# Patient Record
Sex: Female | Born: 1935
Health system: Southern US, Community
[De-identification: ages and names within clinical notes are randomized; demographics above are authoritative.]

## PROBLEM LIST (undated history)

## (undated) DIAGNOSIS — K219 Gastro-esophageal reflux disease without esophagitis: Secondary | ICD-10-CM

## (undated) DIAGNOSIS — Z9289 Personal history of other medical treatment: Secondary | ICD-10-CM

## (undated) DIAGNOSIS — E039 Hypothyroidism, unspecified: Secondary | ICD-10-CM

## (undated) DIAGNOSIS — E785 Hyperlipidemia, unspecified: Secondary | ICD-10-CM

## (undated) DIAGNOSIS — I10 Essential (primary) hypertension: Secondary | ICD-10-CM

## (undated) DIAGNOSIS — R053 Chronic cough: Secondary | ICD-10-CM

## (undated) DIAGNOSIS — I499 Cardiac arrhythmia, unspecified: Secondary | ICD-10-CM

## (undated) DIAGNOSIS — I209 Angina pectoris, unspecified: Secondary | ICD-10-CM

## (undated) DIAGNOSIS — J45909 Unspecified asthma, uncomplicated: Secondary | ICD-10-CM

## (undated) DIAGNOSIS — I480 Paroxysmal atrial fibrillation: Secondary | ICD-10-CM

## (undated) DIAGNOSIS — R05 Cough: Secondary | ICD-10-CM

## (undated) DIAGNOSIS — J189 Pneumonia, unspecified organism: Secondary | ICD-10-CM

## (undated) HISTORY — DX: Essential (primary) hypertension: I10

## (undated) HISTORY — PX: TONSILLECTOMY: SHX5217

## (undated) HISTORY — PX: FOOT SURGERY: SHX648

## (undated) HISTORY — PX: TONSILLECTOMY: SUR1361

## (undated) HISTORY — PX: THYROID SURGERY: SHX805

## (undated) HISTORY — DX: Cough: R05

## (undated) HISTORY — DX: Gastro-esophageal reflux disease without esophagitis: K21.9

## (undated) HISTORY — PX: PACEMAKER INSERTION: SHX728

## (undated) HISTORY — DX: Paroxysmal atrial fibrillation: I48.0

## (undated) HISTORY — DX: Personal history of other medical treatment: Z92.89

## (undated) HISTORY — DX: Chronic cough: R05.3

## (undated) HISTORY — PX: CHOLECYSTECTOMY: SHX55

## (undated) HISTORY — PX: NASAL SEPTUM SURGERY: SHX37

## (undated) HISTORY — DX: Hyperlipidemia, unspecified: E78.5

## (undated) HISTORY — PX: VESICOVAGINAL FISTULA CLOSURE W/ TAH: SUR271

## (undated) HISTORY — DX: Unspecified asthma, uncomplicated: J45.909

---

## 2001-12-23 ENCOUNTER — Encounter: Payer: Self-pay | Admitting: Emergency Medicine

## 2001-12-23 ENCOUNTER — Emergency Department (HOSPITAL_COMMUNITY): Admission: EM | Admit: 2001-12-23 | Discharge: 2001-12-23 | Payer: Self-pay | Admitting: Emergency Medicine

## 2004-08-03 ENCOUNTER — Ambulatory Visit: Payer: Self-pay | Admitting: Internal Medicine

## 2004-08-31 ENCOUNTER — Ambulatory Visit: Payer: Self-pay | Admitting: Internal Medicine

## 2004-09-02 ENCOUNTER — Ambulatory Visit: Payer: Self-pay | Admitting: Internal Medicine

## 2004-09-25 ENCOUNTER — Ambulatory Visit: Payer: Self-pay | Admitting: Internal Medicine

## 2004-10-15 ENCOUNTER — Emergency Department (HOSPITAL_COMMUNITY): Admission: EM | Admit: 2004-10-15 | Discharge: 2004-10-15 | Payer: Self-pay | Admitting: Emergency Medicine

## 2004-10-22 ENCOUNTER — Ambulatory Visit (HOSPITAL_COMMUNITY): Admission: RE | Admit: 2004-10-22 | Discharge: 2004-10-22 | Payer: Self-pay

## 2004-11-25 ENCOUNTER — Ambulatory Visit: Payer: Self-pay | Admitting: Internal Medicine

## 2004-12-25 ENCOUNTER — Ambulatory Visit: Payer: Self-pay | Admitting: Internal Medicine

## 2005-02-23 ENCOUNTER — Ambulatory Visit: Payer: Self-pay | Admitting: Internal Medicine

## 2005-03-23 ENCOUNTER — Ambulatory Visit: Payer: Self-pay | Admitting: Internal Medicine

## 2005-03-24 ENCOUNTER — Ambulatory Visit: Payer: Self-pay | Admitting: Internal Medicine

## 2005-03-31 ENCOUNTER — Ambulatory Visit: Payer: Self-pay | Admitting: Internal Medicine

## 2005-04-05 ENCOUNTER — Ambulatory Visit: Payer: Self-pay | Admitting: Internal Medicine

## 2005-04-08 ENCOUNTER — Ambulatory Visit: Payer: Self-pay | Admitting: Internal Medicine

## 2005-04-19 ENCOUNTER — Ambulatory Visit: Payer: Self-pay | Admitting: Internal Medicine

## 2005-04-22 ENCOUNTER — Ambulatory Visit: Payer: Self-pay | Admitting: Internal Medicine

## 2005-04-28 ENCOUNTER — Ambulatory Visit: Payer: Self-pay | Admitting: Internal Medicine

## 2005-05-05 ENCOUNTER — Ambulatory Visit: Payer: Self-pay | Admitting: Internal Medicine

## 2005-05-10 ENCOUNTER — Ambulatory Visit: Payer: Self-pay | Admitting: Internal Medicine

## 2005-05-17 ENCOUNTER — Ambulatory Visit: Payer: Self-pay | Admitting: Internal Medicine

## 2005-05-21 ENCOUNTER — Ambulatory Visit: Payer: Self-pay | Admitting: Internal Medicine

## 2005-05-24 ENCOUNTER — Ambulatory Visit: Payer: Self-pay | Admitting: Internal Medicine

## 2005-05-31 ENCOUNTER — Ambulatory Visit: Payer: Self-pay | Admitting: Internal Medicine

## 2005-06-03 ENCOUNTER — Ambulatory Visit: Payer: Self-pay | Admitting: *Deleted

## 2005-06-04 ENCOUNTER — Ambulatory Visit: Payer: Self-pay | Admitting: Internal Medicine

## 2005-06-08 ENCOUNTER — Ambulatory Visit: Payer: Self-pay | Admitting: Internal Medicine

## 2005-06-09 ENCOUNTER — Ambulatory Visit: Payer: Self-pay | Admitting: Internal Medicine

## 2005-06-11 ENCOUNTER — Ambulatory Visit: Payer: Self-pay | Admitting: Internal Medicine

## 2005-06-16 ENCOUNTER — Ambulatory Visit: Payer: Self-pay | Admitting: Internal Medicine

## 2005-06-23 ENCOUNTER — Ambulatory Visit: Payer: Self-pay | Admitting: Internal Medicine

## 2005-06-29 ENCOUNTER — Ambulatory Visit: Payer: Self-pay | Admitting: Internal Medicine

## 2005-07-02 ENCOUNTER — Ambulatory Visit: Payer: Self-pay | Admitting: Internal Medicine

## 2005-07-07 ENCOUNTER — Ambulatory Visit: Payer: Self-pay | Admitting: Internal Medicine

## 2005-07-14 ENCOUNTER — Ambulatory Visit: Payer: Self-pay | Admitting: Internal Medicine

## 2005-07-16 ENCOUNTER — Ambulatory Visit: Payer: Self-pay | Admitting: Internal Medicine

## 2005-07-21 ENCOUNTER — Ambulatory Visit: Payer: Self-pay | Admitting: Internal Medicine

## 2005-07-27 ENCOUNTER — Ambulatory Visit: Payer: Self-pay | Admitting: Internal Medicine

## 2005-08-17 ENCOUNTER — Ambulatory Visit: Payer: Self-pay | Admitting: Internal Medicine

## 2005-08-20 ENCOUNTER — Ambulatory Visit: Payer: Self-pay | Admitting: Internal Medicine

## 2005-08-25 ENCOUNTER — Ambulatory Visit: Payer: Self-pay | Admitting: Internal Medicine

## 2005-08-31 ENCOUNTER — Ambulatory Visit: Payer: Self-pay | Admitting: Internal Medicine

## 2005-09-07 ENCOUNTER — Ambulatory Visit: Payer: Self-pay | Admitting: Internal Medicine

## 2005-09-10 ENCOUNTER — Ambulatory Visit: Payer: Self-pay | Admitting: Internal Medicine

## 2005-09-14 ENCOUNTER — Ambulatory Visit: Payer: Self-pay | Admitting: Internal Medicine

## 2005-09-21 ENCOUNTER — Ambulatory Visit: Payer: Self-pay | Admitting: Internal Medicine

## 2005-09-23 ENCOUNTER — Ambulatory Visit: Payer: Self-pay | Admitting: Internal Medicine

## 2005-09-28 ENCOUNTER — Ambulatory Visit: Payer: Self-pay | Admitting: Internal Medicine

## 2005-10-01 ENCOUNTER — Ambulatory Visit: Payer: Self-pay | Admitting: Internal Medicine

## 2005-10-11 ENCOUNTER — Ambulatory Visit: Payer: Self-pay | Admitting: Internal Medicine

## 2005-11-02 ENCOUNTER — Ambulatory Visit: Payer: Self-pay | Admitting: Internal Medicine

## 2005-11-10 ENCOUNTER — Ambulatory Visit: Payer: Self-pay | Admitting: Internal Medicine

## 2005-11-17 ENCOUNTER — Ambulatory Visit: Payer: Self-pay | Admitting: Internal Medicine

## 2005-11-19 ENCOUNTER — Ambulatory Visit: Payer: Self-pay | Admitting: Internal Medicine

## 2005-11-25 ENCOUNTER — Ambulatory Visit: Payer: Self-pay | Admitting: Internal Medicine

## 2005-11-30 ENCOUNTER — Ambulatory Visit: Payer: Self-pay | Admitting: Internal Medicine

## 2005-12-03 ENCOUNTER — Ambulatory Visit: Payer: Self-pay | Admitting: Internal Medicine

## 2005-12-08 ENCOUNTER — Ambulatory Visit: Payer: Self-pay | Admitting: Internal Medicine

## 2005-12-10 ENCOUNTER — Ambulatory Visit: Payer: Self-pay | Admitting: Internal Medicine

## 2005-12-13 ENCOUNTER — Ambulatory Visit: Payer: Self-pay | Admitting: Internal Medicine

## 2005-12-17 ENCOUNTER — Ambulatory Visit: Payer: Self-pay | Admitting: Internal Medicine

## 2005-12-21 ENCOUNTER — Ambulatory Visit: Payer: Self-pay | Admitting: Internal Medicine

## 2005-12-23 ENCOUNTER — Encounter: Admission: RE | Admit: 2005-12-23 | Discharge: 2005-12-23 | Payer: Self-pay | Admitting: Obstetrics and Gynecology

## 2005-12-24 ENCOUNTER — Ambulatory Visit: Payer: Self-pay | Admitting: Internal Medicine

## 2005-12-31 ENCOUNTER — Ambulatory Visit: Payer: Self-pay | Admitting: Internal Medicine

## 2006-01-04 ENCOUNTER — Ambulatory Visit: Payer: Self-pay | Admitting: Internal Medicine

## 2006-01-10 ENCOUNTER — Ambulatory Visit: Payer: Self-pay | Admitting: Internal Medicine

## 2006-01-19 ENCOUNTER — Ambulatory Visit: Payer: Self-pay | Admitting: Internal Medicine

## 2006-01-25 ENCOUNTER — Ambulatory Visit: Payer: Self-pay | Admitting: Internal Medicine

## 2006-02-08 ENCOUNTER — Ambulatory Visit: Payer: Self-pay | Admitting: Internal Medicine

## 2006-02-15 ENCOUNTER — Ambulatory Visit: Payer: Self-pay | Admitting: Internal Medicine

## 2006-03-01 ENCOUNTER — Ambulatory Visit: Payer: Self-pay | Admitting: Internal Medicine

## 2006-03-02 ENCOUNTER — Ambulatory Visit: Payer: Self-pay | Admitting: Internal Medicine

## 2006-03-11 ENCOUNTER — Ambulatory Visit: Payer: Self-pay | Admitting: Internal Medicine

## 2006-03-16 ENCOUNTER — Ambulatory Visit: Payer: Self-pay | Admitting: Internal Medicine

## 2006-03-25 ENCOUNTER — Ambulatory Visit: Payer: Self-pay | Admitting: Internal Medicine

## 2006-03-28 ENCOUNTER — Ambulatory Visit: Payer: Self-pay | Admitting: Internal Medicine

## 2006-03-31 ENCOUNTER — Ambulatory Visit (HOSPITAL_COMMUNITY): Admission: RE | Admit: 2006-03-31 | Discharge: 2006-03-31 | Payer: Self-pay | Admitting: Internal Medicine

## 2006-04-06 ENCOUNTER — Ambulatory Visit: Payer: Self-pay | Admitting: Internal Medicine

## 2006-04-13 ENCOUNTER — Encounter: Admission: RE | Admit: 2006-04-13 | Discharge: 2006-04-13 | Payer: Self-pay | Admitting: Obstetrics and Gynecology

## 2006-04-14 ENCOUNTER — Ambulatory Visit: Payer: Self-pay | Admitting: Internal Medicine

## 2006-04-28 ENCOUNTER — Ambulatory Visit: Payer: Self-pay | Admitting: Internal Medicine

## 2006-05-05 ENCOUNTER — Ambulatory Visit: Payer: Self-pay | Admitting: Internal Medicine

## 2006-05-10 ENCOUNTER — Ambulatory Visit: Payer: Self-pay | Admitting: Internal Medicine

## 2006-05-25 ENCOUNTER — Ambulatory Visit: Payer: Self-pay | Admitting: Internal Medicine

## 2006-06-03 ENCOUNTER — Ambulatory Visit: Payer: Self-pay | Admitting: Internal Medicine

## 2006-06-10 ENCOUNTER — Ambulatory Visit: Payer: Self-pay | Admitting: Internal Medicine

## 2006-07-05 ENCOUNTER — Ambulatory Visit: Payer: Self-pay | Admitting: Internal Medicine

## 2006-07-06 ENCOUNTER — Ambulatory Visit: Payer: Self-pay | Admitting: Internal Medicine

## 2006-07-15 ENCOUNTER — Ambulatory Visit: Payer: Self-pay | Admitting: Internal Medicine

## 2006-07-28 ENCOUNTER — Ambulatory Visit: Payer: Self-pay | Admitting: Internal Medicine

## 2006-08-19 ENCOUNTER — Ambulatory Visit: Payer: Self-pay | Admitting: Internal Medicine

## 2006-09-15 ENCOUNTER — Ambulatory Visit: Payer: Self-pay | Admitting: Internal Medicine

## 2006-09-23 ENCOUNTER — Ambulatory Visit: Payer: Self-pay | Admitting: Internal Medicine

## 2006-10-14 ENCOUNTER — Ambulatory Visit: Payer: Self-pay | Admitting: Internal Medicine

## 2006-10-27 ENCOUNTER — Ambulatory Visit: Payer: Self-pay | Admitting: Internal Medicine

## 2006-11-11 ENCOUNTER — Ambulatory Visit: Payer: Self-pay | Admitting: Internal Medicine

## 2006-12-08 ENCOUNTER — Ambulatory Visit: Payer: Self-pay | Admitting: Internal Medicine

## 2006-12-29 ENCOUNTER — Ambulatory Visit: Payer: Self-pay | Admitting: Internal Medicine

## 2007-02-01 ENCOUNTER — Ambulatory Visit: Payer: Self-pay | Admitting: Internal Medicine

## 2007-03-20 ENCOUNTER — Ambulatory Visit: Payer: Self-pay | Admitting: Internal Medicine

## 2007-03-28 ENCOUNTER — Ambulatory Visit: Payer: Self-pay | Admitting: Internal Medicine

## 2007-05-12 ENCOUNTER — Ambulatory Visit: Payer: Self-pay | Admitting: Internal Medicine

## 2007-05-19 ENCOUNTER — Ambulatory Visit: Payer: Self-pay | Admitting: Internal Medicine

## 2007-05-24 ENCOUNTER — Encounter: Admission: RE | Admit: 2007-05-24 | Discharge: 2007-05-24 | Payer: Self-pay | Admitting: Endocrinology

## 2007-05-25 ENCOUNTER — Ambulatory Visit: Payer: Self-pay | Admitting: Internal Medicine

## 2007-06-20 ENCOUNTER — Ambulatory Visit: Payer: Self-pay | Admitting: Internal Medicine

## 2007-06-27 ENCOUNTER — Ambulatory Visit: Payer: Self-pay | Admitting: Internal Medicine

## 2007-07-07 ENCOUNTER — Ambulatory Visit: Payer: Self-pay | Admitting: Internal Medicine

## 2007-07-14 ENCOUNTER — Ambulatory Visit: Payer: Self-pay | Admitting: Internal Medicine

## 2007-07-21 ENCOUNTER — Ambulatory Visit: Payer: Self-pay | Admitting: Internal Medicine

## 2007-07-28 ENCOUNTER — Ambulatory Visit: Payer: Self-pay | Admitting: Internal Medicine

## 2007-07-31 ENCOUNTER — Ambulatory Visit: Payer: Self-pay | Admitting: Internal Medicine

## 2007-08-04 ENCOUNTER — Ambulatory Visit: Payer: Self-pay | Admitting: Internal Medicine

## 2007-08-18 ENCOUNTER — Ambulatory Visit: Payer: Self-pay | Admitting: Internal Medicine

## 2007-09-22 ENCOUNTER — Ambulatory Visit: Payer: Self-pay | Admitting: Internal Medicine

## 2007-10-13 ENCOUNTER — Ambulatory Visit: Payer: Self-pay | Admitting: Internal Medicine

## 2007-10-20 ENCOUNTER — Ambulatory Visit: Payer: Self-pay | Admitting: Internal Medicine

## 2007-10-26 ENCOUNTER — Encounter: Payer: Self-pay | Admitting: Internal Medicine

## 2007-10-26 DIAGNOSIS — J309 Allergic rhinitis, unspecified: Secondary | ICD-10-CM | POA: Insufficient documentation

## 2007-10-26 DIAGNOSIS — K219 Gastro-esophageal reflux disease without esophagitis: Secondary | ICD-10-CM | POA: Insufficient documentation

## 2007-10-26 DIAGNOSIS — R05 Cough: Secondary | ICD-10-CM

## 2007-10-26 DIAGNOSIS — R053 Chronic cough: Secondary | ICD-10-CM | POA: Insufficient documentation

## 2007-10-26 DIAGNOSIS — J209 Acute bronchitis, unspecified: Secondary | ICD-10-CM | POA: Insufficient documentation

## 2007-11-03 ENCOUNTER — Inpatient Hospital Stay (HOSPITAL_COMMUNITY): Admission: EM | Admit: 2007-11-03 | Discharge: 2007-11-04 | Payer: Self-pay | Admitting: Emergency Medicine

## 2007-11-17 ENCOUNTER — Ambulatory Visit: Payer: Self-pay | Admitting: Internal Medicine

## 2007-11-19 ENCOUNTER — Inpatient Hospital Stay (HOSPITAL_COMMUNITY): Admission: EM | Admit: 2007-11-19 | Discharge: 2007-11-21 | Payer: Self-pay | Admitting: Emergency Medicine

## 2007-11-20 ENCOUNTER — Encounter (INDEPENDENT_AMBULATORY_CARE_PROVIDER_SITE_OTHER): Payer: Self-pay | Admitting: Cardiology

## 2007-11-24 ENCOUNTER — Inpatient Hospital Stay (HOSPITAL_COMMUNITY): Admission: EM | Admit: 2007-11-24 | Discharge: 2007-11-28 | Payer: Self-pay | Admitting: Emergency Medicine

## 2007-11-24 ENCOUNTER — Encounter (INDEPENDENT_AMBULATORY_CARE_PROVIDER_SITE_OTHER): Payer: Self-pay | Admitting: Cardiology

## 2007-11-29 ENCOUNTER — Telehealth (INDEPENDENT_AMBULATORY_CARE_PROVIDER_SITE_OTHER): Payer: Self-pay | Admitting: *Deleted

## 2007-12-08 ENCOUNTER — Ambulatory Visit: Payer: Self-pay | Admitting: Internal Medicine

## 2007-12-18 ENCOUNTER — Ambulatory Visit: Payer: Self-pay | Admitting: Internal Medicine

## 2007-12-26 ENCOUNTER — Ambulatory Visit: Payer: Self-pay | Admitting: Internal Medicine

## 2008-01-31 ENCOUNTER — Ambulatory Visit: Payer: Self-pay | Admitting: Internal Medicine

## 2008-02-09 ENCOUNTER — Ambulatory Visit: Payer: Self-pay | Admitting: Internal Medicine

## 2008-02-16 ENCOUNTER — Ambulatory Visit: Payer: Self-pay | Admitting: Internal Medicine

## 2008-02-23 ENCOUNTER — Ambulatory Visit: Payer: Self-pay | Admitting: Internal Medicine

## 2008-03-01 ENCOUNTER — Ambulatory Visit: Payer: Self-pay | Admitting: Internal Medicine

## 2008-03-15 ENCOUNTER — Ambulatory Visit: Payer: Self-pay | Admitting: Internal Medicine

## 2008-03-29 ENCOUNTER — Ambulatory Visit: Payer: Self-pay | Admitting: Internal Medicine

## 2008-05-06 ENCOUNTER — Ambulatory Visit: Payer: Self-pay | Admitting: Internal Medicine

## 2008-05-07 ENCOUNTER — Ambulatory Visit: Payer: Self-pay | Admitting: Internal Medicine

## 2008-05-23 ENCOUNTER — Ambulatory Visit: Payer: Self-pay | Admitting: Internal Medicine

## 2008-08-02 ENCOUNTER — Ambulatory Visit: Payer: Self-pay | Admitting: Internal Medicine

## 2008-08-16 ENCOUNTER — Ambulatory Visit: Payer: Self-pay | Admitting: Internal Medicine

## 2008-08-28 ENCOUNTER — Ambulatory Visit: Payer: Self-pay | Admitting: Internal Medicine

## 2008-09-09 ENCOUNTER — Ambulatory Visit: Payer: Self-pay | Admitting: Internal Medicine

## 2008-09-30 ENCOUNTER — Ambulatory Visit: Payer: Self-pay | Admitting: Internal Medicine

## 2008-11-04 ENCOUNTER — Ambulatory Visit: Payer: Self-pay | Admitting: Internal Medicine

## 2008-11-11 ENCOUNTER — Ambulatory Visit: Payer: Self-pay | Admitting: Internal Medicine

## 2008-11-19 ENCOUNTER — Ambulatory Visit: Payer: Self-pay | Admitting: Internal Medicine

## 2008-12-13 ENCOUNTER — Ambulatory Visit: Payer: Self-pay | Admitting: Internal Medicine

## 2009-01-14 ENCOUNTER — Ambulatory Visit: Payer: Self-pay | Admitting: Internal Medicine

## 2009-01-24 ENCOUNTER — Ambulatory Visit: Payer: Self-pay | Admitting: Internal Medicine

## 2009-02-04 ENCOUNTER — Ambulatory Visit: Payer: Self-pay | Admitting: Internal Medicine

## 2009-02-12 ENCOUNTER — Ambulatory Visit: Payer: Self-pay | Admitting: Internal Medicine

## 2009-02-20 ENCOUNTER — Telehealth: Payer: Self-pay | Admitting: Internal Medicine

## 2009-02-21 ENCOUNTER — Ambulatory Visit: Payer: Self-pay | Admitting: Internal Medicine

## 2009-03-06 ENCOUNTER — Ambulatory Visit: Payer: Self-pay | Admitting: Internal Medicine

## 2009-03-31 ENCOUNTER — Encounter: Admission: RE | Admit: 2009-03-31 | Discharge: 2009-03-31 | Payer: Self-pay | Admitting: Endocrinology

## 2009-04-04 ENCOUNTER — Ambulatory Visit: Payer: Self-pay | Admitting: Internal Medicine

## 2009-04-16 ENCOUNTER — Ambulatory Visit: Payer: Self-pay | Admitting: Internal Medicine

## 2009-05-02 ENCOUNTER — Ambulatory Visit: Payer: Self-pay | Admitting: Vascular Surgery

## 2009-05-27 ENCOUNTER — Ambulatory Visit: Payer: Self-pay | Admitting: Internal Medicine

## 2009-09-10 ENCOUNTER — Ambulatory Visit: Payer: Self-pay | Admitting: Internal Medicine

## 2009-09-19 ENCOUNTER — Ambulatory Visit: Payer: Self-pay | Admitting: Internal Medicine

## 2009-10-03 ENCOUNTER — Ambulatory Visit: Payer: Self-pay | Admitting: Internal Medicine

## 2009-10-31 ENCOUNTER — Ambulatory Visit: Payer: Self-pay | Admitting: Internal Medicine

## 2009-11-14 ENCOUNTER — Ambulatory Visit: Payer: Self-pay | Admitting: Internal Medicine

## 2009-11-18 ENCOUNTER — Telehealth (INDEPENDENT_AMBULATORY_CARE_PROVIDER_SITE_OTHER): Payer: Self-pay | Admitting: *Deleted

## 2009-11-20 DIAGNOSIS — Z9289 Personal history of other medical treatment: Secondary | ICD-10-CM

## 2009-11-20 HISTORY — DX: Personal history of other medical treatment: Z92.89

## 2009-12-04 ENCOUNTER — Ambulatory Visit: Payer: Self-pay | Admitting: Internal Medicine

## 2010-01-01 ENCOUNTER — Telehealth (INDEPENDENT_AMBULATORY_CARE_PROVIDER_SITE_OTHER): Payer: Self-pay | Admitting: *Deleted

## 2010-01-02 ENCOUNTER — Ambulatory Visit: Payer: Self-pay | Admitting: Internal Medicine

## 2010-01-05 ENCOUNTER — Telehealth: Payer: Self-pay | Admitting: Internal Medicine

## 2010-01-22 ENCOUNTER — Encounter: Payer: Self-pay | Admitting: Internal Medicine

## 2010-02-11 ENCOUNTER — Ambulatory Visit: Payer: Self-pay | Admitting: Internal Medicine

## 2010-02-20 ENCOUNTER — Ambulatory Visit: Payer: Self-pay | Admitting: Internal Medicine

## 2010-02-20 DIAGNOSIS — J45909 Unspecified asthma, uncomplicated: Secondary | ICD-10-CM

## 2010-03-12 ENCOUNTER — Ambulatory Visit: Payer: Self-pay | Admitting: Internal Medicine

## 2010-04-01 ENCOUNTER — Encounter: Admission: RE | Admit: 2010-04-01 | Discharge: 2010-04-01 | Payer: Self-pay | Admitting: Endocrinology

## 2010-04-16 ENCOUNTER — Ambulatory Visit: Payer: Self-pay | Admitting: Internal Medicine

## 2010-04-27 ENCOUNTER — Ambulatory Visit: Payer: Self-pay | Admitting: Internal Medicine

## 2010-05-13 ENCOUNTER — Ambulatory Visit: Payer: Self-pay | Admitting: Internal Medicine

## 2010-05-25 ENCOUNTER — Ambulatory Visit: Payer: Self-pay | Admitting: Internal Medicine

## 2010-07-20 ENCOUNTER — Ambulatory Visit: Payer: Self-pay | Admitting: Internal Medicine

## 2010-07-23 NOTE — Letter (Signed)
Summary: [facility]   Imported By: Sherian Rein 01/30/2010 07:52:59  _____________________________________________________________________  External Attachment:    Type:   Image     Comment:   External Document

## 2010-07-23 NOTE — Progress Notes (Signed)
Summary: cough  Phone Note Call from Patient   Caller: Patient Call For: young Summary of Call: pt still have cough and its getting worse Initial call taken by: Rickard Patience,  January 01, 2010 4:04 PM  Follow-up for Phone Call        Spoke with pt.  She states that since she was last seen in May her cough has worsened progressively every day.  She states that she tried taking omeprazole two times a day as directed per CD[facility] and this did not help at all.  She states that she is currrently on dexilant daily per her PCP and this is not helping.  She has appt on 7/26 but wants to be seen sooner.  Appt sched for tommorrow at 3:30 pm. Follow-up by: Vernie Murders,  January 01, 2010 4:23 PM

## 2010-07-23 NOTE — Miscellaneous (Signed)
Summary: Orders Update pft charges  Clinical Lists Changes  Orders: Added new Service order of Carbon Monoxide diffusing w/capacity (94720) - Signed Added new Service order of Lung Volumes (94240) - Signed Added new Service order of Spirometry (Pre & Post) (94060) - Signed 

## 2010-07-23 NOTE — Progress Notes (Signed)
Summary: returned call  Phone Note Call from Patient Call back at Home Phone [phone]   Caller: Patient Call For: young Summary of Call: pt returned call from [facility] Initial call taken by: Tivis Ringer, CNA,  Nov 18, 2009 12:02 PM  Follow-up for Phone Call        pt called back to say she is at home now. Follow-up by: Eugene Gavia,  Nov 18, 2009 4:41 PM  Additional Follow-up for Phone Call Additional follow up Details #1::        Spoke with pt; aware of results and no questions or concerns.Reynaldo Minium CMA  Nov 18, 2009 4:44 PM

## 2010-07-23 NOTE — Assessment & Plan Note (Signed)
Summary: f/u results///kp   Primary Provider/Referring Provider:  Altheimer  CC:  ov to discuss spirometry results.  History of Present Illness: Nov 14, 2009- Cough, allergic rhinitis, asthma/ bronchitis, GERD............................[name] Kitchenhusband here 77 yo never smoker for 1 yr f/u. did well til 4-5 months ago. Began burping when up to bathrrom at night. In AM notes burning in throat. Much cough, scant production- never looks at it  Not clear about any association with meals and denies choke, strangle, hangup or won't go down sensation. HOB up on brick. Not more SOB and no wheeze or chest pain. Dr Altheimer gave script for acid reflux med- couldn't afford. No change with heart. Had sleep study by her heart doctor. Cough starts upper trachea level- treated w/ cough drops.  Allergy vaccine continues - she says it helps, prevented her previous Fall symptoms, and is ok.  January 02, 2010- Cough, allergic rhinitis, asthma/ bronchjitis, GERD...........[name] Kitchenhusband here Cough has been worse since last here  Family is concerned. Feels something coming up in throat as she gets up; also around 5pm and after dinner. Rarely able to actually cough anything out. Feels tickle in lower throat but never coughs up anything.  Denies sinus drip. She thinks it comes up from below,  but can't tell if it is from stomach or from lung. Continues Dexilant. Little cough at night. CXR- Reviewed with them: pacemaker, borderline heart size, but no CHF. Old calcified scarring.  February 20, 2010- Cough, allergic rhinitis, asthma/ bronchitis, GERD............[name] Kitchenhusband here Chronic cough- we gave Daliresp to try and got PFT Daliresp made nauseated and anorexic, so she quit and resumed Nexium. Not coughing as badly but still occasionally bad throat tickle sets her off. She has no theories. Is aware ragweed season has often been bad for her. Not aware of dyspnea. . Had endoscopy by Dr Evette Cristal- told her no damage from reflux. Allergy  vaccine dose is held at highest tolerated- .05 of 1:50. PFT reviewed- Mild obstruction small airways with response to dilator and mild reduction of DLCO Wants to wait till later for flu vax.      Preventive Screening-Counseling & Management  Alcohol-Tobacco     Smoking Status: never  Current Medications (verified): 1)  Adult Aspirin Low Strength 81 Mg  Tbdp (Aspirin) .... Once Daily 2)  Evista 60 Mg  Tabs (Raloxifene Hcl) .... Take 1 Tablet By Mouth Once A Day 3)  Synthroid 75 Mcg  Tabs (Levothyroxine Sodium) .[name].. 1 Tab By Mouth 3 Times A Week 4)  Vitamin D 1000 Unit  Tabs (Cholecalciferol) .... Take 1 Tablet By Mouth Once A Day 5)  Multivitamins   Tabs (Multiple Vitamin) .... Take 1 Tablet By Mouth Once A Day 6)  Cal-Mag 500-250 Mg  Tabs (Calcium-Magnesium) .... Take 1 Tablet By Mouth Once A Day 7)  Allergy Vaccine A 1:500, B 1:50(0.05) Gh .... Gh 8)  Simvastatin 20 Mg Tabs (Simvastatin) .... Take 1/2 By Mouth Once Daily 9)  Diltiazem Hcl 120 Mg Tabs (Diltiazem Hcl) .... Take 1 By Mouth Once Daily 10)  Valturna 300-320 Mg Tabs (Aliskiren-Valsartan) .... Once Daily 11)  Hydrochlorothiazide 50 Mg Tabs (Hydrochlorothiazide) .... 1/2 Tab Every Day 12)  Metoprolol Succinate 50 Mg Xr24h-Tab (Metoprolol Succinate) .... 2 By Mouth Once Daily 13)  Nexium 40 Mg Cpdr (Esomeprazole Magnesium) .... Take 1 Capsule By Mouth Once A Day Before Meal 14)  Daliresp 500 .[name] KitchenMarland [name]. 1 Daily 15)  Vitamin D (Ergocalciferol) 50000 Unit Caps (Ergocalciferol) .[name].. 1 Capsule By Mouth Every  2 Weeks  Allergies (verified): 1)  ! * Mycins 2)  ! * Advair 3)  ! Sulfa  Past History:  Past Surgical History: Last updated: 05/23/2008 Right Foot Thyroid Surgery Hysterectomy Gallbladder Tonsils Septoplasty Surgery  Family History: Last updated: 2010/03/05 Mother- died MI age 46 Father- died bladder cancer Sister- alive, hx MI  Social History: Last updated: 03/05/2010 Patient never smoked.   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  The patient denies shortness of breath with activity, shortness of breath at rest, productive cough, coughing up blood, chest pain, irregular heartbeats, acid heartburn, indigestion, loss of appetite, weight change, abdominal pain, difficulty swallowing, sore throat, tooth/dental problems, headaches, nasal congestion/difficulty breathing through nose, and sneezing.    Vital Signs:  Patient profile:   77 year old female Height:      59 inches Weight:      165.50 pounds BMI:     33.55 O2 Sat:      96 % on Room air Pulse rate:   60 / minute BP sitting:   98 / 56  (left arm) Cuff size:   regular  Vitals Entered By: Boone Master CNA/MA 03/05/2010 1:40 PM)  O2 Flow:  Room air CC: ov to discuss spirometry results Comments Medications reviewed with patient Daytime contact number verified with patient. Boone Master CNA/MA  March 05, 2010 1:39 PM    Physical Exam  Additional Exam:  General: A/Ox3; pleasant and cooperative, NAD, SKIN: no rash, lesions NODES: no lymphadenopathy HEENT: /AT, EOM- WNL, Conjuctivae- clear, PERRLA, TM-WNL, Nose- clear, Throat- clear and wnl, Mallampati  II, not red, no drainage NECK: Supple w/ fair ROM, JVD- none, normal carotid impulses w/o bruits Thyroid- , no stridor, not hoarse CHEST: Clear to P&A, unlabored. NO cough after taking deep  breath,  no spontaneous cough, wheeze or hoarseness otherwise. HEART: RRR, no m/g/r heard ABDOMEN. obese ZOX:WRUE, nl pulses, no edema  NEURO: Grossly intact to observation      Impression & Recommendations:  Problem # 1:  COUGH, CHRONIC (ICD-786.2)  For lack of a better explanation, this cough is probably multifactorial with components of reflux and of cough equivalent asthma. Dr Evette Cristal has told her she doesn't have esophageal damage. We reviewed her past lack of benefit from Spiriva. We will let her try sample [facility] for the high steroid level possible.  Problem # 2:  ALLERGIC RHINITIS (ICD-477.9)  She continues allergy vaccine and has felt it helped nasal symptoms. We discussed options. She reacts to higher doses and will hold at highest tolerated level.  Problem # 3:  ESOPHAGEAL REFLUX (ICD-530.81)  She remains on antihreflux regimine nd Nexium. Her updated medication list for this problem includes:    Nexium 40 Mg Cpdr (Esomeprazole magnesium) .[name]... Take 1 capsule by mouth once a day before meal  Medications Added to Medication List This Visit: 1)  Synthroid 75 Mcg Tabs (Levothyroxine sodium) .[name].. 1 tab by mouth 3 times a week 2)  Simvastatin 20  Mg Tabs (Simvastatin) .... Take 1/2 by mouth once daily 3)  Nexium 40 Mg Cpdr (Esomeprazole magnesium) .... Take 1 capsule by mouth once a day before meal 4)  Vitamin D (ergocalciferol) 50000 Unit Caps (Ergocalciferol) .[name].. 1 capsule by mouth every 2 weeks 5)  Dulera 200-5 Mcg/act Aero (Mometasone furo-formoterol fum) .... 2 puffs and rinse mouth, twice daily  Other Orders: Est. Patient Level IV (57846)  Patient Instructions: 1)  Please schedule a follow-up appointment in 6 months. 2)  Try sample Dulera 200-5, 2 puffs and rinse mouth, twice daily. if it helps call for script. Prescriptions: DULERA 200-5 MCG/ACT AERO (MOMETASONE FURO-FORMOTEROL FUM) 2 puffs and rinse mouth, twice daily  #1 x prn   Entered and  Authorized by:   Waymon Budge MD   Signed by:   Waymon Budge MD on 02/20/2010   Method used:   Samples Given   RxID:   [id number]    Immunization History:  Influenza Immunization History:    Influenza:  historical (02/19/2009)  Pneumovax Immunization History:    Pneumovax:  historical (06/22/2007)

## 2010-07-23 NOTE — Miscellaneous (Signed)
Summary: Injection Record / [facility]    Injection Record / [facility]    Imported By: Lennie Odor 02/20/2010 11:11:50  _____________________________________________________________________  External Attachment:    Type:   Image     Comment:   External Document

## 2010-07-23 NOTE — Assessment & Plan Note (Signed)
Summary: cough//lmr   Primary Provider/Referring Provider:  Altheimer  CC:  Cough-productive in am(doesn't bring up all the way) x 3-4 months; denies SOB, wheezing, N, V, and diarrhea..  History of Present Illness:  11/19/08- Cough, allergic rhinitis, asthma/ bronchitis Spring was no problem. her worst season is Fall. She doesn't expect Summer seasonal problems. Denies cough, wheeze, nasal congestion or drainage. Occasional local reaction with itching after allergy vaccine- not bad. Her cough has been a lot better. Denies fever, nodes, purulent discharge, heartburn or reflux, leg edema or rash except as noted. She is holding allergy vaccine Vial A 1:500 0.3, Vial B 1:50 0.5.  Nov 14, 2009- Cough, allergic rhinitis, asthma/ bronchitis, GERD............................Marland Kitchenhusband here 77 yo never smoker for 1 yr f/u. did well til 4-5 months ago. Began burping when up to bathrrom at night. In AM notes burning in throat. Much cough, scant production- never looks at it  Not clear about any association with meals and denies choke, strangle, hangup or won't go down sensation. HOB up on brick. Not more SOB and no wheeze or chest pain. Dr Altheimer gave script for acid reflux med- couldn't afford. No change with heart. Had sleep study by her heart doctor. Cough starts upper trachea level- treated w/ cough drops.  Allergy vaccine continues - she says it helps, prevented her previous Fall symptoms, and is ok.  January 02, 2010- Cough, allergic rhinitis, asthma/ bronchjitis, GERD...........Marland Kitchenhusband here Cough has been worse since last here  Family is concerned. Feels something coming up in throat as she gets up; also around 5pm and after dinner. Rarely able to actually cough anything out. Feels tickle in lower throat but never coughs up anything.  Denies sinus drip. She thinks it comes up from below,  but can't tell if it is from stomach or from lung. Continues Dexilant. Little cough at night. CXR- Reviewed with  them: pacemaker, borderline heart size, but no CHF. Old calcified scarring.    Preventive Screening-Counseling & Management  Alcohol-Tobacco     Smoking Status: never  Current Medications (verified): 1)  Nexium 40 Mg  Cpdr (Esomeprazole Magnesium) .... Once Daily 2)  Adult Aspirin Low Strength 81 Mg  Tbdp (Aspirin) .... Once Daily 3)  Evista 60 Mg  Tabs (Raloxifene Hcl) .... Take 1 Tablet By Mouth Once A Day 4)  Synthroid 75 Mcg  Tabs (Levothyroxine Sodium) .... Take 1 Tablet By Mouth Once A Day 5)  Vitamin D 1000 Unit  Tabs (Cholecalciferol) .... Take 1 Tablet By Mouth Once A Day 6)  Multivitamins   Tabs (Multiple Vitamin) .... Take 1 Tablet By Mouth Once A Day 7)  Cal-Mag 500-250 Mg  Tabs (Calcium-Magnesium) .... Take 1 Tablet By Mouth Once A Day 8)  Allergy Vaccine A 1:500, B 1:50(0.05) Gh .... Gh 9)  Simvastatin 20 Mg Tabs (Simvastatin) .... Take 1 By Mouth Once Daily 10)  Diltiazem Hcl 120 Mg Tabs (Diltiazem Hcl) .... Take 1 By Mouth Once Daily 11)  Valturna 300-320 Mg Tabs (Aliskiren-Valsartan) .... Once Daily 12)  Hydrochlorothiazide 50 Mg Tabs (Hydrochlorothiazide) .... 1/2 Tab Every Day 13)  Metoprolol Succinate 50 Mg Xr24h-Tab (Metoprolol Succinate) .... 2 By Mouth Once Daily 14)  Dexilant 60 Mg Cpdr (Dexlansoprazole) .... Take 1 By Mouth Once Daily  Allergies (verified): 1)  ! * Mycins 2)  ! * Advair 3)  ! Sulfa  Past History:  Past Medical History: Last updated: 05/23/2008 ESOPHAGEAL REFLUX (ICD-530.81) ALLERGIC RHINITIS (ICD-477.9) ASTHMATIC BRONCHITIS, ACUTE (ICD-466.0) COUGH, CHRONIC (ICD-786.2) Pacemaker-  bradycardia-Kelly/ Lynnea Ferrier  Past Surgical History: Last updated: 05/23/2008 Right Foot Thyroid Surgery Hysterectomy Gallbladder Tonsils Septoplasty Surgery  Family History: Last updated: Dec 12, 2008 Mother- died MI age 86 Father- died bladder cancer  Social History: Last updated: 12/26/2007 Patient never smoked.   Risk Factors: Smoking  Status: never (01/02/2010)  Review of Systems      See HPI       The patient complains of non-productive cough.  The patient denies shortness of breath with activity, shortness of breath at rest, productive cough, coughing up blood, chest pain, irregular heartbeats, acid heartburn, indigestion, loss of appetite, weight change, abdominal pain, difficulty swallowing, sore throat, tooth/dental problems, headaches, nasal congestion/difficulty breathing through nose, and sneezing.    Vital Signs:  Patient profile:   77 year old female Height:      59 inches Weight:      168 pounds BMI:     34.05 O2 Sat:      98 % on Room air Pulse rate:   60 / minute BP sitting:   116 / 58  (left arm) Cuff size:   regular  Vitals Entered By: Reynaldo Minium CMA (January 02, 2010 3:43 PM)  O2 Flow:  Room air CC: Cough-productive in am(doesn't bring up all the way) x 3-4 months; denies SOB, wheezing,N,V,diarrhea.   Physical Exam  Additional Exam:  General: A/Ox3; pleasant and cooperative, NAD, SKIN: no rash, lesions NODES: no lymphadenopathy HEENT: [place]/AT, EOM- WNL, Conjuctivae- clear, PERRLA, TM-WNL, Nose- clear, Throat- clear and wnl, Mallampati  II, not red, no drainage NECK: Supple w/ fair ROM, JVD- none, normal carotid impulses w/o bruits Thyroid- , no stridor, not hoarse CHEST: Clear to P&A, unlabored. Dry cough after taking deep breath, but no spontaneous cough, wheeze or hoarseness otherwise. HEART: RRR, no m/g/r heard ABDOMEN. obese EAV:WUJW, nl pulses, no edema  NEURO: Grossly intact to observation      Impression & Recommendations:  Problem # 1:  COUGH, CHRONIC (ICD-786.2)  We talked again about her cough. I think this is multifactorial. I will try Daliresp as an airway anitinflammatory. She is to continue antireflux measures. We are scheduling a PFT to update understanding of airway component.After the PFT we will look at trying standard inhalers again. Schedule PFT CXR  reviewed  Medications Added to Medication List This Visit: 1)  Metoprolol Succinate 50 Mg Xr24h-tab (Metoprolol succinate) .... 2 by mouth once daily 2)  Dexilant 60 Mg Cpdr (Dexlansoprazole) .... Take 1 by mouth once daily 3)  Daliresp 500  .Marland Kitchen.. 1 daily  Other Orders: Est. Patient Level IV (11914)  Patient Instructions: 1)  Please schedule a follow-up appointment in 2 months. 2)  Sample bottle Daliresp- one daily 3)  Schedule PFT Prescriptions: DALIRESP 500 1 daily  #1 bottle x 0   Entered and Authorized by:   Waymon Budge MD   Signed by:   Waymon Budge MD on 01/02/2010   Method used:   Samples Given   RxID:   [id number]

## 2010-07-23 NOTE — Miscellaneous (Signed)
Summary: Injection record  Injection record   Imported By: Lester Bayfield 07/03/2010 11:52:58  _____________________________________________________________________  External Attachment:    Type:   Image     Comment:   External Document

## 2010-07-23 NOTE — Assessment & Plan Note (Signed)
Summary: 12 months/apc   Primary Provider/Referring Provider:  Altheimer  CC:  Yearly follow up visit-allergies-cough productive but unsure of the color x 4 months..  History of Present Illness: 12/26/07- 77 year old woman returning for follow-up of asthmatic bronchitis with chronic cough, and history of reflux.  Husband is with her and contributes to the discussion.  Chronic cough was better for a long time.  Since her pacemaker was placed in early June, she has had persistent cough.  Again.  She is not short of breath.  Toprol dose was increased to 50 mg at the time of pacemaker insertion.  She denies reflux, here.  At her last visit, the nurse practitioner gave prednisone, which she blamed for palpitations. allergy vaccine is continued at low dose to avoid local reaction.  Spiriva helped.  05/23/08- Cough, allergic rhinitis, asthma/ bronchitis  Returns for 4 month f/u saying that she feels good with no cough or wheeze, No acute events or seasonal changes.  11/19/08- Cough, allergic rhinitis, asthma/ bronchitis Spring was no problem. her worst season is Fall. She doesn't expect Summer seasonal problems. Denies cough, wheeze, nasal congestion or drainage. Occasional local reaction with itching after allergy vaccine- not bad. Her cough has been a lot better. Denies fever, nodes, purulent discharge, heartburn or reflux, leg edema or rash except as noted. She is holding allergy vaccine Vial A 1:500 0.3, Vial B 1:50 0.5.  Nov 14, 2009- Cough, allergic rhinitis, asthma/ bronchitis, GERD............................Marland Kitchenhusband here 77 yo never smoker for 1 yr f/u. did well til 4-5 months ago. Began burping when up to bathrrom at night. In AM notes burning in throat. Much cough, scant production- never looks at it  Not clear about any association with meals and denies choke, strangle, hangup or won't go down sensation. HOB up on brick. Not more SOB and no wheeze or chest pain. Dr Altheimer gave script for acid  reflux med- couldn't afford. No change with heart. Had sleep study by her heart doctor. Cough starts upper trachea level- treated w/ cough drops.  Allergy vaccine continues - she says it helps, prevented her previous Fall symptoms, and is ok.       Preventive Screening-Counseling & Management  Alcohol-Tobacco     Smoking Status: never  Current Medications (verified): 1)  Nexium 40 Mg  Cpdr (Esomeprazole Magnesium) .... Once Daily 2)  Adult Aspirin Low Strength 81 Mg  Tbdp (Aspirin) .... Once Daily 3)  Evista 60 Mg  Tabs (Raloxifene Hcl) .... Take 1 Tablet By Mouth Once A Day 4)  Synthroid 75 Mcg  Tabs (Levothyroxine Sodium) .... Take 1 Tablet By Mouth Once A Day 5)  Vitamin D 1000 Unit  Tabs (Cholecalciferol) .... Take 1 Tablet By Mouth Once A Day 6)  Multivitamins   Tabs (Multiple Vitamin) .... Take 1 Tablet By Mouth Once A Day 7)  Cal-Mag 500-250 Mg  Tabs (Calcium-Magnesium) .... Take 1 Tablet By Mouth Once A Day 8)  Allergy Vaccine A 1:500, B 1:50(0.05) Gh .... Gh 9)  Simvastatin 20 Mg Tabs (Simvastatin) .... Take 1 By Mouth Once Daily 10)  Diltiazem Hcl 120 Mg Tabs (Diltiazem Hcl) .... Take 1 By Mouth Once Daily 11)  Valturna 300-320 Mg Tabs (Aliskiren-Valsartan) .... Once Daily 12)  Hydrochlorothiazide 50 Mg Tabs (Hydrochlorothiazide) .... 1/2 Tab Every Day 13)  Metoprolol Succinate 50 Mg Xr24h-Tab (Metoprolol Succinate) .Marland Kitchen.. 1 1/2 Tabs Daily  Allergies (verified): 1)  ! * Mycins 2)  ! * Advair 3)  ! Sulfa  Past History:  Past Medical History: Last updated: 05/23/2008 ESOPHAGEAL REFLUX (ICD-530.81) ALLERGIC RHINITIS (ICD-477.9) ASTHMATIC BRONCHITIS, ACUTE (ICD-466.0) COUGH, CHRONIC (ICD-786.2) Pacemaker- bradycardia-Kelly/ Lynnea Ferrier  Past Surgical History: Last updated: 05/23/2008 Right Foot Thyroid Surgery Hysterectomy Gallbladder Tonsils Septoplasty Surgery  Family History: Last updated: 12-Dec-2008 Mother- died MI age 34 Father- died bladder  cancer  Social History: Last updated: 12/26/2007 Patient never smoked.   Risk Factors: Smoking Status: never (11/14/2009)  Review of Systems      See HPI       The patient complains of non-productive cough, acid heartburn, and indigestion.  The patient denies shortness of breath with activity, shortness of breath at rest, productive cough, coughing up blood, chest pain, irregular heartbeats, loss of appetite, weight change, abdominal pain, difficulty swallowing, sore throat, tooth/dental problems, headaches, nasal congestion/difficulty breathing through nose, and sneezing.    Vital Signs:  Patient profile:   77 year old female Height:      59 inches Weight:      167 pounds BMI:     33.85 O2 Sat:      97 % on Room air Pulse rate:   60 / minute BP sitting:   118 / 62  (left arm) Cuff size:   regular  Vitals Entered By: Reynaldo Minium CMA (Nov 14, 2009 1:41 PM)  O2 Flow:  Room air  Physical Exam  Additional Exam:  General: A/Ox3; pleasant and cooperative, NAD, SKIN: no rash, lesions NODES: no lymphadenopathy HEENT: [place]/AT, EOM- WNL, Conjuctivae- clear, PERRLA, TM-WNL, Nose- clear, Throat- clear and wnl, Mallampati  II, not red, no drainage NECK: Supple w/ fair ROM, JVD- none, normal carotid impulses w/o bruits Thyroid- , no stridor, not hoarse CHEST: Clear to P&A, unlabored. Dry cough after taking deep breath. HEART: RRR, no m/g/r heard ABDOMEN. obese ZOX:WRUE, nl pulses, no edema  NEURO: Grossly intact to observation      Impression & Recommendations:  Problem # 1:  ALLERGIC RHINITIS (ICD-477.9)  Good control with allergy vaccine. No change required. She is at highest concentratios tolerated; 1:50, 1:500. we will hold there.  Problem # 2:  COUGH, CHRONIC (ICD-786.2)  There may have benn some seasonal allergy relation to her increase, but she describes this as more likely due to reflux, despite Nexium. Cost is an issue.. I will suggest otc therapy to take twice daily.  If it continues consider GI referral. We will get CXR.  Medications Added to Medication List This Visit: 1)  Diltiazem Hcl 120 Mg Tabs (Diltiazem hcl) .... Take 1 by mouth once daily  Other Orders: Est. Patient Level IV (45409) T-2 View CXR (71020TC)  Patient Instructions: 1)  Please schedule a follow-up appointment in 2 months. 2)  A chest x-ray has been recommended.  Your imaging study may require preauthorization.  3)  Try aggressive treatment for reflux for a month to see if it helps your cough. Try this instead of your nexium for now. 4)  omeprazole otc twice daily, before meals.

## 2010-07-23 NOTE — Progress Notes (Signed)
Summary: rx/ cough   LMTCBx2  Phone Note Call from Patient Call back at Home Phone [phone]   Caller: Patient Call For: Deejay Koppelman Summary of Call: pt says she couldn't take the samples she was given at her ov 7/15. wants rx called in for her cough. [facility] [address] ([address]).  Initial call taken by: Tivis Ringer, CNA,  January 05, 2010 4:37 PM  Follow-up for Phone Call        pt was just seen by CY on Friday 01-02-2010 and given sample of Daliresp.   LMOMTCBx1.  Arman Filter LPN  January 05, 2010 4:43 PM   lmomtcb Vernie Murders  January 06, 2010 11:13 AM   called to speak to pt about this message--she stated that she is sorry that she did not call back but she again was unable to talk about her problems..she stated that she had to go because she had somewhere she had to be.  pt stated that she will call back tomorrow if she still has questions or problems Randell Loop CMA  January 06, 2010 4:36 PM

## 2010-08-21 ENCOUNTER — Encounter: Payer: Self-pay | Admitting: Internal Medicine

## 2010-08-21 ENCOUNTER — Ambulatory Visit (INDEPENDENT_AMBULATORY_CARE_PROVIDER_SITE_OTHER): Payer: Medicare Other | Admitting: Internal Medicine

## 2010-08-21 ENCOUNTER — Ambulatory Visit (INDEPENDENT_AMBULATORY_CARE_PROVIDER_SITE_OTHER): Payer: Medicare Other

## 2010-08-21 DIAGNOSIS — J301 Allergic rhinitis due to pollen: Secondary | ICD-10-CM

## 2010-08-21 DIAGNOSIS — J209 Acute bronchitis, unspecified: Secondary | ICD-10-CM

## 2010-08-21 DIAGNOSIS — J309 Allergic rhinitis, unspecified: Secondary | ICD-10-CM

## 2010-08-21 DIAGNOSIS — R05 Cough: Secondary | ICD-10-CM

## 2010-08-27 NOTE — Assessment & Plan Note (Signed)
Summary: ALLERGY/CB  Nurse Visit   Allergies: 1)  ! * Mycins 2)  ! * Advair 3)  ! Sulfa  Orders Added: 1)  Allergy Injection (1) [16109]

## 2010-09-01 NOTE — Assessment & Plan Note (Signed)
Summary: 6 month follow up   Primary Provider/Referring Provider:  Altheimer  CC:  6 month follow up- yellow in color..  History of Present Illness:  January 02, 2010- Cough, allergic rhinitis, asthma/ bronchjitis, GERD...........Marland Kitchenhusband here Cough has been worse since last here  Family is concerned. Feels something coming up in throat as she gets up; also around 5pm and after dinner. Rarely able to actually cough anything out. Feels tickle in lower throat but never coughs up anything.  Denies sinus drip. She thinks it comes up from below,  but can't tell if it is from stomach or from lung. Continues Dexilant. Little cough at night. CXR- Reviewed with them: pacemaker, borderline heart size, but no CHF. Old calcified scarring.  February 20, 2010- Cough, allergic rhinitis, asthma/ bronchitis, GERD............Marland Kitchenhusband here Chronic cough- we gave Daliresp to try and got PFT Daliresp made nauseated and anorexic, so she quit and resumed Nexium. Not coughing as badly but still occasionally bad throat tickle sets her off. She has no theories. Is aware ragweed season has often been bad for her. Not aware of dyspnea. . Had endoscopy by Dr Evette Cristal- told her no damage from reflux. Allergy vaccine dose is held at highest tolerated- .05 of 1:50. PFT reviewed- Mild obstruction small airways with response to dilator and mild reduction of DLCO Wants to wait till later for flu vax.  August 21, 2010- Cough, allergic rhinitis, asthma/ bronchitis, GERD Nurse-CC: 6 month follow up- yellow in color. Allergy vaccine 1:500 held to tolerated level due to local reactions. She describes persistent chest congestion after a cold at Christmas. Stressed with repeated illness of husband. She is  slowly  better. Throat tickle cough and runny nose, scant yellow, no fever.     Asthma History    Initial Asthma Severity Rating:    Age range: 12+ years    Symptoms: >2 days/week; not daily    Nighttime Awakenings: 0-2/month   Interferes w/ normal activity: no limitations    SABA use (not for EIB): 0-2 days/week    Asthma Severity Assessment: Mild Persistent   Preventive Screening-Counseling & Management  Alcohol-Tobacco     Smoking Status: never  Current Medications (verified): 1)  Adult Aspirin Low Strength 81 Mg  Tbdp (Aspirin) .... Once Daily 2)  Evista 60 Mg  Tabs (Raloxifene Hcl) .... Take 1 Tablet By Mouth Once A Day 3)  Synthroid 75 Mcg  Tabs (Levothyroxine Sodium) .Marland Kitchen.. 1 Tab By Mouth 2  Times A Week 4)  Vitamin D 1000 Unit  Tabs (Cholecalciferol) .... Take 1 Tablet By Mouth Once A Day 5)  Multivitamins   Tabs (Multiple Vitamin) .... Take 1 Tablet By Mouth Once A Day 6)  Cal-Mag 500-250 Mg  Tabs (Calcium-Magnesium) .... Take 1 Tablet By Mouth Once A Day 7)  Allergy Vaccine A 1:500, B 1:50(0.05) Gh .... Gh 8)  Simvastatin 20 Mg Tabs (Simvastatin) .... Take 1/2 By Mouth Once Daily 9)  Diltiazem Hcl 120 Mg Tabs (Diltiazem Hcl) .... Take 1 By Mouth Once Daily 10)  Valturna 300-320 Mg Tabs (Aliskiren-Valsartan) .... Once Daily 11)  Hydrochlorothiazide 50 Mg Tabs (Hydrochlorothiazide) .... 1/2 Tab Every Day 12)  Metoprolol Succinate 50 Mg Xr24h-Tab (Metoprolol Succinate) .... 1.5  By Mouth Once Daily 13)  Nexium 40 Mg Cpdr (Esomeprazole Magnesium) .... Take 1 Capsule By Mouth Once A Day Before Meal 14)  Vitamin D (Ergocalciferol) 50000 Unit Caps (Ergocalciferol) .Marland Kitchen.. 1 Capsule By Mouth Every 2 Weeks  Allergies (verified): 1)  ! *  Mycins 2)  ! * Advair 3)  ! Sulfa  Past History:  Past Medical History: Last updated: 2010-02-23 ESOPHAGEAL REFLUX (ICD-530.81) ALLERGIC RHINITIS (ICD-477.9) ASTHMATIC BRONCHITIS, ACUTE (ICD-466.0) COUGH, CHRONIC (ICD-786.2) Cough equivalent asthma Pacemaker- bradycardia-Kelly/ Lynnea Ferrier  Past Surgical History: Last updated: 05/23/2008 Right Foot Thyroid Surgery Hysterectomy Gallbladder Tonsils Septoplasty Surgery  Family History: Last updated:  02/23/2010 Mother- died MI age 16 Father- died bladder cancer Sister- alive, hx MI  Social History: Last updated: 23-Feb-2010 Patient never smoked.  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  The patient denies shortness of breath at rest, coughing up blood, chest pain, irregular heartbeats, acid heartburn, indigestion, loss of appetite, weight change, abdominal pain, difficulty swallowing, sore throat, tooth/dental problems, headaches, nasal congestion/difficulty breathing through nose, and sneezing.    Vital Signs:  Patient profile:   77 year old female Height:      59 inches Weight:      163.38 pounds BMI:     33.12 O2 Sat:      100 % on Room air Pulse rate:   60 / minute BP sitting:   132 / 54  (right arm) Cuff size:   regular  Vitals Entered By: Reynaldo Minium CMA (August 21, 2010 1:35 PM)  O2 Flow:  Room air CC: 6 month follow up- yellow in color.   Physical Exam  Additional Exam:  General: A/Ox3; pleasant and cooperative, NAD, SKIN: no rash, lesions NODES: no lymphadenopathy HEENT: Claude/AT, EOM- WNL, Conjuctivae- clear, PERRLA, TM-WNL, Nose- clear, Throat- clear and wnl, Mallampati  II, not red, no drainage NECK: Supple w/ fair ROM, JVD- none, normal carotid impulses w/o bruits Thyroid- , no stridor, not hoarse CHEST: Clear to P&A, raspy cough HEART: RRR, no m/g/r heard ABDOMEN. obese WJX:BJYN, nl pulses, no edema  NEURO: Grossly intact to observation      Impression & Recommendations:  Problem # 1:  ASTHMATIC BRONCHITIS, ACUTE (ICD-466.0)  Recent viral pattern exacerbation, now easing slowly. i don't think CXR will help [place]. I will suggest Z pak to also provide some antiinflammatory help. The following medications were removed from the medication list:    Dulera 200-5  Mcg/act Aero (Mometasone furo-formoterol fum) .Marland Kitchen... 2 puffs and rinse mouth, twice daily Her updated medication list for this problem includes:    Zithromax Z-pak 250 Mg Tabs (Azithromycin) .Marland Kitchen... 2 today then one daily  Problem # 2:  COUGH, CHRONIC (ICD-786.2)  We will try to calm her airway with steroid injection. Discussed.   Problem # 3:  ALLERGIC RHINITIS (ICD-477.9)  She continues allergy vaccine at tolerated levels. We discussed when to decide to skip vaccine during an acute febrile illness.   Orders: Est. Patient Level III (82956)  Medications Added to Medication List This Visit: 1)  Synthroid 75 Mcg Tabs (Levothyroxine sodium) .Marland Kitchen.. 1 tab by mouth 2  times a week 2)  Metoprolol Succinate 50 Mg Xr24h-tab (Metoprolol succinate) .... 1.5  by mouth once daily 3)  Zithromax Z-pak 250 Mg Tabs (Azithromycin) .... 2 today then one daily  Other Orders: Prescription Created Electronically [id number]) Admin of Therapeutic Inj  intramuscular or subcutaneous (65784) Depo- Medrol 80mg  (J1040)  Patient Instructions: 1)  Please schedule a follow-up appointment in 6 months. 2)  Ok to continue your allergy shots 3)  Z pak  4)  Depo 80  Prescriptions: ZITHROMAX Z-PAK 250 MG TABS (AZITHROMYCIN) 2 today then one daily  #1 pak x 0   Entered and Authorized by:   Waymon Budge MD   Signed by:   Waymon Budge MD on 08/21/2010   Method used:   Electronically to        [facility] # [phone]* (retail)       [address]       [place], [place]  [address]       Ph: [phone]       Fax: [phone]   RxID:   [id number]      Medication Administration  Injection # 1:    Medication: Depo- Medrol 80mg     Diagnosis: ASTHMATIC BRONCHITIS, ACUTE (ICD-466.0)    Route: SQ    Site: RUOQ gluteus    Exp Date: 07/204    Lot #: [id number]    Mfr: Pharmacia    Patient tolerated injection without complications    Given by: Reynaldo Minium CMA (August 21, 2010 2:13 PM)  Orders  Added: 1)  Est. Patient Level III [24401] 2)  Prescription Created Electronically [[id number]] 3)  Admin of Therapeutic Inj  intramuscular or subcutaneous [96372] 4)  Depo- Medrol 80mg  [J1040]

## 2010-10-13 ENCOUNTER — Ambulatory Visit (INDEPENDENT_AMBULATORY_CARE_PROVIDER_SITE_OTHER): Payer: Medicare Other

## 2010-10-13 DIAGNOSIS — J309 Allergic rhinitis, unspecified: Secondary | ICD-10-CM

## 2010-11-02 ENCOUNTER — Telehealth: Payer: Self-pay | Admitting: Internal Medicine

## 2010-11-02 MED ORDER — AZITHROMYCIN 250 MG PO TABS: ORAL_TABLET | ORAL | Status: AC

## 2010-11-02 NOTE — Telephone Encounter (Signed)
Zpack has been called to pharm.  LMOMTCB.

## 2010-11-02 NOTE — Telephone Encounter (Signed)
Spoke w/ pt and she states she has had a lot of chest congestion, cough and is bringing up phlem but not sure what color, has sore chest x 2 weeks. Pt states she had a fever int he beginning but does not now. Pt has been taking robitussin OTC but has had no relief. Pt states her husband passed away 4 weeks ago w/ MRSA and PNA. Pt was wanting to come in and be evaluated but had no available openings. Pt states if we call her and she is not at home okay to leave detailed VM on her home phone. Please advise Dr. Maple Hudson. Thanks  Allergies  Allergen Reactions  . Advair Hfa   . Sulfonamide Derivatives     Carver Fila, CMA

## 2010-11-02 NOTE — Telephone Encounter (Signed)
Per CY-suggest Zpak #1 take as directed no refills; Mucinex OTC, if no better then call back.Vivianne Spence

## 2010-11-02 NOTE — Telephone Encounter (Signed)
Pt aware of CDY recs and will call for OV if her sxs do not improve.

## 2010-11-03 NOTE — Discharge Summary (Signed)
[name], [name] NO.:  [id number]   MEDICAL RECORD NO.:  [id number]          PATIENT TYPE:  INP   LOCATION:  2001                         FACILITY:  [facility]   PHYSICIAN:  Nicki Guadalajara, M.D.     DATE OF BIRTH:  July 08, 1931   DATE OF ADMISSION:  11/24/2007  DATE OF DISCHARGE:  11/28/2007                               DISCHARGE SUMMARY   DISCHARGE DIAGNOSES:  1. Complete heart block, symptomatic with near syncope secondary to      the pacemaker lead dislodgement.  2. Sick sinus syndrome with recent complete heart block leading to      permanent transvenous pacemaker, St. Jude XL DR placed on November 20, 2007.  Also with tachycardia at home.  3. History of normal coronary arteries and normal ejection fraction.  4. Abnormal LFTs with holding her simvastatin now resolved.  5. Shortness of breath secondary to volume overload, improved.  6. Fever, possible mild pacer site inflammation, treated with IV      vancomycin and doxycycline as an outpatient.  7. Moderate mitral regurgitation.   DISCHARGE CONDITION:  Improved.   PROCEDURES:  On November 24, 2007, explantation of RV lead and implantation  of screw lead by Dr. Lynnea Ferrier.  The approach was right subclavian area.   DISCHARGE CONDITION:  Improved.   DISCHARGE MEDICATIONS:  1. Nexium 40 mg daily as before.  2. Synthroid 75 mcg daily as before.  3. Calcium plus D daily as before.  4. Toprol-XL 50 mg daily, new dose.  5. Diovan HCT 80/25 daily which is a new dose.  6. Evista 60 mg daily.  7. Aspirin 81 mg daily.  8. Doxycycline 100 mg 1 twice a day for 4 days.  9. No Norvasc and no simvastatin for now.   DISCHARGE INSTRUCTIONS:  1. See pacemaker instruction sheet.  2. Low-sodium heart-healthy diet.  3. Increase activity slowly.  No showers until December 01, 2007.  No      driving until December 01, 2007.   HISTORY OF PRESENT ILLNESS:  [name] is a 77 year old patient  of Dr. Nicki Guadalajara who had just been  discharged to home on November 21, 2007  after presenting with a complete heart block and syncope ended up with a  permanent transvenous pacemaker.  She was discharged and initially the  first day, she felt fine.  On November 22, 2007, she had rapid heart rate for  short time.  She called Dr. Adelene Idler office.  They interrogated her,  but did not adjust her meds and she has not felt well since that time,  but on the morning of admission, November 24, 2007, she was up at 5:00  helping her husband to void, following this she would faint.  She could  hear a heartbeat in her head and realized that it was slow.  She called  her family.  They brought her in the emergency room.  She was found to  be in complete heart block with a heart rate of 30s, around 36 with non-  capture pacing spikes.  The pacer wrap for St. Jude interrogated the  pacer.  The RV lead seemed to be micro dislodged, her MA volts were  increased to 7.5 volts and the patient was pacing at 9, but she did  develop chest pain with the pacing at such a high voltage, so she was  given morphine to control that.  She also had a 2D echo that essentially  was the same, EF was 60 and there was no pericardial effusion, which was  one of the concerns with her chest pain.   PAST MEDICAL HISTORY:  As stated with her cath, that she had done on Nov 19, 2007.  She had normal coronaries and normal LV function.  She did  have moderate MR, she has hypertension though at discharge she was more  hypotensive.  Her Norvasc and Diovan HCTZ had been stopped.  She also  has dyslipidemia, but she had abnormal LFTs and they stopped her  simvastatin.  She also has treated white hypothyroidism.   ALLERGIES:  MYCINS and SULFA.   FAMILY HISTORY:  See H&P.   SOCIAL HISTORY:  See H&P.   REVIEW OF SYSTEMS:  See H&P.   PHYSICAL EXAMINATION ON DISCHARGE:  VITAL SIGNS:  Blood pressure 105/60,  pulse 66, respirations 20, temperature 97.4, and room air oxygen  saturation  97%.  GENERAL:  Alert and oriented female.  LUNGS:  Clear.  HEART:  S1 and S2.  Regular rate and rhythm with a 2/6 systolic murmur.  Pacing site; no erythema, no drainage.   LABORATORY VALUES:  On admission hemoglobin 10.9, hematocrit 31.6, WBC  9, and platelets 166.  Hemoglobin did drop to a low of 9.7, hematocrit  28, WBC 8, and platelets 140.  She had fever at one point, hemoglobin  10.8, hematocrit 31.6, and white count went to 10.6, but prior to the  day of discharge hemoglobin 9.7, hematocrit 28.5, WBC 8.7, and platelets  183.  Chemistries:  Sodium 139, potassium 3.8, chloride 104, CO2 of 27,  BUN 16, creatinine 0.96, and glucose 154.  These were all stable  throughout hospitalization.  Coags were normal.  Protime 12.7, INR of  0.9, PTT 28.  GI:  Initial LFTs; AST was 77, ALT was 85, alkaline  phosphatase 75 and at discharge, AST 21, ALT 29, alkaline phosphatase  61, total bili 0.4.  Cardiac marker done on admission:  CK 34, MB 1, and  troponin I 0.02.  UA was clear and magnesium was 2.1 on admission.   Chest x-ray:  Initial chest x-ray, cardiomegaly without evidence of  acute cardiopulmonary disease.  Followup on the November 25, 2007, permanent  pacemaker remains.  No pneumothorax.  No effusion or infection and chest  x-ray on November 26, 2007, permanent pacer.  No pneumothorax.   EKGs, initially complete heart block with pacing spike since then she  had been pacing without problems.   HOSPITAL COURSE:  The patient was admitted in complete heart block due  to micro dislodgement of her RV lead.  She was taken to the cath lab on  the day of arrival due to her symptoms of near syncope and underwent  replacement of that lead it was explanted and a new pacer lead was  implanted.  She did well.  When she got to the floor, she did develop a  fever of up to 101 and mild elevated white count.  She was started on IV  vancomycin.  The  site actually was well without problems.  She continued to  improve.  She  ambulated without problems.  Her platelets dropped to 140 at point, but  they were normal at discharge and after her last dose of vancomycin on  November 28, 2007, she was discharged and will be on doxycycline for 4 days.  She will follow up with Dr. Lynnea Ferrier.      Darcella Gasman. Annie Paras, N.P.    ______________________________  Nicki Guadalajara, M.D.    LRI/MEDQ  D:  11/28/2007  T:  11/29/2007  Job:  [id number]   cc:   Ritta Slot, MD  Veverly Fells. Altheimer, M.D.

## 2010-11-03 NOTE — Assessment & Plan Note (Signed)
[facility]                             PULMONARY OFFICE NOTE   NAME:PARRISHXiao, Graul                        MRN:          [id number]  DATE:10/27/2006                            DOB:          1932/02/19    PROBLEM LIST:  1. Chronic cough.  2. Asthmatic bronchitis.  3. Allergic rhinitis.  4. Esophageal reflux.   HISTORY:  Six month followup. She says cough is virtually gone lots  better. The pollen has not bothered her badly this spring but she  expects more trouble from ragweed in the fall. She continues vaccine now  splitting vials with vial A at 1:500, vial B at 1:50 and this has worked  out well for her. It seems to contribute to her improvement. Her husband  has had some serious health problems and she is stressed and consumed by  that.   MEDICATIONS:  1. Nexium 40 mg.  2. Diovan 160/25.  3. Toprol XL 25.  4. Aspirin 81 mg.  5. Evista.  6. Vitamin D.  7. Allergy vaccine.  8. Albuterol inhaler.   DRUG INTOLERANCE:  ADVAIR and MYCINS.   OBJECTIVE:  VITAL SIGNS:  Weight 165 pounds, blood pressure 130/64,  pulse 71, room air saturation 99%.  GENERAL:  She looks comfortable.  HEENT:  Eyes, nose and throat are clear.  CHEST:  Quiet with no rales, wheezes or rhonchi.  HEART:  Sounds irregular without murmur.   IMPRESSION:  Good control of asthmatic bronchitis and rhinitis.  Multifactorial cough is essentially gone at this time and hopefully that  improvement is lasting.   PLAN:  Schedule return in 1 year, earlier p.r.n.     Clinton D. Maple Hudson, MD, Tonny Bollman, FACP  Electronically Signed    CDY/MedQ  DD: 10/27/2006  DT: 10/28/2006  Job #: [id number]   cc:   Veverly Fells. Altheimer, M.D.

## 2010-11-03 NOTE — H&P (Signed)
NAME:  [name], [name] NO.:  [id number]   MEDICAL RECORD NO.:  [id number]          PATIENT TYPE:  EMS   LOCATION:  MAJO                         FACILITY:  [facility]   PHYSICIAN:  Alfonse Alpers. Gegick, M.D.DATE OF BIRTH:  21-Aug-1931   DATE OF ADMISSION:  11/03/2007  DATE OF DISCHARGE:                              HISTORY & PHYSICAL   CHIEF COMPLAINT:  This is a 77 year old woman who presents with a  history of syncope.   HISTORY OF PRESENT ILLNESS:  The patient has been in good health in the  past.  She has had no serious diseases and she presents today with an  episode of syncope while she was sitting.  However, it is to be noted  that during this period of time and preceding this period of time she  has had significant stress with her husband requiring some type of  surgical procedure.  She felt well after the episode of syncope.  There  is no history of trauma with the syncopal episode and she did not bite  her tongue or lose urinary incontinence.  She is now completely  asymptomatic.   She has a history of hypertension for which she is taking Lo/Ovral and  Lopressor and a history of hypothyroidism surgically induced and she is  taking thyroid replacement, the dose of which is not known.   In the emergency room, she had a few cells and bacteria in the urine and  leukocyte esterase was small.  She has not had any symptoms of dysuria.   PAST MEDICAL HISTORY:  Essentially negative.  She had a hysterectomy in  the past and has also had a cholecystectomy.   MEDICATIONS:  Prior to this admission include:  1. Diovan.  2. Lopressor.  3. Aspirin.  4. Nexium.  5. Synthroid.  6. Norvasc.  7. Zocor.  8. Evista.  The doses are not known by the patient.   FAMILY HISTORY:  Noncontributory.   PERSONAL HISTORY:  She does not smoke or drink excessive amounts of  alcohol.   ALLERGIES:  She is not allergic to any medications.   REVIEW OF SYSTEMS:  VITAL SIGNS:  Her  weight has been stable.  CARDIOVASCULAR AND RESPIRATORY:  No chest pain or shortness of breath  associated with this episode.  GI: No complaints.  GU: No complaints as noted above.   PHYSICAL EXAMINATION:  GENERAL:  This a well-developed woman who appears  in no distress.  She appears quite comfort.  HEAD:  Normocephalic without trauma.  NECK:  Supple.  No bruits are heard.  LUNGS:  Clear.  CARDIOVASCULAR:  Rhythm is regular.  Of note, her electrocardiogram  showed a left bundle branch block.  ABDOMEN:  Soft.  No masses are present.  BREASTS:  Negative.  EXTREMITIES:  No edema is present.  NEUROMUSCULAR:  She moves all four extremities.  NEUROLOGICAL:  Completely intact.   IMPRESSION:  1. Syncopal episode etiology to be determined.  2. History of hypertension.  3. History of hypothyroidism.  4. Dyslipidemia.           ______________________________  Alfonse Alpers. Dagoberto Ligas, M.D.     CGG/MEDQ  D:  11/03/2007  T:  11/04/2007  Job:  [id number]

## 2010-11-03 NOTE — Discharge Summary (Signed)
[name], [name] NO.:  [id number]   MEDICAL RECORD NO.:  [id number]          PATIENT TYPE:  INP   LOCATION:  6529                         FACILITY:  [facility]   PHYSICIAN:  Nicki Guadalajara, M.D.     DATE OF BIRTH:  01-Sep-1931   DATE OF ADMISSION:  11/19/2007  DATE OF DISCHARGE:  11/21/2007                               DISCHARGE SUMMARY   DISCHARGE DIAGNOSES:  1. Complete heart block with symptoms of fatigue, weakness, and near      syncope, status post St. Jude pacemaker this admission by Dr.      Aleen Campi.  2. Normal coronaries and normal left ventricular function by      catheterization this admission with moderate mitral regurgitation.  3. Treated hypertension, somewhat hypotensive at discharge.  4. History of dyslipidemia, statin was stopped this admission because      of complaints of vague mental haziness and myalgias for two      months.  5. Treated hypothyroidism.   HOSPITAL COURSE:  The patient is a 77 year old female who has been  followed in the past by Dr. Tresa Endo.  She has treated hypothyroidism,  dyslipidemia, and hypertension.  She has been having a couple weeks of  significant weakness.  She admits for a couple months she has had mental  haziness and some diffuse myalgias.  On the day of admission, she was  near-syncopal.  In the ER, she had complete heart block at 30.  She was  admitted to telemetry and ruled out for an MI.  She was cathed by Dr.  Elsie Lincoln on Nov 19, 2007, which revealed normal coronaries and normal LV  function.  She did have moderate MR by cath.  The patient was set up for  permanent pacemaker implant, which was done by Dr. Aleen Campi.  She  tolerated this well.  She had a St. Jude device planted.  We feel she  can be discharged on November 21, 2007.  Dr. Clarene Duke feels we should keep her  off her statin for now, she also has elevated LFTs.  Her blood pressure  has been high, but during this hospitalization it has been on the low  side,  and because of her fatigue, we will hold some of her  antihypertensives until we see her back in the office in a week.  At  that time, some of her antihypertensives may need to be resumed.   DISCHARGE MEDICATIONS:  1. Evista 60 mg a day.  2. Nexium 40 mg a day.  3. Metoprolol 25 mg a day.  4. Synthroid 0.075 mg a day.  5. Aspirin 81 mg a day.  6. Calcium and magnesium.   LABORATORY DATA:  White count 8.6, hemoglobin 10.8, hematocrit 30.8, and  platelets 155.  Sodium 141, potassium 3.8, BUN 14, and creatinine 0.8.  Liver functions were elevated on Nov 19, 2007; AST was 129, ALT 133.  Troponin on November 20, 2007 was 0.12.  CK MBs are negative.  Other  troponins were negative.  BNP 72.  TSH 1.77.  Chest x-ray showed  cardiomegaly and pulmonary vascular  congestion on Nov 19, 2007.  Telemetry showed paced rhythm.   DISPOSITION:  The patient will be discharged pending her chest x-ray  report.  For now, we will leave her off her statin, she has elevated  LFTs, myalgias, and possibly some mild cognitive changes.  We will also  leave her off some of her antihypertensives, specifically her Diovan,  HCTZ, and Norvasc, as she is borderline hypotensive at discharge.  We  will see her back in the office in a week for a site check.  She will  need a pacer check in 3 months.      [name], P.A.    ______________________________  Nicki Guadalajara, M.D.    [name]  D:  11/21/2007  T:  11/22/2007  Job:  [id number]   cc:   Antionette Char, MD

## 2010-11-03 NOTE — Discharge Summary (Signed)
[name], [name]               ACCOUNT NO.:  [id number]   MEDICAL RECORD NO.:  [id number]          PATIENT TYPE:  INP   LOCATION:  4741                         FACILITY:  [facility]   PHYSICIAN:  [name] Alpers. Gegick, M.D.DATE OF BIRTH:  22-Nov-1931   DATE OF ADMISSION:  11/03/2007  DATE OF DISCHARGE:  11/04/2007                               DISCHARGE SUMMARY   HISTORY:  This is a 77 year old woman who has been in her usual state of  good health.  She presents now with a syncopal episode.  She has been  under stress.  Her husband apparently is having some surgical procedure  and this has been stressful for her.  She was sitting and she had an  episode where she had a syncopal episode whereby she was unable to keep  herself from falling.  She does lose consciousness very briefly.  There  was no chewing of the tongue nor was there any urinary incontinence.  She felt well immediately after the episode and did not feel weak.  She  presents now with this history.  She denies any history of chest pain or  shortness of breath.   She also has a history of hypothyroidism which has been compensated and  a history of dyslipidemia has been present.   MEDICATIONS PRIOR TO THIS ADMISSION:  Diovan, Lopressor, aspirin,  Nexium, Norvasc, Zocor and Evista.   PHYSICAL EXAMINATION:  Revealed a well-developed woman who appeared  clinically stable.  Her lungs were clear.  Cardiovascular examination was normal.  Her abdomen was soft and remanded physical examination was essentially  negative.   IMPRESSION ON ADMISSION:  1. Syncopal episode.  2. Hypothyroidism which is compensated.  3. History of dyslipidemia.   HOSPITAL COURSE:  She was placed on telemetry and observed overnight and  she remained in normal sinus rhythm.  She is essentially asymptomatic at  this time.  She will now be discharged.   IMPRESSION O DISCHARGE:  1. Syncopal episode.  2. Hypothyroidism which is compensated.  3. History of  dyslipidemia.   MEDICATIONS ON DISCHARGE:  She will continue taking the same dosages of  medication which include Diovan, Lopressor, aspirin, Nexium, Norvasc,  Zocor and Evista.   DIET ON DISCHARGE:  As tolerated.   PLAN FOR FOLLOW-UP:  She is to call Dr. Leslie [name] at the beginning of  the week for further evaluation.           ______________________________  [name], M.D.     CGG/MEDQ  D:  11/04/2007  T:  11/05/2007  Job:  [id number]

## 2010-11-03 NOTE — Op Note (Signed)
[name], [name] NO.:  [id number]   MEDICAL RECORD NO.:  [id number]          PATIENT TYPE:  INP   LOCATION:  2001                         FACILITY:  [facility]   PHYSICIAN:  Ritta Slot, MD     DATE OF BIRTH:  [birth year]   DATE OF PROCEDURE:  11/24/2007  DATE OF DISCHARGE:                               OPERATIVE REPORT   PROCEDURE PERFORMED:  1. Revision of a ventricular pacing lead, St. Secretary/administrator.  2. Explantation of old ventricular St. Jude lead, model A7195716      serial [id number].   INDICATIONS:  [name] is a pleasant 77 year old Caucasian female who  was admitted 6 days ago with complete heart blocks.  She underwent a  dual-chamber pacemaker placement with a Zephyr generator, model # Zephyr  XL DR [id number], serial [id number] with passive atrial and ventricular leads,  atrial lead was model [phone] with serial #[id number] and the ventricular  lead was model #1646T, serial #[id number].  She was found today to being  complete heart block with increase threshold for capturing in the RV and  it was felt that her RV lead had become dislodged.  It was therefore  decided to reexplore the pacemaker pocket and implant a new active  fixation ventricular lead for best attachment to the RV.   DESCRIPTION OF OPERATION:  After informed consent, the patient was  brought to the Cardiac Cath Lab, where the right chest was prepped and  draped in a sterile fashion.  ECG monitoring was established.  Lidocaine  1% was then used to anesthetize the left mid-subclavicular area.  She  was given 25 mcg of fentanyl and 2 mg of Versed for IV sedation.  Next,  approximately 3 cm horizontal mid-infraclavicular incision was then  carried out at the original incision site.  Hemostasis was obtained with  cautery.  Blunt dissection was then used to carry this down to pectoral  fascia.  The pacemaker generator Zephyr XL DR serial [id number] was then  removed out of the pocket with it  being remained attached to both the  atrial and ventricular leads.  The right subclavian vein was then easily  entered with one retaining guidewire in the right subclavian vein.  Over  the retained guidewire, a 7-French dilator was then passed easily and  the dilation guidewire removed.  Through this, a 52-cm St. Jude lead  model [phone] serial [id number] was then passed easily into the right  atrium.  The Peel-Away sheath was then removed.  A J-curve was then  placed in the ventricular lead stylet and ventricular lead was allowed  to prolapse, the tricuspid valve in position of the RV apex without  difficulty.  Thresholds were determined.  We were able to capture 2  volts with extended R waves and screws were then extended and thresholds  determined.  R-waves were finally measured at 4.7 mV.  Impedance was 483  ohms.  Threshold in the ventricle was found to be 0.4 volts at 0.5  milliseconds.  Current was 0.7 mA.  Ten volts was negative for  diaphragmatic stimulation.  The  leads were then sutured into place with  2-0 silk suture per lead, anchoring these to the pectoral fascia.  The  atrial lead was then reassessed and found to have a sensing P-waves of  2.3 mV, threshold 0.75 volts at 0.5 milliseconds.  The impedance was 436  ohms.  The pocket was then copiously irrigated with 1% kanamycin  solution and again hemostasis was confirmed.  The leads were connected  in serial fashion to the [facility]. Jude Zephyr XL DR model [phone] serial  [id number] generator and head screws were tightened and pacing was  confirmed.  A single suture was placed in the superior aspect of the  pocket.  The leads and generators were then delivered into the pocket.  The header was then secured in silk suture.  The subcutaneous layer was  then closed with 2-0 Vicryl and the skin was then closed with 4-0  Vicryl.  Steri-Strips were then applied.  The patient was transferred to  the recovery room in stable condition.    CONCLUSIONS:  Successful revision of a St. Jude RV lead with  implantation of a new RV tendril lead #1688TC serial #[id number] active  fixation lead with explantation of the St. Jude Isoflex ventricular  lead, model #1646T, serial [id number].      Ritta Slot, MD  Electronically Signed     HS/MEDQ  D:  11/24/2007  T:  11/25/2007  Job:  [id number]

## 2010-11-03 NOTE — Procedures (Signed)
CAROTID DUPLEX EXAM   INDICATION:  Pulsatile tinnitus.   HISTORY:  Diabetes:  No.  Cardiac:  No.  Hypertension:  Yes.  Smoking:  No.  Previous Surgery:  No.  CV History:  Asymptomatic.  Amaurosis Fugax No, Paresthesias No, Hemiparesis No.                                       RIGHT             LEFT  Brachial systolic pressure:         121               120  Brachial Doppler waveforms:         Triphasic         Triphasic  Vertebral direction of flow:        Antegrade         Antegrade  DUPLEX VELOCITIES (cm/sec)  CCA peak systolic                   117               61  ECA peak systolic                   134               69  ICA peak systolic                   80                86  ICA end diastolic                   26                24  PLAQUE MORPHOLOGY:                  Mixed             Mixed  PLAQUE AMOUNT:                      Mild              Mild  PLAQUE LOCATION:                    Bulb, ECA           Bulb   IMPRESSION:  0-19% stenosis bilaterally.   ___________________________________________  Larina Earthly, M.D.   CJ/MEDQ  D:  05/02/2009  T:  05/02/2009  Job:  [id number]

## 2010-11-04 ENCOUNTER — Telehealth: Payer: Self-pay | Admitting: Internal Medicine

## 2010-11-04 NOTE — Telephone Encounter (Signed)
Spoke with patient-aware that CY is out of the office this afternoon but will have him address this in the morning; states she can't take Mucinex due to the size of pill. Pt understands to continue taking Zpak Rx.

## 2010-11-05 DIAGNOSIS — Z9289 Personal history of other medical treatment: Secondary | ICD-10-CM

## 2010-11-05 HISTORY — DX: Personal history of other medical treatment: Z92.89

## 2010-11-05 NOTE — Telephone Encounter (Signed)
Called and spoke with pt and she is aware per CY to stop the mucinex and try plain robitussin.

## 2010-11-05 NOTE — Telephone Encounter (Signed)
Per CY-can take plain Robitussin instead of Mucinex

## 2010-11-06 NOTE — Assessment & Plan Note (Signed)
[facility]                               PULMONARY OFFICE NOTE   NAME:[name], [name]                        MRN:          [id number]  DATE:04/28/2006                            DOB:          [birth year]    PULMONARY FOLLOWUP   PROBLEMS:  1. Chronic cough.  2. Asthmatic bronchitis.  3. Allergic rhinitis.  4. Esophageal reflux.   HISTORY:  She continues allergy vaccine with vial A at 1:500 and vial B at  1:50 given here with no problems as long as we hold those doses.  Less  cough, but she admits some days are a little worse than others with no  pattern that she recognizes.  Advair had irritated her throat some.  A  modified barium swallow concluded that they could not rule out reflux-  related aspiration, but did not see anything specific happening.  They gave  her some basic recommendations to follow solid foods with liquids and to  moisten dry foods.  They thought she had a prominent cricopharyngeal muscle.  She has not had wheezing.   MEDICATIONS:  1. Nexium 40 mg.  2. Diovan 160/25.  3. Toprol XL 50 mg x1/2.  4. Aspirin 81 mg.  5. Evista.  6. Allergy vaccine.  7. Claritin.  8. Rescue albuterol inhaler.   DRUG INTOLERANCES:  ADVAIR and MYCINS.   OBJECTIVE:  Weight 175 pounds, BP 128/72, pulse regular 73, room air  saturation 98%.  Mild dry cough noted.  Her pharynx is not red.  I do not see obvious drainage or nasal congestion.  Voice quality is normal with no stridor.  LUNGS:  Quiet without wheeze or rales.   IMPRESSION:  Cough, probably multifactorial.   PLAN:  1. Try sample Symbicort 160/4.5 two puffs b.i.d. with discussion      emphasizing thrush      prevention.  2. Schedule return in 6 months, earlier p.r.n. while continuing reflux      precautions.     [name] D. Maple Hudson, MD, [name], FACP  Electronically Signed    CDY/MedQ  DD: 05/01/2006  DT: 05/01/2006  Job #: [id number]   cc:   [name], M.D.

## 2010-11-06 NOTE — Assessment & Plan Note (Signed)
[facility]                               PULMONARY OFFICE NOTE   NAME:PARRISHTu, Shimmel                        MRN:          [id number]  DATE:03/28/2006                            DOB:          [birth year]    PULMONARY FOLLOW-UP:   PROBLEMS:  1. Chronic cough.  2. Asthmatic bronchitis.  3. Allergic rhinitis.  4. Esophageal reflux.   HISTORY:  Says she never stopped coughing since last year.  It remains  nonproductive, and she denies wheeze.  It does not wake her at night, and  she was not aware of either postnasal drainage, heartburn or reflux as long  as she takes Nexium.  She and her husband both say she coughs a lot right  after meals.  Using Claritin helps some.  There has been no adenopathy,  purulent or bloody discharge, or fever.  She continues allergy vaccine here,  holding at 1:500 concentration for vial A and 1:50 concentration for vial B  because of local reactions.  Doses are split.   MEDICATIONS:  1. Nexium 40 mg.  2. Diovan 160/25 mg.  3. Toprol XL 50 mg times one-half.  4. Aspirin 81 mg.  5. Evista.  6. Allergy vaccine.  7. Claritin.  8. Albuterol inhaler.   Drug-intolerant to ADVAIR and MYCINS.   OBJECTIVE:  VITAL SIGNS:  Weight 175 pounds, BP 122/62, pulse regular at 78,  room air saturation 99%.  HEENT:  Pharynx is not red, but she is mildly hoarse and voice breaks  easily.  No stridor.  CHEST:  There are a few crackles in the left mid zone.  Dry cough.  CARDIAC:  Regular heart sounds without murmur.  EXTREMITIES:  No edema.   IMPRESSION:  Association of cough with meals makes esophageal reflux much  more likely.  Note that chest CT last December had shown evidence of old  granulomatous disease.  Her PPD skin test was negative May 9.   PLAN:  1. Modified barium swallow, speech therapy-assisted.  2. Chest x-ray.  3. Schedule return 1 month.  4. Phenergan with codeine cough syrup 300 mL, 5 mL q.6h. p.r.n.,  occasional use as discussed.       Clinton D. Maple Hudson, MD, FCCP, FACP      CDY/MedQ  DD:  03/28/2006  DT:  03/30/2006  Job #:  [id number]   cc:   Veverly Fells. Altheimer, M.D.

## 2010-11-20 ENCOUNTER — Ambulatory Visit (INDEPENDENT_AMBULATORY_CARE_PROVIDER_SITE_OTHER): Payer: Medicare Other

## 2010-11-20 DIAGNOSIS — J309 Allergic rhinitis, unspecified: Secondary | ICD-10-CM

## 2010-12-17 ENCOUNTER — Other Ambulatory Visit: Payer: Self-pay | Admitting: Neurology

## 2010-12-17 DIAGNOSIS — R0989 Other specified symptoms and signs involving the circulatory and respiratory systems: Secondary | ICD-10-CM

## 2010-12-21 ENCOUNTER — Ambulatory Visit
Admission: RE | Admit: 2010-12-21 | Discharge: 2010-12-21 | Disposition: A | Discharge status: Home / Self Care | Payer: Medicare Other | Source: Ambulatory Visit | Attending: Neurology | Admitting: Neurology

## 2010-12-21 DIAGNOSIS — R0989 Other specified symptoms and signs involving the circulatory and respiratory systems: Secondary | ICD-10-CM

## 2010-12-21 MED ORDER — IOHEXOL 350 MG/ML SOLN
100.0000 mL | Freq: Once | INTRAVENOUS | Status: AC | PRN
  Administered 2010-12-21: 100 mL via INTRAVENOUS

## 2010-12-25 ENCOUNTER — Ambulatory Visit (INDEPENDENT_AMBULATORY_CARE_PROVIDER_SITE_OTHER): Payer: Medicare Other

## 2010-12-25 DIAGNOSIS — J309 Allergic rhinitis, unspecified: Secondary | ICD-10-CM

## 2011-01-22 ENCOUNTER — Ambulatory Visit (INDEPENDENT_AMBULATORY_CARE_PROVIDER_SITE_OTHER): Payer: Medicare Other

## 2011-01-22 DIAGNOSIS — J309 Allergic rhinitis, unspecified: Secondary | ICD-10-CM

## 2011-02-03 ENCOUNTER — Encounter: Payer: Self-pay | Admitting: Internal Medicine

## 2011-02-19 ENCOUNTER — Encounter: Payer: Self-pay | Admitting: Internal Medicine

## 2011-02-23 ENCOUNTER — Encounter: Payer: Self-pay | Admitting: Internal Medicine

## 2011-02-23 ENCOUNTER — Ambulatory Visit (INDEPENDENT_AMBULATORY_CARE_PROVIDER_SITE_OTHER): Payer: Medicare Other | Admitting: Internal Medicine

## 2011-02-23 ENCOUNTER — Other Ambulatory Visit: Payer: Medicare Other

## 2011-02-23 VITALS — BP 126/62 | HR 60 | Ht 59.0 in | Wt 168.0 lb

## 2011-02-23 DIAGNOSIS — R05 Cough: Secondary | ICD-10-CM

## 2011-02-23 DIAGNOSIS — J301 Allergic rhinitis due to pollen: Secondary | ICD-10-CM

## 2011-02-23 DIAGNOSIS — K219 Gastro-esophageal reflux disease without esophagitis: Secondary | ICD-10-CM

## 2011-02-23 DIAGNOSIS — J45909 Unspecified asthma, uncomplicated: Secondary | ICD-10-CM

## 2011-02-23 NOTE — Assessment & Plan Note (Signed)
To continue allergy vaccine

## 2011-02-23 NOTE — Assessment & Plan Note (Signed)
Cyclical cough relates to allergy and GERD. We will pull PFT data from [facility] Plan 1) add omeprazole to nexium for BID therapy          2) Allergy Profile          3) Methacholine challenge

## 2011-02-23 NOTE — Progress Notes (Signed)
  Subjective:    Patient ID: [name], female    DOB: 1932-03-01, 77 y.o.   MRN: [id number]  HPI 02/23/11- 79 yoF never smoker followed for asthma, allergic rhinitis, cough, complicated by GERD Last here August 21, 2010 Holding allergy vaccine at 1:500 to avoid local reactions. Her cough persists with throat tickle. Takes a lot of cough drops. Admits ongoing occasional reflux, which we discussed again. In September we discussed her GI visit with Dr Evette Cristal who had done endoscopy and recommended she take 2 Nexium- insurance won't pay. She has 3 cats- all her life. HOB is elevated.  Review of Systems Constitutional:   No-   weight loss, night sweats, fevers, chills, fatigue, lassitude. HEENT:   No-  headaches, difficulty swallowing, tooth/dental problems, sore throat,       No-  sneezing, itching, ear ache, nasal congestion, post nasal drip,  CV:  No-   chest pain, orthopnea, PND, swelling in lower extremities, anasarca, dizziness, palpitations Resp: No-   shortness of breath with exertion or at rest.            +non-productive cough,  No-  coughing up of blood.              No-   change in color of mucus.  No- wheezing.   Skin: No-   rash or lesions. GI:  No-   heartburn, indigestion, abdominal pain, nausea, vomiting, diarrhea,                 change in bowel habits, loss of appetite GU: No-   dysuria, change in color of urine, no urgency or frequency.  No- flank pain. MS:  No-   joint pain or swelling.  No- decreased range of motion.  No- back pain. Neuro- grossly normal to observation, Or:  Psych:  No- change in mood or affect. No depression or anxiety.  No memory loss.      Objective:   Physical Exam General- Alert, Oriented, Affect-appropriate, Distress- none acute  Overweight Skin- rash-none, lesions- none, excoriation- none Lymphadenopathy- none Head- atraumatic            Eyes- Gross vision intact, PERRLA, conjunctivae clear secretions            Ears- Hearing, canals- normal         Nose- Clear, No- Septal dev, mucus, polyps, erosion, perforation             Throat- Mallampati II , mucosa clear , drainage- none, tonsils- atrophic    No drainage or erythema Neck- flexible , trachea midline, no stridor , thyroid nl, carotid no bruit Chest - symmetrical excursion , unlabored           Heart/CV- RRR , no murmur , no gallop  , no rub, nl s1 s2                           - JVD- none , edema- none, stasis changes- none, varices- none           Lung- clear to P&A, wheeze- none, cough- once with deep breath , dullness-none, rub- none           Chest wall- pacemaker Abd- tender-no, distended-no, bowel sounds-present, HSM- no Br/ Gen/ Rectal- Not done, not indicated Extrem- cyanosis- none, clubbing, none, atrophy- none, strength- nl Neuro- grossly intact to observation         Assessment & Plan:

## 2011-02-23 NOTE — Patient Instructions (Signed)
For stronger acid blocker therapy- continue Nexium once daily. ADD otc omeprazole- 1 each evening. Take these before meals.  Orders- [facility]- schedule at [facility] PFT lab- methacholine inhalation challenge test  Dx cough/ asthma     Lab- allergy profile

## 2011-02-24 ENCOUNTER — Other Ambulatory Visit: Payer: Self-pay | Admitting: Endocrinology

## 2011-02-24 DIAGNOSIS — Z1231 Encounter for screening mammogram for malignant neoplasm of breast: Secondary | ICD-10-CM

## 2011-02-24 LAB — ALLERGY FULL PROFILE
Bahia Grass: <0.1 kU/L (ref <0.35)
Box Elder IgE: <0.1 kU/L (ref <0.35)
Curvularia lunata: <0.1 kU/L (ref <0.35)
Elm IgE: <0.1 kU/L (ref <0.35)
Fescue: <0.1 kU/L (ref <0.35)
G005 Rye, Perennial: <0.1 kU/L (ref <0.35)
G009 Red Top: <0.1 kU/L (ref <0.35)
Sycamore Tree: <0.1 kU/L (ref <0.35)

## 2011-02-25 ENCOUNTER — Encounter: Payer: Self-pay | Admitting: Internal Medicine

## 2011-02-25 NOTE — Assessment & Plan Note (Signed)
Considered potential role of reflux aggravating her asthma. We will try double therapy.

## 2011-03-03 ENCOUNTER — Telehealth: Payer: Self-pay | Admitting: Internal Medicine

## 2011-03-03 NOTE — Telephone Encounter (Signed)
Spoke with pt and notified of recs per CDY She verbalized understanding 

## 2011-03-03 NOTE — Telephone Encounter (Signed)
I spoke with pt she states she cancelled her Methacholine challenge test for tomorrow. Pt states she will reschedule once she gets to feeling better. Pt states she has had a sore throat, chest congestion, wheezing, post nasal drip x yesterday. Pt states she also has a little dry cough but only happens at night. Pt is requesting recs from Dr. Maple Hudson. Please advise, Thanks  Allergies  Allergen Reactions  . Advair Hfa   . Sulfonamide Derivatives     Carver Fila, CMA

## 2011-03-03 NOTE — Telephone Encounter (Signed)
At this point it is either viral or allergy. I don't think an antibiotic will help now.  Rec- continue routine meds. Try adding mucinex-DM. Let us know if it progresses.

## 2011-03-04 ENCOUNTER — Encounter (HOSPITAL_COMMUNITY): Payer: Medicare Other

## 2011-03-08 ENCOUNTER — Telehealth: Payer: Self-pay | Admitting: Internal Medicine

## 2011-03-08 NOTE — Telephone Encounter (Signed)
Spoke with pt. She states that her cough is not improving since 02/23/11 ov. She is requesting appt. I advised nothing is open with CDY today, but have sched her for 3:15 tomorrow. I advised seek emergency care in the meantime if needed.

## 2011-03-09 ENCOUNTER — Encounter: Payer: Self-pay | Admitting: Internal Medicine

## 2011-03-09 ENCOUNTER — Ambulatory Visit (INDEPENDENT_AMBULATORY_CARE_PROVIDER_SITE_OTHER): Payer: Medicare Other | Admitting: Internal Medicine

## 2011-03-09 ENCOUNTER — Ambulatory Visit (INDEPENDENT_AMBULATORY_CARE_PROVIDER_SITE_OTHER)
Admission: RE | Admit: 2011-03-09 | Discharge: 2011-03-09 | Disposition: A | Discharge status: Home / Self Care | Payer: Medicare Other | Source: Ambulatory Visit | Attending: Internal Medicine | Admitting: Internal Medicine

## 2011-03-09 VITALS — BP 122/70 | HR 68 | Ht 59.0 in | Wt 164.8 lb

## 2011-03-09 DIAGNOSIS — R05 Cough: Secondary | ICD-10-CM

## 2011-03-09 DIAGNOSIS — K219 Gastro-esophageal reflux disease without esophagitis: Secondary | ICD-10-CM

## 2011-03-09 DIAGNOSIS — J301 Allergic rhinitis due to pollen: Secondary | ICD-10-CM

## 2011-03-09 DIAGNOSIS — J40 Bronchitis, not specified as acute or chronic: Secondary | ICD-10-CM

## 2011-03-09 MED ORDER — METHYLPREDNISOLONE ACETATE 80 MG/ML IJ SUSP
80.0000 mg | Freq: Once | INTRAMUSCULAR | Status: AC
  Administered 2011-03-09: 80 mg via INTRAMUSCULAR

## 2011-03-09 MED ORDER — ALBUTEROL SULFATE (2.5 MG/3ML) 0.083% IN NEBU
2.5000 mg | INHALATION_SOLUTION | Freq: Once | RESPIRATORY_TRACT | Status: AC
  Administered 2011-03-09: 2.5 mg via RESPIRATORY_TRACT

## 2011-03-09 MED ORDER — HYDROCODONE-HOMATROPINE 5-1.5 MG/5ML PO SYRP: 2.5000 mL | ORAL_SOLUTION | Freq: Four times a day (QID) | ORAL | Status: DC | PRN

## 2011-03-09 NOTE — Progress Notes (Signed)
Subjective:   Subjective:    Patient ID: [name], female    DOB: [birth year], 77 y.o.   MRN: [id number]  HPI 02/23/11- 40 yoF never smoker followed for asthma, allergic rhinitis, cough, complicated by GERD Last here August 21, 2010 Holding allergy vaccine at 1:500 to avoid local reactions. Her cough persists with throat tickle. Takes a lot of cough drops. Admits ongoing occasional reflux, which we discussed again. In September we discussed her GI visit with Dr Evette Cristal who had done endoscopy and recommended she take 2 Nexium- insurance won't pay. She has 3 cats- all her life. HOB is elevated.  03/09/2011 -77 yoF never smoker followed for asthma, allergic rhinitis, cough, complicated by GERD Acute visit. For 2 weeks she has had a sore throat with increased postnasal drainage worse cough watery nose. She canceled her methacholine test because of the cough. Nose and ears are itching. We had doubled her GERD medication by adding omeprazole plus Nexium. She does admit some reflux but not heartburn. Allergy profile: 02/23/2011 IgE 14.1 with elevations for ragweed and goldenrod  Review of Systems Constitutional:   No-   weight loss, night sweats, fevers, chills, fatigue, lassitude. HEENT:   No-  headaches, difficulty swallowing, tooth/dental problems, sore throat,       +  sneezing, itching, ear ache, nasal congestion, +post nasal drip,  CV:  No-   chest pain, orthopnea, PND, swelling in lower extremities, anasarca, dizziness, palpitations Resp: No-   shortness of breath with exertion or at rest.            +non-productive cough,  No-  coughing up of blood.              No-   change in color of mucus.  No- wheezing.   Skin: No-   rash or lesions. GI:  No-   heartburn, indigestion, abdominal pain, nausea, vomiting, diarrhea,                 change in bowel habits, loss of appetite GU: No-   dysuria, change in color of urine, no urgency or frequency.  No- flank pain. MS:  No-   joint pain or swelling.   No- decreased range of motion.  No- back pain. Neuro- grossly normal to observation, Or:  Psych:  No- change in mood or affect. No depression or anxiety.  No memory loss.      Objective:   Physical Exam General- Alert, Oriented, Affect-appropriate, Distress- none acute  Overweight Skin- rash-none, lesions- none, excoriation- none Lymphadenopathy- none Head- atraumatic            Eyes- Gross vision intact, PERRLA, conjunctivae clear secretions            Ears- Hearing, canals- normal            Nose- Clear, No- Septal dev, mucus, polyps, erosion, perforation             Throat- Mallampati II , mucosa clear , drainage- none, tonsils- atrophic    No drainage or erythema Neck- flexible , trachea midline, no stridor , thyroid nl, carotid no bruit Chest - symmetrical excursion , unlabored           Heart/CV- RRR , no murmur , no gallop  , no rub, nl s1 s2                           - JVD- none , edema- none, stasis changes-  none, varices- none           Lung- wheezy cough , dullness-none, rub- none           Chest wall- pacemaker Abd- tender-no, distended-no, bowel sounds-present, HSM- no Br/ Gen/ Rectal- Not done, not indicated Extrem- cyanosis- none, clubbing, none, atrophy- none, strength- nl Neuro- grossly intact to observation         Assessment & Plan:   HPI Review of Systems    Objective:   Physical Exam    Assessment & Plan:

## 2011-03-09 NOTE — Patient Instructions (Signed)
Script for cough syrup  Neb alb  Depo 80  Sample Arcapta neohaler- bronchodilator medicine    Try 1 daily   Order- CXR - dx bronchitis

## 2011-03-09 NOTE — Progress Notes (Signed)
Quick Note:  Pt aware of results; appt on 03-09-11. ______

## 2011-03-12 NOTE — Progress Notes (Signed)
Quick Note:  LMTCB ______ 

## 2011-03-12 NOTE — Progress Notes (Signed)
Quick Note:  Pt aware of results. ______ 

## 2011-03-13 NOTE — Assessment & Plan Note (Signed)
Antireflux measures including twice a day acid blocker reinforced.

## 2011-03-13 NOTE — Assessment & Plan Note (Signed)
Seasonal allergies and may have more importance at some times of year. We are deciding to continue allergy vaccine for now.

## 2011-03-13 NOTE — Assessment & Plan Note (Signed)
I think this is cyclical cough with contributions from reflux, post nasal drainage and viral infection. Plan: Long acting bronchodilator inhaler for stability, cough syrup, Depo 80, nebulizer, chest x-ray

## 2011-03-17 LAB — LIPID PANEL
Cholesterol: 153
HDL: 59
LDL Cholesterol: 77
Triglycerides: 87

## 2011-03-17 LAB — DIFFERENTIAL
Basophils Absolute: 0.1
Basophils Relative: 1
Eosinophils Absolute: 0.2
Eosinophils Relative: 2
Lymphocytes Relative: 22
Neutro Abs: 4.5
Neutrophils Relative %: 65

## 2011-03-17 LAB — CBC
Hemoglobin: 13.2
MCHC: 34.9
MCV: 91.1
Platelets: 205
Platelets: 223
RDW: 12.1
RDW: 12.1

## 2011-03-17 LAB — POCT CARDIAC MARKERS
Myoglobin, poc: 101
Myoglobin, poc: 78.7
Operator id: 270651
Operator id: 294341
Troponin i, poc: <0.05

## 2011-03-17 LAB — COMPREHENSIVE METABOLIC PANEL
ALT: 133 — ABNORMAL HIGH
AST: 129 — ABNORMAL HIGH
Albumin: 3.7
Alkaline Phosphatase: 69
GFR calc Af Amer: >60
Glucose, Bld: 150 — ABNORMAL HIGH
Potassium: 3.2 — ABNORMAL LOW
Sodium: 134 — ABNORMAL LOW
Total Protein: 7.2

## 2011-03-17 LAB — URINALYSIS, ROUTINE W REFLEX MICROSCOPIC
Glucose, UA: NEGATIVE
Ketones, ur: NEGATIVE
Nitrite: NEGATIVE
Protein, ur: NEGATIVE

## 2011-03-17 LAB — URINE CULTURE: Colony Count: >=100000

## 2011-03-17 LAB — POCT I-STAT, CHEM 8
BUN: 18
Calcium, Ion: 1.11 — ABNORMAL LOW
Chloride: 103
Creatinine, Ser: 1.1
Hemoglobin: 13.9
Potassium: 3.7
Sodium: 138
Sodium: 139
TCO2: 29

## 2011-03-17 LAB — CK TOTAL AND CKMB (NOT AT ARMC): Total CK: 57

## 2011-03-17 LAB — URINE MICROSCOPIC-ADD ON

## 2011-03-17 LAB — T3: T3, Total: 134.8 (ref 80.0–204.0)

## 2011-03-17 LAB — TROPONIN I: Troponin I: <0.01

## 2011-03-17 LAB — HEPARIN LEVEL (UNFRACTIONATED): Heparin Unfractionated: 0.45

## 2011-03-17 LAB — TSH: TSH: 1.772

## 2011-03-17 LAB — MAGNESIUM: Magnesium: 2.3

## 2011-03-17 LAB — CARDIAC PANEL(CRET KIN+CKTOT+MB+TROPI)
CK, MB: 1
Total CK: 37
Troponin I: 0.03

## 2011-03-18 LAB — CBC
HCT: 28 — ABNORMAL LOW
HCT: 28.5 — ABNORMAL LOW
HCT: 31.6 — ABNORMAL LOW
Hemoglobin: 9.7 — ABNORMAL LOW
Hemoglobin: 9.7 — ABNORMAL LOW
MCHC: 33.9
MCHC: 34.3
MCHC: 34.5
MCV: 90.5
MCV: 91.2
MCV: 91.3
Platelets: 140 — ABNORMAL LOW
Platelets: 155
RBC: 3.13 — ABNORMAL LOW
RBC: 3.46 — ABNORMAL LOW
RBC: 3.49 — ABNORMAL LOW
RDW: 12.5
RDW: 12.6
WBC: 10.6 — ABNORMAL HIGH
WBC: 8

## 2011-03-18 LAB — CARDIAC PANEL(CRET KIN+CKTOT+MB+TROPI)
Relative Index: INVALID
Relative Index: INVALID
Total CK: 30
Total CK: 35
Total CK: 40
Troponin I: 0.02

## 2011-03-18 LAB — COMPREHENSIVE METABOLIC PANEL
ALT: 29
AST: 77 — ABNORMAL HIGH
Albumin: 2.8 — ABNORMAL LOW
BUN: 16
CO2: 27
Calcium: 8.3 — ABNORMAL LOW
Calcium: 8.7
Creatinine, Ser: 0.79
Creatinine, Ser: 0.96
GFR calc Af Amer: >60
GFR calc non Af Amer: 57 — ABNORMAL LOW
Glucose, Bld: 154 — ABNORMAL HIGH
Sodium: 139
Total Protein: 5.8 — ABNORMAL LOW
Total Protein: 5.9 — ABNORMAL LOW

## 2011-03-18 LAB — BASIC METABOLIC PANEL
BUN: 12
BUN: 12
BUN: 14
CO2: 25
CO2: 27
Chloride: 102
Chloride: 104
Chloride: 108
Creatinine, Ser: 0.75
GFR calc Af Amer: >60
GFR calc Af Amer: >60
GFR calc non Af Amer: >60
Glucose, Bld: 87
Potassium: 3.6
Potassium: 3.8
Potassium: 3.9
Potassium: 3.9
Sodium: 136
Sodium: 140
Sodium: 141

## 2011-03-18 LAB — PROTIME-INR: Prothrombin Time: 12.7

## 2011-03-18 LAB — URINALYSIS, ROUTINE W REFLEX MICROSCOPIC
Bilirubin Urine: NEGATIVE
Glucose, UA: NEGATIVE
Protein, ur: NEGATIVE
Urobilinogen, UA: 0.2

## 2011-03-18 LAB — B-NATRIURETIC PEPTIDE (CONVERTED LAB)
Pro B Natriuretic peptide (BNP): 107 — ABNORMAL HIGH
Pro B Natriuretic peptide (BNP): 88

## 2011-03-18 LAB — URINE CULTURE
Colony Count: NO GROWTH
Culture: NO GROWTH

## 2011-03-18 LAB — POCT I-STAT, CHEM 8
Chloride: 105
Glucose, Bld: 117 — ABNORMAL HIGH
HCT: 31 — ABNORMAL LOW
Potassium: 3.8

## 2011-03-18 LAB — APTT: aPTT: 28

## 2011-03-18 LAB — HEPARIN LEVEL (UNFRACTIONATED): Heparin Unfractionated: 0.49

## 2011-03-18 LAB — DIFFERENTIAL
Basophils Absolute: 0
Basophils Relative: 0
Basophils Relative: 0
Eosinophils Absolute: 0.6
Lymphs Abs: 2
Monocytes Relative: 10
Neutro Abs: 4.9
Neutro Abs: 5.2
Neutrophils Relative %: 54
Neutrophils Relative %: 60

## 2011-03-18 LAB — CULTURE, BLOOD (ROUTINE X 2)

## 2011-03-18 LAB — CK TOTAL AND CKMB (NOT AT ARMC)
CK, MB: 1
Relative Index: INVALID
Total CK: 34

## 2011-03-18 LAB — TROPONIN I: Troponin I: 0.02

## 2011-03-18 LAB — URINE MICROSCOPIC-ADD ON

## 2011-03-18 LAB — MAGNESIUM: Magnesium: 2.1

## 2011-03-22 ENCOUNTER — Ambulatory Visit (INDEPENDENT_AMBULATORY_CARE_PROVIDER_SITE_OTHER): Payer: Medicare Other

## 2011-03-22 DIAGNOSIS — J309 Allergic rhinitis, unspecified: Secondary | ICD-10-CM

## 2011-04-05 ENCOUNTER — Ambulatory Visit: Payer: Medicare Other

## 2011-04-07 ENCOUNTER — Ambulatory Visit (INDEPENDENT_AMBULATORY_CARE_PROVIDER_SITE_OTHER): Payer: Medicare Other

## 2011-04-07 DIAGNOSIS — J309 Allergic rhinitis, unspecified: Secondary | ICD-10-CM

## 2011-04-15 ENCOUNTER — Ambulatory Visit (INDEPENDENT_AMBULATORY_CARE_PROVIDER_SITE_OTHER): Payer: Medicare Other

## 2011-04-15 DIAGNOSIS — J309 Allergic rhinitis, unspecified: Secondary | ICD-10-CM

## 2011-04-21 ENCOUNTER — Ambulatory Visit
Admission: RE | Admit: 2011-04-21 | Discharge: 2011-04-21 | Disposition: A | Discharge status: Home / Self Care | Payer: Medicare Other | Source: Ambulatory Visit | Attending: Endocrinology | Admitting: Endocrinology

## 2011-04-21 DIAGNOSIS — Z1231 Encounter for screening mammogram for malignant neoplasm of breast: Secondary | ICD-10-CM

## 2011-04-27 ENCOUNTER — Encounter: Payer: Self-pay | Admitting: Internal Medicine

## 2011-04-27 ENCOUNTER — Ambulatory Visit (INDEPENDENT_AMBULATORY_CARE_PROVIDER_SITE_OTHER): Payer: Medicare Other | Admitting: Internal Medicine

## 2011-04-27 DIAGNOSIS — R05 Cough: Secondary | ICD-10-CM

## 2011-04-27 DIAGNOSIS — J301 Allergic rhinitis due to pollen: Secondary | ICD-10-CM

## 2011-04-27 DIAGNOSIS — R059 Cough, unspecified: Secondary | ICD-10-CM

## 2011-04-27 MED ORDER — BENZONATATE 200 MG PO CAPS
200.0000 mg | ORAL_CAPSULE | Freq: Three times a day (TID) | ORAL | Status: AC | PRN
Start: 2011-04-27 — End: 2011-05-04

## 2011-04-27 NOTE — Patient Instructions (Signed)
We are stopping allergy shots now, to see if they have made any difference one way or the other with your cough.  Script for benzonatate pearls for cough

## 2011-04-27 NOTE — Progress Notes (Signed)
Patient ID: [name], female    DOB: [birth year], 77 y.o.   MRN: [id number]  HPI 02/23/11- 17 yoF never smoker followed for asthma, allergic rhinitis, cough, complicated by GERD Last here August 21, 2010 Holding allergy vaccine at 1:500 to avoid local reactions. Her cough persists with throat tickle. Takes a lot of cough drops. Admits ongoing occasional reflux, which we discussed again. In September we discussed her GI visit with Dr Evette Cristal who had done endoscopy and recommended she take 2 Nexium- insurance won't pay. She has 3 cats- all her life. HOB is elevated.  03/09/2011 -79 yoF never smoker followed for asthma, allergic rhinitis, cough, complicated by GERD Acute visit. For 2 weeks she has had a sore throat with increased postnasal drainage worse cough watery nose. She cancelled her methacholine test because of the cough. Nose and ears are itching. We had doubled her GERD medication by adding omeprazole plus Nexium. She does admit some reflux but not heartburn. Allergy profile: 02/23/2011 IgE 14.1 with elevations for ragweed and goldenrod  04/27/11- 79 yoF never smoker followed for asthma, allergic rhinitis, cough, complicated by GERD Has had flu vaccine. Coughing is a little better. She thinks it might be worse on the days she gets her allergy shots and her son has pointed that out. She does not cough at night. Coughs in the evenings, not related to meals. She doesn't often feel herself refluxing while she takes omeprazole and sleeps with the head of her bed elevated. We again discussed the idea of "cyclic cough". She was sick today she was scheduled to have a methacholine inhalation challenge test. We will reschedule if it seems useful.  Review of Systems Constitutional:   No-   weight loss, night sweats, fevers, chills, fatigue, lassitude. HEENT:   No-  headaches, difficulty swallowing, tooth/dental problems, sore throat,       +  sneezing, itching, ear ache, nasal congestion, +post nasal  drip,  CV:  No-   chest pain, orthopnea, PND, swelling in lower extremities, anasarca, dizziness, palpitations Resp: No-   shortness of breath with exertion or at rest.            +non-productive cough,  No-  coughing up of blood.              No-   change in color of mucus.  No- wheezing.   Skin: No-   rash or lesions. GI:  No-   heartburn, indigestion, abdominal pain, nausea, vomiting, diarrhea,                 change in bowel habits, loss of appetite GU: No-   dysuria, change in color of urine, no urgency or frequency.  No- flank pain. MS:  No-   joint pain or swelling.  No- decreased range of motion.  No- back pain. Neuro- grossly normal to observation, Or:  Psych:  No- change in mood or affect. No depression or anxiety.  No memory loss.      Objective:   Physical Exam General- Alert, Oriented, Affect-appropriate, Distress- none acute  Overweight Skin- rash-none, lesions- none, excoriation- none Lymphadenopathy- none Head- atraumatic            Eyes- Gross vision intact, PERRLA, conjunctivae clear secretions            Ears- Hearing, canals- normal            Nose- Clear, No- Septal dev, mucus, polyps, erosion, perforation  Throat- Mallampati II , mucosa clear , drainage- none, tonsils- atrophic    No drainage or erythema Neck- flexible , trachea midline, no stridor , thyroid nl, carotid no bruit Chest - symmetrical excursion , unlabored           Heart/CV- RRR , no murmur , no gallop  , no rub, nl s1 s2                           - JVD- none , edema- none, stasis changes- none, varices- none           Lung- + dry cough , dullness-none, rub- none           Chest wall- pacemaker Abd- tender-no, distended-no, bowel sounds-present, HSM- no Br/ Gen/ Rectal- Not done, not indicated Extrem- cyanosis- none, clubbing, none, atrophy- none, strength- nl Neuro- grossly intact to observation

## 2011-04-29 NOTE — Assessment & Plan Note (Signed)
She thinks allergy shots made aggravate her cough without help and so we are stopping now. Prescription for benzonatate.

## 2011-07-21 ENCOUNTER — Telehealth: Payer: Self-pay | Admitting: Internal Medicine

## 2011-07-21 MED ORDER — DOXYCYCLINE HYCLATE 100 MG PO CAPS: ORAL_CAPSULE | ORAL | Status: DC

## 2011-07-21 NOTE — Telephone Encounter (Signed)
Per CY  Doxycycline 100 mg #8  2 a day then 1 day til gone

## 2011-07-21 NOTE — Telephone Encounter (Signed)
Spoke with pt and notified of recs per CDY She verbalized understanding and states nothing further needed Rx was sent to pharm 

## 2011-07-21 NOTE — Telephone Encounter (Signed)
Spoke with pt. She is c/o worsening cough x 2 wks- "lingers in throat" sometimes prod with minimal yellow sputum. She states not having any other symptoms. Feels like abx may clear this up. Please advise, thanks! Allergies  Allergen Reactions  . Advair Hfa   . Sulfonamide Derivatives

## 2011-08-09 DIAGNOSIS — R82998 Other abnormal findings in urine: Secondary | ICD-10-CM | POA: Diagnosis not present

## 2011-08-11 DIAGNOSIS — D3130 Benign neoplasm of unspecified choroid: Secondary | ICD-10-CM | POA: Diagnosis not present

## 2011-08-11 DIAGNOSIS — Z961 Presence of intraocular lens: Secondary | ICD-10-CM | POA: Diagnosis not present

## 2011-08-11 DIAGNOSIS — H26499 Other secondary cataract, unspecified eye: Secondary | ICD-10-CM | POA: Diagnosis not present

## 2011-08-11 DIAGNOSIS — H2510 Age-related nuclear cataract, unspecified eye: Secondary | ICD-10-CM | POA: Diagnosis not present

## 2011-08-12 DIAGNOSIS — E782 Mixed hyperlipidemia: Secondary | ICD-10-CM | POA: Diagnosis not present

## 2011-08-12 DIAGNOSIS — I4891 Unspecified atrial fibrillation: Secondary | ICD-10-CM | POA: Diagnosis not present

## 2011-08-12 DIAGNOSIS — I119 Hypertensive heart disease without heart failure: Secondary | ICD-10-CM | POA: Diagnosis not present

## 2011-08-13 ENCOUNTER — Telehealth: Payer: Self-pay | Admitting: Internal Medicine

## 2011-08-13 NOTE — Telephone Encounter (Signed)
Returning call can be reached at [phone].Raylene Everts

## 2011-08-13 NOTE — Telephone Encounter (Signed)
I spoke with pt and she is requesting to restart her allergy vaccine. She stated she has been off it x 4 months now. Please advise Dr. Maple Hudson, thanks

## 2011-08-13 NOTE — Telephone Encounter (Signed)
LMOMTCB

## 2011-08-16 NOTE — Telephone Encounter (Signed)
Please let me know what her last vaccine dose was.

## 2011-08-17 ENCOUNTER — Encounter: Payer: Self-pay | Admitting: Internal Medicine

## 2011-08-17 NOTE — Telephone Encounter (Signed)
Suggest restart her vaccine at [facility] A[facility]B 1:5000,  0.1 ml of each, and build as tolerated.

## 2011-08-17 NOTE — Telephone Encounter (Signed)
[name] said that the shots aggravated her cough at her last office visit with you;so you stopped her vac..(04/29/11) The allergy record is in media.Her last shot was 04-15-11 vial A 1:500 0.1,vial B 1:50 0.2. Please advise.

## 2011-08-18 DIAGNOSIS — R7301 Impaired fasting glucose: Secondary | ICD-10-CM | POA: Diagnosis not present

## 2011-08-18 DIAGNOSIS — E039 Hypothyroidism, unspecified: Secondary | ICD-10-CM | POA: Diagnosis not present

## 2011-08-18 DIAGNOSIS — E785 Hyperlipidemia, unspecified: Secondary | ICD-10-CM | POA: Diagnosis not present

## 2011-08-18 DIAGNOSIS — R5382 Chronic fatigue, unspecified: Secondary | ICD-10-CM | POA: Diagnosis not present

## 2011-08-18 DIAGNOSIS — E559 Vitamin D deficiency, unspecified: Secondary | ICD-10-CM | POA: Diagnosis not present

## 2011-08-18 DIAGNOSIS — I10 Essential (primary) hypertension: Secondary | ICD-10-CM | POA: Diagnosis not present

## 2011-08-18 NOTE — Telephone Encounter (Signed)
Per CY-start back at once weekly.

## 2011-08-18 NOTE — Telephone Encounter (Signed)
Will make up 1:5000 for both vials. Will call pt.and let her know where you're starting her and that she can come in and get her shot.

## 2011-08-18 NOTE — Telephone Encounter (Signed)
Do you want her to build up twice a wk.or once a wk.. We did once a wk.before because she had several reactions. Please advise.

## 2011-08-19 NOTE — Telephone Encounter (Signed)
noted 

## 2011-08-20 DIAGNOSIS — M949 Disorder of cartilage, unspecified: Secondary | ICD-10-CM | POA: Diagnosis not present

## 2011-08-20 DIAGNOSIS — E039 Hypothyroidism, unspecified: Secondary | ICD-10-CM | POA: Diagnosis not present

## 2011-08-20 DIAGNOSIS — M899 Disorder of bone, unspecified: Secondary | ICD-10-CM | POA: Diagnosis not present

## 2011-08-20 DIAGNOSIS — I10 Essential (primary) hypertension: Secondary | ICD-10-CM | POA: Diagnosis not present

## 2011-08-20 DIAGNOSIS — R7301 Impaired fasting glucose: Secondary | ICD-10-CM | POA: Diagnosis not present

## 2011-08-20 DIAGNOSIS — E559 Vitamin D deficiency, unspecified: Secondary | ICD-10-CM | POA: Diagnosis not present

## 2011-08-20 DIAGNOSIS — E785 Hyperlipidemia, unspecified: Secondary | ICD-10-CM | POA: Diagnosis not present

## 2011-08-20 DIAGNOSIS — R5382 Chronic fatigue, unspecified: Secondary | ICD-10-CM | POA: Diagnosis not present

## 2011-08-24 ENCOUNTER — Ambulatory Visit (INDEPENDENT_AMBULATORY_CARE_PROVIDER_SITE_OTHER): Payer: Medicare Other

## 2011-08-24 DIAGNOSIS — J309 Allergic rhinitis, unspecified: Secondary | ICD-10-CM

## 2011-08-27 ENCOUNTER — Ambulatory Visit (INDEPENDENT_AMBULATORY_CARE_PROVIDER_SITE_OTHER): Payer: Medicare Other

## 2011-08-27 DIAGNOSIS — J309 Allergic rhinitis, unspecified: Secondary | ICD-10-CM

## 2011-08-30 ENCOUNTER — Ambulatory Visit (INDEPENDENT_AMBULATORY_CARE_PROVIDER_SITE_OTHER): Payer: Medicare Other

## 2011-08-30 DIAGNOSIS — J309 Allergic rhinitis, unspecified: Secondary | ICD-10-CM | POA: Diagnosis not present

## 2011-09-03 ENCOUNTER — Ambulatory Visit (INDEPENDENT_AMBULATORY_CARE_PROVIDER_SITE_OTHER): Payer: Medicare Other

## 2011-09-03 DIAGNOSIS — J309 Allergic rhinitis, unspecified: Secondary | ICD-10-CM

## 2011-09-07 ENCOUNTER — Ambulatory Visit (INDEPENDENT_AMBULATORY_CARE_PROVIDER_SITE_OTHER): Payer: Medicare Other

## 2011-09-07 DIAGNOSIS — J309 Allergic rhinitis, unspecified: Secondary | ICD-10-CM

## 2011-09-13 ENCOUNTER — Ambulatory Visit (INDEPENDENT_AMBULATORY_CARE_PROVIDER_SITE_OTHER): Payer: Medicare Other

## 2011-09-13 DIAGNOSIS — J309 Allergic rhinitis, unspecified: Secondary | ICD-10-CM

## 2011-09-16 ENCOUNTER — Ambulatory Visit (INDEPENDENT_AMBULATORY_CARE_PROVIDER_SITE_OTHER): Payer: Medicare Other

## 2011-09-16 DIAGNOSIS — J309 Allergic rhinitis, unspecified: Secondary | ICD-10-CM | POA: Diagnosis not present

## 2011-09-22 ENCOUNTER — Ambulatory Visit (INDEPENDENT_AMBULATORY_CARE_PROVIDER_SITE_OTHER): Payer: Medicare Other

## 2011-09-22 DIAGNOSIS — J309 Allergic rhinitis, unspecified: Secondary | ICD-10-CM

## 2011-09-30 ENCOUNTER — Ambulatory Visit (INDEPENDENT_AMBULATORY_CARE_PROVIDER_SITE_OTHER): Payer: Medicare Other

## 2011-09-30 DIAGNOSIS — J309 Allergic rhinitis, unspecified: Secondary | ICD-10-CM | POA: Diagnosis not present

## 2011-10-01 DIAGNOSIS — I442 Atrioventricular block, complete: Secondary | ICD-10-CM | POA: Diagnosis not present

## 2011-10-01 DIAGNOSIS — Z45018 Encounter for adjustment and management of other part of cardiac pacemaker: Secondary | ICD-10-CM | POA: Diagnosis not present

## 2011-10-01 DIAGNOSIS — R002 Palpitations: Secondary | ICD-10-CM | POA: Diagnosis not present

## 2011-10-08 ENCOUNTER — Ambulatory Visit (INDEPENDENT_AMBULATORY_CARE_PROVIDER_SITE_OTHER): Payer: Medicare Other

## 2011-10-08 DIAGNOSIS — J309 Allergic rhinitis, unspecified: Secondary | ICD-10-CM | POA: Diagnosis not present

## 2011-10-19 ENCOUNTER — Ambulatory Visit (INDEPENDENT_AMBULATORY_CARE_PROVIDER_SITE_OTHER): Payer: Medicare Other

## 2011-10-19 DIAGNOSIS — J309 Allergic rhinitis, unspecified: Secondary | ICD-10-CM | POA: Diagnosis not present

## 2011-10-26 ENCOUNTER — Ambulatory Visit (INDEPENDENT_AMBULATORY_CARE_PROVIDER_SITE_OTHER): Payer: Medicare Other

## 2011-10-26 ENCOUNTER — Encounter: Payer: Self-pay | Admitting: Internal Medicine

## 2011-10-26 ENCOUNTER — Ambulatory Visit (INDEPENDENT_AMBULATORY_CARE_PROVIDER_SITE_OTHER): Payer: Medicare Other | Admitting: Internal Medicine

## 2011-10-26 VITALS — BP 130/62 | HR 60 | Ht 59.0 in | Wt 167.8 lb

## 2011-10-26 DIAGNOSIS — J301 Allergic rhinitis due to pollen: Secondary | ICD-10-CM

## 2011-10-26 DIAGNOSIS — R05 Cough: Secondary | ICD-10-CM | POA: Diagnosis not present

## 2011-10-26 DIAGNOSIS — J309 Allergic rhinitis, unspecified: Secondary | ICD-10-CM

## 2011-10-26 DIAGNOSIS — R059 Cough, unspecified: Secondary | ICD-10-CM

## 2011-10-26 MED ORDER — TRAMADOL HCL 50 MG PO TABS: 50.0000 mg | ORAL_TABLET | Freq: Four times a day (QID) | ORAL | Status: AC | PRN

## 2011-10-26 NOTE — Progress Notes (Signed)
Patient ID: [name], female    DOB: 1931-09-28, 78 y.o.   MRN: [id number]  HPI 02/23/11- 6 yoF never smoker followed for asthma, allergic rhinitis, cough, complicated by GERD Last here August 21, 2010 Holding allergy vaccine at 1:500 to avoid local reactions. Her cough persists with throat tickle. Takes a lot of cough drops. Admits ongoing occasional reflux, which we discussed again. In September we discussed her GI visit with Dr Evette Cristal who had done endoscopy and recommended she take 2 Nexium- insurance won't pay. She has 3 cats- all her life. HOB is elevated.  03/09/2011 -79 yoF never smoker followed for asthma, allergic rhinitis, cough, complicated by GERD Acute visit. For 2 weeks she has had a sore throat with increased postnasal drainage worse cough watery nose. She cancelled her methacholine test because of the cough. Nose and ears are itching. We had doubled her GERD medication by adding omeprazole plus Nexium. She does admit some reflux but not heartburn. Allergy profile: 02/23/2011 IgE 14.1 with elevations for ragweed and goldenrod  04/27/11- 79 yoF never smoker followed for asthma, allergic rhinitis, cough, complicated by GERD Has had flu vaccine. Coughing is a little better. She thinks it might be worse on the days she gets her allergy shots and her son has pointed that out. She does not cough at night. Coughs in the evenings, not related to meals. She doesn't often feel herself refluxing while she takes omeprazole and sleeps with the head of her bed elevated. We again discussed the idea of "cyclic cough". She was sick today she was scheduled to have a methacholine inhalation challenge test. We will reschedule if it seems useful.  10/26/11- 79 yoF never smoker followed for asthma, allergic rhinitis, cough, complicated by GERD After concern that allergy shts were associated w/ incvreased cough, she decided she was better off on them and restarted. Now at 1:500. Some throat clearing and  postnasal drip. No overt reflux. HOB is raised. Failed benzonatate.  Review of Systems- see HPI Constitutional:   No-   weight loss, night sweats, fevers, chills, fatigue, lassitude. HEENT:   No-  headaches, difficulty swallowing, tooth/dental problems, sore throat,       +  sneezing, itching, ear ache, nasal congestion, +post nasal drip,  CV:  No-   chest pain, orthopnea, PND, swelling in lower extremities, anasarca, dizziness, palpitations Resp: No-   shortness of breath with exertion or at rest.            +non-productive cough,  No-  coughing up of blood.              No-   change in color of mucus.  No- wheezing.   Skin: No-   rash or lesions. GI:  No-   heartburn, indigestion, abdominal pain, nausea, vomiting, GU:  MS:  No-   joint pain or swelling.  Neuro- nothing unusual Psych:  No- change in mood or affect. No depression or anxiety.  No memory loss.      Objective:   Physical Exam General- Alert, Oriented, Affect-appropriate, Distress- none acute  Overweight Skin- rash-none, lesions- none, excoriation- none Lymphadenopathy- none Head- atraumatic            Eyes- Gross vision intact, PERRLA, conjunctivae clear secretions            Ears- Hearing, canals- normal            Nose- Clear, No- Septal dev, mucus, polyps, erosion, perforation  Throat- Mallampati II , mucosa clear , drainage- none, tonsils- atrophic    No drainage or erythema Neck- flexible , trachea midline, no stridor , thyroid nl, carotid no bruit Chest - symmetrical excursion , unlabored           Heart/CV- RRR , no murmur , no gallop  , no rub, nl s1 s2                           - JVD- none , edema- none, stasis changes- none, varices- none           Lung- + dry cough , dullness-none, rub- none, no wheeze           Chest wall- pacemaker Abd-  Br/ Gen/ Rectal- Not done, not indicated Extrem- cyanosis- none, clubbing, none, atrophy- none, strength- nl Neuro- grossly intact to  observation

## 2011-10-26 NOTE — Patient Instructions (Signed)
Continue building allergy vaccine  Script sent for tramadol for cough to use occasionally if needed

## 2011-10-30 NOTE — Assessment & Plan Note (Signed)
Ok to continue vaccine at 1:500

## 2011-10-30 NOTE — Assessment & Plan Note (Addendum)
Cyclical cough, somewhat better recently plan- try tramadol

## 2011-11-04 ENCOUNTER — Ambulatory Visit (INDEPENDENT_AMBULATORY_CARE_PROVIDER_SITE_OTHER): Payer: Medicare Other

## 2011-11-04 DIAGNOSIS — J309 Allergic rhinitis, unspecified: Secondary | ICD-10-CM

## 2011-11-11 ENCOUNTER — Ambulatory Visit (INDEPENDENT_AMBULATORY_CARE_PROVIDER_SITE_OTHER): Payer: Medicare Other

## 2011-11-11 DIAGNOSIS — J309 Allergic rhinitis, unspecified: Secondary | ICD-10-CM | POA: Diagnosis not present

## 2011-11-12 ENCOUNTER — Telehealth: Payer: Self-pay | Admitting: Internal Medicine

## 2011-11-12 MED ORDER — PROMETHAZINE-CODEINE 6.25-10 MG/5ML PO SYRP: 5.0000 mL | ORAL_SOLUTION | Freq: Four times a day (QID) | ORAL | Status: AC | PRN

## 2011-11-12 NOTE — Telephone Encounter (Signed)
Called and spoke with pt and she is aware of rx called to the pharmacy for pts cough.  Nothing further needed.

## 2011-11-12 NOTE — Telephone Encounter (Signed)
Ok phenergan codeine  120 ml,  1 tsp every 6 hours if needed

## 2011-11-12 NOTE — Telephone Encounter (Signed)
I spoke with pt and she c/o severe productive cough (doesn't know what color phlem is) adn having to clear her throat a lot x 2 days. Denies any f/c/s/n/v. She wants to know if CDY will call her in a cough syrup. Please advise Dr. Maple Hudson thanks  Allergies  Allergen Reactions  . Fluticasone-Salmeterol   . Sulfonamide Derivatives

## 2011-11-17 ENCOUNTER — Telehealth: Payer: Self-pay | Admitting: Internal Medicine

## 2011-11-17 MED ORDER — PREDNISONE 10 MG PO TABS: ORAL_TABLET | ORAL | Status: DC

## 2011-11-17 NOTE — Telephone Encounter (Signed)
I spoke with pt and she c/o cough w/ Yellow-white phlem, wheezing and throat feels very irritated (not really sore) x getting worse couple days now. Denies any f/c/s/n/v. She has been taking her phenergan w/ codeine cough syrup at night bc she doesn't want to feel sleepy during the day. Pt requesting further recs. Please advise Dr. Maple Hudson, thanks  Allergies  Allergen Reactions  . Fluticasone-Salmeterol   . Sulfonamide Derivatives

## 2011-11-17 NOTE — Telephone Encounter (Signed)
Per CY-for wheezing Prednisone 10 mg #20 take 4 x 2 days, 3 x 2 days, 2 x 2 days, 1 x 2 days, then stop no refills and for cough take Tessalon pearls 200 mg #20 1 tid prn no refills.

## 2011-11-17 NOTE — Telephone Encounter (Signed)
I spoke with pt and is aware of CDY recs. Pt states she already has tessalon pearls left over and will take what she has at home and did not need rx called in for that. Prednisone has been sent to the pharmacy

## 2011-11-19 ENCOUNTER — Encounter: Payer: Self-pay | Admitting: Internal Medicine

## 2011-11-25 DIAGNOSIS — H353190 Nonexudative age-related macular degeneration, unspecified eye, stage unspecified: Secondary | ICD-10-CM | POA: Diagnosis not present

## 2011-11-25 DIAGNOSIS — H35369 Drusen (degenerative) of macula, unspecified eye: Secondary | ICD-10-CM | POA: Diagnosis not present

## 2011-11-25 DIAGNOSIS — D3130 Benign neoplasm of unspecified choroid: Secondary | ICD-10-CM | POA: Diagnosis not present

## 2011-11-30 DIAGNOSIS — I10 Essential (primary) hypertension: Secondary | ICD-10-CM | POA: Diagnosis not present

## 2011-11-30 DIAGNOSIS — R7301 Impaired fasting glucose: Secondary | ICD-10-CM | POA: Diagnosis not present

## 2011-11-30 DIAGNOSIS — E039 Hypothyroidism, unspecified: Secondary | ICD-10-CM | POA: Diagnosis not present

## 2011-11-30 DIAGNOSIS — E785 Hyperlipidemia, unspecified: Secondary | ICD-10-CM | POA: Diagnosis not present

## 2011-11-30 DIAGNOSIS — E559 Vitamin D deficiency, unspecified: Secondary | ICD-10-CM | POA: Diagnosis not present

## 2011-12-02 DIAGNOSIS — M899 Disorder of bone, unspecified: Secondary | ICD-10-CM | POA: Diagnosis not present

## 2011-12-02 DIAGNOSIS — R7301 Impaired fasting glucose: Secondary | ICD-10-CM | POA: Diagnosis not present

## 2011-12-02 DIAGNOSIS — R5382 Chronic fatigue, unspecified: Secondary | ICD-10-CM | POA: Diagnosis not present

## 2011-12-02 DIAGNOSIS — E039 Hypothyroidism, unspecified: Secondary | ICD-10-CM | POA: Diagnosis not present

## 2011-12-02 DIAGNOSIS — I10 Essential (primary) hypertension: Secondary | ICD-10-CM | POA: Diagnosis not present

## 2011-12-02 DIAGNOSIS — E559 Vitamin D deficiency, unspecified: Secondary | ICD-10-CM | POA: Diagnosis not present

## 2011-12-02 DIAGNOSIS — E785 Hyperlipidemia, unspecified: Secondary | ICD-10-CM | POA: Diagnosis not present

## 2011-12-02 DIAGNOSIS — M949 Disorder of cartilage, unspecified: Secondary | ICD-10-CM | POA: Diagnosis not present

## 2011-12-20 ENCOUNTER — Telehealth: Payer: Self-pay | Admitting: Cardiovascular Disease

## 2011-12-20 NOTE — Telephone Encounter (Signed)
Forward to Dr. Nicki Guadalajara for review on 12-20-11 ym

## 2011-12-31 DIAGNOSIS — Z45018 Encounter for adjustment and management of other part of cardiac pacemaker: Secondary | ICD-10-CM | POA: Diagnosis not present

## 2011-12-31 DIAGNOSIS — I4891 Unspecified atrial fibrillation: Secondary | ICD-10-CM | POA: Diagnosis not present

## 2012-01-03 ENCOUNTER — Ambulatory Visit (INDEPENDENT_AMBULATORY_CARE_PROVIDER_SITE_OTHER): Payer: Medicare Other | Admitting: Adult Health

## 2012-01-03 ENCOUNTER — Telehealth: Payer: Self-pay | Admitting: Internal Medicine

## 2012-01-03 ENCOUNTER — Encounter: Payer: Self-pay | Admitting: Adult Health

## 2012-01-03 VITALS — BP 122/60 | HR 60 | Temp 97.5°F | Ht 59.0 in | Wt 169.4 lb

## 2012-01-03 DIAGNOSIS — J209 Acute bronchitis, unspecified: Secondary | ICD-10-CM | POA: Diagnosis not present

## 2012-01-03 DIAGNOSIS — R05 Cough: Secondary | ICD-10-CM

## 2012-01-03 MED ORDER — METHYLPREDNISOLONE ACETATE 80 MG/ML IJ SUSP
80.0000 mg | Freq: Once | INTRAMUSCULAR | Status: AC
  Administered 2012-01-03: 80 mg via INTRAMUSCULAR

## 2012-01-03 MED ORDER — LEVALBUTEROL HCL 0.63 MG/3ML IN NEBU
0.6300 mg | INHALATION_SOLUTION | Freq: Once | RESPIRATORY_TRACT | Status: AC
  Administered 2012-01-03: 0.63 mg via RESPIRATORY_TRACT

## 2012-01-03 NOTE — Progress Notes (Signed)
Patient ID: [name], female    DOB: 01-31-1932, 78 y.o.   MRN: [id number]  HPI 02/23/11- 78 yoF never smoker followed for asthma, allergic rhinitis, cough, complicated by GERD Last here August 21, 2010 Holding allergy vaccine at 1:500 to avoid local reactions. Her cough persists with throat tickle. Takes a lot of cough drops. Admits ongoing occasional reflux, which we discussed again. In September we discussed her GI visit with Dr Evette Cristal who had done endoscopy and recommended she take 2 Nexium- insurance won't pay. She has 3 cats- all her life. HOB is elevated.  03/09/2011 -78 yoF never smoker followed for asthma, allergic rhinitis, cough, complicated by GERD Acute visit. For 2 weeks she has had a sore throat with increased postnasal drainage worse cough watery nose. She cancelled her methacholine test because of the cough. Nose and ears are itching. We had doubled her GERD medication by adding omeprazole plus Nexium. She does admit some reflux but not heartburn. Allergy profile: 02/23/2011 IgE 14.1 with elevations for ragweed and goldenrod  04/27/11- 78 yoF never smoker followed for asthma, allergic rhinitis, cough, complicated by GERD Has had flu vaccine. Coughing is a little better. She thinks it might be worse on the days she gets her allergy shots and her son has pointed that out. She does not cough at night. Coughs in the evenings, not related to meals. She doesn't often feel herself refluxing while she takes omeprazole and sleeps with the head of her bed elevated. We again discussed the idea of "cyclic cough". She was sick today she was scheduled to have a methacholine inhalation challenge test. We will reschedule if it seems useful.  10/26/11- 78 yoF never smoker followed for asthma, allergic rhinitis, cough, complicated by GERD After concern that allergy shts were associated w/ incvreased cough, she decided she was better off on them and restarted. Now at 1:500. Some throat clearing and  postnasal drip. No overt reflux. HOB is raised. Failed benzonatate.  01/03/12 Acute OV  pt c/o dry cough x 2-3 years with little to no production. pt states she has had some wheezing at night time and some during the day time. Pt also c/o some chest congestion that comes and goes as well as PND.  pt states she completed a course of Pred with no relief. Called in steroid taper not much help  +nasal drip  No fever or discolored mucus  Never smoker. cxr  02/2011 no acute process.      Review of Systems- see HPI Constitutional:   No-   weight loss, night sweats, fevers, chills, fatigue, lassitude. HEENT:   No-  headaches, difficulty swallowing, tooth/dental problems, sore throat,       +  sneezing, itching, ear ache, nasal congestion, +post nasal drip,  CV:  No-   chest pain, orthopnea, PND, swelling in lower extremities, anasarca, dizziness, palpitations Resp: No-   shortness of breath with exertion or at rest.            +non-productive cough,  No-  coughing up of blood.              No-   change in color of mucus.  No- wheezing.   Skin: No-   rash or lesions. GI:  No-   heartburn, indigestion, abdominal pain, nausea, vomiting, GU:  MS:  No-   joint pain or swelling.  Neuro- nothing unusual Psych:  No- change in mood or affect. No depression or anxiety.  No memory loss.  Objective:   Physical Exam GEN: A/Ox3; pleasant , NAD  HEENT:  [place]/AT,  EACs-clear, TMs-wnl, NOSE-clear, THROAT-clear, no lesions, no postnasal drip or exudate noted.   NECK:  Supple w/ fair ROM; no JVD; normal carotid impulses w/o bruits; no thyromegaly or nodules palpated; no lymphadenopathy.  RESP  Coarse BS  w/o, wheezes/ rales/ or rhonchi.no accessory muscle use, no dullness to percussion  CARD:  RRR, no m/r/g  , no peripheral edema, pulses intact, no cyanosis or clubbing.  GI:   Soft & nt; nml bowel sounds; no organomegaly or masses detected.  Musco: Warm bil, no deformities or joint swelling noted.    Neuro: alert, no focal deficits noted.    Skin: Warm, no lesions or rashes

## 2012-01-03 NOTE — Telephone Encounter (Signed)
Called, spoke with pt who states she is coughing - mostly nonprod and had increased wheezing last night.  Wheezing made it difficult to sleep.  Denies increased SOB, chest tightness, or fever.  As Dr. Maple Hudson is off, OV scheduled with TP for today, July 15 at 11:15 am -- pt aware and ok with this.

## 2012-01-03 NOTE — Patient Instructions (Addendum)
Begin QVAR 2 puffs Twice daily  - brush/rinse/gargle after use  Begin Pepcid 20mg  At bedtime   Begin Zyrtec 10mg  At bedtime   Delsym 2 tsp Twice daily   Tessalon Three times a day  As needed  Cough GERD diet  Please contact office for sooner follow up if symptoms do not improve or worsen or seek emergency care  follow up Dr. Maple Hudson  In 4 weeks  NO MINTS

## 2012-01-06 NOTE — Assessment & Plan Note (Signed)
Slow to resolve flare aggravated by GERD and AR   Plan Begin QVAR 2 puffs Twice daily  - brush/rinse/gargle after use  Begin Pepcid 20mg  At bedtime   Begin Zyrtec 10mg  At bedtime   Delsym 2 tsp Twice daily   Tessalon Three times a day  As needed  Cough GERD diet  Please contact office for sooner follow up if symptoms do not improve or worsen or seek emergency care  follow up Dr. Maple Hudson  In 4 weeks  NO MINTS

## 2012-01-18 ENCOUNTER — Ambulatory Visit (INDEPENDENT_AMBULATORY_CARE_PROVIDER_SITE_OTHER): Payer: Medicare Other

## 2012-01-18 DIAGNOSIS — J309 Allergic rhinitis, unspecified: Secondary | ICD-10-CM | POA: Diagnosis not present

## 2012-01-25 ENCOUNTER — Ambulatory Visit (INDEPENDENT_AMBULATORY_CARE_PROVIDER_SITE_OTHER): Payer: Medicare Other

## 2012-01-25 DIAGNOSIS — J309 Allergic rhinitis, unspecified: Secondary | ICD-10-CM | POA: Diagnosis not present

## 2012-02-01 ENCOUNTER — Ambulatory Visit (INDEPENDENT_AMBULATORY_CARE_PROVIDER_SITE_OTHER): Payer: Medicare Other

## 2012-02-01 DIAGNOSIS — J309 Allergic rhinitis, unspecified: Secondary | ICD-10-CM

## 2012-02-11 ENCOUNTER — Encounter: Payer: Self-pay | Admitting: Internal Medicine

## 2012-02-11 ENCOUNTER — Ambulatory Visit (INDEPENDENT_AMBULATORY_CARE_PROVIDER_SITE_OTHER): Payer: Medicare Other

## 2012-02-11 ENCOUNTER — Ambulatory Visit (INDEPENDENT_AMBULATORY_CARE_PROVIDER_SITE_OTHER): Payer: Medicare Other | Admitting: Internal Medicine

## 2012-02-11 VITALS — BP 124/68 | HR 60 | Ht 59.0 in | Wt 171.0 lb

## 2012-02-11 DIAGNOSIS — K219 Gastro-esophageal reflux disease without esophagitis: Secondary | ICD-10-CM

## 2012-02-11 DIAGNOSIS — R05 Cough: Secondary | ICD-10-CM | POA: Diagnosis not present

## 2012-02-11 DIAGNOSIS — J309 Allergic rhinitis, unspecified: Secondary | ICD-10-CM | POA: Diagnosis not present

## 2012-02-11 DIAGNOSIS — J301 Allergic rhinitis due to pollen: Secondary | ICD-10-CM | POA: Diagnosis not present

## 2012-02-11 NOTE — Progress Notes (Signed)
Patient ID: [name], female    DOB: [birth year], 78 y.o.   MRN: [id number]  HPI 02/23/11- 85 yoF never smoker followed for asthma, allergic rhinitis, cough, complicated by GERD Last here August 21, 2010 Holding allergy vaccine at 1:500 to avoid local reactions. Her cough persists with throat tickle. Takes a lot of cough drops. Admits ongoing occasional reflux, which we discussed again. In September we discussed her GI visit with Dr Evette Cristal who had done endoscopy and recommended she take 2 Nexium- insurance won't pay. She has 3 cats- all her life. HOB is elevated.  03/09/2011 -79 yoF never smoker followed for asthma, allergic rhinitis, cough, complicated by GERD Acute visit. For 2 weeks she has had a sore throat with increased postnasal drainage worse cough watery nose. She cancelled her methacholine test because of the cough. Nose and ears are itching. We had doubled her GERD medication by adding omeprazole plus Nexium. She does admit some reflux but not heartburn. Allergy profile: 02/23/2011 IgE 14.1 with elevations for ragweed and goldenrod  04/27/11- 79 yoF never smoker followed for asthma, allergic rhinitis, cough, complicated by GERD Has had flu vaccine. Coughing is a little better. She thinks it might be worse on the days she gets her allergy shots and her son has pointed that out. She does not cough at night. Coughs in the evenings, not related to meals. She doesn't often feel herself refluxing while she takes omeprazole and sleeps with the head of her bed elevated. We again discussed the idea of "cyclic cough". She was sick today she was scheduled to have a methacholine inhalation challenge test. We will reschedule if it seems useful.  10/26/11- 79 yoF never smoker followed for asthma, allergic rhinitis, cough, complicated by GERD After concern that allergy shts were associated w/ incvreased cough, she decided she was better off on them and restarted. Now at 1:500. Some throat clearing and  postnasal drip. No overt reflux. HOB is raised. Failed benzonatate.  01/03/12 Acute OV  pt c/o dry cough x 2-3 years with little to no production. pt states she has had some wheezing at night time and some during the day time. Pt also c/o some chest congestion that comes and goes as well as PND.  pt states she completed a course of Pred with no relief. Called in steroid taper not much help  +nasal drip  No fever or discolored mucus  Never smoker. cxr  02/2011 no acute process.   02/11/12- 80 yoF never smoker followed for asthma, allergic rhinitis, cough, complicated by GERD Has been using non- menthol cough drops to help with cough. Would say cough better since last visit.  Tramadol has been some help. Fewer bad coughing spells, noted mostly after meals. We discussed reflux and cough during swallowing. Uses omeprazole and Nexium. Does occasionally feel reflux events. Occasional mild wheeze but no chest pain or shortness of breath. She feels allergy vaccine does well, currently rebuilding.  ROS-see HPI Constitutional:   No-   weight loss, night sweats, fevers, chills, fatigue, lassitude. HEENT:   No-  headaches, difficulty swallowing, tooth/dental problems, sore throat,       No-  sneezing, itching, ear ache, nasal congestion, post nasal drip,  CV:  No-   chest pain, orthopnea, PND, swelling in lower extremities, anasarca,  dizziness, palpitations Resp: No-   shortness of breath with exertion or at rest.              No-   productive cough,  No non-productive cough,  No- coughing up of blood.              No-   change in color of mucus.  No- wheezing.   Skin: No-   rash or lesions. GI:  No-   heartburn, indigestion, abdominal pain, nausea, vomiting, GU:  MS:  No-   joint pain or swelling.   Neuro-     nothing unusual Psych:  No- change in mood or affect. No depression or anxiety.  No memory loss.  OBJ- Physical Exam General- Alert, Oriented, Affect-appropriate, Distress- none acute Skin-  rash-none, lesions- none, excoriation- none Lymphadenopathy- none Head- atraumatic            Eyes- Gross vision intact, PERRLA, conjunctivae and secretions clear            Ears- Hearing, canals-normal            Nose- Clear, no-Septal dev, mucus, polyps, erosion, perforation             Throat- Mallampati II , mucosa clear , drainage- none, tonsils- atrophic Neck- flexible , trachea midline, no stridor , thyroid nl, carotid no bruit Chest - symmetrical excursion , unlabored           Heart/CV- RRR , no murmur , no gallop  , no rub, nl s1 s2                           - JVD- none , edema- none, stasis changes- none, varices- none           Lung- clear to P&A, wheeze- none, + deep breath triggered cough , dullness-none, rub- none           Chest wall-  Abd-  Br/ Gen/ Rectal- Not done, not indicated Extrem- cyanosis- none, clubbing, none, atrophy- none, strength- nl Neuro- grossly intact to observation

## 2012-02-11 NOTE — Patient Instructions (Addendum)
We can continue allergy vaccine  Continue taking acid blocker twice daily before meals  Ok to take the pearls for cough as needed

## 2012-02-16 DIAGNOSIS — I119 Hypertensive heart disease without heart failure: Secondary | ICD-10-CM | POA: Diagnosis not present

## 2012-02-16 DIAGNOSIS — R609 Edema, unspecified: Secondary | ICD-10-CM | POA: Diagnosis not present

## 2012-02-16 DIAGNOSIS — E782 Mixed hyperlipidemia: Secondary | ICD-10-CM | POA: Diagnosis not present

## 2012-02-19 NOTE — Assessment & Plan Note (Signed)
She recognizes distinct and occasional reflux events so we have emphasized reflux precautions again. She had seen GI/ Dr Evette Cristal once in the past.

## 2012-02-19 NOTE — Assessment & Plan Note (Signed)
A reflux triggered cough cycle still seems an important mechanism for her but there are allergy issues as well.

## 2012-02-19 NOTE — Assessment & Plan Note (Signed)
Her current assessment is that allergy vaccine helps her and does not trigger her cough.

## 2012-02-24 ENCOUNTER — Ambulatory Visit (INDEPENDENT_AMBULATORY_CARE_PROVIDER_SITE_OTHER): Payer: Medicare Other

## 2012-02-24 DIAGNOSIS — J309 Allergic rhinitis, unspecified: Secondary | ICD-10-CM

## 2012-03-07 DIAGNOSIS — R7301 Impaired fasting glucose: Secondary | ICD-10-CM | POA: Diagnosis not present

## 2012-03-07 DIAGNOSIS — E039 Hypothyroidism, unspecified: Secondary | ICD-10-CM | POA: Diagnosis not present

## 2012-03-07 DIAGNOSIS — E559 Vitamin D deficiency, unspecified: Secondary | ICD-10-CM | POA: Diagnosis not present

## 2012-03-07 DIAGNOSIS — I10 Essential (primary) hypertension: Secondary | ICD-10-CM | POA: Diagnosis not present

## 2012-03-07 DIAGNOSIS — E785 Hyperlipidemia, unspecified: Secondary | ICD-10-CM | POA: Diagnosis not present

## 2012-03-09 ENCOUNTER — Other Ambulatory Visit: Payer: Self-pay | Admitting: Endocrinology

## 2012-03-09 DIAGNOSIS — Z1231 Encounter for screening mammogram for malignant neoplasm of breast: Secondary | ICD-10-CM

## 2012-03-09 DIAGNOSIS — E559 Vitamin D deficiency, unspecified: Secondary | ICD-10-CM | POA: Diagnosis not present

## 2012-03-09 DIAGNOSIS — Z23 Encounter for immunization: Secondary | ICD-10-CM | POA: Diagnosis not present

## 2012-03-09 DIAGNOSIS — R7301 Impaired fasting glucose: Secondary | ICD-10-CM | POA: Diagnosis not present

## 2012-03-09 DIAGNOSIS — R5382 Chronic fatigue, unspecified: Secondary | ICD-10-CM | POA: Diagnosis not present

## 2012-03-09 DIAGNOSIS — I10 Essential (primary) hypertension: Secondary | ICD-10-CM | POA: Diagnosis not present

## 2012-03-09 DIAGNOSIS — E785 Hyperlipidemia, unspecified: Secondary | ICD-10-CM | POA: Diagnosis not present

## 2012-03-09 DIAGNOSIS — E039 Hypothyroidism, unspecified: Secondary | ICD-10-CM | POA: Diagnosis not present

## 2012-03-09 DIAGNOSIS — M899 Disorder of bone, unspecified: Secondary | ICD-10-CM | POA: Diagnosis not present

## 2012-03-09 DIAGNOSIS — M949 Disorder of cartilage, unspecified: Secondary | ICD-10-CM | POA: Diagnosis not present

## 2012-03-16 ENCOUNTER — Telehealth: Payer: Self-pay | Admitting: Internal Medicine

## 2012-03-16 MED ORDER — AZITHROMYCIN 1 G PO PACK: 1.0000 | PACK | Freq: Once | ORAL | Status: DC

## 2012-03-16 NOTE — Telephone Encounter (Signed)
Per CY Recs:  zpack promethazine with codeine 150 ml cough syrup-1 tsp every 6 hrs PRN for cough  To [facility] on [address] and spoke with patient, informed her that medications were being sent and called in to her verified pharmacy as requested.  Patient verbalized understanding and nothing further needed at this time.

## 2012-03-16 NOTE — Telephone Encounter (Signed)
Called and spoke with patient, complains that since Tuesday night she has had some hoarseness, a consistent productive cough (is unsure of the color) in which last night got so bad she thought she was "gonna die," says she has been blowing out green mucus from nose.  Reports only taking Robitussin and her daily zyrtec with no improvement.  Requesting recs from Dr. Maple Hudson. Please advise, thank you!

## 2012-03-17 MED ORDER — AZITHROMYCIN 250 MG PO TABS: 250.0000 mg | ORAL_TABLET | Freq: Every day | ORAL | Status: DC

## 2012-03-17 NOTE — Telephone Encounter (Signed)
[facility] called stating that they do not have Zpak 1 gm and wanted to sub this; I gave verbal that the RX should have been Zpak #1 take as directed (TABLETS). I have corrected this in EPIC as well.

## 2012-03-17 NOTE — Addendum Note (Signed)
Addended by: Ronny Bacon on: 03/17/2012 05:26 PM   Modules accepted: Orders

## 2012-03-20 ENCOUNTER — Telehealth: Payer: Self-pay | Admitting: Internal Medicine

## 2012-03-20 NOTE — Telephone Encounter (Signed)
Forward  9 pages from [facility] to Dr. Jetty Duhamel for review on 03-20-12 ym

## 2012-03-30 ENCOUNTER — Ambulatory Visit (INDEPENDENT_AMBULATORY_CARE_PROVIDER_SITE_OTHER): Payer: Medicare Other

## 2012-03-30 DIAGNOSIS — J309 Allergic rhinitis, unspecified: Secondary | ICD-10-CM

## 2012-04-04 DIAGNOSIS — I4891 Unspecified atrial fibrillation: Secondary | ICD-10-CM | POA: Diagnosis not present

## 2012-04-04 DIAGNOSIS — Z45018 Encounter for adjustment and management of other part of cardiac pacemaker: Secondary | ICD-10-CM | POA: Diagnosis not present

## 2012-04-06 ENCOUNTER — Ambulatory Visit (INDEPENDENT_AMBULATORY_CARE_PROVIDER_SITE_OTHER): Payer: Medicare Other

## 2012-04-06 DIAGNOSIS — J309 Allergic rhinitis, unspecified: Secondary | ICD-10-CM | POA: Diagnosis not present

## 2012-04-10 ENCOUNTER — Ambulatory Visit (INDEPENDENT_AMBULATORY_CARE_PROVIDER_SITE_OTHER): Payer: Medicare Other

## 2012-04-10 DIAGNOSIS — J309 Allergic rhinitis, unspecified: Secondary | ICD-10-CM | POA: Diagnosis not present

## 2012-04-13 ENCOUNTER — Ambulatory Visit (INDEPENDENT_AMBULATORY_CARE_PROVIDER_SITE_OTHER): Payer: Medicare Other

## 2012-04-13 DIAGNOSIS — J309 Allergic rhinitis, unspecified: Secondary | ICD-10-CM

## 2012-04-25 ENCOUNTER — Ambulatory Visit
Admission: RE | Admit: 2012-04-25 | Discharge: 2012-04-25 | Disposition: A | Discharge status: Home / Self Care | Payer: Medicare Other | Source: Ambulatory Visit | Attending: Endocrinology | Admitting: Endocrinology

## 2012-04-25 DIAGNOSIS — Z1231 Encounter for screening mammogram for malignant neoplasm of breast: Secondary | ICD-10-CM

## 2012-04-27 ENCOUNTER — Ambulatory Visit (INDEPENDENT_AMBULATORY_CARE_PROVIDER_SITE_OTHER): Payer: Medicare Other

## 2012-04-27 DIAGNOSIS — J309 Allergic rhinitis, unspecified: Secondary | ICD-10-CM

## 2012-04-28 ENCOUNTER — Telehealth: Payer: Self-pay | Admitting: *Deleted

## 2012-04-28 NOTE — Telephone Encounter (Signed)
Agree 

## 2012-04-28 NOTE — Telephone Encounter (Signed)
Pt.came in for her allergy shot yesterday and said the shot she was given 04-13-12 was itchy,red,feverish and the size of a softball. Kim talked to [facility] yesterday he said to take her back to 0.2 if she has no problems rebuild slowly,if she reacts again remake vaccine.

## 2012-05-01 ENCOUNTER — Ambulatory Visit (INDEPENDENT_AMBULATORY_CARE_PROVIDER_SITE_OTHER): Payer: Medicare Other

## 2012-05-01 DIAGNOSIS — J309 Allergic rhinitis, unspecified: Secondary | ICD-10-CM

## 2012-05-04 ENCOUNTER — Ambulatory Visit (INDEPENDENT_AMBULATORY_CARE_PROVIDER_SITE_OTHER)
Admission: RE | Admit: 2012-05-04 | Discharge: 2012-05-04 | Disposition: A | Discharge status: Home / Self Care | Payer: Medicare Other | Source: Ambulatory Visit | Attending: Internal Medicine | Admitting: Internal Medicine

## 2012-05-04 ENCOUNTER — Ambulatory Visit (INDEPENDENT_AMBULATORY_CARE_PROVIDER_SITE_OTHER): Payer: Medicare Other | Admitting: Internal Medicine

## 2012-05-04 ENCOUNTER — Encounter: Payer: Self-pay | Admitting: Internal Medicine

## 2012-05-04 VITALS — BP 124/60 | HR 60 | Ht 59.0 in | Wt 170.2 lb

## 2012-05-04 DIAGNOSIS — J40 Bronchitis, not specified as acute or chronic: Secondary | ICD-10-CM

## 2012-05-04 DIAGNOSIS — J301 Allergic rhinitis due to pollen: Secondary | ICD-10-CM | POA: Diagnosis not present

## 2012-05-04 DIAGNOSIS — J8410 Pulmonary fibrosis, unspecified: Secondary | ICD-10-CM | POA: Diagnosis not present

## 2012-05-04 DIAGNOSIS — R05 Cough: Secondary | ICD-10-CM | POA: Diagnosis not present

## 2012-05-04 MED ORDER — TRAMADOL HCL 50 MG PO TABS: ORAL_TABLET | ORAL | Status: DC

## 2012-05-04 NOTE — Progress Notes (Signed)
Patient ID: [name], female    DOB: December 19, 1931, 78 y.o.   MRN: [id number]  HPI 02/23/11- 89 yoF never smoker followed for asthma, allergic rhinitis, cough, complicated by GERD Last here August 21, 2010 Holding allergy vaccine at 1:500 to avoid local reactions. Her cough persists with throat tickle. Takes a lot of cough drops. Admits ongoing occasional reflux, which we discussed again. In September we discussed her GI visit with Dr Evette Cristal who had done endoscopy and recommended she take 2 Nexium- insurance won't pay. She has 3 cats- all her life. HOB is elevated.  03/09/2011 -79 yoF never smoker followed for asthma, allergic rhinitis, cough, complicated by GERD Acute visit. For 2 weeks she has had a sore throat with increased postnasal drainage worse cough watery nose. She cancelled her methacholine test because of the cough. Nose and ears are itching. We had doubled her GERD medication by adding omeprazole plus Nexium. She does admit some reflux but not heartburn. Allergy profile: 02/23/2011 IgE 14.1 with elevations for ragweed and goldenrod  04/27/11- 79 yoF never smoker followed for asthma, allergic rhinitis, cough, complicated by GERD Has had flu vaccine. Coughing is a little better. She thinks it might be worse on the days she gets her allergy shots and her son has pointed that out. She does not cough at night. Coughs in the evenings, not related to meals. She doesn't often feel herself refluxing while she takes omeprazole and sleeps with the head of her bed elevated. We again discussed the idea of "cyclic cough". She was sick today she was scheduled to have a methacholine inhalation challenge test. We will reschedule if it seems useful.  10/26/11- 79 yoF never smoker followed for asthma, allergic rhinitis, cough, complicated by GERD After concern that allergy shts were associated w/ incvreased cough, she decided she was better off on them and restarted. Now at 1:500. Some throat clearing and  postnasal drip. No overt reflux. HOB is raised. Failed benzonatate.  01/03/12 Acute OV  pt c/o dry cough x 2-3 years with little to no production. pt states she has had some wheezing at night time and some during the day time. Pt also c/o some chest congestion that comes and goes as well as PND.  pt states she completed a course of Pred with no relief. Called in steroid taper not much help  +nasal drip  No fever or discolored mucus  Never smoker. cxr  02/2011 no acute process.   02/11/12- 80 yoF never smoker followed for asthma, allergic rhinitis, cough, complicated by GERD Has been using non- menthol cough drops to help with cough. Would say cough better since last visit.  Tramadol has been some help. Fewer bad coughing spells, noted mostly after meals. We discussed reflux and cough during swallowing. Uses omeprazole and Nexium. Does occasionally feel reflux events. Occasional mild wheeze but no chest pain or shortness of breath. She feels allergy vaccine does well, currently rebuilding.  05/04/12- 80 yoF never smoker followed for asthma, allergic rhinitis, cough, complicated by GERD FOLLOWS FOR: still having slight cough-productive-slight yellow in color; recently sick. Wheezing as well. Flare of hard persistent dry coughing started while on the telephone a few days ago. She didn't recognize it as a reflux event. Cough is now better but not quite back to baseline. She continues allergy vaccine 1:50 [facility]  ROS-see HPI Constitutional:   No-   weight loss, night sweats, fevers, chills, fatigue, lassitude. HEENT:   No-  headaches, difficulty swallowing, tooth/dental  problems, sore throat,       No-  sneezing, itching, ear ache, nasal congestion, post nasal drip,  CV:  No-   chest pain, orthopnea, PND, swelling in lower extremities, anasarca,  dizziness, palpitations Resp: No-   shortness of breath with exertion or at rest.              No-   productive cough,  No non-productive cough,  No- coughing  up of blood.              No-   change in color of mucus.  No- wheezing.   Skin: No-   rash or lesions. GI:  No-   heartburn, indigestion, abdominal pain, nausea, vomiting, GU:  MS:  No-   joint pain or swelling.   Neuro-     nothing unusual Psych:  No- change in mood or affect. No depression or anxiety.  No memory loss.  OBJ- Physical Exam General- Alert, Oriented, Affect-appropriate, Distress- none acute Skin- rash-none, lesions- none, excoriation- none Lymphadenopathy- none Head- atraumatic            Eyes- Gross vision intact, PERRLA, conjunctivae and secretions clear            Ears- Hearing, canals-normal            Nose- Clear, no-Septal dev, mucus, polyps, erosion, perforation             Throat- Mallampati II , mucosa clear , drainage- none, tonsils- atrophic Neck- flexible , trachea midline, no stridor , thyroid nl, carotid no bruit Chest - symmetrical excursion , unlabored           Heart/CV- RRR , no murmur , no gallop  , no rub, nl s1 s2                           - JVD- none , edema- none, stasis changes- none, varices- none           Lung- clear to P&A, wheeze- none, + deep breath triggered cough , dullness-none, rub- none           Chest wall-  Abd-  Br/ Gen/ Rectal- Not done, not indicated Extrem- cyanosis- none, clubbing, none, atrophy- none, strength- nl Neuro- grossly intact to observation

## 2012-05-04 NOTE — Patient Instructions (Addendum)
Order- CXR   Dx bronchitis  Script sent for tramadol to try for cough  You can take Robitussin DM ( same as Robitussin Long Acting Cough and Cold)

## 2012-05-05 NOTE — Progress Notes (Signed)
Quick Note:  Spoke with pt and notified of results per Dr. Young. Pt verbalized understanding and denied any questions.  ______ 

## 2012-05-14 NOTE — Assessment & Plan Note (Signed)
Acute onset of coughing while on telephone suggests this was a reflux associated event as discussed, even though she did not perceive the reflux. Plan-she will try over-the-counter cough syrups and tramadol for cough. Maintain reflux precautions.

## 2012-05-14 NOTE — Assessment & Plan Note (Signed)
Now advanced to 1:50, watching tolerance.

## 2012-05-23 DIAGNOSIS — H2510 Age-related nuclear cataract, unspecified eye: Secondary | ICD-10-CM | POA: Diagnosis not present

## 2012-05-23 DIAGNOSIS — Z961 Presence of intraocular lens: Secondary | ICD-10-CM | POA: Diagnosis not present

## 2012-05-26 ENCOUNTER — Ambulatory Visit (INDEPENDENT_AMBULATORY_CARE_PROVIDER_SITE_OTHER): Payer: Medicare Other

## 2012-05-26 DIAGNOSIS — J309 Allergic rhinitis, unspecified: Secondary | ICD-10-CM | POA: Diagnosis not present

## 2012-06-08 ENCOUNTER — Telehealth: Payer: Self-pay | Admitting: Internal Medicine

## 2012-06-08 ENCOUNTER — Ambulatory Visit (INDEPENDENT_AMBULATORY_CARE_PROVIDER_SITE_OTHER): Payer: Medicare Other

## 2012-06-08 DIAGNOSIS — J309 Allergic rhinitis, unspecified: Secondary | ICD-10-CM | POA: Diagnosis not present

## 2012-06-08 NOTE — Telephone Encounter (Signed)
Suggest repeat at 0.32ml each vial, split doses of 1:500 for the next two times. Then if ok can try building again.

## 2012-06-08 NOTE — Telephone Encounter (Signed)
[name] had a bad reaction last time. It was a little bigger than a gold dollar,red,itchy,and swollen.(1:500 at 0.3.) She had a pinhead size reaction 05-01-12 at 1:500 at 0.2 at pt. Request I went up to 0.3 next time (the same one she reacted to so badly.) Since you weren't here I split the vials and took her back to 0.1. Please advise on what to do next.

## 2012-06-08 NOTE — Telephone Encounter (Signed)
Noted  

## 2012-06-09 DIAGNOSIS — E559 Vitamin D deficiency, unspecified: Secondary | ICD-10-CM | POA: Diagnosis not present

## 2012-06-09 DIAGNOSIS — E039 Hypothyroidism, unspecified: Secondary | ICD-10-CM | POA: Diagnosis not present

## 2012-06-09 DIAGNOSIS — E785 Hyperlipidemia, unspecified: Secondary | ICD-10-CM | POA: Diagnosis not present

## 2012-06-09 DIAGNOSIS — R7301 Impaired fasting glucose: Secondary | ICD-10-CM | POA: Diagnosis not present

## 2012-06-09 DIAGNOSIS — I10 Essential (primary) hypertension: Secondary | ICD-10-CM | POA: Diagnosis not present

## 2012-06-12 DIAGNOSIS — M899 Disorder of bone, unspecified: Secondary | ICD-10-CM | POA: Diagnosis not present

## 2012-06-12 DIAGNOSIS — R7301 Impaired fasting glucose: Secondary | ICD-10-CM | POA: Diagnosis not present

## 2012-06-12 DIAGNOSIS — I10 Essential (primary) hypertension: Secondary | ICD-10-CM | POA: Diagnosis not present

## 2012-06-12 DIAGNOSIS — R5382 Chronic fatigue, unspecified: Secondary | ICD-10-CM | POA: Diagnosis not present

## 2012-06-12 DIAGNOSIS — E039 Hypothyroidism, unspecified: Secondary | ICD-10-CM | POA: Diagnosis not present

## 2012-06-12 DIAGNOSIS — E559 Vitamin D deficiency, unspecified: Secondary | ICD-10-CM | POA: Diagnosis not present

## 2012-06-12 DIAGNOSIS — E785 Hyperlipidemia, unspecified: Secondary | ICD-10-CM | POA: Diagnosis not present

## 2012-06-13 ENCOUNTER — Encounter: Payer: Self-pay | Admitting: Internal Medicine

## 2012-06-29 ENCOUNTER — Ambulatory Visit (INDEPENDENT_AMBULATORY_CARE_PROVIDER_SITE_OTHER): Payer: Medicare Other

## 2012-06-29 DIAGNOSIS — J309 Allergic rhinitis, unspecified: Secondary | ICD-10-CM

## 2012-07-06 ENCOUNTER — Ambulatory Visit (INDEPENDENT_AMBULATORY_CARE_PROVIDER_SITE_OTHER): Payer: Medicare Other

## 2012-07-06 DIAGNOSIS — J309 Allergic rhinitis, unspecified: Secondary | ICD-10-CM | POA: Diagnosis not present

## 2012-07-13 ENCOUNTER — Ambulatory Visit: Payer: Medicare Other

## 2012-07-20 ENCOUNTER — Ambulatory Visit (INDEPENDENT_AMBULATORY_CARE_PROVIDER_SITE_OTHER): Payer: Medicare Other

## 2012-07-20 DIAGNOSIS — J309 Allergic rhinitis, unspecified: Secondary | ICD-10-CM | POA: Diagnosis not present

## 2012-07-27 ENCOUNTER — Ambulatory Visit (INDEPENDENT_AMBULATORY_CARE_PROVIDER_SITE_OTHER): Payer: Medicare Other

## 2012-07-27 DIAGNOSIS — J309 Allergic rhinitis, unspecified: Secondary | ICD-10-CM | POA: Diagnosis not present

## 2012-08-02 DIAGNOSIS — E782 Mixed hyperlipidemia: Secondary | ICD-10-CM | POA: Diagnosis not present

## 2012-08-02 DIAGNOSIS — K219 Gastro-esophageal reflux disease without esophagitis: Secondary | ICD-10-CM | POA: Diagnosis not present

## 2012-08-02 DIAGNOSIS — I4891 Unspecified atrial fibrillation: Secondary | ICD-10-CM | POA: Diagnosis not present

## 2012-08-03 ENCOUNTER — Ambulatory Visit: Payer: Medicare Other

## 2012-08-10 ENCOUNTER — Ambulatory Visit (INDEPENDENT_AMBULATORY_CARE_PROVIDER_SITE_OTHER): Payer: Medicare Other

## 2012-08-10 DIAGNOSIS — J309 Allergic rhinitis, unspecified: Secondary | ICD-10-CM | POA: Diagnosis not present

## 2012-08-17 ENCOUNTER — Ambulatory Visit (INDEPENDENT_AMBULATORY_CARE_PROVIDER_SITE_OTHER): Payer: Medicare Other

## 2012-08-17 DIAGNOSIS — J309 Allergic rhinitis, unspecified: Secondary | ICD-10-CM | POA: Diagnosis not present

## 2012-08-24 ENCOUNTER — Ambulatory Visit (INDEPENDENT_AMBULATORY_CARE_PROVIDER_SITE_OTHER): Payer: Medicare Other

## 2012-08-24 DIAGNOSIS — J309 Allergic rhinitis, unspecified: Secondary | ICD-10-CM

## 2012-08-31 ENCOUNTER — Ambulatory Visit: Payer: Medicare Other

## 2012-09-01 ENCOUNTER — Ambulatory Visit (INDEPENDENT_AMBULATORY_CARE_PROVIDER_SITE_OTHER): Payer: Medicare Other | Admitting: Internal Medicine

## 2012-09-01 ENCOUNTER — Encounter: Payer: Self-pay | Admitting: Internal Medicine

## 2012-09-01 ENCOUNTER — Ambulatory Visit (INDEPENDENT_AMBULATORY_CARE_PROVIDER_SITE_OTHER): Payer: Medicare Other

## 2012-09-01 VITALS — BP 118/58 | HR 60 | Ht 59.0 in | Wt 160.0 lb

## 2012-09-01 DIAGNOSIS — J309 Allergic rhinitis, unspecified: Secondary | ICD-10-CM

## 2012-09-01 DIAGNOSIS — J301 Allergic rhinitis due to pollen: Secondary | ICD-10-CM | POA: Diagnosis not present

## 2012-09-01 DIAGNOSIS — J209 Acute bronchitis, unspecified: Secondary | ICD-10-CM | POA: Diagnosis not present

## 2012-09-01 DIAGNOSIS — R05 Cough: Secondary | ICD-10-CM

## 2012-09-01 NOTE — Progress Notes (Signed)
Patient ID: [name], female    DOB: 1932/03/15, 79 y.o.   MRN: [id number]  HPI 02/23/11- 53 yoF never smoker followed for asthma, allergic rhinitis, cough, complicated by GERD Last here August 21, 2010 Holding allergy vaccine at 1:500 to avoid local reactions. Her cough persists with throat tickle. Takes a lot of cough drops. Admits ongoing occasional reflux, which we discussed again. In September we discussed her GI visit with Dr Penelope Coop who had done endoscopy and recommended she take 2 Nexium- insurance won't pay. She has 3 cats- all her life. HOB is elevated.  03/09/2011 -79 yoF never smoker followed for asthma, allergic rhinitis, cough, complicated by GERD Acute visit. For 2 weeks she has had a sore throat with increased postnasal drainage worse cough watery nose. She cancelled her methacholine test because of the cough. Nose and ears are itching. We had doubled her GERD medication by adding omeprazole plus Nexium. She does admit some reflux but not heartburn. Allergy profile: 02/23/2011 IgE 14.1 with elevations for ragweed and goldenrod  04/27/11- 79 yoF never smoker followed for asthma, allergic rhinitis, cough, complicated by GERD Has had flu vaccine. Coughing is a little better. She thinks it might be worse on the days she gets her allergy shots and her son has pointed that out. She does not cough at night. Coughs in the evenings, not related to meals. She doesn't often feel herself refluxing while she takes omeprazole and sleeps with the head of her bed elevated. We again discussed the idea of "cyclic cough". She was sick today she was scheduled to have a methacholine inhalation challenge test. We will reschedule if it seems useful.  10/26/11- 79 yoF never smoker followed for asthma, allergic rhinitis, cough, complicated by GERD After concern that allergy shts were associated w/ incvreased cough, she decided she was better off on them and restarted. Now at 1:500. Some throat clearing and  postnasal drip. No overt reflux. HOB is raised. Failed benzonatate.  01/03/12 Acute OV  pt c/o dry cough x 2-3 years with little to no production. pt states she has had some wheezing at night time and some during the day time. Pt also c/o some chest congestion that comes and goes as well as PND.  pt states she completed a course of Pred with no relief. Called in steroid taper not much help  +nasal drip  No fever or discolored mucus  Never smoker. cxr  02/2011 no acute process.   02/11/12- 80 yoF never smoker followed for asthma, allergic rhinitis, cough, complicated by GERD Has been using non- menthol cough drops to help with cough. Would say cough better since last visit.  Tramadol has been some help. Fewer bad coughing spells, noted mostly after meals. We discussed reflux and cough during swallowing. Uses omeprazole and Nexium. Does occasionally feel reflux events. Occasional mild wheeze but no chest pain or shortness of breath. She feels allergy vaccine does well, currently rebuilding.  05/04/12- 80 yoF never smoker followed for asthma, allergic rhinitis, cough, complicated by GERD FOLLOWS FOR: still having slight cough-productive-slight yellow in color; recently sick. Wheezing as well. Flare of hard persistent dry coughing started while on the telephone a few days ago. She didn't recognize it as a reflux event. Cough is now better but not quite back to baseline. She continues allergy vaccine 1:50 [place]  09/01/12 80 yoF never smoker followed for asthma, allergic rhinitis, cough, complicated by GERD FOLLOWS FOR: still on vaccine; sneezing, runny nose x 4 days.  Son had a cold and now she has caught it. Sick x5 days with nasal itching, sneezing. Some chills yesterday but no sore throat or fever. Continues allergy vaccine 1:50 [facility] without problems. CXR 05/05/12- IMPRESSION:  No active cardiopulmonary disease. Chronic change.  Original Report Authenticated By: Jolaine Click, M.D.  ROS-see  HPI Constitutional:   No-   weight loss, night sweats, fevers, chills, fatigue, lassitude. HEENT:   No-  headaches, difficulty swallowing, tooth/dental problems, sore throat,       No-  sneezing, itching, ear ache, nasal congestion, post nasal drip,  CV:  No-   chest pain, orthopnea, PND, swelling in lower extremities, anasarca,  dizziness, palpitations Resp: No-   shortness of breath with exertion or at rest.              No-   productive cough,  No non-productive cough,  No- coughing up of blood.              No-   change in color of mucus.  No- wheezing.   Skin: No-   rash or lesions. GI:  No-   heartburn, indigestion, abdominal pain, nausea, vomiting, GU:  MS:  No-   joint pain or swelling.   Neuro-     nothing unusual Psych:  No- change in mood or affect. No depression or anxiety.  No memory loss.  OBJ- Physical Exam General- Alert, Oriented, Affect-appropriate, Distress- none acute Skin- rash-none, lesions- none, excoriation- none Lymphadenopathy- none Head- atraumatic            Eyes- Gross vision intact, PERRLA, conjunctivae and secretions clear            Ears- Hearing, canals-normal            Nose- Clear, no-Septal dev, mucus, polyps, erosion, perforation             Throat- Mallampati II , mucosa clear , drainage- none, tonsils- atrophic Neck- flexible , trachea midline, no stridor , thyroid nl, carotid no bruit Chest - symmetrical excursion , unlabored           Heart/CV- RRR , no murmur , no gallop  , no rub, nl s1 s2                           - JVD- none , edema- none, stasis changes- none, varices- none           Lung- clear to P&A, wheeze- none, no- cough , dullness-none, rub- none           Chest wall-  Abd-  Br/ Gen/ Rectal- Not done, not indicated Extrem- cyanosis- none, clubbing, none, atrophy- none, strength- nl Neuro- grossly intact to observation

## 2012-09-01 NOTE — Patient Instructions (Addendum)
We can continue the allergy vaccine at 1:50 [facility]  Please call as needed

## 2012-09-05 NOTE — Assessment & Plan Note (Signed)
Acute viral syndrome with upper respiratory infection and tracheobronchitis. Plan-symptomatic therapy, fluids and supportive care with discussion.

## 2012-09-05 NOTE — Assessment & Plan Note (Signed)
She says her chronic cough just went away.she never did recognize that cause.

## 2012-09-05 NOTE — Assessment & Plan Note (Signed)
She is doing well with allergy vaccine 1:50 and admits that may have contributed to her improved breathing and cough although she can't be sure.

## 2012-09-07 ENCOUNTER — Ambulatory Visit: Payer: Medicare Other

## 2012-09-11 DIAGNOSIS — I10 Essential (primary) hypertension: Secondary | ICD-10-CM | POA: Diagnosis not present

## 2012-09-11 DIAGNOSIS — E559 Vitamin D deficiency, unspecified: Secondary | ICD-10-CM | POA: Diagnosis not present

## 2012-09-11 DIAGNOSIS — R5382 Chronic fatigue, unspecified: Secondary | ICD-10-CM | POA: Diagnosis not present

## 2012-09-11 DIAGNOSIS — E785 Hyperlipidemia, unspecified: Secondary | ICD-10-CM | POA: Diagnosis not present

## 2012-09-11 DIAGNOSIS — R7301 Impaired fasting glucose: Secondary | ICD-10-CM | POA: Diagnosis not present

## 2012-09-11 DIAGNOSIS — E039 Hypothyroidism, unspecified: Secondary | ICD-10-CM | POA: Diagnosis not present

## 2012-09-13 DIAGNOSIS — E785 Hyperlipidemia, unspecified: Secondary | ICD-10-CM | POA: Diagnosis not present

## 2012-09-13 DIAGNOSIS — R5382 Chronic fatigue, unspecified: Secondary | ICD-10-CM | POA: Diagnosis not present

## 2012-09-13 DIAGNOSIS — E559 Vitamin D deficiency, unspecified: Secondary | ICD-10-CM | POA: Diagnosis not present

## 2012-09-13 DIAGNOSIS — R7301 Impaired fasting glucose: Secondary | ICD-10-CM | POA: Diagnosis not present

## 2012-09-13 DIAGNOSIS — M949 Disorder of cartilage, unspecified: Secondary | ICD-10-CM | POA: Diagnosis not present

## 2012-09-13 DIAGNOSIS — I10 Essential (primary) hypertension: Secondary | ICD-10-CM | POA: Diagnosis not present

## 2012-09-13 DIAGNOSIS — E039 Hypothyroidism, unspecified: Secondary | ICD-10-CM | POA: Diagnosis not present

## 2012-09-13 DIAGNOSIS — M899 Disorder of bone, unspecified: Secondary | ICD-10-CM | POA: Diagnosis not present

## 2012-09-15 ENCOUNTER — Ambulatory Visit (INDEPENDENT_AMBULATORY_CARE_PROVIDER_SITE_OTHER): Payer: Medicare Other

## 2012-09-15 DIAGNOSIS — J309 Allergic rhinitis, unspecified: Secondary | ICD-10-CM

## 2012-09-28 ENCOUNTER — Ambulatory Visit (INDEPENDENT_AMBULATORY_CARE_PROVIDER_SITE_OTHER): Payer: Medicare Other

## 2012-09-28 DIAGNOSIS — J309 Allergic rhinitis, unspecified: Secondary | ICD-10-CM | POA: Diagnosis not present

## 2012-10-05 ENCOUNTER — Ambulatory Visit: Payer: Medicare Other

## 2012-10-06 ENCOUNTER — Telehealth: Payer: Self-pay | Admitting: Internal Medicine

## 2012-10-11 ENCOUNTER — Telehealth: Payer: Self-pay | Admitting: Internal Medicine

## 2012-10-11 NOTE — Telephone Encounter (Signed)
rec'd from [facility] forward 11 pages Dr. Joni Fears 10/11/12

## 2012-10-12 ENCOUNTER — Ambulatory Visit: Payer: Medicare Other

## 2012-10-19 ENCOUNTER — Ambulatory Visit (INDEPENDENT_AMBULATORY_CARE_PROVIDER_SITE_OTHER): Payer: Medicare Other

## 2012-10-19 DIAGNOSIS — J309 Allergic rhinitis, unspecified: Secondary | ICD-10-CM | POA: Diagnosis not present

## 2012-10-20 ENCOUNTER — Telehealth: Payer: Self-pay | Admitting: Internal Medicine

## 2012-10-20 NOTE — Telephone Encounter (Signed)
After I gave [name] her allergy shot yesterday she waited for her son 30 to 40 mins. on the bench inside.(doors opening and closing) She did have a local reaction on her left arm,red,itchy,swollen bigger than a 50 cent piece,it's oblong in shape. I ask her if her arm where the reaction is felt warm or hot she couldn't tell because her hands were cold. I also asked if she had taken benadryl,no. Pt. also said she could feel phlegm building up in the back of her throat, she couldn't sallow nor could she get it to come up. Finally she said she was able to sallow and get it down. She's still feeling congested. [name] is on 1:50,vial A at 0.3 lt.arm,vial B at 0.4 rt.arm. She has been on these doses for her last five shots not counting yesterday's. Please advise. She has an appt. At 2:45.

## 2012-10-23 NOTE — Telephone Encounter (Signed)
Probably the combination of too much outside pollen together with her allergy shots is pushing her immune system.  Suggest for next dose, drop each vial by 0.1 ml of 1:50 So Vial A 0.2 ml      Vial B 0.3 ml

## 2012-10-24 NOTE — Telephone Encounter (Signed)
Called pt. and gave her your advice she understood and agreed. She's still congested though. Any suggestions as to what she might take? Pt. has heart trouble. Please advise.

## 2012-10-24 NOTE — Telephone Encounter (Signed)
Katie- Can you call the patient. We need to understand better what she is feeling- infection, fluid overload or what??

## 2012-10-26 ENCOUNTER — Ambulatory Visit: Payer: Medicare Other

## 2012-10-26 NOTE — Telephone Encounter (Signed)
ATC patient several times-no answer and unable to leave message; will need to try again later.

## 2012-10-30 NOTE — Telephone Encounter (Signed)
LMTCB-have not been able to reach patient abut her symptoms; need to speak with her about how she is feeling.

## 2012-10-31 NOTE — Telephone Encounter (Signed)
Patient returned my call today-states she is having chest congestion and phlegm trapping in her throat; non productive cough at times with wheezing. Denies any SOB, or edema.   Spoke with CY about this; states patient can continue getting allergy shots on schedule and lets give her a sample of Dymista nasal spray; will leave in allergy to explain/teach patient when she comes in Thursday. Pt is aware. Sample left in allergy with [facility].

## 2012-11-02 ENCOUNTER — Ambulatory Visit (INDEPENDENT_AMBULATORY_CARE_PROVIDER_SITE_OTHER): Payer: Medicare Other

## 2012-11-02 DIAGNOSIS — J309 Allergic rhinitis, unspecified: Secondary | ICD-10-CM

## 2012-11-09 ENCOUNTER — Ambulatory Visit (INDEPENDENT_AMBULATORY_CARE_PROVIDER_SITE_OTHER): Payer: Medicare Other

## 2012-11-09 DIAGNOSIS — J309 Allergic rhinitis, unspecified: Secondary | ICD-10-CM

## 2012-11-16 ENCOUNTER — Ambulatory Visit (INDEPENDENT_AMBULATORY_CARE_PROVIDER_SITE_OTHER): Payer: Medicare Other

## 2012-11-16 DIAGNOSIS — J309 Allergic rhinitis, unspecified: Secondary | ICD-10-CM

## 2012-11-22 ENCOUNTER — Other Ambulatory Visit: Payer: Self-pay | Admitting: *Deleted

## 2012-11-22 MED ORDER — DILTIAZEM HCL ER 180 MG PO CP24: 180.0000 mg | ORAL_CAPSULE | Freq: Every day | ORAL | Status: DC

## 2012-11-23 ENCOUNTER — Encounter: Payer: Self-pay | Admitting: Physician Assistant

## 2012-11-23 ENCOUNTER — Ambulatory Visit (INDEPENDENT_AMBULATORY_CARE_PROVIDER_SITE_OTHER): Payer: Medicare Other | Admitting: Physician Assistant

## 2012-11-23 ENCOUNTER — Ambulatory Visit: Payer: Medicare Other

## 2012-11-23 VITALS — BP 130/50 | HR 70 | Ht 59.0 in | Wt 154.0 lb

## 2012-11-23 DIAGNOSIS — Z950 Presence of cardiac pacemaker: Secondary | ICD-10-CM

## 2012-11-23 DIAGNOSIS — I959 Hypotension, unspecified: Secondary | ICD-10-CM | POA: Diagnosis not present

## 2012-11-23 NOTE — Patient Instructions (Signed)
Check the blood pressure maybe once daily at different times for a week. If blood pressures consistently below 100 systolic, call our office and we can adjust your medicine.

## 2012-11-23 NOTE — Assessment & Plan Note (Signed)
Patient reports her blood pressure yesterday of 87/40 at [facility].  Today the office is 130/50. As the patient to monitor blood pressure periodically throughout the week at different times a day using her home blood pressure cuff. It is consistently under 100 systolic I have instructed her to call the office for medication adjustment.

## 2012-11-23 NOTE — Progress Notes (Signed)
Date:  11/23/2012   ID:  [name], DOB 05-19-1932, MRN [id number]  PCP:  Junious Silk, MD  Primary Cardiologist:  Tresa Endo    History of Present Illness: [name] is a 79 y.o. female history of pacemaker implant for sinus node dysfunction and complete heart block. The device is a Engineer, structural model [id number] serial number [id number] implanted 11/20/2007. The atrial lead is a St. Jude [id number] the ventricular lead is a Engineer, water. Jude 1688 TC. Patient's history also includes hypertension, hyperlipidemia remote paroxysmal intrafibrillation. The patient is in today with complaints of low blood pressure. [facility]. She also reports some mild dizziness. Her son reports that she does not drink very much water during the day because she doesn't want to be going to the bathroom. The patient currently denies nausea, vomiting, fever, chest pain, shortness of breath, orthopnea, PND, cough, congestion, abdominal pain, hematochezia, melena, lower extremity edema.    Wt Readings from Last 3 Encounters:  11/23/12 154 lb (69.854 kg)  09/01/12 160 lb (72.576 kg)  05/04/12 170 lb 3.2 oz (77.202 kg)     Past Medical History  Diagnosis Date  . Esophageal reflux   . Allergic rhinitis   . Asthmatic bronchitis   . Chronic cough     Current Outpatient Prescriptions  Medication Sig Dispense Refill  . aspirin 81 MG tablet Take 81 mg by mouth daily.        . cholecalciferol (VITAMIN D) 1000 UNITS tablet Take 1,000 Units by mouth daily.        Marland Kitchen diltiazem (DILACOR XR) 180 MG 24 hr capsule Take 1 capsule (180 mg total) by mouth daily.  30 capsule  11  . DIOVAN HCT 320-25 MG per tablet Take 1 tablet by mouth daily.      Marland Kitchen esomeprazole (NEXIUM) 40 MG capsule Take 40 mg by mouth daily before breakfast.        . furosemide (LASIX) 20 MG tablet Take 20 mg by mouth. 2 times weekly       . LORazepam (ATIVAN) 0.5 MG tablet Take 0.5 mg by mouth 2 (two) times daily as needed.        .  metoprolol (TOPROL-XL) 50 MG 24 hr tablet Take 50 mg by mouth 2 (two) times daily.       . Multiple Vitamin (MULTIVITAMIN) tablet Take 1 tablet by mouth daily.        . simvastatin (ZOCOR) 20 MG tablet Take 10 mg by mouth at bedtime.        No current facility-administered medications for this visit.   Family History  Problem Relation Age of Onset  . Stroke Mother   . Cancer Father     bladder  . Stroke Sister     Allergies:    Allergies  Allergen Reactions  . Fluticasone-Salmeterol   . Sulfonamide Derivatives     Social History:  The patient  reports that she has never smoked. She does not have any smokeless tobacco history on file. She reports that she does not drink alcohol.   ROS:  Please see the history of present illness.  All other systems reviewed and negative.   PHYSICAL EXAM: VS:  BP 130/50  Pulse 70  Ht 4\' 11"  (1.499 m)  Wt 154 lb (69.854 kg)  BMI 31.09 kg/m2 Well nourished, well developed, in no acute distress HEENT: Pupils are equal round react to light accommodation extraocular movements are intact.  Neck: no  JVDNo cervical lymphadenopathy. Cardiac: Regular rate and rhythm without murmurs rubs or gallops. Lungs:  clear to auscultation bilaterally, no wheezing, rhonchi or rales Abd: soft, nontender, positive bowel sounds all quadrants, no hepatosplenomegaly Ext: no lower extremity edema.  2+ radial and dorsalis pedis pulses. Skin: warm and dry Neuro:  Grossly normal  EKG:  Paced     ASSESSMENT AND PLAN:  Problem List Items Addressed This Visit   Hypotension - Primary     Patient reports [facility]  Today the office is 130/50. As the patient to monitor blood pressure periodically throughout the week at different times a day using her home blood pressure cuff. It is consistently under 100 systolic I have instructed her to call the office for medication adjustment.

## 2012-11-24 ENCOUNTER — Ambulatory Visit (INDEPENDENT_AMBULATORY_CARE_PROVIDER_SITE_OTHER): Payer: Medicare Other

## 2012-11-24 DIAGNOSIS — J309 Allergic rhinitis, unspecified: Secondary | ICD-10-CM | POA: Diagnosis not present

## 2012-11-27 DIAGNOSIS — D3130 Benign neoplasm of unspecified choroid: Secondary | ICD-10-CM | POA: Diagnosis not present

## 2012-11-27 DIAGNOSIS — H35369 Drusen (degenerative) of macula, unspecified eye: Secondary | ICD-10-CM | POA: Diagnosis not present

## 2012-11-27 DIAGNOSIS — H2510 Age-related nuclear cataract, unspecified eye: Secondary | ICD-10-CM | POA: Diagnosis not present

## 2012-11-27 DIAGNOSIS — H353190 Nonexudative age-related macular degeneration, unspecified eye, stage unspecified: Secondary | ICD-10-CM | POA: Diagnosis not present

## 2012-11-30 ENCOUNTER — Ambulatory Visit (INDEPENDENT_AMBULATORY_CARE_PROVIDER_SITE_OTHER): Payer: Medicare Other

## 2012-11-30 DIAGNOSIS — J309 Allergic rhinitis, unspecified: Secondary | ICD-10-CM | POA: Diagnosis not present

## 2012-12-01 ENCOUNTER — Ambulatory Visit: Payer: Medicare Other

## 2012-12-07 ENCOUNTER — Ambulatory Visit: Payer: Medicare Other

## 2012-12-08 ENCOUNTER — Ambulatory Visit (INDEPENDENT_AMBULATORY_CARE_PROVIDER_SITE_OTHER): Payer: Medicare Other

## 2012-12-08 DIAGNOSIS — J309 Allergic rhinitis, unspecified: Secondary | ICD-10-CM | POA: Diagnosis not present

## 2012-12-14 ENCOUNTER — Ambulatory Visit: Payer: Medicare Other

## 2012-12-18 DIAGNOSIS — I10 Essential (primary) hypertension: Secondary | ICD-10-CM | POA: Diagnosis not present

## 2012-12-18 DIAGNOSIS — R7301 Impaired fasting glucose: Secondary | ICD-10-CM | POA: Diagnosis not present

## 2012-12-18 DIAGNOSIS — E039 Hypothyroidism, unspecified: Secondary | ICD-10-CM | POA: Diagnosis not present

## 2012-12-18 DIAGNOSIS — E785 Hyperlipidemia, unspecified: Secondary | ICD-10-CM | POA: Diagnosis not present

## 2012-12-20 DIAGNOSIS — E039 Hypothyroidism, unspecified: Secondary | ICD-10-CM | POA: Diagnosis not present

## 2012-12-20 DIAGNOSIS — M949 Disorder of cartilage, unspecified: Secondary | ICD-10-CM | POA: Diagnosis not present

## 2012-12-20 DIAGNOSIS — R5382 Chronic fatigue, unspecified: Secondary | ICD-10-CM | POA: Diagnosis not present

## 2012-12-20 DIAGNOSIS — M899 Disorder of bone, unspecified: Secondary | ICD-10-CM | POA: Diagnosis not present

## 2012-12-20 DIAGNOSIS — E559 Vitamin D deficiency, unspecified: Secondary | ICD-10-CM | POA: Diagnosis not present

## 2012-12-20 DIAGNOSIS — R7301 Impaired fasting glucose: Secondary | ICD-10-CM | POA: Diagnosis not present

## 2012-12-20 DIAGNOSIS — E785 Hyperlipidemia, unspecified: Secondary | ICD-10-CM | POA: Diagnosis not present

## 2012-12-20 DIAGNOSIS — I10 Essential (primary) hypertension: Secondary | ICD-10-CM | POA: Diagnosis not present

## 2012-12-21 ENCOUNTER — Ambulatory Visit (INDEPENDENT_AMBULATORY_CARE_PROVIDER_SITE_OTHER): Payer: Medicare Other

## 2012-12-21 DIAGNOSIS — J309 Allergic rhinitis, unspecified: Secondary | ICD-10-CM | POA: Diagnosis not present

## 2012-12-25 ENCOUNTER — Telehealth: Payer: Self-pay | Admitting: Internal Medicine

## 2012-12-25 NOTE — Telephone Encounter (Signed)
Received 10 pages from [facility], sent to Dr. Maple Hudson. 12/25/12/ss

## 2012-12-27 ENCOUNTER — Encounter: Payer: Self-pay | Admitting: Cardiovascular Disease

## 2013-01-04 ENCOUNTER — Ambulatory Visit (INDEPENDENT_AMBULATORY_CARE_PROVIDER_SITE_OTHER): Payer: Medicare Other

## 2013-01-04 DIAGNOSIS — J309 Allergic rhinitis, unspecified: Secondary | ICD-10-CM

## 2013-01-09 ENCOUNTER — Other Ambulatory Visit: Payer: Self-pay | Admitting: *Deleted

## 2013-01-09 MED ORDER — METOPROLOL SUCCINATE ER 50 MG PO TB24: 50.0000 mg | ORAL_TABLET | Freq: Two times a day (BID) | ORAL | Status: DC

## 2013-01-11 ENCOUNTER — Ambulatory Visit (INDEPENDENT_AMBULATORY_CARE_PROVIDER_SITE_OTHER): Payer: Medicare Other

## 2013-01-11 DIAGNOSIS — J309 Allergic rhinitis, unspecified: Secondary | ICD-10-CM

## 2013-01-18 ENCOUNTER — Ambulatory Visit: Payer: Medicare Other

## 2013-01-24 ENCOUNTER — Encounter: Payer: Self-pay | Admitting: Cardiovascular Disease

## 2013-01-24 ENCOUNTER — Ambulatory Visit (INDEPENDENT_AMBULATORY_CARE_PROVIDER_SITE_OTHER): Payer: Medicare Other | Admitting: Cardiovascular Disease

## 2013-01-24 ENCOUNTER — Encounter: Payer: Self-pay | Admitting: *Deleted

## 2013-01-24 VITALS — BP 118/68 | HR 70 | Ht 59.0 in | Wt 150.0 lb

## 2013-01-24 DIAGNOSIS — K219 Gastro-esophageal reflux disease without esophagitis: Secondary | ICD-10-CM | POA: Diagnosis not present

## 2013-01-24 DIAGNOSIS — E785 Hyperlipidemia, unspecified: Secondary | ICD-10-CM | POA: Diagnosis not present

## 2013-01-24 DIAGNOSIS — Z950 Presence of cardiac pacemaker: Secondary | ICD-10-CM | POA: Diagnosis not present

## 2013-01-24 DIAGNOSIS — I10 Essential (primary) hypertension: Secondary | ICD-10-CM | POA: Diagnosis not present

## 2013-01-24 NOTE — Patient Instructions (Addendum)
Your physician recommends that you schedule a follow-up appointment in: 1 year  

## 2013-01-25 ENCOUNTER — Ambulatory Visit (INDEPENDENT_AMBULATORY_CARE_PROVIDER_SITE_OTHER): Payer: Medicare Other

## 2013-01-25 DIAGNOSIS — J309 Allergic rhinitis, unspecified: Secondary | ICD-10-CM

## 2013-02-01 ENCOUNTER — Ambulatory Visit: Payer: Medicare Other

## 2013-02-02 ENCOUNTER — Ambulatory Visit (INDEPENDENT_AMBULATORY_CARE_PROVIDER_SITE_OTHER): Payer: Medicare Other

## 2013-02-02 DIAGNOSIS — J309 Allergic rhinitis, unspecified: Secondary | ICD-10-CM | POA: Diagnosis not present

## 2013-02-04 ENCOUNTER — Encounter: Payer: Self-pay | Admitting: Cardiovascular Disease

## 2013-02-04 DIAGNOSIS — E785 Hyperlipidemia, unspecified: Secondary | ICD-10-CM | POA: Insufficient documentation

## 2013-02-04 DIAGNOSIS — I10 Essential (primary) hypertension: Secondary | ICD-10-CM | POA: Insufficient documentation

## 2013-02-04 DIAGNOSIS — Z950 Presence of cardiac pacemaker: Secondary | ICD-10-CM | POA: Insufficient documentation

## 2013-02-04 DIAGNOSIS — E782 Mixed hyperlipidemia: Secondary | ICD-10-CM | POA: Insufficient documentation

## 2013-02-04 NOTE — Progress Notes (Signed)
Patient ID: [name] [name], female   DOB: [birth year], 79 y.o.   MRN: [id number]     HPI: [name] [name], is a 79 y.o. female who presents to the office today for six-month cardiology evaluation.  [name] [name] is now 79 years old. She is status post dual-chamber pacemaker implantation for complete heart block. She has a history of paroxysmal atrial fibrillation, a history of lower 70 edema as well as GERD. She also has a history of hypertension as well as hyperlipidemia. Her last pacemaker interrogation was done in April 2014 by Dr. Royann Shivers. She tells me she recently had laboratory done by Dr. Kyra Searles in June. Presently, she denies chest pain. She believes her chest pain has been controlled. She is unaware of any recurrent tachycardia palpitations. She denies significant leg swelling.  Past Medical History  Diagnosis Date  . Esophageal reflux   . Allergic rhinitis   . Asthmatic bronchitis   . Chronic cough   . Hypertension   . Hyperlipemia   . PAF (paroxysmal atrial fibrillation)   . GERD (gastroesophageal reflux disease)   . Hx of echocardiogram 11/05/2010    EF>55% showed a normal systolic function with impaired diastolic dysfuntion and also tissue doppler suggesting increased elevated left atrial pressure, she had moderate LA dilatation and mild LA dilatation. There was calcified mitral apparatus with moderate MR, mild to moderate TR and trace aortic insufficiency.  . History of stress test 11/20/2009    This was essentially normal,post-stress EF was hyperdynamic at 73 beats per mins, There was no scar or ischemia.    Past Surgical History  Procedure Laterality Date  . Pacemaker insertion  June 1st 2009    St Jude [place] device, model #4098, serial [id number], The artial lead is a Lexicographer, the ventricular lead is Runner, broadcasting/film/video.  Marland Kitchen Foot surgery      right  . Thyroid surgery    . Vesicovaginal fistula closure w/ tah    . Cholecystectomy    . Tonsillectomy    . Nasal septum  surgery      Allergies  Allergen Reactions  . Fluticasone-Salmeterol   . Sulfonamide Derivatives     Current Outpatient Prescriptions  Medication Sig Dispense Refill  . amoxicillin (AMOXIL) 500 MG capsule As directed for tooth      . aspirin 81 MG tablet Take 81 mg by mouth daily.        . cholecalciferol (VITAMIN D) 1000 UNITS tablet Take 1,000 Units by mouth daily.        Marland Kitchen diltiazem (DILACOR XR) 180 MG 24 hr capsule Take 1 capsule (180 mg total) by mouth daily.  30 capsule  11  . DIOVAN HCT 320-25 MG per tablet Take 1 tablet by mouth daily.      Marland Kitchen esomeprazole (NEXIUM) 40 MG capsule Take 40 mg by mouth daily before breakfast.        . furosemide (LASIX) 20 MG tablet Take 20 mg by mouth. 2 times weekly       . LORazepam (ATIVAN) 0.5 MG tablet Take 0.5 mg by mouth 2 (two) times daily as needed.        . metoprolol succinate (TOPROL-XL) 50 MG 24 hr tablet Take 1 tablet (50 mg total) by mouth 2 (two) times daily.  60 tablet  6  . Multiple Vitamin (MULTIVITAMIN) tablet Take 1 tablet by mouth daily.        . simvastatin (ZOCOR) 20 MG tablet Take 10 mg by mouth at bedtime.  No current facility-administered medications for this visit.    History   Social History  . Marital Status: Married    Spouse Name: N/A    Number of Children: N/A  . Years of Education: N/A   Occupational History  .      Retired Heritage manager at [employer]   Social History Main Topics  . Smoking status: Never Smoker   . Smokeless tobacco: Not on file  . Alcohol Use: No  . Drug Use: Not on file  . Sexual Activity: Not on file   Other Topics Concern  . Not on file   Social History Narrative  . No narrative on file    Family History  Problem Relation Age of Onset  . Stroke Mother   . Cancer Father     bladder  . Stroke Sister    Additional social history is notable that she is widowed and has one daughter and 2 sons.  ROS is negative for fevers, chills or night sweats. She  denies difficulty with sleep. She denies PND orthopnea. She denies presyncope or syncope. She does have GERD but this has been fairly well controlled with Nexium. She denies chest pressure. She is unaware of bleeding. She is unaware of leg swelling. She denies claudication symptoms.  Other system review is negative.  [name] BP 118/68  Pulse 70  Ht 4\' 11"  (1.499 m)  Wt 150 lb (68.04 kg)  BMI 30.28 kg/m2  General: Alert, oriented, no distress.  Skin: normal turgor, no rashes HEENT: Normocephalic, atraumatic. Pupils round and reactive; sclera anicteric;no lid lag.  Nose without nasal septal hypertrophy Mouth/Parynx benign; Mallinpatti scale 2 Neck: No JVD, no carotid briuts Lungs: clear to ausculatation and percussion; no wheezing or rales Heart: RRR, s1 s2 normal 1/6 systolic murmur Abdomen: Mild central'. soft, nontender; no hepatosplenomehaly, BS+; abdominal aorta nontender and not dilated by palpation. Pulses 2+ Extremities: no clubbing cyanosis or edema, Homan's sign negative  Neurologic: grossly nonfocal  ECG: 80 paced rhythm at 70 beats per minute. PR interval 222 ms.  LABS:  BMET    Component Value Date/Time   NA 139 11/28/2007 0351   K 3.8 11/28/2007 0351   CL 104 11/28/2007 0351   CO2 27 11/28/2007 0351   GLUCOSE 154* 11/28/2007 0351   BUN 16 11/28/2007 0351   CREATININE 0.96 11/28/2007 0351   CALCIUM 8.7 11/28/2007 0351   GFRNONAA 57* 11/28/2007 0351   GFRAA  Value: >60        The eGFR has been calculated using the MDRD equation. This calculation has not been validated in all clinical 11/28/2007 0351     Hepatic Function Panel     Component Value Date/Time   PROT 5.9* 11/28/2007 0351   ALBUMIN 2.6* 11/28/2007 0351   AST 21 11/28/2007 0351   ALT 29 11/28/2007 0351   ALKPHOS 61 11/28/2007 0351   BILITOT 0.4 11/28/2007 0351     CBC    Component Value Date/Time   WBC 8.7 11/28/2007 0351   RBC 3.13* 11/28/2007 0351   HGB 9.7* 11/28/2007 0351   HCT 28.5* 11/28/2007 0351   PLT 183 11/28/2007 0351    MCV 91.2 11/28/2007 0351   MCHC 33.9 11/28/2007 0351   RDW 12.6 11/28/2007 0351   LYMPHSABS 2.0 11/28/2007 0351   MONOABS 0.8 11/28/2007 0351   EOSABS 0.6 11/28/2007 0351   BASOSABS 0.0 11/28/2007 0351     BNP    Component Value Date/Time   PROBNP 107.0* 11/28/2007  5784    Lipid Panel     Component Value Date/Time   CHOL  Value: 153        ATP III CLASSIFICATION:  <200     mg/dL   Desirable  696-295  mg/dL   Borderline High  >=284    mg/dL   High 1/32/4401 0272   TRIG 87 11/19/2007 0700   HDL 59 11/19/2007 0700   CHOLHDL 2.6 11/19/2007 0700   VLDL 17 11/19/2007 0700   LDLCALC  Value: 77        Total Cholesterol/HDL:CHD Risk Coronary Heart Disease Risk Table                     Men   Women  1/2 Average Risk   3.4   3.3 11/19/2007 0700     RADIOLOGY: No results found.    ASSESSMENT AND PLAN: From a cardiac standpoint, Ms. Guimond continues to do well. Her blood pressure is well controlled and she is tolerating Diovan HCT 320/25. She also is on diltiazem 180 mg as well as furosemide which she takes 2 times per week in addition to beta blocker therapy. She is unaware of any breakthrough episodes of atrial fibrillation. Her pacemaker is functioning well. I will review the laboratory done by Dr. Altheiimer. Presently have not made adjustments in her medical regimen. I will see her in one year for followup evaluation or sooner if problems arise.     Lennette Bihari, MD, [facility]  02/04/2013 11:29 AM

## 2013-02-07 ENCOUNTER — Telehealth: Payer: Self-pay | Admitting: Cardiovascular Disease

## 2013-02-07 NOTE — Telephone Encounter (Signed)
Patient wants to know if she still needs to be seen every 3 months for her pacemaker checks.

## 2013-02-08 ENCOUNTER — Ambulatory Visit (INDEPENDENT_AMBULATORY_CARE_PROVIDER_SITE_OTHER): Payer: Medicare Other

## 2013-02-08 DIAGNOSIS — J309 Allergic rhinitis, unspecified: Secondary | ICD-10-CM

## 2013-02-09 ENCOUNTER — Ambulatory Visit (INDEPENDENT_AMBULATORY_CARE_PROVIDER_SITE_OTHER): Payer: Medicare Other | Admitting: *Deleted

## 2013-02-09 DIAGNOSIS — I442 Atrioventricular block, complete: Secondary | ICD-10-CM

## 2013-02-09 LAB — PACEMAKER DEVICE OBSERVATION
ATRIAL PACING PM: 99
BAMS-0001: 150 {beats}/min
BAMS-0003: 70 {beats}/min
DEVICE MODEL PM: 2109576
RV LEAD IMPEDENCE PM: 508 Ohm
VENTRICULAR PACING PM: 99

## 2013-02-09 NOTE — Telephone Encounter (Signed)
Spoke to patient yesterday, and scheduled an appointment for today @10 :00am for her ppm check.

## 2013-02-09 NOTE — Patient Instructions (Addendum)
Your physician recommends that you schedule a follow-up appointment in: 3 months.  

## 2013-02-09 NOTE — Progress Notes (Signed)
In office pacemaker interrogation. Normal device function. Minimal changes made this session. 

## 2013-02-15 ENCOUNTER — Ambulatory Visit (INDEPENDENT_AMBULATORY_CARE_PROVIDER_SITE_OTHER): Payer: Medicare Other

## 2013-02-15 DIAGNOSIS — J309 Allergic rhinitis, unspecified: Secondary | ICD-10-CM | POA: Diagnosis not present

## 2013-02-22 ENCOUNTER — Ambulatory Visit (INDEPENDENT_AMBULATORY_CARE_PROVIDER_SITE_OTHER): Payer: Medicare Other

## 2013-02-22 DIAGNOSIS — J309 Allergic rhinitis, unspecified: Secondary | ICD-10-CM

## 2013-03-01 ENCOUNTER — Ambulatory Visit: Payer: Medicare Other

## 2013-03-02 ENCOUNTER — Ambulatory Visit (INDEPENDENT_AMBULATORY_CARE_PROVIDER_SITE_OTHER): Payer: Medicare Other

## 2013-03-02 DIAGNOSIS — J309 Allergic rhinitis, unspecified: Secondary | ICD-10-CM

## 2013-03-06 ENCOUNTER — Ambulatory Visit (INDEPENDENT_AMBULATORY_CARE_PROVIDER_SITE_OTHER): Payer: Medicare Other

## 2013-03-06 ENCOUNTER — Ambulatory Visit (INDEPENDENT_AMBULATORY_CARE_PROVIDER_SITE_OTHER): Payer: Medicare Other | Admitting: Internal Medicine

## 2013-03-06 ENCOUNTER — Encounter: Payer: Self-pay | Admitting: Internal Medicine

## 2013-03-06 VITALS — BP 102/58 | HR 70 | Ht 59.0 in | Wt 150.2 lb

## 2013-03-06 DIAGNOSIS — J45909 Unspecified asthma, uncomplicated: Secondary | ICD-10-CM

## 2013-03-06 DIAGNOSIS — J309 Allergic rhinitis, unspecified: Secondary | ICD-10-CM

## 2013-03-06 DIAGNOSIS — J301 Allergic rhinitis due to pollen: Secondary | ICD-10-CM

## 2013-03-06 NOTE — Progress Notes (Signed)
Patient ID: [name], female    DOB: 1932/03/15, 79 y.o.   MRN: [id number]  HPI 02/23/11- 53 yoF never smoker followed for asthma, allergic rhinitis, cough, complicated by GERD Last here August 21, 2010 Holding allergy vaccine at 1:500 to avoid local reactions. Her cough persists with throat tickle. Takes a lot of cough drops. Admits ongoing occasional reflux, which we discussed again. In September we discussed her GI visit with Dr Penelope Coop who had done endoscopy and recommended she take 2 Nexium- insurance won't pay. She has 3 cats- all her life. HOB is elevated.  03/09/2011 -79 yoF never smoker followed for asthma, allergic rhinitis, cough, complicated by GERD Acute visit. For 2 weeks she has had a sore throat with increased postnasal drainage worse cough watery nose. She cancelled her methacholine test because of the cough. Nose and ears are itching. We had doubled her GERD medication by adding omeprazole plus Nexium. She does admit some reflux but not heartburn. Allergy profile: 02/23/2011 IgE 14.1 with elevations for ragweed and goldenrod  04/27/11- 79 yoF never smoker followed for asthma, allergic rhinitis, cough, complicated by GERD Has had flu vaccine. Coughing is a little better. She thinks it might be worse on the days she gets her allergy shots and her son has pointed that out. She does not cough at night. Coughs in the evenings, not related to meals. She doesn't often feel herself refluxing while she takes omeprazole and sleeps with the head of her bed elevated. We again discussed the idea of "cyclic cough". She was sick today she was scheduled to have a methacholine inhalation challenge test. We will reschedule if it seems useful.  10/26/11- 79 yoF never smoker followed for asthma, allergic rhinitis, cough, complicated by GERD After concern that allergy shts were associated w/ incvreased cough, she decided she was better off on them and restarted. Now at 1:500. Some throat clearing and  postnasal drip. No overt reflux. HOB is raised. Failed benzonatate.  01/03/12 Acute OV  pt c/o dry cough x 2-3 years with little to no production. pt states she has had some wheezing at night time and some during the day time. Pt also c/o some chest congestion that comes and goes as well as PND.  pt states she completed a course of Pred with no relief. Called in steroid taper not much help  +nasal drip  No fever or discolored mucus  Never smoker. cxr  02/2011 no acute process.   02/11/12- 79 yoF never smoker followed for asthma, allergic rhinitis, cough, complicated by GERD Has been using non- menthol cough drops to help with cough. Would say cough better since last visit.  Tramadol has been some help. Fewer bad coughing spells, noted mostly after meals. We discussed reflux and cough during swallowing. Uses omeprazole and Nexium. Does occasionally feel reflux events. Occasional mild wheeze but no chest pain or shortness of breath. She feels allergy vaccine does well, currently rebuilding.  05/04/12- 79 yoF never smoker followed for asthma, allergic rhinitis, cough, complicated by GERD FOLLOWS FOR: still having slight cough-productive-slight yellow in color; recently sick. Wheezing as well. Flare of hard persistent dry coughing started while on the telephone a few days ago. She didn't recognize it as a reflux event. Cough is now better but not quite back to baseline. She continues allergy vaccine 1:50 [place]  09/01/12 79 yoF never smoker followed for asthma, allergic rhinitis, cough, complicated by GERD FOLLOWS FOR: still on vaccine; sneezing, runny nose x 4 days.  Son had a cold and now she has caught it. Sick x5 days with nasal itching, sneezing. Some chills yesterday but no sore throat or fever. Continues allergy vaccine 1:50 GH without problems. CXR 05/05/12- IMPRESSION:  No active cardiopulmonary disease. Chronic change.  Original Report Authenticated By: Jolaine Click, M.D.  03/06/13- 79 yoF  never smoker followed for asthma, allergic rhinitis, cough, complicated by GERD FOLLOWS XBJ:YNWGN well,still gets Allergy vaccine 1:50 GH,had today,tickle in throat,denies cough,sob,wheeze Gets her flu shot from her primary physician. We talked about what to do with allergy vaccine. She gets injections from 2 divided vials and the ragweed side usually itches some.  ROS-see HPI Constitutional:   No-   weight loss, night sweats, fevers, chills, fatigue, lassitude. HEENT:   No-  headaches, difficulty swallowing, tooth/dental problems, sore throat,       No-  sneezing, itching, ear ache, nasal congestion, post nasal drip,  CV:  No-   chest pain, orthopnea, PND, swelling in lower extremities, anasarca,  dizziness, palpitations Resp: No-   shortness of breath with exertion or at rest.              No-   productive cough,  No non-productive cough,  No- coughing up of blood.              No-   change in color of mucus.  No- wheezing.   Skin: No-   rash or lesions. GI:  No-   heartburn, indigestion, abdominal pain, nausea, vomiting, GU:  MS:  No-   joint pain or swelling.   Neuro-     nothing unusual Psych:  No- change in mood or affect. No depression or anxiety.  No memory loss.  OBJ- Physical Exam General- Alert, Oriented, Affect-appropriate, Distress- none acute Skin- rash-none, lesions- none, excoriation- none Lymphadenopathy- none Head- atraumatic            Eyes- Gross vision intact, PERRLA, conjunctivae and secretions clear            Ears- Hearing, canals-normal            Nose- Clear, no-Septal dev, mucus, polyps, erosion, perforation             Throat- Mallampati II , mucosa clear , drainage- none, tonsils- atrophic Neck- flexible , trachea midline, no stridor , thyroid nl, carotid no bruit Chest - symmetrical excursion , unlabored           Heart/CV- RRR , no murmur , no gallop  , no rub, nl s1 s2                           - JVD- none , edema- none, stasis changes- none, varices-  none           Lung- clear to P&A, wheeze- none, no- cough , dullness-none, rub- none           Chest wall-  Abd-  Br/ Gen/ Rectal- Not done, not indicated Extrem- cyanosis- none, clubbing, none, atrophy- none, strength- nl Neuro- grossly intact to observation

## 2013-03-06 NOTE — Patient Instructions (Addendum)
We can continue allergy vaccine 1:50 GH, but can try getting shots every other week  Ok still to take an antihistamine if needed  Don't forget to get that flu shot this fall !  Please call as needed

## 2013-03-11 NOTE — Assessment & Plan Note (Signed)
Stable with good control 

## 2013-03-11 NOTE — Assessment & Plan Note (Signed)
Considered options for allergy vaccine. I told her it would be okay to stop. She is going to try continuing, every other week for a while.

## 2013-03-13 ENCOUNTER — Other Ambulatory Visit: Payer: Self-pay

## 2013-03-13 DIAGNOSIS — Z1231 Encounter for screening mammogram for malignant neoplasm of breast: Secondary | ICD-10-CM

## 2013-03-22 ENCOUNTER — Ambulatory Visit: Payer: Medicare Other

## 2013-03-23 DIAGNOSIS — E785 Hyperlipidemia, unspecified: Secondary | ICD-10-CM | POA: Diagnosis not present

## 2013-03-23 DIAGNOSIS — I10 Essential (primary) hypertension: Secondary | ICD-10-CM | POA: Diagnosis not present

## 2013-03-23 DIAGNOSIS — R7301 Impaired fasting glucose: Secondary | ICD-10-CM | POA: Diagnosis not present

## 2013-03-23 DIAGNOSIS — E559 Vitamin D deficiency, unspecified: Secondary | ICD-10-CM | POA: Diagnosis not present

## 2013-03-28 DIAGNOSIS — M899 Disorder of bone, unspecified: Secondary | ICD-10-CM | POA: Diagnosis not present

## 2013-03-28 DIAGNOSIS — E559 Vitamin D deficiency, unspecified: Secondary | ICD-10-CM | POA: Diagnosis not present

## 2013-03-28 DIAGNOSIS — E785 Hyperlipidemia, unspecified: Secondary | ICD-10-CM | POA: Diagnosis not present

## 2013-03-28 DIAGNOSIS — E039 Hypothyroidism, unspecified: Secondary | ICD-10-CM | POA: Diagnosis not present

## 2013-03-28 DIAGNOSIS — R7301 Impaired fasting glucose: Secondary | ICD-10-CM | POA: Diagnosis not present

## 2013-03-28 DIAGNOSIS — Z23 Encounter for immunization: Secondary | ICD-10-CM | POA: Diagnosis not present

## 2013-03-28 DIAGNOSIS — I10 Essential (primary) hypertension: Secondary | ICD-10-CM | POA: Diagnosis not present

## 2013-03-28 DIAGNOSIS — R5381 Other malaise: Secondary | ICD-10-CM | POA: Diagnosis not present

## 2013-04-04 ENCOUNTER — Encounter: Payer: Self-pay | Admitting: Cardiovascular Disease

## 2013-04-05 ENCOUNTER — Ambulatory Visit (INDEPENDENT_AMBULATORY_CARE_PROVIDER_SITE_OTHER): Payer: Medicare Other

## 2013-04-05 DIAGNOSIS — J309 Allergic rhinitis, unspecified: Secondary | ICD-10-CM

## 2013-04-19 ENCOUNTER — Ambulatory Visit (INDEPENDENT_AMBULATORY_CARE_PROVIDER_SITE_OTHER): Payer: Medicare Other

## 2013-04-19 DIAGNOSIS — J309 Allergic rhinitis, unspecified: Secondary | ICD-10-CM

## 2013-04-26 ENCOUNTER — Ambulatory Visit
Admission: RE | Admit: 2013-04-26 | Discharge: 2013-04-26 | Disposition: A | Discharge status: Home / Self Care | Payer: Medicare Other | Source: Ambulatory Visit

## 2013-04-26 DIAGNOSIS — Z1231 Encounter for screening mammogram for malignant neoplasm of breast: Secondary | ICD-10-CM

## 2013-05-15 ENCOUNTER — Ambulatory Visit (INDEPENDENT_AMBULATORY_CARE_PROVIDER_SITE_OTHER): Payer: Medicare Other | Admitting: Cardiovascular Disease

## 2013-05-15 ENCOUNTER — Encounter: Payer: Self-pay | Admitting: Cardiovascular Disease

## 2013-05-15 VITALS — BP 112/62 | HR 70 | Resp 20 | Ht 59.0 in | Wt 149.0 lb

## 2013-05-15 DIAGNOSIS — R079 Chest pain, unspecified: Secondary | ICD-10-CM

## 2013-05-15 DIAGNOSIS — I10 Essential (primary) hypertension: Secondary | ICD-10-CM

## 2013-05-15 DIAGNOSIS — I442 Atrioventricular block, complete: Secondary | ICD-10-CM | POA: Diagnosis not present

## 2013-05-15 DIAGNOSIS — I4891 Unspecified atrial fibrillation: Secondary | ICD-10-CM | POA: Diagnosis not present

## 2013-05-15 DIAGNOSIS — Z950 Presence of cardiac pacemaker: Secondary | ICD-10-CM | POA: Diagnosis not present

## 2013-05-15 DIAGNOSIS — I480 Paroxysmal atrial fibrillation: Secondary | ICD-10-CM

## 2013-05-15 DIAGNOSIS — E785 Hyperlipidemia, unspecified: Secondary | ICD-10-CM

## 2013-05-15 LAB — PACEMAKER DEVICE OBSERVATION

## 2013-05-15 NOTE — Patient Instructions (Signed)
Your pacemaker check has been scheduled for 08-16-2013 @ 2:00pm [facility] location).  Your physician recommends that you schedule a follow-up appointment in: 6 MONTHS WITH DR. Royann Shivers

## 2013-05-17 ENCOUNTER — Encounter: Payer: Self-pay | Admitting: Cardiovascular Disease

## 2013-05-17 DIAGNOSIS — I442 Atrioventricular block, complete: Secondary | ICD-10-CM | POA: Insufficient documentation

## 2013-05-17 DIAGNOSIS — I480 Paroxysmal atrial fibrillation: Secondary | ICD-10-CM | POA: Insufficient documentation

## 2013-05-17 NOTE — Progress Notes (Signed)
Patient ID: [name], female   DOB: 1932/05/30, 79 y.o.   MRN: [id number]      Reason for office visit Pacemaker followup  [name] had a pacemaker implanted in 2009 for second degree AV block but has subsequently developed complete heart block and is pacemaker dependent. Her device is functioning normally. Unfortunately it is not amenable to remote monitoring so she comes to the office every 3 months for device checks. She also has well treated hypertension and hyperlipidemia and her pacemaker has previously recorded very brief episodes of atrial tachyarrhythmia possibly atrial fibrillation. The longest one lasted less than 2 minutes and none have been recorded in about 2 years. She feels well. She has very mild occasional ankle edema and rare "twinges" in her upper chest.   Allergies  Allergen Reactions  . Fluticasone-Salmeterol   . Sulfonamide Derivatives     Current Outpatient Prescriptions  Medication Sig Dispense Refill  . aspirin 81 MG tablet Take 81 mg by mouth daily.        . cholecalciferol (VITAMIN D) 1000 UNITS tablet Take 1,000 Units by mouth daily.        Marland Kitchen diltiazem (DILACOR XR) 180 MG 24 hr capsule Take 1 capsule (180 mg total) by mouth daily.  30 capsule  11  . DIOVAN HCT 320-25 MG per tablet Take 1 tablet by mouth daily.      Marland Kitchen esomeprazole (NEXIUM) 40 MG capsule Take 40 mg by mouth daily before breakfast.        . furosemide (LASIX) 20 MG tablet Take 20 mg by mouth. 2 times weekly       . LORazepam (ATIVAN) 0.5 MG tablet Take 0.5 mg by mouth 2 (two) times daily as needed.        . metoprolol succinate (TOPROL-XL) 50 MG 24 hr tablet Take 1 tablet (50 mg total) by mouth 2 (two) times daily.  60 tablet  6  . Multiple Vitamin (MULTIVITAMIN) tablet Take 1 tablet by mouth daily.        . simvastatin (ZOCOR) 20 MG tablet Take 10 mg by mouth at bedtime.        No current facility-administered medications for this visit.    Past Medical History  Diagnosis Date  .  Esophageal reflux   . Allergic rhinitis   . Asthmatic bronchitis   . Chronic cough   . Hypertension   . Hyperlipemia   . PAF (paroxysmal atrial fibrillation)   . GERD (gastroesophageal reflux disease)   . Hx of echocardiogram 11/05/2010    EF>55% showed a normal systolic function with impaired diastolic dysfuntion and also tissue doppler suggesting increased elevated left atrial pressure, she had moderate LA dilatation and mild LA dilatation. There was calcified mitral apparatus with moderate MR, mild to moderate TR and trace aortic insufficiency.  . History of stress test 11/20/2009    This was essentially normal,post-stress EF was hyperdynamic at 73 beats per mins, There was no scar or ischemia.    Past Surgical History  Procedure Laterality Date  . Pacemaker insertion  June 1st 2009    St Jude [place] device, model #4098, serial [id number], The artial lead is a Lexicographer, the ventricular lead is Runner, broadcasting/film/video.  Marland Kitchen Foot surgery      right  . Thyroid surgery    . Vesicovaginal fistula closure w/ tah    . Cholecystectomy    . Tonsillectomy    . Nasal septum surgery  Family History  Problem Relation Age of Onset  . Stroke Mother   . Cancer Father     bladder  . Stroke Sister     History   Social History  . Marital Status: Married    Spouse Name: N/A    Number of Children: N/A  . Years of Education: N/A   Occupational History  .      Retired Heritage manager at [employer]   Social History Main Topics  . Smoking status: Never Smoker   . Smokeless tobacco: Not on file  . Alcohol Use: No  . Drug Use: Not on file  . Sexual Activity: Not on file   Other Topics Concern  . Not on file   Social History Narrative  . No narrative on file    Review of systems: The patient specifically denies any chest pain at rest or with exertion, dyspnea at rest or with exertion, orthopnea, paroxysmal nocturnal dyspnea, syncope, palpitations, focal neurological  deficits, intermittent claudication, lower extremity edema, unexplained weight gain, cough, hemoptysis or wheezing.  The patient also denies abdominal pain, nausea, vomiting, dysphagia, diarrhea, constipation, polyuria, polydipsia, dysuria, hematuria, frequency, urgency, abnormal bleeding or bruising, fever, chills, unexpected weight changes, mood swings, change in skin or hair texture, change in voice quality, auditory or visual problems, allergic reactions or rashes, new musculoskeletal complaints other than usual "aches and pains".   PHYSICAL EXAM BP 112/62  Pulse 70  Resp 20  Ht 4\' 11"  (1.499 m)  Wt 149 lb (67.586 kg)  BMI 30.08 kg/m2  General: Alert, oriented x3, no distress Head: no evidence of trauma, PERRL, EOMI, no exophtalmos or lid lag, no myxedema, no xanthelasma; normal ears, nose and oropharynx Neck: normal jugular venous pulsations and no hepatojugular reflux; brisk carotid pulses without delay and no carotid bruits Chest: clear to auscultation, no signs of consolidation by percussion or palpation, normal fremitus, symmetrical and full respiratory excursions; healthy pacemaker site Cardiovascular: normal position and quality of the apical impulse, regular rhythm, normal first and second heart sounds, no murmurs, rubs or gallops Abdomen: no tenderness or distention, no masses by palpation, no abnormal pulsatility or arterial bruits, normal bowel sounds, no hepatosplenomegaly Extremities: no clubbing, cyanosis or edema; 2+ radial, ulnar and brachial pulses bilaterally; 2+ right femoral, posterior tibial and dorsalis pedis pulses; 2+ left femoral, posterior tibial and dorsalis pedis pulses; no subclavian or femoral bruits Neurological: grossly nonfocal   EKG: Atrial ventricular sequential pacing  Lipid Panel     Component Value Date/Time   CHOL  Value: 153        ATP III CLASSIFICATION:  <200     mg/dL   Desirable  161-096  mg/dL   Borderline High  >=045    mg/dL   High  09/27/8117 1478   TRIG 87 11/19/2007 0700   HDL 59 11/19/2007 0700   CHOLHDL 2.6 11/19/2007 0700   VLDL 17 11/19/2007 0700   LDLCALC  Value: 77        Total Cholesterol/HDL:CHD Risk Coronary Heart Disease Risk Table                     Men   Women  1/2 Average Risk   3.4   3.3 11/19/2007 0700    BMET    Component Value Date/Time   NA 139 11/28/2007 0351   K 3.8 11/28/2007 0351   CL 104 11/28/2007 0351   CO2 27 11/28/2007 0351   GLUCOSE 154* 11/28/2007 0351  BUN 16 11/28/2007 0351   CREATININE 0.96 11/28/2007 0351   CALCIUM 8.7 11/28/2007 0351   GFRNONAA 57* 11/28/2007 0351   GFRAA  Value: >60        The eGFR has been calculated using the MDRD equation. This calculation has not been validated in all clinical 11/28/2007 0351     ASSESSMENT AND PLAN Pacemaker She has a dual-chamber St. Jude device with normal function. There is 100% ventricular pacing secondary to complete heart block and she is pacemaker dependent. There is also nearly 100% atrial pacing. She has roughly 4 years of battery life expectancy at this time. No arrhythmia has been recorded by the device. Lead parameters appear excellent.  HTN (hypertension) Excellent control  Hyperlipidemia Labs performed in Dr. Berline Lopes office on October 3 reportedly in the desirable range  Orders Placed This Encounter  Procedures  . EKG 12-Lead   Patient Instructions  Your pacemaker check has been scheduled for 08-16-2013 @ 2:00pm [facility] location).  Your physician recommends that you schedule a follow-up appointment in: 6 MONTHS WITH DR. Royann Shivers     Junious Silk, MD, [facility] [phone] office [phone] pager

## 2013-05-17 NOTE — Assessment & Plan Note (Signed)
Labs performed in Dr. Altheimer's office on October 3 reportedly in the desirable range

## 2013-05-17 NOTE — Assessment & Plan Note (Signed)
Excellent control.   

## 2013-05-17 NOTE — Assessment & Plan Note (Signed)
She has a dual-chamber St. Jude device with normal function. There is 100% ventricular pacing secondary to complete heart block and she is pacemaker dependent. There is also nearly 100% atrial pacing. She has roughly 4 years of battery life expectancy at this time. No arrhythmia has been recorded by the device. Lead parameters appear excellent.

## 2013-05-21 ENCOUNTER — Encounter: Payer: Self-pay | Admitting: Cardiovascular Disease

## 2013-05-22 DIAGNOSIS — H2510 Age-related nuclear cataract, unspecified eye: Secondary | ICD-10-CM | POA: Diagnosis not present

## 2013-05-22 DIAGNOSIS — H1045 Other chronic allergic conjunctivitis: Secondary | ICD-10-CM | POA: Diagnosis not present

## 2013-05-22 DIAGNOSIS — Z961 Presence of intraocular lens: Secondary | ICD-10-CM | POA: Diagnosis not present

## 2013-05-22 DIAGNOSIS — H26499 Other secondary cataract, unspecified eye: Secondary | ICD-10-CM | POA: Diagnosis not present

## 2013-05-22 DIAGNOSIS — D3130 Benign neoplasm of unspecified choroid: Secondary | ICD-10-CM | POA: Diagnosis not present

## 2013-05-23 LAB — MDC_IDC_ENUM_SESS_TYPE_INCLINIC
Brady Statistic RA Percent Paced: 99 %
Implantable Pulse Generator Model: 5826
Implantable Pulse Generator Serial Number: 2109576
Lead Channel Impedance Value: 545 Ohm
Lead Channel Pacing Threshold Amplitude: 0.5 V
Lead Channel Pacing Threshold Amplitude: 0.5 V
Lead Channel Sensing Intrinsic Amplitude: 3.9 mV
Lead Channel Setting Pacing Amplitude: 0.75 V
Lead Channel Setting Pacing Amplitude: 2 V
Lead Channel Setting Sensing Sensitivity: 4 mV

## 2013-05-24 ENCOUNTER — Ambulatory Visit (INDEPENDENT_AMBULATORY_CARE_PROVIDER_SITE_OTHER): Payer: Medicare Other

## 2013-05-24 DIAGNOSIS — J309 Allergic rhinitis, unspecified: Secondary | ICD-10-CM | POA: Diagnosis not present

## 2013-05-31 ENCOUNTER — Ambulatory Visit: Payer: Medicare Other

## 2013-06-07 ENCOUNTER — Ambulatory Visit (INDEPENDENT_AMBULATORY_CARE_PROVIDER_SITE_OTHER): Payer: Medicare Other

## 2013-06-07 DIAGNOSIS — J309 Allergic rhinitis, unspecified: Secondary | ICD-10-CM

## 2013-06-11 ENCOUNTER — Ambulatory Visit (INDEPENDENT_AMBULATORY_CARE_PROVIDER_SITE_OTHER): Payer: Medicare Other | Admitting: Podiatry

## 2013-06-11 ENCOUNTER — Encounter: Payer: Self-pay | Admitting: Podiatry

## 2013-06-11 VITALS — BP 121/63 | HR 70 | Resp 16 | Ht <= 58 in | Wt 147.0 lb

## 2013-06-11 DIAGNOSIS — Q828 Other specified congenital malformations of skin: Secondary | ICD-10-CM | POA: Diagnosis not present

## 2013-06-11 NOTE — Progress Notes (Signed)
   Subjective:    Patient ID: [name], female    DOB: 27-Jan-1932, 79 y.o.   MRN: [id number]  "I got those old hard places on the bottom of my feet.  I'm tired of walking on them hurting."  Foot Pain This is a recurrent (Calluses Lateral B/L) problem. The current episode started more than 1 year ago. The problem occurs daily. The problem has been gradually worsening. The symptoms are aggravated by walking. Treatments tried: usually come here and have them trimmed once a year.  The treatment provided significant relief.   Patient presents for ongoing debridement of painful hyperkeratotic lesions she's been a patient in this practice as 1993. The last visit for debridement of these painful lesions was 05/11/2011.   Review of Systems     Objective:   Physical Exam Orientated x80 79 year old white female  Objective: Multiple nucleated, punctate keratoses on the plantar lateral aspect of the right and left feet approximately 8 lesions.       Assessment & Plan:   Assessment: Porokeratoses multiple lesions  Plan: Keratoses are debrided and packed with salinocaine. Reappoint at patient's request.

## 2013-07-03 DIAGNOSIS — R5381 Other malaise: Secondary | ICD-10-CM | POA: Diagnosis not present

## 2013-07-03 DIAGNOSIS — M899 Disorder of bone, unspecified: Secondary | ICD-10-CM | POA: Diagnosis not present

## 2013-07-03 DIAGNOSIS — M949 Disorder of cartilage, unspecified: Secondary | ICD-10-CM | POA: Diagnosis not present

## 2013-07-03 DIAGNOSIS — I10 Essential (primary) hypertension: Secondary | ICD-10-CM | POA: Diagnosis not present

## 2013-07-03 DIAGNOSIS — E785 Hyperlipidemia, unspecified: Secondary | ICD-10-CM | POA: Diagnosis not present

## 2013-07-03 DIAGNOSIS — E039 Hypothyroidism, unspecified: Secondary | ICD-10-CM | POA: Diagnosis not present

## 2013-07-03 DIAGNOSIS — R7301 Impaired fasting glucose: Secondary | ICD-10-CM | POA: Diagnosis not present

## 2013-07-03 DIAGNOSIS — E559 Vitamin D deficiency, unspecified: Secondary | ICD-10-CM | POA: Diagnosis not present

## 2013-07-05 ENCOUNTER — Ambulatory Visit (INDEPENDENT_AMBULATORY_CARE_PROVIDER_SITE_OTHER): Payer: Medicare Other

## 2013-07-05 DIAGNOSIS — J309 Allergic rhinitis, unspecified: Secondary | ICD-10-CM | POA: Diagnosis not present

## 2013-07-12 ENCOUNTER — Ambulatory Visit (INDEPENDENT_AMBULATORY_CARE_PROVIDER_SITE_OTHER): Payer: Medicare Other

## 2013-07-12 DIAGNOSIS — J309 Allergic rhinitis, unspecified: Secondary | ICD-10-CM | POA: Diagnosis not present

## 2013-07-19 ENCOUNTER — Ambulatory Visit: Payer: Medicare Other

## 2013-07-26 ENCOUNTER — Ambulatory Visit (INDEPENDENT_AMBULATORY_CARE_PROVIDER_SITE_OTHER): Payer: Medicare Other

## 2013-07-26 DIAGNOSIS — J309 Allergic rhinitis, unspecified: Secondary | ICD-10-CM

## 2013-08-01 ENCOUNTER — Ambulatory Visit (INDEPENDENT_AMBULATORY_CARE_PROVIDER_SITE_OTHER): Payer: Medicare Other | Admitting: Podiatry

## 2013-08-01 ENCOUNTER — Encounter: Payer: Self-pay | Admitting: Podiatry

## 2013-08-01 VITALS — BP 121/60 | HR 70 | Resp 12

## 2013-08-01 DIAGNOSIS — L600 Ingrowing nail: Secondary | ICD-10-CM

## 2013-08-01 NOTE — Progress Notes (Signed)
   Subjective:    Patient ID: [name], female    DOB: 02/06/1932, 80 y.o.   MRN: [id number]  HPI  '' LT FOOT GREAT TOENAIL IS BEEN SORE FOR 3-4 WEEKS, BUT ITS BEEN BETTER. TREATMENT TRIED ANTIBIOTIC CREAM AS NEEDED. This patient is complaining of pain along the medial border left hallux nail.   Review of Systems     Objective:   Physical Exam  Orientated x3 white female  Vascular: DP and PTs are 2/4 bilaterally  Dermatological: Mild incurvation of the medial border left hallux toenail with slight tenderness. There is no erythema, edema, warmth or drainage noted.  Neurological: Sensation intact bilaterally        Assessment & Plan:  Assessment: Incurvation of the medial margin left hallux toenail/ingrowing without any clinical sign of infection There is some suggestion my patient that she is taking an unknown anticoagulant medication other than aspirin.  Plan: The medial margin left hallux nail was debrided without any bleeding. There is no drainage noted. If the symptoms persist would consider offering patient phenol matricectomy with clarification of possible anticoagulant medication. Reappoint at patient's request.

## 2013-08-02 ENCOUNTER — Encounter: Payer: Self-pay | Admitting: Podiatry

## 2013-08-10 ENCOUNTER — Other Ambulatory Visit: Payer: Self-pay | Admitting: Cardiovascular Disease

## 2013-08-14 ENCOUNTER — Other Ambulatory Visit: Payer: Self-pay | Admitting: Cardiovascular Disease

## 2013-08-14 NOTE — Telephone Encounter (Signed)
Rx was sent to pharmacy electronically. 

## 2013-08-29 ENCOUNTER — Ambulatory Visit (INDEPENDENT_AMBULATORY_CARE_PROVIDER_SITE_OTHER): Payer: Medicare Other | Admitting: *Deleted

## 2013-08-29 DIAGNOSIS — I4891 Unspecified atrial fibrillation: Secondary | ICD-10-CM | POA: Diagnosis not present

## 2013-08-29 DIAGNOSIS — I480 Paroxysmal atrial fibrillation: Secondary | ICD-10-CM

## 2013-08-29 DIAGNOSIS — I442 Atrioventricular block, complete: Secondary | ICD-10-CM

## 2013-08-29 LAB — MDC_IDC_ENUM_SESS_TYPE_INCLINIC
Battery Impedance: 2000 Ohm
Battery Voltage: 2.8 V
Brady Statistic RA Percent Paced: 99 %
Implantable Pulse Generator Serial Number: 2109576
Lead Channel Impedance Value: 573 Ohm
Lead Channel Pacing Threshold Amplitude: 0.5 V
Lead Channel Pacing Threshold Pulse Width: 0.5 ms
Lead Channel Setting Pacing Pulse Width: 0.5 ms
MDC IDC MSMT LEADCHNL RA SENSING INTR AMPL: 2.5 mV
MDC IDC MSMT LEADCHNL RV IMPEDANCE VALUE: 508 Ohm
MDC IDC MSMT LEADCHNL RV PACING THRESHOLD AMPLITUDE: 0.5 V
MDC IDC MSMT LEADCHNL RV PACING THRESHOLD PULSEWIDTH: 0.5 ms
MDC IDC SESS DTM: 20150311165148
MDC IDC SET LEADCHNL RA PACING AMPLITUDE: 2 V
MDC IDC SET LEADCHNL RV SENSING SENSITIVITY: 4 mV
MDC IDC STAT BRADY RV PERCENT PACED: 99 % — AB

## 2013-08-29 LAB — PACEMAKER DEVICE OBSERVATION

## 2013-08-29 NOTE — Progress Notes (Signed)
Pacemaker check in clinic. Normal device function. Thresholds, sensing, impedances consistent with previous measurements. Device programmed to maximize longevity. 1 mode switch (<1%) x 6 sec @ A rate of 167 bpm. No high ventricular rates noted. Device programmed at appropriate safety margins. Histogram distribution appropriate for patient activity level. Device programmed to optimize intrinsic conduction. Estimated longevity 3.75-4.5 years. Patient will follow up with [facility] in June 2015.

## 2013-08-30 ENCOUNTER — Ambulatory Visit (INDEPENDENT_AMBULATORY_CARE_PROVIDER_SITE_OTHER): Payer: Medicare Other

## 2013-08-30 DIAGNOSIS — J309 Allergic rhinitis, unspecified: Secondary | ICD-10-CM | POA: Diagnosis not present

## 2013-09-03 ENCOUNTER — Ambulatory Visit: Payer: Medicare Other | Admitting: Internal Medicine

## 2013-09-03 ENCOUNTER — Encounter: Payer: Self-pay | Admitting: Internal Medicine

## 2013-09-03 ENCOUNTER — Ambulatory Visit (INDEPENDENT_AMBULATORY_CARE_PROVIDER_SITE_OTHER): Payer: Medicare Other | Admitting: Internal Medicine

## 2013-09-03 VITALS — BP 122/70 | HR 69 | Ht 59.0 in | Wt 150.2 lb

## 2013-09-03 DIAGNOSIS — J45909 Unspecified asthma, uncomplicated: Secondary | ICD-10-CM

## 2013-09-03 DIAGNOSIS — J301 Allergic rhinitis due to pollen: Secondary | ICD-10-CM

## 2013-09-03 DIAGNOSIS — K219 Gastro-esophageal reflux disease without esophagitis: Secondary | ICD-10-CM

## 2013-09-03 NOTE — Progress Notes (Signed)
Patient ID: [name], female    DOB: 1932/03/15, 80 y.o.   MRN: [id number]  HPI 02/23/11- 53 yoF never smoker followed for asthma, allergic rhinitis, cough, complicated by GERD Last here August 21, 2010 Holding allergy vaccine at 1:500 to avoid local reactions. Her cough persists with throat tickle. Takes a lot of cough drops. Admits ongoing occasional reflux, which we discussed again. In September we discussed her GI visit with Dr Penelope Coop who had done endoscopy and recommended she take 2 Nexium- insurance won't pay. She has 3 cats- all her life. HOB is elevated.  03/09/2011 -79 yoF never smoker followed for asthma, allergic rhinitis, cough, complicated by GERD Acute visit. For 2 weeks she has had a sore throat with increased postnasal drainage worse cough watery nose. She cancelled her methacholine test because of the cough. Nose and ears are itching. We had doubled her GERD medication by adding omeprazole plus Nexium. She does admit some reflux but not heartburn. Allergy profile: 02/23/2011 IgE 14.1 with elevations for ragweed and goldenrod  04/27/11- 79 yoF never smoker followed for asthma, allergic rhinitis, cough, complicated by GERD Has had flu vaccine. Coughing is a little better. She thinks it might be worse on the days she gets her allergy shots and her son has pointed that out. She does not cough at night. Coughs in the evenings, not related to meals. She doesn't often feel herself refluxing while she takes omeprazole and sleeps with the head of her bed elevated. We again discussed the idea of "cyclic cough". She was sick today she was scheduled to have a methacholine inhalation challenge test. We will reschedule if it seems useful.  10/26/11- 79 yoF never smoker followed for asthma, allergic rhinitis, cough, complicated by GERD After concern that allergy shts were associated w/ incvreased cough, she decided she was better off on them and restarted. Now at 1:500. Some throat clearing and  postnasal drip. No overt reflux. HOB is raised. Failed benzonatate.  01/03/12 Acute OV  pt c/o dry cough x 2-3 years with little to no production. pt states she has had some wheezing at night time and some during the day time. Pt also c/o some chest congestion that comes and goes as well as PND.  pt states she completed a course of Pred with no relief. Called in steroid taper not much help  +nasal drip  No fever or discolored mucus  Never smoker. cxr  02/2011 no acute process.   02/11/12- 80 yoF never smoker followed for asthma, allergic rhinitis, cough, complicated by GERD Has been using non- menthol cough drops to help with cough. Would say cough better since last visit.  Tramadol has been some help. Fewer bad coughing spells, noted mostly after meals. We discussed reflux and cough during swallowing. Uses omeprazole and Nexium. Does occasionally feel reflux events. Occasional mild wheeze but no chest pain or shortness of breath. She feels allergy vaccine does well, currently rebuilding.  05/04/12- 80 yoF never smoker followed for asthma, allergic rhinitis, cough, complicated by GERD FOLLOWS FOR: still having slight cough-productive-slight yellow in color; recently sick. Wheezing as well. Flare of hard persistent dry coughing started while on the telephone a few days ago. She didn't recognize it as a reflux event. Cough is now better but not quite back to baseline. She continues allergy vaccine 1:50 [place]  09/01/12 80 yoF never smoker followed for asthma, allergic rhinitis, cough, complicated by GERD FOLLOWS FOR: still on vaccine; sneezing, runny nose x 4 days.  Son had a cold and now she has caught it. Sick x5 days with nasal itching, sneezing. Some chills yesterday but no sore throat or fever. Continues allergy vaccine 1:50 GH without problems. CXR 05/05/12- IMPRESSION:  No active cardiopulmonary disease. Chronic change.  Original Report Authenticated By: [name], M.D.  03/06/13- 64 yoF  never smoker followed for asthma, allergic rhinitis, cough, complicated by GERD FOLLOWS AYT:KZSWF well,still gets Allergy vaccine 1:50 GH,had today,tickle in throat,denies cough,sob,wheeze Gets her flu shot from her primary physician. We talked about what to do with allergy vaccine. She gets injections from 2 divided vials and the ragweed side usually itches some.  09/03/13- 81 yoF never smoker followed for asthma, allergic rhinitis, cough, complicated by GERD FOLLOWS FOR: Still on Allergy  Vaccine (A-1:50, B-1:50, GH) and denies any flare ups at this time. Only occasional minor itch local reaction to allergy shots. Separate injections for each vial.  Occ minor wheeze. Denies need for rescue inhaler. Reflux stable, controlled.   ROS-see HPI Constitutional:   No-   weight loss, night sweats, fevers, chills, fatigue, lassitude. HEENT:   No-  headaches, difficulty swallowing, tooth/dental problems, sore throat,       No-  sneezing, itching, ear ache, nasal congestion, post nasal drip,  CV:  No-   chest pain, orthopnea, PND, swelling in lower extremities, anasarca,  dizziness, palpitations Resp: No-   shortness of breath with exertion or at rest.              No-   productive cough,  No non-productive cough,  No- coughing up of blood.              No-   change in color of mucus.  No- wheezing.   Skin: No-   rash or lesions. GI:  No-   heartburn, indigestion, abdominal pain, nausea, vomiting, GU:  MS:  No-   joint pain or swelling.   Neuro-     nothing unusual Psych:  No- change in mood or affect. No depression or anxiety.  No memory loss.  OBJ- Physical Exam General- Alert, Oriented, Affect-appropriate, Distress- none acute Skin- rash-none, lesions- none, excoriation- none Lymphadenopathy- none Head- atraumatic            Eyes- Gross vision intact, PERRLA, conjunctivae and secretions clear            Ears- Hearing, canals-normal            Nose- Clear, no-Septal dev, mucus, polyps,  erosion, perforation             Throat- Mallampati II , mucosa clear , drainage- none, tonsils- atrophic. + Throat clearing. Neck- flexible , trachea midline, no stridor , thyroid nl, carotid no bruit Chest - symmetrical excursion , unlabored           Heart/CV- RRR , no murmur , no gallop  , no rub, nl s1 s2                           - JVD- none , edema- none, stasis changes- none, varices- none           Lung- clear to P&A, wheeze- none, no- cough , dullness-none, rub- none           Chest wall-  Abd-  Br/ Gen/ Rectal- Not done, not indicated Extrem- cyanosis- none, clubbing, none, atrophy- none, strength- nl Neuro- grossly intact to observation

## 2013-09-03 NOTE — Patient Instructions (Signed)
We can continue allergy vaccine GH  Continue to take your GERD medicine and avoid lying down for an hour or two after you eat or drink, to minimize reflux  Ok to use an otc antihistamine if needed.

## 2013-09-11 ENCOUNTER — Other Ambulatory Visit: Payer: Self-pay | Admitting: Cardiovascular Disease

## 2013-09-12 ENCOUNTER — Other Ambulatory Visit: Payer: Self-pay

## 2013-09-12 MED ORDER — VALSARTAN-HYDROCHLOROTHIAZIDE 320-25 MG PO TABS: 1.0000 | ORAL_TABLET | Freq: Every day | ORAL | Status: DC

## 2013-09-12 NOTE — Telephone Encounter (Signed)
Patient requests 90-day supply. Rx was sent to pharmacy electronically.

## 2013-09-13 ENCOUNTER — Ambulatory Visit (INDEPENDENT_AMBULATORY_CARE_PROVIDER_SITE_OTHER): Payer: Medicare Other

## 2013-09-13 DIAGNOSIS — J309 Allergic rhinitis, unspecified: Secondary | ICD-10-CM

## 2013-09-14 ENCOUNTER — Encounter: Payer: Self-pay | Admitting: Internal Medicine

## 2013-09-18 NOTE — Assessment & Plan Note (Signed)
Mild intermittent asthma. She denies need for rescue inhaler.

## 2013-09-18 NOTE — Assessment & Plan Note (Signed)
Some awareness of GERD, mostly controlled.

## 2013-09-18 NOTE — Assessment & Plan Note (Signed)
Satisfactory control. She is comfortable continuing allergy vaccine.

## 2013-09-20 ENCOUNTER — Ambulatory Visit: Payer: Medicare Other

## 2013-09-27 ENCOUNTER — Ambulatory Visit (INDEPENDENT_AMBULATORY_CARE_PROVIDER_SITE_OTHER): Payer: Medicare Other

## 2013-09-27 DIAGNOSIS — J309 Allergic rhinitis, unspecified: Secondary | ICD-10-CM

## 2013-10-03 DIAGNOSIS — R7301 Impaired fasting glucose: Secondary | ICD-10-CM | POA: Diagnosis not present

## 2013-10-03 DIAGNOSIS — I10 Essential (primary) hypertension: Secondary | ICD-10-CM | POA: Diagnosis not present

## 2013-10-03 DIAGNOSIS — E785 Hyperlipidemia, unspecified: Secondary | ICD-10-CM | POA: Diagnosis not present

## 2013-10-03 DIAGNOSIS — E559 Vitamin D deficiency, unspecified: Secondary | ICD-10-CM | POA: Diagnosis not present

## 2013-10-04 ENCOUNTER — Ambulatory Visit: Payer: Medicare Other

## 2013-10-08 DIAGNOSIS — E039 Hypothyroidism, unspecified: Secondary | ICD-10-CM | POA: Diagnosis not present

## 2013-10-08 DIAGNOSIS — M949 Disorder of cartilage, unspecified: Secondary | ICD-10-CM | POA: Diagnosis not present

## 2013-10-08 DIAGNOSIS — M899 Disorder of bone, unspecified: Secondary | ICD-10-CM | POA: Diagnosis not present

## 2013-10-08 DIAGNOSIS — I10 Essential (primary) hypertension: Secondary | ICD-10-CM | POA: Diagnosis not present

## 2013-10-08 DIAGNOSIS — R5381 Other malaise: Secondary | ICD-10-CM | POA: Diagnosis not present

## 2013-10-08 DIAGNOSIS — E559 Vitamin D deficiency, unspecified: Secondary | ICD-10-CM | POA: Diagnosis not present

## 2013-10-08 DIAGNOSIS — R5383 Other fatigue: Secondary | ICD-10-CM | POA: Diagnosis not present

## 2013-10-08 DIAGNOSIS — E785 Hyperlipidemia, unspecified: Secondary | ICD-10-CM | POA: Diagnosis not present

## 2013-10-08 DIAGNOSIS — R7301 Impaired fasting glucose: Secondary | ICD-10-CM | POA: Diagnosis not present

## 2013-10-22 ENCOUNTER — Telehealth: Payer: Self-pay | Admitting: Internal Medicine

## 2013-10-22 MED ORDER — AMOXICILLIN 250 MG PO CAPS: 250.0000 mg | ORAL_CAPSULE | Freq: Three times a day (TID) | ORAL | Status: DC

## 2013-10-22 MED ORDER — AMOXICILLIN 875 MG PO TABS: 875.0000 mg | ORAL_TABLET | Freq: Two times a day (BID) | ORAL | Status: DC

## 2013-10-22 NOTE — Telephone Encounter (Signed)
Pt states that she is not able to take large pills and is requesting amoxil be called in for capsule form.  Spoke with pharmacist and he states the strongest Amoxil comes in capsule form is 500mg .  Please advise.  Current Outpatient Prescriptions on File Prior to Visit  Medication Sig Dispense Refill  . amoxicillin (AMOXIL) 875 MG tablet Take 1 tablet (875 mg total) by mouth 2 (two) times daily.  14 tablet  0  . aspirin 81 MG tablet Take 81 mg by mouth daily.        . cholecalciferol (VITAMIN D) 1000 UNITS tablet Take 1,000 Units by mouth daily.        Marland Kitchen diltiazem (DILACOR XR) 180 MG 24 hr capsule Take 1 capsule (180 mg total) by mouth daily.  30 capsule  11  . esomeprazole (NEXIUM) 40 MG capsule Take 40 mg by mouth daily before breakfast.        . furosemide (LASIX) 20 MG tablet Take one tablet by mouth as needed      . LORazepam (ATIVAN) 0.5 MG tablet Take 0.5 mg by mouth 2 (two) times daily as needed.        . metoprolol succinate (TOPROL-XL) 50 MG 24 hr tablet TAKE ONE TABLET BY MOUTH TWICE DAILY   60 tablet  9  . Multiple Vitamin (MULTIVITAMIN) tablet Take 1 tablet by mouth daily.        . simvastatin (ZOCOR) 20 MG tablet Take 10 mg by mouth at bedtime.       . valsartan-hydrochlorothiazide (DIOVAN-HCT) 320-25 MG per tablet Take 1 tablet by mouth daily.  90 tablet  2   No current facility-administered medications on file prior to visit.

## 2013-10-22 NOTE — Telephone Encounter (Signed)
Spoke with pt and advised of Dr Janee Morn recommendations.

## 2013-10-22 NOTE — Telephone Encounter (Signed)
Probably a more appropriate order for her would be amoxacillin 250 mg # 21, 1 three times daily,

## 2013-10-22 NOTE — Telephone Encounter (Signed)
LMOM that rx for capsule form of Amoxicillin was sent to pharmacy.

## 2013-10-22 NOTE — Telephone Encounter (Signed)
Offer amoxacillin ("amox") 875 mg, 3 13, one twice daily. Can also take Mucinex-DM

## 2013-10-22 NOTE — Telephone Encounter (Signed)
C[place]azon x 1.  Let pt know that rx was sent to [facility] pharmacy on [address] and pt also needs to take Mucinex DM

## 2013-10-22 NOTE — Telephone Encounter (Signed)
Pt c/o sorethroat, prod cough (? Color), chest congestion, some wheezing x 5 days.  Denies fever or SOB.  Please advise.  Allergies  Allergen Reactions  . Erythromycin Nausea And Vomiting and Other (See Comments)    Stomach cramps  . Fluticasone-Salmeterol   . Sulfonamide Derivatives     Current Outpatient Prescriptions on File Prior to Visit  Medication Sig Dispense Refill  . aspirin 81 MG tablet Take 81 mg by mouth daily.        . cholecalciferol (VITAMIN D) 1000 UNITS tablet Take 1,000 Units by mouth daily.        Marland Kitchen diltiazem (DILACOR XR) 180 MG 24 hr capsule Take 1 capsule (180 mg total) by mouth daily.  30 capsule  11  . esomeprazole (NEXIUM) 40 MG capsule Take 40 mg by mouth daily before breakfast.        . furosemide (LASIX) 20 MG tablet Take one tablet by mouth as needed      . LORazepam (ATIVAN) 0.5 MG tablet Take 0.5 mg by mouth 2 (two) times daily as needed.        . metoprolol succinate (TOPROL-XL) 50 MG 24 hr tablet TAKE ONE TABLET BY MOUTH TWICE DAILY   60 tablet  9  . Multiple Vitamin (MULTIVITAMIN) tablet Take 1 tablet by mouth daily.        . simvastatin (ZOCOR) 20 MG tablet Take 10 mg by mouth at bedtime.       . valsartan-hydrochlorothiazide (DIOVAN-HCT) 320-25 MG per tablet Take 1 tablet by mouth daily.  90 tablet  2   No current facility-administered medications on file prior to visit.

## 2013-11-01 ENCOUNTER — Ambulatory Visit (INDEPENDENT_AMBULATORY_CARE_PROVIDER_SITE_OTHER): Payer: Medicare Other

## 2013-11-01 DIAGNOSIS — J309 Allergic rhinitis, unspecified: Secondary | ICD-10-CM

## 2013-11-07 ENCOUNTER — Telehealth: Payer: Self-pay | Admitting: Cardiovascular Disease

## 2013-11-08 ENCOUNTER — Ambulatory Visit: Payer: Medicare Other

## 2013-11-08 NOTE — Telephone Encounter (Signed)
Closed encounter °

## 2013-11-14 ENCOUNTER — Encounter: Payer: Self-pay | Admitting: Cardiovascular Disease

## 2013-11-19 ENCOUNTER — Other Ambulatory Visit: Payer: Self-pay | Admitting: *Deleted

## 2013-11-19 MED ORDER — DILTIAZEM HCL ER 180 MG PO CP24: 180.0000 mg | ORAL_CAPSULE | Freq: Every day | ORAL | Status: DC

## 2013-11-19 NOTE — Telephone Encounter (Signed)
Rx was sent to pharmacy electronically. 

## 2013-11-21 ENCOUNTER — Telehealth: Payer: Self-pay | Admitting: Cardiovascular Disease

## 2013-11-22 ENCOUNTER — Ambulatory Visit (INDEPENDENT_AMBULATORY_CARE_PROVIDER_SITE_OTHER): Payer: Medicare Other

## 2013-11-22 DIAGNOSIS — J309 Allergic rhinitis, unspecified: Secondary | ICD-10-CM

## 2013-11-23 NOTE — Telephone Encounter (Signed)
Closed encounter °

## 2013-11-26 DIAGNOSIS — H353190 Nonexudative age-related macular degeneration, unspecified eye, stage unspecified: Secondary | ICD-10-CM | POA: Diagnosis not present

## 2013-11-26 DIAGNOSIS — H2510 Age-related nuclear cataract, unspecified eye: Secondary | ICD-10-CM | POA: Diagnosis not present

## 2013-11-26 DIAGNOSIS — H43399 Other vitreous opacities, unspecified eye: Secondary | ICD-10-CM | POA: Diagnosis not present

## 2013-11-26 DIAGNOSIS — D3130 Benign neoplasm of unspecified choroid: Secondary | ICD-10-CM | POA: Diagnosis not present

## 2013-11-27 ENCOUNTER — Ambulatory Visit (INDEPENDENT_AMBULATORY_CARE_PROVIDER_SITE_OTHER): Payer: Medicare Other | Admitting: Cardiovascular Disease

## 2013-11-27 ENCOUNTER — Encounter: Payer: Self-pay | Admitting: Cardiovascular Disease

## 2013-11-27 VITALS — BP 110/64 | HR 70 | Resp 16 | Ht 59.0 in | Wt 146.9 lb

## 2013-11-27 DIAGNOSIS — I442 Atrioventricular block, complete: Secondary | ICD-10-CM | POA: Diagnosis not present

## 2013-11-27 DIAGNOSIS — R5381 Other malaise: Secondary | ICD-10-CM

## 2013-11-27 DIAGNOSIS — I4891 Unspecified atrial fibrillation: Secondary | ICD-10-CM

## 2013-11-27 DIAGNOSIS — Z950 Presence of cardiac pacemaker: Secondary | ICD-10-CM

## 2013-11-27 DIAGNOSIS — R5383 Other fatigue: Principal | ICD-10-CM

## 2013-11-27 DIAGNOSIS — I480 Paroxysmal atrial fibrillation: Secondary | ICD-10-CM

## 2013-11-27 LAB — MDC_IDC_ENUM_SESS_TYPE_INCLINIC
Battery Impedance: 2200 Ohm
Battery Voltage: 2.79 V
Implantable Pulse Generator Model: 5826
Lead Channel Pacing Threshold Amplitude: 0.5 V
Lead Channel Pacing Threshold Amplitude: 0.5 V
Lead Channel Pacing Threshold Pulse Width: 0.5 ms
Lead Channel Pacing Threshold Pulse Width: 0.5 ms
Lead Channel Setting Pacing Pulse Width: 0.5 ms
MDC IDC MSMT LEADCHNL RA IMPEDANCE VALUE: 551 Ohm
MDC IDC MSMT LEADCHNL RA SENSING INTR AMPL: 2.9 mV
MDC IDC MSMT LEADCHNL RV IMPEDANCE VALUE: 524 Ohm
MDC IDC PG SERIAL: 2109576
MDC IDC SESS DTM: 20150609132815
MDC IDC SET LEADCHNL RA PACING AMPLITUDE: 2 V
MDC IDC SET LEADCHNL RV SENSING SENSITIVITY: 4 mV
MDC IDC STAT BRADY RA PERCENT PACED: 99 %
MDC IDC STAT BRADY RV PERCENT PACED: 99 % — AB

## 2013-11-27 LAB — PACEMAKER DEVICE OBSERVATION

## 2013-11-27 MED ORDER — VALSARTAN-HYDROCHLOROTHIAZIDE 160-12.5 MG PO TABS: 1.0000 | ORAL_TABLET | Freq: Every day | ORAL | Status: DC

## 2013-11-27 NOTE — Patient Instructions (Addendum)
A new prescription has been sent to your pharmacy for the lower dose of Valsartan HCTZ 160/12.5mg .  Dr. Sallyanne Kuster recommends that you schedule a follow-up appointment in: 6 months.

## 2013-11-27 NOTE — Progress Notes (Signed)
Patient ID: [name], female   DOB: May 08, 1932, 80 y.o.   MRN: [id number]      Reason for office visit Complete heart block, pacemaker followup  Piera has had an uneventful 6 months. Her blood pressure was low and she was feeling lightheaded. Her primary care physician Dr. Elyse Hsu decreased her valsartan/hydrochlorothiazide prescription to half the previous dose.  [name] had a pacemaker implanted in 2009 for second degree AV block but has subsequently developed complete heart block and is pacemaker dependent. Her device is functioning normally. Unfortunately it is not amenable to remote monitoring so she comes to the office  for device checks. She also has well treated hypertension and hyperlipidemia and her pacemaker has previously recorded very brief episodes of atrial tachyarrhythmia, possibly atrial fibrillation. The longest one lasted less than 2 minutes and none have been recorded in over 2 years.  Pacemaker interrogation shows essentially 100% pacing in both the atrium and ventricle. There has been no atrial fibrillation or other episodes of  mode switch. Generator longevity is estimated at about 3.5-4 years.  Allergies  Allergen Reactions  . Erythromycin Nausea And Vomiting and Other (See Comments)    Stomach cramps  . Fluticasone-Salmeterol   . Sulfonamide Derivatives     Current Outpatient Prescriptions  Medication Sig Dispense Refill  . aspirin 81 MG tablet Take 81 mg by mouth daily.        . cholecalciferol (VITAMIN D) 1000 UNITS tablet Take 1,000 Units by mouth daily.        Marland Kitchen diltiazem (DILACOR XR) 180 MG 24 hr capsule Take 1 capsule (180 mg total) by mouth daily.  30 capsule  4  . esomeprazole (NEXIUM) 40 MG capsule Take 40 mg by mouth daily before breakfast.        . furosemide (LASIX) 20 MG tablet Take one tablet by mouth as needed      . LORazepam (ATIVAN) 0.5 MG tablet Take 0.5 mg by mouth 2 (two) times daily as needed.        . metoprolol succinate  (TOPROL-XL) 50 MG 24 hr tablet TAKE ONE TABLET BY MOUTH TWICE DAILY   60 tablet  9  . Multiple Vitamin (MULTIVITAMIN) tablet Take 1 tablet by mouth daily.        . simvastatin (ZOCOR) 20 MG tablet Take 10 mg by mouth at bedtime.       . valsartan-hydrochlorothiazide (DIOVAN-HCT) 160-12.5 MG per tablet Take 1 tablet by mouth daily.  90 tablet  3   No current facility-administered medications for this visit.    Past Medical History  Diagnosis Date  . Esophageal reflux   . Allergic rhinitis   . Asthmatic bronchitis   . Chronic cough   . Hypertension   . Hyperlipemia   . PAF (paroxysmal atrial fibrillation)   . GERD (gastroesophageal reflux disease)   . Hx of echocardiogram 11/05/2010    EF>55% showed a normal systolic function with impaired diastolic dysfuntion and also tissue doppler suggesting increased elevated left atrial pressure, she had moderate LA dilatation and mild LA dilatation. There was calcified mitral apparatus with moderate MR, mild to moderate TR and trace aortic insufficiency.  . History of stress test 11/20/2009    This was essentially normal,post-stress EF was hyperdynamic at 73 beats per mins, There was no scar or ischemia.    Past Surgical History  Procedure Laterality Date  . Pacemaker insertion  June 1st 2009    St Jude [place] device, model #5826, serial [id number],  The artial lead is a Interior and spatial designer, the ventricular lead is Environmental education officer.  Marland Kitchen Foot surgery      right  . Thyroid surgery    . Vesicovaginal fistula closure w/ tah    . Cholecystectomy    . Tonsillectomy    . Nasal septum surgery      Family History  Problem Relation Age of Onset  . Stroke Mother   . Cancer Father     bladder  . Stroke Sister     History   Social History  . Marital Status: Married    Spouse Name: N/A    Number of Children: N/A  . Years of Education: N/A   Occupational History  .      Retired Sales promotion account executive at [employer]   Social History Main Topics  .  Smoking status: Never Smoker   . Smokeless tobacco: Never Used  . Alcohol Use: No  . Drug Use: No  . Sexual Activity: Not on file   Other Topics Concern  . Not on file   Social History Narrative  . No narrative on file    Review of systems: The patient specifically denies any chest pain at rest or with exertion, dyspnea at rest or with exertion, orthopnea, paroxysmal nocturnal dyspnea, syncope, palpitations, focal neurological deficits, intermittent claudication, lower extremity edema, unexplained weight gain, cough, hemoptysis or wheezing.  The patient also denies abdominal pain, nausea, vomiting, dysphagia, diarrhea, constipation, polyuria, polydipsia, dysuria, hematuria, frequency, urgency, abnormal bleeding or bruising, fever, chills, unexpected weight changes, mood swings, change in skin or hair texture, change in voice quality, auditory or visual problems, allergic reactions or rashes, new musculoskeletal complaints other than usual "aches and pains".   PHYSICAL EXAM BP 110/64  Pulse 70  Resp 16  Ht _0  (1.499 m)  Wt 146 lb 14.4 oz (66.633 kg)  BMI 29.65 kg/m2 General: Alert, oriented x3, no distress  Head: no evidence of trauma, PERRL, EOMI, no exophtalmos or lid lag, no myxedema, no xanthelasma; normal ears, nose and oropharynx  Neck: normal jugular venous pulsations and no hepatojugular reflux; brisk carotid pulses without delay and no carotid bruits  Chest: clear to auscultation, no signs of consolidation by percussion or palpation, normal fremitus, symmetrical and full respiratory excursions; healthy pacemaker site  Cardiovascular: normal position and quality of the apical impulse, regular rhythm, normal first and second heart sounds, no murmurs, rubs or gallops  Abdomen: no tenderness or distention, no masses by palpation, no abnormal pulsatility or arterial bruits, normal bowel sounds, no hepatosplenomegaly  Extremities: no clubbing, cyanosis or edema; 2+ radial, ulnar  and brachial pulses bilaterally; 2+ right femoral, posterior tibial and dorsalis pedis pulses; 2+ left femoral, posterior tibial and dorsalis pedis pulses; no subclavian or femoral bruits  Neurological: grossly nonfocal   EKG: Atrial ventricular sequential pacing   Lipid Panel     Component Value Date/Time   CHOL  Value: 153        ATP III CLASSIFICATION:  <200     mg/dL   Desirable  200-239  mg/dL   Borderline High  >=240    mg/dL   High 11/19/2007 0700   TRIG 87 11/19/2007 0700   HDL 59 11/19/2007 0700   CHOLHDL 2.6 11/19/2007 0700   VLDL 17 11/19/2007 0700   LDLCALC  Value: 77        Total Cholesterol/HDL:CHD Risk Coronary Heart Disease Risk Table  Men   Women  1/2 Average Risk   3.4   3.3 11/19/2007 0700    BMET    Component Value Date/Time   NA 139 11/28/2007 0351   K 3.8 11/28/2007 0351   CL 104 11/28/2007 0351   CO2 27 11/28/2007 0351   GLUCOSE 154* 11/28/2007 0351   BUN 16 11/28/2007 0351   CREATININE 0.96 11/28/2007 0351   CALCIUM 8.7 11/28/2007 0351   GFRNONAA 57* 11/28/2007 0351   GFRAA  Value: >60        The eGFR has been calculated using the MDRD equation. This calculation has not been validated in all clinical 11/28/2007 0351     ASSESSMENT AND PLAN  Complete heart block She has a dual-chamber St. Jude device with normal function. There is 100% ventricular pacing secondary to complete heart block and she is pacemaker dependent. There is also nearly 100% atrial pacing. She has roughly 3 to 4 years of battery life expectancy at this time. No arrhythmia has been recorded by the device. Lead parameters appear excellent. Recheck her device in 6 months in the office.  Orders Placed This Encounter  Procedures  . Implantable device check  . EKG 12-Lead   Meds ordered this encounter  Medications  . DISCONTD: valsartan-hydrochlorothiazide (DIOVAN-HCT) 320-25 MG per tablet    Sig: Take 0.5 tablets by mouth daily.  . valsartan-hydrochlorothiazide (DIOVAN-HCT) 160-12.5 MG per  tablet    Sig: Take 1 tablet by mouth daily.    Dispense:  90 tablet    Refill:  3    Oiva Dibari  Sanda Klein, MD, [facility] [phone] office [phone] pager

## 2013-12-05 DIAGNOSIS — M129 Arthropathy, unspecified: Secondary | ICD-10-CM | POA: Diagnosis not present

## 2013-12-13 ENCOUNTER — Ambulatory Visit: Payer: Medicare Other | Admitting: Cardiovascular Disease

## 2014-01-04 DIAGNOSIS — R7301 Impaired fasting glucose: Secondary | ICD-10-CM | POA: Diagnosis not present

## 2014-01-04 DIAGNOSIS — E785 Hyperlipidemia, unspecified: Secondary | ICD-10-CM | POA: Diagnosis not present

## 2014-01-04 DIAGNOSIS — I10 Essential (primary) hypertension: Secondary | ICD-10-CM | POA: Diagnosis not present

## 2014-01-04 DIAGNOSIS — E039 Hypothyroidism, unspecified: Secondary | ICD-10-CM | POA: Diagnosis not present

## 2014-01-08 ENCOUNTER — Telehealth: Payer: Self-pay | Admitting: Cardiovascular Disease

## 2014-01-08 DIAGNOSIS — M949 Disorder of cartilage, unspecified: Secondary | ICD-10-CM | POA: Diagnosis not present

## 2014-01-08 DIAGNOSIS — E785 Hyperlipidemia, unspecified: Secondary | ICD-10-CM | POA: Diagnosis not present

## 2014-01-08 DIAGNOSIS — R7301 Impaired fasting glucose: Secondary | ICD-10-CM | POA: Diagnosis not present

## 2014-01-08 DIAGNOSIS — R5381 Other malaise: Secondary | ICD-10-CM | POA: Diagnosis not present

## 2014-01-08 DIAGNOSIS — E559 Vitamin D deficiency, unspecified: Secondary | ICD-10-CM | POA: Diagnosis not present

## 2014-01-08 DIAGNOSIS — I10 Essential (primary) hypertension: Secondary | ICD-10-CM | POA: Diagnosis not present

## 2014-01-08 DIAGNOSIS — M899 Disorder of bone, unspecified: Secondary | ICD-10-CM | POA: Diagnosis not present

## 2014-01-08 DIAGNOSIS — R5383 Other fatigue: Secondary | ICD-10-CM | POA: Diagnosis not present

## 2014-01-08 DIAGNOSIS — E039 Hypothyroidism, unspecified: Secondary | ICD-10-CM | POA: Diagnosis not present

## 2014-01-08 NOTE — Telephone Encounter (Addendum)
8 pages of records received on 01/08/14 from [place]:   Records given to [facility] for patient's appointment with Dr. Claiborne Billings on 03/14/14:djc

## 2014-01-11 ENCOUNTER — Ambulatory Visit (INDEPENDENT_AMBULATORY_CARE_PROVIDER_SITE_OTHER): Payer: Medicare Other

## 2014-01-11 DIAGNOSIS — J309 Allergic rhinitis, unspecified: Secondary | ICD-10-CM

## 2014-01-24 ENCOUNTER — Ambulatory Visit (INDEPENDENT_AMBULATORY_CARE_PROVIDER_SITE_OTHER): Payer: Medicare Other

## 2014-01-24 DIAGNOSIS — J309 Allergic rhinitis, unspecified: Secondary | ICD-10-CM | POA: Diagnosis not present

## 2014-02-08 ENCOUNTER — Ambulatory Visit (INDEPENDENT_AMBULATORY_CARE_PROVIDER_SITE_OTHER): Payer: Medicare Other

## 2014-02-08 DIAGNOSIS — J309 Allergic rhinitis, unspecified: Secondary | ICD-10-CM

## 2014-02-21 ENCOUNTER — Ambulatory Visit: Payer: Medicare Other

## 2014-02-22 ENCOUNTER — Ambulatory Visit (INDEPENDENT_AMBULATORY_CARE_PROVIDER_SITE_OTHER): Payer: Medicare Other

## 2014-02-22 DIAGNOSIS — J309 Allergic rhinitis, unspecified: Secondary | ICD-10-CM

## 2014-02-22 DIAGNOSIS — I781 Nevus, non-neoplastic: Secondary | ICD-10-CM | POA: Diagnosis not present

## 2014-02-22 DIAGNOSIS — L821 Other seborrheic keratosis: Secondary | ICD-10-CM | POA: Diagnosis not present

## 2014-02-28 ENCOUNTER — Ambulatory Visit: Payer: Medicare Other | Admitting: Cardiovascular Disease

## 2014-03-08 ENCOUNTER — Ambulatory Visit (INDEPENDENT_AMBULATORY_CARE_PROVIDER_SITE_OTHER): Payer: Medicare Other

## 2014-03-08 DIAGNOSIS — J309 Allergic rhinitis, unspecified: Secondary | ICD-10-CM | POA: Diagnosis not present

## 2014-03-11 ENCOUNTER — Ambulatory Visit: Payer: Medicare Other | Admitting: Internal Medicine

## 2014-03-11 ENCOUNTER — Encounter: Payer: Self-pay | Admitting: Internal Medicine

## 2014-03-11 VITALS — BP 120/62 | HR 70 | Ht 59.5 in | Wt 148.4 lb

## 2014-03-11 DIAGNOSIS — Z23 Encounter for immunization: Secondary | ICD-10-CM

## 2014-03-11 NOTE — Progress Notes (Signed)
Patient ID: [name], female    DOB: 1932/03/15, 80 y.o.   MRN: [id number]  HPI 02/23/11- 80 yoF never smoker followed for asthma, allergic rhinitis, cough, complicated by GERD Last here August 21, 2010 Holding allergy vaccine at 1:500 to avoid local reactions. Her cough persists with throat tickle. Takes a lot of cough drops. Admits ongoing occasional reflux, which we discussed again. In September we discussed her GI visit with Dr Penelope Coop who had done endoscopy and recommended she take 2 Nexium- insurance won't pay. She has 3 cats- all her life. HOB is elevated.  03/09/2011 -80 yoF never smoker followed for asthma, allergic rhinitis, cough, complicated by GERD Acute visit. For 2 weeks she has had a sore throat with increased postnasal drainage worse cough watery nose. She cancelled her methacholine test because of the cough. Nose and ears are itching. We had doubled her GERD medication by adding omeprazole plus Nexium. She does admit some reflux but not heartburn. Allergy profile: 02/23/2011 IgE 14.1 with elevations for ragweed and goldenrod  04/27/11- 80 yoF never smoker followed for asthma, allergic rhinitis, cough, complicated by GERD Has had flu vaccine. Coughing is a little better. She thinks it might be worse on the days she gets her allergy shots and her son has pointed that out. She does not cough at night. Coughs in the evenings, not related to meals. She doesn't often feel herself refluxing while she takes omeprazole and sleeps with the head of her bed elevated. We again discussed the idea of "cyclic cough". She was sick today she was scheduled to have a methacholine inhalation challenge test. We will reschedule if it seems useful.  10/26/11- 80 yoF never smoker followed for asthma, allergic rhinitis, cough, complicated by GERD After concern that allergy shts were associated w/ incvreased cough, she decided she was better off on them and restarted. Now at 1:500. Some throat clearing and  postnasal drip. No overt reflux. HOB is raised. Failed benzonatate.  01/03/12 Acute OV  pt c/o dry cough x 2-3 years with little to no production. pt states she has had some wheezing at night time and some during the day time. Pt also c/o some chest congestion that comes and goes as well as PND.  pt states she completed a course of Pred with no relief. Called in steroid taper not much help  +nasal drip  No fever or discolored mucus  Never smoker. cxr  02/2011 no acute process.   02/11/12- 80 yoF never smoker followed for asthma, allergic rhinitis, cough, complicated by GERD Has been using non- menthol cough drops to help with cough. Would say cough better since last visit.  Tramadol has been some help. Fewer bad coughing spells, noted mostly after meals. We discussed reflux and cough during swallowing. Uses omeprazole and Nexium. Does occasionally feel reflux events. Occasional mild wheeze but no chest pain or shortness of breath. She feels allergy vaccine does well, currently rebuilding.  05/04/12- 80 yoF never smoker followed for asthma, allergic rhinitis, cough, complicated by GERD FOLLOWS FOR: still having slight cough-productive-slight yellow in color; recently sick. Wheezing as well. Flare of hard persistent dry coughing started while on the telephone a few days ago. She didn't recognize it as a reflux event. Cough is now better but not quite back to baseline. She continues allergy vaccine 1:50 [place]  09/01/12 80 yoF never smoker followed for asthma, allergic rhinitis, cough, complicated by GERD FOLLOWS FOR: still on vaccine; sneezing, runny nose x 4 days.  Son had a cold and now she has caught it. Sick x5 days with nasal itching, sneezing. Some chills yesterday but no sore throat or fever. Continues allergy vaccine 1:50 GH without problems. CXR 05/05/12- IMPRESSION:  No active cardiopulmonary disease. Chronic change.  Original Report Authenticated By: [name], M.D.  03/06/13- 80 yoF  never smoker followed for asthma, allergic rhinitis, cough, complicated by GERD FOLLOWS RAX:ENMMH well,still gets Allergy vaccine 1:50 [facility],had today,tickle in throat,denies cough,sob,wheeze Gets her flu shot from her primary physician. We talked about what to do with allergy vaccine. She gets injections from 2 divided vials and the ragweed side usually itches some.  09/03/13- 80 yoF never smoker followed for asthma, allergic rhinitis, cough, complicated by GERD FOLLOWS FOR: Still on Allergy  Vaccine (A-1:50, B-1:50, GH) and denies any flare ups at this time. Only occasional minor itch local reaction to allergy shots. Separate injections for each vial.  Occ minor wheeze. Denies need for rescue inhaler. Reflux stable, controlled.   03/11/14- 80 yoF never smoker followed for asthma, allergic rhinitis, cough, complicated by GERD Follows for: Pt denies any complaints at this time.  Still on Allergy  Vaccine (A-1:50, B-1:50, GH)    ROS-see HPI Constitutional:   No-   weight loss, night sweats, fevers, chills, fatigue, lassitude. HEENT:   No-  headaches, difficulty swallowing, tooth/dental problems, sore throat,       No-  sneezing, itching, ear ache, nasal congestion, post nasal drip,  CV:  No-   chest pain, orthopnea, PND, swelling in lower extremities, anasarca,  dizziness, palpitations Resp: No-   shortness of breath with exertion or at rest.              No-   productive cough,  No non-productive cough,  No- coughing up of blood.              No-   change in color of mucus.  No- wheezing.   Skin: No-   rash or lesions. GI:  No-   heartburn, indigestion, abdominal pain, nausea, vomiting, GU:  MS:  No-   joint pain or swelling.   Neuro-     nothing unusual Psych:  No- change in mood or affect. No depression or anxiety.  No memory loss.  OBJ- Physical Exam General- Alert, Oriented, Affect-appropriate, Distress- none acute Skin- rash-none, lesions- none, excoriation- none Lymphadenopathy-  none Head- atraumatic            Eyes- Gross vision intact, PERRLA, conjunctivae and secretions clear            Ears- Hearing, canals-normal            Nose- Clear, no-Septal dev, mucus, polyps, erosion, perforation             Throat- Mallampati II , mucosa clear , drainage- none, tonsils- atrophic. + Throat clearing. Neck- flexible , trachea midline, no stridor , thyroid nl, carotid no bruit Chest - symmetrical excursion , unlabored           Heart/CV- RRR , no murmur , no gallop  , no rub, nl s1 s2                           - JVD- none , edema- none, stasis changes- none, varices- none           Lung- clear to P&A, wheeze- none, no- cough , dullness-none, rub- none  Chest wall-  Abd-  Br/ Gen/ Rectal- Not done, not indicated Extrem- cyanosis- none, clubbing, none, atrophy- none, strength- nl Neuro- grossly intact to observation

## 2014-03-11 NOTE — Patient Instructions (Signed)
Flu vax   Prevnar 13 pneumonia vaccine  May return for TDAP vaccination  We can continue allergy vaccine 1:50 GH

## 2014-03-14 ENCOUNTER — Ambulatory Visit (INDEPENDENT_AMBULATORY_CARE_PROVIDER_SITE_OTHER): Payer: Medicare Other | Admitting: Cardiovascular Disease

## 2014-03-14 ENCOUNTER — Encounter: Payer: Self-pay | Admitting: Cardiovascular Disease

## 2014-03-14 VITALS — BP 124/70 | HR 70 | Ht 59.0 in | Wt 148.4 lb

## 2014-03-14 DIAGNOSIS — I4891 Unspecified atrial fibrillation: Secondary | ICD-10-CM

## 2014-03-14 DIAGNOSIS — I10 Essential (primary) hypertension: Secondary | ICD-10-CM

## 2014-03-14 DIAGNOSIS — I442 Atrioventricular block, complete: Secondary | ICD-10-CM

## 2014-03-14 DIAGNOSIS — I480 Paroxysmal atrial fibrillation: Secondary | ICD-10-CM

## 2014-03-14 DIAGNOSIS — E785 Hyperlipidemia, unspecified: Secondary | ICD-10-CM

## 2014-03-14 DIAGNOSIS — Z950 Presence of cardiac pacemaker: Secondary | ICD-10-CM

## 2014-03-14 NOTE — Patient Instructions (Signed)
Continue same medications.   Your physician wants you to follow-up in: 1 year.  You will receive a reminder letter in the mail two months in advance. If you don't receive a letter, please call our office to schedule the follow-up appointment.  

## 2014-03-14 NOTE — Progress Notes (Signed)
Patient ID: [name], female   DOB: 11/19/1931, 80 y.o.   MRN: [id number]     HPI: [name] is a 80 y.o. female who presents to the office today for a one year cardiology evaluation.  [name] iis status post dual-chamber pacemaker implantation for complete heart block. She has a history of paroxysmal atrial fibrillation, a history of lower extremity edema as well as GERD. She also has a history of hypertension as well as hyperlipidemia. Her last pacemaker interrogation was done in June 2015 by Dr. Sallyanne Kuster.  At that time, she had normal pacemaker function.  She essentially was pacing 100% of the time in both the atrium and ventricle.  There were no episodes of mode switch for atrial fibrillation.  Generator longevity was estimated to be approximately 3.5-4 years.  Over the past year, she admits to feeling well.  She does note intermittent ankle swelling.  She takes Lasix on an as-needed basis for this.  She denies any episodes of chest pain.  She's not aware of any palpitations.  She sees Dr. Elyse Hsu.  I did review recent blood work, which was drawn on 01/05/2014.  She normal comprehensive metabolic panel.  Creatinine was 0.88; normal liver function studies.  Lipid panel was excellent with a total cholesterol 141, triglycerides 74, HDL 61, LDL 65.  Hemoglobin A1c was borderline elevated at 5.9.  TSH was normal at 3.2.  Over the past year, she has been on valsartan HCT 160/12.5, Toprol, XL 50 mg twice a day.  In addition to Cardizem  180 mg daily.  She takes Lasix on an as-needed basis.  She presents now for one-year evaluation.   Past Medical History  Diagnosis Date  . Esophageal reflux   . Allergic rhinitis   . Asthmatic bronchitis   . Chronic cough   . Hypertension   . Hyperlipemia   . PAF (paroxysmal atrial fibrillation)   . GERD (gastroesophageal reflux disease)   . Hx of echocardiogram 11/05/2010    EF>55% showed a normal systolic function with impaired diastolic dysfuntion  and also tissue doppler suggesting increased elevated left atrial pressure, she had moderate LA dilatation and mild LA dilatation. There was calcified mitral apparatus with moderate MR, mild to moderate TR and trace aortic insufficiency.  . History of stress test 11/20/2009    This was essentially normal,post-stress EF was hyperdynamic at 73 beats per mins, There was no scar or ischemia.    Past Surgical History  Procedure Laterality Date  . Pacemaker insertion  June 1st 2009    [employer] [place] device, model #5826, serial [id number], The artial lead is a Interior and spatial designer, the ventricular lead is Environmental education officer.  Marland Kitchen Foot surgery      right  . Thyroid surgery    . Vesicovaginal fistula closure w/ tah    . Cholecystectomy    . Tonsillectomy    . Nasal septum surgery      Allergies  Allergen Reactions  . Erythromycin Nausea And Vomiting and Other (See Comments)    Stomach cramps  . Fluticasone-Salmeterol   . Sulfonamide Derivatives     Current Outpatient Prescriptions  Medication Sig Dispense Refill  . aspirin 81 MG tablet Take 81 mg by mouth daily.        . cholecalciferol (VITAMIN D) 1000 UNITS tablet Take 1,000 Units by mouth daily.        Marland Kitchen diltiazem (DILACOR XR) 180 MG 24 hr capsule Take 1 capsule (180 mg total) by  mouth daily.  30 capsule  4  . esomeprazole (NEXIUM) 40 MG capsule Take 40 mg by mouth daily before breakfast.        . furosemide (LASIX) 20 MG tablet Take one tablet by mouth as needed      . LORazepam (ATIVAN) 0.5 MG tablet Take 0.5 mg by mouth 2 (two) times daily as needed.        . metoprolol succinate (TOPROL-XL) 50 MG 24 hr tablet TAKE ONE TABLET BY MOUTH TWICE DAILY   60 tablet  9  . Multiple Vitamin (MULTIVITAMIN) tablet Take 1 tablet by mouth daily.        . simvastatin (ZOCOR) 20 MG tablet Take 10 mg by mouth at bedtime.       . valsartan-hydrochlorothiazide (DIOVAN-HCT) 160-12.5 MG per tablet Take 1 tablet by mouth daily.  90 tablet  3   No current  facility-administered medications for this visit.    History   Social History  . Marital Status: Married    Spouse Name: N/A    Number of Children: N/A  . Years of Education: N/A   Occupational History  .      Retired Sales promotion account executive at [employer]   Social History Main Topics  . Smoking status: Never Smoker   . Smokeless tobacco: Never Used  . Alcohol Use: No  . Drug Use: No  . Sexual Activity: Not on file   Other Topics Concern  . Not on file   Social History Narrative  . No narrative on file    Family History  Problem Relation Age of Onset  . Stroke Mother   . Cancer Father     bladder  . Stroke Sister    Additional social history is notable that she is widowed and has one daughter and 2 sons.  ROS General: Negative; No fevers, chills, or night sweats;  HEENT: Negative; No changes in vision or hearing, sinus congestion, difficulty swallowing Pulmonary: Negative; No cough, wheezing, shortness of breath, hemoptysis Cardiovascular: Negative; No chest pain, presyncope, syncope, palpitations GI: Positive for GERD, controlled with Nexium; No nausea, vomiting, diarrhea, or abdominal pain GU: Negative; No dysuria, hematuria, or difficulty voiding Musculoskeletal: Negative; no myalgias, joint pain, or weakness Hematologic/Oncology: Negative; no easy bruising, bleeding Endocrine: Negative; no heat/cold intolerance; no diabetes Neuro: Negative; no changes in balance, headaches Skin: Negative; No rashes or skin lesions Psychiatric: Negative; No behavioral problems, depression Sleep: Negative; No snoring, daytime sleepiness, hypersomnolence, bruxism, restless legs, hypnogognic hallucinations, no cataplexy Other comprehensive 14 point system review is negative   PE BP 124/70  Pulse 70  Ht 4' 11"  (1.499 m)  Wt 148 lb 6.4 oz (67.314 kg)  BMI 29.96 kg/m2  General: Alert, oriented, no distress.  Skin: normal turgor, no rashes HEENT: Normocephalic, atraumatic.  Pupils round and reactive; sclera anicteric;no lid lag.  Nose without nasal septal hypertrophy Mouth/Parynx benign; Mallinpatti scale 2 Neck: No JVD, no carotid bruits with normal carotid upstroke Lungs: clear to ausculatation and percussion; no wheezing or rales Chest wall: Nontender to palpation Heart: RRR, s1 s2 normal 1/6 systolic murmur; no diastolic murmur.  No S3 or S4 gallop.  No rubs, thrills or heaves. Abdomen: Mild central adiposity; soft, nontender; no hepatosplenomehaly, BS+; abdominal aorta nontender and not dilated by palpation. Back: No CVA Pulses 2+ Extremities: no clubbing cyanosis or edema, Homan's sign negative  Neurologic: grossly nonfocal; cranial nerves grossly normal Psychological: Normal affect and mood appear normal.  Cognitive function normal.  ECG (independently  read by me): AV sequentially paced rhythm with 100% capture.  Ventricular rate 70 beats per minute.  PR interval 224 ms.  August 2014 ECG: 80 paced rhythm at 70 beats per minute. PR interval 222 ms.  LABS:  BMET    Component Value Date/Time   NA 139 11/28/2007 0351   K 3.8 11/28/2007 0351   CL 104 11/28/2007 0351   CO2 27 11/28/2007 0351   GLUCOSE 154* 11/28/2007 0351   BUN 16 11/28/2007 0351   CREATININE 0.96 11/28/2007 0351   CALCIUM 8.7 11/28/2007 0351   GFRNONAA 57* 11/28/2007 0351   GFRAA  Value: >60        The eGFR has been calculated using the MDRD equation. This calculation has not been validated in all clinical 11/28/2007 0351     Hepatic Function Panel     Component Value Date/Time   PROT 5.9* 11/28/2007 0351   ALBUMIN 2.6* 11/28/2007 0351   AST 21 11/28/2007 0351   ALT 29 11/28/2007 0351   ALKPHOS 61 11/28/2007 0351   BILITOT 0.4 11/28/2007 0351     CBC    Component Value Date/Time   WBC 8.7 11/28/2007 0351   RBC 3.13* 11/28/2007 0351   HGB 9.7* 11/28/2007 0351   HCT 28.5* 11/28/2007 0351   PLT 183 11/28/2007 0351   MCV 91.2 11/28/2007 0351   MCHC 33.9 11/28/2007 0351   RDW 12.6 11/28/2007 0351   LYMPHSABS  2.0 11/28/2007 0351   MONOABS 0.8 11/28/2007 0351   EOSABS 0.6 11/28/2007 0351   BASOSABS 0.0 11/28/2007 0351     BNP    Component Value Date/Time   PROBNP 107.0* 11/28/2007 0351    Lipid Panel     Component Value Date/Time   CHOL  Value: 153        ATP III CLASSIFICATION:  <200     mg/dL   Desirable  200-239  mg/dL   Borderline High  >=240    mg/dL   High 11/19/2007 0700   TRIG 87 11/19/2007 0700   HDL 59 11/19/2007 0700   CHOLHDL 2.6 11/19/2007 0700   VLDL 17 11/19/2007 0700   LDLCALC  Value: 77        Total Cholesterol/HDL:CHD Risk Coronary Heart Disease Risk Table                     Men   Women  1/2 Average Risk   3.4   3.3 11/19/2007 0700     RADIOLOGY: No results found.    ASSESSMENT AND PLAN: [name] is now 80 years old.  She continues to do well from a cardiovascular standpoint.  Her blood pressure today is well controlled on combination therapy with valsartan HCT 160/12.5, Toprol-XL, 50 mg twice a day, and diltiazem XL 180 mg daily.  She's not having any breakthrough atrial fibrillation.  She is 100% atrial and ventricular paced and is status post permanent pacemaker for remote history of complete heart block.  Presently, there are no signs of edema.  She takes furosemide 20 mg on an as-needed basis intermittently.  I did review the complete set of blood work, which had been done by Dr. Elyse Hsu.  Her lipid status is excellent on her simvastatin 20 mg.  As long as she remains stable, I will see her one year for followup evaluation.   Troy Sine, MD, [facility]  03/14/2014 1:08 PM

## 2014-03-21 ENCOUNTER — Ambulatory Visit: Payer: Medicare Other

## 2014-03-29 ENCOUNTER — Ambulatory Visit (INDEPENDENT_AMBULATORY_CARE_PROVIDER_SITE_OTHER): Payer: Medicare Other

## 2014-03-29 DIAGNOSIS — J309 Allergic rhinitis, unspecified: Secondary | ICD-10-CM | POA: Diagnosis not present

## 2014-04-09 DIAGNOSIS — R7301 Impaired fasting glucose: Secondary | ICD-10-CM | POA: Diagnosis not present

## 2014-04-09 DIAGNOSIS — E038 Other specified hypothyroidism: Secondary | ICD-10-CM | POA: Diagnosis not present

## 2014-04-09 DIAGNOSIS — I10 Essential (primary) hypertension: Secondary | ICD-10-CM | POA: Diagnosis not present

## 2014-04-09 DIAGNOSIS — E785 Hyperlipidemia, unspecified: Secondary | ICD-10-CM | POA: Diagnosis not present

## 2014-04-11 ENCOUNTER — Ambulatory Visit (INDEPENDENT_AMBULATORY_CARE_PROVIDER_SITE_OTHER): Payer: Medicare Other

## 2014-04-11 DIAGNOSIS — E780 Pure hypercholesterolemia: Secondary | ICD-10-CM | POA: Diagnosis not present

## 2014-04-11 DIAGNOSIS — I10 Essential (primary) hypertension: Secondary | ICD-10-CM | POA: Diagnosis not present

## 2014-04-11 DIAGNOSIS — J309 Allergic rhinitis, unspecified: Secondary | ICD-10-CM

## 2014-04-11 DIAGNOSIS — R5382 Chronic fatigue, unspecified: Secondary | ICD-10-CM | POA: Diagnosis not present

## 2014-04-11 DIAGNOSIS — M8589 Other specified disorders of bone density and structure, multiple sites: Secondary | ICD-10-CM | POA: Diagnosis not present

## 2014-04-11 DIAGNOSIS — E038 Other specified hypothyroidism: Secondary | ICD-10-CM | POA: Diagnosis not present

## 2014-04-11 DIAGNOSIS — R7301 Impaired fasting glucose: Secondary | ICD-10-CM | POA: Diagnosis not present

## 2014-04-11 DIAGNOSIS — E559 Vitamin D deficiency, unspecified: Secondary | ICD-10-CM | POA: Diagnosis not present

## 2014-04-16 ENCOUNTER — Telehealth: Payer: Self-pay | Admitting: Cardiovascular Disease

## 2014-04-16 NOTE — Telephone Encounter (Signed)
Received 10 pages of records from Dr Legrand Como [facility] for patient appointment on 06/06/14 with Dr Sallyanne Kuster.  Records given to [facility] (medical records) for Dr Croitoru's schedule on 06/06/14  lp

## 2014-04-22 ENCOUNTER — Other Ambulatory Visit: Payer: Self-pay | Admitting: Cardiovascular Disease

## 2014-04-22 NOTE — Telephone Encounter (Signed)
Rx was sent to pharmacy electronically. 

## 2014-04-25 ENCOUNTER — Ambulatory Visit: Payer: Medicare Other

## 2014-04-26 ENCOUNTER — Ambulatory Visit (INDEPENDENT_AMBULATORY_CARE_PROVIDER_SITE_OTHER): Payer: Medicare Other

## 2014-04-26 DIAGNOSIS — J309 Allergic rhinitis, unspecified: Secondary | ICD-10-CM | POA: Diagnosis not present

## 2014-05-02 ENCOUNTER — Encounter: Payer: Self-pay | Admitting: Internal Medicine

## 2014-05-09 ENCOUNTER — Ambulatory Visit (INDEPENDENT_AMBULATORY_CARE_PROVIDER_SITE_OTHER): Payer: Medicare Other

## 2014-05-09 DIAGNOSIS — J309 Allergic rhinitis, unspecified: Secondary | ICD-10-CM

## 2014-05-23 ENCOUNTER — Ambulatory Visit: Payer: Medicare Other

## 2014-05-29 DIAGNOSIS — H26492 Other secondary cataract, left eye: Secondary | ICD-10-CM | POA: Diagnosis not present

## 2014-05-29 DIAGNOSIS — Z961 Presence of intraocular lens: Secondary | ICD-10-CM | POA: Diagnosis not present

## 2014-05-29 DIAGNOSIS — D3131 Benign neoplasm of right choroid: Secondary | ICD-10-CM | POA: Diagnosis not present

## 2014-05-29 DIAGNOSIS — H2511 Age-related nuclear cataract, right eye: Secondary | ICD-10-CM | POA: Diagnosis not present

## 2014-06-04 ENCOUNTER — Encounter: Payer: Self-pay | Admitting: Internal Medicine

## 2014-06-04 ENCOUNTER — Ambulatory Visit (INDEPENDENT_AMBULATORY_CARE_PROVIDER_SITE_OTHER): Payer: Medicare Other | Admitting: Internal Medicine

## 2014-06-04 VITALS — BP 136/60 | HR 70 | Ht 59.0 in | Wt 151.8 lb

## 2014-06-04 DIAGNOSIS — J301 Allergic rhinitis due to pollen: Secondary | ICD-10-CM

## 2014-06-04 DIAGNOSIS — J45909 Unspecified asthma, uncomplicated: Secondary | ICD-10-CM | POA: Diagnosis not present

## 2014-06-04 DIAGNOSIS — R05 Cough: Secondary | ICD-10-CM

## 2014-06-04 DIAGNOSIS — J209 Acute bronchitis, unspecified: Secondary | ICD-10-CM

## 2014-06-04 DIAGNOSIS — R059 Cough, unspecified: Secondary | ICD-10-CM

## 2014-06-04 MED ORDER — METHYLPREDNISOLONE ACETATE 80 MG/ML IJ SUSP
80.0000 mg | Freq: Once | INTRAMUSCULAR | Status: AC
  Administered 2014-06-04: 80 mg via INTRAMUSCULAR

## 2014-06-04 MED ORDER — LEVALBUTEROL HCL 0.63 MG/3ML IN NEBU
0.6300 mg | INHALATION_SOLUTION | Freq: Once | RESPIRATORY_TRACT | Status: AC
  Administered 2014-06-04: 0.63 mg via RESPIRATORY_TRACT

## 2014-06-04 MED ORDER — PROMETHAZINE-CODEINE 6.25-10 MG/5ML PO SYRP: 5.0000 mL | ORAL_SOLUTION | Freq: Four times a day (QID) | ORAL | Status: DC | PRN

## 2014-06-04 NOTE — Patient Instructions (Signed)
Neb xop 0.63  Depo 80   Script for cough syrup  Keep scheduled appointment

## 2014-06-04 NOTE — Progress Notes (Signed)
Patient ID: [name], female    DOB: 1932/03/15, 80 y.o.   MRN: [id number]  HPI 02/23/11- 53 yoF never smoker followed for asthma, allergic rhinitis, cough, complicated by GERD Last here August 21, 2010 Holding allergy vaccine at 1:500 to avoid local reactions. Her cough persists with throat tickle. Takes a lot of cough drops. Admits ongoing occasional reflux, which we discussed again. In September we discussed her GI visit with Dr Penelope Coop who had done endoscopy and recommended she take 2 Nexium- insurance won't pay. She has 3 cats- all her life. HOB is elevated.  03/09/2011 -79 yoF never smoker followed for asthma, allergic rhinitis, cough, complicated by GERD Acute visit. For 2 weeks she has had a sore throat with increased postnasal drainage worse cough watery nose. She cancelled her methacholine test because of the cough. Nose and ears are itching. We had doubled her GERD medication by adding omeprazole plus Nexium. She does admit some reflux but not heartburn. Allergy profile: 02/23/2011 IgE 14.1 with elevations for ragweed and goldenrod  04/27/11- 79 yoF never smoker followed for asthma, allergic rhinitis, cough, complicated by GERD Has had flu vaccine. Coughing is a little better. She thinks it might be worse on the days she gets her allergy shots and her son has pointed that out. She does not cough at night. Coughs in the evenings, not related to meals. She doesn't often feel herself refluxing while she takes omeprazole and sleeps with the head of her bed elevated. We again discussed the idea of "cyclic cough". She was sick today she was scheduled to have a methacholine inhalation challenge test. We will reschedule if it seems useful.  10/26/11- 79 yoF never smoker followed for asthma, allergic rhinitis, cough, complicated by GERD After concern that allergy shts were associated w/ incvreased cough, she decided she was better off on them and restarted. Now at 1:500. Some throat clearing and  postnasal drip. No overt reflux. HOB is raised. Failed benzonatate.  01/03/12 Acute OV  pt c/o dry cough x 2-3 years with little to no production. pt states she has had some wheezing at night time and some during the day time. Pt also c/o some chest congestion that comes and goes as well as PND.  pt states she completed a course of Pred with no relief. Called in steroid taper not much help  +nasal drip  No fever or discolored mucus  Never smoker. cxr  02/2011 no acute process.   02/11/12- 80 yoF never smoker followed for asthma, allergic rhinitis, cough, complicated by GERD Has been using non- menthol cough drops to help with cough. Would say cough better since last visit.  Tramadol has been some help. Fewer bad coughing spells, noted mostly after meals. We discussed reflux and cough during swallowing. Uses omeprazole and Nexium. Does occasionally feel reflux events. Occasional mild wheeze but no chest pain or shortness of breath. She feels allergy vaccine does well, currently rebuilding.  05/04/12- 80 yoF never smoker followed for asthma, allergic rhinitis, cough, complicated by GERD FOLLOWS FOR: still having slight cough-productive-slight yellow in color; recently sick. Wheezing as well. Flare of hard persistent dry coughing started while on the telephone a few days ago. She didn't recognize it as a reflux event. Cough is now better but not quite back to baseline. She continues allergy vaccine 1:50 [place]  09/01/12 80 yoF never smoker followed for asthma, allergic rhinitis, cough, complicated by GERD FOLLOWS FOR: still on vaccine; sneezing, runny nose x 4 days.  Son had a cold and now she has caught it. Sick x5 days with nasal itching, sneezing. Some chills yesterday but no sore throat or fever. Continues allergy vaccine 1:50 [facility] without problems. CXR 05/05/12- IMPRESSION:  No active cardiopulmonary disease. Chronic change.  Original Report Authenticated By: [name], M.D.  03/06/13- 38 yoF  never smoker followed for asthma, allergic rhinitis, cough, complicated by GERD FOLLOWS WUJ:WJXBJ well,still gets Allergy vaccine 1:50 [facility],had today,tickle in throat,denies cough,sob,wheeze Gets her flu shot from her primary physician. We talked about what to do with allergy vaccine. She gets injections from 2 divided vials and the ragweed side usually itches some.  09/03/13- 81 yoF never smoker followed for asthma, allergic rhinitis, cough, complicated by GERD FOLLOWS FOR: Still on Allergy  Vaccine (A-1:50, B-1:50, [facility]) and denies any flare ups at this time. Only occasional minor itch local reaction to allergy shots. Separate injections for each vial.  Occ minor wheeze. Denies need for rescue inhaler. Reflux stable, controlled.   03/11/14- 58 yoF never smoker followed for asthma, allergic rhinitis, cough, complicated by GERD Follows for: Pt denies any complaints at this time.  Still on Allergy  Vaccine (A-1:50, B-1:50, [place])  06/04/14-81 yoF never smoker followed for asthma, allergic rhinitis, cough, complicated by GERD ACUTE VISIT: "cold like symptoms", cough-slightly productive, son told her he hears her wheezing as well.  Had a cold in November never really got over it with some persistent wheezy cough since then. Continues allergy vaccine 1:50GH- has helped.  ROS-see HPI Constitutional:   No-   weight loss, night sweats, fevers, chills, fatigue, lassitude. HEENT:   No-  headaches, difficulty swallowing, tooth/dental problems, sore throat,       No-  sneezing, itching, ear ache, nasal congestion, post nasal drip,  CV:  No-   chest pain, orthopnea, PND, swelling in lower extremities, anasarca,  dizziness, palpitations Resp: No-   shortness of breath with exertion or at rest.              No-   productive cough,  + non-productive cough,  No- coughing up of blood.              No-   change in color of mucus. + wheezing.   Skin: No-   rash or lesions. GI:  No-   heartburn, indigestion,  abdominal pain, nausea, vomiting, GU:  MS:  No-   joint pain or swelling.   Neuro-     nothing unusual Psych:  No- change in mood or affect. No depression or anxiety.  No memory loss.  OBJ- Physical Exam General- Alert, Oriented, Affect-appropriate, Distress- none acute Skin- rash-none, lesions- none, excoriation- none Lymphadenopathy- none Head- atraumatic            Eyes- Gross vision intact, PERRLA, conjunctivae and secretions clear            Ears- Hearing, canals-normal            Nose- Clear, no-Septal dev, mucus, polyps, erosion, perforation             Throat- Mallampati II , mucosa clear , drainage- none, tonsils- atrophic. + Throat clearing. Neck- flexible , trachea midline, no stridor , thyroid nl, carotid no bruit Chest - symmetrical excursion , unlabored           Heart/CV- RRR , no murmur , no gallop  , no rub, nl s1 s2                           -  JVD- none , edema- none, stasis changes- none, varices- none           Lung- clear to P&A, wheeze- none, no- cough , dullness-none, rub- none           Chest wall-  Abd-  Br/ Gen/ Rectal- Not done, not indicated Extrem- cyanosis- none, clubbing, none, atrophy- none, strength- nl Neuro- grossly intact to observation

## 2014-06-05 NOTE — Assessment & Plan Note (Signed)
Consistent with a post viral bronchitis. Plan-nebulizer treatment with Xopenex, Depo-Medrol, cough syrup. Options reviewed and current medicines discussed.

## 2014-06-05 NOTE — Assessment & Plan Note (Signed)
We have decided to continue allergy vaccine. We're watching to see how she does with onset of early Spring pollens at the end of February.

## 2014-06-06 ENCOUNTER — Ambulatory Visit (INDEPENDENT_AMBULATORY_CARE_PROVIDER_SITE_OTHER): Payer: Medicare Other

## 2014-06-06 ENCOUNTER — Encounter: Payer: Self-pay | Admitting: Cardiovascular Disease

## 2014-06-06 ENCOUNTER — Ambulatory Visit: Payer: Medicare Other | Admitting: Cardiovascular Disease

## 2014-06-06 ENCOUNTER — Ambulatory Visit (INDEPENDENT_AMBULATORY_CARE_PROVIDER_SITE_OTHER): Payer: Medicare Other | Admitting: Cardiovascular Disease

## 2014-06-06 VITALS — BP 137/82 | HR 80 | Ht <= 58 in | Wt 152.8 lb

## 2014-06-06 DIAGNOSIS — I480 Paroxysmal atrial fibrillation: Secondary | ICD-10-CM

## 2014-06-06 DIAGNOSIS — J309 Allergic rhinitis, unspecified: Secondary | ICD-10-CM

## 2014-06-06 DIAGNOSIS — I10 Essential (primary) hypertension: Secondary | ICD-10-CM

## 2014-06-06 DIAGNOSIS — I442 Atrioventricular block, complete: Secondary | ICD-10-CM

## 2014-06-06 DIAGNOSIS — E785 Hyperlipidemia, unspecified: Secondary | ICD-10-CM | POA: Diagnosis not present

## 2014-06-06 DIAGNOSIS — Z950 Presence of cardiac pacemaker: Secondary | ICD-10-CM | POA: Diagnosis not present

## 2014-06-06 LAB — MDC_IDC_ENUM_SESS_TYPE_INCLINIC
Battery Impedance: 2700 Ohm
Date Time Interrogation Session: 20151217141101
Implantable Pulse Generator Model: 5826
Implantable Pulse Generator Serial Number: 2109576
Lead Channel Impedance Value: 491 Ohm
Lead Channel Pacing Threshold Pulse Width: 0.5 ms
Lead Channel Sensing Intrinsic Amplitude: 2.1 mV
Lead Channel Setting Pacing Pulse Width: 0.5 ms
Lead Channel Setting Sensing Sensitivity: 4 mV
MDC IDC MSMT BATTERY VOLTAGE: 2.79 V
MDC IDC MSMT LEADCHNL RA PACING THRESHOLD AMPLITUDE: 0.5 V
MDC IDC MSMT LEADCHNL RV IMPEDANCE VALUE: 528 Ohm
MDC IDC MSMT LEADCHNL RV PACING THRESHOLD AMPLITUDE: 0.5 V
MDC IDC MSMT LEADCHNL RV PACING THRESHOLD PULSEWIDTH: 0.5 ms
MDC IDC SET LEADCHNL RA PACING AMPLITUDE: 2 V

## 2014-06-06 NOTE — Patient Instructions (Signed)
Dr. Sallyanne Kuster recommends that you schedule a follow-up appointment in: 6 months with pacemaker check.

## 2014-06-08 ENCOUNTER — Encounter: Payer: Self-pay | Admitting: Cardiovascular Disease

## 2014-06-08 NOTE — Progress Notes (Signed)
Patient ID: [name], female   DOB: 07-Jun-1932, 80 y.o.   MRN: [id number]     Reason for office visit CHB, complete heart block  [name] is pacemaker dependent due to complete heart block and is here for a pacemaker check. She had a recent cold and as always, complains of fatigue, but not dyspnea.  Heart rate histogram distribution remains blunted despite aggressive pacemaker rate response settings. It appears that she is very sedentary. We ambulated her in the office today and her heart rate did increase appropriately. She did not feel well when rate response was made even more aggressive.  Otherwise normal pacemaker evaluation (St. Jude Zephyr XL). Estimated generator longevity 2.5-3 years. 100% A paced, V paced, no meaningful tachyarrhythmia recorded.   Allergies  Allergen Reactions  . Erythromycin Nausea And Vomiting and Other (See Comments)    Stomach cramps  . Fluticasone-Salmeterol   . Sulfonamide Derivatives     Current Outpatient Prescriptions  Medication Sig Dispense Refill  . aspirin 81 MG tablet Take 81 mg by mouth daily.      . cholecalciferol (VITAMIN D) 1000 UNITS tablet Take 1,000 Units by mouth daily.      Marland Kitchen DILT-XR 180 MG 24 hr capsule TAKE ONE CAPSULE BY MOUTH ONE TIME DAILY  30 capsule 9  . esomeprazole (NEXIUM) 40 MG capsule Take 40 mg by mouth daily before breakfast.      . furosemide (LASIX) 20 MG tablet Take one tablet by mouth as needed    . LORazepam (ATIVAN) 0.5 MG tablet Take 0.5 mg by mouth 2 (two) times daily as needed.      . metoprolol succinate (TOPROL-XL) 50 MG 24 hr tablet TAKE ONE TABLET BY MOUTH TWICE DAILY  60 tablet 9  . Multiple Vitamin (MULTIVITAMIN) tablet Take 1 tablet by mouth daily.      . promethazine-codeine (PHENERGAN WITH CODEINE) 6.25-10 MG/5ML syrup Take 5 mLs by mouth every 6 (six) hours as needed for cough. 120 mL 0  . simvastatin (ZOCOR) 20 MG tablet Take 10 mg by mouth at bedtime.     . valsartan-hydrochlorothiazide  (DIOVAN-HCT) 160-12.5 MG per tablet Take 1 tablet by mouth daily. 90 tablet 3   No current facility-administered medications for this visit.    Past Medical History  Diagnosis Date  . Esophageal reflux   . Allergic rhinitis   . Asthmatic bronchitis   . Chronic cough   . Hypertension   . Hyperlipemia   . PAF (paroxysmal atrial fibrillation)   . GERD (gastroesophageal reflux disease)   . Hx of echocardiogram 11/05/2010    EF>55% showed a normal systolic function with impaired diastolic dysfuntion and also tissue doppler suggesting increased elevated left atrial pressure, she had moderate LA dilatation and mild LA dilatation. There was calcified mitral apparatus with moderate MR, mild to moderate TR and trace aortic insufficiency.  . History of stress test 11/20/2009    This was essentially normal,post-stress EF was hyperdynamic at 73 beats per mins, There was no scar or ischemia.    Past Surgical History  Procedure Laterality Date  . Pacemaker insertion  June 1st 2009    St Jude [place] device, model #5826, serial [id number], The artial lead is a Interior and spatial designer, the ventricular lead is Environmental education officer.  Marland Kitchen Foot surgery      right  . Thyroid surgery    . Vesicovaginal fistula closure w/ tah    . Cholecystectomy    . Tonsillectomy    .  Nasal septum surgery      Family History  Problem Relation Age of Onset  . Stroke Mother   . Cancer Father     bladder  . Stroke Sister     History   Social History  . Marital Status: Married    Spouse Name: N/A    Number of Children: N/A  . Years of Education: N/A   Occupational History  .      Retired Sales promotion account executive at [employer]   Social History Main Topics  . Smoking status: Never Smoker   . Smokeless tobacco: Never Used  . Alcohol Use: No  . Drug Use: No  . Sexual Activity: Not on file   Other Topics Concern  . Not on file   Social History Narrative    Review of systems: The patient specifically denies any chest  pain at rest or with exertion, dyspnea at rest or with exertion, orthopnea, paroxysmal nocturnal dyspnea, syncope, palpitations, focal neurological deficits, intermittent claudication, lower extremity edema, unexplained weight gain, cough, hemoptysis. Rare wheezing.  The patient also denies abdominal pain, nausea, vomiting, dysphagia, diarrhea, constipation, polyuria, polydipsia, dysuria, hematuria, frequency, urgency, abnormal bleeding or bruising, fever, chills, unexpected weight changes, mood swings, change in skin or hair texture, change in voice quality, auditory or visual problems, allergic reactions or rashes, new musculoskeletal complaints other than usual "aches and pains".   PHYSICAL EXAM BP 137/82 mmHg  Pulse 80  Ht $R'4\' 9"'fD$  (1.448 m)  Wt 152 lb 12.8 oz (69.31 kg)  BMI 33.06 kg/m2 General: Alert, oriented x3, no distress Head: no evidence of trauma, PERRL, EOMI, no exophtalmos or lid lag, no myxedema, no xanthelasma; normal ears, nose and oropharynx Neck: normal jugular venous pulsations and no hepatojugular reflux; brisk carotid pulses without delay and no carotid bruits Chest: clear to auscultation, no signs of consolidation by percussion or palpation, normal fremitus, symmetrical and full respiratory excursions; healthy pacemaker site Cardiovascular: normal position and quality of the apical impulse, regular rhythm, normal first and second heart sounds, no murmurs, rubs or gallops Abdomen: no tenderness or distention, no masses by palpation, no abnormal pulsatility or arterial bruits, normal bowel sounds, no hepatosplenomegaly Extremities: no clubbing, cyanosis or edema; 2+ radial, ulnar and brachial pulses bilaterally; 2+ right femoral, posterior tibial and dorsalis pedis pulses; 2+ left femoral, posterior tibial and dorsalis pedis pulses; no subclavian or femoral bruits Neurological: grossly nonfocal  Lipid Panel     Component Value Date/Time   CHOL  11/19/2007 0700    153          ATP III CLASSIFICATION:  <200     mg/dL   Desirable  200-239  mg/dL   Borderline High  >=240    mg/dL   High   TRIG 87 11/19/2007 0700   HDL 59 11/19/2007 0700   CHOLHDL 2.6 11/19/2007 0700   VLDL 17 11/19/2007 0700   LDLCALC  11/19/2007 0700    77        Total Cholesterol/HDL:CHD Risk Coronary Heart Disease Risk Table                     Men   Women  1/2 Average Risk   3.4   3.3    BMET    Component Value Date/Time   NA 139 11/28/2007 0351   K 3.8 11/28/2007 0351   CL 104 11/28/2007 0351   CO2 27 11/28/2007 0351   GLUCOSE 154* 11/28/2007 0351   BUN 16 11/28/2007  0351   CREATININE 0.96 11/28/2007 0351   CALCIUM 8.7 11/28/2007 0351   GFRNONAA 57* 11/28/2007 0351   GFRAA  11/28/2007 0351    >60        The eGFR has been calculated using the MDRD equation. This calculation has not been validated in all clinical     ASSESSMENT AND PLAN Pacemaker She has a dual-chamber St. Jude device with normal function. There is 100% ventricular pacing secondary to complete heart block and she is pacemaker dependent. There is also 100% atrial pacing. She has roughly 3 years of battery life expectancy at this time. No arrhythmia has been recorded by the device. Lead parameters appear excellent.  HTN (hypertension) Excellent control  Hyperlipidemia Labs performed in Dr. Darryl Nestle office.  Patient Instructions  Dr. Sallyanne Kuster recommends that you schedule a follow-up appointment in: 6 months with pacemaker check.      Orders Placed This Encounter  Procedures  . Implantable device check   No orders of the defined types were placed in this encounter.    Holli Humbles, MD, [place] [phone] office [phone] pager

## 2014-06-18 ENCOUNTER — Encounter: Payer: Self-pay | Admitting: Endocrinology

## 2014-06-20 ENCOUNTER — Ambulatory Visit (INDEPENDENT_AMBULATORY_CARE_PROVIDER_SITE_OTHER): Payer: Medicare Other

## 2014-06-20 DIAGNOSIS — J309 Allergic rhinitis, unspecified: Secondary | ICD-10-CM

## 2014-06-21 ENCOUNTER — Other Ambulatory Visit: Payer: Self-pay | Admitting: Cardiovascular Disease

## 2014-06-24 NOTE — Telephone Encounter (Signed)
Rx(s) sent to pharmacy electronically.  

## 2014-06-26 ENCOUNTER — Encounter: Payer: Self-pay | Admitting: Internal Medicine

## 2014-07-01 ENCOUNTER — Telehealth: Payer: Self-pay | Admitting: Internal Medicine

## 2014-07-01 MED ORDER — DOXYCYCLINE HYCLATE 100 MG PO CAPS: ORAL_CAPSULE | ORAL | Status: DC

## 2014-07-01 NOTE — Telephone Encounter (Signed)
Offer doxycycline 100 mg, # 8, 2 today then one daily. I think this can be in capsule form.

## 2014-07-01 NOTE — Telephone Encounter (Signed)
Called spoke with patient who reported that her sick symptoms did improve after seeing CY on 12.15.15 (received ,neb, depo 80, phen-cod syrup) but her symptoms never completely resolved and then worsened again on 1.5.16 w/ prod cough with yellow mucus, wheezing and feeling feverish.  Pt denies any tightness, n/v/d, chills/sweats, hemoptysis.   Pt is requesting rx be sent to [facility] - she would prefer something that comes in capsule form please. Dr Annamaria Boots please advise, thank you. Allergies  Allergen Reactions  . Erythromycin Nausea And Vomiting and Other (See Comments)    Stomach cramps  . Fluticasone-Salmeterol   . Sulfonamide Derivatives     Current Outpatient Prescriptions on File Prior to Visit  Medication Sig Dispense Refill  . aspirin 81 MG tablet Take 81 mg by mouth daily.      . cholecalciferol (VITAMIN D) 1000 UNITS tablet Take 1,000 Units by mouth daily.      Marland Kitchen DILT-XR 180 MG 24 hr capsule TAKE ONE CAPSULE BY MOUTH ONE TIME DAILY  30 capsule 9  . esomeprazole (NEXIUM) 40 MG capsule Take 40 mg by mouth daily before breakfast.      . furosemide (LASIX) 20 MG tablet Take one tablet by mouth as needed    . LORazepam (ATIVAN) 0.5 MG tablet Take 0.5 mg by mouth 2 (two) times daily as needed.      . metoprolol succinate (TOPROL-XL) 50 MG 24 hr tablet TAKE ONE TABLET BY MOUTH TWICE DAILY  60 tablet 11  . Multiple Vitamin (MULTIVITAMIN) tablet Take 1 tablet by mouth daily.      . promethazine-codeine (PHENERGAN WITH CODEINE) 6.25-10 MG/5ML syrup Take 5 mLs by mouth every 6 (six) hours as needed for cough. 120 mL 0  . simvastatin (ZOCOR) 20 MG tablet Take 10 mg by mouth at bedtime.     . valsartan-hydrochlorothiazide (DIOVAN-HCT) 160-12.5 MG per tablet Take 1 tablet by mouth daily. 90 tablet 3   No current facility-administered medications on file prior to visit.

## 2014-07-01 NOTE — Telephone Encounter (Signed)
Pt is aware of Rx per CY and aware I have sent to pharmacy (confirmed) on file. Nothing more needed at this time.

## 2014-07-04 ENCOUNTER — Encounter: Payer: Self-pay | Admitting: Cardiovascular Disease

## 2014-07-04 ENCOUNTER — Ambulatory Visit: Payer: Medicare Other

## 2014-07-05 ENCOUNTER — Telehealth: Payer: Self-pay | Admitting: Internal Medicine

## 2014-07-05 ENCOUNTER — Encounter (INDEPENDENT_AMBULATORY_CARE_PROVIDER_SITE_OTHER): Payer: Self-pay

## 2014-07-05 ENCOUNTER — Encounter: Payer: Self-pay | Admitting: Pulmonary Disease

## 2014-07-05 ENCOUNTER — Ambulatory Visit (INDEPENDENT_AMBULATORY_CARE_PROVIDER_SITE_OTHER): Payer: Medicare Other | Admitting: Pulmonary Disease

## 2014-07-05 VITALS — BP 142/68 | HR 69 | Temp 98.5°F | Ht 59.0 in | Wt 158.4 lb

## 2014-07-05 DIAGNOSIS — J209 Acute bronchitis, unspecified: Secondary | ICD-10-CM | POA: Diagnosis not present

## 2014-07-05 MED ORDER — MOMETASONE FURO-FORMOTEROL FUM 100-5 MCG/ACT IN AERO: 2.0000 | INHALATION_SPRAY | Freq: Two times a day (BID) | RESPIRATORY_TRACT | Status: DC

## 2014-07-05 MED ORDER — PREDNISONE 10 MG PO TABS: ORAL_TABLET | ORAL | Status: DC

## 2014-07-05 NOTE — Patient Instructions (Signed)
Prednisone 10 mg pill >> 1 pill daily for 2 days, then 1/2 pill daily for 2 days Dulera two puffs daily for 1 week, then as needed Finish course of doxcycline Follow up with Dr. Annamaria Boots or Tammy Parrett in 2 weeks

## 2014-07-05 NOTE — Progress Notes (Signed)
Chief Complaint  Patient presents with  . Acute Visit    severe cough; yellow phlegm; raw feeling in chest     History of Present Illness: [name] is a 81 y.o. female with asthma, and allergic rhinitis with chronic cough.  She is followed by Dr. Annamaria Boots.  She was tx for exacerbation in December 2015.  She was started on doxycycline on 07/01/14.  She still has cough with clear to yellow sputum.  She feels like she still gets some wheezing also.  She is not having fever, sinus congestion, sore throat, chest pain, hemoptysis, skin rash, abdominal pain, or leg swelling.   Past medical hx >> GERD, HTN, HLD, PAF  Past surgical hx, Medications, Allergies, Family hx, Social hx all reviewed.   Physical Exam: Blood pressure 142/68, pulse 69, temperature 98.5 F (36.9 C), temperature source Oral, height 4\' 11"  (1.499 m), weight 158 lb 6.4 oz (71.85 kg), SpO2 96 %. Body mass index is 31.98 kg/(m^2).  General - No distress ENT - No sinus tenderness, no oral exudate, no LAN Cardiac - s1s2 regular, no murmur Chest - No wheeze/rales/dullness Back - No focal tenderness Abd - Soft, non-tender Ext - No edema Neuro - Normal strength Skin - No rashes Psych - normal mood, and behavior   Assessment/Plan:  Acute asthmatic bronchitis. Plan: - she should finish course of doxycycline - will give course of prednisone - will have her use dulera for one week, then as needed - defer lab testing or chest xray at this time >> no clinical evidence for lower respiratory infection   Chesley Mires, MD [facility] Pager:  [phone]

## 2014-07-05 NOTE — Telephone Encounter (Signed)
Called and spoke with pt and she stated that she was given doxy on Monday.  She is still coughing with yellow sputum.  She feels that she has bronchitis.   CY is not in the office today.  VS had an appt at 3:30 today and pt agreed to see him.  appt scheduled.  Pt is aware.

## 2014-07-09 ENCOUNTER — Telehealth: Payer: Self-pay | Admitting: Pulmonary Disease

## 2014-07-09 NOTE — Telephone Encounter (Signed)
Pt saw Dr Halford Chessman 07/05/14 for sick visit.  Given rx for [facility] but pt could not afford ($137).  Finished pred taper last night and Doxy several days ago.  PT still c/o occas prod cough (yellow) worse with talk or laugh, wheezing with coughing spells.  Denies fever, sob or chest tightness.  Please advise.  Ok to leave message on machine.  Allergies  Allergen Reactions  . Erythromycin Nausea And Vomiting and Other (See Comments)    Stomach cramps  . Fluticasone-Salmeterol   . Sulfonamide Derivatives     Current Outpatient Prescriptions on File Prior to Visit  Medication Sig Dispense Refill  . aspirin 81 MG tablet Take 81 mg by mouth daily.      . cholecalciferol (VITAMIN D) 1000 UNITS tablet Take 1,000 Units by mouth daily.      Marland Kitchen DILT-XR 180 MG 24 hr capsule TAKE ONE CAPSULE BY MOUTH ONE TIME DAILY  30 capsule 9  . esomeprazole (NEXIUM) 40 MG capsule Take 40 mg by mouth daily before breakfast.      . furosemide (LASIX) 20 MG tablet Take one tablet by mouth as needed    . LORazepam (ATIVAN) 0.5 MG tablet Take 0.5 mg by mouth 2 (two) times daily as needed.      . metoprolol succinate (TOPROL-XL) 50 MG 24 hr tablet TAKE ONE TABLET BY MOUTH TWICE DAILY  60 tablet 11  . Multiple Vitamin (MULTIVITAMIN) tablet Take 1 tablet by mouth daily.      . simvastatin (ZOCOR) 20 MG tablet Take 10 mg by mouth at bedtime.     . valsartan-hydrochlorothiazide (DIOVAN-HCT) 160-12.5 MG per tablet Take 1 tablet by mouth daily. 90 tablet 3   No current facility-administered medications on file prior to visit.

## 2014-07-09 NOTE — Telephone Encounter (Signed)
Per SN- If pt is unable to afford med-can give sample. Pt will need to contact insurance to see what is on formulary and contact us back. Pt will also need f/u appt.   Called and spoke to pt. Informed pt of the recs per SN. Pt verbalized understanding and stated she will call her insurance and call back. Sample left up front for pt to pick up. Pt has upcoming appt with TP on 07/22/2014.

## 2014-07-19 DIAGNOSIS — E780 Pure hypercholesterolemia: Secondary | ICD-10-CM | POA: Diagnosis not present

## 2014-07-19 DIAGNOSIS — E559 Vitamin D deficiency, unspecified: Secondary | ICD-10-CM | POA: Diagnosis not present

## 2014-07-19 DIAGNOSIS — E038 Other specified hypothyroidism: Secondary | ICD-10-CM | POA: Diagnosis not present

## 2014-07-19 DIAGNOSIS — R7301 Impaired fasting glucose: Secondary | ICD-10-CM | POA: Diagnosis not present

## 2014-07-19 DIAGNOSIS — I10 Essential (primary) hypertension: Secondary | ICD-10-CM | POA: Diagnosis not present

## 2014-07-22 ENCOUNTER — Encounter: Payer: Self-pay | Admitting: Adult Health

## 2014-07-22 ENCOUNTER — Ambulatory Visit (INDEPENDENT_AMBULATORY_CARE_PROVIDER_SITE_OTHER): Payer: Medicare Other | Admitting: Adult Health

## 2014-07-22 VITALS — BP 126/64 | HR 70 | Temp 97.9°F | Ht <= 58 in | Wt 158.2 lb

## 2014-07-22 DIAGNOSIS — J301 Allergic rhinitis due to pollen: Secondary | ICD-10-CM

## 2014-07-22 DIAGNOSIS — J209 Acute bronchitis, unspecified: Secondary | ICD-10-CM

## 2014-07-22 DIAGNOSIS — K219 Gastro-esophageal reflux disease without esophagitis: Secondary | ICD-10-CM

## 2014-07-22 NOTE — Assessment & Plan Note (Signed)
Flare with chronic cough   Plan  Add Zyrtec 10mg  At bedtime  .  Sips of water to soothe the throat , try not to cough or clear your throat.  Please contact office for sooner follow up if symptoms do not improve or worsen or seek emergency care  Follow up Dr. Annamaria Boots  As planned and As needed

## 2014-07-22 NOTE — Assessment & Plan Note (Signed)
Recent flare now resolving   Plan  Continue on Protonix  40mg  daily before meal  Add Pepcid 20mg  At bedtime   Delsym 2 tsp Twice daily  As needed  Cough .  Add Zyrtec 10mg  At bedtime  .  Sips of water to soothe the throat , try not to cough or clear your throat.  Please contact office for sooner follow up if symptoms do not improve or worsen or seek emergency care  Follow up Dr. Annamaria Boots  As planned and As needed

## 2014-07-22 NOTE — Assessment & Plan Note (Signed)
Flare despite PPI   Plan  Continue on Protonix  40mg  daily before meal  Add Pepcid 20mg  At bedtime   Sips of water to soothe the throat , try not to cough or clear your throat.  Please contact office for sooner follow up if symptoms do not improve or worsen or seek emergency care  Follow up Dr. Annamaria Boots  As planned and As needed

## 2014-07-22 NOTE — Patient Instructions (Addendum)
Continue on Protonix  40mg  daily before meal  Add Pepcid 20mg  At bedtime   Delsym 2 tsp Twice daily  As needed  Cough .  Add Zyrtec 10mg  At bedtime  .  Sips of water to soothe the throat , try not to cough or clear your throat.  Please contact office for sooner follow up if symptoms do not improve or worsen or seek emergency care  Follow up Dr. Annamaria Boots  As planned and As needed

## 2014-07-22 NOTE — Progress Notes (Signed)
Patient ID: [name], female    DOB: 1932/03/15, 81 y.o.   MRN: [id number]  HPI 02/23/11- 53 yoF never smoker followed for asthma, allergic rhinitis, cough, complicated by GERD Last here August 21, 2010 Holding allergy vaccine at 1:500 to avoid local reactions. Her cough persists with throat tickle. Takes a lot of cough drops. Admits ongoing occasional reflux, which we discussed again. In September we discussed her GI visit with Dr Penelope Coop who had done endoscopy and recommended she take 2 Nexium- insurance won't pay. She has 3 cats- all her life. HOB is elevated.  03/09/2011 -79 yoF never smoker followed for asthma, allergic rhinitis, cough, complicated by GERD Acute visit. For 2 weeks she has had a sore throat with increased postnasal drainage worse cough watery nose. She cancelled her methacholine test because of the cough. Nose and ears are itching. We had doubled her GERD medication by adding omeprazole plus Nexium. She does admit some reflux but not heartburn. Allergy profile: 02/23/2011 IgE 14.1 with elevations for ragweed and goldenrod  04/27/11- 79 yoF never smoker followed for asthma, allergic rhinitis, cough, complicated by GERD Has had flu vaccine. Coughing is a little better. She thinks it might be worse on the days she gets her allergy shots and her son has pointed that out. She does not cough at night. Coughs in the evenings, not related to meals. She doesn't often feel herself refluxing while she takes omeprazole and sleeps with the head of her bed elevated. We again discussed the idea of "cyclic cough". She was sick today she was scheduled to have a methacholine inhalation challenge test. We will reschedule if it seems useful.  10/26/11- 79 yoF never smoker followed for asthma, allergic rhinitis, cough, complicated by GERD After concern that allergy shts were associated w/ incvreased cough, she decided she was better off on them and restarted. Now at 1:500. Some throat clearing and  postnasal drip. No overt reflux. HOB is raised. Failed benzonatate.  01/03/12 Acute OV  pt c/o dry cough x 2-3 years with little to no production. pt states she has had some wheezing at night time and some during the day time. Pt also c/o some chest congestion that comes and goes as well as PND.  pt states she completed a course of Pred with no relief. Called in steroid taper not much help  +nasal drip  No fever or discolored mucus  Never smoker. cxr  02/2011 no acute process.   02/11/12- 80 yoF never smoker followed for asthma, allergic rhinitis, cough, complicated by GERD Has been using non- menthol cough drops to help with cough. Would say cough better since last visit.  Tramadol has been some help. Fewer bad coughing spells, noted mostly after meals. We discussed reflux and cough during swallowing. Uses omeprazole and Nexium. Does occasionally feel reflux events. Occasional mild wheeze but no chest pain or shortness of breath. She feels allergy vaccine does well, currently rebuilding.  05/04/12- 80 yoF never smoker followed for asthma, allergic rhinitis, cough, complicated by GERD FOLLOWS FOR: still having slight cough-productive-slight yellow in color; recently sick. Wheezing as well. Flare of hard persistent dry coughing started while on the telephone a few days ago. She didn't recognize it as a reflux event. Cough is now better but not quite back to baseline. She continues allergy vaccine 1:50 [place]  09/01/12 80 yoF never smoker followed for asthma, allergic rhinitis, cough, complicated by GERD FOLLOWS FOR: still on vaccine; sneezing, runny nose x 4 days.  Son had a cold and now she has caught it. Sick x5 days with nasal itching, sneezing. Some chills yesterday but no sore throat or fever. Continues allergy vaccine 1:50 [place] without problems. CXR 05/05/12- IMPRESSION:  No active cardiopulmonary disease. Chronic change.  Original Report Authenticated By: [name], M.D.  03/06/13- 65 yoF  never smoker followed for asthma, allergic rhinitis, cough, complicated by GERD FOLLOWS ZGY:FVCBS well,still gets Allergy vaccine 1:50 [place],had today,tickle in throat,denies cough,sob,wheeze Gets her flu shot from her primary physician. We talked about what to do with allergy vaccine. She gets injections from 2 divided vials and the ragweed side usually itches some.  09/03/13- 81 yoF never smoker followed for asthma, allergic rhinitis, cough, complicated by GERD FOLLOWS FOR: Still on Allergy  Vaccine (A-1:50, B-1:50, [facility]) and denies any flare ups at this time. Only occasional minor itch local reaction to allergy shots. Separate injections for each vial.  Occ minor wheeze. Denies need for rescue inhaler. Reflux stable, controlled.   03/11/14- 89 yoF never smoker followed for asthma, allergic rhinitis, cough, complicated by GERD Follows for: Pt denies any complaints at this time.  Still on Allergy  Vaccine (A-1:50, B-1:50, [place])  06/04/14-81 yoF never smoker followed for asthma, allergic rhinitis, cough, complicated by GERD ACUTE VISIT: "cold like symptoms", cough-slightly productive, son told her he hears her wheezing as well.  Had a cold in November never really got over it with some persistent wheezy cough since then. Continues allergy vaccine 1:50GH- has helped.  07/22/2014  82yoF never smoker followed for asthma, allergic rhinitis, cough, complicated by GERD Returns for  2 week follow up for asthma flare. Tx w/ pred taper.  She is feeling better.  Reports breathing is back to baseline except for some residual cough ?reflux Says she still get drainage in her throat. Frequent tickle and throat clearing.  Has increased GERD at night despite PPI daily.  She denies any fever, chest pain, orthopnea, PND or leg swelling   ROS-see HPI Constitutional:   No-   weight loss, night sweats, fevers, chills, fatigue, lassitude. HEENT:   No-  headaches, difficulty swallowing, tooth/dental problems, sore  throat,       No-  sneezing, itching, ear ache, nasal congestion, post nasal drip,  CV:  No-   chest pain, orthopnea, PND, swelling in lower extremities, anasarca,  dizziness, palpitations Resp: No-   shortness of breath with exertion or at rest.              No-   productive cough,  + non-productive cough,  No- coughing up of blood.              No-   change in color of mucus.  Skin: No-   rash or lesions. GI:  No-   , abdominal pain, nausea, vomiting, GU:  MS:  No-   joint pain or swelling.   Neuro-     nothing unusual Psych:  No- change in mood or affect. No depression or anxiety.  No memory loss.  OBJ- Physical Exam General- Alert, Oriented, Affect-appropriate, Distress- none acute Skin- rash-none, lesions- none, excoriation- none Lymphadenopathy- none Head- atraumatic            Eyes- Gross vision intact, PERRLA, conjunctivae and secretions clear            Ears- Hearing, canals-normal            Nose- Clear, no-Septal dev, mucus, polyps, erosion, perforation  Throat- Mallampati II , mucosa clear , drainage- none, tonsils- atrophic. + Throat clearing. Neck- flexible , trachea midline, no stridor , thyroid nl, carotid no bruit Chest - symmetrical excursion , unlabored           Heart/CV- RRR , no murmur , no gallop  , no rub, nl s1 s2                           - JVD- none , edema- none, stasis changes- none, varices- none           Lung- clear to P&A, wheeze- none, no- cough , dullness-none, rub- none           Chest wall-  Abd-  Br/ Gen/ Rectal- Not done, not indicated Extrem- cyanosis- none, clubbing, none, atrophy- none, strength- nl Neuro- grossly intact to observation

## 2014-07-23 DIAGNOSIS — E780 Pure hypercholesterolemia: Secondary | ICD-10-CM | POA: Diagnosis not present

## 2014-07-23 DIAGNOSIS — I10 Essential (primary) hypertension: Secondary | ICD-10-CM | POA: Diagnosis not present

## 2014-07-23 DIAGNOSIS — M8589 Other specified disorders of bone density and structure, multiple sites: Secondary | ICD-10-CM | POA: Diagnosis not present

## 2014-07-23 DIAGNOSIS — R7301 Impaired fasting glucose: Secondary | ICD-10-CM | POA: Diagnosis not present

## 2014-07-23 DIAGNOSIS — E559 Vitamin D deficiency, unspecified: Secondary | ICD-10-CM | POA: Diagnosis not present

## 2014-07-23 DIAGNOSIS — E038 Other specified hypothyroidism: Secondary | ICD-10-CM | POA: Diagnosis not present

## 2014-07-23 DIAGNOSIS — R5382 Chronic fatigue, unspecified: Secondary | ICD-10-CM | POA: Diagnosis not present

## 2014-08-02 ENCOUNTER — Ambulatory Visit: Payer: Medicare Other

## 2014-08-08 ENCOUNTER — Ambulatory Visit: Payer: PRIVATE HEALTH INSURANCE

## 2014-08-09 ENCOUNTER — Ambulatory Visit: Payer: Medicare Other

## 2014-08-16 ENCOUNTER — Ambulatory Visit (INDEPENDENT_AMBULATORY_CARE_PROVIDER_SITE_OTHER): Payer: Medicare Other

## 2014-08-16 DIAGNOSIS — J309 Allergic rhinitis, unspecified: Secondary | ICD-10-CM | POA: Diagnosis not present

## 2014-08-19 ENCOUNTER — Other Ambulatory Visit: Payer: Self-pay | Admitting: Cardiovascular Disease

## 2014-08-19 NOTE — Telephone Encounter (Signed)
Rx has been sent to the pharmacy electronically. ° °

## 2014-08-29 ENCOUNTER — Ambulatory Visit: Payer: PRIVATE HEALTH INSURANCE

## 2014-09-03 ENCOUNTER — Telehealth: Payer: Self-pay | Admitting: Cardiovascular Disease

## 2014-09-03 NOTE — Telephone Encounter (Signed)
Pt. States she is sob sometimes but is going to see her pulmonologist on Monday about that but she needs to make an appt. With dr. Claiborne Billings about the swelling in her feet and legs, pt. Directed to the schedulers

## 2014-09-03 NOTE — Telephone Encounter (Signed)
Pt c/o swelling: STAT is pt has developed SOB within 24 hours  1. How long have you been experiencing swelling? Last couple of weeks  2. Where is the swelling located? Legs and ankles  3.  Are you currently taking a "fluid pill"?yes furosemide  4.  Are you currently SOB? Little   5.  Have you traveled recently?no

## 2014-09-09 ENCOUNTER — Other Ambulatory Visit (INDEPENDENT_AMBULATORY_CARE_PROVIDER_SITE_OTHER): Payer: Medicare Other

## 2014-09-09 ENCOUNTER — Ambulatory Visit (INDEPENDENT_AMBULATORY_CARE_PROVIDER_SITE_OTHER)
Admission: RE | Admit: 2014-09-09 | Discharge: 2014-09-09 | Disposition: A | Discharge status: Home / Self Care | Payer: Medicare Other | Source: Ambulatory Visit | Attending: Internal Medicine | Admitting: Internal Medicine

## 2014-09-09 ENCOUNTER — Ambulatory Visit: Payer: Medicare Other | Admitting: Internal Medicine

## 2014-09-09 ENCOUNTER — Encounter: Payer: Self-pay | Admitting: Internal Medicine

## 2014-09-09 VITALS — BP 132/58 | HR 70 | Ht <= 58 in | Wt 159.0 lb

## 2014-09-09 DIAGNOSIS — R053 Chronic cough: Secondary | ICD-10-CM

## 2014-09-09 DIAGNOSIS — R05 Cough: Secondary | ICD-10-CM

## 2014-09-09 DIAGNOSIS — R609 Edema, unspecified: Secondary | ICD-10-CM

## 2014-09-09 DIAGNOSIS — R0602 Shortness of breath: Secondary | ICD-10-CM

## 2014-09-09 LAB — BRAIN NATRIURETIC PEPTIDE: PRO B NATRI PEPTIDE: 101 pg/mL — AB (ref 0.0–100.0)

## 2014-09-09 MED ORDER — GABAPENTIN 100 MG PO CAPS: 100.0000 mg | ORAL_CAPSULE | Freq: Three times a day (TID) | ORAL | Status: DC

## 2014-09-09 NOTE — Progress Notes (Signed)
Patient ID: [name], female    DOB: [birth year], 81 y.o.   MRN: [id number]  HPI 02/23/11- 62 yoF never smoker followed for asthma, allergic rhinitis, cough, complicated by GERD Last here August 21, 2010 Holding allergy vaccine at 1:500 to avoid local reactions. Her cough persists with throat tickle. Takes a lot of cough drops. Admits ongoing occasional reflux, which we discussed again. In September we discussed her GI visit with Dr Penelope Coop who had done endoscopy and recommended she take 2 Nexium- insurance won't pay. She has 3 cats- all her life. HOB is elevated.  03/09/2011 -79 yoF never smoker followed for asthma, allergic rhinitis, cough, complicated by GERD Acute visit. For 2 weeks she has had a sore throat with increased postnasal drainage worse cough watery nose. She cancelled her methacholine test because of the cough. Nose and ears are itching. We had doubled her GERD medication by adding omeprazole plus Nexium. She does admit some reflux but not heartburn. Allergy profile: 02/23/2011 IgE 14.1 with elevations for ragweed and goldenrod  04/27/11- 79 yoF never smoker followed for asthma, allergic rhinitis, cough, complicated by GERD Has had flu vaccine. Coughing is a little better. She thinks it might be worse on the days she gets her allergy shots and her son has pointed that out. She does not cough at night. Coughs in the evenings, not related to meals. She doesn't often feel herself refluxing while she takes omeprazole and sleeps with the head of her bed elevated. We again discussed the idea of "cyclic cough". She was sick today she was scheduled to have a methacholine inhalation challenge test. We will reschedule if it seems useful.  10/26/11- 79 yoF never smoker followed for asthma, allergic rhinitis, cough, complicated by GERD After concern that allergy shts were associated w/ incvreased cough, she decided she was better off on them and restarted. Now at 1:500. Some throat clearing and  postnasal drip. No overt reflux. HOB is raised. Failed benzonatate.  01/03/12 Acute OV  pt c/o dry cough x 2-3 years with little to no production. pt states she has had some wheezing at night time and some during the day time. Pt also c/o some chest congestion that comes and goes as well as PND.  pt states she completed a course of Pred with no relief. Called in steroid taper not much help  +nasal drip  No fever or discolored mucus  Never smoker. cxr  02/2011 no acute process.   02/11/12- 80 yoF never smoker followed for asthma, allergic rhinitis, cough, complicated by GERD Has been using non- menthol cough drops to help with cough. Would say cough better since last visit.  Tramadol has been some help. Fewer bad coughing spells, noted mostly after meals. We discussed reflux and cough during swallowing. Uses omeprazole and Nexium. Does occasionally feel reflux events. Occasional mild wheeze but no chest pain or shortness of breath. She feels allergy vaccine does well, currently rebuilding.  05/04/12- 80 yoF never smoker followed for asthma, allergic rhinitis, cough, complicated by GERD FOLLOWS FOR: still having slight cough-productive-slight yellow in color; recently sick. Wheezing as well. Flare of hard persistent dry coughing started while on the telephone a few days ago. She didn't recognize it as a reflux event. Cough is now better but not quite back to baseline. She continues allergy vaccine 1:50 [place]  09/01/12 80 yoF never smoker followed for asthma, allergic rhinitis, cough, complicated by GERD FOLLOWS FOR: still on vaccine; sneezing, runny nose x 4 days.  Son had a cold and now she has caught it. Sick x5 days with nasal itching, sneezing. Some chills yesterday but no sore throat or fever. Continues allergy vaccine 1:50 [facility] without problems. CXR 05/05/12- IMPRESSION:  No active cardiopulmonary disease. Chronic change.  Original Report Authenticated By: [name], M.D.  03/06/13- 59 yoF  never smoker followed for asthma, allergic rhinitis, cough, complicated by GERD FOLLOWS LFY:BOFBP well,still gets Allergy vaccine 1:50 [place],had today,tickle in throat,denies cough,sob,wheeze Gets her flu shot from her primary physician. We talked about what to do with allergy vaccine. She gets injections from 2 divided vials and the ragweed side usually itches some.  09/03/13- 81 yoF never smoker followed for asthma, allergic rhinitis, cough, complicated by GERD FOLLOWS FOR: Still on Allergy  Vaccine (A-1:50, B-1:50, [place]) and denies any flare ups at this time. Only occasional minor itch local reaction to allergy shots. Separate injections for each vial.  Occ minor wheeze. Denies need for rescue inhaler. Reflux stable, controlled.   03/11/14- 31 yoF never smoker followed for asthma, allergic rhinitis, cough, complicated by GERD Follows for: Pt denies any complaints at this time.  Still on Allergy  Vaccine (A-1:50, B-1:50, [place])  06/04/14-81 yoF never smoker followed for asthma, allergic rhinitis, cough, complicated by GERD ACUTE VISIT: "cold like symptoms", cough-slightly productive, son told her he hears her wheezing as well.  Had a cold in November never really got over it with some persistent wheezy cough since then. Continues allergy vaccine 1:50GH- has helped.  07/22/2014  82yoF never smoker followed for asthma, allergic rhinitis, cough, complicated by GERD Returns for  2 week follow up for asthma flare. Tx w/ pred taper.  She is feeling better.  Reports breathing is back to baseline except for some residual cough ?reflux Says she still get drainage in her throat. Frequent tickle and throat clearing.  Has increased GERD at night despite PPI daily.  She denies any fever, chest pain, orthopnea, PND or leg swelling  09/09/14- 82yoF never smoker followed for asthma, allergic rhinitis, cough, complicated by GERD FOLLOW FOR:  Allergies.  coughing all the time.     ROS-see HPI Constitutional:    No-   weight loss, night sweats, fevers, chills, fatigue, lassitude. HEENT:   No-  headaches, difficulty swallowing, tooth/dental problems, sore throat,       No-  sneezing, itching, ear ache, nasal congestion, post nasal drip,  CV:  No-   chest pain, orthopnea, PND, swelling in lower extremities, anasarca,  dizziness, palpitations Resp: No-   shortness of breath with exertion or at rest.              No-   productive cough,  + non-productive cough,  No- coughing up of blood.              No-   change in color of mucus.  Skin: No-   rash or lesions. GI:  No-   , abdominal pain, nausea, vomiting, GU:  MS:  No-   joint pain or swelling.   Neuro-     nothing unusual Psych:  No- change in mood or affect. No depression or anxiety.  No memory loss.  OBJ- Physical Exam General- Alert, Oriented, Affect-appropriate, Distress- none acute Skin- rash-none, lesions- none, excoriation- none Lymphadenopathy- none Head- atraumatic            Eyes- Gross vision intact, PERRLA, conjunctivae and secretions clear            Ears- Hearing, canals-normal  Nose- Clear, no-Septal dev, mucus, polyps, erosion, perforation             Throat- Mallampati II , mucosa clear , drainage- none, tonsils- atrophic. + Throat clearing. Neck- flexible , trachea midline, no stridor , thyroid nl, carotid no bruit Chest - symmetrical excursion , unlabored           Heart/CV- RRR , no murmur , no gallop  , no rub, nl s1 s2                           - JVD- none , edema- none, stasis changes- none, varices- none           Lung- clear to P&A, wheeze- none, no- cough , dullness-none, rub- none           Chest wall-  Abd-  Br/ Gen/ Rectal- Not done, not indicated Extrem- cyanosis- none, clubbing, none, atrophy- none, strength- nl Neuro- grossly intact to observation

## 2014-09-09 NOTE — Patient Instructions (Addendum)
Script sent for gabapentin/ Neurontin to try for cough  Order- CXR    chronic cough, peripheral edema             Lab- B Nat peptide     Dx  Peripheral edema  Try taking your furosemide 20 or 40 mg per morning as needed to control the leg edema.

## 2014-09-12 ENCOUNTER — Ambulatory Visit: Payer: PRIVATE HEALTH INSURANCE

## 2014-09-17 ENCOUNTER — Telehealth: Payer: Self-pay | Admitting: Internal Medicine

## 2014-09-17 NOTE — Telephone Encounter (Signed)
Pt advised of lab and cxr results per Dr Annamaria Boots.

## 2014-09-17 NOTE — Progress Notes (Signed)
Quick Note:  lmtcb for pt. ______ 

## 2014-09-19 ENCOUNTER — Ambulatory Visit: Payer: PRIVATE HEALTH INSURANCE

## 2014-10-11 ENCOUNTER — Telehealth: Payer: Self-pay | Admitting: Cardiovascular Disease

## 2014-10-11 NOTE — Telephone Encounter (Signed)
Pt c/o swelling: STAT is pt has developed SOB within 24 hours  1. How long have you been experiencing swelling? Past 2 wks  2. Where is the swelling located? Feet and ankles   3.  Are you currently taking a "fluid pill"? Yes, which only seems to help for a short while and then the swelling begins again  4.  Are you currently SOB? Did not specify  5.  Have you traveled recently? did not specify

## 2014-10-11 NOTE — Telephone Encounter (Signed)
Returning your call. °

## 2014-10-11 NOTE — Telephone Encounter (Signed)
Returned call to patient no answer.LMTC. 

## 2014-10-11 NOTE — Telephone Encounter (Signed)
Received call back from patient she stated has swelling in both ankles and feet.Stated she has gained 15 lbs within 2 weeks.Stated she is following low salt diet.No sob. Stated she would like to be seen sooner.Appointment scheduled with Lyda Jester PA 10/15/14 at 3:15 pm.

## 2014-10-15 ENCOUNTER — Other Ambulatory Visit: Payer: Self-pay | Admitting: *Deleted

## 2014-10-15 ENCOUNTER — Ambulatory Visit (INDEPENDENT_AMBULATORY_CARE_PROVIDER_SITE_OTHER): Payer: Medicare Other | Admitting: Cardiology

## 2014-10-15 ENCOUNTER — Encounter: Payer: Self-pay | Admitting: Cardiology

## 2014-10-15 VITALS — BP 130/62 | HR 70 | Ht <= 58 in | Wt 162.0 lb

## 2014-10-15 DIAGNOSIS — D689 Coagulation defect, unspecified: Secondary | ICD-10-CM | POA: Diagnosis not present

## 2014-10-15 DIAGNOSIS — R531 Weakness: Secondary | ICD-10-CM

## 2014-10-15 DIAGNOSIS — R0600 Dyspnea, unspecified: Secondary | ICD-10-CM

## 2014-10-15 LAB — BASIC METABOLIC PANEL
BUN: 20 mg/dL (ref 6–23)
CHLORIDE: 99 meq/L (ref 96–112)
CO2: 28 mEq/L (ref 19–32)
Calcium: 8.9 mg/dL (ref 8.4–10.5)
Creat: 0.83 mg/dL (ref 0.50–1.10)
GLUCOSE: 107 mg/dL — AB (ref 70–99)
Potassium: 3.9 mEq/L (ref 3.5–5.3)
Sodium: 136 mEq/L (ref 135–145)

## 2014-10-15 NOTE — Patient Instructions (Signed)
Your physician recommends that you schedule a follow-up appointment in: June with Dr. Claiborne Billings  We have ordered labs for you to get done  We have ordered an Echo for you to get done

## 2014-10-15 NOTE — Progress Notes (Signed)
10/15/2014 [name]   [name] 02, 1933  [id number]  Primary Physician Limmie Patricia, MD Primary Cardiologist: Dr. Claiborne Billings Dr. Sallyanne Kuster  Reason for Visit/CC: Bilateral LEE  HPI:  [name] is a 81 y.o. female followed by Dr. Claiborne Billings. Dr. Sallyanne Kuster follows her PPM that was implanted for CHB. She also has a h/o paroxysmal atrial fibrillation, a history of lower extremity edema as well as GERD, hypertension and hyperlipidemia. Her last 2D echo was in 2458 and systolic function was normal. EF was >55%. Mild diastolic dysfunction was noted.  She presents to clinic today with complaints of increased LEE and mild DOE. She denies orthopnea, PND and no chest pain. . She does note mild occasional bilateral leg discomfort but denies erythema, recent prolonged travel and no LE trauma. She usually only takes Lasix as needed, but recently has had to take it more frequently. Her LEE improves some with Lasix. She was recently seen by Dr. Halford Chessman for dyspnea and he ordered a BNP that was slightly elevated at 101. He felt that her symptoms were mostly due to a cardiac etiology. She denies recent dietary indiscretion.     Current Outpatient Prescriptions  Medication Sig Dispense Refill  . aspirin 81 MG tablet Take 81 mg by mouth daily.      . cholecalciferol (VITAMIN D) 1000 UNITS tablet Take 1,000 Units by mouth daily.      Marland Kitchen DILT-XR 180 MG 24 hr capsule TAKE ONE CAPSULE BY MOUTH ONE TIME DAILY  30 capsule 9  . furosemide (LASIX) 20 MG tablet Take 1 tablet (20 mg total) by mouth daily as needed. 30 tablet 3  . LORazepam (ATIVAN) 0.5 MG tablet Take 0.5 mg by mouth 2 (two) times daily as needed.      . metoprolol succinate (TOPROL-XL) 50 MG 24 hr tablet TAKE ONE TABLET BY MOUTH TWICE DAILY  60 tablet 11  . Multiple Vitamin (MULTIVITAMIN) tablet Take 1 tablet by mouth daily.      . pantoprazole (PROTONIX) 40 MG tablet Take 1 tablet by mouth daily before breakfast.    . simvastatin (ZOCOR) 20 MG tablet Take 10  mg by mouth at bedtime.     . valsartan-hydrochlorothiazide (DIOVAN-HCT) 160-12.5 MG per tablet Take 1 tablet by mouth daily. 90 tablet 3   No current facility-administered medications for this visit.    Allergies  Allergen Reactions  . Erythromycin Nausea And Vomiting and Other (See Comments)    Stomach cramps  . Fluticasone-Salmeterol   . Sulfonamide Derivatives     History   Social History  . Marital Status: Married    Spouse Name: N/A  . Number of Children: N/A  . Years of Education: N/A   Occupational History  .      Retired Sales promotion account executive at [employer]   Social History Main Topics  . Smoking status: Never Smoker   . Smokeless tobacco: Never Used  . Alcohol Use: No  . Drug Use: No  . Sexual Activity: Not on file   Other Topics Concern  . Not on file   Social History Narrative     Review of Systems: General: negative for chills, fever, night sweats or weight changes.  Cardiovascular: negative for chest pain, dyspnea on exertion, edema, orthopnea, palpitations, paroxysmal nocturnal dyspnea or shortness of breath Dermatological: negative for rash Respiratory: negative for cough or wheezing Urologic: negative for hematuria Abdominal: negative for nausea, vomiting, diarrhea, bright red blood per rectum, melena, or hematemesis Neurologic: negative for visual changes, syncope,  or dizziness All other systems reviewed and are otherwise negative except as noted above.    Blood pressure 130/62, pulse 70, height 4\' 10"  (1.473 m), weight 162 lb (73.483 kg).  General appearance: alert, cooperative and no distress Neck: no carotid bruit and no JVD Lungs: clear to auscultation bilaterally Heart: regular rate and rhythm, S1, S2 normal, no murmur, click, rub or gallop Extremities: 1+ bilateral LEE Pulses: 2+ and symmetric Skin: warm and dry Neurologic: Grossly normal  EKG Not performed  ASSESSMENT AND PLAN:   1. Presumed Acute on Chronic Diastolic CHF:  Recent BNP was mildly elevated at 101. Also with increased DOE and LEE. Patient instructed to increase Lasix to daily use, 40 mg. We will check a BMP to assess both renal function and electrolytes. Recheck in 1 week after starting daily lasix. We discussed importance of a low sodium diet. Continue BP meds. We will check a 2D echo to reasses LV function and wall motion.   2. LEE + DOE: high suspicion etiology is 2/2 problem #1. However, will check a d-dimer to r/o DVT/PE. If normal, continue w/u for CHF. If abnormal, will continue with imaging studies.   3. HTN: well controlled on current regimen.   PLAN  Keep f/u with Dr. Claiborne Billings on 11/21/14. If 2D echo is significantly abnormal with new reduction in LVF or if with new WMA, will arrange f/u sooner.   SIMMONS, BRITTAINYPA-C 10/15/2014 5:25 PM

## 2014-10-16 ENCOUNTER — Telehealth: Payer: Self-pay | Admitting: Cardiology

## 2014-10-16 ENCOUNTER — Encounter (HOSPITAL_COMMUNITY): Payer: Self-pay | Admitting: Emergency Medicine

## 2014-10-16 ENCOUNTER — Emergency Department (HOSPITAL_COMMUNITY)
Admission: EM | Admit: 2014-10-16 | Discharge: 2014-10-17 | Disposition: A | Discharge status: Home / Self Care | Payer: Medicare Other | Attending: Emergency Medicine | Admitting: Emergency Medicine

## 2014-10-16 DIAGNOSIS — R0602 Shortness of breath: Secondary | ICD-10-CM

## 2014-10-16 DIAGNOSIS — J9811 Atelectasis: Secondary | ICD-10-CM | POA: Diagnosis not present

## 2014-10-16 DIAGNOSIS — R2243 Localized swelling, mass and lump, lower limb, bilateral: Secondary | ICD-10-CM | POA: Diagnosis not present

## 2014-10-16 DIAGNOSIS — E785 Hyperlipidemia, unspecified: Secondary | ICD-10-CM | POA: Diagnosis not present

## 2014-10-16 DIAGNOSIS — J45909 Unspecified asthma, uncomplicated: Secondary | ICD-10-CM | POA: Diagnosis not present

## 2014-10-16 DIAGNOSIS — M7989 Other specified soft tissue disorders: Secondary | ICD-10-CM

## 2014-10-16 DIAGNOSIS — K219 Gastro-esophageal reflux disease without esophagitis: Secondary | ICD-10-CM | POA: Diagnosis not present

## 2014-10-16 DIAGNOSIS — I10 Essential (primary) hypertension: Secondary | ICD-10-CM | POA: Insufficient documentation

## 2014-10-16 DIAGNOSIS — Z79899 Other long term (current) drug therapy: Secondary | ICD-10-CM | POA: Diagnosis not present

## 2014-10-16 DIAGNOSIS — Z7982 Long term (current) use of aspirin: Secondary | ICD-10-CM | POA: Insufficient documentation

## 2014-10-16 DIAGNOSIS — Z950 Presence of cardiac pacemaker: Secondary | ICD-10-CM | POA: Diagnosis not present

## 2014-10-16 LAB — COMPREHENSIVE METABOLIC PANEL
ALT: 21 U/L (ref 0–35)
AST: 24 U/L (ref 0–37)
Albumin: 3.6 g/dL (ref 3.5–5.2)
Alkaline Phosphatase: 72 U/L (ref 39–117)
Anion gap: 9 (ref 5–15)
BUN: 18 mg/dL (ref 6–23)
CALCIUM: 9 mg/dL (ref 8.4–10.5)
CO2: 28 mmol/L (ref 19–32)
Chloride: 99 mmol/L (ref 96–112)
Creatinine, Ser: 1.14 mg/dL — ABNORMAL HIGH (ref 0.50–1.10)
GFR calc non Af Amer: 44 mL/min — ABNORMAL LOW (ref >90)
GFR, EST AFRICAN AMERICAN: 51 mL/min — AB (ref >90)
GLUCOSE: 120 mg/dL — AB (ref 70–99)
Potassium: 3.7 mmol/L (ref 3.5–5.1)
SODIUM: 136 mmol/L (ref 135–145)
Total Bilirubin: 0.3 mg/dL (ref 0.3–1.2)
Total Protein: 6.6 g/dL (ref 6.0–8.3)

## 2014-10-16 LAB — CBC WITH DIFFERENTIAL/PLATELET
BASOS ABS: 0 10*3/uL (ref 0.0–0.1)
Basophils Relative: 1 % (ref 0–1)
EOS ABS: 0.3 10*3/uL (ref 0.0–0.7)
Eosinophils Relative: 5 % (ref 0–5)
HCT: 33.3 % — ABNORMAL LOW (ref 36.0–46.0)
Hemoglobin: 11 g/dL — ABNORMAL LOW (ref 12.0–15.0)
Lymphocytes Relative: 31 % (ref 12–46)
Lymphs Abs: 2.3 10*3/uL (ref 0.7–4.0)
MCH: 30.1 pg (ref 26.0–34.0)
MCHC: 33 g/dL (ref 30.0–36.0)
MCV: 91 fL (ref 78.0–100.0)
Monocytes Absolute: 0.7 10*3/uL (ref 0.1–1.0)
Monocytes Relative: 10 % (ref 3–12)
Neutro Abs: 3.9 10*3/uL (ref 1.7–7.7)
Neutrophils Relative %: 53 % (ref 43–77)
PLATELETS: 216 10*3/uL (ref 150–400)
RBC: 3.66 MIL/uL — ABNORMAL LOW (ref 3.87–5.11)
RDW: 12.3 % (ref 11.5–15.5)
WBC: 7.3 10*3/uL (ref 4.0–10.5)

## 2014-10-16 LAB — D-DIMER, QUANTITATIVE: D-Dimer, Quant: 0.83 ug/mL-FEU — ABNORMAL HIGH (ref 0.00–0.48)

## 2014-10-16 MED ORDER — SODIUM CHLORIDE 0.9 % IV BOLUS (SEPSIS)
500.0000 mL | Freq: Once | INTRAVENOUS | Status: AC
  Administered 2014-10-16: 500 mL via INTRAVENOUS

## 2014-10-16 NOTE — ED Notes (Signed)
Pt. reports intermittent bilateral lower leg swelling for several months , denies injury , ambulatory , seen at Dr. Gretta Arab office yesterday advised to go to ER for doppler studies and CT scan .

## 2014-10-16 NOTE — ED Provider Notes (Signed)
CSN: [id number]     Arrival date & time 10/16/14  1925 History   First MD Initiated Contact with Patient 10/16/14 2304     This chart was scribed for [name] Balls, MD by Forrestine Him, ED Scribe. This patient was seen in room A09C/A09C and the patient's care was started 11:14 PM.   Chief Complaint  Patient presents with  . Leg Swelling   HPI   HPI Comments: [name] is a 81 y.o. female with a PMHx of HTN, hyperlipidemia, PAF, and GERD who presents to the Emergency Department complaining of intermittent, ongoing, unchanged bilateral leg swelling x 2 months. No recent injury or trauma. [name] states she is taking her prescribed  fluid pills as prescribed and directed. Pt was seen by Dr. Gretta Arab yesterday and was advised to come to ED for doppler study and CT scan to rule out any possible blood clots. No recent fever, chills, chest pain, or SOB. Pt with known allergies to Erythromycin, Fluticasone-salmeterol, and Sulfonamide derivatives.  Past Medical History  Diagnosis Date  . Esophageal reflux   . Allergic rhinitis   . Asthmatic bronchitis   . Chronic cough   . Hypertension   . Hyperlipemia   . PAF (paroxysmal atrial fibrillation)   . GERD (gastroesophageal reflux disease)   . Hx of echocardiogram 11/05/2010    EF>55% showed a normal systolic function with impaired diastolic dysfuntion and also tissue doppler suggesting increased elevated left atrial pressure, she had moderate LA dilatation and mild LA dilatation. There was calcified mitral apparatus with moderate MR, mild to moderate TR and trace aortic insufficiency.  . History of stress test 11/20/2009    This was essentially normal,post-stress EF was hyperdynamic at 73 beats per mins, There was no scar or ischemia.   Past Surgical History  Procedure Laterality Date  . Pacemaker insertion  June 1st 2009    [employer] Jude [place] device, model #5826, serial [id number], The artial lead is a Interior and spatial designer, the ventricular lead is Product manager.  Marland Kitchen Foot surgery      right  . Thyroid surgery    . Vesicovaginal fistula closure w/ tah    . Cholecystectomy    . Tonsillectomy    . Nasal septum surgery     Family History  Problem Relation Age of Onset  . Stroke Mother   . Cancer Father     bladder  . Stroke Sister    History  Substance Use Topics  . Smoking status: Never Smoker   . Smokeless tobacco: Never Used  . Alcohol Use: No   OB History    No data available     Review of Systems  Constitutional: Negative for fever and chills.  HENT: Negative for congestion.   Respiratory: Negative for shortness of breath.   Cardiovascular: Positive for leg swelling. Negative for chest pain.  Gastrointestinal: Negative for nausea and vomiting.  Skin: Negative for rash.  Neurological: Negative for weakness.  Psychiatric/Behavioral: Negative for confusion.      Allergies  Erythromycin; Fluticasone-salmeterol; and Sulfonamide derivatives  Home Medications   Prior to Admission medications   Medication Sig Start Date End Date Taking? Authorizing Provider  aspirin 81 MG tablet Take 81 mg by mouth daily.      Historical Provider, MD  cholecalciferol (VITAMIN D) 1000 UNITS tablet Take 1,000 Units by mouth daily.      Historical Provider, MD  DILT-XR 180 MG 24 hr capsule TAKE ONE CAPSULE BY MOUTH ONE TIME  DAILY  04/22/14   Troy Sine, MD  furosemide (LASIX) 20 MG tablet Take 1 tablet (20 mg total) by mouth daily as needed. 08/19/14   Mihai Croitoru, MD  LORazepam (ATIVAN) 0.5 MG tablet Take 0.5 mg by mouth 2 (two) times daily as needed.      Historical Provider, MD  metoprolol succinate (TOPROL-XL) 50 MG 24 hr tablet TAKE ONE TABLET BY MOUTH TWICE DAILY  06/24/14   Mihai Croitoru, MD  Multiple Vitamin (MULTIVITAMIN) tablet Take 1 tablet by mouth daily.      Historical Provider, MD  pantoprazole (PROTONIX) 40 MG tablet Take 1 tablet by mouth daily before breakfast. 07/08/14   Historical Provider, MD  simvastatin (ZOCOR) 20  MG tablet Take 10 mg by mouth at bedtime.     Historical Provider, MD  valsartan-hydrochlorothiazide (DIOVAN-HCT) 160-12.5 MG per tablet Take 1 tablet by mouth daily. 11/27/13   Mihai Croitoru, MD   Triage Vitals: BP 142/52 mmHg  Pulse 71  Temp(Src) 98.3 F (36.8 C)  Resp 16  Ht 4\' 10"  (1.473 m)  Wt 161 lb (73.029 kg)  BMI 33.66 kg/m2  SpO2 94%   Physical Exam  Constitutional: She is oriented to person, place, and time. She appears well-developed and well-nourished. No distress.  HENT:  Head: Normocephalic and atraumatic.  Nose: Nose normal.  Mouth/Throat: Oropharynx is clear and moist. No oropharyngeal exudate.  Eyes: Conjunctivae and EOM are normal. Pupils are equal, round, and reactive to light. No scleral icterus.  Neck: Normal range of motion. Neck supple. No JVD present. No tracheal deviation present. No thyromegaly present.  Cardiovascular: Normal rate, regular rhythm and normal heart sounds.  Exam reveals no gallop and no friction rub.   No murmur heard. Pulmonary/Chest: Effort normal and breath sounds normal. No respiratory distress. She has no wheezes. She exhibits no tenderness.  Abdominal: Soft. Bowel sounds are normal. She exhibits no distension and no mass. There is no tenderness. There is no rebound and no guarding.  Musculoskeletal: Normal range of motion. She exhibits edema and tenderness.  Bilateral non pitting edema noted Calf tenderness on the L  Lymphadenopathy:    She has no cervical adenopathy.  Neurological: She is alert and oriented to person, place, and time. No cranial nerve deficit. She exhibits normal muscle tone.  Skin: Skin is warm and dry. No rash noted. No erythema. No pallor.  Psychiatric: She has a normal mood and affect. Judgment normal.  Nursing note and vitals reviewed.   ED Course  Procedures (including critical care time)  DIAGNOSTIC STUDIES: Oxygen Saturation is 94% on RA, adequate by my interpretation.    COORDINATION OF CARE: 11:13  PM- Will order CBC, CMP, and CT angio chest PE w/CM and or without CM. Discussed treatment plan with pt at bedside and pt agreed to plan.  a   Labs Review Labs Reviewed  CBC WITH DIFFERENTIAL/PLATELET - Abnormal; Notable for the following:    RBC 3.66 (*)    Hemoglobin 11.0 (*)    HCT 33.3 (*)    All other components within normal limits  COMPREHENSIVE METABOLIC PANEL - Abnormal; Notable for the following:    Glucose, Bld 120 (*)    Creatinine, Ser 1.14 (*)    GFR calc non Af Amer 44 (*)    GFR calc Af Amer 51 (*)    All other components within normal limits  PROTIME-INR    Imaging Review Ct Angio Chest Pe W/cm &/or Wo Cm  10/17/2014  CLINICAL DATA:  Bilateral lower leg swelling for several months. Shortness of breath. Initial encounter.  EXAM: CT ANGIOGRAPHY CHEST WITH CONTRAST  TECHNIQUE: Multidetector CT imaging of the chest was performed using the standard protocol during bolus administration of intravenous contrast. Multiplanar CT image reconstructions and MIPs were obtained to evaluate the vascular anatomy.  CONTRAST:  88mL OMNIPAQUE IOHEXOL 350 MG/ML SOLN  COMPARISON:  Mid chest radiograph performed 09/09/2014, and CT of the chest performed 06/03/2005  FINDINGS: There is no evidence of pulmonary embolus.  A tiny 3 mm nodule near the left lung apex is stable from 2006 and likely benign. Minimal bilateral atelectasis is noted. The lungs are otherwise clear. There is no evidence of significant focal consolidation, pleural effusion or pneumothorax. No masses are identified; no abnormal focal contrast enhancement is seen.  Scattered mediastinal nodes remain normal in size. No mediastinal lymphadenopathy is seen. No pericardial effusion is identified. Scattered calcification is noted along the aortic arch and proximal great vessels. No axillary lymphadenopathy is seen. The visualized portions of the thyroid gland are unremarkable in appearance.  The visualized portions of the liver and spleen  are unremarkable. Clips are noted at the gallbladder fossa. The visualized portions of the pancreas and adrenal glands are within normal limits. A 3.7 cm right renal cyst is noted. Scattered smaller left renal cysts are seen.  No acute osseous abnormalities are seen.  Review of the MIP images confirms the above findings.  IMPRESSION: 1. No evidence of pulmonary embolus. 2. Minimal bilateral atelectasis noted; lungs otherwise clear. 3. Scattered bilateral renal cysts seen.   Electronically Signed   By: Garald Balding M.D.   On: 10/17/2014 01:01     EKG Interpretation None      MDM   Final diagnoses:  SOB (shortness of breath)    Patient presents emergency department for bilateral lower leg swelling for the past several weeks. She was seen by Dr. Gretta Arab in the office to do a d-dimer which was positive. There are concern for a blood clot in the chest and legs. Patient does have bilateral lower extremity swelling however it is nonpitting. My suspicion for DVT is low however patient was sent here for imaging. CT Angio be performed.  Patient is currently in no acute distress resting comfortably in the room.  CT angiography reveals no evidence of pulmonary embolism. Patient's physician was concerned of swelling in both legs and positive d-dimer. She'll be covered with Lovenox in the emergency department and instructed to return to radiology department tomorrow morning at 8 AM for Doppler ultrasound. Patient demonstrated understanding to this plan, her vital signs remain within her normal limits and she is safe for discharge.   I personally performed the services described in this documentation, which was scribed in my presence. The recorded information has been reviewed and is accurate.   [name] Balls, MD 10/17/14 [phone]

## 2014-10-16 NOTE — Telephone Encounter (Signed)
  Patient was evaluated in clinic yesterday for dyspnea and bilateral LEE. D/D included worsening CHF vs. PE/DVT. D-dimer returned elevated at 0.84. Patient was notified to go to ED for CT to r/o PE and bilateral dopplers to r/o DVT. She verbalized understanding.   Patrici Minnis, Silas Flood 10/16/2014

## 2014-10-17 ENCOUNTER — Encounter (HOSPITAL_COMMUNITY): Payer: Self-pay

## 2014-10-17 ENCOUNTER — Emergency Department (HOSPITAL_COMMUNITY): Payer: Medicare Other

## 2014-10-17 ENCOUNTER — Emergency Department (HOSPITAL_COMMUNITY)
Admission: RE | Admit: 2014-10-17 | Discharge: 2014-10-17 | Disposition: A | Discharge status: Home / Self Care | Payer: Medicare Other | Source: Ambulatory Visit | Attending: Emergency Medicine | Admitting: Emergency Medicine

## 2014-10-17 DIAGNOSIS — M7989 Other specified soft tissue disorders: Secondary | ICD-10-CM | POA: Diagnosis not present

## 2014-10-17 DIAGNOSIS — J9811 Atelectasis: Secondary | ICD-10-CM | POA: Diagnosis not present

## 2014-10-17 DIAGNOSIS — R2243 Localized swelling, mass and lump, lower limb, bilateral: Secondary | ICD-10-CM | POA: Diagnosis not present

## 2014-10-17 LAB — PROTIME-INR
INR: 1.03 (ref 0.00–1.49)
PROTHROMBIN TIME: 13.7 s (ref 11.6–15.2)

## 2014-10-17 MED ORDER — ENOXAPARIN SODIUM 80 MG/0.8ML ~~LOC~~ SOLN
1.0000 mg/kg | Freq: Once | SUBCUTANEOUS | Status: AC
  Administered 2014-10-17: 75 mg via SUBCUTANEOUS
  Filled 2014-10-17: qty 0.8

## 2014-10-17 MED ORDER — IOHEXOL 350 MG/ML SOLN
80.0000 mL | Freq: Once | INTRAVENOUS | Status: AC | PRN
  Administered 2014-10-17: 80 mL via INTRAVENOUS

## 2014-10-17 NOTE — Discharge Instructions (Signed)
Peripheral Edema [name], your CT scan did not show any blood clots. You may still have a blood clot in your legs. Return to the [facility] Radiology department tomorrow at 8 AM for Doppler ultrasound. Come back to the Emergency Department sooner if any symptoms worsen.  Thank you. You have swelling in your legs (peripheral edema). This swelling is due to excess accumulation of salt and water in your body. Edema may be a sign of heart, kidney or liver disease, or a side effect of a medication. It may also be due to problems in the leg veins. Elevating your legs and using special support stockings may be very helpful, if the cause of the swelling is due to poor venous circulation. Avoid long periods of standing, whatever the cause. Treatment of edema depends on identifying the cause. Chips, pretzels, pickles and other salty foods should be avoided. Restricting salt in your diet is almost always needed. Water pills (diuretics) are often used to remove the excess salt and water from your body via urine. These medicines prevent the kidney from reabsorbing sodium. This increases urine flow. Diuretic treatment may also result in lowering of potassium levels in your body. Potassium supplements may be needed if you have to use diuretics daily. Daily weights can help you keep track of your progress in clearing your edema. You should call your caregiver for follow up care as recommended. SEEK IMMEDIATE MEDICAL CARE IF:   You have increased swelling, pain, redness, or heat in your legs.  You develop shortness of breath, especially when lying down.  You develop chest or abdominal pain, weakness, or fainting.  You have a fever. Document Released: 07/15/2004 Document Revised: 08/30/2011 Document Reviewed: 06/25/2009 [facility] Patient Information 2015 [place], [place]. This information is not intended to replace advice given to you by your health care provider. Make sure you discuss any questions you have with  your health care provider.

## 2014-10-17 NOTE — Progress Notes (Signed)
*  PRELIMINARY RESULTS* Vascular Ultrasound Lower extremity venous duplex has been completed.  Preliminary findings: negative for DVT.  Landry Mellow, RDMS, RVT  10/17/2014, 11:28 AM

## 2014-10-17 NOTE — ED Notes (Signed)
Pt given verbal and written instructions regarding bilateral venous duplex study at time of discharge.

## 2014-10-23 ENCOUNTER — Ambulatory Visit (HOSPITAL_COMMUNITY)
Admission: RE | Admit: 2014-10-23 | Discharge: 2014-10-23 | Disposition: A | Discharge status: Home / Self Care | Payer: Medicare Other | Source: Ambulatory Visit | Attending: Internal Medicine | Admitting: Internal Medicine

## 2014-10-23 DIAGNOSIS — I10 Essential (primary) hypertension: Secondary | ICD-10-CM | POA: Diagnosis not present

## 2014-10-23 DIAGNOSIS — R0600 Dyspnea, unspecified: Secondary | ICD-10-CM | POA: Insufficient documentation

## 2014-10-23 DIAGNOSIS — E038 Other specified hypothyroidism: Secondary | ICD-10-CM | POA: Diagnosis not present

## 2014-10-23 DIAGNOSIS — E780 Pure hypercholesterolemia: Secondary | ICD-10-CM | POA: Diagnosis not present

## 2014-10-23 DIAGNOSIS — R7301 Impaired fasting glucose: Secondary | ICD-10-CM | POA: Diagnosis not present

## 2014-10-24 ENCOUNTER — Telehealth: Payer: Self-pay | Admitting: Internal Medicine

## 2014-10-24 ENCOUNTER — Ambulatory Visit (INDEPENDENT_AMBULATORY_CARE_PROVIDER_SITE_OTHER): Payer: Medicare Other

## 2014-10-24 DIAGNOSIS — J309 Allergic rhinitis, unspecified: Secondary | ICD-10-CM | POA: Diagnosis not present

## 2014-10-24 NOTE — Telephone Encounter (Signed)
Called made pt aware of recs. She will go out and get some benadryl to take shortly. Will forward to tammy scott to reduce pt allergy vaccine.

## 2014-10-24 NOTE — Telephone Encounter (Signed)
If she has no antihistamine- like benadryl, Nyquil, ZZquil or any cold and sinus combination med at home, like "Tylenol Cold and Sinus", then maybe she has something to put on skin for rash, like poison ivy med- Caladryl or etc.  If none of these therapies available, then just wait it out. This should clear over next hour or two.  We need to reduce vaccine back to 1:500 0.1 and  build to goal of 0.5 ml 1:500, no higher.  She should plan on getting an antihistamine to hold at home- Benadryl would be good choice

## 2014-10-24 NOTE — Telephone Encounter (Signed)
Spoke with pt, c/o scratchy throat, b/l hand itching, and redness around injection site going down arms.  Pt had allergy injection this afternoon aprox 2:00.  Pt states these s/s began about 1 hour after injection.  Pt had last allergy injection on 08/16/14.  Pt was given 0.1cc of Vial A in R arm and 0.05cc of Vial B in L arm today.  Pt was unable to relay the size of redness on arms.  Denies fever, difficulty swallowing, pain at injection site. Pt has not taken anything for s/s.  I advised pt to take an antihistamine while we waited on CY's recs, she states she does not have any and did not want to leave home to get any.    CY please advise on recs.    Thanks!  Allergies  Allergen Reactions  . Erythromycin Nausea And Vomiting and Other (See Comments)    Stomach cramps  . Fluticasone-Salmeterol Other (See Comments)    unknown  . Sulfonamide Derivatives Other (See Comments)    unknown   Current Outpatient Prescriptions on File Prior to Visit  Medication Sig Dispense Refill  . aspirin 81 MG tablet Take 81 mg by mouth daily.      . cholecalciferol (VITAMIN D) 1000 UNITS tablet Take 1,000 Units by mouth daily.      Marland Kitchen DILT-XR 180 MG 24 hr capsule TAKE ONE CAPSULE BY MOUTH ONE TIME DAILY  30 capsule 9  . furosemide (LASIX) 20 MG tablet Take 1 tablet (20 mg total) by mouth daily as needed. (Patient taking differently: Take 20 mg by mouth daily. ) 30 tablet 3  . LORazepam (ATIVAN) 0.5 MG tablet Take 0.5 mg by mouth 2 (two) times daily as needed for anxiety.     . metoprolol succinate (TOPROL-XL) 50 MG 24 hr tablet TAKE ONE TABLET BY MOUTH TWICE DAILY  60 tablet 11  . Multiple Vitamin (MULTIVITAMIN) tablet Take 1 tablet by mouth daily.      . pantoprazole (PROTONIX) 40 MG tablet Take 1 tablet by mouth daily before breakfast.    . simvastatin (ZOCOR) 20 MG tablet Take 10 mg by mouth at bedtime.     . valsartan-hydrochlorothiazide (DIOVAN-HCT) 160-12.5 MG per tablet Take 1 tablet by mouth daily. 90  tablet 3   No current facility-administered medications on file prior to visit.

## 2014-10-28 ENCOUNTER — Telehealth: Payer: Self-pay | Admitting: Cardiovascular Disease

## 2014-10-28 DIAGNOSIS — E038 Other specified hypothyroidism: Secondary | ICD-10-CM | POA: Diagnosis not present

## 2014-10-28 DIAGNOSIS — R7301 Impaired fasting glucose: Secondary | ICD-10-CM | POA: Diagnosis not present

## 2014-10-28 DIAGNOSIS — E780 Pure hypercholesterolemia: Secondary | ICD-10-CM | POA: Diagnosis not present

## 2014-10-28 DIAGNOSIS — E559 Vitamin D deficiency, unspecified: Secondary | ICD-10-CM | POA: Diagnosis not present

## 2014-10-28 DIAGNOSIS — M8589 Other specified disorders of bone density and structure, multiple sites: Secondary | ICD-10-CM | POA: Diagnosis not present

## 2014-10-28 DIAGNOSIS — I10 Essential (primary) hypertension: Secondary | ICD-10-CM | POA: Diagnosis not present

## 2014-10-28 DIAGNOSIS — R5382 Chronic fatigue, unspecified: Secondary | ICD-10-CM | POA: Diagnosis not present

## 2014-10-28 NOTE — Telephone Encounter (Signed)
I will make up vac. Tomorrow. There will be no charge to the pt. Because she had a reaction and Dr.Young said to build her up on 1:500 starting at 0.1 and going no higher than 0.5 of 1:500.(Q 2 wks.)

## 2014-10-28 NOTE — Telephone Encounter (Signed)
Received records from [facility] for appointment with Dr Claiborne Billings on 11/21/14.  Records given to [facility] (medical records) for Dr Evette Georges schedule on 11/21/14. lp

## 2014-11-07 ENCOUNTER — Ambulatory Visit: Payer: PRIVATE HEALTH INSURANCE

## 2014-11-21 ENCOUNTER — Encounter: Payer: Self-pay | Admitting: Cardiovascular Disease

## 2014-11-21 ENCOUNTER — Ambulatory Visit (INDEPENDENT_AMBULATORY_CARE_PROVIDER_SITE_OTHER): Payer: Medicare Other | Admitting: Cardiovascular Disease

## 2014-11-21 VITALS — BP 128/60 | HR 70 | Ht <= 58 in | Wt 165.7 lb

## 2014-11-21 DIAGNOSIS — R600 Localized edema: Secondary | ICD-10-CM

## 2014-11-21 DIAGNOSIS — I480 Paroxysmal atrial fibrillation: Secondary | ICD-10-CM

## 2014-11-21 DIAGNOSIS — I10 Essential (primary) hypertension: Secondary | ICD-10-CM

## 2014-11-21 DIAGNOSIS — Z950 Presence of cardiac pacemaker: Secondary | ICD-10-CM | POA: Diagnosis not present

## 2014-11-21 MED ORDER — VALSARTAN-HYDROCHLOROTHIAZIDE 160-25 MG PO TABS: 1.0000 | ORAL_TABLET | Freq: Every day | ORAL | Status: DC

## 2014-11-21 NOTE — Patient Instructions (Signed)
Your physician has recommended you make the following change in your medication: the valsartan -hct has been increased to 160-25 mg. A new prescription has been sent to your  Pharmacy to reflect this change.  Your physician wants you to follow-up in: 6 months or sooner if needed. You will receive a reminder letter in the mail two months in advance. If you don't receive a letter, please call our office to schedule the follow-up appointment.

## 2014-11-22 ENCOUNTER — Encounter: Payer: Self-pay | Admitting: Cardiovascular Disease

## 2014-11-22 DIAGNOSIS — R600 Localized edema: Secondary | ICD-10-CM | POA: Insufficient documentation

## 2014-11-22 NOTE — Progress Notes (Signed)
Patient ID: [name], female   DOB: [birth year], 81 y.o.   MRN: [id number]     HPI: [name] is a 81 y.o. female who presents to the office today for a 9 month cardiology evaluation.  [name] iis status post dual-chamber pacemaker implantation for complete heart block. She has a history of paroxysmal atrial fibrillation, a history of lower extremity edema as well as GERD. She also has a history of hypertension as well as hyperlipidemia. Her last pacemaker interrogation was done in December 2015 by Dr. Sallyanne Kuster.  At that time, she had normal pacemaker function.  She essentially was pacing 100% of the time in both the atrium and ventricle.  There were no episodes of mode switch for atrial fibrillation.  Generator longevity was estimated to be approximately 2.5-3 years.  Over the past year, she admits to feeling well.  She admits to experiencing intermittent ankle swelling.  She was seen in April 2016 by Ellen Henri with increasing leg swelling.  At that time, her Lasix dose was increased short term.  She also was referred for follow-up echo Doppler study which was done on 10/23/2014.  This revealed an ejection fraction in the 50-55% range with grade 1 diastolic dysfunction and mild mitral regurgitation.  Over the past year, she has been on valsartan HCT 160/12.5, Toprol, XL 50 mg twice a day, Cardizem  180 mg daily.  She takes Lasix on an as-needed basis.  She denies chest pain.  She admits to significant weight gain of 17 pounds since September.  She has been less active.  She presents now for one-year evaluation.   Past Medical History  Diagnosis Date  . Esophageal reflux   . Allergic rhinitis   . Asthmatic bronchitis   . Chronic cough   . Hypertension   . Hyperlipemia   . PAF (paroxysmal atrial fibrillation)   . GERD (gastroesophageal reflux disease)   . Hx of echocardiogram 11/05/2010    EF>55% showed a normal systolic function with impaired diastolic dysfuntion and also  tissue doppler suggesting increased elevated left atrial pressure, she had moderate LA dilatation and mild LA dilatation. There was calcified mitral apparatus with moderate MR, mild to moderate TR and trace aortic insufficiency.  . History of stress test 11/20/2009    This was essentially normal,post-stress EF was hyperdynamic at 73 beats per mins, There was no scar or ischemia.    Past Surgical History  Procedure Laterality Date  . Pacemaker insertion  June 1st 2009    St Jude Viola device, model #5826, serial [id number], The artial lead is a Interior and spatial designer, the ventricular lead is Environmental education officer.  Marland Kitchen Foot surgery      right  . Thyroid surgery    . Vesicovaginal fistula closure w/ tah    . Cholecystectomy    . Tonsillectomy    . Nasal septum surgery      Allergies  Allergen Reactions  . Erythromycin Nausea And Vomiting and Other (See Comments)    Stomach cramps  . Fluticasone-Salmeterol Other (See Comments)    unknown  . Sulfonamide Derivatives Other (See Comments)    unknown    Current Outpatient Prescriptions  Medication Sig Dispense Refill  . aspirin 81 MG tablet Take 81 mg by mouth daily.      . cholecalciferol (VITAMIN D) 1000 UNITS tablet Take 1,000 Units by mouth daily.      Marland Kitchen DILT-XR 180 MG 24 hr capsule TAKE ONE CAPSULE BY MOUTH ONE TIME DAILY  30 capsule 9  . LORazepam (ATIVAN) 0.5 MG tablet Take 0.5 mg by mouth 2 (two) times daily as needed for anxiety.     . metoprolol succinate (TOPROL-XL) 50 MG 24 hr tablet TAKE ONE TABLET BY MOUTH TWICE DAILY  60 tablet 11  . Multiple Vitamin (MULTIVITAMIN) tablet Take 1 tablet by mouth daily.      . pantoprazole (PROTONIX) 40 MG tablet Take 1 tablet by mouth daily before breakfast.    . simvastatin (ZOCOR) 20 MG tablet Take 10 mg by mouth at bedtime.     Marland Kitchen SYNTHROID 25 MCG tablet Take 1 tablet by mouth daily.  12  . furosemide (LASIX) 20 MG tablet Take 1 tablet (20 mg total) by mouth daily as needed. (Patient not taking: Reported  on 11/21/2014) 30 tablet 3  . valsartan-hydrochlorothiazide (DIOVAN-HCT) 160-25 MG per tablet Take 1 tablet by mouth daily. 30 tablet 6   No current facility-administered medications for this visit.    History   Social History  . Marital Status: Married    Spouse Name: N/A  . Number of Children: N/A  . Years of Education: N/A   Occupational History  .      Retired Sales promotion account executive at [employer]   Social History Main Topics  . Smoking status: Never Smoker   . Smokeless tobacco: Never Used  . Alcohol Use: No  . Drug Use: No  . Sexual Activity: Not on file   Other Topics Concern  . Not on file   Social History Narrative    Family History  Problem Relation Age of Onset  . Stroke Mother   . Cancer Father     bladder  . Stroke Sister    Additional social history is notable that she is widowed and has one daughter and 2 sons.  ROS General: Negative; No fevers, chills, or night sweats;  HEENT: Negative; No changes in vision or hearing, sinus congestion, difficulty swallowing Pulmonary: Negative; No cough, wheezing, shortness of breath, hemoptysis Cardiovascular: Negative; No chest pain, presyncope, syncope, palpitations.  Positive for leg swelling GI: Positive for GERD, controlled with Nexium; No nausea, vomiting, diarrhea, or abdominal pain GU: Negative; No dysuria, hematuria, or difficulty voiding Musculoskeletal: Negative; no myalgias, joint pain, or weakness Hematologic/Oncology: Negative; no easy bruising, bleeding Endocrine: Negative; no heat/cold intolerance; no diabetes Neuro: Negative; no changes in balance, headaches Skin: Negative; No rashes or skin lesions Psychiatric: Negative; No behavioral problems, depression Sleep: Negative; No snoring, daytime sleepiness, hypersomnolence, bruxism, restless legs, hypnogognic hallucinations, no cataplexy Other comprehensive 14 point system review is negative   PE BP 128/60 mmHg  Pulse 70  Ht _0  (1.473 m)   Wt 75.161 kg (165 lb 11.2 oz)  BMI 34.64 kg/m2  Wt Readings from Last 3 Encounters:  11/21/14 75.161 kg (165 lb 11.2 oz)  10/16/14 73.029 kg (161 lb)  10/15/14 73.483 kg (162 lb)   General: Alert, oriented, no distress.  Skin: normal turgor, no rashes HEENT: Normocephalic, atraumatic. Pupils round and reactive; sclera anicteric;no lid lag.  Nose without nasal septal hypertrophy Mouth/Parynx benign; Mallinpatti scale 2 Neck: No JVD, no carotid bruits with normal carotid upstroke Lungs: clear to ausculatation and percussion; no wheezing or rales Chest wall: Nontender to palpation Heart: RRR, s1 s2 normal 1/6 systolic murmur; no diastolic murmur.  No S3 or S4 gallop.  No rubs, thrills or heaves. Abdomen: Mild central adiposity; soft, nontender; no hepatosplenomehaly, BS+; abdominal aorta nontender and not dilated by palpation. Back: No CVA  Pulses 2+ Extremities: Trace edema;    no clubbing cyanosis, Homan's sign negative  Neurologic: grossly nonfocal; cranial nerves grossly normal Psychological: Normal affect and mood appear normal.  Cognitive function normal.  ECG (independently read by me): AV sequentially paced rhythm with 100% capture.  Ventricular rate 70 beats per minute.  PR interval 224 ms.  August 2014 ECG: 80 paced rhythm at 70 beats per minute. PR interval 222 ms.  LABS: BMP Latest Ref Rng 10/16/2014 10/15/2014 11/28/2007  Glucose 70 - 99 mg/dL 120(H) 107(H) 154(H)  BUN 6 - 23 mg/dL _0 Creatinine 0.50 - 1.10 mg/dL 1.14(H) 0.83 0.96  Sodium 135 - 145 mmol/L 136 136 139  Potassium 3.5 - 5.1 mmol/L 3.7 3.9 3.8  Chloride 96 - 112 mmol/L 99 99 104  CO2 19 - 32 mmol/L _1 Calcium 8.4 - 10.5 mg/dL 9.0 8.9 8.7   Hepatic Function Latest Ref Rng 10/16/2014 11/28/2007 11/24/2007  Total Protein 6.0 - 8.3 g/dL 6.6 5.9(L) 5.8(L)  Albumin 3.5 - 5.2 g/dL 3.6 2.6(L) 2.8(L)  AST 0 - 37 U/L 24 21 77(H)  ALT 0 - 35 U/L 21 29 85(H)  Alk Phosphatase 39 - 117 U/L 72 61 75  Total  Bilirubin 0.3 - 1.2 mg/dL 0.3 0.4 0.4   CBC Latest Ref Rng 10/16/2014 11/28/2007 11/26/2007  WBC 4.0 - 10.5 K/uL 7.3 8.7 10.6(H)  Hemoglobin 12.0 - 15.0 g/dL 11.0(L) 9.7(L) 10.8(L)  Hematocrit 36.0 - 46.0 % 33.3(L) 28.5(L) 31.6(L)  Platelets 150 - 400 K/uL 216 183 170   Lab Results  Component Value Date   MCV 91.0 10/16/2014   MCV 91.2 11/28/2007   MCV 91.3 11/26/2007   Lipid Panel     Component Value Date/Time   CHOL  11/19/2007 0700    153        ATP III CLASSIFICATION:  <200     mg/dL   Desirable  200-239  mg/dL   Borderline High  >=240    mg/dL   High   TRIG 87 11/19/2007 0700   HDL 59 11/19/2007 0700   CHOLHDL 2.6 11/19/2007 0700   VLDL 17 11/19/2007 0700   LDLCALC  11/19/2007 0700    77        Total Cholesterol/HDL:CHD Risk Coronary Heart Disease Risk Table                     Men   Women  1/2 Average Risk   3.4   3.3  RADIOLOGY: No results found.    ASSESSMENT AND PLAN: Ms. Marton is an 81 year old female who has a history of hypertension, PAF, dual chamber pacemaker implantation for complete heart block, and was experiencing intermittent leg edema.  Her blood pressure today is controlled on her medical regimen consisting of valsartan HCT 160/12.5, Toprol-XL 50 mg and diltiazem 180 mg.  However, she is continue to experience episodes of frequent leg swelling.  For this reason, I am changing her valsartan HCT to 160/25 mg for slight increase her diabetic regimen.  She has gained 17 pounds since September.  We discussed the importance of weight loss.  She has not been active and we discussed the importance of increased activity.  Her most recent echo Doppler study from 10/23/2014 was reviewed with her in detail.  This showed an ejection fraction of 50-55% without regional wall motion abnormalities.  There was evidence for grade 1 diastolic dysfunction.  There was mild mitral regurgitation.  We discussed  the importance of reducing sodium intake.  Her ECG shows 100% pacing.  She  is continuing on simvastatin for hyperlipidemia.  She is followed by Dr. Eden Emms who checks her laboratory per I'll try to obtain results of her most recent studies.  She will seeing Dr. Sallyanne Kuster for pacemaker follow-up.  I will see her in 6 months for my cardiology reevaluation.  Troy Sine, MD, [facility]  11/22/2014 8:38 PM

## 2014-12-02 DIAGNOSIS — D3131 Benign neoplasm of right choroid: Secondary | ICD-10-CM | POA: Diagnosis not present

## 2014-12-02 DIAGNOSIS — H3531 Nonexudative age-related macular degeneration: Secondary | ICD-10-CM | POA: Diagnosis not present

## 2014-12-09 ENCOUNTER — Ambulatory Visit (INDEPENDENT_AMBULATORY_CARE_PROVIDER_SITE_OTHER): Payer: Medicare Other

## 2014-12-09 DIAGNOSIS — J309 Allergic rhinitis, unspecified: Secondary | ICD-10-CM

## 2014-12-10 ENCOUNTER — Ambulatory Visit (INDEPENDENT_AMBULATORY_CARE_PROVIDER_SITE_OTHER): Payer: Medicare Other | Admitting: Cardiovascular Disease

## 2014-12-10 ENCOUNTER — Encounter: Payer: Self-pay | Admitting: Cardiovascular Disease

## 2014-12-10 VITALS — BP 120/50 | HR 68 | Ht <= 58 in | Wt 163.0 lb

## 2014-12-10 DIAGNOSIS — I480 Paroxysmal atrial fibrillation: Secondary | ICD-10-CM

## 2014-12-10 DIAGNOSIS — I442 Atrioventricular block, complete: Secondary | ICD-10-CM

## 2014-12-10 DIAGNOSIS — E785 Hyperlipidemia, unspecified: Secondary | ICD-10-CM | POA: Diagnosis not present

## 2014-12-10 DIAGNOSIS — Z950 Presence of cardiac pacemaker: Secondary | ICD-10-CM

## 2014-12-10 LAB — CUP PACEART INCLINIC DEVICE CHECK
Brady Statistic RV Percent Paced: 99 %
Date Time Interrogation Session: 20160621130634
Lead Channel Impedance Value: 501 Ohm
Lead Channel Pacing Threshold Amplitude: 0.5 V
Lead Channel Pacing Threshold Amplitude: 0.625 V
Lead Channel Pacing Threshold Pulse Width: 0.4 ms
Lead Channel Pacing Threshold Pulse Width: 0.5 ms
MDC IDC MSMT BATTERY IMPEDANCE: 3200 Ohm
MDC IDC MSMT BATTERY VOLTAGE: 2.79 V
MDC IDC MSMT LEADCHNL RA IMPEDANCE VALUE: 500 Ohm
MDC IDC MSMT LEADCHNL RA SENSING INTR AMPL: 2.6 mV
MDC IDC SET LEADCHNL RA PACING AMPLITUDE: 2 V
MDC IDC SET LEADCHNL RV PACING PULSEWIDTH: 0.5 ms
MDC IDC SET LEADCHNL RV SENSING SENSITIVITY: 4 mV
MDC IDC STAT BRADY RA PERCENT PACED: 99 % — AB
Pulse Gen Model: 5826
Pulse Gen Serial Number: 2109576

## 2014-12-10 MED ORDER — PRAVASTATIN SODIUM 40 MG PO TABS: 40.0000 mg | ORAL_TABLET | Freq: Every evening | ORAL | Status: DC

## 2014-12-10 NOTE — Patient Instructions (Signed)
Medication Instructions:   STOP simvastatin  START pravastatin 40mg  once daily   Labwork:  Fasting labs in 3 months  Testing/Procedures:  NONE  Follow-Up:  6 month device clinic appointment with Dr. Sallyanne Kuster - [employer] Jude  Any Other Special Instructions Will Be Listed Below (If Applicable).

## 2014-12-10 NOTE — Progress Notes (Signed)
Patient ID: [name], female   DOB: [birth year], 81 y.o.   MRN: [id number]      Cardiology Office Note   Date:  12/10/2014   ID:  [name], DOB 1932/05/28, MRN [id number]  PCP:  Limmie Patricia, MD  Cardiologist:  Shelva Majestic, MD; Sanda Klein, MD   Chief Complaint  Patient presents with  . Follow-up    6 months:  Infreq. chest pain, occas. SOB with activity.  Bilateral lower extremity edema.      History of Present Illness: [name] is a 81 y.o. female who presents for COMPLETE HEART BLOCK/PACEMAKER FOLLOW UP.  She denies palpitations, syncope, significant dyspnea or chest pain. She complains of stiff, sore leg muscles when she gets up each morning or gets out of the car. She occasionally becomes short of breath bending over. She has mild ankle edema.  Her St. Jude Zephyr [id number] dual chamber device is functioning normally and shows an anticipated 2-2.75 years of remaining generator longevity. There is 100% A sensed V paced rhythm and there is no underlying escape rhythm. No atrial or ventricular tachyarrhythmia has occurred since last check.    Past Medical History  Diagnosis Date  . Esophageal reflux   . Allergic rhinitis   . Asthmatic bronchitis   . Chronic cough   . Hypertension   . Hyperlipemia   . PAF (paroxysmal atrial fibrillation)   . GERD (gastroesophageal reflux disease)   . Hx of echocardiogram 11/05/2010    EF>55% showed a normal systolic function with impaired diastolic dysfuntion and also tissue doppler suggesting increased elevated left atrial pressure, she had moderate LA dilatation and mild LA dilatation. There was calcified mitral apparatus with moderate MR, mild to moderate TR and trace aortic insufficiency.  . History of stress test 11/20/2009    This was essentially normal,post-stress EF was hyperdynamic at 73 beats per mins, There was no scar or ischemia.    Past Surgical History  Procedure Laterality Date  . Pacemaker insertion  June  1st 2009    St Jude [place] device, model #5826, serial [id number], The artial lead is a Interior and spatial designer, the ventricular lead is Environmental education officer.  Marland Kitchen Foot surgery      right  . Thyroid surgery    . Vesicovaginal fistula closure w/ tah    . Cholecystectomy    . Tonsillectomy    . Nasal septum surgery       Current Outpatient Prescriptions  Medication Sig Dispense Refill  . aspirin 81 MG tablet Take 81 mg by mouth daily.      . cholecalciferol (VITAMIN D) 1000 UNITS tablet Take 1,000 Units by mouth daily.      Marland Kitchen DILT-XR 180 MG 24 hr capsule TAKE ONE CAPSULE BY MOUTH ONE TIME DAILY  30 capsule 9  . LORazepam (ATIVAN) 0.5 MG tablet Take 0.5 mg by mouth 2 (two) times daily as needed for anxiety.     . metoprolol succinate (TOPROL-XL) 50 MG 24 hr tablet TAKE ONE TABLET BY MOUTH TWICE DAILY  60 tablet 11  . Multiple Vitamin (MULTIVITAMIN) tablet Take 1 tablet by mouth daily.      . pantoprazole (PROTONIX) 40 MG tablet Take 1 tablet by mouth daily before breakfast.    . SYNTHROID 25 MCG tablet Take 1 tablet by mouth daily.  12  . valsartan-hydrochlorothiazide (DIOVAN-HCT) 160-25 MG per tablet Take 1 tablet by mouth daily. 30 tablet 6  . pravastatin (PRAVACHOL) 40 MG tablet Take 1 tablet (40 mg  total) by mouth every evening. 90 tablet 1   No current facility-administered medications for this visit.    Allergies:   Erythromycin; Fluticasone-salmeterol; and Sulfonamide derivatives    Social History:  The patient  reports that she has never smoked. She has never used smokeless tobacco. She reports that she does not drink alcohol or use illicit drugs.   Family History:  The patient's family history includes Cancer in her father; Stroke in her mother and sister.    ROS:  Please see the history of present illness.    Otherwise, review of systems positive for none.   All other systems are reviewed and negative.    PHYSICAL EXAM: VS:  BP 120/50 mmHg  Pulse 68  Ht 4\' 10"  (1.473 m)  Wt 163 lb  (73.936 kg)  BMI 34.08 kg/m2 , BMI Body mass index is 34.08 kg/(m^2).  General: Alert, oriented x3, no distress Head: no evidence of trauma, PERRL, EOMI, no exophtalmos or lid lag, no myxedema, no xanthelasma; normal ears, nose and oropharynx Neck: normal jugular venous pulsations and no hepatojugular reflux; brisk carotid pulses without delay and no carotid bruits Chest: clear to auscultation, no signs of consolidation by percussion or palpation, normal fremitus, symmetrical and full respiratory excursions Cardiovascular: normal position and quality of the apical impulse, regular rhythm, normal first and second heart sounds, no murmurs, rubs or gallops Abdomen: no tenderness or distention, no masses by palpation, no abnormal pulsatility or arterial bruits, normal bowel sounds, no hepatosplenomegaly Extremities: no clubbing, cyanosis or edema; 2+ radial, ulnar and brachial pulses bilaterally; 2+ right femoral, posterior tibial and dorsalis pedis pulses; 2+ left femoral, posterior tibial and dorsalis pedis pulses; no subclavian or femoral bruits Neurological: grossly nonfocal Psych: euthymic mood, full affect   EKG:  EKG is not ordered today.  Recent Labs: 09/09/2014: Pro B Natriuretic peptide (BNP) 101.0* 10/16/2014: ALT 21; BUN 18; Creatinine, Ser 1.14*; Hemoglobin 11.0*; Platelets 216; Potassium 3.7; Sodium 136    Lipid Panel    Component Value Date/Time   CHOL  11/19/2007 0700    153        ATP III CLASSIFICATION:  <200     mg/dL   Desirable  200-239  mg/dL   Borderline High  >=240    mg/dL   High   TRIG 87 11/19/2007 0700   HDL 59 11/19/2007 0700   CHOLHDL 2.6 11/19/2007 0700   VLDL 17 11/19/2007 0700   LDLCALC  11/19/2007 0700    77        Total Cholesterol/HDL:CHD Risk Coronary Heart Disease Risk Table                     Men   Women  1/2 Average Risk   3.4   3.3      Wt Readings from Last 3 Encounters:  12/10/14 163 lb (73.936 kg)  11/21/14 165 lb 11.2 oz (75.161 kg)   10/16/14 161 lb (73.029 kg)      ASSESSMENT AND PLAN:  Pacemaker She has a dual-chamber St. Jude device with normal function. There is 100% ventricular pacing secondary to complete heart block and she is pacemaker dependent.  She has roughly 2.5 years of battery life expectancy at this time. No arrhythmia has been recorded by the device. Lead parameters appear excellent.  HTN (hypertension) Excellent control  Hyperlipidemia  I am not sure whether her muscle complaints reflect statin myopathy or just age related back/joint complaints. There is an adverse interaction between  simvastatin and diltiazem (although this combination has served her well for years). Will switch (at least temporarily) to pravastatin, with lowest risk of adverse interaction, to see if symptoms improve. Mild ankle swelling may also represent a calcium channel blocker side effect  Current medicines are reviewed at length with the patient today.  The patient has concerns regarding medicines.  The following changes have been made:  Replace simvastatin with pravastatin 40 mg each evening and recheck lipids in 3 months.  Labs/ tests ordered today include:  Orders Placed This Encounter  Procedures  . Lipid panel  . Implantable device check   Patient Instructions  Medication Instructions:   STOP simvastatin  START pravastatin 40mg  once daily   Labwork:  Fasting labs in 3 months  Testing/Procedures:  NONE  Follow-Up:  6 month device clinic appointment with Dr. Sallyanne Kuster - [employer] Jude  Any Other Special Instructions Will Be Listed Below (If Applicable).      Mikael Spray, MD  12/10/2014 9:32 PM    Sanda Klein, MD, [facility] [phone] office [phone] pager

## 2014-12-24 ENCOUNTER — Ambulatory Visit (INDEPENDENT_AMBULATORY_CARE_PROVIDER_SITE_OTHER): Payer: Medicare Other

## 2014-12-24 DIAGNOSIS — J309 Allergic rhinitis, unspecified: Secondary | ICD-10-CM | POA: Diagnosis not present

## 2015-01-03 ENCOUNTER — Emergency Department (HOSPITAL_COMMUNITY): Payer: Medicare Other

## 2015-01-03 ENCOUNTER — Encounter (HOSPITAL_COMMUNITY): Payer: Self-pay | Admitting: *Deleted

## 2015-01-03 ENCOUNTER — Emergency Department (HOSPITAL_COMMUNITY)
Admission: EM | Admit: 2015-01-03 | Discharge: 2015-01-03 | Disposition: A | Discharge status: Home / Self Care | Payer: Medicare Other | Attending: Emergency Medicine | Admitting: Emergency Medicine

## 2015-01-03 DIAGNOSIS — R531 Weakness: Secondary | ICD-10-CM | POA: Diagnosis not present

## 2015-01-03 DIAGNOSIS — R404 Transient alteration of awareness: Secondary | ICD-10-CM | POA: Diagnosis not present

## 2015-01-03 DIAGNOSIS — E785 Hyperlipidemia, unspecified: Secondary | ICD-10-CM | POA: Diagnosis not present

## 2015-01-03 DIAGNOSIS — Z950 Presence of cardiac pacemaker: Secondary | ICD-10-CM | POA: Diagnosis not present

## 2015-01-03 DIAGNOSIS — Z041 Encounter for examination and observation following transport accident: Secondary | ICD-10-CM | POA: Insufficient documentation

## 2015-01-03 DIAGNOSIS — S20219A Contusion of unspecified front wall of thorax, initial encounter: Secondary | ICD-10-CM | POA: Diagnosis not present

## 2015-01-03 DIAGNOSIS — K219 Gastro-esophageal reflux disease without esophagitis: Secondary | ICD-10-CM | POA: Diagnosis not present

## 2015-01-03 DIAGNOSIS — I10 Essential (primary) hypertension: Secondary | ICD-10-CM | POA: Insufficient documentation

## 2015-01-03 DIAGNOSIS — Z79899 Other long term (current) drug therapy: Secondary | ICD-10-CM | POA: Diagnosis not present

## 2015-01-03 DIAGNOSIS — Z7982 Long term (current) use of aspirin: Secondary | ICD-10-CM | POA: Insufficient documentation
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
## 2015-01-03 NOTE — Discharge Instructions (Signed)

## 2015-01-06 ENCOUNTER — Ambulatory Visit: Payer: PRIVATE HEALTH INSURANCE

## 2015-01-23 ENCOUNTER — Ambulatory Visit (INDEPENDENT_AMBULATORY_CARE_PROVIDER_SITE_OTHER): Payer: Medicare Other

## 2015-01-23 DIAGNOSIS — J309 Allergic rhinitis, unspecified: Secondary | ICD-10-CM | POA: Diagnosis not present

## 2015-01-28 DIAGNOSIS — E559 Vitamin D deficiency, unspecified: Secondary | ICD-10-CM | POA: Diagnosis not present

## 2015-01-28 DIAGNOSIS — I10 Essential (primary) hypertension: Secondary | ICD-10-CM | POA: Diagnosis not present

## 2015-01-28 DIAGNOSIS — R7301 Impaired fasting glucose: Secondary | ICD-10-CM | POA: Diagnosis not present

## 2015-01-28 DIAGNOSIS — E038 Other specified hypothyroidism: Secondary | ICD-10-CM | POA: Diagnosis not present

## 2015-01-28 DIAGNOSIS — E780 Pure hypercholesterolemia: Secondary | ICD-10-CM | POA: Diagnosis not present

## 2015-01-30 DIAGNOSIS — R5382 Chronic fatigue, unspecified: Secondary | ICD-10-CM | POA: Diagnosis not present

## 2015-01-30 DIAGNOSIS — E780 Pure hypercholesterolemia: Secondary | ICD-10-CM | POA: Diagnosis not present

## 2015-01-30 DIAGNOSIS — R7301 Impaired fasting glucose: Secondary | ICD-10-CM | POA: Diagnosis not present

## 2015-01-30 DIAGNOSIS — E038 Other specified hypothyroidism: Secondary | ICD-10-CM | POA: Diagnosis not present

## 2015-01-30 DIAGNOSIS — M8589 Other specified disorders of bone density and structure, multiple sites: Secondary | ICD-10-CM | POA: Diagnosis not present

## 2015-01-30 DIAGNOSIS — E559 Vitamin D deficiency, unspecified: Secondary | ICD-10-CM | POA: Diagnosis not present

## 2015-01-30 DIAGNOSIS — I10 Essential (primary) hypertension: Secondary | ICD-10-CM | POA: Diagnosis not present

## 2015-02-04 ENCOUNTER — Encounter: Payer: Self-pay | Admitting: Cardiovascular Disease

## 2015-02-06 ENCOUNTER — Ambulatory Visit (INDEPENDENT_AMBULATORY_CARE_PROVIDER_SITE_OTHER): Payer: Medicare Other

## 2015-02-06 DIAGNOSIS — J309 Allergic rhinitis, unspecified: Secondary | ICD-10-CM

## 2015-02-14 ENCOUNTER — Encounter: Payer: Self-pay | Admitting: Internal Medicine

## 2015-02-20 ENCOUNTER — Ambulatory Visit (INDEPENDENT_AMBULATORY_CARE_PROVIDER_SITE_OTHER): Payer: Medicare Other

## 2015-02-20 ENCOUNTER — Other Ambulatory Visit: Payer: Self-pay | Admitting: Cardiovascular Disease

## 2015-02-20 DIAGNOSIS — J309 Allergic rhinitis, unspecified: Secondary | ICD-10-CM

## 2015-02-20 NOTE — Telephone Encounter (Signed)
REFILL 

## 2015-03-06 ENCOUNTER — Ambulatory Visit (INDEPENDENT_AMBULATORY_CARE_PROVIDER_SITE_OTHER): Payer: Medicare Other

## 2015-03-06 DIAGNOSIS — J309 Allergic rhinitis, unspecified: Secondary | ICD-10-CM

## 2015-03-11 ENCOUNTER — Encounter: Payer: Self-pay | Admitting: Internal Medicine

## 2015-03-13 ENCOUNTER — Ambulatory Visit (INDEPENDENT_AMBULATORY_CARE_PROVIDER_SITE_OTHER): Payer: Medicare Other | Admitting: Internal Medicine

## 2015-03-13 ENCOUNTER — Encounter: Payer: Self-pay | Admitting: Internal Medicine

## 2015-03-13 VITALS — BP 136/66 | HR 70 | Ht <= 58 in | Wt 160.0 lb

## 2015-03-13 DIAGNOSIS — Z23 Encounter for immunization: Secondary | ICD-10-CM | POA: Diagnosis not present

## 2015-03-13 DIAGNOSIS — J301 Allergic rhinitis due to pollen: Secondary | ICD-10-CM

## 2015-03-13 DIAGNOSIS — E785 Hyperlipidemia, unspecified: Secondary | ICD-10-CM | POA: Diagnosis not present

## 2015-03-13 DIAGNOSIS — R05 Cough: Secondary | ICD-10-CM | POA: Diagnosis not present

## 2015-03-13 DIAGNOSIS — R059 Cough, unspecified: Secondary | ICD-10-CM

## 2015-03-13 MED ORDER — BENZONATATE 200 MG PO CAPS: 200.0000 mg | ORAL_CAPSULE | Freq: Three times a day (TID) | ORAL | Status: DC | PRN

## 2015-03-13 NOTE — Progress Notes (Signed)
Patient ID: [name], female    DOB: 1932/03/15, 81 y.o.   MRN: [id number]  HPI 02/23/11- 53 yoF never smoker followed for asthma, allergic rhinitis, cough, complicated by GERD Last here August 21, 2010 Holding allergy vaccine at 1:500 to avoid local reactions. Her cough persists with throat tickle. Takes a lot of cough drops. Admits ongoing occasional reflux, which we discussed again. In September we discussed her GI visit with Dr [name] who had done endoscopy and recommended she take 2 Nexium- insurance won't pay. She has 3 cats- all her life. HOB is elevated.  03/09/2011 -79 yoF never smoker followed for asthma, allergic rhinitis, cough, complicated by GERD Acute visit. For 2 weeks she has had a sore throat with increased postnasal drainage worse cough watery nose. She cancelled her methacholine test because of the cough. Nose and ears are itching. We had doubled her GERD medication by adding omeprazole plus Nexium. She does admit some reflux but not heartburn. Allergy profile: 02/23/2011 IgE 14.1 with elevations for ragweed and goldenrod  04/27/11- 79 yoF never smoker followed for asthma, allergic rhinitis, cough, complicated by GERD Has had flu vaccine. Coughing is a little better. She thinks it might be worse on the days she gets her allergy shots and her son has pointed that out. She does not cough at night. Coughs in the evenings, not related to meals. She doesn't often feel herself refluxing while she takes omeprazole and sleeps with the head of her bed elevated. We again discussed the idea of "cyclic cough". She was sick today she was scheduled to have a methacholine inhalation challenge test. We will reschedule if it seems useful.  10/26/11- 79 yoF never smoker followed for asthma, allergic rhinitis, cough, complicated by GERD After concern that allergy shts were associated w/ incvreased cough, she decided she was better off on them and restarted. Now at 1:500. Some throat clearing and  postnasal drip. No overt reflux. HOB is raised. Failed benzonatate.  01/03/12 Acute OV  pt c/o dry cough x 2-3 years with little to no production. pt states she has had some wheezing at night time and some during the day time. Pt also c/o some chest congestion that comes and goes as well as PND.  pt states she completed a course of Pred with no relief. Called in steroid taper not much help  +nasal drip  No fever or discolored mucus  Never smoker. cxr  02/2011 no acute process.   02/11/12- 80 yoF never smoker followed for asthma, allergic rhinitis, cough, complicated by GERD Has been using non- menthol cough drops to help with cough. Would say cough better since last visit.  Tramadol has been some help. Fewer bad coughing spells, noted mostly after meals. We discussed reflux and cough during swallowing. Uses omeprazole and Nexium. Does occasionally feel reflux events. Occasional mild wheeze but no chest pain or shortness of breath. She feels allergy vaccine does well, currently rebuilding.  05/04/12- 80 yoF never smoker followed for asthma, allergic rhinitis, cough, complicated by GERD FOLLOWS FOR: still having slight cough-productive-slight yellow in color; recently sick. Wheezing as well. Flare of hard persistent dry coughing started while on the telephone a few days ago. She didn't recognize it as a reflux event. Cough is now better but not quite back to baseline. She continues allergy vaccine 1:50 [place]  09/01/12 80 yoF never smoker followed for asthma, allergic rhinitis, cough, complicated by GERD FOLLOWS FOR: still on vaccine; sneezing, runny nose x 4 days.  Son had a cold and now she has caught it. Sick x5 days with nasal itching, sneezing. Some chills yesterday but no sore throat or fever. Continues allergy vaccine 1:50 GH without problems. CXR 05/05/12- IMPRESSION:  No active cardiopulmonary disease. Chronic change.  Original Report Authenticated By: [name], M.D.  03/06/13- 29 yoF  never smoker followed for asthma, allergic rhinitis, cough, complicated by GERD FOLLOWS HYI:FOYDX well,still gets Allergy vaccine 1:50 [facility],had today,tickle in throat,denies cough,sob,wheeze Gets her flu shot from her primary physician. We talked about what to do with allergy vaccine. She gets injections from 2 divided vials and the ragweed side usually itches some.  09/03/13- 81 yoF never smoker followed for asthma, allergic rhinitis, cough, complicated by GERD FOLLOWS FOR: Still on Allergy  Vaccine (A-1:50, B-1:50, GH) and denies any flare ups at this time. Only occasional minor itch local reaction to allergy shots. Separate injections for each vial.  Occ minor wheeze. Denies need for rescue inhaler. Reflux stable, controlled.   03/11/14- 46 yoF never smoker followed for asthma, allergic rhinitis, cough, complicated by GERD Follows for: Pt denies any complaints at this time.  Still on Allergy  Vaccine (A-1:50, B-1:50, [place])  06/04/14-81 yoF never smoker followed for asthma, allergic rhinitis, cough, complicated by GERD ACUTE VISIT: "cold like symptoms", cough-slightly productive, son told her he hears her wheezing as well.  Had a cold in November never really got over it with some persistent wheezy cough since then. Continues allergy vaccine 1:50GH- has helped.  07/22/2014  82yoF never smoker followed for asthma, allergic rhinitis, cough, complicated by GERD Returns for  2 week follow up for asthma flare. Tx w/ pred taper.  She is feeling better.  Reports breathing is back to baseline except for some residual cough ?reflux Says she still get drainage in her throat. Frequent tickle and throat clearing.  Has increased GERD at night despite PPI daily.  She denies any fever, chest pain, orthopnea, PND or leg swelling  09/09/14- 82yoF never smoker followed for asthma, allergic rhinitis, cough, complicated by GERD FOLLOW FOR:  Allergies.  coughing all the time.   03/13/15- 83yoF never smoker  followed for asthma, allergic rhinitis, cough, complicated by GERD, CHB/ pacer,  Allergy vaccine 1:50 GH LOV we added gabapentin for cough, suggested lasix 20-40 mg for edema, BNP was 101  FOLLOWS FOR: pt c/o fatigue, prod cough with unknown color.  pt states her cough is baseline.  tolerating allergy vaccines well, does note a cough after getting her injections.  Complains of feeling tired and admits the problem is that she stays up too late. Naps easily during the day. Still occasional dry cough. Less ankle edema. Took gabapentin in the past but doesn't remember it as helpful for cough. CTa chest 10/17/14-I reviewed IMPRESSION: 1. No evidence of pulmonary embolus. 2. Minimal bilateral atelectasis noted; lungs otherwise clear. 3. Scattered bilateral renal cysts seen. Electronically Signed  By: Garald Balding M.D.  On: 10/17/2014 01:01  ROS-see HPI Constitutional:   No-   weight loss, night sweats, fevers, chills, fatigue, lassitude. HEENT:   No-  headaches, difficulty swallowing, tooth/dental problems, sore throat,       No-  sneezing, itching, ear ache, nasal congestion, post nasal drip,  CV:  No-   chest pain, orthopnea, PND, swelling in lower extremities, anasarca,  dizziness, palpitations Resp: No-   shortness of breath with exertion or at rest.              No-   productive  cough,  + non-productive cough,  No- coughing up of blood.              No-   change in color of mucus.  Skin: No-   rash or lesions. GI:  No-   , abdominal pain, nausea, vomiting, GU:  MS:  No-   joint pain or swelling.   Neuro-     nothing unusual Psych:  No- change in mood or affect. No depression or anxiety.  No memory loss.  OBJ- Physical Exam General- Alert, Oriented, Affect-appropriate, Distress- none acute Skin- rash-none, lesions- none, excoriation- none Lymphadenopathy- none Head- atraumatic            Eyes- Gross vision intact, PERRLA, conjunctivae and secretions clear            Ears-  Hearing, canals-normal            Nose- Clear, no-Septal dev, mucus, polyps, erosion, perforation             Throat- Mallampati II , mucosa clear , drainage- none, tonsils- atrophic.  Neck- flexible , trachea midline, no stridor , thyroid nl, carotid no bruit Chest - symmetrical excursion , unlabored           Heart/CV- RRR , no murmur , no gallop  , no rub, nl s1 s2                           - JVD- none , edema- none, stasis changes- none, varices- none           Lung- clear to P&A, wheeze- none, no- cough , dullness-none, rub- none           Chest wall- + right chest pacemaker Abd-  Br/ Gen/ Rectal- Not done, not indicated Extrem- cyanosis- none, clubbing, none, atrophy- none, strength- nl Neuro- grossly intact to observation

## 2015-03-13 NOTE — Patient Instructions (Signed)
We can continue allergy vaccine  Script sent for benzonatate perles to try for cough  Flu vax

## 2015-03-14 LAB — LIPID PANEL
Cholesterol: 136 mg/dL (ref 125–200)
HDL: 57 mg/dL (ref >=46)
LDL CALC: 60 mg/dL (ref <130)
Total CHOL/HDL Ratio: 2.4 Ratio (ref <=5.0)
Triglycerides: 95 mg/dL (ref <150)
VLDL: 19 mg/dL (ref <30)

## 2015-03-15 NOTE — Assessment & Plan Note (Signed)
We again discussed possible contributing factors. She is not recognizing active reflux or postnasal drip Plan-benzonatate Perles

## 2015-03-15 NOTE — Assessment & Plan Note (Signed)
She continues allergy vaccine 1:50 GH

## 2015-03-17 ENCOUNTER — Telehealth: Payer: Self-pay | Admitting: Cardiovascular Disease

## 2015-03-17 NOTE — Telephone Encounter (Signed)
Lab results called to patient.  Voiced understanding.

## 2015-03-17 NOTE — Telephone Encounter (Signed)
Returning your call from Friday. °

## 2015-03-20 ENCOUNTER — Ambulatory Visit (INDEPENDENT_AMBULATORY_CARE_PROVIDER_SITE_OTHER): Payer: Medicare Other

## 2015-03-20 DIAGNOSIS — J309 Allergic rhinitis, unspecified: Secondary | ICD-10-CM | POA: Diagnosis not present

## 2015-04-03 ENCOUNTER — Ambulatory Visit (INDEPENDENT_AMBULATORY_CARE_PROVIDER_SITE_OTHER): Payer: Medicare Other

## 2015-04-03 DIAGNOSIS — J309 Allergic rhinitis, unspecified: Secondary | ICD-10-CM

## 2015-04-17 ENCOUNTER — Ambulatory Visit (INDEPENDENT_AMBULATORY_CARE_PROVIDER_SITE_OTHER): Payer: Medicare Other

## 2015-04-17 DIAGNOSIS — J309 Allergic rhinitis, unspecified: Secondary | ICD-10-CM | POA: Diagnosis not present

## 2015-04-28 ENCOUNTER — Encounter: Payer: Self-pay | Admitting: Internal Medicine

## 2015-04-29 DIAGNOSIS — R7301 Impaired fasting glucose: Secondary | ICD-10-CM | POA: Diagnosis not present

## 2015-04-29 DIAGNOSIS — E038 Other specified hypothyroidism: Secondary | ICD-10-CM | POA: Diagnosis not present

## 2015-04-29 DIAGNOSIS — I10 Essential (primary) hypertension: Secondary | ICD-10-CM | POA: Diagnosis not present

## 2015-04-29 DIAGNOSIS — E7800 Pure hypercholesterolemia, unspecified: Secondary | ICD-10-CM | POA: Diagnosis not present

## 2015-04-29 DIAGNOSIS — E559 Vitamin D deficiency, unspecified: Secondary | ICD-10-CM | POA: Diagnosis not present

## 2015-05-01 ENCOUNTER — Ambulatory Visit (INDEPENDENT_AMBULATORY_CARE_PROVIDER_SITE_OTHER): Payer: Medicare Other

## 2015-05-01 DIAGNOSIS — J309 Allergic rhinitis, unspecified: Secondary | ICD-10-CM

## 2015-05-02 DIAGNOSIS — M8589 Other specified disorders of bone density and structure, multiple sites: Secondary | ICD-10-CM | POA: Diagnosis not present

## 2015-05-02 DIAGNOSIS — R5382 Chronic fatigue, unspecified: Secondary | ICD-10-CM | POA: Diagnosis not present

## 2015-05-02 DIAGNOSIS — E7800 Pure hypercholesterolemia, unspecified: Secondary | ICD-10-CM | POA: Diagnosis not present

## 2015-05-02 DIAGNOSIS — I10 Essential (primary) hypertension: Secondary | ICD-10-CM | POA: Diagnosis not present

## 2015-05-02 DIAGNOSIS — E559 Vitamin D deficiency, unspecified: Secondary | ICD-10-CM | POA: Diagnosis not present

## 2015-05-02 DIAGNOSIS — R7301 Impaired fasting glucose: Secondary | ICD-10-CM | POA: Diagnosis not present

## 2015-05-02 DIAGNOSIS — E038 Other specified hypothyroidism: Secondary | ICD-10-CM | POA: Diagnosis not present

## 2015-05-07 ENCOUNTER — Telehealth: Payer: Self-pay | Admitting: Cardiovascular Disease

## 2015-05-07 NOTE — Telephone Encounter (Signed)
Received records from [facility] for appointment on 05/22/15 with Dr Claiborne Billings.  Records given to [facility] (medical records) for Dr Evette Georges schedule on 05/22/15. lp

## 2015-05-16 ENCOUNTER — Ambulatory Visit: Payer: PRIVATE HEALTH INSURANCE

## 2015-05-22 ENCOUNTER — Ambulatory Visit (INDEPENDENT_AMBULATORY_CARE_PROVIDER_SITE_OTHER): Payer: Medicare Other | Admitting: Cardiovascular Disease

## 2015-05-22 ENCOUNTER — Encounter: Payer: Self-pay | Admitting: Cardiovascular Disease

## 2015-05-22 VITALS — BP 124/58 | HR 70 | Ht <= 58 in | Wt 159.0 lb

## 2015-05-22 DIAGNOSIS — I480 Paroxysmal atrial fibrillation: Secondary | ICD-10-CM

## 2015-05-22 DIAGNOSIS — I10 Essential (primary) hypertension: Secondary | ICD-10-CM

## 2015-05-22 DIAGNOSIS — Z950 Presence of cardiac pacemaker: Secondary | ICD-10-CM

## 2015-05-22 DIAGNOSIS — E785 Hyperlipidemia, unspecified: Secondary | ICD-10-CM

## 2015-05-22 DIAGNOSIS — R600 Localized edema: Secondary | ICD-10-CM | POA: Diagnosis not present

## 2015-05-22 NOTE — Patient Instructions (Signed)
Your physician wants you to follow-up in: 6 months or sooner if needed. You will receive a reminder letter in the mail two months in advance. If you don't receive a letter, please call our office to schedule the follow-up appointment.   If you need a refill on your cardiac medications before your next appointment, please call your pharmacy. 

## 2015-05-22 NOTE — Progress Notes (Signed)
Patient ID: [name], female   DOB: 1932/05/10, 80 y.o.   MRN: [id number]    Primary MD: Dr. Legrand Como Altheimer  HPI: [name] is a 81 y.o. female who presents to the office today for a 6 month cardiology evaluation.  [name] underwent dual-chamber pacemaker implantation for complete heart block. She has a history of paroxysmal atrial fibrillation, a history of lower extremity edema as well as GERD. She also has a history of hypertension as well as hyperlipidemia. Her last pacemaker interrogation was done in June 2016 by Dr. Sallyanne Kuster.  At that time, she had normal pacemaker function.  She  was pacing 100% of the time in both the atrium and ventricle.  There were no episodes of mode switch for atrial fibrillation.  Generator longevity was estimated to be approximately 2.5 years.  During his evaluation, she also complained of some mild muscle complaints.  He elected to change her from simvastatin to pravastatin.  A follow-up echo Doppler study on 10/23/2014 revealed an ejection fraction in the 50-55% range with grade 1 diastolic dysfunction and mild mitral regurgitation.  Since I last saw her, she continues to do well and is without chest pain.  She notes occasional fatigue. She tells me she will be undergoing cataract surgery. She is sleeping well. She denies presyncope or syncope. At times she admits to intermittent leg swelling. She has a history of hypothyroidism on thyroid replacement.She presents for evaluation  Past Medical History  Diagnosis Date  . Esophageal reflux   . Allergic rhinitis   . Asthmatic bronchitis   . Chronic cough   . Hypertension   . Hyperlipemia   . PAF (paroxysmal atrial fibrillation) ([place])   . GERD (gastroesophageal reflux disease)   . Hx of echocardiogram 11/05/2010    EF>55% showed a normal systolic function with impaired diastolic dysfuntion and also tissue doppler suggesting increased elevated left atrial pressure, she had moderate LA dilatation and  mild LA dilatation. There was calcified mitral apparatus with moderate MR, mild to moderate TR and trace aortic insufficiency.  . History of stress test 11/20/2009    This was essentially normal,post-stress EF was hyperdynamic at 73 beats per mins, There was no scar or ischemia.    Past Surgical History  Procedure Laterality Date  . Pacemaker insertion  June 1st 2009    St Jude [place] device, model #5826, serial [id number], The artial lead is a Interior and spatial designer, the ventricular lead is Environmental education officer.  Marland Kitchen Foot surgery      right  . Thyroid surgery    . Vesicovaginal fistula closure w/ tah    . Cholecystectomy    . Tonsillectomy    . Nasal septum surgery      Allergies  Allergen Reactions  . Erythromycin Nausea And Vomiting and Other (See Comments)    Stomach cramps  . Fluticasone-Salmeterol Other (See Comments)    unknown  . Sulfonamide Derivatives Other (See Comments)    unknown    Current Outpatient Prescriptions  Medication Sig Dispense Refill  . aspirin 81 MG tablet Take 81 mg by mouth daily.      . benzonatate (TESSALON) 200 MG capsule Take 1 capsule (200 mg total) by mouth 3 (three) times daily as needed for cough. 30 capsule 5  . cholecalciferol (VITAMIN D) 1000 UNITS tablet Take 1,000 Units by mouth daily.      Marland Kitchen diltiazem (DILACOR XR) 180 MG 24 hr capsule TAKE ONE CAPSULE BY MOUTH ONE TIME DAILY 30 capsule  3  . LORazepam (ATIVAN) 0.5 MG tablet Take 0.5 mg by mouth 2 (two) times daily as needed for anxiety.     . metoprolol succinate (TOPROL-XL) 50 MG 24 hr tablet TAKE ONE TABLET BY MOUTH TWICE DAILY  60 tablet 11  . Multiple Vitamin (MULTIVITAMIN) tablet Take 1 tablet by mouth daily.      . NON FORMULARY Allergy vaccines 1:500 biweekly GH    . pantoprazole (PROTONIX) 40 MG tablet Take 1 tablet by mouth daily before breakfast.    . pravastatin (PRAVACHOL) 40 MG tablet Take 1 tablet (40 mg total) by mouth every evening. 90 tablet 1  . SYNTHROID 25 MCG tablet Take 1 tablet by  mouth daily.  12  . valsartan-hydrochlorothiazide (DIOVAN-HCT) 160-25 MG per tablet Take 1 tablet by mouth daily. 30 tablet 6   No current facility-administered medications for this visit.    Social History   Social History  . Marital Status: Married    Spouse Name: N/A  . Number of Children: N/A  . Years of Education: N/A   Occupational History  .      Retired Sales promotion account executive at [employer]   Social History Main Topics  . Smoking status: Never Smoker   . Smokeless tobacco: Never Used  . Alcohol Use: No  . Drug Use: No  . Sexual Activity: Not on file   Other Topics Concern  . Not on file   Social History Narrative    Family History  Problem Relation Age of Onset  . Stroke Mother   . Cancer Father     bladder  . Stroke Sister    Additional social history is notable that she is widowed and has one daughter and 2 sons.  ROS General: Negative; No fevers, chills, or night sweats;  HEENT: Negative; No changes in vision or hearing, sinus congestion, difficulty swallowing Pulmonary: Negative; No cough, wheezing, shortness of breath, hemoptysis Cardiovascular: Negative; No chest pain, presyncope, syncope, palpitations.  Positive for leg swelling GI: Positive for GERD, controlled with Nexium; No nausea, vomiting, diarrhea, or abdominal pain GU: Negative; No dysuria, hematuria, or difficulty voiding Musculoskeletal: Negative; no myalgias, joint pain, or weakness Hematologic/Oncology: Negative; no easy bruising, bleeding Endocrine: Negative; no heat/cold intolerance; no diabetes Neuro: Negative; no changes in balance, headaches Skin: Negative; No rashes or skin lesions Psychiatric: Negative; No behavioral problems, depression Sleep: Negative; No snoring, daytime sleepiness, hypersomnolence, bruxism, restless legs, hypnogognic hallucinations, no cataplexy Other comprehensive 14 point system review is negative   PE BP 124/58 mmHg  Pulse 70  Ht 4' 10"  (1.473 m)   Wt 159 lb (72.122 kg)  BMI 33.24 kg/m2  Wt Readings from Last 3 Encounters:  05/22/15 159 lb (72.122 kg)  03/13/15 160 lb (72.576 kg)  12/10/14 163 lb (73.936 kg)   General: Alert, oriented, no distress.  Skin: normal turgor, no rashes HEENT: Normocephalic, atraumatic. Pupils round and reactive; sclera anicteric;no lid lag.  Nose without nasal septal hypertrophy Mouth/Parynx benign; Mallinpatti scale 2 Neck: No JVD, no carotid bruits with normal carotid upstroke Lungs: clear to ausculatation and percussion; no wheezing or rales Chest wall: Nontender to palpation Heart: RRR, s1 s2 normal 1/6 systolic murmur; no diastolic murmur.  No S3 or S4 gallop.  No rubs, thrills or heaves. Abdomen: Mild central adiposity; soft, nontender; no hepatosplenomehaly, BS+; abdominal aorta nontender and not dilated by palpation. Back: No CVA Pulses 2+ Extremities: Trace edema;    no clubbing cyanosis, Homan's sign negative  Neurologic: grossly nonfocal;  cranial nerves grossly normal Psychological: Normal affect and mood appear normal.  Cognitive function normal.  ECG (independently read by me):  100% AV pacing with ventricular rate at 70 and prolonged AV conduction at 224 ms.  June 2016ECG (independently read by me): AV sequentially paced rhythm with 100% capture.  Ventricular rate 70 beats per minute.  PR interval 224 ms.  August 2014 ECG: 80 paced rhythm at 70 beats per minute. PR interval 222 ms.  LABS: BMP Latest Ref Rng 10/16/2014 10/15/2014 11/28/2007  Glucose 70 - 99 mg/dL 120(H) 107(H) 154(H)  BUN 6 - 23 mg/dL 18 20 16   Creatinine 0.50 - 1.10 mg/dL 1.14(H) 0.83 0.96  Sodium 135 - 145 mmol/L 136 136 139  Potassium 3.5 - 5.1 mmol/L 3.7 3.9 3.8  Chloride 96 - 112 mmol/L 99 99 104  CO2 19 - 32 mmol/L 28 28 27   Calcium 8.4 - 10.5 mg/dL 9.0 8.9 8.7   Hepatic Function Latest Ref Rng 10/16/2014 11/28/2007 11/24/2007  Total Protein 6.0 - 8.3 g/dL 6.6 5.9(L) 5.8(L)  Albumin 3.5 - 5.2 g/dL 3.6 2.6(L) 2.8(L)   AST 0 - 37 U/L 24 21 77(H)  ALT 0 - 35 U/L 21 29 85(H)  Alk Phosphatase 39 - 117 U/L 72 61 75  Total Bilirubin 0.3 - 1.2 mg/dL 0.3 0.4 0.4   CBC Latest Ref Rng 10/16/2014 11/28/2007 11/26/2007  WBC 4.0 - 10.5 K/uL 7.3 8.7 10.6(H)  Hemoglobin 12.0 - 15.0 g/dL 11.0(L) 9.7(L) 10.8(L)  Hematocrit 36.0 - 46.0 % 33.3(L) 28.5(L) 31.6(L)  Platelets 150 - 400 K/uL 216 183 170   Lab Results  Component Value Date   MCV 91.0 10/16/2014   MCV 91.2 11/28/2007   MCV 91.3 11/26/2007   Lipid Panel     Component Value Date/Time   CHOL 136 03/13/2015 1436   TRIG 95 03/13/2015 1436   HDL 57 03/13/2015 1436   CHOLHDL 2.4 03/13/2015 1436   VLDL 19 03/13/2015 1436   LDLCALC 60 03/13/2015 1436  RADIOLOGY: No results found.    ASSESSMENT AND PLAN: [name] is an 81 year old female who has a history of hypertension, PAF, dual chamber pacemaker implantation for complete heart block, and was experiencing intermittent leg edema.  Her blood pressure today is controlled on her medical regimen consisting of valsartan HCT 160/25, Toprol-XL 50 mg and diltiazem 180 mg.   When I last saw her, she was experiencing more leg edema and I titrated her valsartan HCT to 160/25 mg. Her most recent echo Doppler study from 10/23/2014  menstruatedan ejection fraction of 50-55% without regional wall motion abnormalities.  There was evidence for grade 1 diastolic dysfunction.  There was mild mitral regurgitation.  We discussed the importance of reducing sodium intake.  Her ECG shows 100% pacing.   She denies significant myalgias today and is now on pravastatin, which had replaced  Simvastatin in June.  Follow-up lipid studies in September.  Continue to show excellent result with an LDL of 60.  Total cholesterol 136.  She is not having any anginal symptoms.  There are no signs of CHF.  I've given her clearance to undergo her planned cataract surgery.  She has requested that I see her at six-month intervals and I will see her in 6  months for reevaluation.  Time spent: 25 minutes  Troy Sine, MD, [facility]  05/22/2015 6:54 PM

## 2015-05-29 ENCOUNTER — Ambulatory Visit (INDEPENDENT_AMBULATORY_CARE_PROVIDER_SITE_OTHER): Payer: Medicare Other

## 2015-05-29 DIAGNOSIS — J309 Allergic rhinitis, unspecified: Secondary | ICD-10-CM | POA: Diagnosis not present

## 2015-06-04 DIAGNOSIS — H2511 Age-related nuclear cataract, right eye: Secondary | ICD-10-CM | POA: Diagnosis not present

## 2015-06-04 DIAGNOSIS — Z961 Presence of intraocular lens: Secondary | ICD-10-CM | POA: Diagnosis not present

## 2015-06-04 DIAGNOSIS — D3131 Benign neoplasm of right choroid: Secondary | ICD-10-CM | POA: Diagnosis not present

## 2015-06-04 DIAGNOSIS — H26492 Other secondary cataract, left eye: Secondary | ICD-10-CM | POA: Diagnosis not present

## 2015-06-05 ENCOUNTER — Other Ambulatory Visit: Payer: Self-pay | Admitting: Cardiovascular Disease

## 2015-06-05 NOTE — Telephone Encounter (Signed)
Rx(s) sent to pharmacy electronically.  

## 2015-06-10 ENCOUNTER — Encounter: Payer: Self-pay | Admitting: Cardiovascular Disease

## 2015-06-10 ENCOUNTER — Ambulatory Visit (INDEPENDENT_AMBULATORY_CARE_PROVIDER_SITE_OTHER): Payer: Medicare Other | Admitting: Cardiovascular Disease

## 2015-06-10 VITALS — BP 128/52 | HR 84 | Resp 16 | Ht <= 58 in | Wt 161.3 lb

## 2015-06-10 DIAGNOSIS — E785 Hyperlipidemia, unspecified: Secondary | ICD-10-CM | POA: Diagnosis not present

## 2015-06-10 DIAGNOSIS — I480 Paroxysmal atrial fibrillation: Secondary | ICD-10-CM

## 2015-06-10 DIAGNOSIS — Z950 Presence of cardiac pacemaker: Secondary | ICD-10-CM

## 2015-06-10 DIAGNOSIS — I442 Atrioventricular block, complete: Secondary | ICD-10-CM

## 2015-06-10 NOTE — Patient Instructions (Signed)
Dr. Croitoru recommends that you schedule a follow-up appointment in: 6 MONTHS WITH PACEMAKER CHECK ([facility])   

## 2015-06-10 NOTE — Progress Notes (Signed)
Patient ID: [name], female   DOB: 25-Sep-1931, 81 y.o.   MRN: [id number]    Cardiology Office Note    Date:  06/12/2015   ID:  [name], DOB 02/21/1932, MRN [id number]  PCP:  Limmie Patricia, MD  Cardiologist:   Sanda Klein, MD   Chief Complaint  Patient presents with  . Follow-up    occassional chest pain, occassional shortness of breath, little edema, no pain or cramping in legs when walking, no lightheaded or dizziness    History of Present Illness:  [name] is a 81 y.o. female who returns in follow-up for sinus node dysfunction, complete heart block, paroxysmal atrial fibrillation and pacemaker check. She just saw Dr. Claiborne Billings on December 1. She has no new cardiac complaints, but continues to describe occasional mild ankle swelling, occasional mild dyspnea.  Her St. Jude Zephyr [id number] dual chamber device is functioning normally and shows an anticipated 2-2.5 years of remaining generator longevity. There is 100% A sensed V paced rhythm and there is no underlying escape rhythm. No atrial or ventricular tachyarrhythmia has occurred since last check.  Past Medical History  Diagnosis Date  . Esophageal reflux   . Allergic rhinitis   . Asthmatic bronchitis   . Chronic cough   . Hypertension   . Hyperlipemia   . PAF (paroxysmal atrial fibrillation) ([place])   . GERD (gastroesophageal reflux disease)   . Hx of echocardiogram 11/05/2010    EF>55% showed a normal systolic function with impaired diastolic dysfuntion and also tissue doppler suggesting increased elevated left atrial pressure, she had moderate LA dilatation and mild LA dilatation. There was calcified mitral apparatus with moderate MR, mild to moderate TR and trace aortic insufficiency.  . History of stress test 11/20/2009    This was essentially normal,post-stress EF was hyperdynamic at 73 beats per mins, There was no scar or ischemia.    Past Surgical History  Procedure Laterality Date  . Pacemaker insertion   June 1st 2009    St Jude [place] device, model #5826, serial [id number], The artial lead is a Interior and spatial designer, the ventricular lead is Environmental education officer.  Marland Kitchen Foot surgery      right  . Thyroid surgery    . Vesicovaginal fistula closure w/ tah    . Cholecystectomy    . Tonsillectomy    . Nasal septum surgery      Current Outpatient Prescriptions  Medication Sig Dispense Refill  . aspirin 81 MG tablet Take 81 mg by mouth daily.      . benzonatate (TESSALON) 200 MG capsule Take 1 capsule (200 mg total) by mouth 3 (three) times daily as needed for cough. 30 capsule 5  . cholecalciferol (VITAMIN D) 1000 UNITS tablet Take 1,000 Units by mouth daily.      Marland Kitchen diltiazem (DILACOR XR) 180 MG 24 hr capsule TAKE ONE CAPSULE BY MOUTH ONE TIME DAILY 30 capsule 6  . LORazepam (ATIVAN) 0.5 MG tablet Take 0.5 mg by mouth 2 (two) times daily as needed for anxiety.     . metoprolol succinate (TOPROL-XL) 50 MG 24 hr tablet TAKE ONE TABLET BY MOUTH TWICE DAILY  60 tablet 11  . Multiple Vitamin (MULTIVITAMIN) tablet Take 1 tablet by mouth daily.      . NON FORMULARY Allergy vaccines 1:500 biweekly GH    . pantoprazole (PROTONIX) 40 MG tablet Take 1 tablet by mouth daily before breakfast.    . pravastatin (PRAVACHOL) 40 MG tablet TAKE 1 TABLET (40 MG TOTAL)  BY MOUTH EVERY EVENING. 90 tablet 2  . SYNTHROID 25 MCG tablet Take 1 tablet by mouth daily.  12  . valsartan-hydrochlorothiazide (DIOVAN-HCT) 160-25 MG per tablet Take 1 tablet by mouth daily. 30 tablet 6   No current facility-administered medications for this visit.    Allergies:   Erythromycin; Fluticasone-salmeterol; and Sulfonamide derivatives   Social History   Social History  . Marital Status: Married    Spouse Name: N/A  . Number of Children: N/A  . Years of Education: N/A   Occupational History  .      Retired Sales promotion account executive at [employer]   Social History Main Topics  . Smoking status: Never Smoker   . Smokeless tobacco: Never Used   . Alcohol Use: No  . Drug Use: No  . Sexual Activity: Not Asked   Other Topics Concern  . None   Social History Narrative     Family History:  The patient's family history includes Cancer in her father; Stroke in her mother and sister.   ROS:   Please see the history of present illness.    Review of Systems  Cardiovascular: Positive for dyspnea on exertion.  Musculoskeletal: Positive for arthritis, back pain, muscle cramps and stiffness.   All other systems reviewed and are negative.   PHYSICAL EXAM:   VS:  BP 128/52 mmHg  Pulse 84  Resp 16  Ht 4\' 10"  (1.473 m)  Wt 161 lb 5 oz (73.171 kg)  BMI 33.72 kg/m2   GEN: Well nourished, well developed, in no acute distress HEENT: normal Neck: no JVD, carotid bruits, or masses Cardiac: RRR; paradoxically split second heart sound, no murmurs, rubs, or gallops,no edema ; healthy pacemaker site Respiratory:  clear to auscultation bilaterally, normal work of breathing GI: soft, nontender, nondistended, + BS MS: no deformity or atrophy Skin: warm and dry, no rash Neuro:  Alert and Oriented x 3, Strength and sensation are intact Psych: euthymic mood, full affect  Wt Readings from Last 3 Encounters:  06/10/15 161 lb 5 oz (73.171 kg)  05/22/15 159 lb (72.122 kg)  03/13/15 160 lb (72.576 kg)      Studies/Labs Reviewed:   EKG:  EKG is not ordered today.  The ekg ordered December 1 demonstrates 100% AV sequential pacing  Recent Labs: 09/09/2014: Pro B Natriuretic peptide (BNP) 101.0* 10/16/2014: ALT 21; BUN 18; Creatinine, Ser 1.14*; Hemoglobin 11.0*; Platelets 216; Potassium 3.7; Sodium 136   Lipid Panel    Component Value Date/Time   CHOL 136 03/13/2015 1436   TRIG 95 03/13/2015 1436   HDL 57 03/13/2015 1436   CHOLHDL 2.4 03/13/2015 1436   VLDL 19 03/13/2015 1436   LDLCALC 60 03/13/2015 1436    Additional studies/ records that were reviewed today include:  Dr. Evette Georges note from December 1    ASSESSMENT:    1. CHB  (complete heart block) ([place])   2. Pacemaker   3. Paroxysmal atrial fibrillation ([facility])   4. Hyperlipidemia      PLAN:  In order of problems listed above:  1. Pacemaker dependent. No underlying escape rhythm 2. Normally functioning dual-chamber device. Unable to perform remote downloads with this model. In office check every 6 months, increased frequency to every 3 months when only one year battery longevity is expected 3. Atrial fibrillation has not been recorded in many years (at least since 2012). She is not receiving anticoagulation but is only on aspirin 4. Excellent lipid profile on statin.   Medication Adjustments/Labs and  Tests Ordered: Current medicines are reviewed at length with the patient today.  Concerns regarding medicines are outlined above.  Medication changes, Labs and Tests ordered today are listed below. Patient Instructions  Dr. Sallyanne Kuster recommends that you schedule a follow-up appointment in: [place] ([place])        Mikael Spray, MD  06/12/2015 8:42 AM    [facility] [place], [place], [place]  [address] Phone: [phone]; Fax: [phone]

## 2015-06-12 ENCOUNTER — Encounter: Payer: Self-pay | Admitting: Cardiovascular Disease

## 2015-06-12 ENCOUNTER — Ambulatory Visit: Payer: Medicare Other

## 2015-06-18 ENCOUNTER — Other Ambulatory Visit: Payer: Self-pay | Admitting: Cardiovascular Disease

## 2015-06-18 LAB — CUP PACEART INCLINIC DEVICE CHECK
Date Time Interrogation Session: 20161228163810
Implantable Lead Implant Date: 20090601
Implantable Lead Location: 753859
Implantable Lead Location: 753860
Lead Channel Setting Pacing Pulse Width: 0.5 ms
MDC IDC LEAD IMPLANT DT: 20090605
MDC IDC SET LEADCHNL RA PACING AMPLITUDE: 2 V
MDC IDC SET LEADCHNL RV SENSING SENSITIVITY: 4 mV
Pulse Gen Model: 5826
Pulse Gen Serial Number: 2109576

## 2015-06-18 NOTE — Telephone Encounter (Signed)
Rx request sent to pharmacy.  

## 2015-06-19 ENCOUNTER — Ambulatory Visit (INDEPENDENT_AMBULATORY_CARE_PROVIDER_SITE_OTHER): Payer: Medicare Other

## 2015-06-19 DIAGNOSIS — J309 Allergic rhinitis, unspecified: Secondary | ICD-10-CM

## 2015-06-23 ENCOUNTER — Other Ambulatory Visit: Payer: Self-pay | Admitting: Cardiovascular Disease

## 2015-06-24 NOTE — Telephone Encounter (Signed)
Rx request sent to pharmacy.  

## 2015-07-03 ENCOUNTER — Ambulatory Visit (INDEPENDENT_AMBULATORY_CARE_PROVIDER_SITE_OTHER): Payer: Medicare Other

## 2015-07-03 DIAGNOSIS — J309 Allergic rhinitis, unspecified: Secondary | ICD-10-CM

## 2015-07-17 ENCOUNTER — Ambulatory Visit (INDEPENDENT_AMBULATORY_CARE_PROVIDER_SITE_OTHER): Payer: Medicare Other

## 2015-07-17 DIAGNOSIS — J309 Allergic rhinitis, unspecified: Secondary | ICD-10-CM

## 2015-07-31 ENCOUNTER — Ambulatory Visit: Payer: Medicare Other

## 2015-08-07 ENCOUNTER — Ambulatory Visit: Payer: Medicare Other

## 2015-08-14 ENCOUNTER — Ambulatory Visit (INDEPENDENT_AMBULATORY_CARE_PROVIDER_SITE_OTHER): Payer: Medicare Other

## 2015-08-14 DIAGNOSIS — J309 Allergic rhinitis, unspecified: Secondary | ICD-10-CM

## 2015-08-28 ENCOUNTER — Ambulatory Visit (INDEPENDENT_AMBULATORY_CARE_PROVIDER_SITE_OTHER): Payer: Medicare Other | Admitting: *Deleted

## 2015-08-28 DIAGNOSIS — J309 Allergic rhinitis, unspecified: Secondary | ICD-10-CM

## 2015-09-02 DIAGNOSIS — R7301 Impaired fasting glucose: Secondary | ICD-10-CM | POA: Diagnosis not present

## 2015-09-02 DIAGNOSIS — I10 Essential (primary) hypertension: Secondary | ICD-10-CM | POA: Diagnosis not present

## 2015-09-02 DIAGNOSIS — E559 Vitamin D deficiency, unspecified: Secondary | ICD-10-CM | POA: Diagnosis not present

## 2015-09-02 DIAGNOSIS — E038 Other specified hypothyroidism: Secondary | ICD-10-CM | POA: Diagnosis not present

## 2015-09-02 DIAGNOSIS — E7800 Pure hypercholesterolemia, unspecified: Secondary | ICD-10-CM | POA: Diagnosis not present

## 2015-09-04 DIAGNOSIS — E559 Vitamin D deficiency, unspecified: Secondary | ICD-10-CM | POA: Diagnosis not present

## 2015-09-04 DIAGNOSIS — R5382 Chronic fatigue, unspecified: Secondary | ICD-10-CM | POA: Diagnosis not present

## 2015-09-04 DIAGNOSIS — E7800 Pure hypercholesterolemia, unspecified: Secondary | ICD-10-CM | POA: Diagnosis not present

## 2015-09-04 DIAGNOSIS — R7301 Impaired fasting glucose: Secondary | ICD-10-CM | POA: Diagnosis not present

## 2015-09-04 DIAGNOSIS — I10 Essential (primary) hypertension: Secondary | ICD-10-CM | POA: Diagnosis not present

## 2015-09-04 DIAGNOSIS — M8589 Other specified disorders of bone density and structure, multiple sites: Secondary | ICD-10-CM | POA: Diagnosis not present

## 2015-09-04 DIAGNOSIS — E038 Other specified hypothyroidism: Secondary | ICD-10-CM | POA: Diagnosis not present

## 2015-09-11 ENCOUNTER — Encounter: Payer: Self-pay | Admitting: Internal Medicine

## 2015-09-11 ENCOUNTER — Ambulatory Visit (INDEPENDENT_AMBULATORY_CARE_PROVIDER_SITE_OTHER): Payer: Medicare Other | Admitting: *Deleted

## 2015-09-11 ENCOUNTER — Ambulatory Visit (INDEPENDENT_AMBULATORY_CARE_PROVIDER_SITE_OTHER): Payer: Medicare Other | Admitting: Internal Medicine

## 2015-09-11 VITALS — BP 120/76 | HR 70 | Ht <= 58 in | Wt 163.6 lb

## 2015-09-11 DIAGNOSIS — J301 Allergic rhinitis due to pollen: Secondary | ICD-10-CM

## 2015-09-11 DIAGNOSIS — J309 Allergic rhinitis, unspecified: Secondary | ICD-10-CM | POA: Diagnosis not present

## 2015-09-11 DIAGNOSIS — R059 Cough, unspecified: Secondary | ICD-10-CM

## 2015-09-11 DIAGNOSIS — R05 Cough: Secondary | ICD-10-CM | POA: Diagnosis not present

## 2015-09-11 NOTE — Patient Instructions (Signed)
Ok to continue current meds  Order- schedule PFT dx chronic cough  We can continue allergy vaccine for another year

## 2015-09-11 NOTE — Progress Notes (Signed)
Patient ID: [name], female    DOB: 1932/03/15, 82 y.o.   MRN: [id number]  HPI 02/23/11- 53 yoF never smoker followed for asthma, allergic rhinitis, cough, complicated by GERD Last here August 21, 2010 Holding allergy vaccine at 1:500 to avoid local reactions. Her cough persists with throat tickle. Takes a lot of cough drops. Admits ongoing occasional reflux, which we discussed again. In September we discussed her GI visit with Dr Penelope Coop who had done endoscopy and recommended she take 2 Nexium- insurance won't pay. She has 3 cats- all her life. HOB is elevated.  03/09/2011 -79 yoF never smoker followed for asthma, allergic rhinitis, cough, complicated by GERD Acute visit. For 2 weeks she has had a sore throat with increased postnasal drainage worse cough watery nose. She cancelled her methacholine test because of the cough. Nose and ears are itching. We had doubled her GERD medication by adding omeprazole plus Nexium. She does admit some reflux but not heartburn. Allergy profile: 02/23/2011 IgE 14.1 with elevations for ragweed and [place]  04/27/11- 79 yoF never smoker followed for asthma, allergic rhinitis, cough, complicated by GERD Has had flu vaccine. Coughing is a little better. She thinks it might be worse on the days she gets her allergy shots and her son has pointed that out. She does not cough at night. Coughs in the evenings, not related to meals. She doesn't often feel herself refluxing while she takes omeprazole and sleeps with the head of her bed elevated. We again discussed the idea of "cyclic cough". She was sick today she was scheduled to have a methacholine inhalation challenge test. We will reschedule if it seems useful.  10/26/11- 79 yoF never smoker followed for asthma, allergic rhinitis, cough, complicated by GERD After concern that allergy shts were associated w/ incvreased cough, she decided she was better off on them and restarted. Now at 1:500. Some throat clearing and  postnasal drip. No overt reflux. HOB is raised. Failed benzonatate.  01/03/12 Acute OV  pt c/o dry cough x 2-3 years with little to no production. pt states she has had some wheezing at night time and some during the day time. Pt also c/o some chest congestion that comes and goes as well as PND.  pt states she completed a course of Pred with no relief. Called in steroid taper not much help  +nasal drip  No fever or discolored mucus  Never smoker. cxr  02/2011 no acute process.   02/11/12- 80 yoF never smoker followed for asthma, allergic rhinitis, cough, complicated by GERD Has been using non- menthol cough drops to help with cough. Would say cough better since last visit.  Tramadol has been some help. Fewer bad coughing spells, noted mostly after meals. We discussed reflux and cough during swallowing. Uses omeprazole and Nexium. Does occasionally feel reflux events. Occasional mild wheeze but no chest pain or shortness of breath. She feels allergy vaccine does well, currently rebuilding.  05/04/12- 80 yoF never smoker followed for asthma, allergic rhinitis, cough, complicated by GERD FOLLOWS FOR: still having slight cough-productive-slight yellow in color; recently sick. Wheezing as well. Flare of hard persistent dry coughing started while on the telephone a few days ago. She didn't recognize it as a reflux event. Cough is now better but not quite back to baseline. She continues allergy vaccine 1:50 [place]  09/01/12 80 yoF never smoker followed for asthma, allergic rhinitis, cough, complicated by GERD FOLLOWS FOR: still on vaccine; sneezing, runny nose x 4 days.  Son had a cold and now she has caught it. Sick x5 days with nasal itching, sneezing. Some chills yesterday but no sore throat or fever. Continues allergy vaccine 1:50 [facility] without problems. CXR 05/05/12- IMPRESSION:  No active cardiopulmonary disease. Chronic change.  Original Report Authenticated By: [name], M.D.  03/06/13- 67 yoF  never smoker followed for asthma, allergic rhinitis, cough, complicated by GERD FOLLOWS [id number] well,still gets Allergy vaccine 1:50 [facility],had today,tickle in throat,denies cough,sob,wheeze Gets her flu shot from her primary physician. We talked about what to do with allergy vaccine. She gets injections from 2 divided vials and the ragweed side usually itches some.  09/03/13- 81 yoF never smoker followed for asthma, allergic rhinitis, cough, complicated by GERD FOLLOWS FOR: Still on Allergy  Vaccine (A-1:50, B-1:50, GH) and denies any flare ups at this time. Only occasional minor itch local reaction to allergy shots. Separate injections for each vial.  Occ minor wheeze. Denies need for rescue inhaler. Reflux stable, controlled.   03/11/14- 26 yoF never smoker followed for asthma, allergic rhinitis, cough, complicated by GERD Follows for: Pt denies any complaints at this time.  Still on Allergy  Vaccine (A-1:50, B-1:50, [place])  06/04/14-81 yoF never smoker followed for asthma, allergic rhinitis, cough, complicated by GERD ACUTE VISIT: "cold like symptoms", cough-slightly productive, son told her he hears her wheezing as well.  Had a cold in November never really got over it with some persistent wheezy cough since then. Continues allergy vaccine 1:50GH- has helped.  07/22/2014  82yoF never smoker followed for asthma, allergic rhinitis, cough, complicated by GERD Returns for  2 week follow up for asthma flare. Tx w/ pred taper.  She is feeling better.  Reports breathing is back to baseline except for some residual cough ?reflux Says she still get drainage in her throat. Frequent tickle and throat clearing.  Has increased GERD at night despite PPI daily.  She denies any fever, chest pain, orthopnea, PND or leg swelling  09/09/14- 82yoF never smoker followed for asthma, allergic rhinitis, cough, complicated by GERD FOLLOW FOR:  Allergies.  coughing all the time.   03/13/15- 83yoF never smoker  followed for asthma, allergic rhinitis, cough, complicated by GERD, CHB/ pacer,  Allergy vaccine 1:50 GH LOV we added gabapentin for cough, suggested lasix 20-40 mg for edema, BNP was 101  FOLLOWS FOR: pt c/o fatigue, prod cough with unknown color.  pt states her cough is baseline.  tolerating allergy vaccines well, does note a cough after getting her injections.  Complains of feeling tired and admits the problem is that she stays up too late. Naps easily during the day. Still occasional dry cough. Less ankle edema. Took gabapentin in the past but doesn't remember it as helpful for cough. CTa chest 10/17/14-I reviewed IMPRESSION: 1. No evidence of pulmonary embolus. 2. Minimal bilateral atelectasis noted; lungs otherwise clear. 3. Scattered bilateral renal cysts seen. Electronically Signed  By: Garald Balding M.D.  On: 10/17/2014 01:01  09/11/2015-82 year old female never smoker followed for asthma, allergic rhinitis, cough, complicated by GERD, CHB/pacer Allergy vaccine 1:500 [facility] FOLLOWS FOR: Pt continues allergy injections every other week; pt states she has had cough for a long time and just unable to get rid of it. She continues on beta blocker and ARB, Protonix for GERD Allergy vaccine is held at 1:500 [place], highest tolerated dose. No change in her chronic cough which varies some but is not clearly seasonal or exposure related. Occasional mild wheeze with exertion only.  ROS-see HPI Constitutional:  No-   weight loss, night sweats, fevers, chills, fatigue, lassitude. HEENT:   No-  headaches, difficulty swallowing, tooth/dental problems, sore throat,       No-  sneezing, itching, ear ache, nasal congestion, post nasal drip,  CV:  No-   chest pain, orthopnea, PND, swelling in lower extremities, anasarca,  dizziness, palpitations Resp: No-   shortness of breath with exertion or at rest.              No-   productive cough,  + non-productive cough,  No- coughing up of blood.               No-   change in color of mucus.  Skin: No-   rash or lesions. GI:  No-   , abdominal pain, nausea, vomiting, GU:  MS:  No-   joint pain or swelling.   Neuro-     nothing unusual Psych:  No- change in mood or affect. No depression or anxiety.  No memory loss.  OBJ- Physical Exam General- Alert, Oriented, Affect-appropriate, Distress- none acute, + overweight Skin- rash-none, lesions- none, excoriation- none Lymphadenopathy- none Head- atraumatic            Eyes- Gross vision intact, PERRLA, conjunctivae and secretions clear            Ears- Hearing, canals-normal            Nose- Clear, no-Septal dev, mucus, polyps, erosion, perforation             Throat- Mallampati II , mucosa clear , drainage- none, tonsils- atrophic.  Neck- flexible , trachea midline, no stridor , thyroid nl, carotid no bruit Chest - symmetrical excursion , unlabored           Heart/CV- RRR , no murmur , no gallop  , no rub, nl s1 s2                           - JVD- none , edema- none, stasis changes- none, varices- none           Lung- clear to P&A, wheeze- none, no- cough , dullness-none, rub- none           Chest wall- + right chest pacemaker Abd-  Br/ Gen/ Rectal- Not done, not indicated Extrem- cyanosis- none, clubbing, none, atrophy- none, strength- nl Neuro- grossly intact to observation

## 2015-09-14 NOTE — Assessment & Plan Note (Signed)
We have discussed common triggers for chronic cough. She describes only minimal wheeze with exertion. Plan-schedule PFT. Continue reflux precautions.

## 2015-09-14 NOTE — Assessment & Plan Note (Signed)
She continues allergy vaccine at 1:500 [place]. We discussed options since I anticipate retiring in a year. She chooses to continue allergy vaccine for the next year and consider stopping for observation versus transfer to another office.

## 2015-09-25 ENCOUNTER — Ambulatory Visit (INDEPENDENT_AMBULATORY_CARE_PROVIDER_SITE_OTHER): Payer: Medicare Other | Admitting: *Deleted

## 2015-09-25 DIAGNOSIS — J309 Allergic rhinitis, unspecified: Secondary | ICD-10-CM | POA: Diagnosis not present

## 2015-10-02 ENCOUNTER — Encounter: Payer: Self-pay | Admitting: Cardiology

## 2015-10-02 ENCOUNTER — Ambulatory Visit (INDEPENDENT_AMBULATORY_CARE_PROVIDER_SITE_OTHER): Payer: Medicare Other | Admitting: Cardiology

## 2015-10-02 VITALS — BP 130/60 | HR 70 | Ht <= 58 in | Wt 165.0 lb

## 2015-10-02 DIAGNOSIS — R5383 Other fatigue: Secondary | ICD-10-CM | POA: Diagnosis not present

## 2015-10-02 DIAGNOSIS — Z950 Presence of cardiac pacemaker: Secondary | ICD-10-CM

## 2015-10-02 DIAGNOSIS — I480 Paroxysmal atrial fibrillation: Secondary | ICD-10-CM

## 2015-10-02 DIAGNOSIS — I10 Essential (primary) hypertension: Secondary | ICD-10-CM

## 2015-10-02 DIAGNOSIS — J4520 Mild intermittent asthma, uncomplicated: Secondary | ICD-10-CM

## 2015-10-02 NOTE — Progress Notes (Signed)
10/02/2015 [name]   07-31-1931  [id number]  Primary Physician Limmie Patricia, MD Primary Cardiologist: Dr Kelly/ Dr Sallyanne Kuster  HPI:  82 y/o obese female with a history of dual-chamber pacemaker implantation for complete heart block in June 2009. She has a history of brief paroxysmal atrial fibrillation-on ASA only, a history of lower extremity edema, GERD, mild asthma, chronic cough, hypertension, and hyperlipidemia. Her last pacemaker interrogation was done in Dec 2016 by Dr. Sallyanne Kuster. At that time, she had normal pacemaker function. She was pacing 100% of the time in both the atrium and ventricle. An echo Doppler study on 10/23/2014 revealed an ejection fraction in the 50-55% range with grade 1 diastolic dysfunction and mild mitral regurgitation. She was last in the office in Dec 2016. She came in today because of vague fatigue symptoms. She was concerned it may have something to do with her heart. She denies any orthopnea, increased LE edema,  tachycardia, syncope, or chest pain.    Current Outpatient Prescriptions  Medication Sig Dispense Refill  . aspirin 81 MG tablet Take 81 mg by mouth daily.      . cholecalciferol (VITAMIN D) 1000 UNITS tablet Take 1,000 Units by mouth daily.      Marland Kitchen diltiazem (DILACOR XR) 180 MG 24 hr capsule TAKE ONE CAPSULE BY MOUTH ONE TIME DAILY 30 capsule 6  . LORazepam (ATIVAN) 0.5 MG tablet Take 0.5 mg by mouth 2 (two) times daily as needed for anxiety.     . metoprolol succinate (TOPROL-XL) 50 MG 24 hr tablet TAKE ONE TABLET BY MOUTH TWICE DAILY (Patient taking differently: TAKE ONE TABLET BY MOUTH  DAILY) 60 tablet 6  . Multiple Vitamin (MULTIVITAMIN) tablet Take 1 tablet by mouth daily.      . NON FORMULARY Allergy vaccines 1:500 biweekly [facility]    . pantoprazole (PROTONIX) 40 MG tablet Take 1 tablet by mouth daily before breakfast.    . pravastatin (PRAVACHOL) 40 MG tablet TAKE 1 TABLET (40 MG TOTAL) BY MOUTH EVERY EVENING. 90 tablet 2  .  SYNTHROID 25 MCG tablet Take 1 tablet by mouth daily.  12  . valsartan-hydrochlorothiazide (DIOVAN-HCT) 160-25 MG tablet TAKE 1 TABLET BY MOUTH DAILY. 30 tablet 6   No current facility-administered medications for this visit.    Allergies  Allergen Reactions  . Erythromycin Nausea And Vomiting and Other (See Comments)    Stomach cramps  . Fluticasone-Salmeterol Other (See Comments)    unknown  . Sulfonamide Derivatives Other (See Comments)    unknown    Social History   Social History  . Marital Status: Married    Spouse Name: N/A  . Number of Children: N/A  . Years of Education: N/A   Occupational History  .      Retired Sales promotion account executive at [employer]   Social History Main Topics  . Smoking status: Never Smoker   . Smokeless tobacco: Never Used  . Alcohol Use: No  . Drug Use: No  . Sexual Activity: Not on file   Other Topics Concern  . Not on file   Social History Narrative     Review of Systems: General: negative for chills, fever, night sweats or weight changes.  Cardiovascular: negative for chest pain, dyspnea on exertion, edema, orthopnea, palpitations, paroxysmal nocturnal dyspnea or shortness of breath Dermatological: negative for rash Respiratory: negative for cough or wheezing Urologic: negative for hematuria Abdominal: negative for nausea, vomiting, diarrhea, bright red blood per rectum, melena, or hematemesis Neurologic: negative for  visual changes, syncope, or dizziness All other systems reviewed and are otherwise negative except as noted above.    Blood pressure 130/60, pulse 70, height 4\' 10"  (1.473 m), weight 165 lb (74.844 kg).  General appearance: alert, cooperative, no distress and moderately obese Lungs: clear to auscultation bilaterally Heart: regular rate and rhythm Extremities: chronic venous edema Skin: Skin color, texture, turgor normal. No rashes or lesions Neurologic: Grossly normal  EKG Paced  ASSESSMENT AND PLAN:    Fatigue Pt in the office today secondary to complaints of fatigue  Pacemaker St. Jude [place] model 5826 implanted June 2009, complete heart block, pacemaker dependent  Paroxysmal atrial fibrillation Very brief episodes were recorded by her pacemaker in 2012 (less than 2 minutes) they have not recurred since. She is on aspirin alone for embolism prophylaxis  HTN (hypertension) Controlled  Asthma Followed by Dr Jacqlyn Krauss  I suggested she decrease her Toprol to 50 mg daily. I have requested recent labs (TSH, BMP, CBC) from Dr Altheimer's office. She'll contact us if this medication adjustment doesn't help her symptoms.   Alee Gressman K PA-C 10/02/2015 10:18 AM

## 2015-10-02 NOTE — Assessment & Plan Note (Signed)
Followed by Dr. Young. 

## 2015-10-02 NOTE — Assessment & Plan Note (Signed)
Pt in the office today secondary to complaints of fatigue

## 2015-10-02 NOTE — Patient Instructions (Signed)
Your physician recommends that you schedule a follow-up appointment in: 6 Months  Your physician has recommended you make the following change in your medication: Decrease Metoprolol 50 mg daily

## 2015-10-02 NOTE — Assessment & Plan Note (Signed)
St. Jude [place] model 5826 implanted June 2009, complete heart block, pacemaker dependent

## 2015-10-02 NOTE — Assessment & Plan Note (Signed)
Controlled.  

## 2015-10-02 NOTE — Assessment & Plan Note (Signed)
Very brief episodes were recorded by her pacemaker in 2012 (less than 2 minutes) they have not recurred since. She is on aspirin alone for embolism prophylaxis

## 2015-10-09 ENCOUNTER — Ambulatory Visit: Payer: Medicare Other

## 2015-11-06 ENCOUNTER — Encounter: Payer: Self-pay | Admitting: Internal Medicine

## 2015-11-13 ENCOUNTER — Telehealth: Payer: Self-pay | Admitting: Internal Medicine

## 2015-11-13 NOTE — Telephone Encounter (Signed)
Suspect recent wet weather may be problem  Suggest try prednisone 20 mg, # 4, 1 daily  Ok to resume allergy shots if you don't get better with the prednisone and feel as if the allergy shots may help. You can call Alroy Bailiff to re-start if you decide that's what you want to do.

## 2015-11-13 NOTE — Telephone Encounter (Signed)
LVM for pt to return call

## 2015-11-13 NOTE — Telephone Encounter (Signed)
Pt dropped letter off at the office:  Dr Annamaria Boots, I took my last shot about 6 weeks ago.  I missed one time and never made another appointment.  You know I always cough.  It has gotten worse.  My throat feels raspy and tickles a lot.  There seems to be something coming up from my lungs to make me clear my throat.  I do use a lot of cough drops and I have taken some of the Benzonatate 200mg  capsules.  They don't seem to help.  So, what do I do?  Do I start back my shots?  Do I get some Robitussin?  I appreciate you reading this.  If I'm not at home please leave a message.  If I should get my shots please me know when.  Thanks!  Geralyn Flash   Last ov 3.23.17 w/ CDY:  Patient Instructions       Ok to continue current meds  Order- schedule PFT dx chronic cough  We can continue allergy vaccine for another year   Dr Annamaria Boots please advise, thank you.   *Letter placed in CY's scan folder

## 2015-11-14 MED ORDER — PREDNISONE 20 MG PO TABS
20.0000 mg | ORAL_TABLET | Freq: Every day | ORAL | Status: DC
Start: 2015-11-14 — End: 2015-11-26

## 2015-11-14 NOTE — Telephone Encounter (Signed)
Spoke with pt. She is aware of CY's recommendations. Rx has been sent in. Pt will call us back if this does not help symptoms.

## 2015-11-21 ENCOUNTER — Ambulatory Visit (INDEPENDENT_AMBULATORY_CARE_PROVIDER_SITE_OTHER): Payer: Medicare Other | Admitting: *Deleted

## 2015-11-21 DIAGNOSIS — H25811 Combined forms of age-related cataract, right eye: Secondary | ICD-10-CM | POA: Diagnosis not present

## 2015-11-21 DIAGNOSIS — H26492 Other secondary cataract, left eye: Secondary | ICD-10-CM | POA: Diagnosis not present

## 2015-11-21 DIAGNOSIS — J309 Allergic rhinitis, unspecified: Secondary | ICD-10-CM | POA: Diagnosis not present

## 2015-11-21 DIAGNOSIS — Z961 Presence of intraocular lens: Secondary | ICD-10-CM | POA: Diagnosis not present

## 2015-11-26 ENCOUNTER — Encounter: Payer: Self-pay | Admitting: Cardiovascular Disease

## 2015-11-26 ENCOUNTER — Ambulatory Visit (INDEPENDENT_AMBULATORY_CARE_PROVIDER_SITE_OTHER): Payer: Medicare Other | Admitting: Cardiovascular Disease

## 2015-11-26 VITALS — BP 132/62 | HR 70 | Ht 59.0 in | Wt 165.2 lb

## 2015-11-26 DIAGNOSIS — I442 Atrioventricular block, complete: Secondary | ICD-10-CM | POA: Diagnosis not present

## 2015-11-26 DIAGNOSIS — I10 Essential (primary) hypertension: Secondary | ICD-10-CM

## 2015-11-26 DIAGNOSIS — I480 Paroxysmal atrial fibrillation: Secondary | ICD-10-CM

## 2015-11-26 DIAGNOSIS — Z950 Presence of cardiac pacemaker: Secondary | ICD-10-CM

## 2015-11-26 DIAGNOSIS — E785 Hyperlipidemia, unspecified: Secondary | ICD-10-CM

## 2015-11-26 MED ORDER — METOPROLOL SUCCINATE ER 50 MG PO TB24: ORAL_TABLET | ORAL | Status: DC

## 2015-11-26 NOTE — Patient Instructions (Addendum)
Your physician recommends that you continue on your current medications as directed. Please refer to the Current Medication list given to you today.  Dr Sallyanne Kuster recommends that you schedule a follow-up appointment in 6 months with a pacemaker check. You will receive a reminder letter in the mail two months in advance. If you don't receive a letter, please call our office to schedule the follow-up appointment.  If you need a refill on your cardiac medications before your next appointment, please call your pharmacy.

## 2015-11-26 NOTE — Progress Notes (Signed)
Patient ID: [name], female   DOB: 1932/06/06, 82 y.o.   MRN: [id number] Patient ID: [name], female   DOB: [birth year], 82 y.o.   MRN: [id number]    Cardiology Office Note    Date:  11/26/2015   ID:  [name], DOB 1932-04-23, MRN [id number]  PCP:  Limmie Patricia, MD  Cardiologist:   Sanda Klein, MD   Chief Complaint  Patient presents with  . Follow-up    6 months  pt c/o occasional CP--slightly sharp; occasional SOB; random lightheadedness--sometimes she feels like she is "out in space;" some swelling in legs/feet/ankles    History of Present Illness:  [name] is a 82 y.o. female who returns in follow-up for sinus node dysfunction, complete heart block, paroxysmal atrial fibrillation and pacemaker check.  She has no new cardiac complaints, but continues to describe occasional mild ankle swelling, occasional mild dyspnea. He has occasional random episodes of lightheadedness not associated with changes in position. She has not experienced true syncope. She has occasional sharp chest pain not associated with activity.  Her St. Jude Zephyr [id number] dual chamber device is functioning normally and shows an anticipated 1.75-2.25 years of remaining generator longevity. There is 97% A paced and 100% V paced rhythm and there is no underlying escape rhythm. No thickened atrial or ventricular tachyarrhythmia has occurred since last check.  Past Medical History  Diagnosis Date  . Esophageal reflux   . Allergic rhinitis   . Asthmatic bronchitis   . Chronic cough   . Hypertension   . Hyperlipemia   . PAF (paroxysmal atrial fibrillation) ([place] [facility])   . GERD (gastroesophageal reflux disease)   . Hx of echocardiogram 11/05/2010    EF>55% showed a normal systolic function with impaired diastolic dysfuntion and also tissue doppler suggesting increased elevated left atrial pressure, she had moderate LA dilatation and mild LA dilatation. There was calcified mitral apparatus with moderate  MR, mild to moderate TR and trace aortic insufficiency.  . History of stress test 11/20/2009    This was essentially normal,post-stress EF was hyperdynamic at 73 beats per mins, There was no scar or ischemia.    Past Surgical History  Procedure Laterality Date  . Pacemaker insertion  June 1st 2009    St Jude [place] device, model #5826, serial [id number], The artial lead is a Interior and spatial designer, the ventricular lead is Environmental education officer.  Marland Kitchen Foot surgery      right  . Thyroid surgery    . Vesicovaginal fistula closure w/ tah    . Cholecystectomy    . Tonsillectomy    . Nasal septum surgery      Current Outpatient Prescriptions  Medication Sig Dispense Refill  . aspirin 81 MG tablet Take 81 mg by mouth daily.      . cholecalciferol (VITAMIN D) 1000 UNITS tablet Take 1,000 Units by mouth daily.      Marland Kitchen diltiazem (DILACOR XR) 180 MG 24 hr capsule TAKE ONE CAPSULE BY MOUTH ONE TIME DAILY 30 capsule 6  . LORazepam (ATIVAN) 0.5 MG tablet Take 0.5 mg by mouth 2 (two) times daily as needed for anxiety.     . metoprolol succinate (TOPROL-XL) 50 MG 24 hr tablet TAKE ONE TABLET BY MOUTH TWICE DAILY (Patient taking differently: TAKE ONE TABLET BY MOUTH  DAILY) 60 tablet 6  . NON FORMULARY Allergy vaccines 1:500 biweekly GH    . pantoprazole (PROTONIX) 40 MG tablet Take 1 tablet by mouth daily before breakfast.    .  pravastatin (PRAVACHOL) 40 MG tablet TAKE 1 TABLET (40 MG TOTAL) BY MOUTH EVERY EVENING. 90 tablet 2  . SYNTHROID 25 MCG tablet Take 1 tablet by mouth daily.  12  . valsartan-hydrochlorothiazide (DIOVAN-HCT) 160-25 MG tablet TAKE 1 TABLET BY MOUTH DAILY. 30 tablet 6   No current facility-administered medications for this visit.    Allergies:   Erythromycin; Fluticasone-salmeterol; and Sulfonamide derivatives   Social History   Social History  . Marital Status: Married    Spouse Name: N/A  . Number of Children: N/A  . Years of Education: N/A   Occupational History  .      Retired  Sales promotion account executive at [employer]   Social History Main Topics  . Smoking status: Never Smoker   . Smokeless tobacco: Never Used  . Alcohol Use: No  . Drug Use: No  . Sexual Activity: Not Asked   Other Topics Concern  . None   Social History Narrative     Family History:  The patient's family history includes Cancer in her father; Stroke in her mother and sister.   ROS:   Please see the history of present illness.    Review of Systems  Cardiovascular: Positive for dyspnea on exertion.  Musculoskeletal: Positive for arthritis, back pain, muscle cramps and stiffness.   All other systems reviewed and are negative.   PHYSICAL EXAM:   VS:  BP 132/62 mmHg  Pulse 70  Ht 4\' 11"  (1.499 m)  Wt 74.934 kg (165 lb 3.2 oz)  BMI 33.35 kg/m2  SpO2 98%   GEN: Well nourished, well developed, in no acute distress HEENT: normal Neck: no JVD, carotid bruits, or masses Cardiac: RRR; paradoxically split second heart sound, no murmurs, rubs, or gallops,no edema ; healthy pacemaker site Respiratory:  clear to auscultation bilaterally, normal work of breathing GI: soft, nontender, nondistended, + BS MS: no deformity or atrophy Skin: warm and dry, no rash Neuro:  Alert and Oriented x 3, Strength and sensation are intact Psych: euthymic mood, full affect  Wt Readings from Last 3 Encounters:  11/26/15 74.934 kg (165 lb 3.2 oz)  10/02/15 74.844 kg (165 lb)  09/11/15 74.208 kg (163 lb 9.6 oz)      Studies/Labs Reviewed:   EKG:  EKG is not ordered today.  The ekg ordered December 1 demonstrates 100% AV sequential pacing  Recent Labs: No results found for requested labs within last 365 days.   Lipid Panel    Component Value Date/Time   CHOL 136 03/13/2015 1436   TRIG 95 03/13/2015 1436   HDL 57 03/13/2015 1436   CHOLHDL 2.4 03/13/2015 1436   VLDL 19 03/13/2015 1436   LDLCALC 60 03/13/2015 1436      ASSESSMENT:    1. CHB (complete heart block) (HCC)   2. Pacemaker   3.  Paroxysmal atrial fibrillation (HCC)   4. Essential hypertension   5. Hyperlipidemia      PLAN:  In order of problems listed above:  1. CHB: Pacemaker dependent. No underlying escape rhythm 2. PPM: Normally functioning dual-chamber device. Unable to perform remote downloads with this model. In office check every 6 months, increased frequency to every 3 months when only one year battery longevity is expected 3. AFib: Atrial fibrillation has not been recorded in many years (at least since 2012). She is not receiving anticoagulation but is only on aspirin 4. HTN: well controlled 5. HLP: Excellent lipid profile on statin.   Medication Adjustments/Labs and Tests Ordered: Current medicines  are reviewed at length with the patient today.  Concerns regarding medicines are outlined above.  Medication changes, Labs and Tests ordered today are listed below. There are no Patient Instructions on file for this visit.    Signed, Sanda Klein, MD  11/26/2015 11:06 AM    [facility] [place], [place], [place]  [address] Phone: [phone]; Fax: [phone]

## 2015-12-01 DIAGNOSIS — H43813 Vitreous degeneration, bilateral: Secondary | ICD-10-CM | POA: Diagnosis not present

## 2015-12-01 DIAGNOSIS — H35363 Drusen (degenerative) of macula, bilateral: Secondary | ICD-10-CM | POA: Diagnosis not present

## 2015-12-01 DIAGNOSIS — D3131 Benign neoplasm of right choroid: Secondary | ICD-10-CM | POA: Diagnosis not present

## 2015-12-01 DIAGNOSIS — H353131 Nonexudative age-related macular degeneration, bilateral, early dry stage: Secondary | ICD-10-CM | POA: Diagnosis not present

## 2015-12-02 ENCOUNTER — Ambulatory Visit (INDEPENDENT_AMBULATORY_CARE_PROVIDER_SITE_OTHER): Payer: Medicare Other | Admitting: Internal Medicine

## 2015-12-02 DIAGNOSIS — H2511 Age-related nuclear cataract, right eye: Secondary | ICD-10-CM | POA: Diagnosis not present

## 2015-12-02 DIAGNOSIS — R05 Cough: Secondary | ICD-10-CM | POA: Diagnosis not present

## 2015-12-02 DIAGNOSIS — R059 Cough, unspecified: Secondary | ICD-10-CM

## 2015-12-02 LAB — CUP PACEART INCLINIC DEVICE CHECK
Date Time Interrogation Session: 20170613111301
Implantable Lead Implant Date: 20090605
Implantable Lead Location: 753860
Lead Channel Setting Pacing Amplitude: 2 V
MDC IDC LEAD IMPLANT DT: 20090601
MDC IDC LEAD LOCATION: 753859
MDC IDC PG SERIAL: 2109576
MDC IDC SET LEADCHNL RV PACING PULSEWIDTH: 0.5 ms
MDC IDC SET LEADCHNL RV SENSING SENSITIVITY: 4 mV
Pulse Gen Model: 5826

## 2015-12-02 LAB — PULMONARY FUNCTION TEST
DL/VA % pred: 116 %
DL/VA: 4.76 ml/min/mmHg/L
DLCO COR % PRED: 79 %
DLCO COR: 13.85 ml/min/mmHg
DLCO UNC % PRED: 79 %
DLCO unc: 14.02 ml/min/mmHg
FEF 25-75 POST: 1.55 L/s
FEF 25-75 Pre: 1.13 L/sec
FEF2575-%CHANGE-POST: 36 %
FEF2575-%PRED-POST: 152 %
FEF2575-%PRED-PRE: 111 %
FEV1-%Change-Post: 7 %
FEV1-%PRED-PRE: 90 %
FEV1-%Pred-Post: 96 %
FEV1-Post: 1.36 L
FEV1-Pre: 1.27 L
FEV1FVC-%Change-Post: 1 %
FEV1FVC-%PRED-PRE: 107 %
FEV6-%CHANGE-POST: 5 %
FEV6-%Pred-Post: 94 %
FEV6-%Pred-Pre: 89 %
FEV6-Post: 1.71 L
FEV6-Pre: 1.62 L
FEV6FVC-%Pred-Post: 107 %
FEV6FVC-%Pred-Pre: 107 %
FVC-%Change-Post: 5 %
FVC-%Pred-Post: 88 %
FVC-%Pred-Pre: 83 %
FVC-Post: 1.71 L
FVC-Pre: 1.62 L
POST FEV1/FVC RATIO: 80 %
Post FEV6/FVC ratio: 100 %
Pre FEV1/FVC ratio: 79 %
Pre FEV6/FVC Ratio: 100 %
RV % pred: 75 %
RV: 1.64 L
TLC % pred: 77 %
TLC: 3.34 L

## 2015-12-02 NOTE — Progress Notes (Signed)
PFT done today. 

## 2015-12-03 DIAGNOSIS — E7800 Pure hypercholesterolemia, unspecified: Secondary | ICD-10-CM | POA: Diagnosis not present

## 2015-12-03 DIAGNOSIS — E559 Vitamin D deficiency, unspecified: Secondary | ICD-10-CM | POA: Diagnosis not present

## 2015-12-03 DIAGNOSIS — I10 Essential (primary) hypertension: Secondary | ICD-10-CM | POA: Diagnosis not present

## 2015-12-03 DIAGNOSIS — E038 Other specified hypothyroidism: Secondary | ICD-10-CM | POA: Diagnosis not present

## 2015-12-03 DIAGNOSIS — R7301 Impaired fasting glucose: Secondary | ICD-10-CM | POA: Diagnosis not present

## 2015-12-04 ENCOUNTER — Ambulatory Visit (INDEPENDENT_AMBULATORY_CARE_PROVIDER_SITE_OTHER): Payer: Medicare Other | Admitting: *Deleted

## 2015-12-04 ENCOUNTER — Encounter: Payer: Self-pay | Admitting: Cardiovascular Disease

## 2015-12-04 DIAGNOSIS — J309 Allergic rhinitis, unspecified: Secondary | ICD-10-CM | POA: Diagnosis not present

## 2015-12-08 DIAGNOSIS — R7301 Impaired fasting glucose: Secondary | ICD-10-CM | POA: Diagnosis not present

## 2015-12-08 DIAGNOSIS — R5382 Chronic fatigue, unspecified: Secondary | ICD-10-CM | POA: Diagnosis not present

## 2015-12-08 DIAGNOSIS — I10 Essential (primary) hypertension: Secondary | ICD-10-CM | POA: Diagnosis not present

## 2015-12-08 DIAGNOSIS — E038 Other specified hypothyroidism: Secondary | ICD-10-CM | POA: Diagnosis not present

## 2015-12-08 DIAGNOSIS — E7800 Pure hypercholesterolemia, unspecified: Secondary | ICD-10-CM | POA: Diagnosis not present

## 2015-12-08 DIAGNOSIS — M8589 Other specified disorders of bone density and structure, multiple sites: Secondary | ICD-10-CM | POA: Diagnosis not present

## 2015-12-08 DIAGNOSIS — E559 Vitamin D deficiency, unspecified: Secondary | ICD-10-CM | POA: Diagnosis not present

## 2015-12-15 DIAGNOSIS — H2511 Age-related nuclear cataract, right eye: Secondary | ICD-10-CM | POA: Diagnosis not present

## 2015-12-15 DIAGNOSIS — H25811 Combined forms of age-related cataract, right eye: Secondary | ICD-10-CM | POA: Diagnosis not present

## 2015-12-18 ENCOUNTER — Ambulatory Visit (INDEPENDENT_AMBULATORY_CARE_PROVIDER_SITE_OTHER): Payer: Medicare Other | Admitting: *Deleted

## 2015-12-18 DIAGNOSIS — J309 Allergic rhinitis, unspecified: Secondary | ICD-10-CM

## 2016-01-01 ENCOUNTER — Ambulatory Visit (INDEPENDENT_AMBULATORY_CARE_PROVIDER_SITE_OTHER): Payer: Medicare Other | Admitting: *Deleted

## 2016-01-01 DIAGNOSIS — J309 Allergic rhinitis, unspecified: Secondary | ICD-10-CM

## 2016-01-15 ENCOUNTER — Ambulatory Visit (INDEPENDENT_AMBULATORY_CARE_PROVIDER_SITE_OTHER): Payer: Medicare Other | Admitting: Internal Medicine

## 2016-01-15 ENCOUNTER — Ambulatory Visit (INDEPENDENT_AMBULATORY_CARE_PROVIDER_SITE_OTHER): Payer: Medicare Other | Admitting: *Deleted

## 2016-01-15 ENCOUNTER — Encounter: Payer: Self-pay | Admitting: Internal Medicine

## 2016-01-15 VITALS — BP 126/70 | HR 70 | Ht 59.0 in | Wt 168.6 lb

## 2016-01-15 DIAGNOSIS — R059 Cough, unspecified: Secondary | ICD-10-CM

## 2016-01-15 DIAGNOSIS — J309 Allergic rhinitis, unspecified: Secondary | ICD-10-CM | POA: Diagnosis not present

## 2016-01-15 DIAGNOSIS — J45909 Unspecified asthma, uncomplicated: Secondary | ICD-10-CM | POA: Diagnosis not present

## 2016-01-15 DIAGNOSIS — I442 Atrioventricular block, complete: Secondary | ICD-10-CM | POA: Diagnosis not present

## 2016-01-15 DIAGNOSIS — R05 Cough: Secondary | ICD-10-CM

## 2016-01-15 MED ORDER — UMECLIDINIUM BROMIDE 62.5 MCG/INH IN AEPB: 1.0000 | INHALATION_SPRAY | Freq: Every day | RESPIRATORY_TRACT | 0 refills | Status: AC

## 2016-01-15 NOTE — Patient Instructions (Addendum)
Sample Incruse Ellipta inhaler   Inhale 1 puff, once daily    See if if helps the cough  Order- schedule [facility] PFT lab- Methacholine Inhalation Challenge test    Dx chronic cough

## 2016-01-15 NOTE — Progress Notes (Signed)
Patient ID: [name], female   DOB: 19-Sep-1931, 82 y.o.   MRN: [id number] Patient seen in the office today and instructed on use of incruse ellipta .  Patient expressed understanding and demonstrated technique.

## 2016-01-15 NOTE — Progress Notes (Signed)
Patient ID: [name], female    DOB: 07-23-1931, 82 y.o.   MRN: [id number]  HPI 02/23/11- 61 yoF never smoker followed for asthma, allergic rhinitis, cough, complicated by GERD .   [id number]- 83yoF never smoker followed for asthma, allergic rhinitis, cough, complicated by GERD, CHB/ pacer,  Allergy vaccine 1:50 GH LOV we added gabapentin for cough, suggested lasix 20-40 mg for edema, BNP was 101  FOLLOWS FOR: pt c/o fatigue, prod cough with unknown color.  pt states her cough is baseline.  tolerating allergy vaccines well, does note a cough after getting her injections.  Complains of feeling tired and admits the problem is that she stays up too late. Naps easily during the day. Still occasional dry cough. Less ankle edema. Took gabapentin in the past but doesn't remember it as helpful for cough. CTa chest 10/17/14-I reviewed IMPRESSION: 1. No evidence of pulmonary embolus. 2. Minimal bilateral atelectasis noted; lungs otherwise clear. 3. Scattered bilateral renal cysts seen. Electronically Signed  By: Garald Balding M.D.  On: 10/17/2014 01:01  09/11/2015-82 year old female never smoker followed for asthma, allergic rhinitis, cough, complicated by GERD, CHB/pacer Allergy vaccine 1:500 GH FOLLOWS FOR: Pt continues allergy injections every other week; pt states she has had cough for a long time and just unable to get rid of it. She continues on beta blocker and ARB, Protonix for GERD Allergy vaccine is held at 1:500 [place], highest tolerated dose. No change in her chronic cough which varies some but is not clearly seasonal or exposure related. Occasional mild wheeze with exertion only.  01/15/2016-82 year old female never smoker followed for asthma, allergic rhinitis, cough, complicated by GERD, CHB/pacemaker Allergy Vaccine 1:500 GH FOLLOWS FOR: Pt still on allergy vaccine and doing well; Review PFT with patient. Pt states she is doing fair;just not alot of energy. PFT 12/02/2015-minimal  obstruction, minimal restriction, minimal diffusion reduction, insignificant response to bronchodilator although small airway flows improved. She reports occasional mild wheeze. More cough and raspy breathing on first waking in the morning. Says she feels it all in her throat. Frequent cough drops. Little sputum. No chest pain or fever. Albuterol inhaler makes her shaky. Has seen ENT.  ROS-see HPI Constitutional:   No-   weight loss, night sweats, fevers, chills, fatigue, lassitude. HEENT:   No-  headaches, difficulty swallowing, tooth/dental problems, sore throat,       No-  sneezing, itching, ear ache, nasal congestion, post nasal drip,  CV:  No-   chest pain, orthopnea, PND, swelling in lower extremities, anasarca,  dizziness, palpitations Resp: No-   shortness of breath with exertion or at rest.              No-   productive cough,  + non-productive cough,  No- coughing up of blood.              No-   change in color of mucus.  Skin: No-   rash or lesions. GI:  No-   , abdominal pain, nausea, vomiting, GU:  MS:  No-   joint pain or swelling.   Neuro-     nothing unusual Psych:  No- change in mood or affect. No depression or anxiety.  No memory loss.  OBJ- Physical Exam General- Alert, Oriented, Affect-appropriate, Distress- none acute, + overweight Skin- rash-none, lesions- none, excoriation- none Lymphadenopathy- none Head- atraumatic            Eyes- Gross vision intact, PERRLA, conjunctivae and secretions clear  Ears- Hearing, canals-normal            Nose- Clear, no-Septal dev, mucus, polyps, erosion, perforation             Throat- Mallampati II , mucosa clear , drainage- none, tonsils- atrophic. Not hoarse Neck- flexible , trachea midline, no stridor , thyroid nl, carotid no bruit Chest - symmetrical excursion , unlabored           Heart/CV- RRR , no murmur , no gallop  , no rub, nl s1 s2                           - JVD- none , edema- none, stasis changes- none,  varices- none           Lung- clear to P&A, wheeze + slight,  cough + dry , dullness-none, rub- none           Chest wall- + right chest pacemaker Abd-  Br/ Gen/ Rectal- Not done, not indicated Extrem- cyanosis- none, clubbing, none, atrophy- none, strength- nl Neuro- grossly intact to observation

## 2016-01-16 DIAGNOSIS — H26492 Other secondary cataract, left eye: Secondary | ICD-10-CM | POA: Diagnosis not present

## 2016-01-19 ENCOUNTER — Other Ambulatory Visit: Payer: Self-pay | Admitting: Cardiovascular Disease

## 2016-01-19 ENCOUNTER — Telehealth: Payer: Self-pay | Admitting: Internal Medicine

## 2016-01-19 DIAGNOSIS — J309 Allergic rhinitis, unspecified: Secondary | ICD-10-CM | POA: Diagnosis not present

## 2016-01-19 NOTE — Telephone Encounter (Signed)
REFILL 

## 2016-01-19 NOTE — Telephone Encounter (Signed)
Allergy Serum Extract Date Mixed: 01/19/16 Vial: 2 Strength: 1:500 Here/Mail/Pick Up: here Mixed By: tbs Last OV: 01/15/16 Pending OV: N/A

## 2016-01-22 ENCOUNTER — Encounter (HOSPITAL_COMMUNITY): Payer: Medicare Other

## 2016-01-29 ENCOUNTER — Ambulatory Visit (INDEPENDENT_AMBULATORY_CARE_PROVIDER_SITE_OTHER): Payer: Medicare Other | Admitting: *Deleted

## 2016-01-29 DIAGNOSIS — J309 Allergic rhinitis, unspecified: Secondary | ICD-10-CM

## 2016-01-29 NOTE — Progress Notes (Signed)
Pt. Does get 2 shots instead of one.

## 2016-01-30 ENCOUNTER — Ambulatory Visit (HOSPITAL_COMMUNITY)
Admission: RE | Admit: 2016-01-30 | Discharge: 2016-01-30 | Disposition: A | Discharge status: Home / Self Care | Payer: Medicare Other | Source: Ambulatory Visit | Attending: Internal Medicine | Admitting: Internal Medicine

## 2016-01-30 DIAGNOSIS — R05 Cough: Secondary | ICD-10-CM | POA: Insufficient documentation

## 2016-01-30 DIAGNOSIS — R059 Cough, unspecified: Secondary | ICD-10-CM

## 2016-01-30 LAB — PULMONARY FUNCTION TEST
FEF 25-75 PRE: 1.05 L/s
FEF 25-75 Post: 1.02 L/sec
FEF2575-%CHANGE-POST: -2 %
FEF2575-%Pred-Post: 105 %
FEF2575-%Pred-Pre: 108 %
FEV1-%Change-Post: -1 %
FEV1-%PRED-PRE: 89 %
FEV1-%Pred-Post: 87 %
FEV1-POST: 1.16 L
FEV1-PRE: 1.18 L
FEV1FVC-%CHANGE-POST: -4 %
FEV1FVC-%Pred-Pre: 104 %
FEV6-%CHANGE-POST: -2 %
FEV6-%PRED-PRE: 91 %
FEV6-%Pred-Post: 89 %
FEV6-POST: 1.51 L
FEV6-PRE: 1.54 L
FEV6FVC-%Change-Post: 0 %
FEV6FVC-%PRED-PRE: 107 %
FEV6FVC-%Pred-Post: 106 %
FVC-%Change-Post: 2 %
FVC-%Pred-Post: 87 %
FVC-%Pred-Pre: 85 %
FVC-POST: 1.59 L
FVC-Pre: 1.55 L
POST FEV6/FVC RATIO: 99 %
Post FEV1/FVC ratio: 73 %
Pre FEV1/FVC ratio: 76 %
Pre FEV6/FVC Ratio: 100 %

## 2016-01-30 MED ORDER — SODIUM CHLORIDE 0.9 % IN NEBU
3.0000 mL | INHALATION_SOLUTION | Freq: Once | RESPIRATORY_TRACT | Status: AC
  Administered 2016-01-30: 3 mL via RESPIRATORY_TRACT

## 2016-01-30 MED ORDER — METHACHOLINE 4 MG/ML NEB SOLN: 2.0000 mL | Freq: Once | RESPIRATORY_TRACT | Status: DC

## 2016-01-30 MED ORDER — METHACHOLINE 1 MG/ML NEB SOLN
2.0000 mL | Freq: Once | RESPIRATORY_TRACT | Status: AC
  Administered 2016-01-30: 2 mg via RESPIRATORY_TRACT

## 2016-01-30 MED ORDER — METHACHOLINE 0.0625 MG/ML NEB SOLN
2.0000 mL | Freq: Once | RESPIRATORY_TRACT | Status: AC
  Administered 2016-01-30: 0.125 mg via RESPIRATORY_TRACT

## 2016-01-30 MED ORDER — ALBUTEROL SULFATE (2.5 MG/3ML) 0.083% IN NEBU
2.5000 mg | INHALATION_SOLUTION | Freq: Once | RESPIRATORY_TRACT | Status: AC
  Administered 2016-01-30: 2.5 mg via RESPIRATORY_TRACT

## 2016-01-30 MED ORDER — METHACHOLINE 0.25 MG/ML NEB SOLN
2.0000 mL | Freq: Once | RESPIRATORY_TRACT | Status: AC
  Administered 2016-01-30: 0.5 mg via RESPIRATORY_TRACT

## 2016-01-30 MED ORDER — METHACHOLINE 16 MG/ML NEB SOLN: 2.0000 mL | Freq: Once | RESPIRATORY_TRACT | Status: DC

## 2016-02-05 ENCOUNTER — Telehealth: Payer: Self-pay | Admitting: Internal Medicine

## 2016-02-05 NOTE — Telephone Encounter (Signed)
Pt is requesting results from her PFT done on 01/30/16.  CY - please advise. Thanks.

## 2016-02-05 NOTE — Telephone Encounter (Signed)
Called and lmom to make the pt aware of CY recs from the methacholine challenge test.  I left a message on the VM to make the pt aware. Nothing further is needed.

## 2016-02-05 NOTE — Telephone Encounter (Signed)
The methacholine inhalation challenge test was positive, meaning that her lungs react like someone with asthma. We will be treating for asthma, to see if it helps her cough.

## 2016-02-08 NOTE — Assessment & Plan Note (Signed)
Heart rhythm is regular consistent with pacemaker. Followed by cardiology.

## 2016-02-08 NOTE — Assessment & Plan Note (Signed)
Very minimal persistent asthma could fit this pattern. PFTs are only a couple of points for normal Plan-methacholine inhalation challenge test, try LAMA- Incruse

## 2016-02-08 NOTE — Assessment & Plan Note (Signed)
We have felt there was at least a component of cough equivalent asthma. Plan- Try Incruse as discusxed

## 2016-02-12 ENCOUNTER — Ambulatory Visit (INDEPENDENT_AMBULATORY_CARE_PROVIDER_SITE_OTHER): Payer: Medicare Other | Admitting: *Deleted

## 2016-02-12 DIAGNOSIS — J309 Allergic rhinitis, unspecified: Secondary | ICD-10-CM

## 2016-02-24 ENCOUNTER — Telehealth: Payer: Self-pay | Admitting: Internal Medicine

## 2016-02-24 NOTE — Telephone Encounter (Signed)
LM for pt x 1  

## 2016-02-25 NOTE — Telephone Encounter (Signed)
lmtcb x2 for pt. 

## 2016-02-25 NOTE — Telephone Encounter (Signed)
Pt states she just has question about her breathing test-states CY told her the test shows her lungs act as though she has asthma. She would like to know how this will affect her allergies.    Pt states she also has throat issues(all her life) was given inhaler(incruse) but has not used as she is scared to use them due to throat issues and closing up. Pt wanted to know when she should take it and how often. Pt was made aware that she should take Incruse daily and yes she should take it as CY feels that it will help her lungs.    Pt would like to get Rx for cough syrup or something to use OTC that will not make her sleepy. Pt wakes up with raspy voice and sore throat.   Also, if this is not asthma then what is it?  Should patient follow up in Jan 2018?   Pt is aware that CY is back in the office on 02-26-16 and will address then.

## 2016-02-25 NOTE — Telephone Encounter (Signed)
Patient returning our call - She can be reached at [phone]

## 2016-02-26 ENCOUNTER — Ambulatory Visit (INDEPENDENT_AMBULATORY_CARE_PROVIDER_SITE_OTHER): Payer: Medicare Other | Admitting: *Deleted

## 2016-02-26 DIAGNOSIS — J309 Allergic rhinitis, unspecified: Secondary | ICD-10-CM

## 2016-02-26 NOTE — Telephone Encounter (Signed)
lmomtcb x1 for pt 

## 2016-02-26 NOTE — Telephone Encounter (Signed)
Asthma is a condition of irritable twitchy airways which can come from nay different causes.  Allergies can be one of the triggers for asthma.  Suggest she try the Incruse inhaler, 1 deep breath, once daily  Offer Tessalon perles 200 mg, # 30, 1 every 8 hours as needed for cough

## 2016-02-27 MED ORDER — BENZONATATE 200 MG PO CAPS: 200.0000 mg | ORAL_CAPSULE | Freq: Three times a day (TID) | ORAL | 0 refills | Status: DC | PRN

## 2016-02-27 NOTE — Telephone Encounter (Signed)
Called spoke with patient and discussed CY's recommendations with her as stated below.  Pt okay with CY's recs and reported that she will begin the Incruse (she has yet to begin using it from her 01/15/16 visit).  Pt requested Rx for Tessalon be sent to [facility].  Pt is aware to contact the office prior to her January 2018 appt if needed.  Nothing further needed at this time, will sign off.

## 2016-02-27 NOTE — Telephone Encounter (Signed)
[phone] pt calling back

## 2016-03-03 ENCOUNTER — Ambulatory Visit: Payer: Medicare Other

## 2016-03-05 ENCOUNTER — Other Ambulatory Visit: Payer: Self-pay | Admitting: Cardiovascular Disease

## 2016-03-08 ENCOUNTER — Ambulatory Visit (INDEPENDENT_AMBULATORY_CARE_PROVIDER_SITE_OTHER): Payer: Medicare Other

## 2016-03-08 ENCOUNTER — Telehealth: Payer: Self-pay | Admitting: Internal Medicine

## 2016-03-08 DIAGNOSIS — E7800 Pure hypercholesterolemia, unspecified: Secondary | ICD-10-CM | POA: Diagnosis not present

## 2016-03-08 DIAGNOSIS — I10 Essential (primary) hypertension: Secondary | ICD-10-CM | POA: Diagnosis not present

## 2016-03-08 DIAGNOSIS — E559 Vitamin D deficiency, unspecified: Secondary | ICD-10-CM | POA: Diagnosis not present

## 2016-03-08 DIAGNOSIS — E038 Other specified hypothyroidism: Secondary | ICD-10-CM | POA: Diagnosis not present

## 2016-03-08 DIAGNOSIS — Z23 Encounter for immunization: Secondary | ICD-10-CM

## 2016-03-08 DIAGNOSIS — R7301 Impaired fasting glucose: Secondary | ICD-10-CM | POA: Diagnosis not present

## 2016-03-08 MED ORDER — UMECLIDINIUM BROMIDE 62.5 MCG/INH IN AEPB: 1.0000 | INHALATION_SPRAY | Freq: Every day | RESPIRATORY_TRACT | 11 refills | Status: DC

## 2016-03-08 NOTE — Telephone Encounter (Signed)
Lm x 1 

## 2016-03-08 NOTE — Telephone Encounter (Signed)
As I slow down, it will be hard to get her in on short notice if she needs a treatment.  It would be much better to order her a DME supplied nebulizer compressor and albuterol 0.083 % neb solution, # 75 ,l             1 neb every 6 hors if needed, refill x 12    For dx  Asthma moderate intermittent

## 2016-03-08 NOTE — Telephone Encounter (Signed)
Pt returning call again.[name] ° °

## 2016-03-08 NOTE — Telephone Encounter (Signed)
Spoke with the pt  She states that Incruse did help with her cough and she wants rx called in  Rx sent to pharm and nothing further needed per pt

## 2016-03-08 NOTE — Telephone Encounter (Signed)
Pt returning call.[name] ° °

## 2016-03-08 NOTE — Telephone Encounter (Signed)
Spoke with pt and she states that she is not sure the Incruse is helping. Her sample is almost gone. She asked if it would be possible for her to come in to the office to get "inhalation therapy" when she needs it. I asked pt if she meant a nebulizer treatment and she said yes. She states that these work better for her than the inhalers, mainly because she does not like to use inhalers on regular basis.   CY - Please advise. Thanks!   Allergies  Allergen Reactions  . Erythromycin Nausea And Vomiting and Other (See Comments)    Stomach cramps  . Fluticasone-Salmeterol Other (See Comments)    unknown  . Sulfonamide Derivatives Other (See Comments)    unknown    Current Outpatient Prescriptions on File Prior to Visit  Medication Sig Dispense Refill  . aspirin 81 MG tablet Take 81 mg by mouth daily.      . benzonatate (TESSALON) 200 MG capsule Take 1 capsule (200 mg total) by mouth every 8 (eight) hours as needed for cough. 30 capsule 0  . cholecalciferol (VITAMIN D) 1000 UNITS tablet Take 1,000 Units by mouth daily.      Marland Kitchen diltiazem (CARDIZEM CD) 180 MG 24 hr capsule Take 1 capsule (180 mg total) by mouth daily. 30 capsule 11  . LORazepam (ATIVAN) 0.5 MG tablet Take 0.5 mg by mouth 2 (two) times daily as needed for anxiety.     . metoprolol succinate (TOPROL-XL) 50 MG 24 hr tablet Take 0.5 tablet (25 mg total) by mouth once daily. Take with or immediately following a meal. 60 tablet 6  . NON FORMULARY Allergy vaccines 1:500 biweekly GH    . pantoprazole (PROTONIX) 40 MG tablet Take 1 tablet by mouth daily before breakfast.    . pravastatin (PRAVACHOL) 40 MG tablet TAKE 1 TABLET (40 MG TOTAL) BY MOUTH EVERY EVENING. 90 tablet 2  . SYNTHROID 25 MCG tablet Take 1 tablet by mouth daily.  12  . valsartan-hydrochlorothiazide (DIOVAN-HCT) 160-25 MG tablet TAKE 1 TABLET BY MOUTH DAILY. 30 tablet 6   No current facility-administered medications on file prior to visit.

## 2016-03-08 NOTE — Telephone Encounter (Signed)
lmtcb x1 for pt. 

## 2016-03-11 ENCOUNTER — Ambulatory Visit (INDEPENDENT_AMBULATORY_CARE_PROVIDER_SITE_OTHER): Payer: Medicare Other

## 2016-03-11 DIAGNOSIS — I10 Essential (primary) hypertension: Secondary | ICD-10-CM | POA: Diagnosis not present

## 2016-03-11 DIAGNOSIS — R7301 Impaired fasting glucose: Secondary | ICD-10-CM | POA: Diagnosis not present

## 2016-03-11 DIAGNOSIS — M8589 Other specified disorders of bone density and structure, multiple sites: Secondary | ICD-10-CM | POA: Diagnosis not present

## 2016-03-11 DIAGNOSIS — R5382 Chronic fatigue, unspecified: Secondary | ICD-10-CM | POA: Diagnosis not present

## 2016-03-11 DIAGNOSIS — J309 Allergic rhinitis, unspecified: Secondary | ICD-10-CM

## 2016-03-11 DIAGNOSIS — E038 Other specified hypothyroidism: Secondary | ICD-10-CM | POA: Diagnosis not present

## 2016-03-11 DIAGNOSIS — E559 Vitamin D deficiency, unspecified: Secondary | ICD-10-CM | POA: Diagnosis not present

## 2016-03-11 DIAGNOSIS — E7800 Pure hypercholesterolemia, unspecified: Secondary | ICD-10-CM | POA: Diagnosis not present

## 2016-03-15 ENCOUNTER — Telehealth: Payer: Self-pay | Admitting: Cardiovascular Disease

## 2016-03-15 NOTE — Telephone Encounter (Signed)
Records received from [facility] for apt on 03/31/16 with T. Claiborne Billings. Records given to [employer] (medical records) CN

## 2016-03-25 ENCOUNTER — Ambulatory Visit (INDEPENDENT_AMBULATORY_CARE_PROVIDER_SITE_OTHER): Payer: Medicare Other | Admitting: *Deleted

## 2016-03-25 DIAGNOSIS — J309 Allergic rhinitis, unspecified: Secondary | ICD-10-CM

## 2016-03-31 ENCOUNTER — Ambulatory Visit (INDEPENDENT_AMBULATORY_CARE_PROVIDER_SITE_OTHER): Payer: Medicare Other | Admitting: Cardiovascular Disease

## 2016-03-31 ENCOUNTER — Encounter: Payer: Self-pay | Admitting: Cardiovascular Disease

## 2016-03-31 VITALS — BP 118/62 | HR 70 | Ht 59.0 in | Wt 165.0 lb

## 2016-03-31 DIAGNOSIS — I480 Paroxysmal atrial fibrillation: Secondary | ICD-10-CM

## 2016-03-31 DIAGNOSIS — Z950 Presence of cardiac pacemaker: Secondary | ICD-10-CM

## 2016-03-31 DIAGNOSIS — I10 Essential (primary) hypertension: Secondary | ICD-10-CM

## 2016-03-31 DIAGNOSIS — R5383 Other fatigue: Secondary | ICD-10-CM

## 2016-03-31 DIAGNOSIS — I442 Atrioventricular block, complete: Secondary | ICD-10-CM

## 2016-03-31 DIAGNOSIS — E7800 Pure hypercholesterolemia, unspecified: Secondary | ICD-10-CM

## 2016-03-31 NOTE — Progress Notes (Signed)
Patient ID: [name], female   DOB: 01/15/1932, 82 y.o.   MRN: [id number]    Primary MD: Dr. Michael Altheimer  HPI: [name] is a 82 y.o. female who presents to the office today for a 10 month cardiology evaluation.  [name] underwent dual-chamber pacemaker implantation for complete heart block. She has a history of paroxysmal atrial fibrillation, a history of lower extremity edema as well as GERD. She also has a history of hypertension as well as hyperlipidemia.   A follow-up echo Doppler study on 10/23/2014 revealed an ejection fraction in the 50-55% range with grade 1 diastolic dysfunction and mild mitral regurgitation. Her last pacemaker interrogation was done in June 2017 by Dr. Croitoru.  At that time, she had normal pacemaker function and had 97% atrial pacing and 100% ventricular pacing.  There was no underlying escape rhythm.  Estimated longevity was 1.75-2.25 years.  Since I last saw her,  she continues to do well and is without chest pain.  She notes occasional fatigue.  Typically she goes to bed at midnight and wakes up at 6 AM.  She experiences a dry cough and is unaware of any postnasal drip.  She has a history of GERD.  She denies recent wheezing. She denies presyncope or syncope. At times she admits to intermittent leg swelling. She has a history of hypothyroidism on thyroid replacement.  She sees Dr. Altheimer for primary and endocrinologic care.  I reviewed laboratory recently done by him.  She presents for evaluation  Past Medical History:  Diagnosis Date  . Allergic rhinitis   . Asthmatic bronchitis   . Chronic cough   . Esophageal reflux   . GERD (gastroesophageal reflux disease)   . History of stress test 11/20/2009   This was essentially normal,post-stress EF was hyperdynamic at 73 beats per mins, There was no scar or ischemia.  . Hx of echocardiogram 11/05/2010   EF>55% showed a normal systolic function with impaired diastolic dysfuntion and also tissue  doppler suggesting increased elevated left atrial pressure, she had moderate LA dilatation and mild LA dilatation. There was calcified mitral apparatus with moderate MR, mild to moderate TR and trace aortic insufficiency.  . Hyperlipemia   . Hypertension   . PAF (paroxysmal atrial fibrillation) (HCC)     Past Surgical History:  Procedure Laterality Date  . CHOLECYSTECTOMY    . FOOT SURGERY     right  . NASAL SEPTUM SURGERY    . PACEMAKER INSERTION  June 1st 2009   St Jude Zephyr device, model #5826, serial #[id number], The artial lead is a St Jude 1642T, the ventricular lead is St Jude 1688TC.  . THYROID SURGERY    . TONSILLECTOMY    . VESICOVAGINAL FISTULA CLOSURE W/ TAH      Allergies  Allergen Reactions  . Erythromycin Nausea And Vomiting and Other (See Comments)    Stomach cramps  . Fluticasone-Salmeterol Other (See Comments)    unknown  . Sulfonamide Derivatives Other (See Comments)    unknown    Current Outpatient Prescriptions  Medication Sig Dispense Refill  . aspirin 81 MG tablet Take 81 mg by mouth daily.      . cholecalciferol (VITAMIN D) 1000 UNITS tablet Take 1,000 Units by mouth daily.      . diltiazem (CARDIZEM CD) 180 MG 24 hr capsule Take 1 capsule (180 mg total) by mouth daily. 30 capsule 11  . LORazepam (ATIVAN) 0.5 MG tablet Take 0.5 mg by mouth 2 (two) times daily   as needed for anxiety.     . metoprolol succinate (TOPROL-XL) 50 MG 24 hr tablet Take 0.5 tablet (25 mg total) by mouth once daily. Take with or immediately following a meal. 60 tablet 6  . MULTIPLE VITAMIN PO Take 1 tablet by mouth daily.    . NON FORMULARY Allergy vaccines 1:500 biweekly GH    . pantoprazole (PROTONIX) 40 MG tablet Take 1 tablet by mouth daily before breakfast.    . pravastatin (PRAVACHOL) 40 MG tablet TAKE 1 TABLET (40 MG TOTAL) BY MOUTH EVERY EVENING. 90 tablet 2  . SYNTHROID 25 MCG tablet Take 1 tablet by mouth daily.  12  . valsartan-hydrochlorothiazide (DIOVAN-HCT) 160-25 MG  tablet TAKE 1 TABLET BY MOUTH DAILY. 30 tablet 6  . umeclidinium bromide (INCRUSE ELLIPTA) 62.5 MCG/INH AEPB Inhale 1 puff into the lungs daily. (Patient not taking: Reported on 03/31/2016) 60 each 11   No current facility-administered medications for this visit.     Social History   Social History  . Marital status: Married    Spouse name: N/A  . Number of children: N/A  . Years of education: N/A   Occupational History  .  Retired    Retired Sales promotion account executive at [employer]  . Smoking status: Never Smoker  . Smokeless tobacco: Never Used  . Alcohol use No  . Drug use: No  . Sexual activity: Not on file   Other Topics Concern  . Not on file   Social History Narrative  . No narrative on file    Family History  Problem Relation Age of Onset  . Stroke Mother   . Cancer Father     bladder  . Stroke Sister    Additional social history is notable that she is widowed and has one daughter and 2 sons.  ROS General: Negative; No fevers, chills, or night sweats;  HEENT: Negative; No changes in vision or hearing, sinus congestion, difficulty swallowing Pulmonary: Negative; No cough, wheezing, shortness of breath, hemoptysis Cardiovascular: Negative; No chest pain, presyncope, syncope, palpitations.  Positive for leg swelling GI: Positive for GERD, controlled with Nexium; No nausea, vomiting, diarrhea, or abdominal pain GU: Negative; No dysuria, hematuria, or difficulty voiding Musculoskeletal: Negative; no myalgias, joint pain, or weakness Hematologic/Oncology: Negative; no easy bruising, bleeding Endocrine: Negative; no heat/cold intolerance; no diabetes Neuro: Negative; no changes in balance, headaches Skin: Negative; No rashes or skin lesions Psychiatric: Negative; No behavioral problems, depression Sleep: Negative; No snoring, daytime sleepiness, hypersomnolence, bruxism, restless legs, hypnogognic hallucinations, no cataplexy Other  comprehensive 14 point system review is negative   PE BP 118/62   Pulse 70   Ht 4' 11" (1.499 m)   Wt 165 lb (74.8 kg)   BMI 33.33 kg/m   Wt Readings from Last 3 Encounters:  03/31/16 165 lb (74.8 kg)  01/15/16 168 lb 9.6 oz (76.5 kg)  11/26/15 165 lb 3.2 oz (74.9 kg)   General: Alert, oriented, no distress.  Skin: normal turgor, no rashes HEENT: Normocephalic, atraumatic. Pupils round and reactive; sclera anicteric;no lid lag.  Nose without nasal septal hypertrophy Mouth/Parynx benign; Mallinpatti scale 2 Neck: No JVD, no carotid bruits with normal carotid upstroke Lungs: clear to ausculatation and percussion; no wheezing or rales Chest wall: Nontender to palpation Heart: RRR, s1 s2 normal 1/6 systolic murmur; no diastolic murmur.  No S3 or S4 gallop.  No rubs, thrills or heaves. Abdomen: Mild central adiposity; soft, nontender; no hepatosplenomehaly, BS+; abdominal aorta  nontender and not dilated by palpation. Back: No CVA Pulses 2+ Extremities: Trace edema;    no clubbing cyanosis, Homan's sign negative  Neurologic: grossly nonfocal; cranial nerves grossly normal Psychological: Normal affect and mood appear normal.  Cognitive function normal.  ECG (independently read by me): AV paced rhythm with 100% pacing of both chambers.  PR interval 226 ms.    December 2016 ECG (independently read by me):  100% AV pacing with ventricular rate at 70 and prolonged AV conduction at 224 ms.  June 2016ECG (independently read by me): AV sequentially paced rhythm with 100% capture.  Ventricular rate 70 beats per minute.  PR interval 224 ms.  August 2014 ECG: 80 paced rhythm at 70 beats per minute. PR interval 222 ms.  LABS: I personally reviewed blood work done by Dr. Elyse Hsu from 12/03/2015, and most recently 03/08/2016.  Total cholesterol was 155, triglycerides 120, LDL 70, HDL 61.  Creatinine 1.03.  Glucose 111.  Hemoglobin A1c 5.9.  TSH 4.85.  Vitamin D 38.  Free T4 1 0.06.  BMP  Latest Ref Rng & Units 10/16/2014 10/15/2014 11/28/2007  Glucose 70 - 99 mg/dL 120(H) 107(H) 154(H)  BUN 6 - 23 mg/dL _0 Creatinine 0.50 - 1.10 mg/dL 1.14(H) 0.83 0.96  Sodium 135 - 145 mmol/L 136 136 139  Potassium 3.5 - 5.1 mmol/L 3.7 3.9 3.8  Chloride 96 - 112 mmol/L 99 99 104  CO2 19 - 32 mmol/L _1 Calcium 8.4 - 10.5 mg/dL 9.0 8.9 8.7   Hepatic Function Latest Ref Rng & Units 10/16/2014 11/28/2007 11/24/2007  Total Protein 6.0 - 8.3 g/dL 6.6 5.9(L) 5.8(L)  Albumin 3.5 - 5.2 g/dL 3.6 2.6(L) 2.8(L)  AST 0 - 37 U/L 24 21 77(H)  ALT 0 - 35 U/L 21 29 85(H)  Alk Phosphatase 39 - 117 U/L 72 61 75  Total Bilirubin 0.3 - 1.2 mg/dL 0.3 0.4 0.4   CBC Latest Ref Rng & Units 10/16/2014 11/28/2007 11/26/2007  WBC 4.0 - 10.5 K/uL 7.3 8.7 10.6(H)  Hemoglobin 12.0 - 15.0 g/dL 11.0(L) 9.7(L) 10.8(L)  Hematocrit 36.0 - 46.0 % 33.3(L) 28.5(L) 31.6(L)  Platelets 150 - 400 K/uL 216 183 170   Lab Results  Component Value Date   MCV 91.0 10/16/2014   MCV 91.2 11/28/2007   MCV 91.3 11/26/2007   Lipid Panel     Component Value Date/Time   CHOL 136 03/13/2015 1436   TRIG 95 03/13/2015 1436   HDL 57 03/13/2015 1436   CHOLHDL 2.4 03/13/2015 1436   VLDL 19 03/13/2015 1436   LDLCALC 60 03/13/2015 1436  RADIOLOGY: No results found.    ASSESSMENT AND PLAN: Ms. Potocki is an 82 year old female who has a history of hypertension, PAF, dual chamber pacemaker implantation for complete heart block, and intermittent leg edema.  Her blood pressure today is controlled on her medical regimen consisting of valsartan HCT 160/25, Toprol-XL 50 mg and diltiazem 180 mg. her leg edema improved with titration of her HCTZ component to 25 mg.  An echo Doppler study from 10/23/2014 showed an ejection fraction of 50-55% without regional wall motion abnormalities.  There was evidence for grade 1 diastolic dysfunction.  There was mild mitral regurgitation.  She continues to be 100% paced.  I reviewed Dr. Victorino December  assessment.  I will schedule her for follow-up appointment to see him in December.  Once her estimated battery longevity reaches less than one year.  She will need to be seen at  three-month intervals.  She is tolerating her current dose of pravastatin for hyperlipidemia with excellent cholesterol.  Her blood pressure today is stable on her current regimen.  She has GERD which is controlled with pantoprazole.  She is on low-dose Synthroid at 25 g.  She also tells me she recently had PFTs.  It is possible that she may have increased upper airway resistance contributed by GERD or perhaps postnasal drip.  I have  suggested a trial of Claritin or Zyrtec and she will continue her pantoprazole.  She admits to fatigue.  She states her sleep is good.  I discussed with her the importance of increased sleep duration of approximately 8 hours since it appears that she is only sleeping 4 proximally 6 hours per night, which may be contributing to her daytime sleepiness and fatigability.  I will see her in March 2018 for follow-up evaluation.  Time spent: 25 minutes  Troy Sine, MD, [facility]  03/31/2016 8:16 PM

## 2016-03-31 NOTE — Patient Instructions (Signed)
Your physician wants you to follow-up in: MARCH 2018. You will receive a reminder letter in the mail two months in advance. If you don't receive a letter, please call our office to schedule the follow-up appointment.  If you need a refill on your cardiac medications before your next appointment, please call your pharmacy.

## 2016-04-01 ENCOUNTER — Telehealth: Payer: Self-pay | Admitting: Internal Medicine

## 2016-04-01 NOTE — Telephone Encounter (Signed)
lmtcb X1 for pt  

## 2016-04-02 NOTE — Telephone Encounter (Signed)
Called and spoke with pt and she is aware CY recs.  Nothing further is needed.

## 2016-04-02 NOTE — Telephone Encounter (Signed)
Pt said that it would be ok to leave msg.[name]

## 2016-04-02 NOTE — Telephone Encounter (Signed)
Pt returning call.[name] ° °

## 2016-04-02 NOTE — Telephone Encounter (Signed)
No- if Incruse didn't make her better, then we should just stop using it and watch to see how she does.

## 2016-04-02 NOTE — Telephone Encounter (Signed)
Called and spoke with pt and she stated that she dropped off a letter for CY.  She has stopped using the incruse due to irritation in her throat.  She stated that since she has stopped the Incruse that this irritation has gotten better.  She is wanting to know if CY recs anything further .  CY please advise. Thanks  Last ov--01/15/16 Next ov--07/22/16  Allergies  Allergen Reactions  . Erythromycin Nausea And Vomiting and Other (See Comments)    Stomach cramps  . Fluticasone-Salmeterol Other (See Comments)    unknown  . Sulfonamide Derivatives Other (See Comments)    unknown

## 2016-04-08 ENCOUNTER — Ambulatory Visit (INDEPENDENT_AMBULATORY_CARE_PROVIDER_SITE_OTHER): Payer: Medicare Other | Admitting: *Deleted

## 2016-04-08 DIAGNOSIS — J309 Allergic rhinitis, unspecified: Secondary | ICD-10-CM | POA: Diagnosis not present

## 2016-04-13 NOTE — Addendum Note (Signed)
Addended by: Leland Johns A on: 04/13/2016 05:14 PM   Modules accepted: Orders

## 2016-04-17 ENCOUNTER — Other Ambulatory Visit: Payer: Self-pay | Admitting: Cardiovascular Disease

## 2016-04-22 ENCOUNTER — Ambulatory Visit (INDEPENDENT_AMBULATORY_CARE_PROVIDER_SITE_OTHER): Payer: Medicare Other | Admitting: *Deleted

## 2016-04-22 DIAGNOSIS — J309 Allergic rhinitis, unspecified: Secondary | ICD-10-CM

## 2016-05-06 ENCOUNTER — Ambulatory Visit (INDEPENDENT_AMBULATORY_CARE_PROVIDER_SITE_OTHER): Payer: Medicare Other | Admitting: *Deleted

## 2016-05-06 DIAGNOSIS — J309 Allergic rhinitis, unspecified: Secondary | ICD-10-CM

## 2016-05-20 ENCOUNTER — Ambulatory Visit (INDEPENDENT_AMBULATORY_CARE_PROVIDER_SITE_OTHER): Payer: Medicare Other | Admitting: *Deleted

## 2016-05-20 DIAGNOSIS — J309 Allergic rhinitis, unspecified: Secondary | ICD-10-CM | POA: Diagnosis not present

## 2016-05-20 NOTE — Progress Notes (Signed)
Just charge for [phone]

## 2016-05-28 ENCOUNTER — Other Ambulatory Visit: Payer: Self-pay

## 2016-05-28 MED ORDER — METOPROLOL SUCCINATE ER 50 MG PO TB24: ORAL_TABLET | ORAL | 6 refills | Status: DC

## 2016-06-02 ENCOUNTER — Telehealth: Payer: Self-pay | Admitting: Internal Medicine

## 2016-06-03 ENCOUNTER — Ambulatory Visit: Payer: Medicare Other

## 2016-06-03 MED ORDER — FLUTICASONE-UMECLIDIN-VILANT 100-62.5-25 MCG/INH IN AEPB: 1.0000 | INHALATION_SPRAY | Freq: Every day | RESPIRATORY_TRACT | 0 refills | Status: DC

## 2016-06-03 MED ORDER — PROMETHAZINE-CODEINE 6.25-10 MG/5ML PO SYRP: 5.0000 mL | ORAL_SOLUTION | Freq: Four times a day (QID) | ORAL | 0 refills | Status: DC | PRN

## 2016-06-03 NOTE — Telephone Encounter (Signed)
Spoke with pt. States that she is still having issues with coughing. Reports that the cough is dry but is giving her a "fit." This not something new for her per the pt. Denies any other pulmonary symptoms. Wants CY's recommendations. She also wants to know if CY wants her to continue her allergy shots. CY - please advise. Thanks.  Allergies  Allergen Reactions  . Erythromycin Nausea And Vomiting and Other (See Comments)    Stomach cramps  . Fluticasone-Salmeterol Other (See Comments)    unknown  . Sulfonamide Derivatives Other (See Comments)    unknown   Current Outpatient Prescriptions on File Prior to Visit  Medication Sig Dispense Refill  . aspirin 81 MG tablet Take 81 mg by mouth daily.      . cholecalciferol (VITAMIN D) 1000 UNITS tablet Take 1,000 Units by mouth daily.      Marland Kitchen diltiazem (CARDIZEM CD) 180 MG 24 hr capsule Take 1 capsule (180 mg total) by mouth daily. 30 capsule 11  . LORazepam (ATIVAN) 0.5 MG tablet Take 0.5 mg by mouth 2 (two) times daily as needed for anxiety.     . metoprolol succinate (TOPROL-XL) 50 MG 24 hr tablet Take 0.5 tablet (25 mg total) by mouth once daily. Take with or immediately following a meal. 60 tablet 6  . MULTIPLE VITAMIN PO Take 1 tablet by mouth daily.    . NON FORMULARY Allergy vaccines 1:500 biweekly GH    . pantoprazole (PROTONIX) 40 MG tablet Take 1 tablet by mouth daily before breakfast.    . pravastatin (PRAVACHOL) 40 MG tablet Take 1 tablet (40 mg total) by mouth daily. 90 tablet 3  . SYNTHROID 25 MCG tablet Take 1 tablet by mouth daily.  12  . umeclidinium bromide (INCRUSE ELLIPTA) 62.5 MCG/INH AEPB Inhale 1 puff into the lungs daily. (Patient not taking: Reported on 03/31/2016) 60 each 11  . valsartan-hydrochlorothiazide (DIOVAN-HCT) 160-25 MG tablet TAKE 1 TABLET BY MOUTH DAILY. 30 tablet 6   No current facility-administered medications on file prior to visit.

## 2016-06-03 NOTE — Telephone Encounter (Signed)
lmomtcb x1 

## 2016-06-03 NOTE — Telephone Encounter (Signed)
Pt returning call.Stanley A Dalton ° °

## 2016-06-03 NOTE — Telephone Encounter (Signed)
Spoke with the pt and notified of recs per CDY  She verbalized understanding  Sample up front and pt aware to ask for nurse to show her how to use  Rx for cough syrup called to pharm

## 2016-06-03 NOTE — Telephone Encounter (Signed)
Offer sample trial Trelegy Ellipta inhaler-  Inhale 1 puff, then rinse mouth, once daily    Try this instead of Incruse  Offer Prometh codeine cough syrup 200 ml,     5 ml every 6 hours as needed for cough

## 2016-06-07 ENCOUNTER — Ambulatory Visit: Payer: Medicare Other | Admitting: Podiatry

## 2016-06-10 ENCOUNTER — Ambulatory Visit (INDEPENDENT_AMBULATORY_CARE_PROVIDER_SITE_OTHER): Payer: Medicare Other | Admitting: *Deleted

## 2016-06-10 DIAGNOSIS — J309 Allergic rhinitis, unspecified: Secondary | ICD-10-CM

## 2016-06-17 ENCOUNTER — Other Ambulatory Visit: Payer: Self-pay | Admitting: Cardiovascular Disease

## 2016-06-17 NOTE — Telephone Encounter (Signed)
REFILL 

## 2016-06-30 ENCOUNTER — Ambulatory Visit (INDEPENDENT_AMBULATORY_CARE_PROVIDER_SITE_OTHER): Payer: Medicare Other | Admitting: Cardiovascular Disease

## 2016-06-30 ENCOUNTER — Encounter: Payer: Self-pay | Admitting: Cardiovascular Disease

## 2016-06-30 VITALS — BP 128/58 | HR 70 | Ht 59.0 in | Wt 169.8 lb

## 2016-06-30 DIAGNOSIS — I442 Atrioventricular block, complete: Secondary | ICD-10-CM

## 2016-06-30 DIAGNOSIS — I10 Essential (primary) hypertension: Secondary | ICD-10-CM

## 2016-06-30 DIAGNOSIS — Z950 Presence of cardiac pacemaker: Secondary | ICD-10-CM

## 2016-06-30 DIAGNOSIS — I480 Paroxysmal atrial fibrillation: Secondary | ICD-10-CM

## 2016-06-30 DIAGNOSIS — E7800 Pure hypercholesterolemia, unspecified: Secondary | ICD-10-CM

## 2016-06-30 MED ORDER — METOPROLOL SUCCINATE ER 25 MG PO TB24: 25.0000 mg | ORAL_TABLET | Freq: Every day | ORAL | 11 refills | Status: DC

## 2016-06-30 NOTE — Progress Notes (Signed)
Patient ID: [name], female   DOB: [birth year], 83 y.o.   MRN: [id number] Patient ID: [name], female   DOB: [birth year], 83 y.o.   MRN: [id number]    Cardiology Office Note    Date:  06/30/2016   ID:  [name], DOB 01-07-1932, MRN [id number]  PCP:  Limmie Patricia, MD  Cardiologist: Shelva Majestic, M.D.;  Sanda Klein, MD   Chief Complaint  Patient presents with  . Follow-up    History of Present Illness:  [name] is a 83 y.o. female who returns in follow-up for sinus node dysfunction, complete heart block, paroxysmal atrial fibrillation and pacemaker check.  She has no new cardiac complaints.  She denies exertional angina, exertional dyspnea, palpitations, syncope, focal neurological complaints or claudication. She occasionally has mild ankle swelling that typically resolves overnight. Occasionally has days where she feels tired and weak.  Her St. Jude Zephyr [id number] dual chamber device is functioning normally and shows an anticipated 1-1.25 years of remaining generator longevity. There is 97% A paced and 100% V paced rhythm and there is no underlying escape rhythm. No thickened atrial or ventricular tachyarrhythmia has occurred since last check.  Past Medical History:  Diagnosis Date  . Allergic rhinitis   . Asthmatic bronchitis   . Chronic cough   . Esophageal reflux   . GERD (gastroesophageal reflux disease)   . History of stress test 11/20/2009   This was essentially normal,post-stress EF was hyperdynamic at 73 beats per mins, There was no scar or ischemia.  Marland Kitchen Hx of echocardiogram 11/05/2010   EF>55% showed a normal systolic function with impaired diastolic dysfuntion and also tissue doppler suggesting increased elevated left atrial pressure, she had moderate LA dilatation and mild LA dilatation. There was calcified mitral apparatus with moderate MR, mild to moderate TR and trace aortic insufficiency.  . Hyperlipemia   . Hypertension   . PAF (paroxysmal atrial  fibrillation) ([place])     Past Surgical History:  Procedure Laterality Date  . CHOLECYSTECTOMY    . FOOT SURGERY     right  . NASAL SEPTUM SURGERY    . PACEMAKER INSERTION  June 1st 2009   St Jude Winslow device, model #5826, serial [id number], The artial lead is a Interior and spatial designer, the ventricular lead is Environmental education officer.  . THYROID SURGERY    . TONSILLECTOMY    . VESICOVAGINAL FISTULA CLOSURE W/ TAH      Current Outpatient Prescriptions  Medication Sig Dispense Refill  . aspirin 81 MG tablet Take 81 mg by mouth daily.      . cholecalciferol (VITAMIN D) 1000 UNITS tablet Take 1,000 Units by mouth daily.      Marland Kitchen diltiazem (CARDIZEM CD) 180 MG 24 hr capsule Take 1 capsule (180 mg total) by mouth daily. KEEP OV. 30 capsule 3  . Fluticasone-Umeclidin-Vilant (TRELEGY ELLIPTA) 100-62.5-25 MCG/INH AEPB Inhale 1 puff into the lungs daily. 14 each 0  . LORazepam (ATIVAN) 0.5 MG tablet Take 0.5 mg by mouth 2 (two) times daily as needed for anxiety.     . metoprolol succinate (TOPROL-XL) 25 MG 24 hr tablet Take 1 tablet (25 mg total) by mouth daily. 30 tablet 11  . MULTIPLE VITAMIN PO Take 1 tablet by mouth daily.    . NON FORMULARY Allergy vaccines 1:500 biweekly GH    . pantoprazole (PROTONIX) 40 MG tablet Take 1 tablet by mouth daily before breakfast.    . pravastatin (PRAVACHOL) 40 MG tablet Take 1 tablet (40 mg  total) by mouth daily. 90 tablet 3  . promethazine-codeine (PHENERGAN WITH CODEINE) 6.25-10 MG/5ML syrup Take 5 mLs by mouth every 6 (six) hours as needed for cough. 200 mL 0  . SYNTHROID 25 MCG tablet Take 1 tablet by mouth daily.  12  . valsartan-hydrochlorothiazide (DIOVAN-HCT) 160-25 MG tablet TAKE 1 TABLET BY MOUTH DAILY. 30 tablet 6   No current facility-administered medications for this visit.     Allergies:   Fluticasone-salmeterol; Sulfonamide derivatives; and Erythromycin   Social History   Social History  . Marital status: Married    Spouse name: N/A  . Number of  children: N/A  . Years of education: N/A   Occupational History  .  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 Smoking status: Never Smoker  . Smokeless tobacco: Never Used  . Alcohol use No  . Drug use: No  . Sexual activity: Not Asked   Other Topics Concern  . None   Social History Narrative  . None     Family History:  The patient's family history includes Cancer in her father; Stroke in her mother and sister.   ROS:   Please see the history of present illness.    Review of Systems  Cardiovascular: Positive for dyspnea on exertion.  Musculoskeletal: Positive for arthritis, back pain, muscle cramps and stiffness.   All other systems reviewed and are negative.   PHYSICAL EXAM:   VS:  BP (!) 128/58   Pulse 70   Ht 4\' 11"  (1.499 m)   Wt 169 lb 12.8 oz (77 kg)   BMI 34.30 kg/m    GEN: Well nourished, well developed, in no acute distress  HEENT: normal  Neck: no JVD, carotid bruits, or masses Cardiac: RRR; paradoxically split second heart sound, no murmurs, rubs, or gallops,no edema ; healthy pacemaker site Respiratory:  clear to auscultation bilaterally, normal work of breathing GI: soft, nontender, nondistended, + BS MS: no deformity or atrophy  Skin: warm and dry, no rash Neuro:  Alert and Oriented x 3, Strength and sensation are intact Psych: euthymic mood, full affect  Wt Readings from Last 3 Encounters:  06/30/16 169 lb 12.8 oz (77 kg)  03/31/16 165 lb (74.8 kg)  01/15/16 168 lb 9.6 oz (76.5 kg)      Studies/Labs Reviewed:   EKG:  EKG is not ordered today.  The ekg ordered December 1 demonstrates 100% AV sequential pacing  Recent Labs: No results found for requested labs within last 8760 hours.   Lipid Panel    Component Value Date/Time   CHOL 136 03/13/2015 1436   TRIG 95 03/13/2015 1436   HDL 57 03/13/2015 1436   CHOLHDL 2.4 03/13/2015 1436   VLDL 19 03/13/2015 1436   LDLCALC 60 03/13/2015  1436    September 2017 Total cholesterol 155, triglycerides 121, she DL 61, LDL 70  ASSESSMENT:    1. CHB (complete heart block) (HCC)   2. Pacemaker   3. Paroxysmal atrial fibrillation (HCC)   4. Essential hypertension   5. Pure hypercholesterolemia      PLAN:  In order of problems listed above:  1. CHB: Pacemaker dependent. No underlying escape rhythm 2. PPM: Normally functioning dual-chamber device. Unable to perform remote downloads with this model. increase frequency of office checks to every 3 months. 3. AFib: Atrial fibrillation has not been recorded in many years (at least since 2012). She is not receiving anticoagulation but is  only on aspirin 4. HTN: well controlled 5. HLP: Excellent lipid profile on statin. Will request updated   Medication Adjustments/Labs and Tests Ordered: Current medicines are reviewed at length with the patient today.  Concerns regarding medicines are outlined above.  Medication changes, Labs and Tests ordered today are listed below. Patient Instructions  Dr Sallyanne Kuster recommends that you schedule a follow-up appointment in 3 months with a pacemaker check.  If you need a refill on your cardiac medications before your next appointment, please call your pharmacy.     Signed, Sanda Klein, MD  06/30/2016 5:30 PM    [address], [place], [place]  [address] Phone: [phone]; Fax: [phone]

## 2016-06-30 NOTE — Patient Instructions (Signed)
Dr Croitoru recommends that you schedule a follow-up appointment in 3 months with a pacemaker check.  If you need a refill on your cardiac medications before your next appointment, please call your pharmacy. 

## 2016-07-01 LAB — CUP PACEART INCLINIC DEVICE CHECK
Battery Voltage: 2.73 V
Brady Statistic RA Percent Paced: 97 %
Brady Statistic RV Percent Paced: 99 %
Implantable Lead Implant Date: 20090605
Implantable Lead Location: 753860
Lead Channel Impedance Value: 505 Ohm
Lead Channel Pacing Threshold Amplitude: 0.5 V
Lead Channel Pacing Threshold Pulse Width: 0.5 ms
Lead Channel Sensing Intrinsic Amplitude: 2.2 mV
Lead Channel Setting Pacing Amplitude: 2 V
Lead Channel Setting Pacing Pulse Width: 0.5 ms
MDC IDC LEAD IMPLANT DT: 20090601
MDC IDC LEAD LOCATION: 753859
MDC IDC MSMT BATTERY IMPEDANCE: 7500 Ohm
MDC IDC MSMT LEADCHNL RA PACING THRESHOLD PULSEWIDTH: 0.5 ms
MDC IDC MSMT LEADCHNL RV IMPEDANCE VALUE: 550 Ohm
MDC IDC MSMT LEADCHNL RV PACING THRESHOLD AMPLITUDE: 0.625 V
MDC IDC PG IMPLANT DT: 20090601
MDC IDC PG SERIAL: 2109576
MDC IDC SESS DTM: 20180110172759
MDC IDC SET LEADCHNL RV SENSING SENSITIVITY: 4 mV
Pulse Gen Model: 5826

## 2016-07-14 ENCOUNTER — Other Ambulatory Visit: Payer: Self-pay | Admitting: Cardiovascular Disease

## 2016-07-22 ENCOUNTER — Ambulatory Visit (INDEPENDENT_AMBULATORY_CARE_PROVIDER_SITE_OTHER): Payer: Medicare Other | Admitting: Internal Medicine

## 2016-07-22 ENCOUNTER — Telehealth: Payer: Self-pay | Admitting: Internal Medicine

## 2016-07-22 ENCOUNTER — Encounter: Payer: Self-pay | Admitting: Internal Medicine

## 2016-07-22 VITALS — BP 124/68 | HR 71 | Ht 59.0 in | Wt 170.0 lb

## 2016-07-22 DIAGNOSIS — R05 Cough: Secondary | ICD-10-CM

## 2016-07-22 DIAGNOSIS — R053 Chronic cough: Secondary | ICD-10-CM

## 2016-07-22 DIAGNOSIS — K219 Gastro-esophageal reflux disease without esophagitis: Secondary | ICD-10-CM | POA: Diagnosis not present

## 2016-07-22 DIAGNOSIS — R059 Cough, unspecified: Secondary | ICD-10-CM

## 2016-07-22 MED ORDER — COMPRESSOR/NEBULIZER MISC: 0 refills | Status: DC

## 2016-07-22 MED ORDER — IPRATROPIUM-ALBUTEROL 0.5-2.5 (3) MG/3ML IN SOLN: RESPIRATORY_TRACT | 12 refills | Status: DC

## 2016-07-22 NOTE — Telephone Encounter (Signed)
lmomtcb x1 

## 2016-07-22 NOTE — Telephone Encounter (Signed)
Pt aware of below message & voiced her understanding CXR has been ordered. Pt states she will come by next week to have this done. Nothing further needed.

## 2016-07-22 NOTE — Telephone Encounter (Signed)
We had mentioned it, but I'm sorry I didn't write it down.  Ok to order outpatient CXR   Dx chronic cough

## 2016-07-22 NOTE — Telephone Encounter (Signed)
Patient returning call- she can be reached at [phone] -pr

## 2016-07-22 NOTE — Telephone Encounter (Signed)
Dr Annamaria Boots please advise if this patient was supposed to have a CXR today. She told the ladies at check out that she was supposed to have one but there was nothing mentioned in the notes nor was it listed on the AVS. Please advise Dr Annamaria Boots. Thanks.

## 2016-07-22 NOTE — Patient Instructions (Addendum)
Scripts printed for nebulizer machine and ipratropium-albuterol neb solution to get from DME company.  Use up to 4 times daily at home if needed. See if this helps your cough.  Please call if we can help

## 2016-07-22 NOTE — Progress Notes (Signed)
Patient ID: [name], female    DOB: 1932-02-13, 83 y.o.   MRN: [id number]  HPI 02/23/11- 43 yoF never smoker followed for asthma, allergic rhinitis, cough, complicated by GERD PFT [id number] obstruction, minimal restriction, minimal diffusion reduction, insignificant response to bronchodilator although small airway flows improved. Methacholine challenge test 01/30/2016-positive for hyperreactive airways -----------------------------------------------------------------------------  01/15/2016-83 year old female never smoker followed for asthma, allergic rhinitis, cough, complicated by GERD, CHB/pacemaker Allergy Vaccine 1:500 GH FOLLOWS FOR: Pt still on allergy vaccine and doing well; Review PFT with patient. Pt states she is doing fair;just not alot of energy. PFT 12/02/2015-minimal obstruction, minimal restriction, minimal diffusion reduction, insignificant response to bronchodilator although small airway flows improved. She reports occasional mild wheeze. More cough and raspy breathing on first waking in the morning. Says she feels it all in her throat. Frequent cough drops. Little sputum. No chest pain or fever. Albuterol inhaler makes her shaky. Has seen ENT.  07/22/2016- 83 year old female never smoker followed for asthma, allergic rhinitis, cough, complicated by GERD, CHB/pacemaker Allergy vaccine at this office discontinued with close of program 06/20/2016 FOLLOWS FOR: Pt states that she seems to be doing okay since being off the allergy vaccines. Pt states that the cough is a little improved , seems to be on a day-to-day basis. Pt c/o wheezing.  Pt states that she is unable to use the Trelegy due to increasd cough when breathing in medication. Cough is better some days than others without clear pattern. Tends to be worse in the morning between waking and breakfast with less at night and little sleep disturbance. Methacholine challenge test 01/30/2016-positive for hyperreactive  airways Both Trelegy and Incruse inhalers triggered cough before she could fully inhale. She has never had her own nebulizer.  ROS-see HPI Constitutional:   No-   weight loss, night sweats, fevers, chills, fatigue, lassitude. HEENT:   No-  headaches, difficulty swallowing, tooth/dental problems, sore throat,       No-  sneezing, itching, ear ache, nasal congestion, post nasal drip,  CV:  No-   chest pain, orthopnea, PND, swelling in lower extremities, anasarca,  dizziness, palpitations Resp: No-   shortness of breath with exertion or at rest.              No-   productive cough,  + non-productive cough,  No- coughing up of blood.              No-   change in color of mucus.  Skin: No-   rash or lesions. GI:  No-   , abdominal pain, nausea, vomiting, GU:  MS:  No-   joint pain or swelling.   Neuro-     nothing unusual Psych:  No- change in mood or affect. No depression or anxiety.  No memory loss.  OBJ- Physical Exam General- Alert, Oriented, Affect-appropriate, Distress- none acute, + overweight Skin- rash-none, lesions- none, excoriation- none Lymphadenopathy- none Head- atraumatic            Eyes- Gross vision intact, PERRLA, conjunctivae and secretions clear            Ears- Hearing, canals-normal            Nose- Clear, no-Septal dev, mucus, polyps, erosion, perforation             Throat- Mallampati II , mucosa clear , drainage- none, tonsils- atrophic. Not hoarse Neck- flexible , trachea midline, no stridor , thyroid nl, carotid no bruit Chest - symmetrical excursion , unlabored  Heart/CV- RRR , no murmur , no gallop  , no rub, nl s1 s2                           - JVD- none , edema- none, stasis changes- none, varices- none           Lung- clear to P&A, wheeze- none  cough + dry , dullness-none, rub- none           Chest wall- + right chest pacemaker Abd-  Br/ Gen/ Rectal- Not done, not indicated Extrem- cyanosis- none, clubbing, none, atrophy- none, strength-  nl Neuro- grossly intact to observation

## 2016-07-22 NOTE — Telephone Encounter (Signed)
Rx has been fixed and sent to [facility]. Nothing further was needed.

## 2016-07-23 DIAGNOSIS — R7301 Impaired fasting glucose: Secondary | ICD-10-CM | POA: Diagnosis not present

## 2016-07-23 DIAGNOSIS — E039 Hypothyroidism, unspecified: Secondary | ICD-10-CM | POA: Diagnosis not present

## 2016-07-24 NOTE — Assessment & Plan Note (Signed)
Positive methacholine and PFT in 2011 showing response to bronchodilator with support impression of cough equivalent asthma. She has been felt to have a GERD component as well. Inhalers, especially dry powder types, trigger cough before she can successfully inhaler dose. She may do much better with a home nebulizer machine. Plan-home nebulizer with DuoNeb, nebulizer treatment today with Xopenex, chest x-ray

## 2016-07-24 NOTE — Assessment & Plan Note (Signed)
Continued careful attention to reflux precautions

## 2016-07-27 DIAGNOSIS — H9110 Presbycusis, unspecified ear: Secondary | ICD-10-CM | POA: Diagnosis not present

## 2016-07-27 DIAGNOSIS — R7301 Impaired fasting glucose: Secondary | ICD-10-CM | POA: Insufficient documentation

## 2016-07-27 DIAGNOSIS — F411 Generalized anxiety disorder: Secondary | ICD-10-CM | POA: Diagnosis not present

## 2016-07-27 DIAGNOSIS — E559 Vitamin D deficiency, unspecified: Secondary | ICD-10-CM | POA: Diagnosis not present

## 2016-07-27 DIAGNOSIS — E7800 Pure hypercholesterolemia, unspecified: Secondary | ICD-10-CM | POA: Diagnosis not present

## 2016-07-27 DIAGNOSIS — E039 Hypothyroidism, unspecified: Secondary | ICD-10-CM | POA: Diagnosis not present

## 2016-07-27 DIAGNOSIS — K219 Gastro-esophageal reflux disease without esophagitis: Secondary | ICD-10-CM | POA: Diagnosis not present

## 2016-07-27 DIAGNOSIS — I10 Essential (primary) hypertension: Secondary | ICD-10-CM | POA: Diagnosis not present

## 2016-07-27 DIAGNOSIS — K5730 Diverticulosis of large intestine without perforation or abscess without bleeding: Secondary | ICD-10-CM | POA: Diagnosis not present

## 2016-08-05 ENCOUNTER — Ambulatory Visit (INDEPENDENT_AMBULATORY_CARE_PROVIDER_SITE_OTHER)
Admission: RE | Admit: 2016-08-05 | Discharge: 2016-08-05 | Disposition: A | Discharge status: Home / Self Care | Payer: Medicare Other | Source: Ambulatory Visit | Attending: Internal Medicine | Admitting: Internal Medicine

## 2016-08-05 DIAGNOSIS — R05 Cough: Secondary | ICD-10-CM | POA: Diagnosis not present

## 2016-08-05 DIAGNOSIS — R0602 Shortness of breath: Secondary | ICD-10-CM | POA: Diagnosis not present

## 2016-08-05 DIAGNOSIS — R053 Chronic cough: Secondary | ICD-10-CM

## 2016-08-11 ENCOUNTER — Telehealth: Payer: Self-pay | Admitting: Internal Medicine

## 2016-08-11 NOTE — Telephone Encounter (Signed)
Notes Recorded by Deneise Lever, MD on 08/09/2016 at 2:15 PM EST CXR- stable with no new process or definite reason for cough. Heart remains enlarged. Pacemaker is in place. ------------------------------------------------- Spoke with pt. She is aware of results. Nothing was needed.

## 2016-08-12 ENCOUNTER — Telehealth: Payer: Self-pay | Admitting: Cardiovascular Disease

## 2016-08-12 NOTE — Telephone Encounter (Signed)
Received records from [facility] for appointment with Dr Claiborne Billings on 08/31/16.  Records put with Dr Evette Georges schedule for 08/31/16. lp

## 2016-08-17 ENCOUNTER — Telehealth: Payer: Self-pay | Admitting: Cardiovascular Disease

## 2016-08-17 NOTE — Telephone Encounter (Signed)
Returned call to patient-advised we do not give shingles vaccination at our office but she should be able to go to [facility] to receive one if interested as she does not have PCP.    Pt verbalized understanding.

## 2016-08-17 NOTE — Telephone Encounter (Signed)
New Message  Pt voiced wanting to know if we provide shingles shot.  Pt voiced she does not have a PCP.

## 2016-08-31 ENCOUNTER — Ambulatory Visit (INDEPENDENT_AMBULATORY_CARE_PROVIDER_SITE_OTHER): Payer: Medicare Other | Admitting: Cardiovascular Disease

## 2016-08-31 ENCOUNTER — Encounter: Payer: Self-pay | Admitting: Cardiovascular Disease

## 2016-08-31 VITALS — BP 136/52 | HR 70 | Ht 59.0 in | Wt 168.2 lb

## 2016-08-31 DIAGNOSIS — E039 Hypothyroidism, unspecified: Secondary | ICD-10-CM | POA: Diagnosis not present

## 2016-08-31 DIAGNOSIS — E7800 Pure hypercholesterolemia, unspecified: Secondary | ICD-10-CM

## 2016-08-31 DIAGNOSIS — R5383 Other fatigue: Secondary | ICD-10-CM

## 2016-08-31 DIAGNOSIS — I10 Essential (primary) hypertension: Secondary | ICD-10-CM | POA: Diagnosis not present

## 2016-08-31 DIAGNOSIS — I4891 Unspecified atrial fibrillation: Secondary | ICD-10-CM | POA: Diagnosis not present

## 2016-08-31 DIAGNOSIS — Z950 Presence of cardiac pacemaker: Secondary | ICD-10-CM | POA: Diagnosis not present

## 2016-08-31 NOTE — Patient Instructions (Signed)
Your physician wants you to follow-up in: 6 MONTHS WITH DR KELLY You will receive a reminder letter in the mail two months in advance. If you don't receive a letter, please call our office to schedule the follow-up appointment.   If you need a refill on your cardiac medications before your next appointment, please call your pharmacy.  

## 2016-08-31 NOTE — Progress Notes (Signed)
Patient ID: [name], female   DOB: 1931-09-26, 83 y.o.   MRN: [id number]    Primary MD: Dr. Legrand Como Altheimer  HPI: [name] is a 83 y.o. female who presents to the office today for a 5 month cardiology evaluation.  [name] underwent dual-chamber pacemaker implantation for complete heart block. She has a history of paroxysmal atrial fibrillation, a history of lower extremity edema as well as GERD. She also has a history of hypertension as well as hyperlipidemia.   A follow-up echo Doppler study on 10/23/2014 revealed an ejection fraction in the 50-55% range with grade 1 diastolic dysfunction and mild mitral regurgitation. Her last pacemaker interrogation was in June 2017 by Dr. Sallyanne Kuster.  At that time, she had normal pacemaker function and had 97% atrial pacing and 100% ventricular pacing.  There was no underlying escape rhythm.  Estimated longevity was 1.75-2.25 years.  She continues to do well and is without chest pain.  She notes occasional fatigue.  Typically she goes to bed at midnight and wakes up at 6 AM.  She experiences a dry cough and is unaware of any postnasal drip.  She has a history of GERD.  She denies recent wheezing. She denies presyncope or syncope. At times she admits to intermittent leg swelling. She has a history of hypothyroidism on thyroid replacement.  She sees Dr. Elyse Hsu for primary and endocrinologic care and recently saw him on 08/03/2016.  He is now part of the [facility].  Her recent lipid study on pravastatin from February 2018 showed a total cholesterol 137, triglycerides 89, HDL 61, and LDL 69.  Most recent hemoglobin A1c was 5.8.  TSH was 3.2.  Vitamin D level was 42.  She presents for evaluation.   Past Medical History:  Diagnosis Date  . Allergic rhinitis   . Asthmatic bronchitis   . Chronic cough   . Esophageal reflux   . GERD (gastroesophageal reflux disease)   . History of stress test 11/20/2009   This was essentially  normal,post-stress EF was hyperdynamic at 73 beats per mins, There was no scar or ischemia.  Marland Kitchen Hx of echocardiogram 11/05/2010   EF>55% showed a normal systolic function with impaired diastolic dysfuntion and also tissue doppler suggesting increased elevated left atrial pressure, she had moderate LA dilatation and mild LA dilatation. There was calcified mitral apparatus with moderate MR, mild to moderate TR and trace aortic insufficiency.  . Hyperlipemia   . Hypertension   . PAF (paroxysmal atrial fibrillation) ([place])     Past Surgical History:  Procedure Laterality Date  . CHOLECYSTECTOMY    . FOOT SURGERY     right  . NASAL SEPTUM SURGERY    . PACEMAKER INSERTION  June 1st 2009   [employer] Jude Quartz[employer] device, model #5826, serial #[id number], The artial lead is a Interior and spatial designer, the ventricular lead is Environmental education officer.  . THYROID SURGERY    . TONSILLECTOMY    . VESICOVAGINAL FISTULA CLOSURE W/ TAH      Allergies  Allergen Reactions  . Fluticasone-Salmeterol Other (See Comments)    unknown  . Sulfonamide Derivatives Other (See Comments)    unknown  . Erythromycin Nausea And Vomiting and Other (See Comments)    Stomach cramps    Current Outpatient Prescriptions  Medication Sig Dispense Refill  . aspirin 81 MG tablet Take 81 mg by mouth daily.      . cholecalciferol (VITAMIN D) 1000 UNITS tablet Take 1,000 Units by mouth  daily.      . diltiazem (CARDIZEM CD) 180 MG 24 hr capsule Take 1 capsule (180 mg total) by mouth daily. KEEP OV. 30 capsule 3  . ipratropium-albuterol (DUONEB) 0.5-2.5 (3) MG/3ML SOLN 1 neb every 6 hours for cough or wheeze 150 mL 12  . LORazepam (ATIVAN) 0.5 MG tablet Take 0.5 mg by mouth 2 (two) times daily as needed for anxiety.     . metoprolol succinate (TOPROL-XL) 25 MG 24 hr tablet Take 1 tablet (25 mg total) by mouth daily. 30 tablet 11  . MULTIPLE VITAMIN PO Take 1 tablet by mouth daily.    . Nebulizers (COMPRESSOR/NEBULIZER) MISC Use every 6 hours if needed 1 each  0  . NON FORMULARY Allergy vaccines 1:500 biweekly GH    . pantoprazole (PROTONIX) 40 MG tablet Take 1 tablet by mouth daily before breakfast.    . pravastatin (PRAVACHOL) 40 MG tablet Take 1 tablet (40 mg total) by mouth daily. 90 tablet 3  . SYNTHROID 25 MCG tablet Take 1 tablet by mouth daily.  12  . valsartan-hydrochlorothiazide (DIOVAN-HCT) 160-25 MG tablet TAKE 1 TABLET BY MOUTH DAILY. 30 tablet 5   No current facility-administered medications for this visit.     Social History   Social History  . Marital status: Married    Spouse name: N/A  . Number of children: N/A  . Years of education: N/A   Occupational History  .  Retired    Retired Sales promotion account executive at [employer]  . Smoking status: Never Smoker  . Smokeless tobacco: Never Used  . Alcohol use No  . Drug use: No  . Sexual activity: Not on file   Other Topics Concern  . Not on file   Social History Narrative  . No narrative on file    Family History  Problem Relation Age of Onset  . Stroke Mother   . Cancer Father     bladder  . Stroke Sister    Additional social history is notable that she is widowed and has one daughter and 2 sons.  ROS General: Negative; No fevers, chills, or night sweats;  HEENT: Negative; No changes in vision or hearing, sinus congestion, difficulty swallowing Pulmonary: Negative; No cough, wheezing, shortness of breath, hemoptysis Cardiovascular: Negative; No chest pain, presyncope, syncope, palpitations.  Positive for leg swelling GI: Positive for GERD, controlled with Nexium; No nausea, vomiting, diarrhea, or abdominal pain GU: Negative; No dysuria, hematuria, or difficulty voiding Musculoskeletal: Negative; no myalgias, joint pain, or weakness Hematologic/Oncology: Negative; no easy bruising, bleeding Endocrine: Negative; no heat/cold intolerance; no diabetes Neuro: Negative; no changes in balance, headaches Skin: Negative; No rashes or  skin lesions Psychiatric: Negative; No behavioral problems, depression Sleep: Negative; No snoring, daytime sleepiness, hypersomnolence, bruxism, restless legs, hypnogognic hallucinations, no cataplexy Other comprehensive 14 point system review is negative   PE BP (!) 136/52   Pulse 70   Ht 4' 11"  (1.499 m)   Wt 168 lb 3.2 oz (76.3 kg)   BMI 33.97 kg/m   Wt Readings from Last 3 Encounters:  08/31/16 168 lb 3.2 oz (76.3 kg)  07/22/16 170 lb (77.1 kg)  06/30/16 169 lb 12.8 oz (77 kg)   General: Alert, oriented, no distress.  Skin: normal turgor, no rashes HEENT: Normocephalic, atraumatic. Pupils round and reactive; sclera anicteric;no lid lag.  Nose without nasal septal hypertrophy Mouth/Parynx benign; Mallinpatti scale 2 Neck: No JVD, no carotid bruits with normal carotid upstroke Lungs:  clear to ausculatation and percussion; no wheezing or rales Chest wall: Nontender to palpation Heart: RRR, s1 s2 normal 1/6 systolic murmur; no diastolic murmur.  No S3 or S4 gallop.  No rubs, thrills or heaves. Abdomen: Mild central adiposity; soft, nontender; no hepatosplenomehaly, BS+; abdominal aorta nontender and not dilated by palpation. Back: No CVA Pulses 2+ Extremities: Trace edema;    no clubbing cyanosis, Homan's sign negative  Neurologic: grossly nonfocal; cranial nerves grossly normal Psychological: Normal affect and mood appear normal.  Cognitive function normal.  ECG (independently read by me): AV paced rhythm at 70 bpm with prolonged AV conduction with a PR interval of 222 ms.  QTc interval 490 ms.  October 2017 ECG (independently read by me): AV paced rhythm with 100% pacing of both chambers.  PR interval 226 ms.    December 2016 ECG (independently read by me):  100% AV pacing with ventricular rate at 70 and prolonged AV conduction at 224 ms.  June 2016ECG (independently read by me): AV sequentially paced rhythm with 100% capture.  Ventricular rate 70 beats per minute.  PR  interval 224 ms.  August 2014 ECG: 80 paced rhythm at 70 beats per minute. PR interval 222 ms.  LABS: I personally reviewed blood work done by Dr. Elyse Hsu from 12/03/2015, and 03/08/2016.  Total cholesterol was 155, triglycerides 120, LDL 70, HDL 61.  Creatinine 1.03.  Glucose 111.  Hemoglobin A1c 5.9.  TSH 4.85.  Vitamin D 38.  Free T4 1 0.06. As noted above in my note, I also reviewed lab work from February 2018.  BMP Latest Ref Rng & Units 10/16/2014 10/15/2014 11/28/2007  Glucose 70 - 99 mg/dL 120(H) 107(H) 154(H)  BUN 6 - 23 mg/dL 18 20 16   Creatinine 0.50 - 1.10 mg/dL 1.14(H) 0.83 0.96  Sodium 135 - 145 mmol/L 136 136 139  Potassium 3.5 - 5.1 mmol/L 3.7 3.9 3.8  Chloride 96 - 112 mmol/L 99 99 104  CO2 19 - 32 mmol/L 28 28 27   Calcium 8.4 - 10.5 mg/dL 9.0 8.9 8.7   Hepatic Function Latest Ref Rng & Units 10/16/2014 11/28/2007 11/24/2007  Total Protein 6.0 - 8.3 g/dL 6.6 5.9(L) 5.8(L)  Albumin 3.5 - 5.2 g/dL 3.6 2.6(L) 2.8(L)  AST 0 - 37 U/L 24 21 77(H)  ALT 0 - 35 U/L 21 29 85(H)  Alk Phosphatase 39 - 117 U/L 72 61 75  Total Bilirubin 0.3 - 1.2 mg/dL 0.3 0.4 0.4   CBC Latest Ref Rng & Units 10/16/2014 11/28/2007 11/26/2007  WBC 4.0 - 10.5 K/uL 7.3 8.7 10.6(H)  Hemoglobin 12.0 - 15.0 g/dL 11.0(L) 9.7(L) 10.8(L)  Hematocrit 36.0 - 46.0 % 33.3(L) 28.5(L) 31.6(L)  Platelets 150 - 400 K/uL 216 183 170   Lab Results  Component Value Date   MCV 91.0 10/16/2014   MCV 91.2 11/28/2007   MCV 91.3 11/26/2007   Lipid Panel     Component Value Date/Time   CHOL 136 03/13/2015 1436   TRIG 95 03/13/2015 1436   HDL 57 03/13/2015 1436   CHOLHDL 2.4 03/13/2015 1436   VLDL 19 03/13/2015 1436   LDLCALC 60 03/13/2015 1436  RADIOLOGY: No results found.  IMPRESSION:  1. Atrial fibrillation, unspecified type ([place])   2. Essential hypertension   3. Pure hypercholesterolemia   4. Fatigue, unspecified type   5. Hypothyroidism, unspecified type   6. Pacemaker     ASSESSMENT AND PLAN: Ms.  Mcnee is an 83 year old female who has a history of hypertension, PAF,  dual chamber pacemaker implantation for complete heart block, and intermittent leg edema.  With reference to her hypertension, her blood pressure today controlled on valsartan HCT 160/25, Toprol-XL 69m and diltiazem 180 mg.  Her leg edema improved with titration of her HCTZ component to 25 mg.  An echo Doppler study on 10/23/2014 showed an ejection fraction of 50-55% without regional wall motion abnormalities.  There was evidence for grade 1 diastolic dysfunction.  There was mild mitral regurgitation.  She continues to be 100% paced.  She is scheduled for a follow-up pacemaker evaluation on 10/29/2016.  I reviewed the recent blood work from February 2018 done by Dr. AEden Emms  Her lipid studies are good.  She continues to be on low-dose thyroid levothyroxine at 25 g.  There is no wheezing on exam today and she continues to take DuoNeb as needed.  She is on Protonix for GERD which is stable.  She will continue current therapy.  I will see her in 6 months for reevaluation.    Time spent: 25 minutes  TTroy Sine MD, [facility] 09/01/2016 7:12 PM

## 2016-09-29 DIAGNOSIS — D3131 Benign neoplasm of right choroid: Secondary | ICD-10-CM | POA: Diagnosis not present

## 2016-09-29 DIAGNOSIS — Z961 Presence of intraocular lens: Secondary | ICD-10-CM | POA: Diagnosis not present

## 2016-10-18 ENCOUNTER — Other Ambulatory Visit: Payer: Self-pay | Admitting: Cardiovascular Disease

## 2016-10-20 DIAGNOSIS — E559 Vitamin D deficiency, unspecified: Secondary | ICD-10-CM | POA: Diagnosis not present

## 2016-10-20 DIAGNOSIS — R7301 Impaired fasting glucose: Secondary | ICD-10-CM | POA: Diagnosis not present

## 2016-10-20 DIAGNOSIS — E7800 Pure hypercholesterolemia, unspecified: Secondary | ICD-10-CM | POA: Diagnosis not present

## 2016-10-20 DIAGNOSIS — I10 Essential (primary) hypertension: Secondary | ICD-10-CM | POA: Diagnosis not present

## 2016-10-20 DIAGNOSIS — E039 Hypothyroidism, unspecified: Secondary | ICD-10-CM | POA: Diagnosis not present

## 2016-10-25 DIAGNOSIS — R7301 Impaired fasting glucose: Secondary | ICD-10-CM | POA: Diagnosis not present

## 2016-10-25 DIAGNOSIS — R5382 Chronic fatigue, unspecified: Secondary | ICD-10-CM | POA: Diagnosis not present

## 2016-10-25 DIAGNOSIS — I10 Essential (primary) hypertension: Secondary | ICD-10-CM | POA: Diagnosis not present

## 2016-10-25 DIAGNOSIS — E559 Vitamin D deficiency, unspecified: Secondary | ICD-10-CM | POA: Diagnosis not present

## 2016-10-25 DIAGNOSIS — E7800 Pure hypercholesterolemia, unspecified: Secondary | ICD-10-CM | POA: Diagnosis not present

## 2016-10-25 DIAGNOSIS — E039 Hypothyroidism, unspecified: Secondary | ICD-10-CM | POA: Diagnosis not present

## 2016-10-25 DIAGNOSIS — Z833 Family history of diabetes mellitus: Secondary | ICD-10-CM | POA: Diagnosis not present

## 2016-10-26 ENCOUNTER — Telehealth: Payer: Self-pay | Admitting: Cardiovascular Disease

## 2016-10-26 NOTE — Telephone Encounter (Signed)
Received records from [facility] for appointment on 10/29/16 with Dr Sallyanne Kuster.  Records put with Dr Croitoru's schedule for 10/29/16. lp

## 2016-10-29 ENCOUNTER — Encounter: Payer: Self-pay | Admitting: Cardiovascular Disease

## 2016-10-29 ENCOUNTER — Ambulatory Visit (INDEPENDENT_AMBULATORY_CARE_PROVIDER_SITE_OTHER): Payer: Medicare Other | Admitting: Cardiovascular Disease

## 2016-10-29 VITALS — BP 131/63 | HR 69 | Ht 59.0 in | Wt 168.2 lb

## 2016-10-29 DIAGNOSIS — I10 Essential (primary) hypertension: Secondary | ICD-10-CM | POA: Diagnosis not present

## 2016-10-29 DIAGNOSIS — Z950 Presence of cardiac pacemaker: Secondary | ICD-10-CM | POA: Diagnosis not present

## 2016-10-29 DIAGNOSIS — E7800 Pure hypercholesterolemia, unspecified: Secondary | ICD-10-CM | POA: Diagnosis not present

## 2016-10-29 DIAGNOSIS — I442 Atrioventricular block, complete: Secondary | ICD-10-CM

## 2016-10-29 DIAGNOSIS — I480 Paroxysmal atrial fibrillation: Secondary | ICD-10-CM

## 2016-10-29 NOTE — Progress Notes (Signed)
Patient ID: [name], female   DOB: 1931-07-16, 83 y.o.   MRN: [id number] Patient ID: [name], female   DOB: 1932/02/20, 83 y.o.   MRN: [id number]    Cardiology Office Note    Date:  10/31/2016   ID:  [name], DOB 12-10-1931, MRN [id number]  PCP:  Lorne Skeens, MD  Cardiologist: Shelva Majestic, M.D.;  Sanda Klein, MD   Chief Complaint  Patient presents with  . Follow-up    History of Present Illness:  [name] is a 83 y.o. female who returns in follow-up for sinus node dysfunction, complete heart block, paroxysmal atrial .fibrillation and pacemaker check.  She has no new cardiac complaints. She last saw Dr. Claiborne Billings on March 13  The patient specifically denies any chest pain at rest or exertion, dyspnea at rest or with exertion, orthopnea, paroxysmal nocturnal dyspnea, syncope, palpitations, focal neurological deficits, intermittent claudication,  unexplained weight gain, cough, hemoptysis or wheezing.  She has developed worsening problems with lower extremity edema. It routinely resolves after overnight lying in bed.  Pacemaker with normal function. Battery is approaching ERI. Estimated longevity 0.5-0.75 years has 93% atrial pacing and 100% ventricular pacing without it escape rhythm. She has not had any episodes of mode switch over ventricular tachycardia. Lead parameters are excellent. She has a Education officer, museum dual chamber, not amenable to remote monitoring.  Past Medical History:  Diagnosis Date  . Allergic rhinitis   . Asthmatic bronchitis   . Chronic cough   . Esophageal reflux   . GERD (gastroesophageal reflux disease)   . History of stress test 11/20/2009   This was essentially normal,post-stress EF was hyperdynamic at 73 beats per mins, There was no scar or ischemia.  Marland Kitchen Hx of echocardiogram 11/05/2010   EF>55% showed a normal systolic function with impaired diastolic dysfuntion and also tissue doppler suggesting increased elevated left atrial  pressure, she had moderate LA dilatation and mild LA dilatation. There was calcified mitral apparatus with moderate MR, mild to moderate TR and trace aortic insufficiency.  . Hyperlipemia   . Hypertension   . PAF (paroxysmal atrial fibrillation) ([place])     Past Surgical History:  Procedure Laterality Date  . CHOLECYSTECTOMY    . FOOT SURGERY     right  . NASAL SEPTUM SURGERY    . PACEMAKER INSERTION  June 1st 2009   St Jude Peoa device, model #5826, serial #[id number], The artial lead is a Interior and spatial designer, the ventricular lead is Environmental education officer.  . THYROID SURGERY    . TONSILLECTOMY    . VESICOVAGINAL FISTULA CLOSURE W/ TAH      Current Outpatient Prescriptions  Medication Sig Dispense Refill  . aspirin 81 MG tablet Take 81 mg by mouth daily.      . cholecalciferol (VITAMIN D) 1000 UNITS tablet Take 1,000 Units by mouth daily.      Marland Kitchen diltiazem (CARDIZEM CD) 180 MG 24 hr capsule TAKE 1 CAPSULE BY MOUTH DAILY. KEEP APPT. 30 capsule 6  . ipratropium-albuterol (DUONEB) 0.5-2.5 (3) MG/3ML SOLN 1 neb every 6 hours for cough or wheeze 150 mL 12  . LORazepam (ATIVAN) 0.5 MG tablet Take 0.5 mg by mouth 2 (two) times daily as needed for anxiety.     . metoprolol succinate (TOPROL-XL) 25 MG 24 hr tablet Take 1 tablet (25 mg total) by mouth daily. 30 tablet 11  . MULTIPLE VITAMIN PO Take 1 tablet by mouth daily.    . pantoprazole (PROTONIX) 40 MG  tablet Take 1 tablet by mouth daily before breakfast.    . pravastatin (PRAVACHOL) 40 MG tablet Take 1 tablet (40 mg total) by mouth daily. 90 tablet 3  . SYNTHROID 25 MCG tablet Take 1 tablet by mouth daily.  12  . valsartan-hydrochlorothiazide (DIOVAN-HCT) 160-25 MG tablet TAKE 1 TABLET BY MOUTH DAILY. 30 tablet 5   No current facility-administered medications for this visit.     Allergies:   Fluticasone-salmeterol; Sulfonamide derivatives; and Erythromycin   Social History   Social History  . Marital status: Married    Spouse name: N/A  .  Number of children: N/A  . Years of education: N/A   Occupational History  .  Retired    Retired Sales promotion account executive at [employer]  . Smoking status: Never Smoker  . Smokeless tobacco: Never Used  . Alcohol use No  . Drug use: No  . Sexual activity: Not Asked   Other Topics Concern  . None   Social History Narrative  . None     Family History:  The patient's family history includes Cancer in her father; Stroke in her mother and sister.   ROS:   Please see the history of present illness.    Review of Systems  Cardiovascular: Positive for dyspnea on exertion.  Musculoskeletal: Positive for arthritis, back pain, muscle cramps and stiffness.   All other systems reviewed and are negative.   PHYSICAL EXAM:   VS:  BP 131/63   Pulse 69   Ht 4\' 11"  (1.499 m)   Wt 168 lb 3.2 oz (76.3 kg)   BMI 33.97 kg/m     General: Alert, oriented x3, no distress Head: no evidence of trauma, PERRL, EOMI, no exophtalmos or lid lag, no myxedema, no xanthelasma; normal ears, nose and oropharynx Neck: normal jugular venous pulsations and no hepatojugular reflux; brisk carotid pulses without delay and no carotid bruits Chest: clear to auscultation, no signs of consolidation by percussion or palpation, normal fremitus, symmetrical and full respiratory excursions Cardiovascular: normal position and quality of the apical impulse, regular rhythm, normal first and Paradoxically split second heart sounds, no murmurs, rubs or gallops; healthy subclavian pacemaker site Abdomen: no tenderness or distention, no masses by palpation, no abnormal pulsatility or arterial bruits, normal bowel sounds, no hepatosplenomegaly Extremities: no clubbing, cyanosis; there is one-2+ symmetrical ankle edema; 2+ radial, ulnar and brachial pulses bilaterally; 2+ right femoral, posterior tibial and dorsalis pedis pulses; 2+ left femoral, posterior tibial and dorsalis pedis pulses; no subclavian  or femoral bruits Neurological: grossly nonfocal   Wt Readings from Last 3 Encounters:  10/29/16 168 lb 3.2 oz (76.3 kg)  08/31/16 168 lb 3.2 oz (76.3 kg)  07/22/16 170 lb (77.1 kg)      Studies/Labs Reviewed:   EKG:  EKG is not ordered today.  The ekg ordered March 13 demonstrates 100% AV sequential pacing  Recent Labs: No results found for requested labs within last 8760 hours.   Lipid Panel    Component Value Date/Time   CHOL 136 03/13/2015 1436   TRIG 95 03/13/2015 1436   HDL 57 03/13/2015 1436   CHOLHDL 2.4 03/13/2015 1436   VLDL 19 03/13/2015 1436   LDLCALC 60 03/13/2015 1436    September 2017 Total cholesterol 155, triglycerides 121, HDL 61, LDL 70 May 2018 total cholesterol 151, triglycerides 82, HDL 55, LDL 86  ASSESSMENT:    1. CHB (complete heart block) (HCC)   2. Pacemaker  3. Paroxysmal atrial fibrillation (HCC)   4. Essential hypertension   5. Pure hypercholesterolemia      PLAN:  In order of problems listed above:  1. CHB: Pacemaker dependent. No underlying escape rhythm 2. PPM: Generator approaching ERI, anticipated to occur before the end of this year. Unable to perform remote downloads with this model. Continue office checks every 3 months. Warned her of the symptoms of asynchronous pacing when the generator reaches ERI. She should call us for an extra evaluation should these symptoms occur. However warned her that the switch to ERI may not be associated with symptoms and the importance of routine every three-month checks at a minimum, since she is pacemaker dependent. Lead parameters remain very good, will only the generator change 3. AFib: Atrial fibrillation has not been recorded in many years (at least since 2012). She is only receiving aspirin and is not on anticoagulation.  4. HTN: Well-controlled 5. HLP: Most recent lipid profile with excellent parameters   Medication Adjustments/Labs and Tests Ordered: Current medicines are reviewed at  length with the patient today.  Concerns regarding medicines are outlined above.  Medication changes, Labs and Tests ordered today are listed below. Patient Instructions  Dr Sallyanne Kuster recommends that you continue on your current medications as directed. Please refer to the Current Medication list given to you today.  Your physician recommends that you schedule a follow-up appointment in 3 months in the device clinic at [facility].  Dr Sallyanne Kuster recommends that you schedule a follow-up appointment in 6 months with a pacemaker check. You will receive a reminder letter in the mail two months in advance. If you don't receive a letter, please call our office to schedule the follow-up appointment.  If you need a refill on your cardiac medications before your next appointment, please call your pharmacy.     Signed, Sanda Klein, MD  10/31/2016 9:42 PM    [facility] [place], [place], [place]  [address] Phone: [phone]; Fax: [phone]

## 2016-10-29 NOTE — Patient Instructions (Signed)
Dr Sallyanne Kuster recommends that you continue on your current medications as directed. Please refer to the Current Medication list given to you today.  Your physician recommends that you schedule a follow-up appointment in 3 months in the device clinic at [facility].  Dr Sallyanne Kuster recommends that you schedule a follow-up appointment in 6 months with a pacemaker check. You will receive a reminder letter in the mail two months in advance. If you don't receive a letter, please call our office to schedule the follow-up appointment.  If you need a refill on your cardiac medications before your next appointment, please call your pharmacy.

## 2016-11-29 DIAGNOSIS — D3131 Benign neoplasm of right choroid: Secondary | ICD-10-CM | POA: Diagnosis not present

## 2016-11-29 DIAGNOSIS — H353121 Nonexudative age-related macular degeneration, left eye, early dry stage: Secondary | ICD-10-CM | POA: Diagnosis not present

## 2016-11-29 DIAGNOSIS — H353111 Nonexudative age-related macular degeneration, right eye, early dry stage: Secondary | ICD-10-CM | POA: Diagnosis not present

## 2016-11-29 DIAGNOSIS — H35363 Drusen (degenerative) of macula, bilateral: Secondary | ICD-10-CM | POA: Diagnosis not present

## 2016-11-29 DIAGNOSIS — H43813 Vitreous degeneration, bilateral: Secondary | ICD-10-CM | POA: Diagnosis not present

## 2016-12-21 DIAGNOSIS — J3089 Other allergic rhinitis: Secondary | ICD-10-CM | POA: Diagnosis not present

## 2016-12-21 DIAGNOSIS — R05 Cough: Secondary | ICD-10-CM | POA: Diagnosis not present

## 2016-12-21 DIAGNOSIS — J301 Allergic rhinitis due to pollen: Secondary | ICD-10-CM | POA: Diagnosis not present

## 2017-01-13 ENCOUNTER — Other Ambulatory Visit: Payer: Self-pay | Admitting: Cardiovascular Disease

## 2017-01-20 ENCOUNTER — Ambulatory Visit: Payer: PRIVATE HEALTH INSURANCE | Admitting: Internal Medicine

## 2017-01-25 DIAGNOSIS — E7800 Pure hypercholesterolemia, unspecified: Secondary | ICD-10-CM | POA: Diagnosis not present

## 2017-01-25 DIAGNOSIS — R7301 Impaired fasting glucose: Secondary | ICD-10-CM | POA: Diagnosis not present

## 2017-01-25 DIAGNOSIS — E039 Hypothyroidism, unspecified: Secondary | ICD-10-CM | POA: Diagnosis not present

## 2017-01-25 DIAGNOSIS — I10 Essential (primary) hypertension: Secondary | ICD-10-CM | POA: Diagnosis not present

## 2017-01-25 DIAGNOSIS — H04123 Dry eye syndrome of bilateral lacrimal glands: Secondary | ICD-10-CM | POA: Diagnosis not present

## 2017-01-31 ENCOUNTER — Encounter: Payer: Self-pay | Admitting: Cardiovascular Disease

## 2017-01-31 ENCOUNTER — Ambulatory Visit (INDEPENDENT_AMBULATORY_CARE_PROVIDER_SITE_OTHER): Payer: Medicare Other | Admitting: *Deleted

## 2017-01-31 DIAGNOSIS — I442 Atrioventricular block, complete: Secondary | ICD-10-CM

## 2017-01-31 DIAGNOSIS — K219 Gastro-esophageal reflux disease without esophagitis: Secondary | ICD-10-CM | POA: Diagnosis not present

## 2017-01-31 DIAGNOSIS — E7800 Pure hypercholesterolemia, unspecified: Secondary | ICD-10-CM | POA: Diagnosis not present

## 2017-01-31 DIAGNOSIS — E039 Hypothyroidism, unspecified: Secondary | ICD-10-CM | POA: Diagnosis not present

## 2017-01-31 DIAGNOSIS — I10 Essential (primary) hypertension: Secondary | ICD-10-CM | POA: Diagnosis not present

## 2017-01-31 DIAGNOSIS — E559 Vitamin D deficiency, unspecified: Secondary | ICD-10-CM | POA: Diagnosis not present

## 2017-01-31 DIAGNOSIS — R5382 Chronic fatigue, unspecified: Secondary | ICD-10-CM | POA: Diagnosis not present

## 2017-01-31 DIAGNOSIS — R7301 Impaired fasting glucose: Secondary | ICD-10-CM | POA: Diagnosis not present

## 2017-02-02 LAB — CUP PACEART INCLINIC DEVICE CHECK
Battery Voltage: 3.04 V
Brady Statistic RV Percent Paced: 0.16 %
Date Time Interrogation Session: 20180809193522
Implantable Lead Implant Date: 20090601
Implantable Lead Implant Date: 20090605
Implantable Lead Location: 753859
Implantable Pulse Generator Implant Date: 20090601
Lead Channel Impedance Value: 450 Ohm
Lead Channel Pacing Threshold Amplitude: 0.5 V
Lead Channel Pacing Threshold Pulse Width: 0.5 ms
Lead Channel Sensing Intrinsic Amplitude: 8.8 mV
Lead Channel Setting Pacing Amplitude: 3.5 V
Lead Channel Setting Pacing Pulse Width: 0.5 ms
MDC IDC LEAD LOCATION: 753860
MDC IDC MSMT BATTERY REMAINING LONGEVITY: 84 mo
MDC IDC MSMT LEADCHNL RA IMPEDANCE VALUE: 462.5 Ohm
MDC IDC MSMT LEADCHNL RA PACING THRESHOLD AMPLITUDE: 0.5 V
MDC IDC MSMT LEADCHNL RA PACING THRESHOLD AMPLITUDE: 0.5 V
MDC IDC MSMT LEADCHNL RA PACING THRESHOLD PULSEWIDTH: 0.5 ms
MDC IDC MSMT LEADCHNL RA PACING THRESHOLD PULSEWIDTH: 0.5 ms
MDC IDC MSMT LEADCHNL RA SENSING INTR AMPL: 1.3 mV
MDC IDC MSMT LEADCHNL RV PACING THRESHOLD AMPLITUDE: 0.5 V
MDC IDC MSMT LEADCHNL RV PACING THRESHOLD PULSEWIDTH: 0.5 ms
MDC IDC SET LEADCHNL RV PACING AMPLITUDE: 3.5 V
MDC IDC SET LEADCHNL RV SENSING SENSITIVITY: 2 mV
MDC IDC STAT BRADY RA PERCENT PACED: 84 %
Pulse Gen Model: 5826
Pulse Gen Serial Number: 2109576

## 2017-02-02 NOTE — Progress Notes (Signed)
Pacemaker check in clinic. Normal device function. Thresholds, sensing, impedances consistent with previous measurements. Device programmed to maximize longevity. No mode switches. Device programmed at appropriate safety margins. Histogram distribution appropriate for patient activity level. Device programmed to optimize intrinsic conduction. ERI reached on 12/31/16. Patient will follow up with [facility] on 8/17 @ 1600 to discuss gen change.

## 2017-02-04 ENCOUNTER — Ambulatory Visit (INDEPENDENT_AMBULATORY_CARE_PROVIDER_SITE_OTHER): Payer: Medicare Other | Admitting: Cardiovascular Disease

## 2017-02-04 ENCOUNTER — Encounter: Payer: Self-pay | Admitting: Cardiovascular Disease

## 2017-02-04 ENCOUNTER — Telehealth: Payer: Self-pay | Admitting: Cardiovascular Disease

## 2017-02-04 VITALS — BP 140/52 | HR 68 | Ht 59.0 in | Wt 165.0 lb

## 2017-02-04 DIAGNOSIS — D689 Coagulation defect, unspecified: Secondary | ICD-10-CM

## 2017-02-04 DIAGNOSIS — I442 Atrioventricular block, complete: Secondary | ICD-10-CM

## 2017-02-04 DIAGNOSIS — Z45010 Encounter for checking and testing of cardiac pacemaker pulse generator [battery]: Secondary | ICD-10-CM

## 2017-02-04 DIAGNOSIS — I10 Essential (primary) hypertension: Secondary | ICD-10-CM

## 2017-02-04 DIAGNOSIS — Z01812 Encounter for preprocedural laboratory examination: Secondary | ICD-10-CM

## 2017-02-04 DIAGNOSIS — E7800 Pure hypercholesterolemia, unspecified: Secondary | ICD-10-CM

## 2017-02-04 DIAGNOSIS — Z79899 Other long term (current) drug therapy: Secondary | ICD-10-CM | POA: Diagnosis not present

## 2017-02-04 DIAGNOSIS — I480 Paroxysmal atrial fibrillation: Secondary | ICD-10-CM

## 2017-02-04 NOTE — Telephone Encounter (Signed)
Received incoming records from [facility] for upcoming appointment on 03/23/17 @ 11:00am with Dr. Claiborne Billings. Records given to [facility] in Medical Records. 02/04/17/ab

## 2017-02-04 NOTE — Patient Instructions (Signed)
Pacemaker Battery Change °A pacemaker battery usually lasts 5-15 years (6-7 years on average). A few times a year, you will be asked to visit your health care provider to have a full evaluation of your pacemaker. When the battery is low, your pacemaker battery and generator will be completely replaced. Most often, this procedure is simpler than the first surgery because the wires (leads) that connect the generator to the heart are already in place. °There are many things that affect how long a pacemaker battery will last, including: °· The age of the pacemaker. °· The number of leads you have(1, 2, or 3). °· The pacemaker workload. If the pacemaker is helping the heart more often, the battery will not last as long. °· Power (voltage) settings. ° °Tell a health care provider about: °· Any allergies you have. °· All medicines you are taking, including vitamins, herbs, eye drops, creams, and over-the-counter medicines. °· Any problems you or family members have had with anesthetic medicines. °· Any blood disorders you have. °· Any surgeries you have had, especially the surgeries you have had since your last pacemaker was placed. °· Any medical conditions you have. °· Whether you are pregnant or may be pregnant. °· Any symptoms of heart problems, such as chest pain, trouble breathing, palpitations, light-headedness, or feelings of an abnormal or irregular heartbeat. °· Smoking habits. This can affect your reaction to anesthesia. °What are the risks? °Generally, this is a safe procedure. However, problems may occur, including: °· Bleeding. °· Bruising of the skin around where the surgical cut (incision) was made. °· Pulling apart of the skin at the incision site. °· Infection. °· Nerve damage. °· Injury to other organs, such as the lungs. °· Allergic reaction to anesthetics or other medicines used during the procedure. °· People with diabetes may have a temporary increase in blood sugar (glucose) after any surgical  procedure. ° °What happens before the procedure? °Staying hydrated °Follow instructions from your health care provider about hydration, which may include: °· Up to 2 hours before the procedure - you may continue to drink clear liquids, such as water, clear fruit juice, black coffee, and plain tea. ° °Eating and drinking restrictions °Follow instructions from your health care provider about eating and drinking restrictions, which may include: °· 8 hours before the procedure - stop eating heavy meals or foods such as meat, fried foods, or fatty foods. °· 6 hours before the procedure - stop eating light meals or foods, such as toast or cereal. °· 6 hours before the procedure - stop drinking milk or drinks that contain milk. °· 2 hours before the procedure - stop drinking clear liquids. ° °General instructions °· Ask your health care provider about: °? Changing or stopping your regular medicines. This is especially important if you are taking diabetes medicines or blood thinners. °? Taking medicines such as aspirin and ibuprofen. These medicines can thin your blood. Do not take these medicines before your procedure if your health care provider instructs you not to. °? Taking a sip of water with any approved medicines on the morning of the procedure. °· Plan to have someone take you home after the procedure. °What happens during the procedure? °· To reduce your risk of infection: °? Your health care team will wash or sanitize their hands. °? The skin around the area of the chest will be washed with soap. °? Hair may be removed from the surgical area. °· An IV tube will be inserted into one your   veins to give you medicine and fluids. °· You will be given one or more of the following: °? A medicine to help you relax (sedative). °? A medicine to numb the area where the pacemaker is located (local anesthetic). °· You may be given antibiotic medicine to prevent infection. °· Your health care provider will make an incision to  reopen the pocket holding the pacemaker. °· The old pacemaker will be disconnected from the leads. °· The leads will be tested. °· If needed, the leads will be replaced. If the leads are functioning properly, the new pacemaker will be connected to the existing leads. °· A heart monitor and the pacemaker programmer will be used to make sure that the newly implanted pacemaker is working properly. °· The incision site will be closed. A bandage (dressing) will be placed over the pacemaker site. °The procedure may vary among health care providers and hospitals. °What happens after the procedure? °· Your blood pressure, heart rate, breathing rate, and blood oxygen level will be monitored until your health care team is satisfied that your pacemaker is working properly. °· Your health care provider will tell you when your pacemaker will need to be tested again, or when to return to the office for removal of dressing and stitches. °· Do not drive for 24 hours if you were given a sedative. °· The dressing will be removed 24-48 hours after the procedure, or as told by your health care provider. °Summary °· A pacemaker battery usually lasts 5-15 years (6-7 years on average). °· When the battery is low, your pacemaker battery and generator will be completely replaced. °· Risks of this procedure include bleeding, bruising, infection, damage to other structures, pulling apart of the skin at the incision site, and allergic reactions to medicines or anesthetics. °· Most often, this procedure is simpler than the first surgery because the wires (leads) that connect the generator to the heart are already in place. °This information is not intended to replace advice given to you by your health care provider. Make sure you discuss any questions you have with your health care provider. °Document Released: 09/15/2006 Document Revised: 05/11/2016 Document Reviewed: 05/11/2016 °[employer] Interactive Patient Education © 2017 [employer] ° °

## 2017-02-04 NOTE — Progress Notes (Signed)
Patient ID: [name], female   DOB: 07/20/1931, 83 y.o.   MRN: [id number] Patient ID: [name], female   DOB: Oct 06, 1931, 83 y.o.   MRN: [id number]    Cardiology Office Note    Date:  02/04/2017   ID:  [name], DOB Sep 21, 1931, MRN [id number]  PCP:  Lorne Skeens, MD  Cardiologist: Shelva Majestic, M.D.;  Sanda Klein, MD   Chief Complaint  Patient presents with  . Follow-up  . Chest Pain  . Edema    Ankles a little every night.  Pacemaker has reached elective replacement indicator  History of Present Illness:  [name] is a 83 y.o. female who returns in follow-up for sinus node dysfunction, complete heart block, paroxysmal atrial .fibrillation and pacemaker check.    She reports increased fatigue for the last month. Pacemaker check performed on August 15 shows that she reached ERI on July 13. She was reprogrammed to AV synchronous node at the time of her pacemaker check. She can't say for sure that her fatigue has improved since then. She has a persistent nagging cough, which she has due to allergies around this time of the year every year.  The patient specifically denies any chest pain at rest exertion, dyspnea at rest or with exertion, orthopnea, paroxysmal nocturnal dyspnea, syncope, palpitations, focal neurological deficits, intermittent claudication, lower extremity edema, unexplained weight gain, cough, hemoptysis or wheezing. Edema is not currently a big problem.  Lead parameters are excellent. She has a Education officer, museum dual chamber, not amenable to remote monitoring. Her last pacemaker check she had 97% atrial pacing and virtually 100% ventricular pacing. Underlying rhythm was sinus bradycardia in the 40-50 range without any detectable ventricular activity.  Past Medical History:  Diagnosis Date  . Allergic rhinitis   . Asthmatic bronchitis   . Chronic cough   . Esophageal reflux   . GERD (gastroesophageal reflux disease)   . History of stress test  11/20/2009   This was essentially normal,post-stress EF was hyperdynamic at 73 beats per mins, There was no scar or ischemia.  Marland Kitchen Hx of echocardiogram 11/05/2010   EF>55% showed a normal systolic function with impaired diastolic dysfuntion and also tissue doppler suggesting increased elevated left atrial pressure, she had moderate LA dilatation and mild LA dilatation. There was calcified mitral apparatus with moderate MR, mild to moderate TR and trace aortic insufficiency.  . Hyperlipemia   . Hypertension   . PAF (paroxysmal atrial fibrillation) ([place])     Past Surgical History:  Procedure Laterality Date  . CHOLECYSTECTOMY    . FOOT SURGERY     right  . NASAL SEPTUM SURGERY    . PACEMAKER INSERTION  June 1st 2009   St Jude [place] device, model #5826, serial #[id number], The artial lead is a Interior and spatial designer, the ventricular lead is Environmental education officer.  . THYROID SURGERY    . TONSILLECTOMY    . VESICOVAGINAL FISTULA CLOSURE W/ TAH      Current Outpatient Prescriptions  Medication Sig Dispense Refill  . aspirin 81 MG tablet Take 81 mg by mouth daily.      . cholecalciferol (VITAMIN D) 1000 UNITS tablet Take 1,000 Units by mouth daily.      Marland Kitchen diltiazem (CARDIZEM CD) 180 MG 24 hr capsule TAKE 1 CAPSULE BY MOUTH DAILY. KEEP APPT. 30 capsule 6  . ipratropium-albuterol (DUONEB) 0.5-2.5 (3) MG/3ML SOLN 1 neb every 6 hours for cough or wheeze 150 mL 12  . LORazepam (ATIVAN) 0.5 MG  tablet Take 0.5 mg by mouth 2 (two) times daily as needed for anxiety.     . metoprolol succinate (TOPROL-XL) 25 MG 24 hr tablet Take 1 tablet (25 mg total) by mouth daily. 30 tablet 11  . MULTIPLE VITAMIN PO Take 1 tablet by mouth daily.    . pantoprazole (PROTONIX) 40 MG tablet Take 1 tablet by mouth daily before breakfast.    . pravastatin (PRAVACHOL) 40 MG tablet Take 1 tablet (40 mg total) by mouth daily. 90 tablet 3  . SYNTHROID 25 MCG tablet Take 1 tablet by mouth daily.  12  . valsartan-hydrochlorothiazide  (DIOVAN-HCT) 160-25 MG tablet TAKE 1 TABLET BY MOUTH DAILY. 30 tablet 5   No current facility-administered medications for this visit.     Allergies:   Fluticasone-salmeterol; Sulfonamide derivatives; and Erythromycin   Social History   Social History  . Marital status: Married    Spouse name: N/A  . Number of children: N/A  . Years of education: N/A   Occupational History  .  Retired    Retired Sales promotion account executive at [employer]  . Smoking status: Never Smoker  . Smokeless tobacco: Never Used  . Alcohol use No  . Drug use: No  . Sexual activity: Not Asked   Other Topics Concern  . None   Social History Narrative  . None     Family History:  The patient's family history includes Cancer in her father; Stroke in her mother and sister.   ROS:   Please see the history of present illness.    Review of Systems  Cardiovascular: Positive for dyspnea on exertion.  Musculoskeletal: Positive for arthritis, back pain, muscle cramps and stiffness.   All other systems reviewed and are negative.   PHYSICAL EXAM:   VS:  BP (!) 140/52   Pulse 68   Ht 4\' 11"  (1.499 m)   Wt 165 lb (74.8 kg)   BMI 33.33 kg/m     General: Alert, oriented x3, no distress, Mildly obese Head: no evidence of trauma, PERRL, EOMI, no exophtalmos or lid lag, no myxedema, no xanthelasma; normal ears, nose and oropharynx Neck: normal jugular venous pulsations and no hepatojugular reflux; brisk carotid pulses without delay and no carotid bruits Chest: clear to auscultation, no signs of consolidation by percussion or palpation, normal fremitus, symmetrical and full respiratory excursions Cardiovascular: normal position and quality of the apical impulse, regular rhythm, normal first and paradoxically split second heart sounds, no murmurs, rubs or gallops. Healthy right subclavian pacemaker site Abdomen: no tenderness or distention, no masses by palpation, no abnormal  pulsatility or arterial bruits, normal bowel sounds, no hepatosplenomegaly Extremities: no clubbing, cyanosis or edema; 2+ radial, ulnar and brachial pulses bilaterally; 2+ right femoral, posterior tibial and dorsalis pedis pulses; 2+ left femoral, posterior tibial and dorsalis pedis pulses; no subclavian or femoral bruits Neurological: grossly nonfocal Psych: Normal mood and affect    Wt Readings from Last 3 Encounters:  02/04/17 165 lb (74.8 kg)  10/29/16 168 lb 3.2 oz (76.3 kg)  08/31/16 168 lb 3.2 oz (76.3 kg)      Studies/Labs Reviewed:   EKG:  EKG is not ordered today.  The ekg ordered March 13 demonstrates 100% AV sequential pacing  Recent Labs: No results found for requested labs within last 8760 hours.   Lipid Panel    Component Value Date/Time   CHOL 136 03/13/2015 1436   TRIG 95 03/13/2015 1436   HDL 57  03/13/2015 1436   CHOLHDL 2.4 03/13/2015 1436   VLDL 19 03/13/2015 1436   LDLCALC 60 03/13/2015 1436    September 2017 Total cholesterol 155, triglycerides 121, HDL 61, LDL 70 May 2018 total cholesterol 151, triglycerides 82, HDL 55, LDL 86  ASSESSMENT:    1. CHB (complete heart block) (HCC)   2. Pacemaker battery depletion   3. Paroxysmal atrial fibrillation (HCC)   4. Pre-procedure lab exam   5. Essential hypertension   6. Pure hypercholesterolemia   7. Blood clotting disorder ([place])   8. Medication management      PLAN:  In order of problems listed above:  1. CHB: Pacemaker dependent. No underlying escape rhythm 2. PPM: GeneratorReached ERI on July 13. Fatigue was probably related to VVI mode. Now back in AV synchronous node. Schedule generator change out next week. Concern regarding risk of infection is heightened due to pacemaker dependent status. Recommend implantation of a Tyrx pouch. This procedure has been fully reviewed with the patient and informed consent has been obtained. 3. AFib: Atrial fibrillation has not been recorded in many years (at  least since 2012). She is only receiving aspirin and is not on anticoagulation.  4. HTN: excellent control 5. HLP: Most recent lipid profile with excellent parameters   Medication Adjustments/Labs and Tests Ordered: Current medicines are reviewed at length with the patient today.  Concerns regarding medicines are outlined above.  Medication changes, Labs and Tests ordered today are listed below. Patient Instructions  Pacemaker Battery Change A pacemaker battery usually lasts 5-15 years (6-7 years on average). A few times a year, you will be asked to visit your health care provider to have a full evaluation of your pacemaker. When the battery is low, your pacemaker battery and generator will be completely replaced. Most often, this procedure is simpler than the first surgery because the wires (leads) that connect the generator to the heart are already in place. There are many things that affect how long a pacemaker battery will last, including:  The age of the pacemaker.  The number of leads you have(1, 2, or 3).  The pacemaker workload. If the pacemaker is helping the heart more often, the battery will not last as long.  Power (voltage) settings.  Tell a health care provider about:  Any allergies you have.  All medicines you are taking, including vitamins, herbs, eye drops, creams, and over-the-counter medicines.  Any problems you or family members have had with anesthetic medicines.  Any blood disorders you have.  Any surgeries you have had, especially the surgeries you have had since your last pacemaker was placed.  Any medical conditions you have.  Whether you are pregnant or may be pregnant.  Any symptoms of heart problems, such as chest pain, trouble breathing, palpitations, light-headedness, or feelings of an abnormal or irregular heartbeat.  Smoking habits. This can affect your reaction to anesthesia. What are the risks? Generally, this is a safe procedure. However,  problems may occur, including:  Bleeding.  Bruising of the skin around where the surgical cut (incision) was made.  Pulling apart of the skin at the incision site.  Infection.  Nerve damage.  Injury to other organs, such as the lungs.  Allergic reaction to anesthetics or other medicines used during the procedure.  People with diabetes may have a temporary increase in blood sugar (glucose) after any surgical procedure.  What happens before the procedure? Staying hydrated Follow instructions from your health care provider about hydration, which may include:  Up to 2 hours before the procedure - you may continue to drink clear liquids, such as water, clear fruit juice, black coffee, and plain tea.  Eating and drinking restrictions Follow instructions from your health care provider about eating and drinking restrictions, which may include:  8 hours before the procedure - stop eating heavy meals or foods such as meat, fried foods, or fatty foods.  6 hours before the procedure - stop eating light meals or foods, such as toast or cereal.  6 hours before the procedure - stop drinking milk or drinks that contain milk.  2 hours before the procedure - stop drinking clear liquids.  General instructions  Ask your health care provider about: ? Changing or stopping your regular medicines. This is especially important if you are taking diabetes medicines or blood thinners. ? Taking medicines such as aspirin and ibuprofen. These medicines can thin your blood. Do not take these medicines before your procedure if your health care provider instructs you not to. ? Taking a sip of water with any approved medicines on the morning of the procedure.  Plan to have someone take you home after the procedure. What happens during the procedure?  To reduce your risk of infection: ? Your health care team will wash or sanitize their hands. ? The skin around the area of the chest will be washed with  soap. ? Hair may be removed from the surgical area.  An IV tube will be inserted into one your veins to give you medicine and fluids.  You will be given one or more of the following: ? A medicine to help you relax (sedative). ? A medicine to numb the area where the pacemaker is located (local anesthetic).  You may be given antibiotic medicine to prevent infection.  Your health care provider will make an incision to reopen the pocket holding the pacemaker.  The old pacemaker will be disconnected from the leads.  The leads will be tested.  If needed, the leads will be replaced. If the leads are functioning properly, the new pacemaker will be connected to the existing leads.  A heart monitor and the pacemaker programmer will be used to make sure that the newly implanted pacemaker is working properly.  The incision site will be closed. A bandage (dressing) will be placed over the pacemaker site. The procedure may vary among health care providers and hospitals. What happens after the procedure?  Your blood pressure, heart rate, breathing rate, and blood oxygen level will be monitored until your health care team is satisfied that your pacemaker is working properly.  Your health care provider will tell you when your pacemaker will need to be tested again, or when to return to the office for removal of dressing and stitches.  Do not drive for 24 hours if you were given a sedative.  The dressing will be removed 24-48 hours after the procedure, or as told by your health care provider. Summary  A pacemaker battery usually lasts 5-15 years (6-7 years on average).  When the battery is low, your pacemaker battery and generator will be completely replaced.  Risks of this procedure include bleeding, bruising, infection, damage to other structures, pulling apart of the skin at the incision site, and allergic reactions to medicines or anesthetics.  Most often, this procedure is simpler than the  first surgery because the wires (leads) that connect the generator to the heart are already in place. This information is not intended to replace advice given to  you by your health care provider. Make sure you discuss any questions you have with your health care provider. Document Released: 09/15/2006 Document Revised: 05/11/2016 Document Reviewed: 05/11/2016 Elsevier Interactive Patient Education  2017 [place], Sanda Klein, MD  02/04/2017 4:26 PM    [address], [place], [place]  [address] Phone: [phone]; Fax: [phone]

## 2017-02-05 LAB — PROTIME-INR
INR: 1 (ref 0.8–1.2)
PROTHROMBIN TIME: 10.2 s (ref 9.1–12.0)

## 2017-02-05 LAB — CBC
HEMATOCRIT: 34.4 % (ref 34.0–46.6)
Hemoglobin: 11.4 g/dL (ref 11.1–15.9)
MCH: 29.8 pg (ref 26.6–33.0)
MCHC: 33.1 g/dL (ref 31.5–35.7)
MCV: 90 fL (ref 79–97)
PLATELETS: 222 10*3/uL (ref 150–379)
RBC: 3.83 x10E6/uL (ref 3.77–5.28)
RDW: 13.6 % (ref 12.3–15.4)
WBC: 6 10*3/uL (ref 3.4–10.8)

## 2017-02-05 LAB — BASIC METABOLIC PANEL
BUN / CREAT RATIO: 15 (ref 12–28)
BUN: 18 mg/dL (ref 8–27)
CO2: 24 mmol/L (ref 20–29)
CREATININE: 1.17 mg/dL — AB (ref 0.57–1.00)
Calcium: 9 mg/dL (ref 8.7–10.3)
Chloride: 99 mmol/L (ref 96–106)
GFR calc Af Amer: 50 mL/min/{1.73_m2} — ABNORMAL LOW (ref >59)
GFR calc non Af Amer: 43 mL/min/{1.73_m2} — ABNORMAL LOW (ref >59)
GLUCOSE: 120 mg/dL — AB (ref 65–99)
POTASSIUM: 4.1 mmol/L (ref 3.5–5.2)
SODIUM: 138 mmol/L (ref 134–144)

## 2017-02-09 ENCOUNTER — Other Ambulatory Visit: Payer: Self-pay | Admitting: Cardiovascular Disease

## 2017-02-09 ENCOUNTER — Encounter (HOSPITAL_COMMUNITY)
Admission: RE | Disposition: A | Discharge status: Home / Self Care | Payer: Self-pay | Source: Ambulatory Visit | Attending: Cardiovascular Disease

## 2017-02-09 ENCOUNTER — Ambulatory Visit (HOSPITAL_COMMUNITY)
Admission: RE | Admit: 2017-02-09 | Discharge: 2017-02-09 | Disposition: A | Discharge status: Home / Self Care | Payer: Medicare Other | Source: Ambulatory Visit | Attending: Cardiovascular Disease | Admitting: Cardiovascular Disease

## 2017-02-09 DIAGNOSIS — Z45010 Encounter for checking and testing of cardiac pacemaker pulse generator [battery]: Secondary | ICD-10-CM | POA: Diagnosis not present

## 2017-02-09 DIAGNOSIS — I495 Sick sinus syndrome: Secondary | ICD-10-CM | POA: Insufficient documentation

## 2017-02-09 DIAGNOSIS — I10 Essential (primary) hypertension: Secondary | ICD-10-CM | POA: Insufficient documentation

## 2017-02-09 DIAGNOSIS — K219 Gastro-esophageal reflux disease without esophagitis: Secondary | ICD-10-CM | POA: Diagnosis not present

## 2017-02-09 DIAGNOSIS — I480 Paroxysmal atrial fibrillation: Secondary | ICD-10-CM | POA: Diagnosis not present

## 2017-02-09 DIAGNOSIS — I442 Atrioventricular block, complete: Secondary | ICD-10-CM | POA: Insufficient documentation

## 2017-02-09 DIAGNOSIS — Z950 Presence of cardiac pacemaker: Secondary | ICD-10-CM | POA: Diagnosis not present

## 2017-02-09 DIAGNOSIS — D689 Coagulation defect, unspecified: Secondary | ICD-10-CM | POA: Insufficient documentation

## 2017-02-09 DIAGNOSIS — E7800 Pure hypercholesterolemia, unspecified: Secondary | ICD-10-CM | POA: Diagnosis not present

## 2017-02-09 DIAGNOSIS — Z01818 Encounter for other preprocedural examination: Secondary | ICD-10-CM | POA: Diagnosis not present

## 2017-02-09 DIAGNOSIS — Z882 Allergy status to sulfonamides status: Secondary | ICD-10-CM | POA: Diagnosis not present

## 2017-02-09 DIAGNOSIS — Z7982 Long term (current) use of aspirin: Secondary | ICD-10-CM | POA: Insufficient documentation

## 2017-02-09 HISTORY — PX: PPM GENERATOR CHANGEOUT: EP1233

## 2017-02-09 LAB — SURGICAL PCR SCREEN
MRSA, PCR: NEGATIVE
STAPHYLOCOCCUS AUREUS: NEGATIVE

## 2017-02-09 SURGERY — PPM GENERATOR CHANGEOUT

## 2017-02-09 MED ORDER — LIDOCAINE HCL (PF) 1 % IJ SOLN
INTRAMUSCULAR | Status: DC | PRN
  Administered 2017-02-09: 20 mL

## 2017-02-09 MED ORDER — SODIUM CHLORIDE 0.9 % IR SOLN: 80.0000 mg | Status: DC

## 2017-02-09 MED ORDER — SODIUM CHLORIDE 0.9 % IV SOLN
INTRAVENOUS | Status: DC
  Administered 2017-02-09: 12:00:00 via INTRAVENOUS

## 2017-02-09 MED ORDER — CEFAZOLIN SODIUM-DEXTROSE 2-3 GM-% IV SOLR
INTRAVENOUS | Status: AC | PRN
  Administered 2017-02-09: 2 g via INTRAVENOUS

## 2017-02-09 MED ORDER — MIDAZOLAM HCL 5 MG/5ML IJ SOLN
INTRAMUSCULAR | Status: DC | PRN
  Administered 2017-02-09: 1 mg via INTRAVENOUS

## 2017-02-09 MED ORDER — FENTANYL CITRATE (PF) 100 MCG/2ML IJ SOLN
INTRAMUSCULAR | Status: DC | PRN
  Administered 2017-02-09: 25 ug via INTRAVENOUS

## 2017-02-09 MED ORDER — CEFAZOLIN SODIUM-DEXTROSE 2-4 GM/100ML-% IV SOLN: 2.0000 g | INTRAVENOUS | Status: DC

## 2017-02-09 MED ORDER — CHLORHEXIDINE GLUCONATE 4 % EX LIQD
60.0000 mL | Freq: Once | CUTANEOUS | Status: DC
  Filled 2017-02-09: qty 60

## 2017-02-09 MED ORDER — MIDAZOLAM HCL 5 MG/5ML IJ SOLN
INTRAMUSCULAR | Status: AC
  Filled 2017-02-09: qty 5

## 2017-02-09 MED ORDER — CEFAZOLIN SODIUM-DEXTROSE 2-4 GM/100ML-% IV SOLN
INTRAVENOUS | Status: AC
  Filled 2017-02-09: qty 100

## 2017-02-09 MED ORDER — SODIUM CHLORIDE 0.9 % IR SOLN
Status: DC | PRN
  Administered 2017-02-09: 16:00:00

## 2017-02-09 MED ORDER — FENTANYL CITRATE (PF) 100 MCG/2ML IJ SOLN
INTRAMUSCULAR | Status: AC
  Filled 2017-02-09: qty 2

## 2017-02-09 MED ORDER — LIDOCAINE HCL (PF) 1 % IJ SOLN
INTRAMUSCULAR | Status: AC
  Filled 2017-02-09: qty 60

## 2017-02-09 MED ORDER — MUPIROCIN 2 % EX OINT
TOPICAL_OINTMENT | CUTANEOUS | Status: AC
  Administered 2017-02-09: 1
  Filled 2017-02-09: qty 22

## 2017-02-09 MED ORDER — GENTAMICIN SULFATE 40 MG/ML IJ SOLN
INTRAMUSCULAR | Status: AC
  Filled 2017-02-09: qty 2

## 2017-02-09 SURGICAL SUPPLY — 6 items
CABLE SURGICAL S-101-97-12 (CABLE) ×2 IMPLANT
PACEMAKER ASSURITY DR-RF (Pacemaker) ×2 IMPLANT
PAD DEFIB LIFELINK (PAD) ×2 IMPLANT
POUCH AIGIS-R ANTIBACT ICD (Mesh General) ×3 IMPLANT
POUCH AIGIS-R ANTIBACT ICD LRG (Mesh General) IMPLANT
TRAY PACEMAKER INSERTION (PACKS) ×2 IMPLANT

## 2017-02-09 NOTE — H&P (View-Only) (Signed)
Patient ID: [name], female   DOB: November 26, 1931, 83 y.o.   MRN: [id number] Patient ID: [name], female   DOB: [birth year], 83 y.o.   MRN: [id number]    Cardiology Office Note    Date:  02/04/2017   ID:  [name], DOB 1931/09/01, MRN [id number]  PCP:  Lorne Skeens, MD  Cardiologist: Shelva Majestic, M.D.;  Sanda Klein, MD   Chief Complaint  Patient presents with  . Follow-up  . Chest Pain  . Edema    Ankles a little every night.  Pacemaker has reached elective replacement indicator  History of Present Illness:  [name] is a 83 y.o. female who returns in follow-up for sinus node dysfunction, complete heart block, paroxysmal atrial .fibrillation and pacemaker check.    She reports increased fatigue for the last month. Pacemaker check performed on August 15 shows that she reached ERI on July 13. She was reprogrammed to AV synchronous node at the time of her pacemaker check. She can't say for sure that her fatigue has improved since then. She has a persistent nagging cough, which she has due to allergies around this time of the year every year.  The patient specifically denies any chest pain at rest exertion, dyspnea at rest or with exertion, orthopnea, paroxysmal nocturnal dyspnea, syncope, palpitations, focal neurological deficits, intermittent claudication, lower extremity edema, unexplained weight gain, cough, hemoptysis or wheezing. Edema is not currently a big problem.  Lead parameters are excellent. She has a Education officer, museum dual chamber, not amenable to remote monitoring. Her last pacemaker check she had 97% atrial pacing and virtually 100% ventricular pacing. Underlying rhythm was sinus bradycardia in the 40-50 range without any detectable ventricular activity.  Past Medical History:  Diagnosis Date  . Allergic rhinitis   . Asthmatic bronchitis   . Chronic cough   . Esophageal reflux   . GERD (gastroesophageal reflux disease)   . History of stress test  11/20/2009   This was essentially normal,post-stress EF was hyperdynamic at 73 beats per mins, There was no scar or ischemia.  Marland Kitchen Hx of echocardiogram 11/05/2010   EF>55% showed a normal systolic function with impaired diastolic dysfuntion and also tissue doppler suggesting increased elevated left atrial pressure, she had moderate LA dilatation and mild LA dilatation. There was calcified mitral apparatus with moderate MR, mild to moderate TR and trace aortic insufficiency.  . Hyperlipemia   . Hypertension   . PAF (paroxysmal atrial fibrillation) ([place])     Past Surgical History:  Procedure Laterality Date  . CHOLECYSTECTOMY    . FOOT SURGERY     right  . NASAL SEPTUM SURGERY    . PACEMAKER INSERTION  June 1st 2009   St Jude [place] device, model #5826, serial #[id number], The artial lead is a Interior and spatial designer, the ventricular lead is Environmental education officer.  . THYROID SURGERY    . TONSILLECTOMY    . VESICOVAGINAL FISTULA CLOSURE W/ TAH      Current Outpatient Prescriptions  Medication Sig Dispense Refill  . aspirin 81 MG tablet Take 81 mg by mouth daily.      . cholecalciferol (VITAMIN D) 1000 UNITS tablet Take 1,000 Units by mouth daily.      Marland Kitchen diltiazem (CARDIZEM CD) 180 MG 24 hr capsule TAKE 1 CAPSULE BY MOUTH DAILY. KEEP APPT. 30 capsule 6  . ipratropium-albuterol (DUONEB) 0.5-2.5 (3) MG/3ML SOLN 1 neb every 6 hours for cough or wheeze 150 mL 12  . LORazepam (ATIVAN) 0.5 MG  tablet Take 0.5 mg by mouth 2 (two) times daily as needed for anxiety.     . metoprolol succinate (TOPROL-XL) 25 MG 24 hr tablet Take 1 tablet (25 mg total) by mouth daily. 30 tablet 11  . MULTIPLE VITAMIN PO Take 1 tablet by mouth daily.    . pantoprazole (PROTONIX) 40 MG tablet Take 1 tablet by mouth daily before breakfast.    . pravastatin (PRAVACHOL) 40 MG tablet Take 1 tablet (40 mg total) by mouth daily. 90 tablet 3  . SYNTHROID 25 MCG tablet Take 1 tablet by mouth daily.  12  . valsartan-hydrochlorothiazide  (DIOVAN-HCT) 160-25 MG tablet TAKE 1 TABLET BY MOUTH DAILY. 30 tablet 5   No current facility-administered medications for this visit.     Allergies:   Fluticasone-salmeterol; Sulfonamide derivatives; and Erythromycin   Social History   Social History  . Marital status: Married    Spouse name: N/A  . Number of children: N/A  . Years of education: N/A   Occupational History  .  Retired    Retired Sales promotion account executive at [employer]  . Smoking status: Never Smoker  . Smokeless tobacco: Never Used  . Alcohol use No  . Drug use: No  . Sexual activity: Not Asked   Other Topics Concern  . None   Social History Narrative  . None     Family History:  The patient's family history includes Cancer in her father; Stroke in her mother and sister.   ROS:   Please see the history of present illness.    Review of Systems  Cardiovascular: Positive for dyspnea on exertion.  Musculoskeletal: Positive for arthritis, back pain, muscle cramps and stiffness.   All other systems reviewed and are negative.   PHYSICAL EXAM:   VS:  BP (!) 140/52   Pulse 68   Ht 4\' 11"  (1.499 m)   Wt 165 lb (74.8 kg)   BMI 33.33 kg/m     General: Alert, oriented x3, no distress, Mildly obese Head: no evidence of trauma, PERRL, EOMI, no exophtalmos or lid lag, no myxedema, no xanthelasma; normal ears, nose and oropharynx Neck: normal jugular venous pulsations and no hepatojugular reflux; brisk carotid pulses without delay and no carotid bruits Chest: clear to auscultation, no signs of consolidation by percussion or palpation, normal fremitus, symmetrical and full respiratory excursions Cardiovascular: normal position and quality of the apical impulse, regular rhythm, normal first and paradoxically split second heart sounds, no murmurs, rubs or gallops. Healthy right subclavian pacemaker site Abdomen: no tenderness or distention, no masses by palpation, no abnormal  pulsatility or arterial bruits, normal bowel sounds, no hepatosplenomegaly Extremities: no clubbing, cyanosis or edema; 2+ radial, ulnar and brachial pulses bilaterally; 2+ right femoral, posterior tibial and dorsalis pedis pulses; 2+ left femoral, posterior tibial and dorsalis pedis pulses; no subclavian or femoral bruits Neurological: grossly nonfocal Psych: Normal mood and affect    Wt Readings from Last 3 Encounters:  02/04/17 165 lb (74.8 kg)  10/29/16 168 lb 3.2 oz (76.3 kg)  08/31/16 168 lb 3.2 oz (76.3 kg)      Studies/Labs Reviewed:   EKG:  EKG is not ordered today.  The ekg ordered March 13 demonstrates 100% AV sequential pacing  Recent Labs: No results found for requested labs within last 8760 hours.   Lipid Panel    Component Value Date/Time   CHOL 136 03/13/2015 1436   TRIG 95 03/13/2015 1436   HDL 57  03/13/2015 1436   CHOLHDL 2.4 03/13/2015 1436   VLDL 19 03/13/2015 1436   LDLCALC 60 03/13/2015 1436    September 2017 Total cholesterol 155, triglycerides 121, HDL 61, LDL 70 May 2018 total cholesterol 151, triglycerides 82, HDL 55, LDL 86  ASSESSMENT:    1. CHB (complete heart block) (H[facility])   2. Pacemaker battery depletion   3. Paroxysmal atrial fibrillation (H[facility])   4. Pre-procedure lab exam   5. Essential hypertension   6. Pure hypercholesterolemia   7. Blood clotting disorder ([place])   8. Medication management      PLAN:  In order of problems listed above:  1. CHB: Pacemaker dependent. No underlying escape rhythm 2. PPM: GeneratorReached ERI on July 13. Fatigue was probably related to VVI mode. Now back in AV synchronous node. Schedule generator change out next week. Concern regarding risk of infection is heightened due to pacemaker dependent status. Recommend implantation of a Tyrx pouch. This procedure has been fully reviewed with the patient and informed consent has been obtained. 3. AFib: Atrial fibrillation has not been recorded in many years (at  least since 2012). She is only receiving aspirin and is not on anticoagulation.  4. HTN: excellent control 5. HLP: Most recent lipid profile with excellent parameters   Medication Adjustments/Labs and Tests Ordered: Current medicines are reviewed at length with the patient today.  Concerns regarding medicines are outlined above.  Medication changes, Labs and Tests ordered today are listed below. Patient Instructions  Pacemaker Battery Change A pacemaker battery usually lasts 5-15 years (6-7 years on average). A few times a year, you will be asked to visit your health care provider to have a full evaluation of your pacemaker. When the battery is low, your pacemaker battery and generator will be completely replaced. Most often, this procedure is simpler than the first surgery because the wires (leads) that connect the generator to the heart are already in place. There are many things that affect how long a pacemaker battery will last, including:  The age of the pacemaker.  The number of leads you have(1, 2, or 3).  The pacemaker workload. If the pacemaker is helping the heart more often, the battery will not last as long.  Power (voltage) settings.  Tell a health care provider about:  Any allergies you have.  All medicines you are taking, including vitamins, herbs, eye drops, creams, and over-the-counter medicines.  Any problems you or family members have had with anesthetic medicines.  Any blood disorders you have.  Any surgeries you have had, especially the surgeries you have had since your last pacemaker was placed.  Any medical conditions you have.  Whether you are pregnant or may be pregnant.  Any symptoms of heart problems, such as chest pain, trouble breathing, palpitations, light-headedness, or feelings of an abnormal or irregular heartbeat.  Smoking habits. This can affect your reaction to anesthesia. What are the risks? Generally, this is a safe procedure. However,  problems may occur, including:  Bleeding.  Bruising of the skin around where the surgical cut (incision) was made.  Pulling apart of the skin at the incision site.  Infection.  Nerve damage.  Injury to other organs, such as the lungs.  Allergic reaction to anesthetics or other medicines used during the procedure.  People with diabetes may have a temporary increase in blood sugar (glucose) after any surgical procedure.  What happens before the procedure? Staying hydrated Follow instructions from your health care provider about hydration, which may include:  Up to 2 hours before the procedure - you may continue to drink clear liquids, such as water, clear fruit juice, black coffee, and plain tea.  Eating and drinking restrictions Follow instructions from your health care provider about eating and drinking restrictions, which may include:  8 hours before the procedure - stop eating heavy meals or foods such as meat, fried foods, or fatty foods.  6 hours before the procedure - stop eating light meals or foods, such as toast or cereal.  6 hours before the procedure - stop drinking milk or drinks that contain milk.  2 hours before the procedure - stop drinking clear liquids.  General instructions  Ask your health care provider about: ? Changing or stopping your regular medicines. This is especially important if you are taking diabetes medicines or blood thinners. ? Taking medicines such as aspirin and ibuprofen. These medicines can thin your blood. Do not take these medicines before your procedure if your health care provider instructs you not to. ? Taking a sip of water with any approved medicines on the morning of the procedure.  Plan to have someone take you home after the procedure. What happens during the procedure?  To reduce your risk of infection: ? Your health care team will wash or sanitize their hands. ? The skin around the area of the chest will be washed with  soap. ? Hair may be removed from the surgical area.  An IV tube will be inserted into one your veins to give you medicine and fluids.  You will be given one or more of the following: ? A medicine to help you relax (sedative). ? A medicine to numb the area where the pacemaker is located (local anesthetic).  You may be given antibiotic medicine to prevent infection.  Your health care provider will make an incision to reopen the pocket holding the pacemaker.  The old pacemaker will be disconnected from the leads.  The leads will be tested.  If needed, the leads will be replaced. If the leads are functioning properly, the new pacemaker will be connected to the existing leads.  A heart monitor and the pacemaker programmer will be used to make sure that the newly implanted pacemaker is working properly.  The incision site will be closed. A bandage (dressing) will be placed over the pacemaker site. The procedure may vary among health care providers and hospitals. What happens after the procedure?  Your blood pressure, heart rate, breathing rate, and blood oxygen level will be monitored until your health care team is satisfied that your pacemaker is working properly.  Your health care provider will tell you when your pacemaker will need to be tested again, or when to return to the office for removal of dressing and stitches.  Do not drive for 24 hours if you were given a sedative.  The dressing will be removed 24-48 hours after the procedure, or as told by your health care provider. Summary  A pacemaker battery usually lasts 5-15 years (6-7 years on average).  When the battery is low, your pacemaker battery and generator will be completely replaced.  Risks of this procedure include bleeding, bruising, infection, damage to other structures, pulling apart of the skin at the incision site, and allergic reactions to medicines or anesthetics.  Most often, this procedure is simpler than the  first surgery because the wires (leads) that connect the generator to the heart are already in place. This information is not intended to replace advice given to  you by your health care provider. Make sure you discuss any questions you have with your health care provider. Document Released: 09/15/2006 Document Revised: 05/11/2016 Document Reviewed: 05/11/2016 Elsevier Interactive Patient Education  2017 [place], Sanda Klein, MD  02/04/2017 4:26 PM    [facility], [place], [place]  [id number] Phone: [phone]; Fax: [phone]

## 2017-02-09 NOTE — Discharge Instructions (Signed)
Supplemental Discharge Instructions for  Pacemaker/Defibrillator Patients  Activity No riving today. No restrictions starting tomorrow.  DO wear your seatbelt, even if it crosses over the pacemaker site.  WOUND CARE - Keep the wound area clean and dry.  Remove the dressing the day after you return home (usually 48 hours after the procedure). - DO NOT SUBMERGE UNDER WATER UNTIL FULLY HEALED (no tub baths, hot tubs, swimming pools, etc.).  - You  may shower or take a sponge bath after the dressing is removed. DO NOT SOAK the area and do not allow the shower to directly spray on the site. - If you have staples, these will be removed in the office in 7-14 days. - If you have tape/steri-strips on your wound, these will fall off; do not pull them off prematurely.   - No bandage is needed on the site.  DO  NOT apply any creams, oils, or ointments to the wound area. - If you notice any drainage or discharge from the wound, any swelling, excessive redness or bruising at the site, or if you develop a fever > 101? F after you are discharged home, call the office at once.  Special Instructions - You are still able to use cellular telephones.  Avoid carrying your cellular phone near your device. - When traveling through airports, show security personnel your identification card to avoid being screened in the metal detectors.  - Avoid arc welding equipment, MRI testing (magnetic resonance imaging), TENS units (transcutaneous nerve stimulators).  Call the office for questions about other devices. - Avoid electrical appliances that are in poor condition or are not properly grounded. - Microwave ovens are safe to be near or to operate.   Pacemaker Battery Change, Care After This sheet gives you information about how to care for yourself after your procedure. Your health care provider may also give you more specific instructions. If you have problems or questions, contact your health care provider. What can  I expect after the procedure? After your procedure, it is common to have: Pain or soreness at the site where the pacemaker was inserted. Swelling at the site where the pacemaker was inserted.  Follow these instructions at home: Incision care Keep the incision clean and dry. Do not take baths, swim, or use a hot tub until your health care provider approves. You may shower the day after your procedure, or as directed by your health care provider. Pat the area dry with a clean towel. Do not rub the area. This may cause bleeding. Follow instructions from your health care provider about how to take care of your incision. Make sure you: Wash your hands with soap and water before you change your bandage (dressing). If soap and water are not available, use hand sanitizer. Change your dressing as told by your health care provider. Leave stitches (sutures), skin glue, or adhesive strips in place. These skin closures may need to stay in place for 2 weeks or longer. If adhesive strip edges start to loosen and curl up, you may trim the loose edges. Do not remove adhesive strips completely unless your health care provider tells you to do that. Check your incision area every day for signs of infection. Check for: More redness, swelling, or pain. More fluid or blood. Warmth. Pus or a bad smell. Activity Do not lift anything that is heavier than 10 lb (4.5 kg) until your health care provider says it is okay to do so. For the first 2 weeks, or as  long as told by your health care provider: Avoid lifting your left arm higher than your shoulder. Be gentle when you move your arms over your head. It is okay to raise your arm to comb your hair. Avoid strenuous exercise. Ask your health care provider when it is okay to: Resume your normal activities. Return to work or school. Resume sexual activity. Eating and drinking Eat a heart-healthy diet. This should include plenty of fresh fruits and vegetables, whole  grains, low-fat dairy products, and lean protein like chicken and fish. Limit alcohol intake to no more than 1 drink a day for non-pregnant women and 2 drinks a day for men. One drink equals 12 oz of beer, 5 oz of wine, or 1 oz of hard liquor. Check ingredients and nutrition facts on packaged foods and beverages. Avoid the following types of food: Food that is high in salt (sodium). Food that is high in saturated fat, like full-fat dairy or red meat. Food that is high in trans fat, like fried food. Food and drinks that are high in sugar. Lifestyle Do not use any products that contain nicotine or tobacco, such as cigarettes and e-cigarettes. If you need help quitting, ask your health care provider. Take steps to manage and control your weight. Get regular exercise. Aim for 150 minutes of moderate-intensity exercise (such as walking or yoga) or 75 minutes of vigorous exercise (such as running or swimming) each week. Manage other health problems, such as diabetes or high blood pressure. Ask your health care provider how you can manage these conditions. General instructions Do not drive for 24 hours after your procedure if you were given a medicine to help you relax (sedative). Take over-the-counter and prescription medicines only as told by your health care provider. Avoid putting pressure on the area where the pacemaker was placed. If you need an MRI after your pacemaker has been placed, be sure to tell the health care provider who orders the MRI that you have a pacemaker. Avoid close and prolonged exposure to electrical devices that have strong magnetic fields. These include: Cell phones. Avoid keeping them in a pocket near the pacemaker, and try using the ear opposite the pacemaker. MP3 players. Household appliances, like microwaves. Metal detectors. Electric generators. High-tension wires. Keep all follow-up visits as directed by your health care provider. This is important. Contact a  health care provider if: You have pain at the incision site that is not relieved by over-the-counter or prescription medicines. You have any of these around your incision site or coming from it: More redness, swelling, or pain. Fluid or blood. Warmth to the touch. Pus or a bad smell. You have a fever. You feel brief, occasional palpitations, light-headedness, or any symptoms that you think might be related to your heart. Get help right away if: You experience chest pain that is different from the pain at the pacemaker site. You develop a red streak that extends above or below the incision site. You experience shortness of breath. You have palpitations or an irregular heartbeat. You have light-headedness that does not go away quickly. You faint or have dizzy spells. Your pulse suddenly drops or increases rapidly and does not return to normal. You begin to gain weight and your legs and ankles swell. Summary After your procedure, it is common to have pain, soreness, and some swelling where the pacemaker was inserted. Make sure to keep your incision clean and dry. Follow instructions from your health care provider about how to take care  of your incision. Check your incision every day for signs of infection, such as more pain or swelling, pus or a bad smell, warmth, or leaking fluid and blood. Avoid strenuous exercise and lifting your left arm higher than your shoulder for 2 weeks, or as long as told by your health care provider. This information is not intended to replace advice given to you by your health care provider. Make sure you discuss any questions you have with your health care provider. Document Released: 03/28/2013 Document Revised: 04/29/2016 Document Reviewed: 04/29/2016 Elsevier Interactive Patient Education  2017 [employer]. -

## 2017-02-09 NOTE — Progress Notes (Signed)
Pt ate breakfast this am around 0745

## 2017-02-09 NOTE — Op Note (Signed)
Procedure performed:  1. Dual chamber pacemaker generator changeout  2. Light sedation  Reason for procedure:  1. Device generator at elective replacement interval  2. Complete heart block Procedure performed by:  Sanda Klein, MD  Complications:  None  Estimated blood loss:  <5 mL  Medications administered during procedure:  Ancef 2 g intravenously, lidocaine 1% 30 mL locally, fentanyl 25 mcg intravenously, Versed 1 mg intravenously  During this procedure the patient is administered a total of Versed 1 mg and Fentanyl 25 mcg to achieve and maintain moderate conscious sedation.  The patient's heart rate, blood pressure, and oxygen saturation are monitored continuously during the procedure. The period of conscious sedation is 26 minutes, of which I was present face-to-face 100% of this time.  Device details:   Hadley, model number 2272, serial number [id number] Right atrial lead (chronic) St. Jude, model number 6644I, serial [id number] (implanted 11/20/2007) Right ventricular lead (chronic)  St. Jude, model number 1688TC, serial number [id number] (implanted 11/20/2007)  Explanted generator St. Jude Zephyr [id number], serial number  [id number] (implanted 11/20/2007)  Procedure details:  After the risks and benefits of the procedure were discussed the patient provided informed consent. She was brought to the cardiac catheter lab in the fasting state. The patient was prepped and draped in usual sterile fashion. Local anesthesia with 1% lidocaine was administered to to the left infraclavicular area. A 5-6cm horizontal incision was made parallel with and 2-3 cm caudal to the right clavicle,an older scar was present 1 cm closer to the right clavicle. Using minimal electrocautery and mostly sharp and blunt dissection the prepectoral pocket was opened carefully to avoid injury to the loops of chronic leads. Extensive dissection was not necessary. The device was explanted. The pocket  was carefully inspected for hemostasis and flushed with copious amounts of antibiotic solution.  The ventricular lead was disconnected from the old generator and immediately connected to the new generator. Subsequently the atrial lead was also replaced. Telemetry testing of the lead parameters showed excellent values. The device was placed in a TYRX pouch. The entire system was then carefully inserted in the pocket with care been taking that the leads and device assumed a comfortable position without pressure on the incision. Great care was taken that the leads be located deep to the generator. The pocket was then closed in layers using 2 layers of 2-0 Vicryl and cutaneous staples after which a sterile dressing was applied.   At the end of the procedure the following lead parameters were encountered:   Right atrial lead sensed P waves 3.7 mV, impedance 510 ohms, threshold 0.5 at 0.4 ms pulse width.  Right ventricular lead sensed R waves  None, impedance 450 ohms, threshold 0.75 at 0.4 ms pulse width.

## 2017-02-09 NOTE — Interval H&P Note (Signed)
History and Physical Interval Note:  02/09/2017 12:49 PM  [name]  has presented today for surgery, with the diagnosis of ERI  The various methods of treatment have been discussed with the patient and family. After consideration of risks, benefits and other options for treatment, the patient has consented to  Procedure(s): PPM GENERATOR CHANGEOUT (N/A) as a surgical intervention .  The patient's history has been reviewed, patient examined, no change in status, stable for surgery.  I have reviewed the patient's chart and labs.  Questions were answered to the patient's satisfaction.     [name]

## 2017-02-10 ENCOUNTER — Telehealth: Payer: Self-pay | Admitting: Cardiovascular Disease

## 2017-02-10 ENCOUNTER — Encounter (HOSPITAL_COMMUNITY): Payer: Self-pay | Admitting: Cardiovascular Disease

## 2017-02-10 NOTE — Telephone Encounter (Signed)
Spoke to patient about ppm site. Pt states that she believes she's having a reaction from the tape over her site. Patient states that her site has been itching. I told patient that she could take the bandage off today and take some Benadryl for the itching. Patient states that she can tolerate the itching without taking the Benadryl. I told her to call back if her sx's worsen after removing the bandage. Patient verbalized understanding.

## 2017-02-10 NOTE — Telephone Encounter (Signed)
New message  Pt call requesting to speak with RN about the tape on her bandage for replacement pacemaker. Pt states she has never had this problem.please call back to discuss

## 2017-02-17 DIAGNOSIS — J301 Allergic rhinitis due to pollen: Secondary | ICD-10-CM | POA: Diagnosis not present

## 2017-02-17 DIAGNOSIS — J3089 Other allergic rhinitis: Secondary | ICD-10-CM | POA: Diagnosis not present

## 2017-02-24 ENCOUNTER — Ambulatory Visit (INDEPENDENT_AMBULATORY_CARE_PROVIDER_SITE_OTHER): Payer: Medicare Other | Admitting: *Deleted

## 2017-02-24 DIAGNOSIS — I442 Atrioventricular block, complete: Secondary | ICD-10-CM | POA: Diagnosis not present

## 2017-02-24 LAB — CUP PACEART INCLINIC DEVICE CHECK
Brady Statistic RV Percent Paced: 99.99 %
Implantable Lead Implant Date: 20090601
Implantable Lead Location: 753859
Lead Channel Impedance Value: 425 Ohm
Lead Channel Pacing Threshold Amplitude: 0.5 V
Lead Channel Pacing Threshold Amplitude: 0.5 V
Lead Channel Pacing Threshold Pulse Width: 0.4 ms
Lead Channel Sensing Intrinsic Amplitude: 3.1 mV
Lead Channel Setting Pacing Amplitude: 0.75 V
Lead Channel Setting Pacing Amplitude: 1.5 V
Lead Channel Setting Sensing Sensitivity: 4 mV
MDC IDC LEAD IMPLANT DT: 20090605
MDC IDC LEAD LOCATION: 753860
MDC IDC MSMT BATTERY REMAINING LONGEVITY: 120 mo
MDC IDC MSMT BATTERY VOLTAGE: 3.05 V
MDC IDC MSMT LEADCHNL RA IMPEDANCE VALUE: 550 Ohm
MDC IDC MSMT LEADCHNL RV PACING THRESHOLD PULSEWIDTH: 0.4 ms
MDC IDC PG IMPLANT DT: 20180822
MDC IDC SESS DTM: 20180906103932
MDC IDC SET LEADCHNL RV PACING PULSEWIDTH: 0.4 ms
MDC IDC STAT BRADY RA PERCENT PACED: 86 %
Pulse Gen Serial Number: 8932617

## 2017-02-24 NOTE — Progress Notes (Signed)
Wound check appointment s/p gen change. Staples removed. Wound without redness or edema. Incision edges approximated, wound well healed. Normal device function. Thresholds, sensing, and impedances consistent with implant measurements. Device programmed at chronic outputs s/p gen change. Histogram distribution appropriate for patient and level of activity. No mode switches or high ventricular rates noted. Patient educated about wound care, arm mobility, lifting restrictions. ROV with [facility] 3 months s/p gen change, Message sent to Brunetta Genera to schedule appointment.

## 2017-03-08 DIAGNOSIS — H04123 Dry eye syndrome of bilateral lacrimal glands: Secondary | ICD-10-CM | POA: Diagnosis not present

## 2017-03-08 DIAGNOSIS — Z961 Presence of intraocular lens: Secondary | ICD-10-CM | POA: Diagnosis not present

## 2017-03-11 DIAGNOSIS — Z23 Encounter for immunization: Secondary | ICD-10-CM | POA: Diagnosis not present

## 2017-03-11 DIAGNOSIS — R05 Cough: Secondary | ICD-10-CM | POA: Diagnosis not present

## 2017-03-11 DIAGNOSIS — K219 Gastro-esophageal reflux disease without esophagitis: Secondary | ICD-10-CM | POA: Insufficient documentation

## 2017-03-14 ENCOUNTER — Telehealth: Payer: Self-pay | Admitting: Cardiovascular Disease

## 2017-03-14 NOTE — Telephone Encounter (Signed)
Received notes from [facility] for appointment on 03/23/17 with Dr Claiborne Billings.  Records put with Dr Evette Georges schedule for 03/23/17. lp

## 2017-03-23 ENCOUNTER — Ambulatory Visit (INDEPENDENT_AMBULATORY_CARE_PROVIDER_SITE_OTHER): Payer: Medicare Other | Admitting: Cardiovascular Disease

## 2017-03-23 ENCOUNTER — Encounter: Payer: Self-pay | Admitting: Cardiovascular Disease

## 2017-03-23 VITALS — BP 138/52 | HR 70 | Ht 59.0 in | Wt 159.8 lb

## 2017-03-23 DIAGNOSIS — K219 Gastro-esophageal reflux disease without esophagitis: Secondary | ICD-10-CM | POA: Diagnosis not present

## 2017-03-23 DIAGNOSIS — E7800 Pure hypercholesterolemia, unspecified: Secondary | ICD-10-CM | POA: Diagnosis not present

## 2017-03-23 DIAGNOSIS — R5383 Other fatigue: Secondary | ICD-10-CM | POA: Diagnosis not present

## 2017-03-23 DIAGNOSIS — I10 Essential (primary) hypertension: Secondary | ICD-10-CM | POA: Diagnosis not present

## 2017-03-23 DIAGNOSIS — J302 Other seasonal allergic rhinitis: Secondary | ICD-10-CM | POA: Diagnosis not present

## 2017-03-23 DIAGNOSIS — Z950 Presence of cardiac pacemaker: Secondary | ICD-10-CM | POA: Diagnosis not present

## 2017-03-23 NOTE — Progress Notes (Signed)
ttt

## 2017-03-23 NOTE — Patient Instructions (Signed)

## 2017-03-23 NOTE — Progress Notes (Signed)
Patient ID: [name], female   DOB: 1931-07-01, 83 y.o.   MRN: [id number]    Primary MD: Dr. Legrand Como Altheimer  HPI: [name] is a 83 y.o. female who presents to the office today for a 7 month cardiology evaluation.  [name] underwent dual-chamber pacemaker implantation for complete heart block. She has a history of paroxysmal atrial fibrillation, a history of lower extremity edema as well as GERD. She also has a history of hypertension as well as hyperlipidemia.   A follow-up echo Doppler study on 10/23/2014 revealed an ejection fraction in the 50-55% range with grade 1 diastolic dysfunction and mild mitral regurgitation. Her last pacemaker interrogation was in June 2017 by Dr. Sallyanne Kuster.  At that time, she had normal pacemaker function and had 97% atrial pacing and 100% ventricular pacing.  There was no underlying escape rhythm.  Estimated longevity was 1.75-2.25 years.  She continues to do well and is without chest pain.  She notes occasional fatigue.  Typically she goes to bed at midnight and wakes up at 6 AM.  She experiences a dry cough and is unaware of any postnasal drip.  She has a history of GERD.  She denies recent wheezing. She denies presyncope or syncope. At times she admits to intermittent leg swelling. She has a history of hypothyroidism on thyroid replacement.  She sees Dr. Elyse Hsu for primary and endocrinologic care and recently saw him on 08/03/2016.  He is now part of the [facility].  Her recent lipid study on pravastatin from February 2018 showed a total cholesterol 137, triglycerides 89, HDL 61, and LDL 69.; Hemoglobin A1c was 5.8.  TSH was 3.2.  Vitamin D level was 42.   Since I last saw her. She underwent a dual pacemaker generator changeout on 8/22 after her battery reached ERI. She has had an evaluation for reflux related laryngitis/cough by Dr. Constance Holster.  She had remotely been on allergy shots but stopped in early 2018. She has had some post nasal  dril also contributing to symptoms.  Shr denies chest pain, dyspnea or palpitations. She presents for evaluation.    Past Medical History:  Diagnosis Date  . Allergic rhinitis   . Asthmatic bronchitis   . Chronic cough   . Esophageal reflux   . GERD (gastroesophageal reflux disease)   . History of stress test 11/20/2009   This was essentially normal,post-stress EF was hyperdynamic at 73 beats per mins, There was no scar or ischemia.  Marland Kitchen Hx of echocardiogram 11/05/2010   EF>55% showed a normal systolic function with impaired diastolic dysfuntion and also tissue doppler suggesting increased elevated left atrial pressure, she had moderate LA dilatation and mild LA dilatation. There was calcified mitral apparatus with moderate MR, mild to moderate TR and trace aortic insufficiency.  . Hyperlipemia   . Hypertension   . PAF (paroxysmal atrial fibrillation) ([place])     Past Surgical History:  Procedure Laterality Date  . CHOLECYSTECTOMY    . FOOT SURGERY     right  . NASAL SEPTUM SURGERY    . PACEMAKER INSERTION  June 1st 2009   St Jude [place] device, model #5826, serial #[id number], The artial lead is a Interior and spatial designer, the ventricular lead is Environmental education officer.  Marland Kitchen PPM GENERATOR CHANGEOUT N/A 02/09/2017   Procedure: PPM GENERATOR CHANGEOUT;  Surgeon: Sanda Klein, MD;  Location: [place] [facility];  Service: Cardiovascular;  Laterality: N/A;  . THYROID SURGERY    . TONSILLECTOMY    .  VESICOVAGINAL FISTULA CLOSURE W/ TAH      Allergies  Allergen Reactions  . Fluticasone-Salmeterol Other (See Comments)    unknown  . Sulfonamide Derivatives Other (See Comments)    unknown  . Erythromycin Nausea And Vomiting and Other (See Comments)    Stomach cramps    Current Outpatient Prescriptions  Medication Sig Dispense Refill  . Ascorbic Acid (VITAMIN C PO) Take 2 tablets by mouth daily.    Marland Kitchen aspirin 81 MG tablet Take 81 mg by mouth at bedtime.     Marland Kitchen diltiazem (CARDIZEM CD) 180 MG 24 hr capsule  TAKE 1 CAPSULE BY MOUTH DAILY. KEEP APPT. (Patient taking differently: TAKE 1 CAPSULE BY MOUTH DAILY AT NIGHT) 30 capsule 6  . LORazepam (ATIVAN) 0.5 MG tablet Take 0.5 mg by mouth daily as needed for anxiety.     . metoprolol succinate (TOPROL-XL) 25 MG 24 hr tablet Take 1 tablet (25 mg total) by mouth daily. (Patient taking differently: Take 25 mg by mouth at bedtime. ) 30 tablet 11  . pantoprazole (PROTONIX) 40 MG tablet Take 40 mg by mouth daily before breakfast.     . pravastatin (PRAVACHOL) 40 MG tablet Take 1 tablet (40 mg total) by mouth daily. 90 tablet 3  . Simethicone (GAS-X PO) Take 1 tablet by mouth daily as needed (gas).    . SYNTHROID 25 MCG tablet Take 25 mcg by mouth daily before breakfast.   12  . valsartan-hydrochlorothiazide (DIOVAN-HCT) 160-25 MG tablet TAKE 1 TABLET BY MOUTH DAILY. (Patient taking differently: TAKE 1 TABLET BY MOUTH DAILY AFTER BREAKFAST) 30 tablet 5   No current facility-administered medications for this visit.     Social History   Social History  . Marital status: Married    Spouse name: N/A  . Number of children: N/A  . Years of education: N/A   Occupational History  .  Retired    Retired Sales promotion account executive at [employer]  . Smoking status: Never Smoker  . Smokeless tobacco: Never Used  . Alcohol use No  . Drug use: No  . Sexual activity: Not on file   Other Topics Concern  . Not on file   Social History Narrative  . No narrative on file    Family History  Problem Relation Age of Onset  . Stroke Mother   . Cancer Father        bladder  . Stroke Sister    Additional social history is notable that she is widowed and has one daughter and 2 sons.  ROS General: Negative; No fevers, chills, or night sweats;  HEENT: Negative; No changes in vision or hearing, sinus congestion, difficulty swallowing Pulmonary: positive for mild cough Cardiovascular: Negative; No chest pain, presyncope, syncope,  palpitations.  Positive for leg swelling GI: Positive for GERD, controlled with Nexium; No nausea, vomiting, diarrhea, or abdominal pain GU: Negative; No dysuria, hematuria, or difficulty voiding Musculoskeletal: Negative; no myalgias, joint pain, or weakness Hematologic/Oncology: Negative; no easy bruising, bleeding Endocrine: Negative; no heat/cold intolerance; no diabetes Neuro: Negative; no changes in balance, headaches Skin: Negative; No rashes or skin lesions Psychiatric: Negative; No behavioral problems, depression Sleep: Negative; No snoring, daytime sleepiness, hypersomnolence, bruxism, restless legs, hypnogognic hallucinations, no cataplexy Other comprehensive 14 point system review is negative   PE BP (!) 138/52   Pulse 70   Ht 4' 11"  (1.499 m)   Wt 159 lb 12.8 oz (72.5 kg)   BMI 32.28 kg/m  Repeat by me 128/70   Wt Readings from Last 3 Encounters:  03/23/17 159 lb 12.8 oz (72.5 kg)  02/09/17 164 lb (74.4 kg)  02/04/17 165 lb (74.8 kg)    Physical Exam BP (!) 138/52   Pulse 70   Ht 4' 11"  (1.499 m)   Wt 159 lb 12.8 oz (72.5 kg)   BMI 32.28 kg/m  General: Alert, oriented, no distress.  Skin: normal turgor, no rashes, warm and dry HEENT: Normocephalic, atraumatic. Pupils equal round and reactive to light; sclera anicteric; extraocular muscles intact;  Nose without nasal septal hypertrophy Mouth/Parynx benign; Mallinpatti scale 2 Neck: No JVD, no carotid bruits; normal carotid upstroke Lungs: clear to ausculatation and percussion; no wheezing or rales Chest wall: without tenderness to palpitation Heart: PMI not displaced, RRR, s1 s2 normal, 1/6 systolic murmur, no diastolic murmur, no rubs, gallops, thrills, or heaves Abdomen: mild central adiposity;soft, nontender; no hepatosplenomehaly, BS+; abdominal aorta nontender and not dilated by palpation. Back: no CVA tenderness Pulses 2+ Musculoskeletal: full range of motion, normal strength, no joint  deformities Extremities: no clubbing cyanosis or edema, Homan's sign negative  Neurologic: grossly nonfocal; Cranial nerves grossly wnl Psychologic: Normal mood and affect    ECG (independently read by me): AV paced rhythm with a ventricular rate at 70 bpm.  PR interval 224 ms.  QTc interval 477 ms.  March 2018 ECG (independently read by me): AV paced rhythm at 70 bpm with prolonged AV conduction with a PR interval of 222 ms.  QTc interval 490 ms.  October 2017 ECG (independently read by me): AV paced rhythm with 100% pacing of both chambers.  PR interval 226 ms.    December 2016 ECG (independently read by me):  100% AV pacing with ventricular rate at 70 and prolonged AV conduction at 224 ms.  June 2016ECG (independently read by me): AV sequentially paced rhythm with 100% capture.  Ventricular rate 70 beats per minute.  PR interval 224 ms.  August 2014 ECG: 80 paced rhythm at 70 beats per minute. PR interval 222 ms.  LABS: I personally reviewed blood work done by Dr. Elyse Hsu from 12/03/2015, and 03/08/2016.  Total cholesterol was 155, triglycerides 120, LDL 70, HDL 61.  Creatinine 1.03.  Glucose 111.  Hemoglobin A1c 5.9.  TSH 4.85.  Vitamin D 38.  Free T4 1 0.06.  I also reviewed lab work from February 2018.  Most recent lab from 01/25/2017 was reviewed: TC 148, LDL 81, TG 82, HDL 56  BMP Latest Ref Rng & Units 02/04/2017 10/16/2014 10/15/2014  Glucose 65 - 99 mg/dL 120(H) 120(H) 107(H)  BUN 8 - 27 mg/dL 18 18 20   Creatinine 0.57 - 1.00 mg/dL 1.17(H) 1.14(H) 0.83  BUN/Creat Ratio 12 - 28 15 - -  Sodium 134 - 144 mmol/L 138 136 136  Potassium 3.5 - 5.2 mmol/L 4.1 3.7 3.9  Chloride 96 - 106 mmol/L 99 99 99  CO2 20 - 29 mmol/L 24 28 28   Calcium 8.7 - 10.3 mg/dL 9.0 9.0 8.9   Hepatic Function Latest Ref Rng & Units 10/16/2014 11/28/2007 11/24/2007  Total Protein 6.0 - 8.3 g/dL 6.6 5.9(L) 5.8(L)  Albumin 3.5 - 5.2 g/dL 3.6 2.6(L) 2.8(L)  AST 0 - 37 U/L 24 21 77(H)  ALT 0 - 35 U/L 21 29  85(H)  Alk Phosphatase 39 - 117 U/L 72 61 75  Total Bilirubin 0.3 - 1.2 mg/dL 0.3 0.4 0.4   CBC Latest Ref Rng & Units 02/04/2017 10/16/2014 11/28/2007  WBC 3.4 - 10.8 x10E3/uL 6.0 7.3 8.7  Hemoglobin 11.1 - 15.9 g/dL 11.4 11.0(L) 9.7(L)  Hematocrit 34.0 - 46.6 % 34.4 33.3(L) 28.5(L)  Platelets 150 - 379 x10E3/uL 222 216 183   Lab Results  Component Value Date   MCV 90 02/04/2017   MCV 91.0 10/16/2014   MCV 91.2 11/28/2007   Lipid Panel     Component Value Date/Time   CHOL 136 03/13/2015 1436   TRIG 95 03/13/2015 1436   HDL 57 03/13/2015 1436   CHOLHDL 2.4 03/13/2015 1436   VLDL 19 03/13/2015 1436   LDLCALC 60 03/13/2015 1436  RADIOLOGY: No results found.  IMPRESSION:  1. Essential hypertension   2. Pure hypercholesterolemia   3. Pacemaker   4. Fatigue, unspecified type   5. Gastroesophageal reflux disease without esophagitis   6. Seasonal allergic rhinitis, unspecified trigger     ASSESSMENT AND PLAN: [name] is an 83 year old female who has a history of hypertension, PAF, dual chamber pacemaker implantation for complete heart block, and intermittent leg edema.  With reference to her hypertension, her blood pressure today controlled on valsartan HCT 160/25, Toprol-XL 30m and diltiazem 180 mg.  Her leg edema improved with titration of her HCTZ component to 25 mg.  An echo Doppler study on 10/23/2014 showed an ejection fraction of 50-55% without regional wall motion abnormalities.  There was evidence for grade 1 diastolic dysfunction.  There was mild mitral regurgitation.  Her pacemaker reached ERI on 12/31/16 and she underwent a generator change out by Dr. CRecardo Evangeliston 02/09/17.  She continues to be 100% paced.  She is scheduled for a follow-up pacemaker evaluation on 10/29/2016.  I reviewed the recent blood work from 01/25/2017 done by Dr. AEden Emms  Her lipid studies are good.  She continues to be on low-dose thyroid levothyroxine at 25 g.  There is no wheezing on exam today  and she continues to take DuoNeb as needed. 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  She is on Protonix for GERD.  I have also recommended OTC zyrtec, claritin or allegra for rhinitis due to allergies.   I encouraed increase exercise and walking 5 days a week if possible.     I will see her in 6 months for reevaluation.    Time spent: 25 minutes  TTroy Sine MD, [facility] 03/25/2017 7:10 AM

## 2017-04-05 DIAGNOSIS — Z961 Presence of intraocular lens: Secondary | ICD-10-CM | POA: Diagnosis not present

## 2017-04-05 DIAGNOSIS — H04123 Dry eye syndrome of bilateral lacrimal glands: Secondary | ICD-10-CM | POA: Diagnosis not present

## 2017-04-25 DIAGNOSIS — I10 Essential (primary) hypertension: Secondary | ICD-10-CM | POA: Diagnosis not present

## 2017-04-25 DIAGNOSIS — R7301 Impaired fasting glucose: Secondary | ICD-10-CM | POA: Diagnosis not present

## 2017-04-25 DIAGNOSIS — E7800 Pure hypercholesterolemia, unspecified: Secondary | ICD-10-CM | POA: Diagnosis not present

## 2017-04-25 DIAGNOSIS — E039 Hypothyroidism, unspecified: Secondary | ICD-10-CM | POA: Diagnosis not present

## 2017-05-13 ENCOUNTER — Other Ambulatory Visit: Payer: Self-pay | Admitting: Cardiovascular Disease

## 2017-05-16 NOTE — Telephone Encounter (Signed)
Rx(s) sent to pharmacy electronically.  

## 2017-05-24 DIAGNOSIS — E559 Vitamin D deficiency, unspecified: Secondary | ICD-10-CM | POA: Diagnosis not present

## 2017-05-24 DIAGNOSIS — R7301 Impaired fasting glucose: Secondary | ICD-10-CM | POA: Diagnosis not present

## 2017-05-24 DIAGNOSIS — E7800 Pure hypercholesterolemia, unspecified: Secondary | ICD-10-CM | POA: Diagnosis not present

## 2017-05-24 DIAGNOSIS — I10 Essential (primary) hypertension: Secondary | ICD-10-CM | POA: Diagnosis not present

## 2017-05-24 DIAGNOSIS — R5382 Chronic fatigue, unspecified: Secondary | ICD-10-CM | POA: Diagnosis not present

## 2017-05-24 DIAGNOSIS — E039 Hypothyroidism, unspecified: Secondary | ICD-10-CM | POA: Diagnosis not present

## 2017-05-25 ENCOUNTER — Encounter: Payer: Medicare Other | Admitting: Cardiovascular Disease

## 2017-05-27 ENCOUNTER — Telehealth: Payer: Self-pay | Admitting: Cardiovascular Disease

## 2017-05-27 NOTE — Telephone Encounter (Signed)
Received incoming records from [facility] for upcoming appointment on 06/01/17 @ 9am with Dr. Sallyanne Kuster. Records given to [facility] in Medical Records. 05/27/17 ab

## 2017-06-01 ENCOUNTER — Encounter: Payer: Medicare Other | Admitting: Cardiovascular Disease

## 2017-06-03 ENCOUNTER — Telehealth: Payer: Self-pay | Admitting: Cardiovascular Disease

## 2017-06-09 ENCOUNTER — Telehealth: Payer: Self-pay | Admitting: Family

## 2017-06-09 NOTE — Telephone Encounter (Signed)
Erroneous encounter, please disregard

## 2017-06-15 ENCOUNTER — Encounter: Payer: Self-pay | Admitting: Cardiovascular Disease

## 2017-06-15 ENCOUNTER — Ambulatory Visit (INDEPENDENT_AMBULATORY_CARE_PROVIDER_SITE_OTHER): Payer: Medicare Other | Admitting: Cardiovascular Disease

## 2017-06-15 VITALS — BP 126/62 | HR 70 | Ht 59.0 in | Wt 162.6 lb

## 2017-06-15 DIAGNOSIS — I10 Essential (primary) hypertension: Secondary | ICD-10-CM | POA: Diagnosis not present

## 2017-06-15 DIAGNOSIS — I480 Paroxysmal atrial fibrillation: Secondary | ICD-10-CM

## 2017-06-15 DIAGNOSIS — I471 Supraventricular tachycardia: Secondary | ICD-10-CM | POA: Diagnosis not present

## 2017-06-15 DIAGNOSIS — I442 Atrioventricular block, complete: Secondary | ICD-10-CM | POA: Diagnosis not present

## 2017-06-15 DIAGNOSIS — I495 Sick sinus syndrome: Secondary | ICD-10-CM | POA: Insufficient documentation

## 2017-06-15 DIAGNOSIS — Z950 Presence of cardiac pacemaker: Secondary | ICD-10-CM

## 2017-06-15 DIAGNOSIS — E7800 Pure hypercholesterolemia, unspecified: Secondary | ICD-10-CM

## 2017-06-15 NOTE — Patient Instructions (Signed)

## 2017-06-15 NOTE — Progress Notes (Signed)
Patient ID: [name], female   DOB: 1931-10-25, 83 y.o.   MRN: [id number] Patient ID: [name], female   DOB: [birth year], 83 y.o.   MRN: [id number]    Cardiology Office Note    Date:  06/15/2017   ID:  [name], DOB 1932/05/08, MRN [id number]  PCP:  Lorne Skeens, MD  Cardiologist: Shelva Majestic, M.D.;  Sanda Klein, MD   Chief Complaint  Patient presents with  . Follow-up    fatigue    History of Present Illness:  [name] is a 83 y.o. female who returns in follow-up for sinus node dysfunction, complete heart block, paroxysmal atrial .fibrillation and pacemaker check.    She underwent pacemaker change out for ERI in August 2018. She now has more than 70% atrial pacing, consistent with progressive sinus node dysfunction and negative chronotropic effects from her blood pressure medications. She complains of lack of energy and fatigue. She denies dyspnea or angina. She is not aware of any palpitations and denies focal neurological deficits bleeding problem, lower extremity edema, intermittent claudication, cough hemoptysis or wheezing. She has gained a few pounds.  Pacemaker interrogation shows normal device function. Her device is at beginning of life with an estimated longevity of about 10 years. She has 78% atrial pacing and 100% ventricular pacing. For brief episodes of paroxysmal atrial tachycardia lasting for just a few seconds are recorded. There is no atrial fibrillation.  Underlying rhythm is sinus bradycardia in the 40s with complete heart block. She is pacemaker dependent.  Past Medical History:  Diagnosis Date  . Allergic rhinitis   . Asthmatic bronchitis   . Chronic cough   . Esophageal reflux   . GERD (gastroesophageal reflux disease)   . History of stress test 11/20/2009   This was essentially normal,post-stress EF was hyperdynamic at 73 beats per mins, There was no scar or ischemia.  Marland Kitchen Hx of echocardiogram 11/05/2010   EF>55% showed a normal  systolic function with impaired diastolic dysfuntion and also tissue doppler suggesting increased elevated left atrial pressure, she had moderate LA dilatation and mild LA dilatation. There was calcified mitral apparatus with moderate MR, mild to moderate TR and trace aortic insufficiency.  . Hyperlipemia   . Hypertension   . PAF (paroxysmal atrial fibrillation) ([place])     Past Surgical History:  Procedure Laterality Date  . CHOLECYSTECTOMY    . FOOT SURGERY     right  . NASAL SEPTUM SURGERY    . PACEMAKER INSERTION  June 1st 2009   St Jude Pisek device, model #5826, serial #[id number], The artial lead is a Interior and spatial designer, the ventricular lead is Environmental education officer.  Marland Kitchen PPM GENERATOR CHANGEOUT N/A 02/09/2017   Procedure: PPM GENERATOR CHANGEOUT;  Surgeon: Sanda Klein, MD;  Location: [facility];  Service: Cardiovascular;  Laterality: N/A;  . THYROID SURGERY    . TONSILLECTOMY    . VESICOVAGINAL FISTULA CLOSURE W/ TAH      Current Outpatient Medications  Medication Sig Dispense Refill  . Ascorbic Acid (VITAMIN C PO) Take 2 tablets by mouth daily.    Marland Kitchen aspirin 81 MG tablet Take 81 mg by mouth at bedtime.     Marland Kitchen diltiazem (CARDIZEM CD) 180 MG 24 hr capsule TAKE 1 CAPSULE BY MOUTH DAILY. KEEP APPT. 30 capsule 6  . LORazepam (ATIVAN) 0.5 MG tablet Take 0.5 mg by mouth daily as needed for anxiety.     . metoprolol succinate (TOPROL-XL) 25 MG 24 hr tablet Take 1  tablet (25 mg total) by mouth daily. (Patient taking differently: Take 25 mg by mouth at bedtime. ) 30 tablet 11  . pantoprazole (PROTONIX) 40 MG tablet Take 40 mg by mouth daily before breakfast.     . pravastatin (PRAVACHOL) 40 MG tablet TAKE 1 TABLET (40 MG TOTAL) BY MOUTH DAILY. 90 tablet 3  . Simethicone (GAS-X PO) Take 1 tablet by mouth daily as needed (gas).    . SYNTHROID 25 MCG tablet Take 25 mcg by mouth daily before breakfast.   12  . valsartan-hydrochlorothiazide (DIOVAN-HCT) 160-25 MG tablet TAKE 1 TABLET BY MOUTH DAILY.  (Patient taking differently: TAKE 1 TABLET BY MOUTH DAILY AFTER BREAKFAST) 30 tablet 5   No current facility-administered medications for this visit.     Allergies:   Fluticasone-salmeterol; Sulfonamide derivatives; and Erythromycin   Social History   Socioeconomic History  . Marital status: Married    Spouse name: None  . Number of children: None  . Years of education: None  . Highest education level: None  Social Needs  . Financial resource strain: None  . Food insecurity - worry: None  . Food insecurity - inability: None  . Transportation needs - medical: None  . Transportation needs - non-medical: None  Occupational History    Employer: RETIRED    Comment: Retired Sales promotion account executive at [employer]  . Smoking status: Never Smoker  . Smokeless tobacco: Never Used  Substance and Sexual Activity  . Alcohol use: No  . Drug use: No  . Sexual activity: None  Other Topics Concern  . None  Social History Narrative  . None     Family History:  The patient's family history includes Cancer in her father; Stroke in her mother and sister.   ROS:   Please see the history of present illness.    Review of Systems  Cardiovascular: Positive for dyspnea on exertion.  Musculoskeletal: Positive for arthritis, back pain, muscle cramps and stiffness.   All other systems reviewed and are negative.   PHYSICAL EXAM:   VS:  BP 126/62   Pulse 70   Ht 4\' 11"  (1.499 m)   Wt 162 lb 9.6 oz (73.8 kg)   BMI 32.84 kg/m      General: Alert, oriented x3, no distress, mildly obese Head: no evidence of trauma, PERRL, EOMI, no exophtalmos or lid lag, no myxedema, no xanthelasma; normal ears, nose and oropharynx Neck: normal jugular venous pulsations and no hepatojugular reflux; brisk carotid pulses without delay and no carotid bruits Chest: clear to auscultation, no signs of consolidation by percussion or palpation, normal fremitus, symmetrical and full respiratory  excursions. Well-healed right subclavian pacemaker site Cardiovascular: normal position and quality of the apical impulse, regular rhythm, normal first and paradoxically split second heart sounds, no murmurs, rubs or gallops Abdomen: no tenderness or distention, no masses by palpation, no abnormal pulsatility or arterial bruits, normal bowel sounds, no hepatosplenomegaly Extremities: no clubbing, cyanosis or edema; 2+ radial, ulnar and brachial pulses bilaterally; 2+ right femoral, posterior tibial and dorsalis pedis pulses; 2+ left femoral, posterior tibial and dorsalis pedis pulses; no subclavian or femoral bruits Neurological: grossly nonfocal Psych: Normal mood and affect    Wt Readings from Last 3 Encounters:  06/15/17 162 lb 9.6 oz (73.8 kg)  03/23/17 159 lb 12.8 oz (72.5 kg)  02/09/17 164 lb (74.4 kg)      Studies/Labs Reviewed:   EKG:  EKG is not ordered today.   Recent Labs: 02/04/2017:  BUN 18; Creatinine, Ser 1.17; Hemoglobin 11.4; Platelets 222; Potassium 4.1; Sodium 138   Lipid Panel    Component Value Date/Time   CHOL 136 03/13/2015 1436   TRIG 95 03/13/2015 1436   HDL 57 03/13/2015 1436   CHOLHDL 2.4 03/13/2015 1436   VLDL 19 03/13/2015 1436   LDLCALC 60 03/13/2015 1436    September 2017 Total cholesterol 155, triglycerides 121, HDL 61, LDL 70 May 2018 total cholesterol 151, triglycerides 82, HDL 55, LDL 86  ASSESSMENT:    1. CHB (complete heart block) (HCC)   2. Pacemaker   3. SSS (sick sinus syndrome) ([place])   4. PAT (paroxysmal atrial tachycardia) (HCC)   5. Paroxysmal atrial fibrillation ([place])   6. Essential hypertension   7. Pure hypercholesterolemia      PLAN:  In order of problems listed above:  1. CHB: Pacemaker dependent. No underlying escape rhythm 2. PPM: normal device function. Remote ounces every 3 months and yearly office visit 3. SSS: rate response sensor turned on.  Reevaluate heart rate response in several months. 4. PAT: brief  asymptomatic events 5. AFib: Atrial fibrillation has not been recorded in many years (at least since 2012). She is only receiving aspirin and is not on anticoagulation.  6. HTN: well controlled 7. HLP: all parameters in the desirable range   Medication Adjustments/Labs and Tests Ordered: Current medicines are reviewed at length with the patient today.  Concerns regarding medicines are outlined above.  Medication changes, Labs and Tests ordered today are listed below. There are no Patient Instructions on file for this visit.    Signed, Sanda Klein, MD  06/15/2017 2:08 PM    [facility] [address], [place], [place]  [address] Phone: [phone]; Fax: [phone]

## 2017-06-16 NOTE — Telephone Encounter (Signed)
Close encounter 

## 2017-07-02 ENCOUNTER — Other Ambulatory Visit: Payer: Self-pay | Admitting: Cardiovascular Disease

## 2017-07-12 ENCOUNTER — Other Ambulatory Visit: Payer: Self-pay | Admitting: Cardiovascular Disease

## 2017-07-13 NOTE — Telephone Encounter (Signed)
Rx has been sent to the pharmacy electronically. ° °

## 2017-07-26 DIAGNOSIS — Z961 Presence of intraocular lens: Secondary | ICD-10-CM | POA: Diagnosis not present

## 2017-07-26 DIAGNOSIS — H04123 Dry eye syndrome of bilateral lacrimal glands: Secondary | ICD-10-CM | POA: Diagnosis not present

## 2017-08-01 LAB — CUP PACEART INCLINIC DEVICE CHECK
Implantable Lead Implant Date: 20090605
Implantable Lead Location: 753860
Implantable Pulse Generator Implant Date: 20180822
MDC IDC LEAD IMPLANT DT: 20090601
MDC IDC LEAD LOCATION: 753859
MDC IDC PG SERIAL: 8932617
MDC IDC SESS DTM: 20190211164831

## 2017-08-16 DIAGNOSIS — E7800 Pure hypercholesterolemia, unspecified: Secondary | ICD-10-CM | POA: Diagnosis not present

## 2017-08-16 DIAGNOSIS — I10 Essential (primary) hypertension: Secondary | ICD-10-CM | POA: Diagnosis not present

## 2017-08-16 DIAGNOSIS — E039 Hypothyroidism, unspecified: Secondary | ICD-10-CM | POA: Diagnosis not present

## 2017-08-16 DIAGNOSIS — R7301 Impaired fasting glucose: Secondary | ICD-10-CM | POA: Diagnosis not present

## 2017-08-22 DIAGNOSIS — E7800 Pure hypercholesterolemia, unspecified: Secondary | ICD-10-CM | POA: Diagnosis not present

## 2017-08-22 DIAGNOSIS — R7301 Impaired fasting glucose: Secondary | ICD-10-CM | POA: Diagnosis not present

## 2017-08-22 DIAGNOSIS — I10 Essential (primary) hypertension: Secondary | ICD-10-CM | POA: Diagnosis not present

## 2017-08-22 DIAGNOSIS — Z90710 Acquired absence of both cervix and uterus: Secondary | ICD-10-CM | POA: Diagnosis not present

## 2017-08-22 DIAGNOSIS — E890 Postprocedural hypothyroidism: Secondary | ICD-10-CM | POA: Diagnosis not present

## 2017-08-22 DIAGNOSIS — I447 Left bundle-branch block, unspecified: Secondary | ICD-10-CM | POA: Diagnosis not present

## 2017-08-22 DIAGNOSIS — Z79890 Hormone replacement therapy (postmenopausal): Secondary | ICD-10-CM | POA: Diagnosis not present

## 2017-08-22 DIAGNOSIS — E559 Vitamin D deficiency, unspecified: Secondary | ICD-10-CM | POA: Diagnosis not present

## 2017-08-22 DIAGNOSIS — H9390 Unspecified disorder of ear, unspecified ear: Secondary | ICD-10-CM | POA: Diagnosis not present

## 2017-08-22 DIAGNOSIS — Z950 Presence of cardiac pacemaker: Secondary | ICD-10-CM | POA: Diagnosis not present

## 2017-08-22 DIAGNOSIS — I495 Sick sinus syndrome: Secondary | ICD-10-CM | POA: Diagnosis not present

## 2017-08-22 DIAGNOSIS — R5382 Chronic fatigue, unspecified: Secondary | ICD-10-CM | POA: Diagnosis not present

## 2017-09-14 ENCOUNTER — Telehealth: Payer: Self-pay | Admitting: Cardiology

## 2017-09-14 ENCOUNTER — Ambulatory Visit (INDEPENDENT_AMBULATORY_CARE_PROVIDER_SITE_OTHER): Payer: Medicare Other | Admitting: *Deleted

## 2017-09-14 DIAGNOSIS — I495 Sick sinus syndrome: Secondary | ICD-10-CM | POA: Diagnosis not present

## 2017-09-14 NOTE — Telephone Encounter (Signed)
LMOVM reminding pt to send remote transmission.   

## 2017-09-16 NOTE — Progress Notes (Signed)
Remote pacemaker transmission.   

## 2017-09-19 ENCOUNTER — Encounter: Payer: Self-pay | Admitting: Cardiology

## 2017-09-24 LAB — CUP PACEART REMOTE DEVICE CHECK
Battery Remaining Percentage: 95.5 %
Battery Voltage: 3.01 V
Brady Statistic AP VP Percent: 93 %
Brady Statistic RV Percent Paced: 99 %
Date Time Interrogation Session: 20190327202350
Implantable Lead Implant Date: 20090605
Implantable Lead Location: 753859
Implantable Pulse Generator Implant Date: 20180822
Lead Channel Impedance Value: 580 Ohm
Lead Channel Pacing Threshold Amplitude: 0.625 V
Lead Channel Pacing Threshold Pulse Width: 0.4 ms
Lead Channel Pacing Threshold Pulse Width: 0.4 ms
Lead Channel Sensing Intrinsic Amplitude: 3 mV
Lead Channel Setting Pacing Amplitude: 1.5 V
Lead Channel Setting Pacing Pulse Width: 0.4 ms
Lead Channel Setting Sensing Sensitivity: 4 mV
MDC IDC LEAD IMPLANT DT: 20090601
MDC IDC LEAD LOCATION: 753860
MDC IDC MSMT BATTERY REMAINING LONGEVITY: 116 mo
MDC IDC MSMT LEADCHNL RA PACING THRESHOLD AMPLITUDE: 0.5 V
MDC IDC MSMT LEADCHNL RV IMPEDANCE VALUE: 440 Ohm
MDC IDC SET LEADCHNL RV PACING AMPLITUDE: 0.875
MDC IDC STAT BRADY AP VS PERCENT: 1 %
MDC IDC STAT BRADY AS VP PERCENT: 7 %
MDC IDC STAT BRADY AS VS PERCENT: 1 %
MDC IDC STAT BRADY RA PERCENT PACED: 93 %
Pulse Gen Model: 2272
Pulse Gen Serial Number: 8932617

## 2017-10-04 DIAGNOSIS — Z961 Presence of intraocular lens: Secondary | ICD-10-CM | POA: Diagnosis not present

## 2017-10-04 DIAGNOSIS — H04123 Dry eye syndrome of bilateral lacrimal glands: Secondary | ICD-10-CM | POA: Diagnosis not present

## 2017-10-04 DIAGNOSIS — H04213 Epiphora due to excess lacrimation, bilateral lacrimal glands: Secondary | ICD-10-CM | POA: Diagnosis not present

## 2017-10-04 DIAGNOSIS — D3131 Benign neoplasm of right choroid: Secondary | ICD-10-CM | POA: Diagnosis not present

## 2017-10-04 DIAGNOSIS — H10413 Chronic giant papillary conjunctivitis, bilateral: Secondary | ICD-10-CM | POA: Diagnosis not present

## 2017-11-16 DIAGNOSIS — I10 Essential (primary) hypertension: Secondary | ICD-10-CM | POA: Diagnosis not present

## 2017-11-16 DIAGNOSIS — E559 Vitamin D deficiency, unspecified: Secondary | ICD-10-CM | POA: Diagnosis not present

## 2017-11-16 DIAGNOSIS — R7301 Impaired fasting glucose: Secondary | ICD-10-CM | POA: Diagnosis not present

## 2017-11-16 DIAGNOSIS — E039 Hypothyroidism, unspecified: Secondary | ICD-10-CM | POA: Diagnosis not present

## 2017-11-16 DIAGNOSIS — E7800 Pure hypercholesterolemia, unspecified: Secondary | ICD-10-CM | POA: Diagnosis not present

## 2017-11-21 DIAGNOSIS — E7800 Pure hypercholesterolemia, unspecified: Secondary | ICD-10-CM | POA: Diagnosis not present

## 2017-11-21 DIAGNOSIS — R7301 Impaired fasting glucose: Secondary | ICD-10-CM | POA: Diagnosis not present

## 2017-11-21 DIAGNOSIS — H93A9 Pulsatile tinnitus, unspecified ear: Secondary | ICD-10-CM | POA: Diagnosis not present

## 2017-11-21 DIAGNOSIS — I10 Essential (primary) hypertension: Secondary | ICD-10-CM | POA: Diagnosis not present

## 2017-11-21 DIAGNOSIS — R5382 Chronic fatigue, unspecified: Secondary | ICD-10-CM | POA: Diagnosis not present

## 2017-11-21 DIAGNOSIS — Z79890 Hormone replacement therapy (postmenopausal): Secondary | ICD-10-CM | POA: Diagnosis not present

## 2017-11-21 DIAGNOSIS — Z950 Presence of cardiac pacemaker: Secondary | ICD-10-CM | POA: Diagnosis not present

## 2017-11-21 DIAGNOSIS — E559 Vitamin D deficiency, unspecified: Secondary | ICD-10-CM | POA: Diagnosis not present

## 2017-11-21 DIAGNOSIS — Z7982 Long term (current) use of aspirin: Secondary | ICD-10-CM | POA: Diagnosis not present

## 2017-11-21 DIAGNOSIS — K5790 Diverticulosis of intestine, part unspecified, without perforation or abscess without bleeding: Secondary | ICD-10-CM | POA: Diagnosis not present

## 2017-11-21 DIAGNOSIS — I495 Sick sinus syndrome: Secondary | ICD-10-CM | POA: Diagnosis not present

## 2017-11-21 DIAGNOSIS — E890 Postprocedural hypothyroidism: Secondary | ICD-10-CM | POA: Diagnosis not present

## 2017-12-04 ENCOUNTER — Other Ambulatory Visit: Payer: Self-pay | Admitting: Cardiovascular Disease

## 2017-12-09 ENCOUNTER — Ambulatory Visit (INDEPENDENT_AMBULATORY_CARE_PROVIDER_SITE_OTHER): Payer: Medicare Other | Admitting: Podiatry

## 2017-12-09 ENCOUNTER — Encounter: Payer: Self-pay | Admitting: Podiatry

## 2017-12-09 DIAGNOSIS — Q828 Other specified congenital malformations of skin: Secondary | ICD-10-CM

## 2017-12-09 DIAGNOSIS — M779 Enthesopathy, unspecified: Secondary | ICD-10-CM

## 2017-12-09 DIAGNOSIS — M216X1 Other acquired deformities of right foot: Secondary | ICD-10-CM

## 2017-12-09 NOTE — Progress Notes (Signed)
This patient presents to the office with pain noted on the outside ball of her right foot.  She says there is a callus on the outside of her foot which is severely painful walking and wearing her shoes.  She says pain has been worsening the last few months.  She also relates the formation of multiple callus on both feet but only the callus  on the outside right foot is painful.  She presents to the office for evaluation and treatment.   General Appearance  Alert, conversant and in no acute stress.  Vascular  Dorsalis pedis and posterior tibial  pulses are palpable  bilaterally.  Capillary return is within normal limits  bilaterally. Temperature is within normal limits  bilaterally.  Neurologic  Senn-Weinstein monofilament wire test within normal limits  bilaterally. Muscle power within normal limits bilaterally.  Nails Thick disfigured discolored nails with subungual debris  from hallux to fifth toes bilaterally. No evidence of bacterial infection or drainage bilaterally.  Orthopedic  No limitations of motion of motion feet .  No crepitus or effusions noted.  Palpable pain lateral aspect 5th met head right foot.    Skin  Porokeratosis fith met head laterally.  Redness and swelling noted at the lateral aspect 5th met head. Asymptomatic porokeratosis plantar base 5th metatarsal  B/l  Capsulitis 5th MPJ right foot   Porokeratosis 5th MPJ  Right  IE  Debridement of porokeratosis right foot.  Injection therapy due to capsulitis 5th MPJ right foot.  Injection therapy using 1.0 cc. Of 2% xylocaine( 20 mg.) plus 1 cc. of kenalog-la ( 10 mg) plus 1/2 cc. of dexamethazone phosphate ( 2 mg).  Patient was told to purchase spenco insoles to cushion plantar foot .  RTC 4 months   Gardiner Barefoot DPM

## 2017-12-14 ENCOUNTER — Ambulatory Visit (INDEPENDENT_AMBULATORY_CARE_PROVIDER_SITE_OTHER): Payer: Medicare Other | Admitting: *Deleted

## 2017-12-14 DIAGNOSIS — I495 Sick sinus syndrome: Secondary | ICD-10-CM | POA: Diagnosis not present

## 2017-12-14 NOTE — Progress Notes (Signed)
Remote pacemaker transmission.   

## 2017-12-15 ENCOUNTER — Encounter: Payer: Self-pay | Admitting: Cardiology

## 2017-12-18 LAB — CUP PACEART REMOTE DEVICE CHECK
Battery Remaining Longevity: 112 mo
Brady Statistic AS VS Percent: 1 %
Brady Statistic RA Percent Paced: 94 %
Date Time Interrogation Session: 20190626060015
Implantable Lead Implant Date: 20090601
Implantable Lead Location: 753859
Implantable Lead Location: 753860
Implantable Pulse Generator Implant Date: 20180822
Lead Channel Impedance Value: 450 Ohm
Lead Channel Pacing Threshold Amplitude: 0.75 V
Lead Channel Pacing Threshold Pulse Width: 0.4 ms
Lead Channel Setting Pacing Amplitude: 1 V
Lead Channel Setting Pacing Amplitude: 1.625
Lead Channel Setting Pacing Pulse Width: 0.4 ms
MDC IDC LEAD IMPLANT DT: 20090605
MDC IDC MSMT BATTERY REMAINING PERCENTAGE: 95.5 %
MDC IDC MSMT BATTERY VOLTAGE: 3.01 V
MDC IDC MSMT LEADCHNL RA IMPEDANCE VALUE: 490 Ohm
MDC IDC MSMT LEADCHNL RA PACING THRESHOLD AMPLITUDE: 0.625 V
MDC IDC MSMT LEADCHNL RA PACING THRESHOLD PULSEWIDTH: 0.4 ms
MDC IDC MSMT LEADCHNL RA SENSING INTR AMPL: 3.5 mV
MDC IDC PG SERIAL: 8932617
MDC IDC SET LEADCHNL RV SENSING SENSITIVITY: 4 mV
MDC IDC STAT BRADY AP VP PERCENT: 95 %
MDC IDC STAT BRADY AP VS PERCENT: 1 %
MDC IDC STAT BRADY AS VP PERCENT: 5.5 %
MDC IDC STAT BRADY RV PERCENT PACED: 99 %
Pulse Gen Model: 2272

## 2018-01-16 DIAGNOSIS — J3089 Other allergic rhinitis: Secondary | ICD-10-CM | POA: Diagnosis not present

## 2018-01-16 DIAGNOSIS — J301 Allergic rhinitis due to pollen: Secondary | ICD-10-CM | POA: Diagnosis not present

## 2018-01-16 DIAGNOSIS — R05 Cough: Secondary | ICD-10-CM | POA: Diagnosis not present

## 2018-01-23 DIAGNOSIS — H43813 Vitreous degeneration, bilateral: Secondary | ICD-10-CM | POA: Diagnosis not present

## 2018-01-23 DIAGNOSIS — H353121 Nonexudative age-related macular degeneration, left eye, early dry stage: Secondary | ICD-10-CM | POA: Diagnosis not present

## 2018-01-23 DIAGNOSIS — H35363 Drusen (degenerative) of macula, bilateral: Secondary | ICD-10-CM | POA: Diagnosis not present

## 2018-01-23 DIAGNOSIS — D3131 Benign neoplasm of right choroid: Secondary | ICD-10-CM | POA: Diagnosis not present

## 2018-01-23 DIAGNOSIS — H353111 Nonexudative age-related macular degeneration, right eye, early dry stage: Secondary | ICD-10-CM | POA: Diagnosis not present

## 2018-02-06 ENCOUNTER — Other Ambulatory Visit: Payer: Self-pay | Admitting: Podiatry

## 2018-02-06 ENCOUNTER — Ambulatory Visit (INDEPENDENT_AMBULATORY_CARE_PROVIDER_SITE_OTHER): Payer: Medicare Other

## 2018-02-06 ENCOUNTER — Ambulatory Visit (INDEPENDENT_AMBULATORY_CARE_PROVIDER_SITE_OTHER): Payer: Medicare Other | Admitting: Podiatry

## 2018-02-06 DIAGNOSIS — M779 Enthesopathy, unspecified: Secondary | ICD-10-CM

## 2018-02-06 DIAGNOSIS — M722 Plantar fascial fibromatosis: Secondary | ICD-10-CM

## 2018-02-07 DIAGNOSIS — L820 Inflamed seborrheic keratosis: Secondary | ICD-10-CM | POA: Diagnosis not present

## 2018-02-08 NOTE — Progress Notes (Signed)
   Subjective: 84 year old female presenting today with a chief complaint of right foot pain that began a few weeks ago. She reports a h/o plantar fasciitis but has not been symptomatic for several years. Walking and standing increases the pain. She has not done anything for treatment. Patient is here for further evaluation and treatment.   Past Medical History:  Diagnosis Date  . Allergic rhinitis   . Asthmatic bronchitis   . Chronic cough   . Esophageal reflux   . GERD (gastroesophageal reflux disease)   . History of stress test 11/20/2009   This was essentially normal,post-stress EF was hyperdynamic at 73 beats per mins, There was no scar or ischemia.  Marland Kitchen Hx of echocardiogram 11/05/2010   EF>55% showed a normal systolic function with impaired diastolic dysfuntion and also tissue doppler suggesting increased elevated left atrial pressure, she had moderate LA dilatation and mild LA dilatation. There was calcified mitral apparatus with moderate MR, mild to moderate TR and trace aortic insufficiency.  . Hyperlipemia   . Hypertension   . PAF (paroxysmal atrial fibrillation) (HCC)      Objective: Physical Exam General: The patient is alert and oriented x3 in no acute distress.  Dermatology: Skin is warm, dry and supple bilateral lower extremities. Negative for open lesions or macerations bilateral.   Vascular: Dorsalis Pedis and Posterior Tibial pulses palpable bilateral.  Capillary fill time is immediate to all digits.  Neurological: Epicritic and protective threshold intact bilateral.   Musculoskeletal: Tenderness to palpation to the plantar aspect of the right heel along the plantar fascia. All other joints range of motion within normal limits bilateral. Strength 5/5 in all groups bilateral.   Radiographic exam: Normal osseous mineralization. Joint spaces preserved. No fracture/dislocation/boney destruction. No other soft tissue abnormalities or radiopaque foreign bodies.    Assessment: 1. Plantar fasciitis right 2. Pain in right foot  Plan of Care:  1. Patient evaluated. Xrays reviewed.   2. Injection of 0.5cc Celestone soluspan injected into the right plantar fascia  3. Recommended OTC Motrin 400 mg twice daily.  4. Return to clinic in 4 weeks.   Edrick Kins, DPM [facility]  Dr. Edrick Kins, DPM    [address], [place] [address]                Office [phone]  Fax [phone]

## 2018-02-13 DIAGNOSIS — R300 Dysuria: Secondary | ICD-10-CM | POA: Diagnosis not present

## 2018-02-13 DIAGNOSIS — E039 Hypothyroidism, unspecified: Secondary | ICD-10-CM | POA: Diagnosis not present

## 2018-02-13 DIAGNOSIS — E7800 Pure hypercholesterolemia, unspecified: Secondary | ICD-10-CM | POA: Diagnosis not present

## 2018-02-13 DIAGNOSIS — R7301 Impaired fasting glucose: Secondary | ICD-10-CM | POA: Diagnosis not present

## 2018-02-13 DIAGNOSIS — I10 Essential (primary) hypertension: Secondary | ICD-10-CM | POA: Diagnosis not present

## 2018-02-21 DIAGNOSIS — E890 Postprocedural hypothyroidism: Secondary | ICD-10-CM | POA: Diagnosis not present

## 2018-02-21 DIAGNOSIS — R5382 Chronic fatigue, unspecified: Secondary | ICD-10-CM | POA: Diagnosis not present

## 2018-02-21 DIAGNOSIS — I10 Essential (primary) hypertension: Secondary | ICD-10-CM | POA: Diagnosis not present

## 2018-02-21 DIAGNOSIS — E559 Vitamin D deficiency, unspecified: Secondary | ICD-10-CM | POA: Diagnosis not present

## 2018-02-21 DIAGNOSIS — M8588 Other specified disorders of bone density and structure, other site: Secondary | ICD-10-CM | POA: Diagnosis not present

## 2018-02-21 DIAGNOSIS — R7301 Impaired fasting glucose: Secondary | ICD-10-CM | POA: Diagnosis not present

## 2018-02-21 DIAGNOSIS — E7800 Pure hypercholesterolemia, unspecified: Secondary | ICD-10-CM | POA: Diagnosis not present

## 2018-03-01 ENCOUNTER — Telehealth: Payer: Self-pay | Admitting: Cardiology

## 2018-03-01 NOTE — Telephone Encounter (Signed)
I agree that it is unlikely a pacemaker or heart issue. MCr

## 2018-03-01 NOTE — Telephone Encounter (Signed)
Patient called and stated that sometimes she feels jerking in her chest. It happens mostly at night. She stated that this has been going on for a long time. She stated that every now and again she will feel a pain in her chest. The pain is not at the incision site. Call routed to [place].

## 2018-03-01 NOTE — Telephone Encounter (Signed)
[name] reports a jerking in her chest that happens once and then subsides for a while. It happens day and night but she notices it more at night. She denies pain with jerking. I advised that it doesn't seem related to her pacemaker (Ap 94%, Vp 99%) if this only happens occasionally but has been happening "for a [name] time". She does have acid reflux.  She goes on to say that she gets up in the morning around 6am and "goes like crazy" until about 10am and she is worn out. She doesn't know if this is related to old age or if this is something else. This has been going on about 3 months. Due to see MCr in December. I let her know that I would make Dr. Sallyanne Kuster aware and call her back with any recommendations.

## 2018-03-06 ENCOUNTER — Ambulatory Visit: Payer: Medicare Other | Admitting: Podiatry

## 2018-03-14 DIAGNOSIS — Z23 Encounter for immunization: Secondary | ICD-10-CM | POA: Diagnosis not present

## 2018-03-15 ENCOUNTER — Ambulatory Visit (INDEPENDENT_AMBULATORY_CARE_PROVIDER_SITE_OTHER): Payer: Medicare Other | Admitting: *Deleted

## 2018-03-15 DIAGNOSIS — I495 Sick sinus syndrome: Secondary | ICD-10-CM

## 2018-03-15 NOTE — Progress Notes (Signed)
Remote pacemaker transmission.   

## 2018-03-16 ENCOUNTER — Encounter: Payer: Self-pay | Admitting: Cardiology

## 2018-03-24 DIAGNOSIS — Z23 Encounter for immunization: Secondary | ICD-10-CM | POA: Diagnosis not present

## 2018-04-03 ENCOUNTER — Ambulatory Visit: Payer: Medicare Other | Admitting: Podiatry

## 2018-04-04 ENCOUNTER — Other Ambulatory Visit: Payer: Self-pay | Admitting: Cardiovascular Disease

## 2018-04-11 ENCOUNTER — Ambulatory Visit: Payer: Medicare Other | Admitting: Podiatry

## 2018-04-14 LAB — CUP PACEART REMOTE DEVICE CHECK
Battery Remaining Longevity: 119 mo
Battery Voltage: 3.01 V
Brady Statistic AP VS Percent: 1 %
Brady Statistic AS VS Percent: 1 %
Date Time Interrogation Session: 20190925060013
Implantable Lead Implant Date: 20090601
Implantable Lead Implant Date: 20090605
Implantable Lead Location: 753859
Implantable Pulse Generator Implant Date: 20180822
Lead Channel Impedance Value: 550 Ohm
Lead Channel Pacing Threshold Pulse Width: 0.4 ms
Lead Channel Sensing Intrinsic Amplitude: 2.7 mV
Lead Channel Setting Pacing Amplitude: 1.125
Lead Channel Setting Pacing Amplitude: 1.625
Lead Channel Setting Pacing Pulse Width: 0.4 ms
MDC IDC LEAD LOCATION: 753860
MDC IDC MSMT BATTERY REMAINING PERCENTAGE: 95.5 %
MDC IDC MSMT LEADCHNL RA PACING THRESHOLD AMPLITUDE: 0.625 V
MDC IDC MSMT LEADCHNL RA PACING THRESHOLD PULSEWIDTH: 0.4 ms
MDC IDC MSMT LEADCHNL RV IMPEDANCE VALUE: 430 Ohm
MDC IDC MSMT LEADCHNL RV PACING THRESHOLD AMPLITUDE: 0.875 V
MDC IDC SET LEADCHNL RV SENSING SENSITIVITY: 4 mV
MDC IDC STAT BRADY AP VP PERCENT: 95 %
MDC IDC STAT BRADY AS VP PERCENT: 4.5 %
MDC IDC STAT BRADY RA PERCENT PACED: 95 %
MDC IDC STAT BRADY RV PERCENT PACED: 99 %
Pulse Gen Model: 2272
Pulse Gen Serial Number: 8932617

## 2018-05-22 ENCOUNTER — Telehealth: Payer: Self-pay | Admitting: Cardiovascular Disease

## 2018-05-22 ENCOUNTER — Other Ambulatory Visit (HOSPITAL_COMMUNITY): Payer: Self-pay | Admitting: Endocrinology

## 2018-05-22 DIAGNOSIS — E7800 Pure hypercholesterolemia, unspecified: Secondary | ICD-10-CM | POA: Diagnosis not present

## 2018-05-22 DIAGNOSIS — R5382 Chronic fatigue, unspecified: Secondary | ICD-10-CM | POA: Diagnosis not present

## 2018-05-22 DIAGNOSIS — R600 Localized edema: Secondary | ICD-10-CM | POA: Diagnosis not present

## 2018-05-22 DIAGNOSIS — R7301 Impaired fasting glucose: Secondary | ICD-10-CM | POA: Diagnosis not present

## 2018-05-22 DIAGNOSIS — E039 Hypothyroidism, unspecified: Secondary | ICD-10-CM | POA: Diagnosis not present

## 2018-05-22 DIAGNOSIS — E559 Vitamin D deficiency, unspecified: Secondary | ICD-10-CM | POA: Diagnosis not present

## 2018-05-22 DIAGNOSIS — I10 Essential (primary) hypertension: Secondary | ICD-10-CM | POA: Diagnosis not present

## 2018-05-22 NOTE — Telephone Encounter (Signed)
Called patient regarding her swelling, patient states that since Thursday she has had swelling in her right foot, and toes, it is red on top, and is painful to touch and place weight. Patient states no SOB, no CP or Chest discomfort. She has not had any diet changes, but does mention she has been elevating her leg since the swelling began. I notified patient that we had appointments here tomorrow, but patient did not want to wait that long. I did advise that I could speak with DOD Dr.Jordan, he advised that patient should see PCP as it could be gout flare up or skin infection. Patient verbalized understanding, she was upset that nobody was available today, I notified her that this was the protocol and that we did have spots available tomorrow if patient seen PCP and they felt she needed to be seen. Patient verbalized understanding and was thankful for the call.

## 2018-05-22 NOTE — Telephone Encounter (Signed)
° ° ° °  Pt c/o swelling: STAT is pt has developed SOB within 24 hours  1) How much weight have you gained and in what time span? n/a  2) If swelling, where is the swelling located? Leg, ankle, foot  3) Are you currently taking a fluid pill? no  4) Are you currently SOB? no  5) Do you have a log of your daily weights (if so, list)? 160, 158, 156  6) Have you gained 3 pounds in a day or 5 pounds in a week? n/a  7) Have you traveled recently? no

## 2018-05-23 ENCOUNTER — Ambulatory Visit (HOSPITAL_COMMUNITY)
Admission: RE | Admit: 2018-05-23 | Discharge: 2018-05-23 | Disposition: A | Discharge status: Home / Self Care | Payer: Medicare Other | Source: Ambulatory Visit | Attending: Family | Admitting: Family

## 2018-05-23 DIAGNOSIS — R600 Localized edema: Secondary | ICD-10-CM | POA: Insufficient documentation

## 2018-05-23 NOTE — Progress Notes (Signed)
Preliminary results (Negative for DVT in right leg) was called to the ordering MD's office.

## 2018-05-24 ENCOUNTER — Ambulatory Visit: Payer: Medicare Other | Admitting: Cardiovascular Disease

## 2018-05-25 ENCOUNTER — Ambulatory Visit (INDEPENDENT_AMBULATORY_CARE_PROVIDER_SITE_OTHER): Payer: Medicare Other

## 2018-05-25 ENCOUNTER — Other Ambulatory Visit: Payer: Self-pay | Admitting: Podiatry

## 2018-05-25 ENCOUNTER — Encounter: Payer: Self-pay | Admitting: Podiatry

## 2018-05-25 ENCOUNTER — Ambulatory Visit (INDEPENDENT_AMBULATORY_CARE_PROVIDER_SITE_OTHER): Payer: Medicare Other | Admitting: Podiatry

## 2018-05-25 DIAGNOSIS — M216X1 Other acquired deformities of right foot: Secondary | ICD-10-CM | POA: Diagnosis not present

## 2018-05-25 DIAGNOSIS — S92321A Displaced fracture of second metatarsal bone, right foot, initial encounter for closed fracture: Secondary | ICD-10-CM

## 2018-05-25 DIAGNOSIS — R600 Localized edema: Secondary | ICD-10-CM | POA: Diagnosis not present

## 2018-05-25 NOTE — Progress Notes (Signed)
Subjective:   Patient ID: [name], female   DOB: 84 y.o.   MRN: [id number]   HPI Patient presents stating that she has developed a lot of pain on top of her right foot for 1 week and she was standing quite a bit before it occurred and she did have a vein test which was negative.  States that it is been very sore and hard to walk on   ROS      Objective:  Physical Exam  Neurovascular status intact with patient found to have significant forefoot edema right extending into the ankle with negative Homans sign.  There is exquisite tenderness around the second metatarsal shaft right that is localized and edema that extends quite a bit from this area     Assessment:  Probability for fracture of the second metatarsal bone right foot     Plan:  H&P condition reviewed and recommended immobilization due to fracture along with compression to try to reduce the swelling.  Due to the advanced swelling I did apply the Unna boot Ace wrap and then applied surgical shoe and advised it may require full immobilization depending on response.  Reappoint to recheck again in 2 weeks  X-ray indicates fracture of the second metatarsal midshaft that does not appear to be displaced

## 2018-05-29 ENCOUNTER — Telehealth: Payer: Self-pay | Admitting: Podiatry

## 2018-05-29 NOTE — Telephone Encounter (Signed)
Unable to speak with pt the line was busy.

## 2018-05-29 NOTE — Telephone Encounter (Signed)
Pt was seen last week and has some questions regarding foot/pain. Please give pt a call.

## 2018-05-29 NOTE — Telephone Encounter (Signed)
Unable to leave a message the phone rang for over 1 minute without answering service.

## 2018-05-30 NOTE — Telephone Encounter (Signed)
I called pt and she asked if she was to take the hard part off that Dr. Paulla Dolly put on. Pt states she was to take the soft boot off Sunday and she and her son did that, is she to wear the ace and the shoe. I told pt that she should continue in the shoe. Pt states she thinks it is some better but is still swollen, especially the toes. I asked pt if she thought she was getting good support and some relief in the soft cast and she stated she was. I asked pt if she would like to come in tomorrow and have the nurse put on another unna boot, pt stated she would. I transferred to schedulers.

## 2018-05-31 ENCOUNTER — Other Ambulatory Visit: Payer: Self-pay | Admitting: Cardiovascular Disease

## 2018-05-31 ENCOUNTER — Ambulatory Visit (INDEPENDENT_AMBULATORY_CARE_PROVIDER_SITE_OTHER): Payer: Medicare Other | Admitting: Podiatry

## 2018-05-31 DIAGNOSIS — M216X1 Other acquired deformities of right foot: Secondary | ICD-10-CM | POA: Diagnosis not present

## 2018-06-01 NOTE — Progress Notes (Signed)
Patient is here today for application of Unna boot.  She states that her foot is still sore and swells at times.  Most recent office visit was 05/25/2018, where she was diagnosed with a fracture in her right foot.  Minimal swelling to right foot, Unna boot was applied with instructions to take it off in 3 days.  She is to wear Ace wrap or compression anklet for support, and remain in her surgical shoe.  Follow-up at her next scheduled appointment or sooner if symptoms get worse.

## 2018-06-08 ENCOUNTER — Ambulatory Visit (INDEPENDENT_AMBULATORY_CARE_PROVIDER_SITE_OTHER): Payer: Medicare Other

## 2018-06-08 ENCOUNTER — Other Ambulatory Visit: Payer: Self-pay | Admitting: Podiatry

## 2018-06-08 ENCOUNTER — Ambulatory Visit: Payer: Medicare Other | Admitting: Podiatry

## 2018-06-08 ENCOUNTER — Ambulatory Visit (INDEPENDENT_AMBULATORY_CARE_PROVIDER_SITE_OTHER): Payer: Medicare Other | Admitting: Podiatry

## 2018-06-08 ENCOUNTER — Encounter: Payer: Self-pay | Admitting: Podiatry

## 2018-06-08 DIAGNOSIS — M79671 Pain in right foot: Secondary | ICD-10-CM

## 2018-06-08 DIAGNOSIS — S92321A Displaced fracture of second metatarsal bone, right foot, initial encounter for closed fracture: Secondary | ICD-10-CM

## 2018-06-08 DIAGNOSIS — S92351D Displaced fracture of fifth metatarsal bone, right foot, subsequent encounter for fracture with routine healing: Secondary | ICD-10-CM | POA: Diagnosis not present

## 2018-06-09 NOTE — Progress Notes (Signed)
Subjective:   Patient ID: [name], female   DOB: 84 y.o.   MRN: [id number]   HPI Patient states that she is gradually improving but she still has a lot of pain in her forefoot and at times she wakes up with throbbing pain at nighttime   ROS      Objective:  Physical Exam  Neurovascular status intact negative Homans sign noted range of motion adequate with exquisite discomfort in the right forefoot centered around the second and third metatarsal shafts with inflammation fluid noted within this area     Assessment:  Significant fracture of the second metatarsal right that is gradually healing but has been a significant injury that will take time to get completely better     Plan:  H&P x-ray reviewed and today I went ahead and advised on the continuation of immobilization treatment and ice therapy.  Gave instructions on gradual increase in activity and that this will heal but it can take longer to do and we will see this patient back again in 6 weeks  X-rays indicate there is fracture of the second metatarsal right that is quite significant and is gradually healing but still has a ways to go over complete consolidation

## 2018-06-12 ENCOUNTER — Ambulatory Visit: Payer: Medicare Other | Admitting: Podiatry

## 2018-06-15 ENCOUNTER — Ambulatory Visit (INDEPENDENT_AMBULATORY_CARE_PROVIDER_SITE_OTHER): Payer: Medicare Other

## 2018-06-15 DIAGNOSIS — I442 Atrioventricular block, complete: Secondary | ICD-10-CM

## 2018-06-15 DIAGNOSIS — I495 Sick sinus syndrome: Secondary | ICD-10-CM

## 2018-06-15 NOTE — Progress Notes (Signed)
Remote pacemaker transmission.   

## 2018-06-18 LAB — CUP PACEART REMOTE DEVICE CHECK
Battery Remaining Longevity: 120 mo
Battery Voltage: 3.01 V
Brady Statistic AS VP Percent: 4.1 %
Brady Statistic RA Percent Paced: 96 %
Brady Statistic RV Percent Paced: 99 %
Date Time Interrogation Session: 20191225070014
Implantable Lead Implant Date: 20090601
Implantable Lead Location: 753859
Implantable Pulse Generator Implant Date: 20180822
Lead Channel Impedance Value: 440 Ohm
Lead Channel Pacing Threshold Amplitude: 0.5 V
Lead Channel Pacing Threshold Pulse Width: 0.4 ms
MDC IDC LEAD IMPLANT DT: 20090605
MDC IDC LEAD LOCATION: 753860
MDC IDC MSMT BATTERY REMAINING PERCENTAGE: 95.5 %
MDC IDC MSMT LEADCHNL RA IMPEDANCE VALUE: 580 Ohm
MDC IDC MSMT LEADCHNL RA SENSING INTR AMPL: 3.1 mV
MDC IDC MSMT LEADCHNL RV PACING THRESHOLD AMPLITUDE: 0.875 V
MDC IDC MSMT LEADCHNL RV PACING THRESHOLD PULSEWIDTH: 0.4 ms
MDC IDC SET LEADCHNL RA PACING AMPLITUDE: 1.5 V
MDC IDC SET LEADCHNL RV PACING AMPLITUDE: 1.125
MDC IDC SET LEADCHNL RV PACING PULSEWIDTH: 0.4 ms
MDC IDC SET LEADCHNL RV SENSING SENSITIVITY: 4 mV
MDC IDC STAT BRADY AP VP PERCENT: 96 %
MDC IDC STAT BRADY AP VS PERCENT: 1 %
MDC IDC STAT BRADY AS VS PERCENT: 1 %
Pulse Gen Model: 2272
Pulse Gen Serial Number: 8932617

## 2018-06-23 ENCOUNTER — Ambulatory Visit (INDEPENDENT_AMBULATORY_CARE_PROVIDER_SITE_OTHER): Payer: Medicare Other | Admitting: Cardiovascular Disease

## 2018-06-23 ENCOUNTER — Encounter: Payer: Self-pay | Admitting: Cardiovascular Disease

## 2018-06-23 VITALS — BP 126/52 | HR 70 | Ht 59.0 in | Wt 158.0 lb

## 2018-06-23 DIAGNOSIS — I495 Sick sinus syndrome: Secondary | ICD-10-CM | POA: Diagnosis not present

## 2018-06-23 DIAGNOSIS — I10 Essential (primary) hypertension: Secondary | ICD-10-CM

## 2018-06-23 DIAGNOSIS — Z950 Presence of cardiac pacemaker: Secondary | ICD-10-CM

## 2018-06-23 DIAGNOSIS — I471 Supraventricular tachycardia: Secondary | ICD-10-CM

## 2018-06-23 DIAGNOSIS — I442 Atrioventricular block, complete: Secondary | ICD-10-CM | POA: Diagnosis not present

## 2018-06-23 DIAGNOSIS — Z8679 Personal history of other diseases of the circulatory system: Secondary | ICD-10-CM

## 2018-06-23 DIAGNOSIS — E7800 Pure hypercholesterolemia, unspecified: Secondary | ICD-10-CM

## 2018-06-23 NOTE — Patient Instructions (Signed)
Medication Instructions:  Dr Sallyanne Kuster recommends that you continue on your current medications as directed. Please refer to the Current Medication list given to you today.  If you need a refill on your cardiac medications before your next appointment, please call your pharmacy.   Follow-Up: At [facility], you and your health needs are our priority.  As part of our continuing mission to provide you with exceptional heart care, we have created designated Provider Care Teams.  These Care Teams include your primary Cardiologist (physician) and Advanced Practice Providers (APPs -  Physician Assistants and Nurse Practitioners) who all work together to provide you with the care you need, when you need it. You will need a follow up appointment in 6 months with a pacemaker check. You may see Sanda Klein, MD or one of the following Advanced Practice Providers on your designated Care Team: [place], [place] . Fabian Sharp, PA-C . You will receive a reminder letter in the mail two months in advance. If you don't receive a letter, please call our office to schedule the follow-up appointment.

## 2018-06-23 NOTE — Progress Notes (Signed)
Patient ID: [name], female   DOB: 1931/07/10, 85 y.o.   MRN: [id number] Patient ID: [name], female   DOB: [birth year], 85 y.o.   MRN: [id number]    Cardiology Office Note    Date:  06/23/2018   ID:  [name], DOB [birth year], MRN [id number]  PCP:  Lorne Skeens, MD  Cardiologist: Shelva Majestic, M.D.;  Sanda Klein, MD   Chief Complaint  Patient presents with  . Follow-up    12 months.  . Chest Pain    At night at times.  . Shortness of Breath    History of Present Illness:  [name] is a 85 y.o. female who returns in follow-up for sinus node dysfunction, complete heart block, paroxysmal atrial .fibrillation and pacemaker check.    Her initial pacemaker was implanted for complete heart block in 2009.  She underwent pacemaker change out for ERI in August 2018.  Rate response sensor was turned on at her last appointment when she had developed about 70% atrial pacing.  On current device interrogation she has 96% atrial pacing.  Although the heart rate histogram is a little more developed, it is still relatively blunted considering that she is active roughly 5 hours a day.  Continues to complain of some degree of fatigue although she denies dyspnea, dizziness, syncope or palpitations.  She occasionally wakes up at night with some chest discomfort.  It does not occur during the daytime with physical activity.  Has not had any problems with lower extremity edema, wheezing, exertional dyspnea, orthopnea, PND, leg edema, claudication.  Son reports that she promptly falls asleep when she sits down to watch a television show, but she sleeps well at night, wakes up feeling refreshed, does not have snoring or witnessed apnea.  She had a normal sleep study a few years ago.  Pacemaker interrogation shows normal device function. Her device is at beginning of life with an estimated longevity of about 10 years. She has 96% atrial pacing and 100% ventricular pacing. A handful of brief  episodes of paroxysmal atrial tachycardia lasting for just a few seconds are again recorded. There is no atrial fibrillation.  Underlying rhythm is sinus bradycardia in the 40s with complete heart block. She is pacemaker dependent.  Past Medical History:  Diagnosis Date  . Allergic rhinitis   . Asthmatic bronchitis   . Chronic cough   . Esophageal reflux   . GERD (gastroesophageal reflux disease)   . History of stress test 11/20/2009   This was essentially normal,post-stress EF was hyperdynamic at 73 beats per mins, There was no scar or ischemia.  Marland Kitchen Hx of echocardiogram 11/05/2010   EF>55% showed a normal systolic function with impaired diastolic dysfuntion and also tissue doppler suggesting increased elevated left atrial pressure, she had moderate LA dilatation and mild LA dilatation. There was calcified mitral apparatus with moderate MR, mild to moderate TR and trace aortic insufficiency.  . Hyperlipemia   . Hypertension   . PAF (paroxysmal atrial fibrillation) ([place])     Past Surgical History:  Procedure Laterality Date  . CHOLECYSTECTOMY    . FOOT SURGERY     right  . NASAL SEPTUM SURGERY    . PACEMAKER INSERTION  June 1st 2009   St Jude [place] device, model #5826, serial #[id number], The artial lead is a Interior and spatial designer, the ventricular lead is Environmental education officer.  Marland Kitchen PPM GENERATOR CHANGEOUT N/A 02/09/2017   Procedure: PPM GENERATOR CHANGEOUT;  Surgeon: Sanda Klein, MD;  Location: [facility]  INVASIVE CV LAB;  Service: Cardiovascular;  Laterality: N/A;  . THYROID SURGERY    . TONSILLECTOMY    . VESICOVAGINAL FISTULA CLOSURE W/ TAH      Current Outpatient Medications  Medication Sig Dispense Refill  . Ascorbic Acid (VITAMIN C PO) Take 2 tablets by mouth daily.    Marland Kitchen aspirin 81 MG tablet Take 81 mg by mouth at bedtime.     . Cholecalciferol (VITAMIN D-1000 MAX ST) 25 MCG (1000 UT) tablet Take 1 tablet once a day    . cromolyn (OPTICROM) 4 % ophthalmic solution INSTILL 1 DROP INTO BOTH EYES 4  TIMES A DAY    . diltiazem (CARDIZEM CD) 180 MG 24 hr capsule TAKE 1 CAPSULE BY MOUTH DAILY. KEEP APPT. 30 capsule 3  . LORazepam (ATIVAN) 0.5 MG tablet Take 0.5 mg by mouth daily as needed for anxiety.     . metoprolol succinate (TOPROL-XL) 25 MG 24 hr tablet TAKE 1 TABLET (25 MG TOTAL) BY MOUTH DAILY. 30 tablet 11  . pravastatin (PRAVACHOL) 40 MG tablet TAKE 1 TABLET BY MOUTH EVERY DAY 30 tablet 0  . Simethicone (GAS-X PO) Take 1 tablet by mouth daily as needed (gas).    . SYNTHROID 25 MCG tablet Take 25 mcg by mouth daily before breakfast.   12  . valsartan-hydrochlorothiazide (DIOVAN-HCT) 160-25 MG tablet TAKE 1 TABLET BY MOUTH EVERY DAY 30 tablet 11   No current facility-administered medications for this visit.     Allergies:   Fluticasone-salmeterol; Sulfonamide derivatives; Benzonatate; Erythromycin; Meloxicam; and Sulfamethoxazole   Social History   Socioeconomic History  . Marital status: Married    Spouse name: Not on file  . Number of children: Not on file  . Years of education: Not on file  . Highest education level: Not on file  Occupational History    Employer: RETIRED    Comment: Retired Sales promotion account executive at [employer]  . Financial resource strain: Not on file  . Food insecurity:    Worry: Not on file    Inability: Not on file  . Transportation needs:    Medical: Not on file    Non-medical: Not on file  Tobacco Use  . Smoking status: Never Smoker  . Smokeless tobacco: Never Used  Substance and Sexual Activity  . Alcohol use: No  . Drug use: No  . Sexual activity: Not on file  Lifestyle  . Physical activity:    Days per week: Not on file    Minutes per session: Not on file  . Stress: Not on file  Relationships  . Social connections:    Talks on phone: Not on file    Gets together: Not on file    Attends religious service: Not on file    Active member of club or organization: Not on file    Attends meetings of clubs or  organizations: Not on file    Relationship status: Not on file  Other Topics Concern  . Not on file  Social History Narrative  . Not on file     Family History:  The patient's family history includes Cancer in her father; Stroke in her mother and sister.   ROS:   Please see the history of present illness.    Review of Systems  Cardiovascular: Positive for dyspnea on exertion.  Musculoskeletal: Positive for arthritis, back pain, muscle cramps and stiffness.   All other systems reviewed and are negative.   PHYSICAL EXAM:   VS:  BP (!) 126/52 (BP Location: Left Arm, Patient Position: Sitting, Cuff Size: Normal)   Pulse 70   Ht 4\' 11"  (1.499 m)   Wt 158 lb (71.7 kg)   BMI 31.91 kg/m       General: Alert, oriented x3, no distress, well-healed right subclavian pacemaker site.  Mildly obese Head: no evidence of trauma, PERRL, EOMI, no exophtalmos or lid lag, no myxedema, no xanthelasma; normal ears, nose and oropharynx Neck: normal jugular venous pulsations and no hepatojugular reflux; brisk carotid pulses without delay and no carotid bruits Chest: clear to auscultation, no signs of consolidation by percussion or palpation, normal fremitus, symmetrical and full respiratory excursions Cardiovascular: normal position and quality of the apical impulse, regular rhythm, normal first and toxically split second heart sounds, no murmurs, rubs or gallops Abdomen: no tenderness or distention, no masses by palpation, no abnormal pulsatility or arterial bruits, normal bowel sounds, no hepatosplenomegaly Extremities: no clubbing, cyanosis or edema; 2+ radial, ulnar and brachial pulses bilaterally; 2+ right femoral, posterior tibial and dorsalis pedis pulses; 2+ left femoral, posterior tibial and dorsalis pedis pulses; no subclavian or femoral bruits Neurological: grossly nonfocal Psych: Normal mood and affect     Wt Readings from Last 3 Encounters:  06/23/18 158 lb (71.7 kg)  06/15/17 162 lb  9.6 oz (73.8 kg)  03/23/17 159 lb 12.8 oz (72.5 kg)      Studies/Labs Reviewed:   EKG:  EKG is ordered today.  It shows 100% atrial ventricular sequential pacing. Recent Labs: No results found for requested labs within last 8760 hours.   Lipid Panel    Component Value Date/Time   CHOL 136 03/13/2015 1436   TRIG 95 03/13/2015 1436   HDL 57 03/13/2015 1436   CHOLHDL 2.4 03/13/2015 1436   VLDL 19 03/13/2015 1436   LDLCALC 60 03/13/2015 1436    September 2017 Total cholesterol 155, triglycerides 121, HDL 61, LDL 70 May 2018 total cholesterol 151, triglycerides 82, HDL 55, LDL 86  ASSESSMENT:    1. Complete heart block ([place])   2. Pacemaker   3. Sick sinus syndrome ([facility])   4. Paroxysmal atrial tachycardia ([place])   5. History of atrial fibrillation   6. Essential hypertension   7. Pure hypercholesterolemia      PLAN:  In order of problems listed above:  1. CHB: Pacemaker dependent. No underlying escape rhythm 2. PPM: normal device function. Download in 3 months. 3. SSS: Has suggestion of chronotropic incompetence.  Rate response sensor adjusted to fast reaction time and a slope of 9.  Evaluate for further adjustment in about 6 months.   4. PAT: brief asymptomatic events.  On beta-blocker and a low-dose. 5. AFib: Atrial fibrillation has not been recorded in many years (at least since 2012). She is only receiving aspirin and is not on anticoagulation.  6. HTN: Control is good 7. HLP: Monitored by PCP, all parameters are in desirable range.  Good LDL less than 100 in the absence of known vascular disease.   Medication Adjustments/Labs and Tests Ordered: Current medicines are reviewed at length with the patient today.  Concerns regarding medicines are outlined above.  Medication changes, Labs and Tests ordered today are listed below. Patient Instructions  Medication Instructions:  Dr Sallyanne Kuster recommends that you continue on your current medications as directed. Please refer  to the Current Medication list given to you today.  If you need a refill on your cardiac medications before your next appointment, please call your pharmacy.  Follow-Up: At [facility], you and your health needs are our priority.  As part of our continuing mission to provide you with exceptional heart care, we have created designated Provider Care Teams.  These Care Teams include your primary Cardiologist (physician) and Advanced Practice Providers (APPs -  Physician Assistants and Nurse Practitioners) who all work together to provide you with the care you need, when you need it. You will need a follow up appointment in 6 months with a pacemaker check. You may see Sanda Klein, MD or one of the following Advanced Practice Providers on your designated Care Team: [place], [place] . Fabian Sharp, PA-C . You will receive a reminder letter in the mail two months in advance. If you don't receive a letter, please call our office to schedule the follow-up appointment.     Signed, Sanda Klein, MD  06/23/2018 1:10 PM    [facility] [place], [place], [place]  [address] Phone: [phone]; Fax: [phone]

## 2018-07-03 ENCOUNTER — Other Ambulatory Visit: Payer: Self-pay | Admitting: Cardiovascular Disease

## 2018-07-07 ENCOUNTER — Other Ambulatory Visit: Payer: Self-pay | Admitting: Cardiovascular Disease

## 2018-07-20 ENCOUNTER — Ambulatory Visit (INDEPENDENT_AMBULATORY_CARE_PROVIDER_SITE_OTHER): Payer: Medicare Other | Admitting: Podiatry

## 2018-07-20 ENCOUNTER — Encounter: Payer: Self-pay | Admitting: Podiatry

## 2018-07-20 ENCOUNTER — Ambulatory Visit (INDEPENDENT_AMBULATORY_CARE_PROVIDER_SITE_OTHER): Payer: Medicare Other

## 2018-07-20 DIAGNOSIS — S92321A Displaced fracture of second metatarsal bone, right foot, initial encounter for closed fracture: Secondary | ICD-10-CM

## 2018-07-21 NOTE — Progress Notes (Signed)
Subjective:   Patient ID: [name], female   DOB: 85 y.o.   MRN: [id number]   HPI Patient states the pain is reducing but I still have some swelling in the right foot and I been gradually out of the boot   ROS      Objective:  Physical Exam  Neurovascular status intact with discomfort in the third metatarsal shaft right that is painful when palpated but improved from previous     Assessment:  Probability for stress fracture of the third metatarsal right foot midshaft     Plan:  H&P x-ray reviewed and we reviewed the intense healing occurring and I explained the possibility for fracture of the next metatarsal bone.  At this point patient will increase activity slowly but not do anything intense and will be seen back as needed  X-ray indicates severe fracture of the metatarsal mid shaft with multiple signs of healing in a secondary fashion

## 2018-07-25 LAB — CUP PACEART INCLINIC DEVICE CHECK
Date Time Interrogation Session: 20200204142247
Implantable Lead Implant Date: 20090605
MDC IDC LEAD IMPLANT DT: 20090601
MDC IDC LEAD LOCATION: 753859
MDC IDC LEAD LOCATION: 753860
MDC IDC PG IMPLANT DT: 20180822
MDC IDC PG SERIAL: 8932617
Pulse Gen Model: 2272

## 2018-08-02 ENCOUNTER — Other Ambulatory Visit: Payer: Self-pay | Admitting: *Deleted

## 2018-08-02 MED ORDER — DILTIAZEM HCL ER COATED BEADS 180 MG PO CP24: 180.0000 mg | ORAL_CAPSULE | Freq: Every day | ORAL | 6 refills | Status: DC

## 2018-09-13 ENCOUNTER — Ambulatory Visit (INDEPENDENT_AMBULATORY_CARE_PROVIDER_SITE_OTHER): Payer: Medicare Other | Admitting: *Deleted

## 2018-09-13 DIAGNOSIS — I495 Sick sinus syndrome: Secondary | ICD-10-CM | POA: Diagnosis not present

## 2018-09-15 LAB — CUP PACEART REMOTE DEVICE CHECK
Battery Remaining Longevity: 122 mo
Battery Remaining Percentage: 95.5 %
Battery Voltage: 2.99 V
Brady Statistic AP VP Percent: 97 %
Brady Statistic RA Percent Paced: 96 %
Brady Statistic RV Percent Paced: 99 %
Date Time Interrogation Session: 20200326034717
Implantable Lead Implant Date: 20090601
Implantable Lead Implant Date: 20090605
Implantable Lead Location: 753859
Implantable Pulse Generator Implant Date: 20180822
Lead Channel Impedance Value: 600 Ohm
Lead Channel Pacing Threshold Amplitude: 0.5 V
Lead Channel Pacing Threshold Pulse Width: 0.4 ms
Lead Channel Sensing Intrinsic Amplitude: 2.9 mV
Lead Channel Setting Pacing Amplitude: 1.5 V
Lead Channel Setting Sensing Sensitivity: 4 mV
MDC IDC LEAD LOCATION: 753860
MDC IDC MSMT LEADCHNL RV IMPEDANCE VALUE: 480 Ohm
MDC IDC MSMT LEADCHNL RV PACING THRESHOLD AMPLITUDE: 0.625 V
MDC IDC MSMT LEADCHNL RV PACING THRESHOLD PULSEWIDTH: 0.4 ms
MDC IDC PG SERIAL: 8932617
MDC IDC SET LEADCHNL RV PACING AMPLITUDE: 0.875
MDC IDC SET LEADCHNL RV PACING PULSEWIDTH: 0.4 ms
MDC IDC STAT BRADY AP VS PERCENT: 1 %
MDC IDC STAT BRADY AS VP PERCENT: 3.5 %
MDC IDC STAT BRADY AS VS PERCENT: 1 %

## 2018-09-18 ENCOUNTER — Encounter: Payer: Self-pay | Admitting: Cardiology

## 2018-09-18 ENCOUNTER — Other Ambulatory Visit: Payer: Self-pay

## 2018-09-18 NOTE — Progress Notes (Signed)
Remote pacemaker transmission.   

## 2018-09-20 ENCOUNTER — Telehealth: Payer: Self-pay

## 2018-09-20 NOTE — Telephone Encounter (Signed)
Virtual Visit Pre-Appointment Phone Call  Steps For Call:  1. Confirm consent - "In the setting of the current Covid19 crisis, you are scheduled for a (phone or video) visit with your provider on (date) at (time).  Just as we do with many in-office visits, in order for you to participate in this visit, we must obtain consent.  If you'd like, I can send this to your mychart (if signed up) or email for you to review.  Otherwise, I can obtain your verbal consent now.  All virtual visits are billed to your insurance company just like a normal visit would be.  By agreeing to a virtual visit, we'd like you to understand that the technology does not allow for your provider to perform an examination, and thus may limit your provider's ability to fully assess your condition.  Finally, though the technology is pretty good, we cannot assure that it will always work on either your or our end, and in the setting of a video visit, we may have to convert it to a phone-only visit.  In either situation, we cannot ensure that we have a secure connection.  Are you willing to proceed?"  2. Give patient instructions for WebEx download to smartphone as below if video visit  3. Advise patient to be prepared with any vital sign or heart rhythm information, their current medicines, and a piece of paper and pen handy for any instructions they may receive the day of their visit  4. Inform patient they will receive a phone call 15 minutes prior to their appointment time (may be from unknown caller ID) so they should be prepared to answer  5. Confirm that appointment type is correct in Epic appointment notes (video vs telephone)    TELEPHONE CALL NOTE  Garrett Bowring has been deemed a candidate for a follow-up tele-health visit to limit community exposure during the Covid-19 pandemic. I spoke with the patient via phone to ensure availability of phone/video source, confirm preferred email & phone number, and discuss  instructions and expectations.  I reminded [name] to be prepared with any vital sign and/or heart rhythm information that could potentially be obtained via home monitoring, at the time of her visit. I reminded [name] to expect a phone call at the time of her visit if her visit.  Did the patient verbally acknowledge consent to treatment? Patient provided verbal consent.  [name], [place] 09/20/2018 3:03 PM   DOWNLOADING THE [place]  - If Apple, go to [employer] and type in WebEx in the search bar. [place] [employer], the blue/green circle. The app is free but as with any other app downloads, their phone may require them to verify saved payment information or Apple password. The patient does NOT have to create an account.  - If Android, ask patient to go to [employer] and type in WebEx in the search bar. [place] [employer], the blue/green circle. The app is free but as with any other app downloads, their phone may require them to verify saved payment information or Android password. The patient does NOT have to create an account.   CONSENT FOR TELE-HEALTH VISIT - PLEASE REVIEW  I hereby voluntarily request, consent and authorize [facility] and its employed or contracted physicians, physician assistants, nurse practitioners or other licensed health care professionals (the Practitioner), to provide me with telemedicine health care services (the "Services") as deemed necessary by the treating Practitioner.  I acknowledge and consent to receive the Services by the Practitioner via telemedicine. I understand that the telemedicine visit will involve communicating with the Practitioner through live audiovisual communication technology and the disclosure of certain medical information by electronic transmission. I acknowledge that I have been given the opportunity to request an in-person assessment or other available alternative prior to  the telemedicine visit and am voluntarily participating in the telemedicine visit.  I understand that I have the right to withhold or withdraw my consent to the use of telemedicine in the course of my care at any time, without affecting my right to future care or treatment, and that the Practitioner or I may terminate the telemedicine visit at any time. I understand that I have the right to inspect all information obtained and/or recorded in the course of the telemedicine visit and may receive copies of available information for a reasonable fee.  I understand that some of the potential risks of receiving the Services via telemedicine include:  Marland Kitchen Delay or interruption in medical evaluation due to technological equipment failure or disruption; . Information transmitted may not be sufficient (e.g. poor resolution of images) to allow for appropriate medical decision making by the Practitioner; and/or  . In rare instances, security protocols could fail, causing a breach of personal health information.  Furthermore, I acknowledge that it is my responsibility to provide information about my medical history, conditions and care that is complete and accurate to the best of my ability. I acknowledge that Practitioner's advice, recommendations, and/or decision may be based on factors not within their control, such as incomplete or inaccurate data provided by me or distortions of diagnostic images or specimens that may result from electronic transmissions. I understand that the practice of medicine is not an exact science and that Practitioner makes no warranties or guarantees regarding treatment outcomes. I acknowledge that I will receive a copy of this consent concurrently upon execution via email to the email address I last provided but may also request a printed copy by calling the office of [place].    I understand that my insurance will be billed for this visit.   I have read or had this consent read to  me. . I understand the contents of this consent, which adequately explains the benefits and risks of the Services being provided via telemedicine.  . I have been provided ample opportunity to ask questions regarding this consent and the Services and have had my questions answered to my satisfaction. . I give my informed consent for the services to be provided through the use of telemedicine in my medical care  By participating in this telemedicine visit I agree to the above.

## 2018-09-21 ENCOUNTER — Other Ambulatory Visit: Payer: Self-pay

## 2018-09-22 ENCOUNTER — Encounter: Payer: Self-pay | Admitting: Cardiovascular Disease

## 2018-09-22 ENCOUNTER — Telehealth (INDEPENDENT_AMBULATORY_CARE_PROVIDER_SITE_OTHER): Payer: Medicare Other | Admitting: Cardiovascular Disease

## 2018-09-22 DIAGNOSIS — K219 Gastro-esophageal reflux disease without esophagitis: Secondary | ICD-10-CM

## 2018-09-22 DIAGNOSIS — I10 Essential (primary) hypertension: Secondary | ICD-10-CM

## 2018-09-22 DIAGNOSIS — E7800 Pure hypercholesterolemia, unspecified: Secondary | ICD-10-CM

## 2018-09-22 DIAGNOSIS — I495 Sick sinus syndrome: Secondary | ICD-10-CM

## 2018-09-22 DIAGNOSIS — I442 Atrioventricular block, complete: Secondary | ICD-10-CM | POA: Diagnosis not present

## 2018-09-22 DIAGNOSIS — I471 Supraventricular tachycardia: Secondary | ICD-10-CM

## 2018-09-22 NOTE — Patient Instructions (Signed)
Medication Instructions:  The current medical regimen is effective;  continue present plan and medications.  If you need a refill on your cardiac medications before your next appointment, please call your pharmacy.   Follow-Up: At [facility], you and your health needs are our priority.  As part of our continuing mission to provide you with exceptional heart care, we have created designated Provider Care Teams.  These Care Teams include your primary Cardiologist (physician) and Advanced Practice Providers (APPs -  Physician Assistants and Nurse Practitioners) who all work together to provide you with the care you need, when you need it. You will need a follow up appointment in 6 months.  Please call our office 2 months in advance to schedule this appointment.  You may see Dr.Kelly or one of the following Advanced Practice Providers on your designated Care Team: Almyra Deforest, [place] . Fabian Sharp, PA-C  Any Other Special Instructions Will Be Listed Below (If Applicable). Monitor BP at home a few times a week, if consistently above 140 for the top number please call us so we can make adjustments.

## 2018-09-22 NOTE — Progress Notes (Signed)
Virtual Visit via Telephone Note     Evaluation Performed:  Follow-up visit  This visit type was conducted due to national recommendations for restrictions regarding the COVID-19 Pandemic (e.g. social distancing).  This format is felt to be most appropriate for this patient at this time.  All issues noted in this document were discussed and addressed.  No physical exam was performed (except for noted visual exam findings with Video Visits).  Please refer to the patient's chart (MyChart message for video visits and phone note for telephone visits) for the patient's consent to telehealth for [facility].  Verbal consent was obtained with the patient to proceed with this visit and bill insurance.  Date:  09/22/2018   ID:  [name], DOB 03-Mar-1932, MRN [id number]  Patient Location [address]  [place] [address]   Provider location:   [facility]  PCP:  [name] Skeens, MD  Cardiologist: [name] Sine MD Electrophysiologist:  None   Chief Complaint: 68-month follow-up evaluation for hypertension, pacemaker, hyperlipidemia, history of paroxysmal atrial tachycardia/AF  History of Present Illness:    [name] is a 85 y.o. female who presents via audio/video conferencing for a telehealth visit today.    The patient does not have symptoms concerning for COVID-19 infection (fever, chills, cough, or new SHORTNESS OF BREATH).   [name] underwent dual-chamber pacemaker implantation for complete heart block. She has a history of paroxysmal atrial fibrillation, a history of lower extremity edema as well as GERD. She also has a history of hypertension as well as hyperlipidemia.   A follow-up echo Doppler study on 10/23/2014 revealed an ejection fraction in the 50-55% range with grade 1 diastolic dysfunction and mild mitral regurgitation. Her last pacemaker interrogation was in June 2017 by Dr. Sallyanne [name].  At that time, she had normal pacemaker function and had  97% atrial pacing and 100% ventricular pacing.  There was no underlying escape rhythm.  Estimated longevity was 1.75-2.25 years.  Typically she goes to bed at midnight and wakes up at 6 AM.  She experiences a dry cough and is unaware of any postnasal drip.  She has a history of GERD.  She denies recent wheezing. She denies presyncope or syncope. At times she admits to intermittent leg swelling. She has a history of hypothyroidism on thyroid replacement.    She sees Dr. Elyse [name] for primary and endocrinologic care and saw him on 08/03/2016.  He is now part of the [facility].  Her recent lipid study on pravastatin from February 2018 showed a total cholesterol 137, triglycerides 89, HDL 61, and LDL 69.; Hemoglobin A1c was 5.8.  TSH was 3.2.  Vitamin D level was 42.   She underwent a dual pacemaker generator changeout on 02/09/2017  after her battery reached ERI. She has had an evaluation for reflux related laryngitis/cough by Dr. Constance [name].  She had remotely been on allergy shots but stopped in early 2018.  Since I last saw her in October 2018, she has done well and has been undergoing every 61-month pacemaker checks.  Unfortunately, she had broken her second metatarsal of her right foot in November 2019 and required wearing a boot for some time.  Her ambulation has been somewhat difficult since and she does use a cane for ambulation.  She has difficulty walking, and when doing so admits to leg fatigue and some hip discomfort.  She denies any exertional shortness of breath or chest tightness.  She is unaware of recent palpitations  on her current therapy which includes diltiazem 180 mg, metoprolol succinate 25 mg daily and in addition for blood pressure control she also is on valsartan HCT 160/25 mg at times she does admit to some occasional ankle swelling at the end of the day but this is normalized in the morning.  She admits to sleeping well.  She is unaware of any recurrent episodes of  atrial fibrillation.  She continues to be on pravastatin for hyperlipidemia.  I reviewed the medical records of Dr. all-time her.  She states that when she last saw Dr. Elyse [name] her blood pressure on July 03, 2018 was 118/50.  Her LDL cholesterol at that time was 85 in December 2019 which had slightly improved from 3 months previously when her LDL was 54.  She presents for telemedicine evaluation.   Prior CV studies:   The following studies were reviewed today:  ------------------------------------------------------------------- ECHO Study Conclusions 5/4/201  - Left ventricle: The cavity size was normal. Wall thickness was   normal. Systolic function was normal. The estimated ejection   fraction was in the range of 50% to 55%. Wall motion was normal;   there were no regional wall motion abnormalities. Doppler   parameters are consistent with abnormal left ventricular   relaxation (grade 1 diastolic dysfunction). - Mitral valve: There was mild regurgitation.   I have also reviewed the recent evaluation by Dr. Sallyanne [name], Dr. Elyse [name], as well as her evaluation by Dr. Paulla [name].  Past Medical History:  Diagnosis Date   Allergic rhinitis    Asthmatic bronchitis    Chronic cough    Esophageal reflux    GERD (gastroesophageal reflux disease)    History of stress test 11/20/2009   This was essentially normal,post-stress EF was hyperdynamic at 73 beats per mins, There was no scar or ischemia.   Hx of echocardiogram 11/05/2010   EF>55% showed a normal systolic function with impaired diastolic dysfuntion and also tissue doppler suggesting increased elevated left atrial pressure, she had moderate LA dilatation and mild LA dilatation. There was calcified mitral apparatus with moderate MR, mild to moderate TR and trace aortic insufficiency.   Hyperlipemia    Hypertension    PAF (paroxysmal atrial fibrillation) [facility])    Past Surgical History:  Procedure Laterality Date    CHOLECYSTECTOMY     FOOT SURGERY     right   NASAL SEPTUM SURGERY     PACEMAKER INSERTION  June 1st 2009   [employer] [place] device, model #5826, serial [id number], The artial lead is a Interior and spatial designer, the ventricular lead is Environmental education officer.   PPM GENERATOR CHANGEOUT N/A 02/09/2017   Procedure: PPM GENERATOR CHANGEOUT;  Surgeon: Sanda Klein, MD;  Location: [place] [facility];  Service: Cardiovascular;  Laterality: N/A;   THYROID SURGERY     TONSILLECTOMY     VESICOVAGINAL FISTULA CLOSURE W/ TAH       Current Meds  Medication Sig   Ascorbic Acid (VITAMIN C PO) Take 2 tablets by mouth daily.   aspirin 81 MG tablet Take 81 mg by mouth at bedtime.    Cholecalciferol (VITAMIN D-1000 MAX ST) 25 MCG (1000 UT) tablet Take 1 tablet once a day   cromolyn (OPTICROM) 4 % ophthalmic solution INSTILL 1 DROP INTO BOTH EYES 4 TIMES A DAY   diltiazem (CARDIZEM CD) 180 MG 24 hr capsule Take 1 capsule (180 mg total) by mouth daily.   LORazepam (ATIVAN) 0.5 MG tablet Take 0.5 mg by mouth daily as needed  for anxiety.    metoprolol succinate (TOPROL-XL) 25 MG 24 hr tablet TAKE 1 TABLET BY MOUTH EVERY DAY   pravastatin (PRAVACHOL) 40 MG tablet TAKE 1 TABLET BY MOUTH EVERY DAY   Simethicone (GAS-X PO) Take 1 tablet by mouth daily as needed (gas).   SYNTHROID 25 MCG tablet Take 25 mcg by mouth daily before breakfast.    valsartan-hydrochlorothiazide (DIOVAN-HCT) 160-25 MG tablet TAKE 1 TABLET BY MOUTH EVERY DAY     Allergies:   Fluticasone-salmeterol; Sulfonamide derivatives; Benzonatate; Erythromycin; Meloxicam; and Sulfamethoxazole   Social History   Tobacco Use   Smoking status: Never Smoker   Smokeless tobacco: Never Used  Substance Use Topics   Alcohol use: No   Drug use: No     Family Hx: The patient's family history includes Cancer in her father; Stroke in her mother and sister.  ROS:   Please see the history of present illness.    Leg fatigue following walking since her  metatarsal fracture.  No exertional chest pain.  No change in vision.  No shortness of breath.  Positive for GERD now on pantoprazole.  No change in weight.  Intermittent ankle swelling.  No difficulty with sleep. All other systems reviewed and are negative.   Labs/Other Tests and Data Reviewed:    Recent Labs: No results found for requested labs within last 8760 hours.   Recent Lipid Panel Lab Results  Component Value Date/Time   CHOL 136 03/13/2015 02:36 PM   TRIG 95 03/13/2015 02:36 PM   HDL 57 03/13/2015 02:36 PM   CHOLHDL 2.4 03/13/2015 02:36 PM   [place] 60 03/13/2015 02:36 PM    Wt Readings from Last 3 Encounters:  06/23/18 158 lb (71.7 kg)  06/15/17 162 lb 9.6 oz (73.8 kg)  03/23/17 159 lb 12.8 oz (72.5 kg)     Exam:    Vital Signs:  BP (!) 148/61    Pulse 74    Ht 4\' 11"  (1.499 m)    BMI 31.91 kg/m    Well nourished, well developed female in no acute distress. Her respiration appears normal.  Breathing is not labored. She denies any neck vein distention.  She denies any wheezing episodes.  She does have occasional GERD.  She denies chest tightness or any tenderness to palpation when she pushes on her chest.  She denies any abdominal cramps or tenderness.  She admits to intermittent ankle swelling typically at the end of the day which is resolved in the morning after sleep.  She is bothered by leg fatigue.  She denies any awareness of tremors.  She denies any neurologic issues.  She has a normal affect.  ASSESSMENT & PLAN:    1.  Essential hypertension: Currently, she is on valsartan HCT 160/25, diltiazem CD 180 mg, and metoprolol succinate 25 mg.  Her resting pulse is 74.  Her blood pressure today is elevated at 148/61.  She states her blood pressure when she last saw Dr. Elyse [name] was normal at 118/50.  She was instructed to monitor her blood pressure at least several days per week.  If she consistently notes blood pressures in the upper 30s or 140s or above she may  require additional medication adjustment for more optimal blood pressure lowering with ideal blood pressure less than 120/80 and stage I hypertension beginning at 130/80.  2.  Underlying complete heart block/status post pacemaker plantation: Initial pacemaker insertion June 2009 with permanent pacemaker generator change out August 2018.   3. History of PAF:  Apparently maintaining sinus rhythm.  Reported remote episode of paroxysmal atrial tachycardia.  She denies current palpitations on metoprolol and diltiazem.  4.  Hyperlipidemia: I reviewed the records of Dr. all-time her.  She continues to be on pravastatin 40 mg.  LDL cholesterol was outstanding at 54 in mid 2019 which had increased slightly to 85 in December 2019.  There is no underlying CAD or PVD or diabetes mellitus.  5.  GERD: Currently controlled on pantoprazole.  COVID-19 Education: The signs and symptoms of COVID-19 were discussed with the patient and how to seek care for testing (follow up with PCP or arrange E-visit).  The importance of social distancing was discussed today.  Patient Risk:   After full review of this patients clinical status, I feel that they are at least moderate risk at this time.  Time:   Today, I have spent 24 minutes with the patient with telehealth technology discussing her blood pressure, heart block, pacemaker, palpitations, hypertension, lipids, sodium restriction with ankle edema, weight loss.     Medication Adjustments/Labs and Tests Ordered: Current medicines are reviewed at length with the patient today.  Concerns regarding medicines are outlined above.  Tests Ordered: No orders of the defined types were placed in this encounter.  Medication Changes: No orders of the defined types were placed in this encounter.   Disposition: 6 months  Signed, Shelva Majestic, MD  09/22/2018 1:18 PM    [facility]

## 2018-12-13 ENCOUNTER — Ambulatory Visit (INDEPENDENT_AMBULATORY_CARE_PROVIDER_SITE_OTHER): Payer: Medicare Other | Admitting: *Deleted

## 2018-12-13 DIAGNOSIS — I442 Atrioventricular block, complete: Secondary | ICD-10-CM | POA: Diagnosis not present

## 2018-12-13 LAB — CUP PACEART REMOTE DEVICE CHECK
Battery Remaining Longevity: 121 mo
Battery Remaining Percentage: 95.5 %
Battery Voltage: 3.01 V
Brady Statistic AP VP Percent: 97 %
Brady Statistic AP VS Percent: 1 %
Brady Statistic AS VP Percent: 3 %
Brady Statistic AS VS Percent: 1 %
Brady Statistic RA Percent Paced: 97 %
Brady Statistic RV Percent Paced: 99 %
Date Time Interrogation Session: 20200624063044
Implantable Lead Implant Date: 20090601
Implantable Lead Implant Date: 20090605
Implantable Lead Location: 753859
Implantable Lead Location: 753860
Implantable Pulse Generator Implant Date: 20180822
Lead Channel Impedance Value: 430 Ohm
Lead Channel Impedance Value: 600 Ohm
Lead Channel Pacing Threshold Amplitude: 0.375 V
Lead Channel Pacing Threshold Amplitude: 0.75 V
Lead Channel Pacing Threshold Pulse Width: 0.4 ms
Lead Channel Pacing Threshold Pulse Width: 0.4 ms
Lead Channel Sensing Intrinsic Amplitude: 2.6 mV
Lead Channel Setting Pacing Amplitude: 1 V
Lead Channel Setting Pacing Amplitude: 1.375
Lead Channel Setting Pacing Pulse Width: 0.4 ms
Lead Channel Setting Sensing Sensitivity: 4 mV
Pulse Gen Model: 2272
Pulse Gen Serial Number: 8932617

## 2018-12-25 ENCOUNTER — Encounter: Payer: Self-pay | Admitting: Cardiology

## 2018-12-25 NOTE — Progress Notes (Signed)
Remote pacemaker transmission.   

## 2019-01-26 ENCOUNTER — Other Ambulatory Visit: Payer: Self-pay

## 2019-01-26 ENCOUNTER — Ambulatory Visit (INDEPENDENT_AMBULATORY_CARE_PROVIDER_SITE_OTHER): Payer: Medicare Other | Admitting: Cardiovascular Disease

## 2019-01-26 ENCOUNTER — Encounter: Payer: Self-pay | Admitting: Cardiovascular Disease

## 2019-01-26 VITALS — BP 146/65 | HR 76 | Temp 97.9°F | Ht <= 58 in | Wt 161.0 lb

## 2019-01-26 DIAGNOSIS — E7800 Pure hypercholesterolemia, unspecified: Secondary | ICD-10-CM

## 2019-01-26 DIAGNOSIS — I471 Supraventricular tachycardia: Secondary | ICD-10-CM

## 2019-01-26 DIAGNOSIS — I495 Sick sinus syndrome: Secondary | ICD-10-CM | POA: Diagnosis not present

## 2019-01-26 DIAGNOSIS — Z950 Presence of cardiac pacemaker: Secondary | ICD-10-CM | POA: Diagnosis not present

## 2019-01-26 DIAGNOSIS — I10 Essential (primary) hypertension: Secondary | ICD-10-CM

## 2019-01-26 DIAGNOSIS — I480 Paroxysmal atrial fibrillation: Secondary | ICD-10-CM

## 2019-01-26 DIAGNOSIS — I442 Atrioventricular block, complete: Secondary | ICD-10-CM | POA: Diagnosis not present

## 2019-01-26 NOTE — Patient Instructions (Signed)
Medication Instructions:  Please take the Diltiazem, Pravastatin and Metoprolol in the evening.   If you need a refill on your cardiac medications before your next appointment, please call your pharmacy.   Lab work: None ordered If you have labs (blood work) drawn today and your tests are completely normal, you will receive your results only by: [place] (if you have MyChart) OR A paper copy in the mail If you have any lab test that is abnormal or we need to change your treatment, we will call you to review the results.  Testing/Procedures: None ordered  Follow-Up: At [facility], you and your health needs are our priority.  As part of our continuing mission to provide you with exceptional heart care, we have created designated Provider Care Teams.  These Care Teams include your primary Cardiologist (physician) and Advanced Practice Providers (APPs -  Physician Assistants and Nurse Practitioners) who all work together to provide you with the care you need, when you need it. You will need a follow up appointment in 6 months.  Please call our office 2 months in advance to schedule this appointment.  You may see Sanda Klein, MD or one of the following Advanced Practice Providers on your designated Care Team: Almyra Deforest, PA-C Fabian Sharp, [place]

## 2019-01-26 NOTE — Progress Notes (Signed)
Patient ID: [name], female   DOB: [birth year], 85 y.o.   MRN: [id number] Patient ID: [name], female   DOB: 08-Jun-1932, 85 y.o.   MRN: [id number]    Cardiology Office Note    Date:  01/27/2019   ID:  [name], DOB [birth year], MRN [id number]  PCP:  Lorne Skeens, MD  Cardiologist: Shelva Majestic, M.D.;  Sanda Klein, MD   Chief Complaint  Patient presents with   Fatigue   Pacemaker Check    History of Present Illness:  [name] is a 85 y.o. female who returns in follow-up for sinus node dysfunction, complete heart block, paroxysmal atrial .fibrillation and pacemaker check.    Her initial pacemaker was implanted for complete heart block in 2009.  She underwent pacemaker change out for ERI in August 2018 (St. Jude Assurity MRI, but leads are not MRI conditional).  She has developed symptoms of sinus node dysfunction as well and has almost 100% atrial pacing.  Rate response sensor was turned on with some improvement in her symptoms of fatigue but she still feels that she has some difficulty with household chores.    She feels particularly tired around 10:00 in the morning, roughly 2 hours after taking her morning medications.  She takes valsartan-hydrochlorothiazide and metoprolol in the morning but takes her diltiazem in the evenings.  She is also taking her pravastatin in the morning.  Current device check shows normal function with an estimated generator longevity of 10 years.  She has 97% atrial pacing and 100% ventricular pacing.  Underlying rhythm is sinus bradycardia around 40 bpm with complete heart block.  She has had very brief episodes of atrial mode switch, none of them sustained, overall burden well under 1%, without any recent episodes of true atrial fibrillation.  Heart rate histograms remain a little bit blunted and the rate response slope was increased from 9-10 today.  Otherwise the device is already programmed relatively aggressively, with a low threshold  for activation.   The patient specifically denies any chest pain at rest or exertion, dyspnea at rest or with exertion, orthopnea, paroxysmal nocturnal dyspnea, syncope, palpitations, focal neurological deficits, intermittent claudication, lower extremity edema, unexplained weight gain, cough, hemoptysis or wheezing.  She continues to report falling asleep very easily and promptly throughout the day.  She wakes up feeling tired.  She has a lot of symptoms suggestive of obstructive sleep apnea, although she had a reportedly negative sleep study several years ago (I cannot find a record of it; must be before 2011).  Past Medical History:  Diagnosis Date   Allergic rhinitis    Asthmatic bronchitis    Chronic cough    Esophageal reflux    GERD (gastroesophageal reflux disease)    History of stress test 11/20/2009   This was essentially normal,post-stress EF was hyperdynamic at 73 beats per mins, There was no scar or ischemia.   Hx of echocardiogram 11/05/2010   EF>55% showed a normal systolic function with impaired diastolic dysfuntion and also tissue doppler suggesting increased elevated left atrial pressure, she had moderate LA dilatation and mild LA dilatation. There was calcified mitral apparatus with moderate MR, mild to moderate TR and trace aortic insufficiency.   Hyperlipemia    Hypertension    PAF (paroxysmal atrial fibrillation) ([place])     Past Surgical History:  Procedure Laterality Date   CHOLECYSTECTOMY     FOOT SURGERY     right   NASAL SEPTUM SURGERY     PACEMAKER INSERTION  June 1st 2009   St Jude Spencer device, model #5826, serial [id number], The artial lead is a Interior and spatial designer, the ventricular lead is Environmental education officer.   PPM GENERATOR CHANGEOUT N/A 02/09/2017   Procedure: PPM GENERATOR CHANGEOUT;  Surgeon: Sanda Klein, MD;  Location: [facility];  Service: Cardiovascular;  Laterality: N/A;   THYROID SURGERY     TONSILLECTOMY     VESICOVAGINAL  FISTULA CLOSURE W/ TAH      Current Outpatient Medications  Medication Sig Dispense Refill   Ascorbic Acid (VITAMIN C PO) Take 2 tablets by mouth daily.     aspirin 81 MG tablet Take 81 mg by mouth at bedtime.      Cholecalciferol (VITAMIN D-1000 MAX ST) 25 MCG (1000 UT) tablet Take 1 tablet once a day     cromolyn (OPTICROM) 4 % ophthalmic solution INSTILL 1 DROP INTO BOTH EYES 4 TIMES A DAY     diltiazem (CARDIZEM CD) 180 MG 24 hr capsule Take 1 capsule (180 mg total) by mouth daily. 30 capsule 6   LORazepam (ATIVAN) 0.5 MG tablet Take 0.5 mg by mouth daily as needed for anxiety.      metoprolol succinate (TOPROL-XL) 25 MG 24 hr tablet TAKE 1 TABLET BY MOUTH EVERY DAY 30 tablet 11   pantoprazole (PROTONIX) 40 MG tablet Take 40 mg by mouth daily.     pravastatin (PRAVACHOL) 40 MG tablet TAKE 1 TABLET BY MOUTH EVERY DAY 30 tablet 11   Simethicone (GAS-X PO) Take 1 tablet by mouth daily as needed (gas).     SYNTHROID 25 MCG tablet Take 25 mcg by mouth daily before breakfast.   12   valsartan-hydrochlorothiazide (DIOVAN-HCT) 160-25 MG tablet TAKE 1 TABLET BY MOUTH EVERY DAY 30 tablet 6   No current facility-administered medications for this visit.     Allergies:   Fluticasone-salmeterol, Sulfonamide derivatives, Benzonatate, Erythromycin, Meloxicam, and Sulfamethoxazole   Social History   Socioeconomic History   Marital status: Married    Spouse name: Not on file   Number of children: Not on file   Years of education: Not on file   Highest education level: Not on file  Occupational History    Employer: RETIRED    Comment: Retired Sales promotion account executive at [name]: Not on file   Food insecurity    Worry: Not on file    Inability: Not on file   Transportation needs    Medical: Not on file    Non-medical: Not on file  Tobacco Use   Smoking status: Never Smoker   Smokeless tobacco: Never Used  Substance and  Sexual Activity   Alcohol use: No   Drug use: No   Sexual activity: Not on file  Lifestyle   Physical activity    Days per week: Not on file    Minutes per session: Not on file   Stress: Not on file  Relationships   Social connections    Talks on phone: Not on file    Gets together: Not on file    Attends religious service: Not on file    Active member of club or organization: Not on file    Attends meetings of clubs or organizations: Not on file    Relationship status: Not on file  Other Topics Concern   Not on file  Social History Narrative   Not on file     Family History:  The patient's family history  includes Cancer in her father; Stroke in her mother and sister.   ROS:   Please see the history of present illness.    Review of Systems  Cardiovascular: Positive for dyspnea on exertion.  Musculoskeletal: Positive for arthritis, back pain, muscle cramps and stiffness.   All other systems reviewed and are negative.   PHYSICAL EXAM:   VS:  BP (!) 146/65    Pulse 76    Temp 97.9 F (36.6 C)    Ht 4\' 10"  (1.473 m)    Wt 161 lb (73 kg)    SpO2 97%    BMI 33.65 kg/m      General: Alert, oriented x3, no distress, obese.  Well-healed right subclavian pacemaker site. Head: no evidence of trauma, PERRL, EOMI, no exophtalmos or lid lag, no myxedema, no xanthelasma; normal ears, nose and oropharynx Neck: normal jugular venous pulsations and no hepatojugular reflux; brisk carotid pulses without delay and no carotid bruits Chest: clear to auscultation, no signs of consolidation by percussion or palpation, normal fremitus, symmetrical and full respiratory excursions Cardiovascular: normal position and quality of the apical impulse, regular rhythm, normal first and paradoxically split second heart sounds, no murmurs, rubs or gallops Abdomen: no tenderness or distention, no masses by palpation, no abnormal pulsatility or arterial bruits, normal bowel sounds, no  hepatosplenomegaly Extremities: no clubbing, cyanosis or edema; 2+ radial, ulnar and brachial pulses bilaterally; 2+ right femoral, posterior tibial and dorsalis pedis pulses; 2+ left femoral, posterior tibial and dorsalis pedis pulses; no subclavian or femoral bruits Neurological: grossly nonfocal Psych: Normal mood and affect   Wt Readings from Last 3 Encounters:  01/26/19 161 lb (73 kg)  06/23/18 158 lb (71.7 kg)  06/15/17 162 lb 9.6 oz (73.8 kg)      Studies/Labs Reviewed:   EKG:  EKG is not ordered today.  The intracardiac electrogram shows atrial paced, ventricular paced rhythm Recent Labs: No results found for requested labs within last 8760 hours.   Lipid Panel    Component Value Date/Time   CHOL 136 03/13/2015 1436   TRIG 95 03/13/2015 1436   HDL 57 03/13/2015 1436   CHOLHDL 2.4 03/13/2015 1436   VLDL 19 03/13/2015 1436   LDLCALC 60 03/13/2015 1436    September 2017 Total cholesterol 155, triglycerides 121, HDL 61, LDL 70 May 2018 total cholesterol 151, triglycerides 82, HDL 55, LDL 86  ASSESSMENT:    1. Pacemaker   2. CHB (complete heart block) (H[facility])   3. SSS (sick sinus syndrome) ([place])   4. PAT (paroxysmal atrial tachycardia) (HCC)   5. Paroxysmal atrial fibrillation ([place])   6. Essential hypertension   7. Hypercholesteremia      PLAN:  In order of problems listed above:  1. CHB: Pacemaker dependent. No underlying escape rhythm. 2. SSS: Still has symptoms suggestive of chronotropic incompetence.  Reprogrammed as below.  However, I wonder whether her fatigue is more of an expression of obstructive sleep apnea. 3. PPM: normal device function.  Increased the rate response slope from 9-10.  Already programmed with low threshold and fast reaction time.  Download in 3 months. 4. PAT: brief asymptomatic events.  On low-dose beta-blocker and diltiazem.  These are likely contributing to her bradycardia. 5. AFib: This has not been recorded by her pacemaker in a  long time (at least since 2012 when I started monitoring her device).  She is only receiving aspirin and is not on anticoagulation.  6. HTN: Control is good most of the time  although her blood pressure is a little high today.  I asked her to start taking her metoprolol in the evenings to see if this helps with her fatigue and weakness in the mid morning. 7. HLP: Monitored by PCP.  I told her that taking the pravastatin at night rather than in the morning will make it more effective.   Medication Adjustments/Labs and Tests Ordered: Current medicines are reviewed at length with the patient today.  Concerns regarding medicines are outlined above.  Medication changes, Labs and Tests ordered today are listed below. Patient Instructions  Medication Instructions:  Please take the Diltiazem, Pravastatin and Metoprolol in the evening.   If you need a refill on your cardiac medications before your next appointment, please call your pharmacy.   Lab work: None ordered If you have labs (blood work) drawn today and your tests are completely normal, you will receive your results only by: [place] (if you have MyChart) OR A paper copy in the mail If you have any lab test that is abnormal or we need to change your treatment, we will call you to review the results.  Testing/Procedures: None ordered  Follow-Up: At [facility], you and your health needs are our priority.  As part of our continuing mission to provide you with exceptional heart care, we have created designated Provider Care Teams.  These Care Teams include your primary Cardiologist (physician) and Advanced Practice Providers (APPs -  Physician Assistants and Nurse Practitioners) who all work together to provide you with the care you need, when you need it. You will need a follow up appointment in 6 months.  Please call our office 2 months in advance to schedule this appointment.  You may see Sanda Klein, MD or one of the following  Advanced Practice Providers on your designated Care Team: Almyra Deforest, [place] Fabian Sharp, [place]         Patient Instructions  Medication Instructions:  Please take the Diltiazem, Pravastatin and Metoprolol in the evening.   If you need a refill on your cardiac medications before your next appointment, please call your pharmacy.   Lab work: None ordered If you have labs (blood work) drawn today and your tests are completely normal, you will receive your results only by: [place] (if you have MyChart) OR A paper copy in the mail If you have any lab test that is abnormal or we need to change your treatment, we will call you to review the results.  Testing/Procedures: None ordered  Follow-Up: At [facility], you and your health needs are our priority.  As part of our continuing mission to provide you with exceptional heart care, we have created designated Provider Care Teams.  These Care Teams include your primary Cardiologist (physician) and Advanced Practice Providers (APPs -  Physician Assistants and Nurse Practitioners) who all work together to provide you with the care you need, when you need it. You will need a follow up appointment in 6 months.  Please call our office 2 months in advance to schedule this appointment.  You may see Sanda Klein, MD or one of the following Advanced Practice Providers on your designated Care Team: Almyra Deforest, PA-C Fabian Sharp, [place]        Signed, Sanda Klein, MD  01/27/2019 11:56 AM    [address], [place], [place]  [address] Phone: [phone]; Fax: [phone]

## 2019-01-27 ENCOUNTER — Encounter: Payer: Self-pay | Admitting: Cardiovascular Disease

## 2019-02-08 ENCOUNTER — Other Ambulatory Visit: Payer: Self-pay | Admitting: Cardiovascular Disease

## 2019-02-08 NOTE — Telephone Encounter (Signed)
New message    Patient has no medication remaining!!    *STAT* If patient is at the pharmacy, call can be transferred to refill team.   1. Which medications need to be refilled? (please list name of each medication and dose if known) valsartan-hydrochlorothiazide (DIOVAN-HCT) 160-25 MG tablet  2. Which pharmacy/location (including street and city if local pharmacy) is medication to be sent to? [facility], [place] - [address]  3. Do they need a 30 day or 90 day supply? [place]

## 2019-02-08 NOTE — Telephone Encounter (Signed)
Pharmacy called following up on this request

## 2019-02-09 MED ORDER — VALSARTAN-HYDROCHLOROTHIAZIDE 160-25 MG PO TABS: 1.0000 | ORAL_TABLET | Freq: Every day | ORAL | 6 refills | Status: DC

## 2019-02-09 NOTE — Telephone Encounter (Signed)
Requested Prescriptions   Signed Prescriptions Disp Refills  . valsartan-hydrochlorothiazide (DIOVAN-HCT) 160-25 MG tablet 30 tablet 6    Sig: Take 1 tablet by mouth daily.    Authorizing Provider: Sanda Klein    Ordering User: Raelene Bott, BRANDY L

## 2019-02-27 ENCOUNTER — Other Ambulatory Visit: Payer: Self-pay | Admitting: Cardiovascular Disease

## 2019-03-06 ENCOUNTER — Other Ambulatory Visit: Payer: Self-pay

## 2019-03-06 ENCOUNTER — Ambulatory Visit (INDEPENDENT_AMBULATORY_CARE_PROVIDER_SITE_OTHER): Payer: Medicare Other | Admitting: Cardiovascular Disease

## 2019-03-06 ENCOUNTER — Encounter: Payer: Self-pay | Admitting: Cardiovascular Disease

## 2019-03-06 DIAGNOSIS — I10 Essential (primary) hypertension: Secondary | ICD-10-CM

## 2019-03-06 DIAGNOSIS — I480 Paroxysmal atrial fibrillation: Secondary | ICD-10-CM

## 2019-03-06 DIAGNOSIS — E7800 Pure hypercholesterolemia, unspecified: Secondary | ICD-10-CM

## 2019-03-06 DIAGNOSIS — K219 Gastro-esophageal reflux disease without esophagitis: Secondary | ICD-10-CM

## 2019-03-06 DIAGNOSIS — R0789 Other chest pain: Secondary | ICD-10-CM | POA: Diagnosis not present

## 2019-03-06 DIAGNOSIS — Z950 Presence of cardiac pacemaker: Secondary | ICD-10-CM | POA: Diagnosis not present

## 2019-03-06 DIAGNOSIS — I442 Atrioventricular block, complete: Secondary | ICD-10-CM | POA: Diagnosis not present

## 2019-03-06 NOTE — Progress Notes (Signed)
Cardiology Office Note    Date:  03/08/2019   ID:  [name], DOB Oct 14, 1931, MRN [id number]  PCP:  [name] Skeens, MD  Cardiologist:  [name] Majestic, MD   F/U evaluation  History of Present Illness:  [name] is a 85 y.o. female who underwent dual-chamber pacemaker implantation for complete heart block. She has a history of paroxysmal atrial fibrillation, a history of lower extremity edema as well as GERD. She also has a history of hypertension as well as hyperlipidemia. A follow-up echo Doppler study on 10/23/2014 revealed an ejection fraction in the 50-55% range with grade 1 diastolic dysfunction and mild mitral regurgitation. Her last pacemaker interrogation was in June 2017 by Dr. Sallyanne Kuster. At that time, she had normal pacemaker function and had 97% atrial pacing and 100% ventricular pacing. There was no underlying escape rhythm. Estimated longevity was 1.75-2.25 years.  Typically she goes to bed at midnight and wakes up at 6 AM. She experiences a dry cough and is unaware of any postnasal drip. She has a history of GERD. She denies recent wheezing. She denies presyncope or syncope. At times she admits to intermittent leg swelling. She has a history of hypothyroidism on thyroid replacement.   She sees Dr. Elyse [name] for primary and endocrinologic care and saw him on 08/03/2016. He is now part of the [place]. Her recent lipid study on pravastatin from February 2018 showed a total cholesterol 137, triglycerides 89, HDL 61, and LDL 69.; Hemoglobin A1c was 5.8. TSH was 3.2. Vitamin D level was 42.   She underwent a dual pacemaker generator changeout on 02/09/2017  after her battery reached ERI. She has had an evaluation for reflux related laryngitis/cough by Dr. Constance [name]. She had remotely been on allergy shots but stopped in early 2018.  I saw her in October 2018, she has done well and has been undergoing every 23-monthpacemaker checks.   Unfortunately, she had broken her second metatarsal of her right foot in November 2019 and required wearing a boot for some time.  Her ambulation has been somewhat difficult since and she does use a cane for ambulation.  She has difficulty walking, and when doing so admits to leg fatigue and some hip discomfort.  She denies any exertional shortness of breath or chest tightness.  She is unaware of recent palpitations on her current therapy which includes diltiazem 180 mg, metoprolol succinate 25 mg daily and in addition for blood pressure control she also is on valsartan HCT 160/25 mg at times she does admit to some occasional ankle swelling at the end of the day but this is normalized in the morning.  She admits to sleeping well.  She is unaware of any recurrent episodes of atrial fibrillation.  She continues to be on pravastatin for hyperlipidemia.  I reviewed the medical records of Dr. ALetitia [name]  When she  saw Dr. AElyse Hsuher blood pressure on July 03, 2018 was 118/50.  Her LDL cholesterol at that time was 85 in December 2019 which had slightly improved from 3 months previously when her LDL was 54.   I last evaluated her with a telemedicine visit on September 22, 2018.  At that time she was on valsartan HCT 160/25, diltiazem CD 1 8 mg and metoprolol succinate 25 mg.  Her blood pressure was elevated and I had suggested that she continue to monitor her blood pressure and that if additional blood pressure corneas were consistently elevated medication adjustment were necessary.  Over the  past several months, she has continued to do well.  She was evaluated by Dr. Sallyanne Kuster in January 26, 2019 and her pacemaker was stable but she is pacemaker dependent and did not have any underlying escape rhythm.  Blood pressure was slightly elevated at 146/65.  Presently, she denies any exertional chest pain or shortness of breath.  She does experience some mild left-sided nonexertional chest discomfort.  She admits to some fatigue.   She typically goes to bed at 1130 and wakes up at 6 AM.  She believes she is sleeping well.  She does have GERD and is scheduled to see Dr. Watt [name]  She presents for evaluation.   Past Medical History:  Diagnosis Date  . Allergic rhinitis   . Asthmatic bronchitis   . Chronic cough   . Esophageal reflux   . GERD (gastroesophageal reflux disease)   . History of stress test 11/20/2009   This was essentially normal,post-stress EF was hyperdynamic at 73 beats per mins, There was no scar or ischemia.  [name] Hx of echocardiogram 11/05/2010   EF>55% showed a normal systolic function with impaired diastolic dysfuntion and also tissue doppler suggesting increased elevated left atrial pressure, she had moderate LA dilatation and mild LA dilatation. There was calcified mitral apparatus with moderate MR, mild to moderate TR and trace aortic insufficiency.  . Hyperlipemia   . Hypertension   . PAF (paroxysmal atrial fibrillation) ([place])     Past Surgical History:  Procedure Laterality Date  . CHOLECYSTECTOMY    . FOOT SURGERY     right  . NASAL SEPTUM SURGERY    . PACEMAKER INSERTION  June 1st 2009   [employer] Jude [place] device, model #5826, serial #[id number], The artial lead is a Interior and spatial designer, the ventricular lead is Environmental education officer.  [name] PPM GENERATOR CHANGEOUT N/A 02/09/2017   Procedure: PPM GENERATOR CHANGEOUT;  Surgeon: [name] Klein, MD;  Location: [facility];  Service: Cardiovascular;  Laterality: N/A;  . THYROID SURGERY    . TONSILLECTOMY    . VESICOVAGINAL FISTULA CLOSURE W/ TAH      Current Medications: Outpatient Medications Prior to Visit  Medication Sig Dispense Refill  . Ascorbic Acid (VITAMIN C PO) Take 2 tablets by mouth daily.    [name] aspirin 81 MG tablet Take 81 mg by mouth at bedtime.     . Cholecalciferol (VITAMIN D-1000 MAX ST) 25 MCG (1000 UT) tablet Take 1 tablet once a day    . cromolyn (OPTICROM) 4 % ophthalmic solution INSTILL 1 DROP INTO BOTH EYES 4 TIMES A DAY    .  diltiazem (CARDIZEM CD) 180 MG 24 hr capsule TAKE 1 CAPSULE BY MOUTH EVERY DAY 30 capsule 4  . LORazepam (ATIVAN) 0.5 MG tablet Take 0.5 mg by mouth daily as needed for anxiety.     . metoprolol succinate (TOPROL-XL) 25 MG 24 hr tablet TAKE 1 TABLET BY MOUTH EVERY DAY 30 tablet 11  . pantoprazole (PROTONIX) 40 MG tablet Take 40 mg by mouth daily.    . pravastatin (PRAVACHOL) 40 MG tablet TAKE 1 TABLET BY MOUTH EVERY DAY 30 tablet 11  . Simethicone (GAS-X PO) Take 1 tablet by mouth daily as needed (gas).    . SYNTHROID 25 MCG tablet Take 25 mcg by mouth daily before breakfast.   12  . valsartan-hydrochlorothiazide (DIOVAN-HCT) 160-25 MG tablet Take 1 tablet by mouth daily. 30 tablet 6   No facility-administered medications prior to visit.      Allergies:  Fluticasone-salmeterol, Sulfonamide derivatives, Benzonatate, Erythromycin, Meloxicam, and Sulfamethoxazole   Social History   Socioeconomic History  . Marital status: Married    Spouse name: Not on file  . Number of children: Not on file  . Years of education: Not on file  . Highest education level: Not on file  Occupational History    Employer: RETIRED    Comment: Retired Sales promotion account executive at [employer]  . Financial resource strain: Not on file  . Food insecurity    Worry: Not on file    Inability: Not on file  . Transportation needs    Medical: Not on file    Non-medical: Not on file  Tobacco Use  . Smoking status: Never Smoker  . Smokeless tobacco: Never Used  Substance and Sexual Activity  . Alcohol use: No  . Drug use: No  . Sexual activity: Not on file  Lifestyle  . Physical activity    Days per week: Not on file    Minutes per session: Not on file  . Stress: Not on file  Relationships  . Social Herbalist on phone: Not on file    Gets together: Not on file    Attends religious service: Not on file    Active member of club or organization: Not on file    Attends meetings of  clubs or organizations: Not on file    Relationship status: Not on file  Other Topics Concern  . Not on file  Social History Narrative  . Not on file     Family History:  The patient's family history includes Cancer in her father; Stroke in her mother and sister.   ROS General: Negative; No fevers, chills, or night sweats; patient will fatigue HEENT: Negative; No changes in vision or hearing, sinus congestion, difficulty swallowing Pulmonary: Negative; No cough, wheezing, shortness of breath, hemoptysis Cardiovascular: Occasional mild nonexertional left-sided chest sensation GI: Negative; No nausea, vomiting, diarrhea, or abdominal pain GU: Negative; No dysuria, hematuria, or difficulty voiding Musculoskeletal: Negative; no myalgias, joint pain, or weakness Hematologic/Oncology: Negative; no easy bruising, bleeding Endocrine: Negative; no heat/cold intolerance; no diabetes Neuro: Negative; no changes in balance, headaches Skin: Negative; No rashes or skin lesions Psychiatric: Negative; No behavioral problems, depression Sleep: Negative; No snoring, daytime sleepiness, hypersomnolence, bruxism, restless legs, hypnogognic hallucinations, no cataplexy Other comprehensive 14 point system review is negative.   PHYSICAL EXAM:   VS:  BP (!) 142/58   Pulse 70   Ht 4' 10"  (1.473 m)   Wt 161 lb 9.6 oz (73.3 kg)   BMI 33.77 kg/m     Repeat blood pressure by me 135/60  Wt Readings from Last 3 Encounters:  03/06/19 161 lb 9.6 oz (73.3 kg)  01/26/19 161 lb (73 kg)  06/23/18 158 lb (71.7 kg)    General: Alert, oriented, no distress.  Skin: normal turgor, no rashes, warm and dry HEENT: Normocephalic, atraumatic. Pupils equal round and reactive to light; sclera anicteric; extraocular muscles intact;  Nose without nasal septal hypertrophy Mouth/Parynx benign; Mallinpatti scale 3 Neck: No JVD, no carotid bruits; normal carotid upstroke Lungs: clear to ausculatation and percussion; no  wheezing or rales Chest wall: Left costochondral chest wall tenderness to palpation Heart: PMI not displaced, RRR, s1 s2 normal, 1/6 systolic murmur, no diastolic murmur, no rubs, gallops, thrills, or heaves Abdomen: soft, nontender; no hepatosplenomehaly, BS+; abdominal aorta nontender and not dilated by palpation. Back: no CVA tenderness Pulses 2+ Musculoskeletal: full range  of motion, normal strength, no joint deformities Extremities: no clubbing cyanosis or edema, Homan's sign negative  Neurologic: grossly nonfocal; Cranial nerves grossly wnl Psychologic: Normal mood and affect   Studies/Labs Reviewed:   EKG:  EKG is ordered today.  ECG (independently read by me): AV paced rhythm at 70 bpm.  Prolonged AV conduction with PR interval at 222 ms.  October 2018 ECG (independently read by me): AV paced rhythm with a ventricular rate at 70 bpm.  PR interval 224 ms.  QTc interval 477 ms.  March 2018 ECG (independently read by me): AV paced rhythm at 70 bpm with prolonged AV conduction with a PR interval of 222 ms.  QTc interval 490 ms.  October 2017 ECG (independently read by me): AV paced rhythm with 100% pacing of both chambers.  PR interval 226 ms.    December 2016 ECG (independently read by me):  100% AV pacing with ventricular rate at 70 and prolonged AV conduction at 224 ms.  June 2016  ECG (independently read by me): AV sequentially paced rhythm with 100% capture.  Ventricular rate 70 beats per minute.  PR interval 224 ms.  August 2014 ECG: 80 paced rhythm at 70 beats per minute. PR interval 222 ms.   Recent Labs: BMP Latest Ref Rng & Units 02/04/2017 10/16/2014 10/15/2014  Glucose 65 - 99 mg/dL 120(H) 120(H) 107(H)  BUN 8 - 27 mg/dL 18 18 20   Creatinine 0.57 - 1.00 mg/dL 1.17(H) 1.14(H) 0.83  BUN/Creat Ratio 12 - 28 15 - -  Sodium 134 - 144 mmol/L 138 136 136  Potassium 3.5 - 5.2 mmol/L 4.1 3.7 3.9  Chloride 96 - 106 mmol/L 99 99 99  CO2 20 - 29 mmol/L 24 28 28   Calcium  8.7 - 10.3 mg/dL 9.0 9.0 8.9     Hepatic Function Latest Ref Rng & Units 10/16/2014 11/28/2007 11/24/2007  Total Protein 6.0 - 8.3 g/dL 6.6 5.9(L) 5.8(L)  Albumin 3.5 - 5.2 g/dL 3.6 2.6(L) 2.8(L)  AST 0 - 37 U/L 24 21 77(H)  ALT 0 - 35 U/L 21 29 85(H)  Alk Phosphatase 39 - 117 U/L 72 61 75  Total Bilirubin 0.3 - 1.2 mg/dL 0.3 0.4 0.4    CBC Latest Ref Rng & Units 02/04/2017 10/16/2014 11/28/2007  WBC 3.4 - 10.8 x10E3/uL 6.0 7.3 8.7  Hemoglobin 11.1 - 15.9 g/dL 11.4 11.0(L) 9.7(L)  Hematocrit 34.0 - 46.6 % 34.4 33.3(L) 28.5(L)  Platelets 150 - 379 x10E3/uL 222 216 183   Lab Results  Component Value Date   MCV 90 02/04/2017   MCV 91.0 10/16/2014   MCV 91.2 11/28/2007   Lab Results  Component Value Date   TSH 3.166 Test methodology is 3rd generation TSH 11/24/2007   No results found for: HGBA1C   BNP No results found for: BNP  ProBNP    Component Value Date/Time   PROBNP 101.0 (H) 09/09/2014 1423     Lipid Panel     Component Value Date/Time   CHOL 136 03/13/2015 1436   TRIG 95 03/13/2015 1436   HDL 57 03/13/2015 1436   CHOLHDL 2.4 03/13/2015 1436   VLDL 19 03/13/2015 1436   LDLCALC 60 03/13/2015 1436     RADIOLOGY: No results found.    ASSESSMENT:    1. Essential hypertension   2. Pacemaker   3. CHB (complete heart block) (HCC)   4. Atypical chest pain   5. Paroxysmal atrial fibrillation (HCC)   6. Pure hypercholesterolemia   7. Gastroesophageal  reflux disease without esophagitis      PLAN:  1.  Essential hypertension: Blood pressure today is improved at 135/60 when rechecked by me on her current medical regimen consisting of diltiazem CD 180 mg, metoprolol succinate 25 mg daily and valsartan HCT 160/25 mg.  2.  Underlying complete heart block/status post permanent pacemaker: Initial pacemaker insertion June 2009 with generator change out August 2018.  Currently pacemaker dependent.  Reviewed last follow-up from January 26, 2019 with Dr. Sallyanne Kuster.  3.  Atypical chest pain: She has mild chest wall tenderness to palpation over the left costochondral region.  I suspect this is musculoskeletal in etiology  4. Hyperlipidemia: She continues to be on pravastatin 40 mg daily.  She will be undergoing laboratory with Dr. Elyse [name]  5.  History of PAF: Maintaining sinus rhythm  6.  GERD: She is scheduled to be evaluated by Dr. Watt [name]   Medication Adjustments/Labs and Tests Ordered: Current medicines are reviewed at length with the patient today.  Concerns regarding medicines are outlined above.  Medication changes, Labs and Tests ordered today are listed in the Patient Instructions below. Patient Instructions  Follow-Up: You will need a follow up appointment in 6 months.  Please call our office 3 months in advance, December 2020 to schedule this, march 2021  appointment.  You may see [name] Majestic, MD or one of the following Advanced Practice Providers on your designated Care Team: Almyra Deforest, PA-C Fabian Sharp, [place]     Medication Instructions:  The current medical regimen is effective;  continue present plan and medications as directed. Please refer to the Current Medication list given to you today. If you need a refill on your cardiac medications before your next appointment, please call your pharmacy. Labwork: When you have labs (blood work) and your tests are completely normal, you will receive your results ONLY by [place] (if you have MyChart) -OR- A paper copy in the mail.  At [facility], you and your health needs are our priority.  As part of our continuing mission to provide you with exceptional heart care, we have created designated Provider Care Teams.  These Care Teams include your primary Cardiologist (physician) and Advanced Practice Providers (APPs -  Physician Assistants and Nurse Practitioners) who all work together to provide you with the care you need, when you need it.  Thank you for choosing [facility] at [facility]!!           Signed, [name] Majestic, MD  03/08/2019 6:34 PM    [facility] [address], [place], [place], [place]  [address] Phone: [phone]

## 2019-03-06 NOTE — Patient Instructions (Signed)
Follow-Up: You will need a follow up appointment in 6 months.  Please call our office 3 months in advance, December 2020 to schedule this, march 2021  appointment.  You may see Shelva Majestic, MD or one of the following Advanced Practice Providers on your designated Care Team: Almyra Deforest, PA-C Fabian Sharp, [place]     Medication Instructions:  The current medical regimen is effective;  continue present plan and medications as directed. Please refer to the Current Medication list given to you today. If you need a refill on your cardiac medications before your next appointment, please call your pharmacy. Labwork: When you have labs (blood work) and your tests are completely normal, you will receive your results ONLY by [place] (if you have MyChart) -OR- A paper copy in the mail.  At [facility], you and your health needs are our priority.  As part of our continuing mission to provide you with exceptional heart care, we have created designated Provider Care Teams.  These Care Teams include your primary Cardiologist (physician) and Advanced Practice Providers (APPs -  Physician Assistants and Nurse Practitioners) who all work together to provide you with the care you need, when you need it.  Thank you for choosing [facility] at [facility]!!

## 2019-03-08 ENCOUNTER — Encounter: Payer: Self-pay | Admitting: Cardiovascular Disease

## 2019-03-14 ENCOUNTER — Ambulatory Visit (INDEPENDENT_AMBULATORY_CARE_PROVIDER_SITE_OTHER): Payer: Medicare Other | Admitting: *Deleted

## 2019-03-14 DIAGNOSIS — I442 Atrioventricular block, complete: Secondary | ICD-10-CM

## 2019-03-14 LAB — CUP PACEART REMOTE DEVICE CHECK
Battery Remaining Longevity: 121 mo
Battery Remaining Percentage: 95.5 %
Battery Voltage: 3.01 V
Brady Statistic AP VP Percent: 97 %
Brady Statistic AP VS Percent: 1 %
Brady Statistic AS VP Percent: 2.8 %
Brady Statistic AS VS Percent: 1 %
Brady Statistic RA Percent Paced: 97 %
Brady Statistic RV Percent Paced: 99 %
Date Time Interrogation Session: 20200923071129
Implantable Lead Implant Date: 20090601
Implantable Lead Implant Date: 20090605
Implantable Lead Location: 753859
Implantable Lead Location: 753860
Implantable Pulse Generator Implant Date: 20180822
Lead Channel Impedance Value: 430 Ohm
Lead Channel Impedance Value: 680 Ohm
Lead Channel Pacing Threshold Amplitude: 0.375 V
Lead Channel Pacing Threshold Amplitude: 0.75 V
Lead Channel Pacing Threshold Pulse Width: 0.4 ms
Lead Channel Pacing Threshold Pulse Width: 0.4 ms
Lead Channel Sensing Intrinsic Amplitude: 4.3 mV
Lead Channel Setting Pacing Amplitude: 1 V
Lead Channel Setting Pacing Amplitude: 1.375
Lead Channel Setting Pacing Pulse Width: 0.4 ms
Lead Channel Setting Sensing Sensitivity: 4 mV
Pulse Gen Model: 2272
Pulse Gen Serial Number: 8932617

## 2019-03-20 ENCOUNTER — Encounter: Payer: Self-pay | Admitting: Cardiology

## 2019-03-20 NOTE — Progress Notes (Signed)
Remote pacemaker transmission.   

## 2019-06-13 ENCOUNTER — Ambulatory Visit (INDEPENDENT_AMBULATORY_CARE_PROVIDER_SITE_OTHER): Payer: Medicare Other | Admitting: *Deleted

## 2019-06-13 DIAGNOSIS — I442 Atrioventricular block, complete: Secondary | ICD-10-CM

## 2019-06-14 LAB — CUP PACEART REMOTE DEVICE CHECK
Battery Remaining Longevity: 122 mo
Battery Remaining Percentage: 95.5 %
Battery Voltage: 3.01 V
Brady Statistic AP VP Percent: 97 %
Brady Statistic AP VS Percent: 1 %
Brady Statistic AS VP Percent: 2.8 %
Brady Statistic AS VS Percent: 1 %
Brady Statistic RA Percent Paced: 97 %
Brady Statistic RV Percent Paced: 99 %
Date Time Interrogation Session: 20201223020005
Implantable Lead Implant Date: 20090601
Implantable Lead Implant Date: 20090605
Implantable Lead Location: 753859
Implantable Lead Location: 753860
Implantable Pulse Generator Implant Date: 20180822
Lead Channel Impedance Value: 430 Ohm
Lead Channel Impedance Value: 730 Ohm
Lead Channel Pacing Threshold Amplitude: 0.375 V
Lead Channel Pacing Threshold Amplitude: 1 V
Lead Channel Pacing Threshold Pulse Width: 0.4 ms
Lead Channel Pacing Threshold Pulse Width: 0.4 ms
Lead Channel Sensing Intrinsic Amplitude: 5 mV
Lead Channel Setting Pacing Amplitude: 1.25 V
Lead Channel Setting Pacing Amplitude: 1.375
Lead Channel Setting Pacing Pulse Width: 0.4 ms
Lead Channel Setting Sensing Sensitivity: 4 mV
Pulse Gen Model: 2272
Pulse Gen Serial Number: 8932617

## 2019-07-03 ENCOUNTER — Other Ambulatory Visit: Payer: Self-pay | Admitting: Cardiovascular Disease

## 2019-07-03 MED ORDER — METOPROLOL SUCCINATE ER 25 MG PO TB24: 25.0000 mg | ORAL_TABLET | Freq: Every day | ORAL | 11 refills | Status: DC

## 2019-07-03 NOTE — Telephone Encounter (Signed)
New Message      *STAT* If patient is at the pharmacy, call can be transferred to refill team.   1. Which medications need to be refilled? (please list name of each medication and dose if known) metoprolol succinate (TOPROL-XL) 25 MG 24 hr tablet  2. Which pharmacy/location (including street and city if local pharmacy) is medication to be sent to? [facility] [place], [place] - [address]  3. Do they need a 30 day or 90 day supply? [place]

## 2019-07-08 ENCOUNTER — Other Ambulatory Visit: Payer: Self-pay | Admitting: Cardiovascular Disease

## 2019-07-27 ENCOUNTER — Other Ambulatory Visit: Payer: Self-pay

## 2019-07-27 ENCOUNTER — Other Ambulatory Visit: Payer: Self-pay | Admitting: Cardiovascular Disease

## 2019-07-27 MED ORDER — DILTIAZEM HCL ER COATED BEADS 180 MG PO CP24: ORAL_CAPSULE | ORAL | 3 refills | Status: DC

## 2019-07-27 NOTE — Telephone Encounter (Signed)
*  STAT* If patient is at the pharmacy, call can be transferred to refill team.   1. Which medications need to be refilled? (please list name of each medication and dose if known)  diltiazem (CARDIZEM CD) 180 MG 24 hr capsule  2. Which pharmacy/location (including street and city if local pharmacy) is medication to be sent to? [facility] - [place], [place] - [address] Melina Modena  3. Do they need a 30 day or 90 day supply? 30 day supply   Patient only has 4 tablets left

## 2019-07-27 NOTE — Telephone Encounter (Signed)
Refill needs to go to [facility] [place], [place]. Please advise.

## 2019-07-30 ENCOUNTER — Ambulatory Visit (INDEPENDENT_AMBULATORY_CARE_PROVIDER_SITE_OTHER): Payer: Medicare Other | Admitting: Cardiovascular Disease

## 2019-07-30 ENCOUNTER — Encounter: Payer: Self-pay | Admitting: Cardiovascular Disease

## 2019-07-30 ENCOUNTER — Other Ambulatory Visit: Payer: Self-pay

## 2019-07-30 VITALS — BP 136/54 | HR 74 | Ht <= 58 in | Wt 164.0 lb

## 2019-07-30 DIAGNOSIS — E7800 Pure hypercholesterolemia, unspecified: Secondary | ICD-10-CM

## 2019-07-30 DIAGNOSIS — I10 Essential (primary) hypertension: Secondary | ICD-10-CM

## 2019-07-30 DIAGNOSIS — I4719 Other supraventricular tachycardia: Secondary | ICD-10-CM

## 2019-07-30 DIAGNOSIS — I442 Atrioventricular block, complete: Secondary | ICD-10-CM

## 2019-07-30 DIAGNOSIS — I471 Supraventricular tachycardia: Secondary | ICD-10-CM | POA: Diagnosis not present

## 2019-07-30 DIAGNOSIS — I495 Sick sinus syndrome: Secondary | ICD-10-CM

## 2019-07-30 DIAGNOSIS — Z950 Presence of cardiac pacemaker: Secondary | ICD-10-CM

## 2019-07-30 DIAGNOSIS — I480 Paroxysmal atrial fibrillation: Secondary | ICD-10-CM | POA: Diagnosis not present

## 2019-07-30 NOTE — Progress Notes (Signed)
Patient ID: [name], female   DOB: January 24, 1932, 86 y.o.   MRN: [id number] Patient ID: [name], female   DOB: 02/24/1932, 86 y.o.   MRN: [id number]    Cardiology Office Note    Date:  07/30/2019   ID:  [name], DOB Dec 12, 1931, MRN [id number]  PCP:  Lorne Skeens, MD  Cardiologist: Shelva Majestic, M.D.;  Sanda Klein, MD   No chief complaint on file.   History of Present Illness:  [name] is a 86 y.o. female who returns in follow-up for sinus node dysfunction, complete heart block, paroxysmal atrial .fibrillation and pacemaker check.    Her initial pacemaker was implanted for complete heart block in 2009.  She underwent pacemaker change out for ERI in August 2018 (St. Jude Assurity MRI, but leads are not MRI conditional).  She has developed symptoms of sinus node dysfunction as well and has almost 100% atrial pacing.  Rate response sensor was turned on with some improvement in her symptoms of fatigue but she still feels that she has some difficulty with household chores.  After we have made her rate response settings more aggressive at her last appointment, she initially felt that she had more energy, but at this point she is not sure that it really made a difference.  She is limited mostly by bilateral hip pain and is considering hip surgery.  Device function is normal.  She has an estimated generator longevity of about 10 years.  Similar to her last appointment she has 97% atrial pacing and 100% ventricular pacing.  Underlying rhythm today was sinus at about 60 bpm with complete heart block. There have not been any interim episodes of atrial fibrillation or high ventricular rates.  Her heart rate histograms are reasonably distributed considering the fact that she is fairly sedentary.  Lead parameters remain excellent.  Reprogrammed the device today with hysteresis at 65 bpm and a sleep rate at 50 bpm.  The patient specifically denies any chest pain at rest exertion, dyspnea  at rest or with exertion, orthopnea, paroxysmal nocturnal dyspnea, syncope, palpitations, focal neurological deficits, intermittent claudication, lower extremity edema, unexplained weight gain, cough, hemoptysis or wheezing.   Past Medical History:  Diagnosis Date  . Allergic rhinitis   . Asthmatic bronchitis   . Chronic cough   . Esophageal reflux   . GERD (gastroesophageal reflux disease)   . History of stress test 11/20/2009   This was essentially normal,post-stress EF was hyperdynamic at 73 beats per mins, There was no scar or ischemia.  Marland Kitchen Hx of echocardiogram 11/05/2010   EF>55% showed a normal systolic function with impaired diastolic dysfuntion and also tissue doppler suggesting increased elevated left atrial pressure, she had moderate LA dilatation and mild LA dilatation. There was calcified mitral apparatus with moderate MR, mild to moderate TR and trace aortic insufficiency.  . Hyperlipemia   . Hypertension   . PAF (paroxysmal atrial fibrillation) ([facility])     Past Surgical History:  Procedure Laterality Date  . CHOLECYSTECTOMY    . FOOT SURGERY     right  . NASAL SEPTUM SURGERY    . PACEMAKER INSERTION  June 1st 2009   St Jude Darwin device, model #5826, serial [id number], The artial lead is a Interior and spatial designer, the ventricular lead is Environmental education officer.  Marland Kitchen PPM GENERATOR CHANGEOUT N/A 02/09/2017   Procedure: PPM GENERATOR CHANGEOUT;  Surgeon: Sanda Klein, MD;  Location: [facility];  Service: Cardiovascular;  Laterality: N/A;  . THYROID SURGERY    .  TONSILLECTOMY    . VESICOVAGINAL FISTULA CLOSURE W/ TAH      Current Outpatient Medications  Medication Sig Dispense Refill  . Ascorbic Acid (VITAMIN C PO) Take 2 tablets by mouth daily.    Marland Kitchen aspirin 81 MG tablet Take 81 mg by mouth at bedtime.     . Cholecalciferol (VITAMIN D-1000 MAX ST) 25 MCG (1000 UT) tablet Take 1 tablet once a day    . cromolyn (OPTICROM) 4 % ophthalmic solution INSTILL 1 DROP INTO BOTH EYES 4 TIMES  A DAY    . diltiazem (CARDIZEM CD) 180 MG 24 hr capsule TAKE 1 CAPSULE BY MOUTH EVERY DAY 90 capsule 3  . LORazepam (ATIVAN) 0.5 MG tablet Take 0.5 mg by mouth daily as needed for anxiety.     . metoprolol succinate (TOPROL-XL) 25 MG 24 hr tablet Take 1 tablet (25 mg total) by mouth daily. 30 tablet 11  . pantoprazole (PROTONIX) 40 MG tablet Take 40 mg by mouth daily.    . pravastatin (PRAVACHOL) 40 MG tablet Take 1 tablet (40 mg total) by mouth daily. 90 tablet 1  . Simethicone (GAS-X PO) Take 1 tablet by mouth daily as needed (gas).    . SYNTHROID 25 MCG tablet Take 25 mcg by mouth daily before breakfast.   12  . valsartan-hydrochlorothiazide (DIOVAN-HCT) 160-25 MG tablet Take 1 tablet by mouth daily. 30 tablet 6   No current facility-administered medications for this visit.    Allergies:   Fluticasone-salmeterol, Sulfonamide derivatives, Benzonatate, Erythromycin, Meloxicam, and Sulfamethoxazole   Social History   Socioeconomic History  . Marital status: Married    Spouse name: Not on file  . Number of children: Not on file  . Years of education: Not on file  . Highest education level: Not on file  Occupational History    Employer: RETIRED    Comment: Retired Sales promotion account executive at [employer]  . Smoking status: Never Smoker  . Smokeless tobacco: Never Used  Substance and Sexual Activity  . Alcohol use: No  . Drug use: No  . Sexual activity: Not on file  Other Topics Concern  . Not on file  Social History Narrative  . Not on file   Social Determinants of Health   Financial Resource Strain:   . Difficulty of Paying Living Expenses: Not on file  Food Insecurity:   . Worried About Charity fundraiser in the Last Year: Not on file  . Ran Out of Food in the Last Year: Not on file  Transportation Needs:   . Lack of Transportation (Medical): Not on file  . Lack of Transportation (Non-Medical): Not on file  Physical Activity:   . Days of Exercise per  Week: Not on file  . Minutes of Exercise per Session: Not on file  Stress:   . Feeling of Stress : Not on file  Social Connections:   . Frequency of Communication with Friends and Family: Not on file  . Frequency of Social Gatherings with Friends and Family: Not on file  . Attends Religious Services: Not on file  . Active Member of Clubs or Organizations: Not on file  . Attends Archivist Meetings: Not on file  . Marital Status: Not on file     Family History:  The patient's family history includes Cancer in her father; Stroke in her mother and sister.   ROS:   Please see the history of present illness.    Review of Systems  Cardiovascular: Positive for dyspnea on exertion.  Musculoskeletal: Positive for arthritis, back pain, muscle cramps and stiffness.   All other systems reviewed and are negative.   PHYSICAL EXAM:   VS:  BP (!) 136/54   Pulse 74   Ht 4\' 10"  (1.473 m)   Wt 164 lb (74.4 kg)   SpO2 98%   BMI 34.28 kg/m      General: Alert, oriented x3, no distress, moderately obese.  Healthy right subclavian pacemaker site Head: no evidence of trauma, PERRL, EOMI, no exophtalmos or lid lag, no myxedema, no xanthelasma; normal ears, nose and oropharynx Neck: normal jugular venous pulsations and no hepatojugular reflux; brisk carotid pulses without delay and no carotid bruits Chest: clear to auscultation, no signs of consolidation by percussion or palpation, normal fremitus, symmetrical and full respiratory excursions Cardiovascular: normal position and quality of the apical impulse, regular rhythm, normal first and paradoxically split second heart sounds, no murmurs, rubs or gallops Abdomen: no tenderness or distention, no masses by palpation, no abnormal pulsatility or arterial bruits, normal bowel sounds, no hepatosplenomegaly Extremities: no clubbing, cyanosis or edema; 2+ radial, ulnar and brachial pulses bilaterally; 2+ right femoral, posterior tibial and  dorsalis pedis pulses; 2+ left femoral, posterior tibial and dorsalis pedis pulses; no subclavian or femoral bruits Neurological: grossly nonfocal Psych: Normal mood and affect    Wt Readings from Last 3 Encounters:  07/30/19 164 lb (74.4 kg)  03/06/19 161 lb 9.6 oz (73.3 kg)  01/26/19 161 lb (73 kg)      Studies/Labs Reviewed:   EKG:  EKG is ordered today and shows atrial paced, ventricular paced rhythm, QTC 495 ms.  The intracardiac electrogram shows the underlying rhythm is sinus at about 60 bpm, with complete heart block Recent Labs: No results found for requested labs within last 8760 hours.  04/04/2019 Hemoglobin A1c 5.9% Creatinine 0.7, potassium 4.5, ALT 13.0 Hemoglobin 12.5  Lipid Panel    Component Value Date/Time   CHOL 136 03/13/2015 1436   TRIG 95 03/13/2015 1436   HDL 57 03/13/2015 1436   CHOLHDL 2.4 03/13/2015 1436   VLDL 19 03/13/2015 1436   LDLCALC 60 03/13/2015 1436    04/04/2019 Total cholesterol 194, HDL 46, LDL 130, triglycerides 95 Hemoglobin A1c 5.9% Creatinine 0.7, potassium 4.5, ALT 13.0 Hemoglobin 12.5 ASSESSMENT:    1. CHB (complete heart block) ([facility])   2. SSS (sick sinus syndrome) ([facility])   3. Pacemaker   4. PAT (paroxysmal atrial tachycardia) ([facility])   5. Paroxysmal atrial fibrillation ([facility])   6. Essential hypertension   7. Hypercholesteremia      PLAN:  In order of problems listed above:  1. CHB: Pacemaker dependent. No underlying escape rhythm. 2. SSS: Still has symptoms suggestive of chronotropic incompetence.  Reprogrammed as below.  However, I wonder whether her fatigue is more of an expression of obstructive sleep apnea. 3. PPM: normal device function.  Increased the rate response slope from 9-10.  Already programmed with low threshold and fast reaction time.  Download in 3 months. 4. PAT: Very few events recently.  On diltiazem and metoprolol succinate, both in the low-dose range. 5. AFib: This has not been recorded by her  pacemaker in a long time (at least since 2012 when I started monitoring her device).  She is only receiving aspirin and is not on anticoagulation.  6. HTN: Control is good most of the time although her blood pressure is a little high today.  I asked her to start taking  her metoprolol in the evenings to see if this helps with her fatigue and weakness in the mid morning. 7. HLP: LDL is high at 130 (I wonder if she was off her medicine in October when the labs were drawn)  Medication Adjustments/Labs and Tests Ordered: Current medicines are reviewed at length with the patient today.  Concerns regarding medicines are outlined above.  Medication changes, Labs and Tests ordered today are listed below. Patient Instructions  Medication Instructions:  No changes *If you need a refill on your cardiac medications before your next appointment, please call your pharmacy*  Lab Work: None ordered If you have labs (blood work) drawn today and your tests are completely normal, you will receive your results only by: Marland Kitchen MyChart Message (if you have MyChart) OR . A paper copy in the mail If you have any lab test that is abnormal or we need to change your treatment, we will call you to review the results.  Testing/Procedures: None ordered  Follow-Up: At [facility] you and your health needs are our priority.  As part of our continuing mission to provide you with exceptional heart care, we have created designated Provider Care Teams.  These Care Teams include your primary Cardiologist (physician) and Advanced Practice Providers (APPs -  Physician Assistants and Nurse Practitioners) who all work together to provide you with the care you need, when you need it.  Your next appointment:   12 month(s)  The format for your next appointment:   In Person  Provider:   Sanda Klein, MD       Patient Instructions  Medication Instructions:  No changes *If you need a refill on your cardiac medications  before your next appointment, please call your pharmacy*  Lab Work: None ordered If you have labs (blood work) drawn today and your tests are completely normal, you will receive your results only by: Marland Kitchen MyChart Message (if you have MyChart) OR . A paper copy in the mail If you have any lab test that is abnormal or we need to change your treatment, we will call you to review the results.  Testing/Procedures: None ordered  Follow-Up: At [facility], you and your health needs are our priority.  As part of our continuing mission to provide you with exceptional heart care, we have created designated Provider Care Teams.  These Care Teams include your primary Cardiologist (physician) and Advanced Practice Providers (APPs -  Physician Assistants and Nurse Practitioners) who all work together to provide you with the care you need, when you need it.  Your next appointment:   12 month(s)  The format for your next appointment:   In Person  Provider:   Sanda Klein, MD      Signed, Sanda Klein, MD  07/30/2019 3:45 PM    [address] [place], [place],   [address] Phone: [phone]; Fax: [phone]

## 2019-07-30 NOTE — Patient Instructions (Signed)

## 2019-08-07 ENCOUNTER — Other Ambulatory Visit: Payer: Self-pay | Admitting: Cardiovascular Disease

## 2019-08-25 ENCOUNTER — Other Ambulatory Visit: Payer: Self-pay | Admitting: Cardiovascular Disease

## 2019-09-06 ENCOUNTER — Ambulatory Visit (INDEPENDENT_AMBULATORY_CARE_PROVIDER_SITE_OTHER): Payer: Medicare Other | Admitting: Cardiovascular Disease

## 2019-09-06 ENCOUNTER — Other Ambulatory Visit: Payer: Self-pay

## 2019-09-06 ENCOUNTER — Ambulatory Visit (INDEPENDENT_AMBULATORY_CARE_PROVIDER_SITE_OTHER): Payer: Medicare Other | Admitting: Podiatry

## 2019-09-06 ENCOUNTER — Encounter: Payer: Self-pay | Admitting: Cardiovascular Disease

## 2019-09-06 VITALS — Temp 97.2°F

## 2019-09-06 DIAGNOSIS — M79671 Pain in right foot: Secondary | ICD-10-CM

## 2019-09-06 DIAGNOSIS — B351 Tinea unguium: Secondary | ICD-10-CM | POA: Diagnosis not present

## 2019-09-06 DIAGNOSIS — I495 Sick sinus syndrome: Secondary | ICD-10-CM | POA: Diagnosis not present

## 2019-09-06 DIAGNOSIS — I480 Paroxysmal atrial fibrillation: Secondary | ICD-10-CM

## 2019-09-06 DIAGNOSIS — Z950 Presence of cardiac pacemaker: Secondary | ICD-10-CM

## 2019-09-06 DIAGNOSIS — R600 Localized edema: Secondary | ICD-10-CM

## 2019-09-06 DIAGNOSIS — L600 Ingrowing nail: Secondary | ICD-10-CM

## 2019-09-06 DIAGNOSIS — I10 Essential (primary) hypertension: Secondary | ICD-10-CM | POA: Diagnosis not present

## 2019-09-06 DIAGNOSIS — M79674 Pain in right toe(s): Secondary | ICD-10-CM | POA: Diagnosis not present

## 2019-09-06 DIAGNOSIS — M79675 Pain in left toe(s): Secondary | ICD-10-CM | POA: Diagnosis not present

## 2019-09-06 DIAGNOSIS — K219 Gastro-esophageal reflux disease without esophagitis: Secondary | ICD-10-CM | POA: Diagnosis not present

## 2019-09-06 DIAGNOSIS — I442 Atrioventricular block, complete: Secondary | ICD-10-CM | POA: Diagnosis not present

## 2019-09-06 DIAGNOSIS — Q828 Other specified congenital malformations of skin: Secondary | ICD-10-CM

## 2019-09-06 MED ORDER — FUROSEMIDE 20 MG PO TABS: 20.0000 mg | ORAL_TABLET | ORAL | 2 refills | Status: DC | PRN

## 2019-09-06 NOTE — Patient Instructions (Signed)

## 2019-09-06 NOTE — Progress Notes (Signed)
Cardiology Office Note    Date:  09/08/2019   ID:  [name], DOB 1932/03/02, MRN [id number]  PCP:  Lorne Skeens, MD  Cardiologist:  Shelva Majestic, MD   6 month F/U evaluation  History of Present Illness:  [name] is a 86 y.o. female who underwent dual-chamber pacemaker implantation for complete heart block. She has a history of paroxysmal atrial fibrillation, a history of lower extremity edema as well as GERD. She also has a history of hypertension as well as hyperlipidemia. A follow-up echo Doppler study on 10/23/2014 revealed an ejection fraction in the 50-55% range with grade 1 diastolic dysfunction and mild mitral regurgitation. Her last pacemaker interrogation was in June 2017 by Dr. Sallyanne Kuster. At that time, she had normal pacemaker function and had 97% atrial pacing and 100% ventricular pacing. There was no underlying escape rhythm. Estimated longevity was 1.75-2.25 years.  Typically she goes to bed at midnight and wakes up at 6 AM. She experiences a dry cough and is unaware of any postnasal drip. She has a history of GERD. She denies recent wheezing. She denies presyncope or syncope. At times she admits to intermittent leg swelling. She has a history of hypothyroidism on thyroid replacement.   She sees Dr. Elyse Hsu for primary and endocrinologic care and saw him on 08/03/2016. He is now part of the [facility]. Her recent lipid study on pravastatin from February 2018 showed a total cholesterol 137, triglycerides 89, HDL 61, and LDL 69.; Hemoglobin A1c was 5.8. TSH was 3.2. Vitamin D level was 42.   She underwent a dual pacemaker generator changeout on 02/09/2017  after her battery reached ERI. She has had an evaluation for reflux related laryngitis/cough by Dr. Constance Holster. She had remotely been on allergy shots but stopped in early 2018.  I saw her in October 2018, she has done well and has been undergoing every 67-monthpacemaker checks.   Unfortunately, she had broken her second metatarsal of her right foot in November 2019 and required wearing a boot for some time.  Her ambulation has been somewhat difficult since and she does use a cane for ambulation.  She has difficulty walking, and when doing so admits to leg fatigue and some hip discomfort.  She denies any exertional shortness of breath or chest tightness.  She is unaware of recent palpitations on her current therapy which includes diltiazem 180 mg, metoprolol succinate 25 mg daily and in addition for blood pressure control she also is on valsartan HCT 160/25 mg at times she does admit to some occasional ankle swelling at the end of the day but this is normalized in the morning.  She admits to sleeping well.  She is unaware of any recurrent episodes of atrial fibrillation.  She continues to be on pravastatin for hyperlipidemia.  I reviewed the medical records of Dr. ALetitia Neri  When she  saw Dr. AElyse Hsuher blood pressure on July 03, 2018 was 118/50.  Her LDL cholesterol at that time was 85 in December 2019 which had slightly improved from 3 months previously when her LDL was 54.   I evaluated her with a telemedicine visit on September 22, 2018.  At that time she was on valsartan HCT 160/25, diltiazem CD 1 8 mg and metoprolol succinate 25 mg.  Her blood pressure was elevated and I had suggested that she continue to monitor her blood pressure and that if additional blood pressure corneas were consistently elevated medication adjustment were necessary.  She was evaluated by Dr. Sallyanne Kuster in January 26, 2019 and her pacemaker was stable but she is pacemaker dependent and did not have any underlying escape rhythm.  Blood pressure was slightly elevated at 146/65.    I last saw her in September 2020 at which time she denied any exertional chest pain or shortness of breath.  She did experience mild nonexertional left-sided discomfort and also admitted to some fatigue.  She typically was going to bed  around 11:30 AM.  She felt that she was sleeping well.  She has GERD and was scheduled to see Dr. May got in follow-up.  Since I last saw her, she saw Dr. Sallyanne Kuster on July 30, 2019.  She was pacemaker dependent without underlying escape rhythm.  She still had symptoms suggestive of chronotropic incompetence and her pacemaker was reprogrammed.  She had not had any recorded atrial fibrillation in years.  Since I last saw her, she has felt well.  She denies chest pain.  She admits to some intermittent leg swelling.  At times she notes some chest sensation nocturnally which sounded more GERD than ischemic.  She has continued to be on valsartan HCT 160/25 mg, Toprol-XL 25 mg and diltiazem 180 mg daily.  She had been taking pantoprazole in the morning.  She has hypothyroidism on levothyroxine.  She is continue to take pravastatin 40 mg daily.  She presents for reevaluation.   Past Medical History:  Diagnosis Date  . Allergic rhinitis   . Asthmatic bronchitis   . Chronic cough   . Esophageal reflux   . GERD (gastroesophageal reflux disease)   . History of stress test 11/20/2009   This was essentially normal,post-stress EF was hyperdynamic at 73 beats per mins, There was no scar or ischemia.  Marland Kitchen Hx of echocardiogram 11/05/2010   EF>55% showed a normal systolic function with impaired diastolic dysfuntion and also tissue doppler suggesting increased elevated left atrial pressure, she had moderate LA dilatation and mild LA dilatation. There was calcified mitral apparatus with moderate MR, mild to moderate TR and trace aortic insufficiency.  . Hyperlipemia   . Hypertension   . PAF (paroxysmal atrial fibrillation) ([place])     Past Surgical History:  Procedure Laterality Date  . CHOLECYSTECTOMY    . FOOT SURGERY     right  . NASAL SEPTUM SURGERY    . PACEMAKER INSERTION  June 1st 2009   St Jude Bethesda device, model #5826, serial #[id number], The artial lead is a Interior and spatial designer, the ventricular lead is Engineer, petroleum.  Marland Kitchen PPM GENERATOR CHANGEOUT N/A 02/09/2017   Procedure: PPM GENERATOR CHANGEOUT;  Surgeon: Sanda Klein, MD;  Location: [facility];  Service: Cardiovascular;  Laterality: N/A;  . THYROID SURGERY    . TONSILLECTOMY    . VESICOVAGINAL FISTULA CLOSURE W/ TAH      Current Medications: Outpatient Medications Prior to Visit  Medication Sig Dispense Refill  . Ascorbic Acid (VITAMIN C PO) Take 2 tablets by mouth daily.    Marland Kitchen aspirin 81 MG tablet Take 81 mg by mouth at bedtime.     . Cholecalciferol (VITAMIN D-1000 MAX ST) 25 MCG (1000 UT) tablet Take 1 tablet once a day    . cromolyn (OPTICROM) 4 % ophthalmic solution INSTILL 1 DROP INTO BOTH EYES 4 TIMES A DAY    . diltiazem (CARDIZEM CD) 180 MG 24 hr capsule TAKE 1 CAPSULE BY MOUTH EVERY DAY 90 capsule 3  . LORazepam (ATIVAN) 0.5 MG tablet  Take 0.5 mg by mouth daily as needed for anxiety.     . metoprolol succinate (TOPROL-XL) 25 MG 24 hr tablet Take 1 tablet (25 mg total) by mouth daily. 30 tablet 11  . pantoprazole (PROTONIX) 40 MG tablet Take 40 mg by mouth daily.    . pravastatin (PRAVACHOL) 40 MG tablet Take 1 tablet (40 mg total) by mouth daily. 90 tablet 1  . Simethicone (GAS-X PO) Take 1 tablet by mouth daily as needed (gas).    . SYNTHROID 25 MCG tablet Take 25 mcg by mouth daily before breakfast.   12  . valsartan-hydrochlorothiazide (DIOVAN-HCT) 160-25 MG tablet TAKE 1 TABLET BY MOUTH EVERY DAY 30 tablet 8   No facility-administered medications prior to visit.     Allergies:   Fluticasone-salmeterol, Sulfonamide derivatives, Benzonatate, Erythromycin, Meloxicam, and Sulfamethoxazole   Social History   Socioeconomic History  . Marital status: Married    Spouse name: Not on file  . Number of children: Not on file  . Years of education: Not on file  . Highest education level: Not on file  Occupational History    Employer: RETIRED    Comment: Retired Sales promotion account executive at [employer]    . Smoking status: Never Smoker  . Smokeless tobacco: Never Used  Substance and Sexual Activity  . Alcohol use: No  . Drug use: No  . Sexual activity: Not on file  Other Topics Concern  . Not on file  Social History Narrative  . Not on file   Social Determinants of Health   Financial Resource Strain:   . Difficulty of Paying Living Expenses:   Food Insecurity:   . Worried About Charity fundraiser in the Last Year:   . Arboriculturist in the Last Year:   Transportation Needs:   . Film/video editor (Medical):   Marland Kitchen Lack of Transportation (Non-Medical):   Physical Activity:   . Days of Exercise per Week:   . Minutes of Exercise per Session:   Stress:   . Feeling of Stress :   Social Connections:   . Frequency of Communication with Friends and Family:   . Frequency of Social Gatherings with Friends and Family:   . Attends Religious Services:   . Active Member of Clubs or Organizations:   . Attends Archivist Meetings:   Marland Kitchen Marital Status:      Family History:  The patient's family history includes Cancer in her father; Stroke in her mother and sister.   ROS General: Negative; No fevers, chills, or night sweats; patient will fatigue HEENT: Negative; No changes in vision or hearing, sinus congestion, difficulty swallowing Pulmonary: Negative; No cough, wheezing, shortness of breath, hemoptysis Cardiovascular: Occasional mild nonexertional left-sided chest sensation GI: GERD GU: Negative; No dysuria, hematuria, or difficulty voiding Musculoskeletal: Negative; no myalgias, joint pain, or weakness Hematologic/Oncology: Negative; no easy bruising, bleeding Endocrine: Negative; no heat/cold intolerance; no diabetes Neuro: Negative; no changes in balance, headaches Skin: Negative; No rashes or skin lesions Psychiatric: Negative; No behavioral problems, depression Sleep: Negative; No snoring, daytime sleepiness, hypersomnolence, bruxism, restless legs, hypnogognic  hallucinations, no cataplexy Other comprehensive 14 point system review is negative.   PHYSICAL EXAM:   VS:  BP (!) 144/62   Pulse 70   Ht 4' 11"  (1.499 m)   Wt 164 lb 12.8 oz (74.8 kg)   SpO2 97%   BMI 33.29 kg/m     Repeat blood pressure by me was 140/76  Wt Readings from Last 3 Encounters:  09/06/19 164 lb 12.8 oz (74.8 kg)  07/30/19 164 lb (74.4 kg)  03/06/19 161 lb 9.6 oz (73.3 kg)    General: Alert, oriented, no distress.  Skin: normal turgor, no rashes, warm and dry HEENT: Normocephalic, atraumatic. Pupils equal round and reactive to light; sclera anicteric; extraocular muscles intact;  Nose without nasal septal hypertrophy Mouth/Parynx benign; Mallinpatti scale 3 Neck: No JVD, no carotid bruits; normal carotid upstroke Lungs: clear to ausculatation and percussion; no wheezing or rales Chest wall: without tenderness to palpitation Heart: PMI not displaced, RRR, s1 s2 normal, 1/6 systolic murmur, no diastolic murmur, no rubs, gallops, thrills, or heaves Abdomen: soft, nontender; no hepatosplenomehaly, BS+; abdominal aorta nontender and not dilated by palpation. Back: no CVA tenderness Pulses 2+ Musculoskeletal: full range of motion, normal strength, no joint deformities Extremities: Trace ankle edema bilaterally no clubbing cyanosis or edema, Homan's sign negative  Neurologic: grossly nonfocal; Cranial nerves grossly wnl Psychologic: Normal mood and affect   Studies/Labs Reviewed:   ECG (independently read by me): Atrially paced rhythm with prolonged AV conduction with PR interval 222 ms.  Left bundle branch block.  QTc interval 483 ms  September 2020 EKG:  EKG is ordered today.  ECG (independently read by me): AV paced rhythm at 70 bpm.  Prolonged AV conduction with PR interval at 222 ms.  October 2018 ECG (independently read by me): AV paced rhythm with a ventricular rate at 70 bpm.  PR interval 224 ms.  QTc interval 477 ms.  March 2018 ECG (independently read  by me): AV paced rhythm at 70 bpm with prolonged AV conduction with a PR interval of 222 ms.  QTc interval 490 ms.  October 2017 ECG (independently read by me): AV paced rhythm with 100% pacing of both chambers.  PR interval 226 ms.    December 2016 ECG (independently read by me):  100% AV pacing with ventricular rate at 70 and prolonged AV conduction at 224 ms.  June 2016  ECG (independently read by me): AV sequentially paced rhythm with 100% capture.  Ventricular rate 70 beats per minute.  PR interval 224 ms.  August 2014 ECG: 80 paced rhythm at 70 beats per minute. PR interval 222 ms.   Recent Labs: BMP Latest Ref Rng & Units 02/04/2017 10/16/2014 10/15/2014  Glucose 65 - 99 mg/dL 120(H) 120(H) 107(H)  BUN 8 - 27 mg/dL 18 18 20   Creatinine 0.57 - 1.00 mg/dL 1.17(H) 1.14(H) 0.83  BUN/Creat Ratio 12 - 28 15 - -  Sodium 134 - 144 mmol/L 138 136 136  Potassium 3.5 - 5.2 mmol/L 4.1 3.7 3.9  Chloride 96 - 106 mmol/L 99 99 99  CO2 20 - 29 mmol/L 24 28 28   Calcium 8.7 - 10.3 mg/dL 9.0 9.0 8.9     Hepatic Function Latest Ref Rng & Units 10/16/2014 11/28/2007 11/24/2007  Total Protein 6.0 - 8.3 g/dL 6.6 5.9(L) 5.8(L)  Albumin 3.5 - 5.2 g/dL 3.6 2.6(L) 2.8(L)  AST 0 - 37 U/L 24 21 77(H)  ALT 0 - 35 U/L 21 29 85(H)  Alk Phosphatase 39 - 117 U/L 72 61 75  Total Bilirubin 0.3 - 1.2 mg/dL 0.3 0.4 0.4    CBC Latest Ref Rng & Units 02/04/2017 10/16/2014 11/28/2007  WBC 3.4 - 10.8 x10E3/uL 6.0 7.3 8.7  Hemoglobin 11.1 - 15.9 g/dL 11.4 11.0(L) 9.7(L)  Hematocrit 34.0 - 46.6 % 34.4 33.3(L) 28.5(L)  Platelets 150 - 379 x10E3/uL 222 216 183  Lab Results  Component Value Date   MCV 90 02/04/2017   MCV 91.0 10/16/2014   MCV 91.2 11/28/2007   Lab Results  Component Value Date   TSH 3.166 Test methodology is 3rd generation TSH 11/24/2007   No results found for: HGBA1C   BNP No results found for: BNP  ProBNP    Component Value Date/Time   PROBNP 101.0 (H) 09/09/2014 1423     Lipid  Panel     Component Value Date/Time   CHOL 136 03/13/2015 1436   TRIG 95 03/13/2015 1436   HDL 57 03/13/2015 1436   CHOLHDL 2.4 03/13/2015 1436   VLDL 19 03/13/2015 1436   LDLCALC 60 03/13/2015 1436     RADIOLOGY: No results found.    ASSESSMENT:    1. Pacemaker   2. Paroxysmal atrial fibrillation (H[facility])   3. SSS (sick sinus syndrome) ([place])   4. CHB (complete heart block) (H[facility])   5. Leg edema   6. Essential hypertension   7. Gastroesophageal reflux disease without esophagitis      PLAN:  1.  Essential hypertension: Blood pressure has been mainly in the 093 systolically.  Blood pressure on recheck by me today was 140/76.  She has been on Cardizem CD 180 mg, metoprolol succinate 25 mg, valsartan HCT 160/25 mg.  She does have trace ankle edema and I have suggested the addition of furosemide 20 mg to take on an as-needed basis which will also help blood pressure.   2.  Underlying complete heart block/status post permanent pacemaker: Initial pacemaker insertion June 2009 with generator change out August 2018.  I reviewed her most recent evaluation with Dr. Elisabeth Most from July 30, 2019.  She continues to be pacemaker dependent with no underlying escape rhythm.  She does have symptoms suggestive of chronotropic incompetence and her pacemaker was reprogrammed.  She has not had any AF recorded since 2012 and has very rare PAT which is well controlled with diltiazem and metoprolol.  3. Atypical chest pain: Previously she has had mild chest wall tenderness.  She does not have ischemic rest pain symptomatology.    4. Hyperlipidemia: She continues to be on pravastatin 40 mg daily.  She sees Dr. Elyse Hsu who checks her laboratory and labs will be repeated this week.   5.  History of PAF: Maintaining sinus rhythm.  6.  Leg edema: Intermittent, I have given her a prescription for furosemide 20 mg to take on an as-needed basis.  We discussed sodium restriction.  7.  GERD: I have suggested she  try taking her pantoprazole after dinner to avoid some reflux symptomatology which she has been experiencing at bedtime.   Medication Adjustments/Labs and Tests Ordered: Current medicines are reviewed at length with the patient today.  Concerns regarding medicines are outlined above.  Medication changes, Labs and Tests ordered today are listed in the Patient Instructions below. Patient Instructions  Medication Instructions:  BEGIN TAKING FUROSEMIDE 20MG TABLETS (1 TABLET) AS NEEDED FOR LEG SWELLING. - MAY TAKE TOMORROW AND THEN TO EVERY OTHER DAY, THEN TO EVERY 3RD DAY, THEN AS NEEDED FOR SWELLING  BEGIN TAKING YOUR PANTOPRAZOLE IN THE EVENING AROUND DINNER TIME.  *If you need a refill on your cardiac medications before your next appointment, please call your pharmacy*   Follow-Up: At [facility], you and your health needs are our priority.  As part of our continuing mission to provide you with exceptional heart care, we have created designated Provider Care Teams.  These Care Teams  include your primary Cardiologist (physician) and Advanced Practice Providers (APPs -  Physician Assistants and Nurse Practitioners) who all work together to provide you with the care you need, when you need it.  We recommend signing up for the patient portal called "MyChart".  Sign up information is provided on this After Visit Summary.  MyChart is used to connect with patients for Virtual Visits (Telemedicine).  Patients are able to view lab/test results, encounter notes, upcoming appointments, etc.  Non-urgent messages can be sent to your provider as well.   To learn more about what you can do with MyChart, go to [email or web address].    Your next appointment:   6 month(s)  The format for your next appointment:   In Person  Provider:   Shelva Majestic, MD       Signed, Shelva Majestic, MD  09/08/2019 11:49 AM    [facility] [address], [place], [place], [place]   [address] Phone: [phone]

## 2019-09-06 NOTE — Patient Instructions (Addendum)
Medication Instructions:  BEGIN TAKING FUROSEMIDE 20MG  TABLETS (1 TABLET) AS NEEDED FOR LEG SWELLING. - MAY TAKE TOMORROW AND THEN TO EVERY OTHER DAY, THEN TO EVERY 3RD DAY, THEN AS NEEDED FOR SWELLING  BEGIN TAKING YOUR PANTOPRAZOLE IN THE EVENING AROUND DINNER TIME.  *If you need a refill on your cardiac medications before your next appointment, please call your pharmacy*   Follow-Up: At [facility], you and your health needs are our priority.  As part of our continuing mission to provide you with exceptional heart care, we have created designated Provider Care Teams.  These Care Teams include your primary Cardiologist (physician) and Advanced Practice Providers (APPs -  Physician Assistants and Nurse Practitioners) who all work together to provide you with the care you need, when you need it.  We recommend signing up for the patient portal called "MyChart".  Sign up information is provided on this After Visit Summary.  MyChart is used to connect with patients for Virtual Visits (Telemedicine).  Patients are able to view lab/test results, encounter notes, upcoming appointments, etc.  Non-urgent messages can be sent to your provider as well.   To learn more about what you can do with MyChart, go to [email or web address].    Your next appointment:   6 month(s)  The format for your next appointment:   In Person  Provider:   Shelva Majestic, MD

## 2019-09-08 ENCOUNTER — Encounter: Payer: Self-pay | Admitting: Cardiovascular Disease

## 2019-09-10 DIAGNOSIS — E039 Hypothyroidism, unspecified: Secondary | ICD-10-CM | POA: Diagnosis not present

## 2019-09-10 DIAGNOSIS — R5382 Chronic fatigue, unspecified: Secondary | ICD-10-CM | POA: Diagnosis not present

## 2019-09-10 DIAGNOSIS — I10 Essential (primary) hypertension: Secondary | ICD-10-CM | POA: Diagnosis not present

## 2019-09-10 DIAGNOSIS — E7800 Pure hypercholesterolemia, unspecified: Secondary | ICD-10-CM | POA: Diagnosis not present

## 2019-09-10 DIAGNOSIS — R7301 Impaired fasting glucose: Secondary | ICD-10-CM | POA: Diagnosis not present

## 2019-09-12 ENCOUNTER — Ambulatory Visit (INDEPENDENT_AMBULATORY_CARE_PROVIDER_SITE_OTHER): Payer: Medicare Other | Admitting: *Deleted

## 2019-09-12 ENCOUNTER — Encounter: Payer: Self-pay | Admitting: Podiatry

## 2019-09-12 DIAGNOSIS — R5382 Chronic fatigue, unspecified: Secondary | ICD-10-CM | POA: Diagnosis not present

## 2019-09-12 DIAGNOSIS — I10 Essential (primary) hypertension: Secondary | ICD-10-CM | POA: Diagnosis not present

## 2019-09-12 DIAGNOSIS — E559 Vitamin D deficiency, unspecified: Secondary | ICD-10-CM | POA: Diagnosis not present

## 2019-09-12 DIAGNOSIS — E039 Hypothyroidism, unspecified: Secondary | ICD-10-CM | POA: Diagnosis not present

## 2019-09-12 DIAGNOSIS — E7800 Pure hypercholesterolemia, unspecified: Secondary | ICD-10-CM | POA: Diagnosis not present

## 2019-09-12 DIAGNOSIS — R600 Localized edema: Secondary | ICD-10-CM | POA: Diagnosis not present

## 2019-09-12 DIAGNOSIS — I442 Atrioventricular block, complete: Secondary | ICD-10-CM

## 2019-09-12 DIAGNOSIS — K219 Gastro-esophageal reflux disease without esophagitis: Secondary | ICD-10-CM | POA: Diagnosis not present

## 2019-09-12 DIAGNOSIS — R7301 Impaired fasting glucose: Secondary | ICD-10-CM | POA: Diagnosis not present

## 2019-09-12 LAB — CUP PACEART REMOTE DEVICE CHECK
Battery Remaining Longevity: 122 mo
Battery Remaining Percentage: 95.5 %
Battery Voltage: 3.01 V
Brady Statistic AP VP Percent: 79 %
Brady Statistic AP VS Percent: 1 %
Brady Statistic AS VP Percent: 21 %
Brady Statistic AS VS Percent: 1 %
Brady Statistic RA Percent Paced: 79 %
Brady Statistic RV Percent Paced: 99 %
Date Time Interrogation Session: 20210324042849
Implantable Lead Implant Date: 20090601
Implantable Lead Implant Date: 20090605
Implantable Lead Location: 753859
Implantable Lead Location: 753860
Implantable Pulse Generator Implant Date: 20180822
Lead Channel Impedance Value: 430 Ohm
Lead Channel Impedance Value: 660 Ohm
Lead Channel Pacing Threshold Amplitude: 0.375 V
Lead Channel Pacing Threshold Amplitude: 0.625 V
Lead Channel Pacing Threshold Pulse Width: 0.4 ms
Lead Channel Pacing Threshold Pulse Width: 0.4 ms
Lead Channel Sensing Intrinsic Amplitude: 3.3 mV
Lead Channel Setting Pacing Amplitude: 0.875
Lead Channel Setting Pacing Amplitude: 1.375
Lead Channel Setting Pacing Pulse Width: 0.4 ms
Lead Channel Setting Sensing Sensitivity: 4 mV
Pulse Gen Model: 2272
Pulse Gen Serial Number: 8932617

## 2019-09-12 NOTE — Progress Notes (Signed)
Subjective: Amabel Mccaffery presents today for follow up of painful mycotic nails b/l that are difficult to trim. Pain interferes with ambulation. Aggravating factors include wearing enclosed shoe gear. Pain is relieved with periodic professional debridement.   Allergies  Allergen Reactions  . Fluticasone-Salmeterol Other (See Comments)    unknown  . Sulfonamide Derivatives Other (See Comments)    unknown  . Benzonatate Other (See Comments)    tongue/throat symptoms in the past but tolerated more recently  . Erythromycin Nausea And Vomiting and Other (See Comments)    Stomach cramps  . Meloxicam Other (See Comments)    Mild dyspnea  . Sulfamethoxazole Other (See Comments)    Gastric intolerance    Objective: Vitals:   09/06/19 0945  Temp: (!) 97.2 F (36.2 C)    Pt 86 y.o. year old Caucasian female  in NAD. AAO x 3.   Vascular Examination:  Capillary refill time to digits immediate b/l. Palpable DP pulses b/l. Palpable PT pulses b/l. Pedal hair sparse b/l. Skin temperature gradient within normal limits b/l.  Dermatological Examination: Pedal skin with normal turgor, texture and tone bilaterally. No open wounds bilaterally. No interdigital macerations bilaterally. Toenails 1-5 b/l elongated, dystrophic, thickened, crumbly with subungual debris and tenderness to dorsal palpation. Porokeratotic lesion(s) submet head 5 right foot. No erythema, no edema, no drainage, no flocculence.   Incurvated nailplate left great toe medial border(s) with tenderness to palpation. No erythema, no edema, no drainage noted.  Musculoskeletal: Normal muscle strength 5/5 to all lower extremity muscle groups bilaterally, no pain crepitus or joint limitation noted with ROM b/l and plantarflexed 5th metatarsal right foot.  Neurological: Protective sensation intact 5/5 intact bilaterally with 10g monofilament b/l Vibratory sensation intact b/l  Assessment: 1. Pain due to onychomycosis of toenails of both  feet   2. Ingrown toenail without infection    Plan: -Toenails 1-5 b/l were debrided in length and girth with sterile nail nippers and dremel without iatrogenic bleeding. Offending nail border debrided and curretaged left hallux. Border cleansed with alcohol and triple antibiotic applied. No further treatment required by patient/caregiver. Porokeratosis submet head 5 right foot pared and enucleated with sterile scalpel blade without incident. -Patient to continue soft, supportive shoe gear daily. -Patient to report any pedal injuries to medical professional immediately. -Patient/POA to call should there be question/concern in the interim.  Return in about 3 months (around 12/07/2019) for nail and callus trim.

## 2019-09-13 NOTE — Progress Notes (Signed)
PPM Remote  

## 2019-10-13 DIAGNOSIS — Z23 Encounter for immunization: Secondary | ICD-10-CM | POA: Diagnosis not present

## 2019-10-22 DIAGNOSIS — D3131 Benign neoplasm of right choroid: Secondary | ICD-10-CM | POA: Diagnosis not present

## 2019-10-22 DIAGNOSIS — Z961 Presence of intraocular lens: Secondary | ICD-10-CM | POA: Diagnosis not present

## 2019-10-22 DIAGNOSIS — H26491 Other secondary cataract, right eye: Secondary | ICD-10-CM | POA: Diagnosis not present

## 2019-10-22 DIAGNOSIS — H10413 Chronic giant papillary conjunctivitis, bilateral: Secondary | ICD-10-CM | POA: Diagnosis not present

## 2019-10-22 DIAGNOSIS — H04123 Dry eye syndrome of bilateral lacrimal glands: Secondary | ICD-10-CM | POA: Diagnosis not present

## 2019-10-29 ENCOUNTER — Encounter (INDEPENDENT_AMBULATORY_CARE_PROVIDER_SITE_OTHER): Payer: Medicare Other | Admitting: Ophthalmology

## 2019-11-03 DIAGNOSIS — Z23 Encounter for immunization: Secondary | ICD-10-CM | POA: Diagnosis not present

## 2019-11-06 DIAGNOSIS — M79645 Pain in left finger(s): Secondary | ICD-10-CM | POA: Diagnosis not present

## 2019-11-06 DIAGNOSIS — M19042 Primary osteoarthritis, left hand: Secondary | ICD-10-CM | POA: Diagnosis not present

## 2019-11-27 DIAGNOSIS — M19042 Primary osteoarthritis, left hand: Secondary | ICD-10-CM | POA: Diagnosis not present

## 2019-12-12 ENCOUNTER — Ambulatory Visit: Payer: Medicare Other | Admitting: Podiatry

## 2019-12-12 ENCOUNTER — Ambulatory Visit (INDEPENDENT_AMBULATORY_CARE_PROVIDER_SITE_OTHER): Payer: Medicare Other | Admitting: *Deleted

## 2019-12-12 DIAGNOSIS — I442 Atrioventricular block, complete: Secondary | ICD-10-CM

## 2019-12-12 LAB — CUP PACEART REMOTE DEVICE CHECK
Battery Remaining Longevity: 121 mo
Battery Remaining Percentage: 95.5 %
Battery Voltage: 3.01 V
Brady Statistic AP VP Percent: 83 %
Brady Statistic AP VS Percent: 1 %
Brady Statistic AS VP Percent: 17 %
Brady Statistic AS VS Percent: 1 %
Brady Statistic RA Percent Paced: 82 %
Brady Statistic RV Percent Paced: 99 %
Date Time Interrogation Session: 20210623020014
Implantable Lead Implant Date: 20090601
Implantable Lead Implant Date: 20090605
Implantable Lead Location: 753859
Implantable Lead Location: 753860
Implantable Pulse Generator Implant Date: 20180822
Lead Channel Impedance Value: 410 Ohm
Lead Channel Impedance Value: 600 Ohm
Lead Channel Pacing Threshold Amplitude: 0.375 V
Lead Channel Pacing Threshold Amplitude: 1 V
Lead Channel Pacing Threshold Pulse Width: 0.4 ms
Lead Channel Pacing Threshold Pulse Width: 0.4 ms
Lead Channel Sensing Intrinsic Amplitude: 2.8 mV
Lead Channel Setting Pacing Amplitude: 1.25 V
Lead Channel Setting Pacing Amplitude: 1.375
Lead Channel Setting Pacing Pulse Width: 0.4 ms
Lead Channel Setting Sensing Sensitivity: 4 mV
Pulse Gen Model: 2272
Pulse Gen Serial Number: 8932617

## 2019-12-13 NOTE — Progress Notes (Signed)
Remote pacemaker transmission.   

## 2020-01-01 DIAGNOSIS — E039 Hypothyroidism, unspecified: Secondary | ICD-10-CM | POA: Diagnosis not present

## 2020-01-01 DIAGNOSIS — R7301 Impaired fasting glucose: Secondary | ICD-10-CM | POA: Diagnosis not present

## 2020-01-01 DIAGNOSIS — E7800 Pure hypercholesterolemia, unspecified: Secondary | ICD-10-CM | POA: Diagnosis not present

## 2020-01-01 DIAGNOSIS — I10 Essential (primary) hypertension: Secondary | ICD-10-CM | POA: Diagnosis not present

## 2020-01-03 ENCOUNTER — Other Ambulatory Visit: Payer: Self-pay | Admitting: Cardiovascular Disease

## 2020-01-04 DIAGNOSIS — M8588 Other specified disorders of bone density and structure, other site: Secondary | ICD-10-CM | POA: Diagnosis not present

## 2020-01-04 DIAGNOSIS — E890 Postprocedural hypothyroidism: Secondary | ICD-10-CM | POA: Diagnosis not present

## 2020-01-04 DIAGNOSIS — E559 Vitamin D deficiency, unspecified: Secondary | ICD-10-CM | POA: Diagnosis not present

## 2020-01-04 DIAGNOSIS — R7301 Impaired fasting glucose: Secondary | ICD-10-CM | POA: Diagnosis not present

## 2020-01-04 DIAGNOSIS — K219 Gastro-esophageal reflux disease without esophagitis: Secondary | ICD-10-CM | POA: Diagnosis not present

## 2020-01-04 DIAGNOSIS — E7800 Pure hypercholesterolemia, unspecified: Secondary | ICD-10-CM | POA: Diagnosis not present

## 2020-01-04 DIAGNOSIS — I10 Essential (primary) hypertension: Secondary | ICD-10-CM | POA: Diagnosis not present

## 2020-01-17 DIAGNOSIS — M25551 Pain in right hip: Secondary | ICD-10-CM | POA: Insufficient documentation

## 2020-01-28 ENCOUNTER — Encounter (INDEPENDENT_AMBULATORY_CARE_PROVIDER_SITE_OTHER): Payer: Medicare Other | Admitting: Ophthalmology

## 2020-03-06 ENCOUNTER — Encounter (INDEPENDENT_AMBULATORY_CARE_PROVIDER_SITE_OTHER): Payer: Self-pay | Admitting: Ophthalmology

## 2020-03-06 ENCOUNTER — Ambulatory Visit (INDEPENDENT_AMBULATORY_CARE_PROVIDER_SITE_OTHER): Payer: Medicare Other | Admitting: Ophthalmology

## 2020-03-06 ENCOUNTER — Other Ambulatory Visit: Payer: Self-pay

## 2020-03-06 DIAGNOSIS — H353111 Nonexudative age-related macular degeneration, right eye, early dry stage: Secondary | ICD-10-CM

## 2020-03-06 DIAGNOSIS — Z961 Presence of intraocular lens: Secondary | ICD-10-CM | POA: Insufficient documentation

## 2020-03-06 DIAGNOSIS — D3131 Benign neoplasm of right choroid: Secondary | ICD-10-CM

## 2020-03-06 DIAGNOSIS — H353121 Nonexudative age-related macular degeneration, left eye, early dry stage: Secondary | ICD-10-CM | POA: Diagnosis not present

## 2020-03-06 NOTE — Assessment & Plan Note (Signed)
The nature of choroidal nevus was discussed with the patient and an informational form was offered.  Photo documentation was discussed with the patient.  Periodic follow-up may be needed for a lifetime. The patient's questions were answered. At minimum, annual exams will be needed if no signs of early growth. 

## 2020-03-06 NOTE — Progress Notes (Signed)
03/06/2020     CHIEF COMPLAINT Patient presents for Retina Follow Up   HISTORY OF PRESENT ILLNESS: [name] is a 86 y.o. female who presents to the clinic today for:   HPI    Retina Follow Up    Patient presents with  Dry AMD.  In both eyes.  This started 2 years ago.  Severity is mild.  Duration of 2 years.  Since onset it is stable.          Comments    2 Year F/U OU, nevus right eye in the past and mild age-related maculopathy Pt c/o cloudy VA off and on OU. Pt sts VA OU overall stable. No new symptoms reported OU.       Last edited by Hurman Horn, MD on 03/06/2020  1:54 PM. (History)      Referring physician: Lorne Skeens, MD [place],  [place] [address]  HISTORICAL INFORMATION:   Selected notes from the MEDICAL RECORD NUMBER       CURRENT MEDICATIONS: Current Outpatient Medications (Ophthalmic Drugs)  Medication Sig  . cromolyn (OPTICROM) 4 % ophthalmic solution INSTILL 1 DROP INTO BOTH EYES 4 TIMES A DAY   No current facility-administered medications for this visit. (Ophthalmic Drugs)   Current Outpatient Medications (Other)  Medication Sig  . Ascorbic Acid (VITAMIN C PO) Take 2 tablets by mouth daily.  Marland Kitchen aspirin 81 MG tablet Take 81 mg by mouth at bedtime.   . Cholecalciferol (VITAMIN D-1000 MAX ST) 25 MCG (1000 UT) tablet Take 1 tablet once a day  . diltiazem (CARDIZEM CD) 180 MG 24 hr capsule TAKE 1 CAPSULE BY MOUTH EVERY DAY  . furosemide (LASIX) 20 MG tablet Take 1 tablet (20 mg total) by mouth as needed. For leg swelling  . LORazepam (ATIVAN) 0.5 MG tablet Take 0.5 mg by mouth daily as needed for anxiety.   . metoprolol succinate (TOPROL-XL) 25 MG 24 hr tablet Take 1 tablet (25 mg total) by mouth daily.  . pantoprazole (PROTONIX) 40 MG tablet Take 40 mg by mouth daily.  . pravastatin (PRAVACHOL) 40 MG tablet TAKE 1 TABLET BY MOUTH EVERY DAY  . Simethicone (GAS-X PO) Take 1 tablet by mouth daily as needed (gas).  .  SYNTHROID 25 MCG tablet Take 25 mcg by mouth daily before breakfast.   . valsartan-hydrochlorothiazide (DIOVAN-HCT) 160-25 MG tablet TAKE 1 TABLET BY MOUTH EVERY DAY   No current facility-administered medications for this visit. (Other)      REVIEW OF SYSTEMS:    ALLERGIES Allergies  Allergen Reactions  . Fluticasone-Salmeterol Other (See Comments)    unknown  . Sulfonamide Derivatives Other (See Comments)    unknown  . Benzonatate Other (See Comments)    tongue/throat symptoms in the past but tolerated more recently  . Erythromycin Nausea And Vomiting and Other (See Comments)    Stomach cramps  . Meloxicam Other (See Comments)    Mild dyspnea  . Sulfamethoxazole Other (See Comments)    Gastric intolerance    PAST MEDICAL HISTORY Past Medical History:  Diagnosis Date  . Allergic rhinitis   . Asthmatic bronchitis   . Chronic cough   . Esophageal reflux   . GERD (gastroesophageal reflux disease)   . History of stress test 11/20/2009   This was essentially normal,post-stress EF was hyperdynamic at 73 beats per mins, There was no scar or ischemia.  Marland Kitchen Hx of echocardiogram 11/05/2010   EF>55% showed a normal systolic  function with impaired diastolic dysfuntion and also tissue doppler suggesting increased elevated left atrial pressure, she had moderate LA dilatation and mild LA dilatation. There was calcified mitral apparatus with moderate MR, mild to moderate TR and trace aortic insufficiency.  . Hyperlipemia   . Hypertension   . PAF (paroxysmal atrial fibrillation) ([place])    Past Surgical History:  Procedure Laterality Date  . CHOLECYSTECTOMY    . FOOT SURGERY     right  . NASAL SEPTUM SURGERY    . PACEMAKER INSERTION  June 1st 2009   St Jude [place] device, model #5826, serial #[id number], The artial lead is a Interior and spatial designer, the ventricular lead is Environmental education officer.  Marland Kitchen PPM GENERATOR CHANGEOUT N/A 02/09/2017   Procedure: PPM GENERATOR CHANGEOUT;  Surgeon: Sanda Klein, MD;   Location: [facility];  Service: Cardiovascular;  Laterality: N/A;  . THYROID SURGERY    . TONSILLECTOMY    . VESICOVAGINAL FISTULA CLOSURE W/ TAH      FAMILY HISTORY Family History  Problem Relation Age of Onset  . Stroke Mother   . Cancer Father        bladder  . Stroke Sister     SOCIAL HISTORY Social History   Tobacco Use  . Smoking status: Never Smoker  . Smokeless tobacco: Never Used  Substance Use Topics  . Alcohol use: No  . Drug use: No         OPHTHALMIC EXAM:  Base Eye Exam    Visual Acuity (ETDRS)      Right Left   Dist [place] 20/30 +1 20/25 +2   Dist ph [place] 20/25 +1   Pt sts glasses are too cloudy to wear       Tonometry (Tonopen, 1:03 PM)      Right Left   Pressure 14 10       Pupils      Pupils Shape React APD   Right PERRL Round Brisk None   Left PERRL Round Brisk None       Visual Fields (Counting fingers)      Left Right    Full Full       Extraocular Movement      Right Left    Full Full       Neuro/Psych    Oriented x3: Yes   Mood/Affect: Normal       Dilation    Both eyes: 1.0% Mydriacyl, 2.5% Phenylephrine @ 1:04 PM        Slit Lamp and Fundus Exam    External Exam      Right Left   External Normal Normal       Slit Lamp Exam      Right Left   Lids/Lashes Normal Normal   Conjunctiva/Sclera White and quiet White and quiet   Cornea Clear Clear   Anterior Chamber Deep and quiet Deep and quiet   Iris Round and reactive Round and reactive   Lens Centered posterior chamber intraocular lens Centered posterior chamber intraocular lens   Anterior Vitreous Normal Normal       Fundus Exam      Right Left   Posterior Vitreous Normal Normal   Disc Normal Normal   C/D Ratio 0.15 0.25   Macula Early age related macular degeneration Early age related macular degeneration   Vessels Normal Normal   Periphery Flat irregularly pigmented choroidal nevus along the superotemporal arcade, 2 disc diameters in size, no  lipofuscin, no atrophy, no fluid  Normal          IMAGING AND PROCEDURES  Imaging and Procedures for 03/06/20  Color Fundus Photography Optos - OU - Both Eyes       Right Eye Progression has been stable. Disc findings include normal observations. Macula : normal observations. Vessels : normal observations.   Left Eye Progression has been stable. Disc findings include normal observations. Macula : normal observations. Vessels : normal observations. Periphery : normal observations.   Notes Choroidal nevus superior to the superotemporal arcade, no halo of atrophy, no thickening, no lipofuscin or fluid OD.  Stable over time.                ASSESSMENT/PLAN:  Choroidal nevus of right eye The nature of choroidal nevus was discussed with the patient and an informational form was offered.  Photo documentation was discussed with the patient.  Periodic follow-up may be needed for a lifetime. The patient's questions were answered. At minimum, annual exams will be needed if no signs of early growth.      ICD-10-CM   1. Choroidal nevus of right eye  D31.31 Color Fundus Photography Optos - OU - Both Eyes  2. Early stage nonexudative age-related macular degeneration of right eye  H35.3111   3. Early stage nonexudative age-related macular degeneration of left eye  H35.3121   4. Pseudophakia  Z96.1     1.  She is encouraged to use the eye vitamins at least once daily.  2.  I reassured the patient that she does not have an advanced form of macular degeneration either eye  3.  No change in the size of the small choroidal nevus in the right eye over time  Ophthalmic Meds Ordered this visit:  No orders of the defined types were placed in this encounter.      Return in about 2 years (around 03/06/2022) for DILATE OU, COLOR FP.  Patient Instructions  Was asked to report any new onset visual acuity distortion or decline in either eye    Explained the diagnoses, plan, and follow up  with the patient and they expressed understanding.  Patient expressed understanding of the importance of proper follow up care.   Clent Demark Alecea Trego M.D. Diseases & Surgery of the Retina and Vitreous [facility] 03/06/20     Abbreviations: M myopia (nearsighted); A astigmatism; H hyperopia (farsighted); P presbyopia; Mrx spectacle prescription;  CTL contact lenses; OD right eye; OS left eye; OU both eyes  XT exotropia; ET esotropia; PEK punctate epithelial keratitis; PEE punctate epithelial erosions; DES dry eye syndrome; MGD meibomian gland dysfunction; ATs artificial tears; PFAT's preservative free artificial tears; [place] nuclear sclerotic cataract; PSC posterior subcapsular cataract; ERM epi-retinal membrane; PVD posterior vitreous detachment; RD retinal detachment; DM diabetes mellitus; DR diabetic retinopathy; NPDR non-proliferative diabetic retinopathy; PDR proliferative diabetic retinopathy; CSME clinically significant macular edema; DME diabetic macular edema; dbh dot blot hemorrhages; CWS cotton wool spot; POAG primary open angle glaucoma; C/D cup-to-disc ratio; HVF humphrey visual field; GVF goldmann visual field; OCT optical coherence tomography; IOP intraocular pressure; BRVO Branch retinal vein occlusion; CRVO central retinal vein occlusion; CRAO central retinal artery occlusion; BRAO branch retinal artery occlusion; RT retinal tear; SB scleral buckle; PPV pars plana vitrectomy; VH Vitreous hemorrhage; PRP panretinal laser photocoagulation; IVK intravitreal kenalog; VMT vitreomacular traction; MH Macular hole;  NVD neovascularization of the disc; NVE neovascularization elsewhere; AREDS age related eye disease study; ARMD age related macular degeneration; POAG primary open angle glaucoma; EBMD  epithelial/anterior basement membrane dystrophy; ACIOL anterior chamber intraocular lens; IOL intraocular lens; PCIOL posterior chamber intraocular lens; Phaco/IOL phacoemulsification with  intraocular lens placement; [place] photorefractive keratectomy; LASIK laser assisted in situ keratomileusis; HTN hypertension; DM diabetes mellitus; COPD chronic obstructive pulmonary disease

## 2020-03-06 NOTE — Patient Instructions (Signed)
Was asked to report any new onset visual acuity distortion or decline in either eye

## 2020-03-12 ENCOUNTER — Ambulatory Visit (INDEPENDENT_AMBULATORY_CARE_PROVIDER_SITE_OTHER): Payer: Medicare Other | Admitting: *Deleted

## 2020-03-12 DIAGNOSIS — I442 Atrioventricular block, complete: Secondary | ICD-10-CM

## 2020-03-12 LAB — CUP PACEART REMOTE DEVICE CHECK
Battery Remaining Longevity: 119 mo
Battery Remaining Percentage: 95.5 %
Battery Voltage: 2.99 V
Brady Statistic AP VP Percent: 85 %
Brady Statistic AP VS Percent: 1 %
Brady Statistic AS VP Percent: 15 %
Brady Statistic AS VS Percent: 1 %
Brady Statistic RA Percent Paced: 84 %
Brady Statistic RV Percent Paced: 99 %
Date Time Interrogation Session: 20210922020011
Implantable Lead Implant Date: 20090601
Implantable Lead Implant Date: 20090605
Implantable Lead Location: 753859
Implantable Lead Location: 753860
Implantable Pulse Generator Implant Date: 20180822
Lead Channel Impedance Value: 410 Ohm
Lead Channel Impedance Value: 600 Ohm
Lead Channel Pacing Threshold Amplitude: 0.375 V
Lead Channel Pacing Threshold Amplitude: 0.75 V
Lead Channel Pacing Threshold Pulse Width: 0.4 ms
Lead Channel Pacing Threshold Pulse Width: 0.4 ms
Lead Channel Sensing Intrinsic Amplitude: 2.8 mV
Lead Channel Setting Pacing Amplitude: 1 V
Lead Channel Setting Pacing Amplitude: 1.375
Lead Channel Setting Pacing Pulse Width: 0.4 ms
Lead Channel Setting Sensing Sensitivity: 4 mV
Pulse Gen Model: 2272
Pulse Gen Serial Number: 8932617

## 2020-03-14 NOTE — Progress Notes (Signed)
Remote pacemaker transmission.   

## 2020-03-19 DIAGNOSIS — N398 Other specified disorders of urinary system: Secondary | ICD-10-CM | POA: Diagnosis not present

## 2020-03-22 DIAGNOSIS — Z23 Encounter for immunization: Secondary | ICD-10-CM | POA: Diagnosis not present

## 2020-04-09 DIAGNOSIS — E039 Hypothyroidism, unspecified: Secondary | ICD-10-CM | POA: Diagnosis not present

## 2020-04-09 DIAGNOSIS — R7301 Impaired fasting glucose: Secondary | ICD-10-CM | POA: Diagnosis not present

## 2020-04-09 DIAGNOSIS — E559 Vitamin D deficiency, unspecified: Secondary | ICD-10-CM | POA: Diagnosis not present

## 2020-04-09 DIAGNOSIS — I10 Essential (primary) hypertension: Secondary | ICD-10-CM | POA: Diagnosis not present

## 2020-04-09 DIAGNOSIS — E7800 Pure hypercholesterolemia, unspecified: Secondary | ICD-10-CM | POA: Diagnosis not present

## 2020-04-10 DIAGNOSIS — M5416 Radiculopathy, lumbar region: Secondary | ICD-10-CM | POA: Diagnosis not present

## 2020-04-10 DIAGNOSIS — M25552 Pain in left hip: Secondary | ICD-10-CM | POA: Diagnosis not present

## 2020-04-10 DIAGNOSIS — M25562 Pain in left knee: Secondary | ICD-10-CM | POA: Diagnosis not present

## 2020-04-10 DIAGNOSIS — M25561 Pain in right knee: Secondary | ICD-10-CM | POA: Diagnosis not present

## 2020-04-10 DIAGNOSIS — M25551 Pain in right hip: Secondary | ICD-10-CM | POA: Diagnosis not present

## 2020-04-11 DIAGNOSIS — I10 Essential (primary) hypertension: Secondary | ICD-10-CM | POA: Diagnosis not present

## 2020-04-11 DIAGNOSIS — E039 Hypothyroidism, unspecified: Secondary | ICD-10-CM | POA: Diagnosis not present

## 2020-04-11 DIAGNOSIS — R600 Localized edema: Secondary | ICD-10-CM | POA: Diagnosis not present

## 2020-04-11 DIAGNOSIS — E7800 Pure hypercholesterolemia, unspecified: Secondary | ICD-10-CM | POA: Diagnosis not present

## 2020-04-11 DIAGNOSIS — E559 Vitamin D deficiency, unspecified: Secondary | ICD-10-CM | POA: Diagnosis not present

## 2020-04-11 DIAGNOSIS — R7301 Impaired fasting glucose: Secondary | ICD-10-CM | POA: Diagnosis not present

## 2020-04-11 DIAGNOSIS — K219 Gastro-esophageal reflux disease without esophagitis: Secondary | ICD-10-CM | POA: Diagnosis not present

## 2020-04-11 DIAGNOSIS — R5382 Chronic fatigue, unspecified: Secondary | ICD-10-CM | POA: Diagnosis not present

## 2020-04-14 ENCOUNTER — Encounter (INDEPENDENT_AMBULATORY_CARE_PROVIDER_SITE_OTHER): Payer: Medicare Other | Admitting: Ophthalmology

## 2020-04-22 DIAGNOSIS — M6281 Muscle weakness (generalized): Secondary | ICD-10-CM | POA: Diagnosis not present

## 2020-04-29 DIAGNOSIS — M6281 Muscle weakness (generalized): Secondary | ICD-10-CM | POA: Diagnosis not present

## 2020-05-06 ENCOUNTER — Other Ambulatory Visit: Payer: Self-pay

## 2020-05-06 ENCOUNTER — Encounter: Payer: Self-pay | Admitting: Cardiovascular Disease

## 2020-05-06 ENCOUNTER — Ambulatory Visit (INDEPENDENT_AMBULATORY_CARE_PROVIDER_SITE_OTHER): Payer: Medicare Other | Admitting: Cardiovascular Disease

## 2020-05-06 DIAGNOSIS — Z950 Presence of cardiac pacemaker: Secondary | ICD-10-CM

## 2020-05-06 DIAGNOSIS — R0789 Other chest pain: Secondary | ICD-10-CM

## 2020-05-06 DIAGNOSIS — R600 Localized edema: Secondary | ICD-10-CM | POA: Diagnosis not present

## 2020-05-06 DIAGNOSIS — I480 Paroxysmal atrial fibrillation: Secondary | ICD-10-CM

## 2020-05-06 DIAGNOSIS — I10 Essential (primary) hypertension: Secondary | ICD-10-CM | POA: Diagnosis not present

## 2020-05-06 DIAGNOSIS — M6281 Muscle weakness (generalized): Secondary | ICD-10-CM | POA: Diagnosis not present

## 2020-05-06 DIAGNOSIS — I495 Sick sinus syndrome: Secondary | ICD-10-CM | POA: Diagnosis not present

## 2020-05-06 DIAGNOSIS — E7800 Pure hypercholesterolemia, unspecified: Secondary | ICD-10-CM

## 2020-05-06 NOTE — Patient Instructions (Addendum)
Medication Instructions:  Your physician recommends that you continue on your current medications as directed. Please refer to the Current Medication list given to you today.  *If you need a refill on your cardiac medications before your next appointment, please call your pharmacy*  Lab Work: NONE   Testing/Procedures: NONE   Follow-Up: At [facility], you and your health needs are our priority.  As part of our continuing mission to provide you with exceptional heart care, we have created designated Provider Care Teams.  These Care Teams include your primary Cardiologist (physician) and Advanced Practice Providers (APPs -  Physician Assistants and Nurse Practitioners) who all work together to provide you with the care you need, when you need it.  We recommend signing up for the patient portal called "MyChart".  Sign up information is provided on this After Visit Summary.  MyChart is used to connect with patients for Virtual Visits (Telemedicine).  Patients are able to view lab/test results, encounter notes, upcoming appointments, etc.  Non-urgent messages can be sent to your provider as well.   To learn more about what you can do with MyChart, go to [email or web address].    Your next appointment:    DR CRO[place]U [place]   DR [place] will receive a reminder letter in the mail two months in advance. If you don't receive a letter, please call our office to schedule the follow-up appointment.

## 2020-05-06 NOTE — Progress Notes (Signed)
Cardiology Office Note    Date:  05/11/2020   ID:  [name], DOB [birth year], MRN [id number]  PCP:  Lorne Skeens, MD  Cardiologist:  Shelva Majestic, MD   8 month F/U evaluation  History of Present Illness:  [name] is a 86 y.o. female who underwent dual-chamber pacemaker implantation for complete heart block. She has a history of paroxysmal atrial fibrillation, a history of lower extremity edema as well as GERD. She also has a history of hypertension as well as hyperlipidemia. A follow-up echo Doppler study on 10/23/2014 revealed an ejection fraction in the 50-55% range with grade 1 diastolic dysfunction and mild mitral regurgitation. Her last pacemaker interrogation was in June 2017 by Dr. Sallyanne Kuster. At that time, she had normal pacemaker function and had 97% atrial pacing and 100% ventricular pacing. There was no underlying escape rhythm. Estimated longevity was 1.75-2.25 years.  Typically she goes to bed at midnight and wakes up at 6 AM. She experiences a dry cough and is unaware of any postnasal drip. She has a history of GERD. She denies recent wheezing. She denies presyncope or syncope. At times she admits to intermittent leg swelling. She has a history of hypothyroidism on thyroid replacement.   She sees Dr. Elyse Hsu for primary and endocrinologic care and saw him on 08/03/2016. He is now part of the [facility]. Her recent lipid study on pravastatin from February 2018 showed a total cholesterol 137, triglycerides 89, HDL 61, and LDL 69.; Hemoglobin A1c was 5.8. TSH was 3.2. Vitamin D level was 42.   She underwent a dual pacemaker generator changeout on 02/09/2017  after her battery reached ERI. She has had an evaluation for reflux related laryngitis/cough by Dr. Constance Holster. She had remotely been on allergy shots but stopped in early 2018.  I saw her in October 2018, she has done well and has been undergoing every 21-monthpacemaker checks.   Unfortunately, she had broken her second metatarsal of her right foot in November 2019 and required wearing a boot for some time.  Her ambulation has been somewhat difficult since and she does use a cane for ambulation.  She has difficulty walking, and when doing so admits to leg fatigue and some hip discomfort.  She denies any exertional shortness of breath or chest tightness.  She is unaware of recent palpitations on her current therapy which includes diltiazem 180 mg, metoprolol succinate 25 mg daily and in addition for blood pressure control she also is on valsartan HCT 160/25 mg at times she does admit to some occasional ankle swelling at the end of the day but this is normalized in the morning.  She admits to sleeping well.  She is unaware of any recurrent episodes of atrial fibrillation.  She continues to be on pravastatin for hyperlipidemia.  I reviewed the medical records of Dr. ALetitia Neri  When she  saw Dr. AElyse Hsuher blood pressure on July 03, 2018 was 118/50.  Her LDL cholesterol at that time was 85 in December 2019 which had slightly improved from 3 months previously when her LDL was 54.   I evaluated her with a telemedicine visit on September 22, 2018.  At that time she was on valsartan HCT 160/25, diltiazem CD 1 8 mg and metoprolol succinate 25 mg.  Her blood pressure was elevated and I had suggested that she continue to monitor her blood pressure and that if additional blood pressure corneas were consistently elevated medication adjustment were necessary.  She was evaluated by Dr. Sallyanne Kuster in January 26, 2019 and her pacemaker was stable but she is pacemaker dependent and did not have any underlying escape rhythm.  Blood pressure was slightly elevated at 146/65.    I last saw her in September 2020 at which time she denied any exertional chest pain or shortness of breath.  She did experience mild nonexertional left-sided discomfort and also admitted to some fatigue.  She typically was going to  bed around 11:30 AM.  She felt that she was sleeping well.  She has GERD and was scheduled to see Dr. May got in follow-up.  She saw Dr. Sallyanne Kuster on July 30, 2019.  She was pacemaker dependent without underlying escape rhythm.  She still had symptoms suggestive of chronotropic incompetence and her pacemaker was reprogrammed.  She had not had any recorded atrial fibrillation in years.  I last saw her in March 2021 at which time she felt well and denied any anginal type chest pain.  She had experienced some mild intermittent leg swelling.    At times she notes some chest sensation nocturnally which sounded more GERD than ischemic.  She  continued to be on valsartan HCT 160/25 mg, Toprol-XL 25 mg and diltiazem 180 mg daily.  She had been taking pantoprazole in the morning.  She has hypothyroidism on levothyroxine.  She continued to take pravastatin 40 mg daily.  She was maintaining sinus rhythm.  Since I last saw her, she has continued to do fairly well but admits to back problems.  Occasionally she uses a cane to assist in walking.  She denies any chest pain.  She is scheduled to see Dr. Sallyanne Kuster in February for follow-up pacemaker.  She continues to be seen by Dr. Eden Emms who checks laboratory.  Most recent lipid studies in July 2021 showed total cholesterol 127, triglycerides 94, HDL 56 and LDL 64.  Hemoglobin A1c was 6.0.   Past Medical History:  Diagnosis Date  . Allergic rhinitis   . Asthmatic bronchitis   . Chronic cough   . Esophageal reflux   . GERD (gastroesophageal reflux disease)   . History of stress test 11/20/2009   This was essentially normal,post-stress EF was hyperdynamic at 73 beats per mins, There was no scar or ischemia.  Marland Kitchen Hx of echocardiogram 11/05/2010   EF>55% showed a normal systolic function with impaired diastolic dysfuntion and also tissue doppler suggesting increased elevated left atrial pressure, she had moderate LA dilatation and mild LA dilatation. There was  calcified mitral apparatus with moderate MR, mild to moderate TR and trace aortic insufficiency.  . Hyperlipemia   . Hypertension   . PAF (paroxysmal atrial fibrillation) ([place])     Past Surgical History:  Procedure Laterality Date  . CHOLECYSTECTOMY    . FOOT SURGERY     right  . NASAL SEPTUM SURGERY    . PACEMAKER INSERTION  June 1st 2009   [employer] Jude [place] device, model #5826, serial #[id number], The artial lead is a Interior and spatial designer, the ventricular lead is Environmental education officer.  Marland Kitchen PPM GENERATOR CHANGEOUT N/A 02/09/2017   Procedure: PPM GENERATOR CHANGEOUT;  Surgeon: Sanda Klein, MD;  Location: [facility];  Service: Cardiovascular;  Laterality: N/A;  . THYROID SURGERY    . TONSILLECTOMY    . VESICOVAGINAL FISTULA CLOSURE W/ TAH      Current Medications: Outpatient Medications Prior to Visit  Medication Sig Dispense Refill  . Ascorbic Acid (VITAMIN C PO) Take 2 tablets by  mouth daily.    Marland Kitchen aspirin 81 MG tablet Take 81 mg by mouth at bedtime.     . Cholecalciferol (VITAMIN D-1000 MAX ST) 25 MCG (1000 UT) tablet Take 1 tablet once a day    . cromolyn (OPTICROM) 4 % ophthalmic solution INSTILL 1 DROP INTO BOTH EYES 4 TIMES A DAY    . diltiazem (CARDIZEM CD) 180 MG 24 hr capsule TAKE 1 CAPSULE BY MOUTH EVERY DAY 90 capsule 3  . furosemide (LASIX) 20 MG tablet Take 1 tablet (20 mg total) by mouth as needed. For leg swelling 30 tablet 2  . LORazepam (ATIVAN) 0.5 MG tablet Take 0.5 mg by mouth daily as needed for anxiety.     . metoprolol succinate (TOPROL-XL) 25 MG 24 hr tablet Take 1 tablet (25 mg total) by mouth daily. 30 tablet 11  . pantoprazole (PROTONIX) 40 MG tablet Take 40 mg by mouth daily.    . pravastatin (PRAVACHOL) 40 MG tablet TAKE 1 TABLET BY MOUTH EVERY DAY 90 tablet 2  . Simethicone (GAS-X PO) Take 1 tablet by mouth daily as needed (gas).    . SYNTHROID 25 MCG tablet Take 25 mcg by mouth daily before breakfast.   12  . valsartan-hydrochlorothiazide (DIOVAN-HCT) 160-25 MG  tablet TAKE 1 TABLET BY MOUTH EVERY DAY 30 tablet 8   No facility-administered medications prior to visit.     Allergies:   Fluticasone-salmeterol, Sulfonamide derivatives, Benzonatate, Erythromycin, Meloxicam, and Sulfamethoxazole   Social History   Socioeconomic History  . Marital status: Married    Spouse name: Not on file  . Number of children: Not on file  . Years of education: Not on file  . Highest education level: Not on file  Occupational History    Employer: RETIRED    Comment: Retired Sales promotion account executive at [employer]  . Smoking status: Never Smoker  . Smokeless tobacco: Never Used  Substance and Sexual Activity  . Alcohol use: No  . Drug use: No  . Sexual activity: Not on file  Other Topics Concern  . Not on file  Social History Narrative  . Not on file   Social Determinants of Health   Financial Resource Strain:   . Difficulty of Paying Living Expenses: Not on file  Food Insecurity:   . Worried About Charity fundraiser in the Last Year: Not on file  . Ran Out of Food in the Last Year: Not on file  Transportation Needs:   . Lack of Transportation (Medical): Not on file  . Lack of Transportation (Non-Medical): Not on file  Physical Activity:   . Days of Exercise per Week: Not on file  . Minutes of Exercise per Session: Not on file  Stress:   . Feeling of Stress : Not on file  Social Connections:   . Frequency of Communication with Friends and Family: Not on file  . Frequency of Social Gatherings with Friends and Family: Not on file  . Attends Religious Services: Not on file  . Active Member of Clubs or Organizations: Not on file  . Attends Archivist Meetings: Not on file  . Marital Status: Not on file     Family History:  The patient's family history includes Cancer in her father; Stroke in her mother and sister.   ROS General: Negative; No fevers, chills, or night sweats; patient will fatigue HEENT: Negative; No  changes in vision or hearing, sinus congestion, difficulty swallowing Pulmonary: Negative; No cough, wheezing,  shortness of breath, hemoptysis Cardiovascular: Occasional mild nonexertional left-sided chest sensation GI: GERD GU: Negative; No dysuria, hematuria, or difficulty voiding Musculoskeletal: Back and hip discomfort Hematologic/Oncology: Negative; no easy bruising, bleeding Endocrine: Negative; no heat/cold intolerance; no diabetes Neuro: Negative; no changes in balance, headaches Skin: Negative; No rashes or skin lesions Psychiatric: Negative; No behavioral problems, depression Sleep: Negative; No snoring, daytime sleepiness, hypersomnolence, bruxism, restless legs, hypnogognic hallucinations, no cataplexy Other comprehensive 14 point system review is negative.   PHYSICAL EXAM:   VS:  BP (!) 132/48 (BP Location: Left Arm, Patient Position: Sitting)   Pulse 70   Ht _0  (1.499 m)   Wt 155 lb (70.3 kg)   SpO2 97%   BMI 31.31 kg/m     Repeat blood pressure by me was 126/68  Wt Readings from Last 3 Encounters:  05/06/20 155 lb (70.3 kg)  09/06/19 164 lb 12.8 oz (74.8 kg)  07/30/19 164 lb (74.4 kg)    General: Alert, oriented, no distress.  Skin: normal turgor, no rashes, warm and dry HEENT: Normocephalic, atraumatic. Pupils equal round and reactive to light; sclera anicteric; extraocular muscles intact;  Nose without nasal septal hypertrophy Mouth/Parynx benign; Mallinpatti scale 3 Neck: No JVD, no carotid bruits; normal carotid upstroke Lungs: clear to ausculatation and percussion; no wheezing or rales Chest wall: without tenderness to palpitation Heart: PMI not displaced, RRR, s1 s2 normal, 1/6 systolic murmur, no diastolic murmur, no rubs, gallops, thrills, or heaves Abdomen: soft, nontender; no hepatosplenomehaly, BS+; abdominal aorta nontender and not dilated by palpation. Back: no CVA tenderness Pulses 2+ Musculoskeletal: full range of motion, normal strength,  no joint deformities Extremities: no clubbing cyanosis or edema, Homan's sign negative  Neurologic: grossly nonfocal; Cranial nerves grossly wnl Psychologic: Normal mood and affect   Studies/Labs Reviewed:   ECG (independently read by me): AV paced rhythm at 70 with prolonged AV condiuction  March 2021 ECG (independently read by me): Atrially paced rhythm with prolonged AV conduction with PR interval 222 ms.  Left bundle branch block.  QTc interval 483 ms  September 2020 EKG:  EKG is ordered today.  ECG (independently read by me): AV paced rhythm at 70 bpm.  Prolonged AV conduction with PR interval at 222 ms.  October 2018 ECG (independently read by me): AV paced rhythm with a ventricular rate at 70 bpm.  PR interval 224 ms.  QTc interval 477 ms.  March 2018 ECG (independently read by me): AV paced rhythm at 70 bpm with prolonged AV conduction with a PR interval of 222 ms.  QTc interval 490 ms.  October 2017 ECG (independently read by me): AV paced rhythm with 100% pacing of both chambers.  PR interval 226 ms.    December 2016 ECG (independently read by me):  100% AV pacing with ventricular rate at 70 and prolonged AV conduction at 224 ms.  June 2016  ECG (independently read by me): AV sequentially paced rhythm with 100% capture.  Ventricular rate 70 beats per minute.  PR interval 224 ms.  August 2014 ECG: 80 paced rhythm at 70 beats per minute. PR interval 222 ms.   Recent Labs: BMP Latest Ref Rng & Units 02/04/2017 10/16/2014 10/15/2014  Glucose 65 - 99 mg/dL 120(H) 120(H) 107(H)  BUN 8 - 27 mg/dL _1 Creatinine 0.57 - 1.00 mg/dL 1.17(H) 1.14(H) 0.83  BUN/Creat Ratio 12 - 28 15 - -  Sodium 134 - 144 mmol/L 138 136 136  Potassium 3.5 - 5.2 mmol/L  4.1 3.7 3.9  Chloride 96 - 106 mmol/L 99 99 99  CO2 20 - 29 mmol/L _0 Calcium 8.7 - 10.3 mg/dL 9.0 9.0 8.9     Hepatic Function Latest Ref Rng & Units 10/16/2014 11/28/2007 11/24/2007  Total Protein 6.0 - 8.3 g/dL 6.6  5.9(L) 5.8(L)  Albumin 3.5 - 5.2 g/dL 3.6 2.6(L) 2.8(L)  AST 0 - 37 U/L 24 21 77(H)  ALT 0 - 35 U/L 21 29 85(H)  Alk Phosphatase 39 - 117 U/L 72 61 75  Total Bilirubin 0.3 - 1.2 mg/dL 0.3 0.4 0.4    CBC Latest Ref Rng & Units 02/04/2017 10/16/2014 11/28/2007  WBC 3.4 - 10.8 x10E3/uL 6.0 7.3 8.7  Hemoglobin 11.1 - 15.9 g/dL 11.4 11.0(L) 9.7(L)  Hematocrit 34.0 - 46.6 % 34.4 33.3(L) 28.5(L)  Platelets 150 - 379 x10E3/uL 222 216 183   Lab Results  Component Value Date   MCV 90 02/04/2017   MCV 91.0 10/16/2014   MCV 91.2 11/28/2007   Lab Results  Component Value Date   TSH 3.166 Test methodology is 3rd generation TSH 11/24/2007   No results found for: HGBA1C   BNP No results found for: BNP  ProBNP    Component Value Date/Time   PROBNP 101.0 (H) 09/09/2014 1423     Lipid Panel     Component Value Date/Time   CHOL 136 03/13/2015 1436   TRIG 95 03/13/2015 1436   HDL 57 03/13/2015 1436   CHOLHDL 2.4 03/13/2015 1436   VLDL 19 03/13/2015 1436   LDLCALC 60 03/13/2015 1436     RADIOLOGY: No results found.    ASSESSMENT:    1. Essential hypertension   2. Paroxysmal atrial fibrillation (HCC)   3. SSS (sick sinus syndrome) ([place])   4. Pacemaker   5. Hypercholesteremia   6. Atypical chest pain   7. Lower extremity edema      PLAN:  1.  Essential hypertension: Blood pressure today is controlled and on repeat by me was 126/68 on her regimen of diltiazem 180 mg, metoprolol succinate 25 mg and valsartan HCT 160/25 mg.  She also takes furosemide 20 mg as needed for leg swelling and typically has been taking this approximately 1 time per week.  2.  Underlying complete heart block/status post permanent pacemaker: Initial pacemaker insertion June 2009 with generator change out August 2018.  She is followed by Dr. Sallyanne Kuster and has a follow-up visit in February 2022.  She is pacemaker dependent.  She has not had any recurrent atrial fibrillation and has rare PAT controlled with  diltiazem and metoprolol.  ECG today shows AV paced rhythm at 70 bpm.  3. Atypical chest pain: Previously she had experienced nonischemic chest pain.  This essentially has resolved.     4. Hyperlipidemia: She continues to be on pravastatin 40 mg.  Most recent laboratory done by Dr. Eden Emms in July 2021 showed a total cholesterol of 127, triglycerides 94, HDL 56 and LDL cholesterol 64.   5.  History of PAF: She continues to maintain sinus rhythm on her combined therapy.  6.  Leg edema: Leg edema has improved with institution of furosemide 20 mg on an as-needed basis.  Typically she has been taking this 1 time per week.  7.  GERD: This is fairly well controlled with pantoprazole.  She will be seeing Dr. Sallyanne Kuster in February and I will see her in August 2022.  Medication Adjustments/Labs and Tests Ordered: Current medicines are reviewed at length with  the patient today.  Concerns regarding medicines are outlined above.  Medication changes, Labs and Tests ordered today are listed in the Patient Instructions below. Patient Instructions  Medication Instructions:  Your physician recommends that you continue on your current medications as directed. Please refer to the Current Medication list given to you today.  *If you need a refill on your cardiac medications before your next appointment, please call your pharmacy*  Lab Work: NONE   Testing/Procedures: NONE   Follow-Up: At [facility], you and your health needs are our priority.  As part of our continuing mission to provide you with exceptional heart care, we have created designated Provider Care Teams.  These Care Teams include your primary Cardiologist (physician) and Advanced Practice Providers (APPs -  Physician Assistants and Nurse Practitioners) who all work together to provide you with the care you need, when you need it.  We recommend signing up for the patient portal called "MyChart".  Sign up information is provided on this  After Visit Summary.  MyChart is used to connect with patients for Virtual Visits (Telemedicine).  Patients are able to view lab/test results, encounter notes, upcoming appointments, etc.  Non-urgent messages can be sent to your provider as well.   To learn more about what you can do with MyChart, go to [email or web address].    Your next appointment:    DR CROITORU [place]   DR [place] will receive a reminder letter in the mail two months in advance. If you don't receive a letter, please call our office to schedule the follow-up appointment.     Signed, Shelva Majestic, MD  05/11/2020 6:27 PM    [facility] [address], [place], [place], [place]  [address] Phone: [phone]

## 2020-05-10 DIAGNOSIS — Z23 Encounter for immunization: Secondary | ICD-10-CM | POA: Diagnosis not present

## 2020-05-11 ENCOUNTER — Encounter: Payer: Self-pay | Admitting: Cardiovascular Disease

## 2020-05-12 DIAGNOSIS — N952 Postmenopausal atrophic vaginitis: Secondary | ICD-10-CM | POA: Diagnosis not present

## 2020-05-12 DIAGNOSIS — N362 Urethral caruncle: Secondary | ICD-10-CM | POA: Diagnosis not present

## 2020-05-12 DIAGNOSIS — R309 Painful micturition, unspecified: Secondary | ICD-10-CM | POA: Diagnosis not present

## 2020-05-12 DIAGNOSIS — N3020 Other chronic cystitis without hematuria: Secondary | ICD-10-CM | POA: Diagnosis not present

## 2020-05-12 DIAGNOSIS — R8290 Unspecified abnormal findings in urine: Secondary | ICD-10-CM | POA: Diagnosis not present

## 2020-05-13 DIAGNOSIS — M6281 Muscle weakness (generalized): Secondary | ICD-10-CM | POA: Diagnosis not present

## 2020-05-21 DIAGNOSIS — M6281 Muscle weakness (generalized): Secondary | ICD-10-CM | POA: Diagnosis not present

## 2020-05-27 DIAGNOSIS — M6281 Muscle weakness (generalized): Secondary | ICD-10-CM | POA: Diagnosis not present

## 2020-05-31 ENCOUNTER — Other Ambulatory Visit: Payer: Self-pay | Admitting: Cardiovascular Disease

## 2020-06-02 ENCOUNTER — Telehealth: Payer: Self-pay | Admitting: Cardiovascular Disease

## 2020-06-02 MED ORDER — METOPROLOL SUCCINATE ER 25 MG PO TB24
25.0000 mg | ORAL_TABLET | Freq: Every day | ORAL | 3 refills | Status: DC
Start: 2020-06-02 — End: 2021-06-02

## 2020-06-02 NOTE — Telephone Encounter (Signed)
*  STAT* If patient is at the pharmacy, call can be transferred to refill team.   1. Which medications need to be refilled? (please list name of each medication and dose if known)  metoprolol succinate (TOPROL-XL) 25 MG 24 hr tablet valsartan-hydrochlorothiazide (DIOVAN-HCT) 160-25 MG tablet  2. Which pharmacy/location (including street and city if local pharmacy) is medication to be sent to? [facility], [place] - [address]  3. Do they need a 30 day or 90 day supply? 30 day

## 2020-06-03 DIAGNOSIS — M6281 Muscle weakness (generalized): Secondary | ICD-10-CM | POA: Diagnosis not present

## 2020-06-12 ENCOUNTER — Ambulatory Visit (INDEPENDENT_AMBULATORY_CARE_PROVIDER_SITE_OTHER): Payer: Medicare Other

## 2020-06-12 DIAGNOSIS — I495 Sick sinus syndrome: Secondary | ICD-10-CM | POA: Diagnosis not present

## 2020-06-12 LAB — CUP PACEART REMOTE DEVICE CHECK
Battery Remaining Longevity: 119 mo
Battery Remaining Percentage: 95.5 %
Battery Voltage: 2.99 V
Brady Statistic AP VP Percent: 84 %
Brady Statistic AP VS Percent: 1 %
Brady Statistic AS VP Percent: 16 %
Brady Statistic AS VS Percent: 1 %
Brady Statistic RA Percent Paced: 84 %
Brady Statistic RV Percent Paced: 99 %
Date Time Interrogation Session: 20211223052759
Implantable Lead Implant Date: 20090601
Implantable Lead Implant Date: 20090605
Implantable Lead Location: 753859
Implantable Lead Location: 753860
Implantable Pulse Generator Implant Date: 20180822
Lead Channel Impedance Value: 400 Ohm
Lead Channel Impedance Value: 550 Ohm
Lead Channel Pacing Threshold Amplitude: 0.375 V
Lead Channel Pacing Threshold Amplitude: 0.875 V
Lead Channel Pacing Threshold Pulse Width: 0.4 ms
Lead Channel Pacing Threshold Pulse Width: 0.4 ms
Lead Channel Sensing Intrinsic Amplitude: 2.4 mV
Lead Channel Setting Pacing Amplitude: 1.125
Lead Channel Setting Pacing Amplitude: 1.375
Lead Channel Setting Pacing Pulse Width: 0.4 ms
Lead Channel Setting Sensing Sensitivity: 4 mV
Pulse Gen Model: 2272
Pulse Gen Serial Number: 8932617

## 2020-06-26 NOTE — Progress Notes (Signed)
Remote pacemaker transmission.   

## 2020-06-30 DIAGNOSIS — R8279 Other abnormal findings on microbiological examination of urine: Secondary | ICD-10-CM | POA: Diagnosis not present

## 2020-06-30 DIAGNOSIS — R300 Dysuria: Secondary | ICD-10-CM | POA: Diagnosis not present

## 2020-06-30 DIAGNOSIS — N3020 Other chronic cystitis without hematuria: Secondary | ICD-10-CM | POA: Diagnosis not present

## 2020-06-30 DIAGNOSIS — N362 Urethral caruncle: Secondary | ICD-10-CM | POA: Diagnosis not present

## 2020-06-30 DIAGNOSIS — N952 Postmenopausal atrophic vaginitis: Secondary | ICD-10-CM | POA: Diagnosis not present

## 2020-07-11 ENCOUNTER — Other Ambulatory Visit: Payer: Self-pay | Admitting: Urology

## 2020-07-11 ENCOUNTER — Telehealth: Payer: Self-pay | Admitting: Cardiovascular Disease

## 2020-07-11 NOTE — Telephone Encounter (Signed)
   [facility] Pre-operative Risk Assessment    HEARTCARE STAFF: - Please ensure there is not already an duplicate clearance open for this procedure. - Under Visit Info/Reason for Call, type in Other and utilize the format Clearance MM/DD/YY or Clearance TBD. Do not use dashes or single digits. - If request is for dental extraction, please clarify the # of teeth to be extracted.  Request for surgical clearance:  1. What type of surgery is being performed? Bladder biopsy  2. When is this surgery scheduled? 08/08/20  3. What type of clearance is required (medical clearance vs. Pharmacy clearance to hold med vs. Both)? both  4. Are there any medications that need to be held prior to surgery and how long? Aspirin 5 days prior  5. Practice name and name of physician performing surgery? Dr. Jeffie Pollock at [facility]  6. What is the office phone number? [place]   7.   What is the office fax number? [phone]  8.   Anesthesia type (None, local, MAC, general) ? MAC   Leah Newnam 07/11/2020, 3:34 PM  _________________________________________________________________   (provider comments below)

## 2020-07-11 NOTE — Telephone Encounter (Signed)
   Primary Cardiologist: Shelva Majestic, MD  Chart reviewed as part of pre-operative protocol coverage. Given past medical history and time since last visit, based on ACC/AHA guidelines, Sheala Dosh would be at acceptable risk for the planned procedure without further cardiovascular testing.   Her aspirin may be held for 5 days prior to her procedure.  Please resume as soon as hemostasis is achieved.  I will route this recommendation to the requesting party via Epic fax function and remove from pre-op pool.  Please call with questions.  Jossie Ng. Kyiah Canepa NP-C    07/11/2020, 3:54 PM [place] [address] Office [phone] Fax [phone]

## 2020-07-19 ENCOUNTER — Other Ambulatory Visit: Payer: Self-pay | Admitting: Cardiovascular Disease

## 2020-08-07 ENCOUNTER — Telehealth: Payer: Self-pay | Admitting: Cardiovascular Disease

## 2020-08-07 NOTE — Telephone Encounter (Signed)
Pt advised to have Urologist send over surgical clearance form. Pt verbalized understanding.

## 2020-08-07 NOTE — Telephone Encounter (Signed)
Patient got to have a bladder biopsy and wants to know if she can have it at [facility] and she will be put to sleep, and do she need to see Dr. Orene Desanctis before she gets it done. Please call back to discuss

## 2020-08-13 DIAGNOSIS — R7301 Impaired fasting glucose: Secondary | ICD-10-CM | POA: Diagnosis not present

## 2020-08-13 DIAGNOSIS — I10 Essential (primary) hypertension: Secondary | ICD-10-CM | POA: Diagnosis not present

## 2020-08-13 DIAGNOSIS — E7800 Pure hypercholesterolemia, unspecified: Secondary | ICD-10-CM | POA: Diagnosis not present

## 2020-08-15 DIAGNOSIS — E7800 Pure hypercholesterolemia, unspecified: Secondary | ICD-10-CM | POA: Diagnosis not present

## 2020-08-15 DIAGNOSIS — M8588 Other specified disorders of bone density and structure, other site: Secondary | ICD-10-CM | POA: Diagnosis not present

## 2020-08-15 DIAGNOSIS — E559 Vitamin D deficiency, unspecified: Secondary | ICD-10-CM | POA: Diagnosis not present

## 2020-08-15 DIAGNOSIS — E890 Postprocedural hypothyroidism: Secondary | ICD-10-CM | POA: Diagnosis not present

## 2020-08-15 DIAGNOSIS — R7301 Impaired fasting glucose: Secondary | ICD-10-CM | POA: Diagnosis not present

## 2020-08-15 DIAGNOSIS — I10 Essential (primary) hypertension: Secondary | ICD-10-CM | POA: Diagnosis not present

## 2020-08-18 ENCOUNTER — Encounter: Payer: Medicare Other | Admitting: Cardiovascular Disease

## 2020-08-20 ENCOUNTER — Other Ambulatory Visit: Payer: Self-pay

## 2020-08-20 ENCOUNTER — Ambulatory Visit (INDEPENDENT_AMBULATORY_CARE_PROVIDER_SITE_OTHER): Payer: Medicare Other | Admitting: Cardiovascular Disease

## 2020-08-20 ENCOUNTER — Encounter: Payer: Self-pay | Admitting: Cardiovascular Disease

## 2020-08-20 VITALS — BP 146/69 | HR 68 | Ht 59.0 in | Wt 159.0 lb

## 2020-08-20 DIAGNOSIS — Z950 Presence of cardiac pacemaker: Secondary | ICD-10-CM | POA: Diagnosis not present

## 2020-08-20 DIAGNOSIS — I471 Supraventricular tachycardia: Secondary | ICD-10-CM

## 2020-08-20 DIAGNOSIS — I442 Atrioventricular block, complete: Secondary | ICD-10-CM

## 2020-08-20 DIAGNOSIS — Z8679 Personal history of other diseases of the circulatory system: Secondary | ICD-10-CM

## 2020-08-20 DIAGNOSIS — Z01810 Encounter for preprocedural cardiovascular examination: Secondary | ICD-10-CM | POA: Diagnosis not present

## 2020-08-20 DIAGNOSIS — I495 Sick sinus syndrome: Secondary | ICD-10-CM | POA: Diagnosis not present

## 2020-08-20 DIAGNOSIS — I10 Essential (primary) hypertension: Secondary | ICD-10-CM

## 2020-08-20 DIAGNOSIS — E7800 Pure hypercholesterolemia, unspecified: Secondary | ICD-10-CM | POA: Diagnosis not present

## 2020-08-20 NOTE — Patient Instructions (Signed)

## 2020-08-20 NOTE — Progress Notes (Signed)
Patient ID: [name], female   DOB: 1932/06/07, 87 y.o.   MRN: [id number] Patient ID: [name], female   DOB: Jun 02, 1932, 87 y.o.   MRN: [id number]    Cardiology Office Note    Date:  08/21/2020   ID:  [name], DOB Aug 03, 1931, MRN [id number]  PCP:  Lorne Skeens, MD  Cardiologist: Shelva Majestic, M.D.;  Sanda Klein, MD   No chief complaint on file.   History of Present Illness:  [name] is a 87 y.o. female who returns in follow-up for sinus node dysfunction, complete heart block, paroxysmal atrial .fibrillation and pacemaker check.    Her initial pacemaker was implanted for complete heart block in 2009.  She underwent pacemaker change out for ERI in August 2018 (St. Jude [employer] MRI, but leads are not MRI conditional).  She has developed symptoms of sinus node dysfunction as well and has almost 100% atrial pacing.    She has no cardiovascular complaints.  She is worried about the need for general anesthesia with the planned cystoscopy for bladder cancer, scheduled on March 11 with Dr. Jeffie Pollock. The patient specifically denies any chest pain at rest exertion, dyspnea at rest or with exertion, orthopnea, paroxysmal nocturnal dyspnea, syncope, palpitations, focal neurological deficits, intermittent claudication, lower extremity edema, unexplained weight gain, cough, hemoptysis or wheezing.  Device function is normal.  She has an estimated generator longevity of about 9-10 years.  Similar to her last appointment she has 83 % atrial pacing and 100% ventricular pacing.  Heart rate histogram distribution is appropriate for her level of activity (she is rather sedentary due to problems with her back and legs).  Only three very brief episodes of paroxysmal atrial tachycardia have been recorded in the last 6 months.   Past Medical History:  Diagnosis Date  . Allergic rhinitis   . Asthmatic bronchitis   . Chronic cough   . Esophageal reflux   . GERD (gastroesophageal reflux  disease)   . History of stress test 11/20/2009   This was essentially normal,post-stress EF was hyperdynamic at 73 beats per mins, There was no scar or ischemia.  Marland Kitchen Hx of echocardiogram 11/05/2010   EF>55% showed a normal systolic function with impaired diastolic dysfuntion and also tissue doppler suggesting increased elevated left atrial pressure, she had moderate LA dilatation and mild LA dilatation. There was calcified mitral apparatus with moderate MR, mild to moderate TR and trace aortic insufficiency.  . Hyperlipemia   . Hypertension   . PAF (paroxysmal atrial fibrillation) ([place])     Past Surgical History:  Procedure Laterality Date  . CHOLECYSTECTOMY    . FOOT SURGERY     right  . NASAL SEPTUM SURGERY    . PACEMAKER INSERTION  June 1st 2009   St Jude [place] device, model #5826, serial #[id number], The artial lead is a Interior and spatial designer, the ventricular lead is Environmental education officer.  Marland Kitchen PPM GENERATOR CHANGEOUT N/A 02/09/2017   Procedure: PPM GENERATOR CHANGEOUT;  Surgeon: Sanda Klein, MD;  Location: [facility];  Service: Cardiovascular;  Laterality: N/A;  . THYROID SURGERY    . TONSILLECTOMY    . VESICOVAGINAL FISTULA CLOSURE W/ TAH      Current Outpatient Medications  Medication Sig Dispense Refill  . aspirin 81 MG tablet Take 81 mg by mouth at bedtime.    . calcium carbonate (TUMS EX) 750 MG chewable tablet Chew 1 tablet by mouth daily as needed for heartburn.    . Cholecalciferol 25 MCG (1000  UT) tablet Take 1,000 Units by mouth daily.    Marland Kitchen diltiazem (CARDIZEM CD) 180 MG 24 hr capsule TAKE 1 CAPSULE BY MOUTH EVERY DAY (Patient taking differently: Take 180 mg by mouth daily. TAKE 1 CAPSULE BY MOUTH EVERY DAY) 90 capsule 3  . furosemide (LASIX) 20 MG tablet Take 1 tablet (20 mg total) by mouth as needed. For leg swelling 30 tablet 2  . LORazepam (ATIVAN) 0.5 MG tablet Take 0.5 mg by mouth daily as needed for anxiety.    . metoprolol succinate (TOPROL-XL) 25 MG 24 hr tablet Take 1  tablet (25 mg total) by mouth daily. 90 tablet 3  . pantoprazole (PROTONIX) 40 MG tablet Take 40 mg by mouth daily.    . pravastatin (PRAVACHOL) 40 MG tablet TAKE 1 TABLET BY MOUTH EVERY DAY (Patient taking differently: Take 40 mg by mouth daily.) 90 tablet 2  . SYNTHROID 25 MCG tablet Take 25 mcg by mouth daily before breakfast.   12  . valsartan-hydrochlorothiazide (DIOVAN-HCT) 160-25 MG tablet TAKE 1 TABLET BY MOUTH EVERY DAY (Patient taking differently: Take 1 tablet by mouth daily.) 90 tablet 3   No current facility-administered medications for this visit.    Allergies:   Fluticasone-salmeterol, Sulfonamide derivatives, Benzonatate, Erythromycin, Meloxicam, and Sulfamethoxazole   Social History   Socioeconomic History  . Marital status: Married    Spouse name: Not on file  . Number of children: Not on file  . Years of education: Not on file  . Highest education level: Not on file  Occupational History    Employer: RETIRED    Comment: Retired Sales promotion account executive at [employer]  . Smoking status: Never Smoker  . Smokeless tobacco: Never Used  Substance and Sexual Activity  . Alcohol use: No  . Drug use: No  . Sexual activity: Not on file  Other Topics Concern  . Not on file  Social History Narrative  . Not on file   Social Determinants of Health   Financial Resource Strain: Not on file  Food Insecurity: Not on file  Transportation Needs: Not on file  Physical Activity: Not on file  Stress: Not on file  Social Connections: Not on file     Family History:  The patient's family history includes Cancer in her father; Stroke in her mother and sister.   ROS:   Please see the history of present illness.    Review of Systems  Musculoskeletal: Positive for arthritis, back pain, muscle cramps and stiffness.   All other systems reviewed and are negative.   PHYSICAL EXAM:   VS:  BP (!) 146/69   Pulse 68   Ht 4\' 11"  (1.499 m)   Wt 159 lb (72.1 kg)    SpO2 98%   BMI 32.11 kg/m     General: Alert, oriented x3, no distress, healthy left subclavian pacemaker site Head: no evidence of trauma, PERRL, EOMI, no exophtalmos or lid lag, no myxedema, no xanthelasma; normal ears, nose and oropharynx Neck: normal jugular venous pulsations and no hepatojugular reflux; brisk carotid pulses without delay and no carotid bruits Chest: clear to auscultation, no signs of consolidation by percussion or palpation, normal fremitus, symmetrical and full respiratory excursions Cardiovascular: normal position and quality of the apical impulse, regular rhythm, normal first and paradoxically split second heart sounds, no murmurs, rubs or gallops Abdomen: no tenderness or distention, no masses by palpation, no abnormal pulsatility or arterial bruits, normal bowel sounds, no hepatosplenomegaly Extremities: no clubbing, cyanosis or  edema; 2+ radial, ulnar and brachial pulses bilaterally; 2+ right femoral, posterior tibial and dorsalis pedis pulses; 2+ left femoral, posterior tibial and dorsalis pedis pulses; no subclavian or femoral bruits Neurological: grossly nonfocal Psych: Normal mood and affect    Wt Readings from Last 3 Encounters:  08/20/20 159 lb (72.1 kg)  05/06/20 155 lb (70.3 kg)  09/06/19 164 lb 12.8 oz (74.8 kg)      Studies/Labs Reviewed:   EKG:  EKG is ordered today and shows atrial paced, ventricular paced rhythm, QTC 495 ms.  The intracardiac electrogram shows the underlying rhythm is sinus at about 60 bpm, with complete heart block Recent Labs: No results found for requested labs within last 8760 hours.  04/04/2019 Hemoglobin A1c 5.9% Creatinine 0.7, potassium 4.5, ALT 13.0 Hemoglobin 12.5  Lipid Panel    Component Value Date/Time   CHOL 136 03/13/2015 1436   TRIG 95 03/13/2015 1436   HDL 57 03/13/2015 1436   CHOLHDL 2.4 03/13/2015 1436   VLDL 19 03/13/2015 1436   LDLCALC 60 03/13/2015 1436    04/04/2019 Total cholesterol 194, HDL  46, LDL 130, triglycerides 95 Hemoglobin A1c 5.9% Creatinine 0.7, potassium 4.5, ALT 13.0 Hemoglobin 12.5 ASSESSMENT:    1. CHB (complete heart block) ([facility])   2. SSS (sick sinus syndrome) ([place])   3. Pacemaker   4. PAT (paroxysmal atrial tachycardia) ([place])   5. History of atrial fibrillation   6. Essential hypertension   7. Hypercholesteremia      PLAN:  In order of problems listed above:  1. CHB: Pacemaker dependent. No underlying escape rhythm. 2. SSS: Has developed chronotropic incompetence.  At this time, the heart rate histogram distribution is appropriate for her lifestyle, and do not think it would help to further adjust her rate response sensor settings. 3. PPM: normal device function.  Continue remote downloads every 3 months.   4. PAT: Extremely low burden of brief and asymptomatic events.  On metoprolol and diltiazem. 5. AFib: Reported in the remote past, but has not been recorded by her pacemaker at least in the last 10 years.  Suspect this may have been erroneous diagnosis 6. HTN: Blood pressure is a little high today, but she reports that at home it is typically in the 120s/60s.  No changes made to her medications today. 7. HLP: Do not have her most recent lipid profile, will request from Dr. Elyse Hsu. 8. Preop cardiovascular exam: I believe she is at low risk for major cardiovascular complications with the planned cystoscopy.  No particular precautions are necessary regarding her pacemaker, since the procedure is not in proximity to her device.  For safety, could consider taping a magnet over the device during the electrofulguration part of the procedure.  Medication Adjustments/Labs and Tests Ordered: Current medicines are reviewed at length with the patient today.  Concerns regarding medicines are outlined above.  Medication changes, Labs and Tests ordered today are listed below. Patient Instructions  Medication Instructions:  No changes *If you need a refill on your  cardiac medications before your next appointment, please call your pharmacy*   Lab Work: None ordered If you have labs (blood work) drawn today and your tests are completely normal, you will receive your results only by: Marland Kitchen MyChart Message (if you have MyChart) OR . A paper copy in the mail If you have any lab test that is abnormal or we need to change your treatment, we will call you to review the results.   Testing/Procedures: None ordered  Follow-Up: At [facility], you and your health needs are our priority.  As part of our continuing mission to provide you with exceptional heart care, we have created designated Provider Care Teams.  These Care Teams include your primary Cardiologist (physician) and Advanced Practice Providers (APPs -  Physician Assistants and Nurse Practitioners) who all work together to provide you with the care you need, when you need it.  We recommend signing up for the patient portal called "MyChart".  Sign up information is provided on this After Visit Summary.  MyChart is used to connect with patients for Virtual Visits (Telemedicine).  Patients are able to view lab/test results, encounter notes, upcoming appointments, etc.  Non-urgent messages can be sent to your provider as well.   To learn more about what you can do with MyChart, go to [email or web address].    Your next appointment:   12 month(s)  The format for your next appointment:   In Person  Provider:   Sanda Klein, MD      Patient Instructions  Medication Instructions:  No changes *If you need a refill on your cardiac medications before your next appointment, please call your pharmacy*   Lab Work: None ordered If you have labs (blood work) drawn today and your tests are completely normal, you will receive your results only by: Marland Kitchen MyChart Message (if you have MyChart) OR . A paper copy in the mail If you have any lab test that is abnormal or we need to change your treatment, we  will call you to review the results.   Testing/Procedures: None ordered   Follow-Up: At [facility], you and your health needs are our priority.  As part of our continuing mission to provide you with exceptional heart care, we have created designated Provider Care Teams.  These Care Teams include your primary Cardiologist (physician) and Advanced Practice Providers (APPs -  Physician Assistants and Nurse Practitioners) who all work together to provide you with the care you need, when you need it.  We recommend signing up for the patient portal called "MyChart".  Sign up information is provided on this After Visit Summary.  MyChart is used to connect with patients for Virtual Visits (Telemedicine).  Patients are able to view lab/test results, encounter notes, upcoming appointments, etc.  Non-urgent messages can be sent to your provider as well.   To learn more about what you can do with MyChart, go to [email or web address].    Your next appointment:   12 month(s)  The format for your next appointment:   In Person  Provider:   Sanda Klein, MD     Signed, Sanda Klein, MD  08/21/2020 4:02 PM    [address], [place],   [address] Phone: [phone]; Fax: [phone]

## 2020-08-25 ENCOUNTER — Encounter: Payer: Self-pay | Admitting: Cardiovascular Disease

## 2020-08-25 NOTE — Progress Notes (Unsigned)
PERIOPERATIVE PRESCRIPTION FOR IMPLANTED CARDIAC DEVICE PROGRAMMING  Patient Information: Name:  [name]  DOB:  13-Oct-1931  MRN:  [id number]  {TIP - You do not have to delete this tip  -  Copy the info from the staff message sent by the PAT staff  then press F2 here and paste the information using CTL - V on the next line :[id number]}  Rosero, Lawana Pai, RN  P Cv Div Heartcare Device Planned Procedure: Cystoscopy  Surgeon: Dr. Irine Seal  Date of Procedure: 08/29/2020  Cautery will be used.  Position during surgery: *   Please send documentation back to:  [facility] (Fax # [phone])   Joline Maxcy, RN  08/25/2020 4:19 PM     Device Information:  Clinic EP Physician:  Dr. Dani Gobble Croitoru   Device Type:  Pacemaker Manufacturer and Phone #:  St. Jude/[employer]: [phone] Pacemaker Dependent?:  Yes.   Date of Last Device Check:  06/12/20 Normal Device Function?:  Yes.     Procedure may interfere with device function.  Magnet should be placed over device during procedure.   Per Device Clinic Standing Orders, York Ram, RN  5:10 PM 08/25/2020

## 2020-08-25 NOTE — Patient Instructions (Signed)
DUE TO COVID-19 ONLY ONE VISITOR IS ALLOWED TO COME WITH YOU AND STAY IN THE WAITING ROOM ONLY DURING PRE OP AND PROCEDURE DAY OF SURGERY. THE 1 VISITOR  MAY VISIT WITH YOU AFTER SURGERY IN YOUR PRIVATE ROOM DURING VISITING HOURS ONLY!  YOU NEED TO HAVE A COVID 19 TEST ON: 08/26/20 @ 3:10 PM , THIS TEST MUST BE DONE BEFORE SURGERY,  COVID TESTING SITE [place] [address], IT IS ON THE RIGHT GOING OUT [address] APPROXIMATELY  2 MINUTES PAST [employer] ON THE RIGHT. ONCE YOUR COVID TEST IS COMPLETED,  PLEASE BEGIN THE QUARANTINE INSTRUCTIONS AS OUTLINED IN YOUR HANDOUT.                [name]    Your procedure is scheduled on: 08/29/20   Report to [facility] Main  Entrance   Report to admitting at: 8:00 AM     Call this number if you have problems the morning of surgery [phone]    Remember: Do not eat solid food :After Midnight. Clear liquids until: 7:00 am.  CLEAR LIQUID DIET  Foods Allowed                                                                     Foods Excluded  Coffee and tea, regular and decaf                             liquids that you cannot  Plain Jell-O any favor except red or purple                                           see through such as: Fruit ices (not with fruit pulp)                                     milk, soups, orange juice  Iced Popsicles                                    All solid food Carbonated beverages, regular and diet                                    Cranberry, grape and apple juices Sports drinks like Gatorade Lightly seasoned clear broth or consume(fat free) Sugar, honey syrup  Sample Menu Breakfast                                Lunch                                     Supper Cranberry juice  Beef broth                            Chicken broth Jell-O                                     Grape juice                           Apple juice Coffee or tea                         Jell-O                                      Popsicle                                                Coffee or tea                        Coffee or tea  _____________________________________________________________________  BRUSH YOUR TEETH MORNING OF SURGERY AND RINSE YOUR MOUTH OUT, NO CHEWING GUM CANDY OR MINTS.    Take these medicines the morning of surgery with A SIP OF WATER: diltiazem,metoprolol,pantoprazole,synthroid.Lorazepam as needed.                               You may not have any metal on your body including hair pins and              piercings  Do not wear jewelry, make-up, lotions, powders or perfumes, deodorant             Do not wear nail polish on your fingernails.  Do not shave  48 hours prior to surgery.    Do not bring valuables to the hospital. [place].  Contacts, dentures or bridgework may not be worn into surgery.  Leave suitcase in the car. After surgery it may be brought to your room.     Patients discharged the day of surgery will not be allowed to drive home. IF YOU ARE HAVING SURGERY AND GOING HOME THE SAME DAY, YOU MUST HAVE AN ADULT TO DRIVE YOU HOME AND BE WITH YOU FOR 24 HOURS. YOU MAY GO HOME BY TAXI OR UBER OR ORTHERWISE, BUT AN ADULT MUST ACCOMPANY YOU HOME AND STAY WITH YOU FOR 24 HOURS.  Name and phone number of your driver:  Special Instructions: N/A              Please read over the following fact sheets you were given: _____________________________________________________________________         [facility] - Preparing for Surgery Before surgery, you can play an important role.  Because skin is not sterile, your skin needs to be as free of germs as possible.  You can reduce the number of germs on your skin by washing with CHG (chlorahexidine gluconate) soap before surgery.  CHG is an  antiseptic cleaner which kills germs and bonds with the skin to continue killing germs even after  washing. Please DO NOT use if you have an allergy to CHG or antibacterial soaps.  If your skin becomes reddened/irritated stop using the CHG and inform your nurse when you arrive at Short Stay. Do not shave (including legs and underarms) for at least 48 hours prior to the first CHG shower.  You may shave your face/neck. Please follow these instructions carefully:  1.  Shower with CHG Soap the night before surgery and the  morning of Surgery.  2.  If you choose to wash your hair, wash your hair first as usual with your  normal  shampoo.  3.  After you shampoo, rinse your hair and body thoroughly to remove the  shampoo.                           4.  Use CHG as you would any other liquid soap.  You can apply chg directly  to the skin and wash                       Gently with a scrungie or clean washcloth.  5.  Apply the CHG Soap to your body ONLY FROM THE NECK DOWN.   Do not use on face/ open                           Wound or open sores. Avoid contact with eyes, ears mouth and genitals (private parts).                       Wash face,  Genitals (private parts) with your normal soap.             6.  Wash thoroughly, paying special attention to the area where your surgery  will be performed.  7.  Thoroughly rinse your body with warm water from the neck down.  8.  DO NOT shower/wash with your normal soap after using and rinsing off  the CHG Soap.                9.  Pat yourself dry with a clean towel.            10.  Wear clean pajamas.            11.  Place clean sheets on your bed the night of your first shower and do not  sleep with pets. Day of Surgery : Do not apply any lotions/deodorants the morning of surgery.  Please wear clean clothes to the hospital/surgery center.  FAILURE TO FOLLOW THESE INSTRUCTIONS MAY RESULT IN THE CANCELLATION OF YOUR SURGERY PATIENT SIGNATURE_________________________________  NURSE  SIGNATURE__________________________________  ________________________________________________________________________

## 2020-08-26 ENCOUNTER — Encounter (HOSPITAL_COMMUNITY)
Admission: RE | Admit: 2020-08-26 | Discharge: 2020-08-26 | Disposition: A | Discharge status: Home / Self Care | Payer: Medicare Other | Source: Ambulatory Visit | Attending: Urology | Admitting: Urology

## 2020-08-26 ENCOUNTER — Other Ambulatory Visit: Payer: Self-pay

## 2020-08-26 ENCOUNTER — Other Ambulatory Visit (HOSPITAL_COMMUNITY)
Admission: RE | Admit: 2020-08-26 | Discharge: 2020-08-26 | Disposition: A | Discharge status: Home / Self Care | Payer: Medicare Other | Source: Ambulatory Visit | Attending: Urology | Admitting: Urology

## 2020-08-26 ENCOUNTER — Encounter (HOSPITAL_COMMUNITY): Payer: Self-pay

## 2020-08-26 DIAGNOSIS — I495 Sick sinus syndrome: Secondary | ICD-10-CM | POA: Diagnosis not present

## 2020-08-26 DIAGNOSIS — Z79890 Hormone replacement therapy (postmenopausal): Secondary | ICD-10-CM | POA: Insufficient documentation

## 2020-08-26 DIAGNOSIS — I480 Paroxysmal atrial fibrillation: Secondary | ICD-10-CM | POA: Diagnosis not present

## 2020-08-26 DIAGNOSIS — K219 Gastro-esophageal reflux disease without esophagitis: Secondary | ICD-10-CM | POA: Diagnosis not present

## 2020-08-26 DIAGNOSIS — Z79899 Other long term (current) drug therapy: Secondary | ICD-10-CM | POA: Insufficient documentation

## 2020-08-26 DIAGNOSIS — Z950 Presence of cardiac pacemaker: Secondary | ICD-10-CM | POA: Insufficient documentation

## 2020-08-26 DIAGNOSIS — Z20822 Contact with and (suspected) exposure to covid-19: Secondary | ICD-10-CM | POA: Insufficient documentation

## 2020-08-26 DIAGNOSIS — N329 Bladder disorder, unspecified: Secondary | ICD-10-CM | POA: Insufficient documentation

## 2020-08-26 DIAGNOSIS — Z01812 Encounter for preprocedural laboratory examination: Secondary | ICD-10-CM | POA: Insufficient documentation

## 2020-08-26 DIAGNOSIS — I10 Essential (primary) hypertension: Secondary | ICD-10-CM | POA: Insufficient documentation

## 2020-08-26 DIAGNOSIS — Z7982 Long term (current) use of aspirin: Secondary | ICD-10-CM | POA: Insufficient documentation

## 2020-08-26 HISTORY — DX: Angina pectoris, unspecified: I20.9

## 2020-08-26 HISTORY — DX: Hypothyroidism, unspecified: E03.9

## 2020-08-26 HISTORY — DX: Cardiac arrhythmia, unspecified: I49.9

## 2020-08-26 HISTORY — DX: Pneumonia, unspecified organism: J18.9

## 2020-08-26 LAB — BASIC METABOLIC PANEL
Anion gap: 9 (ref 5–15)
BUN: 25 mg/dL — ABNORMAL HIGH (ref 8–23)
CO2: 25 mmol/L (ref 22–32)
Calcium: 9.1 mg/dL (ref 8.9–10.3)
Chloride: 104 mmol/L (ref 98–111)
Creatinine, Ser: 1.17 mg/dL — ABNORMAL HIGH (ref 0.44–1.00)
GFR, Estimated: 45 mL/min — ABNORMAL LOW (ref >60)
Glucose, Bld: 125 mg/dL — ABNORMAL HIGH (ref 70–99)
Potassium: 3.7 mmol/L (ref 3.5–5.1)
Sodium: 138 mmol/L (ref 135–145)

## 2020-08-26 LAB — CBC
HCT: 37.5 % (ref 36.0–46.0)
Hemoglobin: 12.1 g/dL (ref 12.0–15.0)
MCH: 29.8 pg (ref 26.0–34.0)
MCHC: 32.3 g/dL (ref 30.0–36.0)
MCV: 92.4 fL (ref 80.0–100.0)
Platelets: 239 10*3/uL (ref 150–400)
RBC: 4.06 MIL/uL (ref 3.87–5.11)
RDW: 12 % (ref 11.5–15.5)
WBC: 8.6 10*3/uL (ref 4.0–10.5)
nRBC: 0 % (ref 0.0–0.2)

## 2020-08-26 NOTE — Progress Notes (Signed)
COVID Vaccine Completed: Yes Date COVID Vaccine completed: 05/10/20 COVID vaccine manufacturer: [place]     PCP - Dr. Legrand Como [place] Cardiologist - Dr. Shelva Majestic.: Clearance: 07/11/20/Dr. Dani Gobble Croitoru. LOV: 08/20/20  Chest x-ray -  EKG - 05/06/20 Stress Test -  ECHO -  Cardiac Cath -  Pacemaker/ICD device last checked: 06/12/20  Sleep Study -  CPAP -   Fasting Blood Sugar -  Checks Blood Sugar _____ times a day  Blood Thinner Instructions: Aspirin can be held for 5 days prior surgery as per cardiologist. Aspirin Instructions: Last Dose:  Anesthesia review: Hx: HTN,PAF  Patient denies shortness of breath, fever, cough and chest pain at PAT appointment   Patient verbalized understanding of instructions that were given to them at the PAT appointment. Patient was also instructed that they will need to review over the PAT instructions again at home before surgery.

## 2020-08-27 LAB — SARS CORONAVIRUS 2 (TAT 6-24 HRS): SARS Coronavirus 2: NEGATIVE

## 2020-08-27 NOTE — H&P (Signed)
I have pain or burning with urination.     [name] returns today in f/u. She was started on Premarin cream for atrophy at her last visit. She has had some improvement in the burning on the cream. She had pyuria with a negative culture at that visit and still has pyuria without bacteria today.   GU Hx: [name] is a former patient who is sent back in consultation by Dr. Elyse Hsu for buring with urination but negative cultures. The burning is variable. The burning is vaginal. She doesn't have excessive frequency. She has nocturia x 1-2. She can have some UUI if she is not careful. She has had no hematuria.    ALLERGIES: Erythromycin Base CPEP Nitrofurantoin Macrocrystal Powder Sulfa Drugs    MEDICATIONS: Metoprolol Succinate 25 mg tablet, extended release 24 hr  Aspirin 81 MG TABS Oral  Cromolyn Sodium 4 % drops  Diltiazem 24Hr Er 180 mg capsule, extended release 24hr  Furosemide 20 mg tablet  Gas-X  Lorazepam 0.5 mg tablet  Pantoprazole Sodium 40 mg granules delayed release for susp packet  Pravastatin Sodium 40 mg tablet  Premarin 0.625 mg/gram cream with applicator apply 9.2JJ or a pea sized amount on the finger tip vaginally at bedtime 2-3x weekly  Synthroid 25 mcg tablet  Valsartan-Hydrochlorothiazide  Vitamin C  Vitamin D     GU PSH: Catheterization For Collection Of Specimen, Single Patient, All Places Of Service - 05/12/2020 Catheterize For Residual - 05/12/2020 Hysterectomy Unilat SO - 2009       PSH Notes: Thyroid Surgery Total Thyroidectomy, Pacemaker Placement, Foot Surgery, Hysterectomy, Cholecystectomy   NON-GU PSH: Cataract Surgery.. Cholecystectomy (open) - 2009 Knee Arthroscopy/surgery Remove Thyroid - 2009     GU PMH: Chronic cystitis (w/o hematuria), Her UA is cloudy on the cath today and she had pyuria and microhematuria. I will get a cath culture today. I will have her return in 6-8 weeks for reevaluation. - 05/12/2020, Chronic cystitis, - 2014 Painful  micturition, Unspec, She has some ongoing dysuria and does have atrophy with an irritated caruncle but she also has a cath UA that is consistent with infection. - 05/12/2020 Postmenopausal atrophic vaginitis (Stable), I am going to have her use premarin cream 0.5 gm nightly for 2 weeks and then 2-3x weekly for the atrophy and caruncle and then have her return in a 6-8 weeks for reevaluation. - 05/12/2020, Atrophic vaginitis, - 2014 Urethral caruncle, She has a small, but irritated urethral caruncle that could be the source of the burning. I will treat this with premarin cream. - 05/12/2020 Nocturia, Nocturia - 2014 Oth GU systems Signs/Symptoms, Bladder Pain - 2014 Other microscopic hematuria, Microscopic hematuria - 2014 Rectocele, Rectocele - 2014 Urinary Frequency, Increased urinary frequency - 2014 Urinary Retention, Unspec, Incomplete bladder emptying - 2014 Urinary Urgency, Urinary urgency - 2014      PMH Notes:  1898-06-21 00:00:00 - Note: Normal Routine History And Physical Senior Citizen [id number])  2008-05-22 15:04:51 - Note: Urinary Tract Infection   NON-GU PMH: Personal history of other diseases of the circulatory system, History of hypertension - 2014, History of heart failure, - 2014 Personal history of other diseases of the digestive system, History of esophageal reflux - 2014 Personal history of other infectious and parasitic diseases, History of hepatitis - 2014 GERD    FAMILY HISTORY: 1 Daughter - Other 2 sons - Other Acute Myocardial Infarction - Sister, Mother Bladder Cancer - Father Death In The Family Father - Father Death In The Family Mother -  Mother Family Health Status Number - Runs In Family   SOCIAL HISTORY: Marital Status: Widowed     Notes: Caffeine Use, Marital History - Currently Married, Retired From Work, Tobacco Use, Alcohol Use   REVIEW OF SYSTEMS:    GU Review Female:   Patient reports burning /pain with urination. Patient denies frequent  urination, hard to postpone urination, get up at night to urinate, leakage of urine, stream starts and stops, trouble starting your stream, have to strain to urinate, and being pregnant.  Gastrointestinal (Upper):   Patient denies nausea, vomiting, and indigestion/ heartburn.  Gastrointestinal (Lower):   Patient denies diarrhea and constipation.  Constitutional:   Patient denies fever, night sweats, weight loss, and fatigue.  Skin:   Patient denies skin rash/ lesion and itching.  Eyes:   Patient denies blurred vision and double vision.  Ears/ Nose/ Throat:   Patient denies sore throat and sinus problems.  Hematologic/Lymphatic:   Patient denies swollen glands and easy bruising.  Cardiovascular:   Patient denies leg swelling and chest pains.  Respiratory:   Patient denies cough and shortness of breath.  Endocrine:   Patient denies excessive thirst.  Musculoskeletal:   Patient denies back pain and joint pain.  Neurological:   Patient denies headaches and dizziness.  Psychologic:   Patient denies depression and anxiety.   VITAL SIGNS:      06/30/2020 10:57 AM  Weight 157 lb / 71.21 kg  BP 147/71 mmHg  Heart Rate 85 /min  Temperature 97.8 F / 36.5 C   GU PHYSICAL EXAMINATION:    Urethral Meatus: Urethral caruncle. Normal size. Normal position. No discharge.   Vagina: Severe vaginal atrophy. Small cystocele. No stenosis. No rectocele. No enterocele.    MULTI-SYSTEM PHYSICAL EXAMINATION:       Complexity of Data:  Records Review:   Previous Patient Records  Urine Test Review:   Urinalysis, Urine Culture   PROCEDURES:         Flexible Cystoscopy - 52000  Risks, benefits, and some of the potential complications of the procedure were discussed. She was prepped with betadine. Cipro 500mg  given for antibiotic prophylaxis.    Meatus:  Caruncle. Normal size. Normal location.  Urethra:  No hypermobility. No leakage.  Ureteral Orifices:  Normal location. Normal size. Normal shape. Effluxed  clear urine.  Bladder:  Erythematous mucosa diffuse with changes consistent with severe follicular cystitis.. No trabeculation. No tumors. No stones.      The procedure was well tolerated and there were no complications.         UTI+ - [id number], [id number], [id number], [id number], [id number], [id number], [id number] UTI Plus has been ordered to detect urinary organisms by PCR.          Urinalysis w/Scope Dipstick Dipstick Cont'd Micro  Color: Yellow Bilirubin: Neg mg/dL WBC/hpf: >60/hpf  Appearance: Cloudy Ketones: Neg mg/dL RBC/hpf: 0 - 2/hpf  Specific Gravity: 1.020 Blood: 1+ ery/uL Bacteria: Rare (0-9/hpf)  pH: 6.0 Protein: Neg mg/dL Cystals: NS (Not Seen)  Glucose: Trace mg/dL Urobilinogen: 0.2 mg/dL Casts: NS (Not Seen)    Nitrites: Neg Trichomonas: Not Present    Leukocyte Esterase: 3+ leu/uL Mucous: Not Present      Epithelial Cells: 0 - 5/hpf      Yeast: NS (Not Seen)      Sperm: Not Present    ASSESSMENT:      ICD-10 Details  1 GU:   Dysuria - R30.0 Chronic, Improving - She has persistent pyuria with findings consistent with chronic  follicular cystitis on cystoscopy. I am going to get a UTI+ since the culture was negative at her last visit. If the UTI+ is negative, I may need to consider a bladder biopsy to r/o CIS.   2   Chronic cystitis (w/o hematuria) - N30.20 Chronic, Stable  4   Postmenopausal atrophic vaginitis - N95.2 Chronic, Stable - She may have has some mild improvement with the premarin cream which she will continue.   5   Urethral caruncle - N36.2 Chronic, Stable - She has a stable caruncle.   3 NON-GU:   Pyuria/other UA findings - R82.79 Chronic, Stable   PLAN:           Document Letter(s):  Created for Patient: Clinical Summary           APPENDED NOTES:  Her UTI+ was negative as had been her prior cultures. I have notified her of the results. With the erythema of the bladder wall, despite the appearance consistent with folliuciular cystitis, she is going to need a biopsy to r/o  CIS. I have reviewed the risks of bleeding, infection, bladder wall injiury and anesthetic complications. We will get her cleared by cardiology.

## 2020-08-27 NOTE — Anesthesia Preprocedure Evaluation (Addendum)
Anesthesia Evaluation  Patient identified by MRN, date of birth, ID band Patient awake    Reviewed: Allergy & Precautions, NPO status , Patient's Chart, lab work & pertinent test results  Airway Mallampati: III  TM Distance: >3 FB Neck ROM: Full    Dental  (+) Poor Dentition, Chipped, Missing   Pulmonary    Pulmonary exam normal breath sounds clear to auscultation       Cardiovascular hypertension, Pt. on home beta blockers and Pt. on medications Normal cardiovascular exam+ dysrhythmias Atrial Fibrillation + pacemaker  Rhythm:Regular Rate:Normal  ECG: rate 70   Neuro/Psych negative neurological ROS  negative psych ROS   GI/Hepatic Neg liver ROS, GERD  Medicated and Controlled,  Endo/Other  Hypothyroidism   Renal/GU negative Renal ROS     Musculoskeletal negative musculoskeletal ROS (+)   Abdominal (+) + obese,   Peds  Hematology HLD   Anesthesia Other Findings BLADDER WALL LESION  Reproductive/Obstetrics                           Anesthesia Physical Anesthesia Plan  ASA: III  Anesthesia Plan: MAC   Post-op Pain Management:    Induction: Intravenous  PONV Risk Score and Plan: 2 and Ondansetron, Dexamethasone, Propofol infusion and Treatment may vary due to age or medical condition  Airway Management Planned: Simple Face Mask  Additional Equipment:   Intra-op Plan:   Post-operative Plan:   Informed Consent: I have reviewed the patients History and Physical, chart, labs and discussed the procedure including the risks, benefits and alternatives for the proposed anesthesia with the patient or authorized representative who has indicated his/her understanding and acceptance.     Dental advisory given  Plan Discussed with: CRNA  Anesthesia Plan Comments: (Reviewed PAT note 08/26/2020, Konrad Felix, PA-C)      Anesthesia Quick Evaluation

## 2020-08-27 NOTE — Progress Notes (Signed)
Anesthesia Chart Review   Case: [id number] Date/Time: 08/29/20 0945   Procedure: CYSTOSCOPY WITH BIOPSY WITH FULGURATION (N/A )   Anesthesia type: Monitor Anesthesia Care   Pre-op diagnosis: BLADDER WALL LESION   Location: [place] [facility]   Surgeons: Irine Seal, MD      DISCUSSION:87 y.o. never smoker with h/o HTN, GERD, PAF, pacemaker in place due to CHB/sinus node dysfunction (device orders in 08/25/2020 progress note), bladder wall lesion scheduled for above procedure 08/29/20 with Dr. Irine Seal.   Pt last seen by cardiology 08/20/2020. Per OV note PPM with normal device function. " believe she is at low risk for major cardiovascular complications with the planned cystoscopy.  No particular precautions are necessary regarding her pacemaker, since the procedure is not in proximity to her device.  For safety, could consider taping a magnet over the device during the electrofulguration part of the procedure."  Anticipate pt can proceed with planned procedure barring acute status change.    VS: BP (!) 146/103   Pulse 69   Temp 36.8 C (Oral)   Ht 4\' 11"  (1.499 m)   Wt 71.2 kg   BMI 31.71 kg/m   PROVIDERS: Althe[place], Legrand Como, MD is PCP   Sanda Klein, MD is Cardiologist   LABS: Labs reviewed: Acceptable for surgery. (all labs ordered are listed, but only abnormal results are displayed)  Labs Reviewed  BASIC METABOLIC PANEL - Abnormal; Notable for the following components:      Result Value   Glucose, Bld 125 (*)    BUN 25 (*)    Creatinine, Ser 1.17 (*)    GFR, Estimated 45 (*)    All other components within normal limits  CBC     IMAGES:   EKG: 05/06/2020 Rate 70 bpm  AV dual-pace rhythm with prolonged AV conduction   CV: Echo 10/23/2014 Study Conclusions   - Left ventricle: The cavity size was normal. Wall thickness was  normal. Systolic function was normal. The estimated ejection  fraction was in the range of 50% to 55%. Wall motion was normal;   there were no regional wall motion abnormalities. Doppler  parameters are consistent with abnormal left ventricular  relaxation (grade 1 diastolic dysfunction).  - Mitral valve: There was mild regurgitation.   Stress Test 10/14/2009 -the post stress myocardial perfusion images show a normal pattern of perfuion in all regions. The post stress left ventricle is normal in size.  -The post-stress ejection fraction is 73%. No significant wall motion abnormalities noted.  -Compared to the previous study, there is no significant change. Normal Myocardial Study. This is a low risk scan.  Past Medical History:  Diagnosis Date  . Allergic rhinitis   . Anginal pain ([place])   . Asthmatic bronchitis   . Chronic cough   . Dysrhythmia    PAF  . Esophageal reflux   . GERD (gastroesophageal reflux disease)   . History of stress test 11/20/2009   This was essentially normal,post-stress EF was hyperdynamic at 73 beats per mins, There was no scar or ischemia.  Marland Kitchen Hx of echocardiogram 11/05/2010   EF>55% showed a normal systolic function with impaired diastolic dysfuntion and also tissue doppler suggesting increased elevated left atrial pressure, she had moderate LA dilatation and mild LA dilatation. There was calcified mitral apparatus with moderate MR, mild to moderate TR and trace aortic insufficiency.  . Hyperlipemia   . Hypertension   . Hypothyroidism   . PAF (paroxysmal atrial fibrillation) ([place])   .  Pneumonia     Past Surgical History:  Procedure Laterality Date  . CHOLECYSTECTOMY    . FOOT SURGERY     right  . NASAL SEPTUM SURGERY    . PACEMAKER INSERTION  June 1st 2009   St Jude Herculaneum device, model #5826, serial #[id number], The artial lead is a Interior and spatial designer, the ventricular lead is Environmental education officer.  Marland Kitchen PPM GENERATOR CHANGEOUT N/A 02/09/2017   Procedure: PPM GENERATOR CHANGEOUT;  Surgeon: Sanda Klein, MD;  Location: [facility];  Service: Cardiovascular;  Laterality: N/A;  .  THYROID SURGERY    . TONSILLECTOMY    . TONSILLECTOMY    . VESICOVAGINAL FISTULA CLOSURE W/ TAH      MEDICATIONS: . aspirin 81 MG tablet  . calcium carbonate (TUMS EX) 750 MG chewable tablet  . Cholecalciferol 25 MCG (1000 UT) tablet  . diltiazem (CARDIZEM CD) 180 MG 24 hr capsule  . furosemide (LASIX) 20 MG tablet  . LORazepam (ATIVAN) 0.5 MG tablet  . metoprolol succinate (TOPROL-XL) 25 MG 24 hr tablet  . pantoprazole (PROTONIX) 40 MG tablet  . pravastatin (PRAVACHOL) 40 MG tablet  . SYNTHROID 25 MCG tablet  . valsartan-hydrochlorothiazide (DIOVAN-HCT) 160-25 MG tablet   No current facility-administered medications for this encounter.     Konrad Felix, PA-C [facility] [phone]

## 2020-08-28 NOTE — Progress Notes (Signed)
[name] made aware to arrive at [facility] 08/29/20.

## 2020-08-29 ENCOUNTER — Ambulatory Visit (HOSPITAL_COMMUNITY): Payer: Medicare Other | Admitting: Anesthesiology

## 2020-08-29 ENCOUNTER — Ambulatory Visit (HOSPITAL_COMMUNITY): Payer: Medicare Other | Admitting: Physician Assistant

## 2020-08-29 ENCOUNTER — Ambulatory Visit (HOSPITAL_COMMUNITY)
Admission: RE | Admit: 2020-08-29 | Discharge: 2020-08-29 | Disposition: A | Discharge status: Home / Self Care | Payer: Medicare Other | Attending: Urology | Admitting: Urology

## 2020-08-29 ENCOUNTER — Encounter (HOSPITAL_COMMUNITY)
Admission: RE | Disposition: A | Discharge status: Home / Self Care | Payer: Self-pay | Source: Home / Self Care | Attending: Urology

## 2020-08-29 ENCOUNTER — Encounter (HOSPITAL_COMMUNITY): Payer: Self-pay | Admitting: Urology

## 2020-08-29 DIAGNOSIS — Z79890 Hormone replacement therapy (postmenopausal): Secondary | ICD-10-CM | POA: Diagnosis not present

## 2020-08-29 DIAGNOSIS — E785 Hyperlipidemia, unspecified: Secondary | ICD-10-CM | POA: Diagnosis not present

## 2020-08-29 DIAGNOSIS — N3030 Trigonitis without hematuria: Secondary | ICD-10-CM | POA: Diagnosis not present

## 2020-08-29 DIAGNOSIS — E890 Postprocedural hypothyroidism: Secondary | ICD-10-CM | POA: Insufficient documentation

## 2020-08-29 DIAGNOSIS — N952 Postmenopausal atrophic vaginitis: Secondary | ICD-10-CM | POA: Insufficient documentation

## 2020-08-29 DIAGNOSIS — Z882 Allergy status to sulfonamides status: Secondary | ICD-10-CM | POA: Diagnosis not present

## 2020-08-29 DIAGNOSIS — N362 Urethral caruncle: Secondary | ICD-10-CM | POA: Insufficient documentation

## 2020-08-29 DIAGNOSIS — Z7982 Long term (current) use of aspirin: Secondary | ICD-10-CM | POA: Diagnosis not present

## 2020-08-29 DIAGNOSIS — Z950 Presence of cardiac pacemaker: Secondary | ICD-10-CM | POA: Insufficient documentation

## 2020-08-29 DIAGNOSIS — Z79899 Other long term (current) drug therapy: Secondary | ICD-10-CM | POA: Insufficient documentation

## 2020-08-29 DIAGNOSIS — Z8052 Family history of malignant neoplasm of bladder: Secondary | ICD-10-CM | POA: Insufficient documentation

## 2020-08-29 DIAGNOSIS — Z881 Allergy status to other antibiotic agents status: Secondary | ICD-10-CM | POA: Insufficient documentation

## 2020-08-29 DIAGNOSIS — N3289 Other specified disorders of bladder: Secondary | ICD-10-CM | POA: Diagnosis not present

## 2020-08-29 DIAGNOSIS — Z8249 Family history of ischemic heart disease and other diseases of the circulatory system: Secondary | ICD-10-CM | POA: Insufficient documentation

## 2020-08-29 DIAGNOSIS — E559 Vitamin D deficiency, unspecified: Secondary | ICD-10-CM | POA: Diagnosis not present

## 2020-08-29 DIAGNOSIS — N329 Bladder disorder, unspecified: Secondary | ICD-10-CM | POA: Diagnosis present

## 2020-08-29 DIAGNOSIS — I480 Paroxysmal atrial fibrillation: Secondary | ICD-10-CM | POA: Diagnosis not present

## 2020-08-29 HISTORY — PX: CYSTOSCOPY WITH BIOPSY: SHX5122

## 2020-08-29 SURGERY — CYSTOSCOPY, WITH BIOPSY
Anesthesia: Monitor Anesthesia Care

## 2020-08-29 MED ORDER — PHENYLEPHRINE 40 MCG/ML (10ML) SYRINGE FOR IV PUSH (FOR BLOOD PRESSURE SUPPORT)
PREFILLED_SYRINGE | INTRAVENOUS | Status: DC | PRN
  Administered 2020-08-29: 80 ug via INTRAVENOUS

## 2020-08-29 MED ORDER — SODIUM CHLORIDE 0.9% FLUSH: 3.0000 mL | Freq: Two times a day (BID) | INTRAVENOUS | Status: DC

## 2020-08-29 MED ORDER — ACETAMINOPHEN 10 MG/ML IV SOLN: 1000.0000 mg | Freq: Once | INTRAVENOUS | Status: DC | PRN

## 2020-08-29 MED ORDER — FENTANYL CITRATE (PF) 100 MCG/2ML IJ SOLN
INTRAMUSCULAR | Status: DC | PRN
  Administered 2020-08-29 (×2): 25 ug via INTRAVENOUS

## 2020-08-29 MED ORDER — PHENYLEPHRINE 40 MCG/ML (10ML) SYRINGE FOR IV PUSH (FOR BLOOD PRESSURE SUPPORT)
PREFILLED_SYRINGE | INTRAVENOUS | Status: AC
  Filled 2020-08-29: qty 10

## 2020-08-29 MED ORDER — CHLORHEXIDINE GLUCONATE 0.12 % MT SOLN
15.0000 mL | Freq: Once | OROMUCOSAL | Status: AC
  Administered 2020-08-29: 15 mL via OROMUCOSAL

## 2020-08-29 MED ORDER — LIDOCAINE 2% (20 MG/ML) 5 ML SYRINGE
INTRAMUSCULAR | Status: AC
  Filled 2020-08-29: qty 5

## 2020-08-29 MED ORDER — CEFAZOLIN SODIUM-DEXTROSE 2-4 GM/100ML-% IV SOLN
2.0000 g | INTRAVENOUS | Status: AC
  Administered 2020-08-29: 2 g via INTRAVENOUS
  Filled 2020-08-29: qty 100

## 2020-08-29 MED ORDER — PROPOFOL 10 MG/ML IV BOLUS
INTRAVENOUS | Status: AC
  Filled 2020-08-29: qty 20

## 2020-08-29 MED ORDER — FENTANYL CITRATE (PF) 100 MCG/2ML IJ SOLN: 25.0000 ug | INTRAMUSCULAR | Status: DC | PRN

## 2020-08-29 MED ORDER — PROPOFOL 500 MG/50ML IV EMUL
INTRAVENOUS | Status: DC | PRN
  Administered 2020-08-29: 125 ug/kg/min via INTRAVENOUS

## 2020-08-29 MED ORDER — ONDANSETRON HCL 4 MG/2ML IJ SOLN
INTRAMUSCULAR | Status: AC
  Filled 2020-08-29: qty 2

## 2020-08-29 MED ORDER — PROPOFOL 10 MG/ML IV BOLUS
INTRAVENOUS | Status: DC | PRN
  Administered 2020-08-29 (×3): 10 mg via INTRAVENOUS

## 2020-08-29 MED ORDER — ONDANSETRON HCL 4 MG/2ML IJ SOLN
INTRAMUSCULAR | Status: DC | PRN
  Administered 2020-08-29: 4 mg via INTRAVENOUS

## 2020-08-29 MED ORDER — ORAL CARE MOUTH RINSE: 15.0000 mL | Freq: Once | OROMUCOSAL | Status: AC

## 2020-08-29 MED ORDER — SODIUM CHLORIDE 0.9% FLUSH: 3.0000 mL | INTRAVENOUS | Status: DC | PRN

## 2020-08-29 MED ORDER — ONDANSETRON HCL 4 MG/2ML IJ SOLN: 4.0000 mg | Freq: Once | INTRAMUSCULAR | Status: DC | PRN

## 2020-08-29 MED ORDER — STERILE WATER FOR IRRIGATION IR SOLN
Status: DC | PRN
  Administered 2020-08-29: 6000 mL

## 2020-08-29 MED ORDER — FENTANYL CITRATE (PF) 100 MCG/2ML IJ SOLN
INTRAMUSCULAR | Status: AC
  Filled 2020-08-29: qty 2

## 2020-08-29 MED ORDER — LIDOCAINE HCL (CARDIAC) PF 100 MG/5ML IV SOSY
PREFILLED_SYRINGE | INTRAVENOUS | Status: DC | PRN
  Administered 2020-08-29: 40 mg via INTRAVENOUS

## 2020-08-29 MED ORDER — SODIUM CHLORIDE 0.9 % IR SOLN
Status: DC | PRN
  Administered 2020-08-29: 1000 mL

## 2020-08-29 MED ORDER — SODIUM CHLORIDE 0.9 % IV SOLN: 250.0000 mL | INTRAVENOUS | Status: DC | PRN

## 2020-08-29 MED ORDER — LACTATED RINGERS IV SOLN: INTRAVENOUS | Status: DC

## 2020-08-29 SURGICAL SUPPLY — 19 items
BAG DRN RND TRDRP ANRFLXCHMBR (UROLOGICAL SUPPLIES)
BAG URINE DRAIN 2000ML AR STRL (UROLOGICAL SUPPLIES) IMPLANT
BAG URO CATCHER STRL LF (MISCELLANEOUS) ×2 IMPLANT
CATH FOLEY 3WAY 30CC 22FR (CATHETERS) IMPLANT
CATH URET 5FR 28IN OPEN ENDED (CATHETERS) IMPLANT
DRAPE FOOT SWITCH (DRAPES) ×2 IMPLANT
GLOVE SURG POLYISO LF SZ8 (GLOVE) IMPLANT
GOWN STRL REUS W/TWL XL LVL3 (GOWN DISPOSABLE) ×2 IMPLANT
HOLDER FOLEY CATH W/STRAP (MISCELLANEOUS) IMPLANT
KIT TURNOVER KIT A (KITS) ×2 IMPLANT
LOOP CUT BIPOLAR 24F LRG (ELECTROSURGICAL) IMPLANT
MANIFOLD NEPTUNE II (INSTRUMENTS) ×2 IMPLANT
PACK CYSTO (CUSTOM PROCEDURE TRAY) ×2 IMPLANT
SUT ETHILON 3 0 PS 1 (SUTURE) IMPLANT
SYR 30ML LL (SYRINGE) IMPLANT
SYR TOOMEY IRRIG 70ML (MISCELLANEOUS)
SYRINGE TOOMEY IRRIG 70ML (MISCELLANEOUS) IMPLANT
TUBING CONNECTING 10 (TUBING) ×2 IMPLANT
TUBING UROLOGY SET (TUBING) ×2 IMPLANT

## 2020-08-29 NOTE — Interval H&P Note (Signed)
History and Physical Interval Note:  08/29/2020 8:32 AM  [name]  has presented today for surgery, with the diagnosis of [place].  The various methods of treatment have been discussed with the patient and family. After consideration of risks, benefits and other options for treatment, the patient has consented to  Procedure(s): CYSTOSCOPY WITH BIOPSY WITH FULGURATION (N/A) as a surgical intervention.  The patient's history has been reviewed, patient examined, no change in status, stable for surgery.  I have reviewed the patient's chart and labs.  Questions were answered to the patient's satisfaction.     Irine Seal

## 2020-08-29 NOTE — Op Note (Signed)
Procedure: Cystoscopy with bladder biopsy and fulguration.  Preoperative diagnosis.  Bladder wall lesions suspicious for carcinoma in situ.  Postop diagnosis: Bladder wall lesions suspicious for follicular cystitis.  Surgeon: Dr. Irine Seal.  Anesthesia: General.  Specimen: Bladder biopsies from the posterior wall and trigone.  Drains: None.  EBL: None.  Complications: None.  Indications: The patient is an 87 year old female with a history of chronic cystitis the more recently sterile pyuria and cystoscopic evidence of diffuse erythema the posterior wall is worrisome for carcinoma in situ.  There were a few spots also suggestive of follicular cystitis on office cystoscopy.  Procedure: She was taken the operating room where she was given Ancef.  She was placed in lithotomy position and given sedation as indicated.  She was fitted with PAS hose.  She was prepped with Betadine solution and draped in the usual sterile fashion.  Cystoscopy was performed using the 23 [place] scope and 30 degree lens.  Examination revealed a normal urethra.  The bladder wall had mild trabeculation.  The mucosa had findings consistent with diffuse follicular cystitis and it was not as worrisome for carcinoma in situ as it had been on office cystoscopy.  Ureteral orifices were unremarkable.  No papillary tumors were seen and no stones were noted.  The cup biopsy forceps were used to take biopsies from the trigone and the posterior wall where the lesions were densest.  The biopsy sites were then fulgurated with a Bugbee electrode.  Final inspection revealed no bladder wall perforation and no bleeding.  It was felt that the catheter was not indicated.  She was taken down from lithotomy position, her anesthetic was reversed and she was moved to recovery in stable condition.  There were no complications.

## 2020-08-29 NOTE — Anesthesia Postprocedure Evaluation (Signed)
Anesthesia Post Note  Patient: Jakiera Ehler  Procedure(s) Performed: CYSTOSCOPY WITH BIOPSY WITH FULGURATION (N/A )     Patient location during evaluation: PACU Anesthesia Type: MAC Level of consciousness: awake Pain management: pain level controlled Vital Signs Assessment: post-procedure vital signs reviewed and stable Respiratory status: spontaneous breathing, nonlabored ventilation, respiratory function stable and patient connected to nasal cannula oxygen Cardiovascular status: stable and blood pressure returned to baseline Postop Assessment: no apparent nausea or vomiting Anesthetic complications: no   No complications documented.  Last Vitals:  Vitals:   08/29/20 0957 08/29/20 1011  BP: 134/81 134/73  Pulse: 71 70  Resp:  16  Temp: (!) 36.1 C 36.5 C  SpO2: 98% 100%    Last Pain:  Vitals:   08/29/20 1011  TempSrc: Oral  PainSc: 0-No pain                 Reeve Turnley P Kamaiya Antilla

## 2020-08-29 NOTE — Discharge Instructions (Signed)

## 2020-08-29 NOTE — Transfer of Care (Signed)
Immediate Anesthesia Transfer of Care Note  Patient: [name]  Procedure(s) Performed: CYSTOSCOPY WITH BIOPSY WITH FULGURATION (N/A )  Patient Location: PACU  Anesthesia Type:MAC  Level of Consciousness: awake, alert  and oriented  Airway & Oxygen Therapy: Patient Spontanous Breathing and Patient connected to face mask oxygen  Post-op Assessment: Report given to RN and Post -op Vital signs reviewed and stable  Post vital signs: Reviewed and stable  Last Vitals:  Vitals Value Taken Time  BP 108/37 08/29/20 0927  Temp    Pulse 55 08/29/20 0928  Resp 17 08/29/20 0928  SpO2 100 % 08/29/20 0928  Vitals shown include unvalidated device data.  Last Pain:  Vitals:   08/29/20 0815  TempSrc:   PainSc: 0-No pain         Complications: No complications documented.

## 2020-08-30 ENCOUNTER — Encounter (HOSPITAL_COMMUNITY): Payer: Self-pay | Admitting: Urology

## 2020-09-01 LAB — SURGICAL PATHOLOGY

## 2020-09-10 DIAGNOSIS — R309 Painful micturition, unspecified: Secondary | ICD-10-CM | POA: Diagnosis not present

## 2020-09-10 DIAGNOSIS — N3020 Other chronic cystitis without hematuria: Secondary | ICD-10-CM | POA: Diagnosis not present

## 2020-09-10 DIAGNOSIS — N952 Postmenopausal atrophic vaginitis: Secondary | ICD-10-CM | POA: Diagnosis not present

## 2020-09-11 ENCOUNTER — Ambulatory Visit (INDEPENDENT_AMBULATORY_CARE_PROVIDER_SITE_OTHER): Payer: Medicare Other

## 2020-09-11 DIAGNOSIS — I495 Sick sinus syndrome: Secondary | ICD-10-CM | POA: Diagnosis not present

## 2020-09-12 ENCOUNTER — Telehealth: Payer: Self-pay

## 2020-09-12 LAB — CUP PACEART REMOTE DEVICE CHECK
Battery Remaining Longevity: 86 mo
Battery Remaining Percentage: 95.5 %
Battery Voltage: 2.99 V
Brady Statistic AP VP Percent: 82 %
Brady Statistic AP VS Percent: 1 %
Brady Statistic AS VP Percent: 18 %
Brady Statistic AS VS Percent: 1 %
Brady Statistic RA Percent Paced: 82 %
Brady Statistic RV Percent Paced: 99 %
Date Time Interrogation Session: 20220324040018
Implantable Lead Implant Date: 20090601
Implantable Lead Implant Date: 20090605
Implantable Lead Location: 753859
Implantable Lead Location: 753860
Implantable Pulse Generator Implant Date: 20180822
Lead Channel Impedance Value: 400 Ohm
Lead Channel Impedance Value: 480 Ohm
Lead Channel Pacing Threshold Amplitude: 0.375 V
Lead Channel Pacing Threshold Amplitude: 0.875 V
Lead Channel Pacing Threshold Pulse Width: 0.4 ms
Lead Channel Pacing Threshold Pulse Width: 0.4 ms
Lead Channel Sensing Intrinsic Amplitude: 2.4 mV
Lead Channel Setting Pacing Amplitude: 1.375
Lead Channel Setting Pacing Amplitude: 5 V
Lead Channel Setting Pacing Pulse Width: 0.4 ms
Lead Channel Setting Sensing Sensitivity: 4 mV
Pulse Gen Model: 2272
Pulse Gen Serial Number: 8932617

## 2020-09-12 NOTE — Telephone Encounter (Signed)
Thank you. Please let us know if this worsens, but no change in treatment plan at this time

## 2020-09-12 NOTE — Telephone Encounter (Signed)
Ventricular auto capture currently in high output mode (5.0V) - VP >99%. Had patient resend manual transmission and LV output was back to 1.0V.   During our conversation patient reports this morning she had an intermittent achy feeling over the left side of her chest. Patient reports she has not experienced this before but does not feel like it is very concerning to her. Denies chest pain at this time or shortness of breath. Manual transmission received. No alerts/episodes logged on device. Presenting rhythm AP/VP 92 bpm.  Advised patient I will send this to Dr. Sallyanne Kuster to see what his thoughts are. Patient states at this time she does not want to go to ED. Advised patient if her chest pain worsens or comes back and does not go away she NEEDS to go to the ED. Voiced understanding.

## 2020-09-12 NOTE — Telephone Encounter (Signed)
Called patient and advised with verbal understanding.

## 2020-09-23 NOTE — Progress Notes (Signed)
Remote pacemaker transmission.   

## 2020-09-25 ENCOUNTER — Other Ambulatory Visit: Payer: Self-pay | Admitting: Cardiovascular Disease

## 2020-09-29 ENCOUNTER — Telehealth: Payer: Self-pay | Admitting: Emergency Medicine

## 2020-09-29 NOTE — Telephone Encounter (Signed)
Patient scheduled for device clinic appointment to fix RV outputs that are in high ouput mode. She reports she will call if ahe cant  Keep appointment.

## 2020-10-06 NOTE — Telephone Encounter (Signed)
Pt was scheduled for Device clinic appt to turn off RV Auto Capture.  On 3/24, device went into High output mode, it is not clear why since the threshold result was 0.875V@ 0.15ms on that day.  A follow-up report received on 3/25 shows output aut adjusted to 1V@ 0.83ms.  Pt is >99% VP.    Forwarding to MD to determine if Device clinic appt is necessary.    3/24 Results:    3/25 Results:

## 2020-10-06 NOTE — Telephone Encounter (Signed)
I would turn autocapture off and program fixed output, please

## 2020-10-06 NOTE — Telephone Encounter (Signed)
Patient is following up regarding 10/07/20 appointment to fix RV outputs. She is requesting to speak with clinical staff to ensure that Dr. Sallyanne Kuster agrees with this. She states she normally sees Dr. Sallyanne Kuster, so knowing he agrees with this appointment will give her peace of mind.

## 2020-10-06 NOTE — Telephone Encounter (Signed)
Spoke woth pt advised Per Dr. Loletha Grayer, Turn off autocapture and program RV lead to fixed output.    Pt agreeable and will keep appt in device clinic tomorrow.

## 2020-10-07 ENCOUNTER — Ambulatory Visit (INDEPENDENT_AMBULATORY_CARE_PROVIDER_SITE_OTHER): Payer: Medicare Other | Admitting: Emergency Medicine

## 2020-10-07 ENCOUNTER — Other Ambulatory Visit: Payer: Self-pay

## 2020-10-07 DIAGNOSIS — I442 Atrioventricular block, complete: Secondary | ICD-10-CM

## 2020-10-07 LAB — CUP PACEART INCLINIC DEVICE CHECK
Battery Remaining Longevity: 103 mo
Battery Voltage: 2.99 V
Brady Statistic RA Percent Paced: 81 %
Brady Statistic RV Percent Paced: 99.9 %
Date Time Interrogation Session: 20220419091019
Implantable Lead Implant Date: 20090601
Implantable Lead Implant Date: 20090605
Implantable Lead Location: 753859
Implantable Lead Location: 753860
Implantable Pulse Generator Implant Date: 20180822
Lead Channel Impedance Value: 437.5 Ohm
Lead Channel Impedance Value: 612.5 Ohm
Lead Channel Pacing Threshold Amplitude: 0.375 V
Lead Channel Pacing Threshold Amplitude: 1 V
Lead Channel Pacing Threshold Pulse Width: 0.4 ms
Lead Channel Pacing Threshold Pulse Width: 0.4 ms
Lead Channel Sensing Intrinsic Amplitude: 4.8 mV
Lead Channel Setting Pacing Amplitude: 1.375
Lead Channel Setting Pacing Amplitude: 2.5 V
Lead Channel Setting Pacing Pulse Width: 0.4 ms
Lead Channel Setting Sensing Sensitivity: 4 mV
Pulse Gen Model: 2272
Pulse Gen Serial Number: 8932617

## 2020-10-07 NOTE — Progress Notes (Signed)
Thank you :)

## 2020-10-07 NOTE — Progress Notes (Signed)
Pacemaker check in clinic to turn off autocapture in RV lead secondary to previous high output mode of unknown etiology. RV output set to 2.5V. Normal device function today. Thresholds, sensing, impedances consistent with previous measurements. Device programmed to maximize longevity. No mode switch or high ventricular rates noted. Device programmed at appropriate safety margins. Histogram distribution appropriate for patient activity level. Estimated longevity 8.6 to 9.3 years. Patient enrolled in remote follow-ups with next transmission 12/11/20. Patient education completed.

## 2020-10-13 DIAGNOSIS — M1712 Unilateral primary osteoarthritis, left knee: Secondary | ICD-10-CM | POA: Diagnosis not present

## 2020-10-13 DIAGNOSIS — S76012A Strain of muscle, fascia and tendon of left hip, initial encounter: Secondary | ICD-10-CM | POA: Diagnosis not present

## 2020-10-27 ENCOUNTER — Telehealth: Payer: Self-pay | Admitting: Cardiovascular Disease

## 2020-10-27 NOTE — Telephone Encounter (Signed)
Spoke with pt regarding leg and feet swelling. Pt states that on 10/12/20 she got up in the middle of the night to use the restroom and heard a pop in her ankle. Since then pt has had issues with walking as well as swelling in the ankle and foot. Pt states that she was seen by the orthopedic specialist who told her that it was arthritis and did x-rays that can back negative for break. Pt states that she does not like to sit much and prefers to be up about as much as possible. Pt states that her swelling is making it harder for her to be up and around the house. Pt states that she has been taking her PRN lasix for about 3 days now and is not seeing much improvement. Pt states that she doesn't have compression stockings and does not typically sit with her legs propped up. Pt is not complaining of shortness of breath or any other issues at this time. Explained to pt that going back to the orthopedic office may be the best option but would still send a message over for Dr. Loletha Grayer to advise. Pt verbalized understanding.

## 2020-10-27 NOTE — Telephone Encounter (Signed)
    Pt c/o swelling: STAT is pt has developed SOB within 24 hours  1) How much weight have you gained and in what time span? Up 4 lbs since last time she weight  2) If swelling, where is the swelling located? Legs and ankles  3) Are you currently taking a fluid pill? Yes  4) Are you currently SOB? No  5) Do you have a log of your daily weights (if so, list)? not  6) Have you gained 3 pounds in a day or 5 pounds in a week? 4 lbs  7) Have you traveled recently? No  Pt said her legs and ankles are swelling really bad, she said she can't walk because of it, she haven't weight her self today but she said she gained 4 lbs from the last time she weight herself, she denied SOB. She wanted to know what Dr. Loletha Grayer recommendations

## 2020-10-27 NOTE — Telephone Encounter (Signed)
No answer ,  Will place message back to be called later

## 2020-10-27 NOTE — Telephone Encounter (Signed)
Patient returning call.

## 2020-10-28 NOTE — Telephone Encounter (Signed)
Please clarify for me if the swelling is bilateral or unilateral. On both sides, its not likely to be related to an orthopedic problem, could be related to her chronic treatment with diltiazem.  If so I would give her furosemide 20 mg to take for 2 days and then use only as needed (can call her and 15 tabs so she has for future episodes as well). If the swelling is unilateral, then it is possible that she had a Baker's cyst, less likely a DVT. I would schedule a lower extremity venous Doppler ultrasound.

## 2020-10-28 NOTE — Telephone Encounter (Signed)
Returned the call to the patient. She stated that she has been having bilateral lower extermity edema that is worse on the right side. This is the side that she hurt. She went to the orthopedic and was diagnosed with arthritis. She is also having hip pain.  She has already been taking the Furosemide 20 mg daily with no results. She denies shortness of breath.   She will be calling the orthopedic as well.

## 2020-10-29 DIAGNOSIS — M545 Low back pain, unspecified: Secondary | ICD-10-CM | POA: Diagnosis not present

## 2020-10-29 DIAGNOSIS — M25552 Pain in left hip: Secondary | ICD-10-CM | POA: Diagnosis not present

## 2020-10-29 DIAGNOSIS — M25562 Pain in left knee: Secondary | ICD-10-CM | POA: Diagnosis not present

## 2020-11-07 ENCOUNTER — Other Ambulatory Visit: Payer: Self-pay | Admitting: Gastroenterology

## 2020-11-07 DIAGNOSIS — K219 Gastro-esophageal reflux disease without esophagitis: Secondary | ICD-10-CM

## 2020-11-07 DIAGNOSIS — R194 Change in bowel habit: Secondary | ICD-10-CM | POA: Diagnosis not present

## 2020-11-28 ENCOUNTER — Ambulatory Visit
Admission: RE | Admit: 2020-11-28 | Discharge: 2020-11-28 | Disposition: A | Discharge status: Home / Self Care | Payer: Medicare Other | Source: Ambulatory Visit | Attending: Gastroenterology | Admitting: Gastroenterology

## 2020-11-28 DIAGNOSIS — K219 Gastro-esophageal reflux disease without esophagitis: Secondary | ICD-10-CM

## 2020-12-04 DIAGNOSIS — M533 Sacrococcygeal disorders, not elsewhere classified: Secondary | ICD-10-CM | POA: Diagnosis not present

## 2020-12-04 DIAGNOSIS — M5136 Other intervertebral disc degeneration, lumbar region: Secondary | ICD-10-CM | POA: Diagnosis not present

## 2020-12-11 ENCOUNTER — Ambulatory Visit: Payer: Medicare Other

## 2020-12-23 DIAGNOSIS — M545 Low back pain, unspecified: Secondary | ICD-10-CM | POA: Diagnosis not present

## 2020-12-23 DIAGNOSIS — M6281 Muscle weakness (generalized): Secondary | ICD-10-CM | POA: Diagnosis not present

## 2021-01-03 ENCOUNTER — Other Ambulatory Visit: Payer: Self-pay | Admitting: Cardiovascular Disease

## 2021-01-08 DIAGNOSIS — M25561 Pain in right knee: Secondary | ICD-10-CM | POA: Diagnosis not present

## 2021-01-08 DIAGNOSIS — M25562 Pain in left knee: Secondary | ICD-10-CM | POA: Diagnosis not present

## 2021-01-30 DIAGNOSIS — M5416 Radiculopathy, lumbar region: Secondary | ICD-10-CM | POA: Diagnosis not present

## 2021-02-13 ENCOUNTER — Encounter: Payer: Self-pay | Admitting: Cardiovascular Disease

## 2021-02-13 ENCOUNTER — Other Ambulatory Visit: Payer: Self-pay

## 2021-02-13 ENCOUNTER — Ambulatory Visit (INDEPENDENT_AMBULATORY_CARE_PROVIDER_SITE_OTHER): Payer: Medicare Other | Admitting: Cardiovascular Disease

## 2021-02-13 DIAGNOSIS — E7800 Pure hypercholesterolemia, unspecified: Secondary | ICD-10-CM

## 2021-02-13 DIAGNOSIS — R600 Localized edema: Secondary | ICD-10-CM

## 2021-02-13 DIAGNOSIS — Z950 Presence of cardiac pacemaker: Secondary | ICD-10-CM | POA: Diagnosis not present

## 2021-02-13 DIAGNOSIS — I495 Sick sinus syndrome: Secondary | ICD-10-CM | POA: Diagnosis not present

## 2021-02-13 DIAGNOSIS — I471 Supraventricular tachycardia: Secondary | ICD-10-CM | POA: Diagnosis not present

## 2021-02-13 DIAGNOSIS — I10 Essential (primary) hypertension: Secondary | ICD-10-CM | POA: Diagnosis not present

## 2021-02-13 DIAGNOSIS — I480 Paroxysmal atrial fibrillation: Secondary | ICD-10-CM | POA: Diagnosis not present

## 2021-02-13 NOTE — Patient Instructions (Signed)
Medication Instructions:  No changes *If you need a refill on your cardiac medications before your next appointment, please call your pharmacy*   Lab Work: Not needed    Testing/Procedures: Not needed   Follow-Up: At [facility], you and your health needs are our priority.  As part of our continuing mission to provide you with exceptional heart care, we have created designated Provider Care Teams.  These Care Teams include your primary Cardiologist (physician) and Advanced Practice Providers (APPs -  Physician Assistants and Nurse Practitioners) who all work together to provide you with the care you need, when you need it.  We recommend signing up for the patient portal called "MyChart".  Sign up information is provided on this After Visit Summary.  MyChart is used to connect with patients for Virtual Visits (Telemedicine).  Patients are able to view lab/test results, encounter notes, upcoming appointments, etc.  Non-urgent messages can be sent to your provider as well.   To learn more about what you can do with MyChart, go to [email or web address].    Your next appointment:   12 month(s)  The format for your next appointment:   In Person  Provider:   Shelva Majestic, MD

## 2021-02-13 NOTE — Progress Notes (Signed)
Cardiology Office Note    Date:  02/15/2021   ID:  [name], DOB 1932-02-05, MRN [id number]  PCP:  Lorne Skeens, MD  Cardiologist:  Shelva Majestic, MD   9 month F/U evaluation  History of Present Illness:  [name] is a 87 y.o. female  who underwent dual-chamber pacemaker implantation for complete heart block. [name] has a history of paroxysmal atrial fibrillation, a history of lower extremity edema as well as GERD. [name] also has a history of hypertension as well as hyperlipidemia.   A follow-up echo Doppler study on 10/23/2014 revealed an ejection fraction in the 50-55% range with grade 1 diastolic dysfunction and mild mitral regurgitation. Her last pacemaker interrogation was in June 2017 by Dr. Sallyanne Kuster.  At that time, [name] had normal pacemaker function and had 97% atrial pacing and 100% ventricular pacing.  There was no underlying escape rhythm.  Estimated longevity was 1.75-2.25 years.   Typically [name] goes to bed at midnight and wakes up at 6 AM.  [name] experiences a dry cough and is unaware of any postnasal drip.  [name] has a history of GERD.  [name] denies recent wheezing. [name] denies presyncope or syncope. At times [name] admits to intermittent leg swelling. [name] has a history of hypothyroidism on thyroid replacement.    [name] sees Dr. Elyse Hsu for primary and endocrinologic care and saw him on 08/03/2016.  He is now part of the [place].  Her recent lipid study on pravastatin from February 2018 showed a total cholesterol 137, triglycerides 89, HDL 61, and LDL 69.; Hemoglobin A1c was 5.8.  TSH was 3.2.  Vitamin D level was 42.    [name] underwent a dual pacemaker generator changeout on 02/09/2017  after her battery reached ERI. [name] has had an evaluation for reflux related laryngitis/cough by Dr. Constance Holster.  [name] had remotely been on allergy shots but stopped in early 2018.   I saw her in October 2018, [name] has done well and has been undergoing every 19-monthpacemaker checks.   Unfortunately, [name] had broken her second metatarsal of her right foot in November 2019 and required wearing a boot for some time.  Her ambulation has been somewhat difficult since and [name] does use a cane for ambulation.  [name] has difficulty walking, and when doing so admits to leg fatigue and some hip discomfort.  [name] denies any exertional shortness of breath or chest tightness.  [name] is unaware of recent palpitations on her current therapy which includes diltiazem 180 mg, metoprolol succinate 25 mg daily and in addition for blood pressure control [name] also is on valsartan HCT 160/25 mg at times [name] does admit to some occasional ankle swelling at the end of the day but this is normalized in the morning.  [name] admits to sleeping well.  [name] is unaware of any recurrent episodes of atrial fibrillation.  [name] continues to be on pravastatin for hyperlipidemia.  I reviewed the medical records of Dr. ALetitia Neri  When [name]  saw Dr. AElyse Hsuher blood pressure on July 03, 2018 was 118/50.  Her LDL cholesterol at that time was 85 in December 2019 which had slightly improved from 3 months previously when her LDL was 54.   I evaluated her with a telemedicine visit on September 22, 2018.  At that time [name] was on valsartan HCT 160/25, diltiazem CD 1 8 mg and metoprolol succinate 25 mg.  Her blood pressure was elevated and I had suggested that [name] continue to monitor her  blood pressure and that if additional blood pressure corneas were consistently elevated medication adjustment were necessary.   [name] was evaluated by Dr. Sallyanne Kuster in January 26, 2019 and her pacemaker was stable but [name] is pacemaker dependent and did not have any underlying escape rhythm.  Blood pressure was slightly elevated at 146/65.    I last saw her in September 2020 at which time [name] denied any exertional chest pain or shortness of breath.  [name] did experience mild nonexertional left-sided discomfort and also admitted to some fatigue.  [name] typically was going to bed  around 11:30 AM.  [name] felt that [name] was sleeping well.  [name] has GERD and was scheduled to see Dr. May got in follow-up.  [name] saw Dr. Sallyanne Kuster on July 30, 2019.  [name] was pacemaker dependent without underlying escape rhythm.  [name] still had symptoms suggestive of chronotropic incompetence and her pacemaker was reprogrammed.  [name] had not had any recorded atrial fibrillation in years.  I last saw her in March 2021 at which time [name] felt well and denied any anginal type chest pain.  [name] had experienced some mild intermittent leg swelling.    At times [name] notes some chest sensation nocturnally which sounded more GERD than ischemic.  [name]  continued to be on valsartan HCT 160/25 mg, Toprol-XL 25 mg and diltiazem 180 mg daily.  [name] had been taking pantoprazole in the morning.  [name] has hypothyroidism on levothyroxine.  [name] continued to take pravastatin 40 mg daily.  [name] was maintaining sinus rhythm.  I last saw her on May 06, 2020.  Since her prior evaluation [name] continued to do well but had some back problems.  Occasionally [name] uses a cane to assist in walking.  [name] denies any chest pain.  [name] is scheduled to see Dr. Sallyanne Kuster in February for follow-up pacemaker.  [name] continues to be seen by Dr. Eden Emms who checks laboratory.  Most recent lipid studies in July 2021 showed total cholesterol 127, triglycerides 94, HDL 56 and LDL 64.  Hemoglobin A1c was 6.0.  Since I last saw her, Dr. Eden Emms has retired.  [name] denies any chest pain or shortness of breath.  At times [name] has noted some mild left leg edema. [name] continues to be on furosemide 20 mg and has been taking this only 1 time per [name].  [name] is on diltiazem 180 mg, metoprolol 25 mg daily and valsartan HCT 160/25 mg for hypertension.  [name] is on low-dose levothyroxine at 25 mcg for hypothyroidism.  [name] continues to be on pravastatin 40 mg daily and pantoprazole 40 mg for GERD.  [name] presents for evaluation.   Past Medical History:  Diagnosis Date    Allergic rhinitis    Anginal pain (HCC)    Asthmatic bronchitis    Chronic cough    Dysrhythmia    PAF   Esophageal reflux    GERD (gastroesophageal reflux disease)    History of stress test 11/20/2009   This was essentially normal,post-stress EF was hyperdynamic at 73 beats per mins, There was no scar or ischemia.   Hx of echocardiogram 11/05/2010   EF>55% showed a normal systolic function with impaired diastolic dysfuntion and also tissue doppler suggesting increased elevated left atrial pressure, [name] had moderate LA dilatation and mild LA dilatation. There was calcified mitral apparatus with moderate MR, mild to moderate TR and trace aortic insufficiency.   Hyperlipemia    Hypertension    Hypothyroidism    PAF (paroxysmal atrial fibrillation) ([place])  Pneumonia     Past Surgical History:  Procedure Laterality Date   CHOLECYSTECTOMY     CYSTOSCOPY WITH BIOPSY N/A 08/29/2020   Procedure: CYSTOSCOPY WITH BIOPSY WITH FULGURATION;  Surgeon: Irine Seal, MD;  Location: [facility];  Service: Urology;  Laterality: N/A;   FOOT SURGERY     right   NASAL SEPTUM SURGERY     PACEMAKER INSERTION  June 1st 2009   St Jude [place] device, model #5826, serial #[id number], The artial lead is a Interior and spatial designer, the ventricular lead is Environmental education officer.   PPM GENERATOR CHANGEOUT N/A 02/09/2017   Procedure: PPM GENERATOR CHANGEOUT;  Surgeon: Sanda Klein, MD;  Location: [facility];  Service: Cardiovascular;  Laterality: N/A;   THYROID SURGERY     TONSILLECTOMY     TONSILLECTOMY     VESICOVAGINAL FISTULA CLOSURE W/ TAH      Current Medications: Outpatient Medications Prior to Visit  Medication Sig Dispense Refill   aspirin 81 MG tablet Take 81 mg by mouth at bedtime.     calcium carbonate (TUMS EX) 750 MG chewable tablet Chew 1 tablet by mouth daily as needed for heartburn.     Cholecalciferol 25 MCG (1000 UT) tablet Take 1,000 Units by mouth daily.     diltiazem (CARDIZEM CD) 180 MG 24 hr  capsule TAKE 1 CAPSULE BY MOUTH EVERY DAY (Patient taking differently: Take 180 mg by mouth daily. TAKE 1 CAPSULE BY MOUTH EVERY DAY) 90 capsule 3   furosemide (LASIX) 20 MG tablet TAKE 1 TABLET (20 MG TOTAL) BY MOUTH AS NEEDED. FOR LEG SWELLING 30 tablet 2   LORazepam (ATIVAN) 0.5 MG tablet Take 0.5 mg by mouth daily as needed for anxiety.     metoprolol succinate (TOPROL-XL) 25 MG 24 hr tablet Take 1 tablet (25 mg total) by mouth daily. 90 tablet 3   pantoprazole (PROTONIX) 40 MG tablet Take 80 mg by mouth daily. Takes 2 tablets daily     pravastatin (PRAVACHOL) 40 MG tablet TAKE 1 TABLET BY MOUTH EVERY DAY 90 tablet 3   SYNTHROID 25 MCG tablet Take 25 mcg by mouth daily before breakfast.   12   valsartan-hydrochlorothiazide (DIOVAN-HCT) 160-25 MG tablet TAKE 1 TABLET BY MOUTH EVERY DAY (Patient taking differently: Take 1 tablet by mouth daily.) 90 tablet 3   No facility-administered medications prior to visit.     Allergies:   Fluticasone-salmeterol, Sulfonamide derivatives, Benzonatate, Erythromycin, Meloxicam, and Sulfamethoxazole   Social History   Socioeconomic History   Marital status: Widowed    Spouse name: Not on file   Number of children: Not on file   Years of education: Not on file   Highest education level: Not on file  Occupational History    Employer: RETIRED    Comment: Retired Sales promotion account executive at [employer]  Tobacco Use   Smoking status: Never   Smokeless tobacco: Never  Vaping Use   Vaping Use: Never used  Substance and Sexual Activity   Alcohol use: No   Drug use: No   Sexual activity: Not on file  Other Topics Concern   Not on file  Social History Narrative   Not on file   Social Determinants of Health   Financial Resource Strain: Not on file  Food Insecurity: Not on file  Transportation Needs: Not on file  Physical Activity: Not on file  Stress: Not on file  Social Connections: Not on file     Family History:  The patient's family  history includes Cancer in her father; Stroke in her mother and sister.   ROS General: Negative; No fevers, chills, or night sweats; patient will fatigue HEENT: Negative; No changes in vision or hearing, sinus congestion, difficulty swallowing Pulmonary: Negative; No cough, wheezing, shortness of breath, hemoptysis Cardiovascular: Occasional mild nonexertional left-sided chest sensation GI: GERD GU: Negative; No dysuria, hematuria, or difficulty voiding Musculoskeletal: Back and hip discomfort Hematologic/Oncology: Negative; no easy bruising, bleeding Endocrine: Negative; no heat/cold intolerance; no diabetes Neuro: Negative; no changes in balance, headaches Skin: Negative; No rashes or skin lesions Psychiatric: Negative; No behavioral problems, depression Sleep: Negative; No snoring, daytime sleepiness, hypersomnolence, bruxism, restless legs, hypnogognic hallucinations, no cataplexy Other comprehensive 14 point system review is negative.   PHYSICAL EXAM:   VS:  BP 120/60 (BP Location: Right Arm)   Pulse 70   Ht _0  (1.499 m)   Wt 159 lb (72.1 kg)   SpO2 97%   BMI 32.11 kg/m     Repeat blood pressure by me was 124/66  Wt Readings from Last 3 Encounters:  02/13/21 159 lb (72.1 kg)  08/29/20 157 lb (71.2 kg)  08/26/20 157 lb (71.2 kg)   General: Alert, oriented, no distress.  Skin: normal turgor, no rashes, warm and dry HEENT: Normocephalic, atraumatic. Pupils equal round and reactive to light; sclera anicteric; extraocular muscles intact;  Nose without nasal septal hypertrophy Mouth/Parynx benign; Mallinpatti scale 3 Neck: No JVD, no carotid bruits; normal carotid upstroke Lungs: clear to ausculatation and percussion; no wheezing or rales Chest wall: without tenderness to palpitation Heart: PMI not displaced, RRR, s1 s2 normal, 1/6 systolic murmur, no diastolic murmur, no rubs, gallops, thrills, or heaves Abdomen: soft, nontender; no hepatosplenomehaly, BS+; abdominal  aorta nontender and not dilated by palpation. Back: no CVA tenderness Pulses 2+ Musculoskeletal: full range of motion, normal strength, no joint deformities Extremities: no clubbing cyanosis or edema, Homan's sign negative  Neurologic: grossly nonfocal; Cranial nerves grossly wnl Psychologic: Normal mood and affect  Studies/Labs Reviewed:   ECG (independently read by me): AV paced rhythm at 70 with prolonged AV condiuction  March 2021 ECG (independently read by me): Atrially paced rhythm with prolonged AV conduction with PR interval 222 ms.  Left bundle branch block.  QTc interval 483 ms  September 2020 EKG:  EKG is ordered today.  ECG (independently read by me): AV paced rhythm at 70 bpm.  Prolonged AV conduction with PR interval at 222 ms.  October 2018 ECG (independently read by me): AV paced rhythm with a ventricular rate at 70 bpm.  PR interval 224 ms.  QTc interval 477 ms.   March 2018 ECG (independently read by me): AV paced rhythm at 70 bpm with prolonged AV conduction with a PR interval of 222 ms.  QTc interval 490 ms.   October 2017 ECG (independently read by me): AV paced rhythm with 100% pacing of both chambers.  PR interval 226 ms.     December 2016 ECG (independently read by me):  100% AV pacing with ventricular rate at 70 and prolonged AV conduction at 224 ms.   June 2016  ECG (independently read by me): AV sequentially paced rhythm with 100% capture.  Ventricular rate 70 beats per minute.  PR interval 224 ms.   August 2014 ECG: 80 paced rhythm at 70 beats per minute. PR interval 222 ms.    Recent Labs: BMP Latest Ref Rng & Units 08/26/2020 02/04/2017 10/16/2014  Glucose 70 - 99 mg/dL 125(H) 120(H) 120(H)  BUN 8 - 23 mg/dL 25(H) 18 18  Creatinine 0.44 - 1.00 mg/dL 1.17(H) 1.17(H) 1.14(H)  BUN/Creat Ratio 12 - 28 - 15 -  Sodium 135 - 145 mmol/L 138 138 136  Potassium 3.5 - 5.1 mmol/L 3.7 4.1 3.7  Chloride 98 - 111 mmol/L 104 99 99  CO2 22 - 32 mmol/L _0 Calcium  8.9 - 10.3 mg/dL 9.1 9.0 9.0     Hepatic Function Latest Ref Rng & Units 10/16/2014 11/28/2007 11/24/2007  Total Protein 6.0 - 8.3 g/dL 6.6 5.9(L) 5.8(L)  Albumin 3.5 - 5.2 g/dL 3.6 2.6(L) 2.8(L)  AST 0 - 37 U/L 24 21 77(H)  ALT 0 - 35 U/L 21 29 85(H)  Alk Phosphatase 39 - 117 U/L 72 61 75  Total Bilirubin 0.3 - 1.2 mg/dL 0.3 0.4 0.4    CBC Latest Ref Rng & Units 08/26/2020 02/04/2017 10/16/2014  WBC 4.0 - 10.5 K/uL 8.6 6.0 7.3  Hemoglobin 12.0 - 15.0 g/dL 12.1 11.4 11.0(L)  Hematocrit 36.0 - 46.0 % 37.5 34.4 33.3(L)  Platelets 150 - 400 K/uL 239 222 216   Lab Results  Component Value Date   MCV 92.4 08/26/2020   MCV 90 02/04/2017   MCV 91.0 10/16/2014   Lab Results  Component Value Date   TSH 3.166 Test methodology is 3rd generation TSH 11/24/2007   No results found for: HGBA1C   BNP No results found for: BNP  ProBNP    Component Value Date/Time   PROBNP 101.0 (H) 09/09/2014 1423     Lipid Panel     Component Value Date/Time   CHOL 136 03/13/2015 1436   TRIG 95 03/13/2015 1436   HDL 57 03/13/2015 1436   CHOLHDL 2.4 03/13/2015 1436   VLDL 19 03/13/2015 1436   LDLCALC 60 03/13/2015 1436     RADIOLOGY: No results found.    ASSESSMENT:    1. Essential hypertension   2. Paroxysmal atrial fibrillation (HCC)   3. Lower extremity edema   4. PAT (paroxysmal atrial tachycardia) ([place])   5. SSS (sick sinus syndrome) (HCC)   6. Pure hypercholesterolemia   7. Pacemaker       PLAN:  1.  Essential hypertension: Blood pressure today is controlled and on repeat by me was 124/66 on a regimen of diltiazem 180 mg, metoprolol succinate 25 mg, and valsartan HCT 160/25 mg.  [name] has been taking furosemide 20 mg on a weekly basis.  2.  Underlying complete heart block/status post permanent pacemaker: Initial pacemaker insertion June 2009 with generator change out August 2018.  [name] is followed by Dr. Sallyanne Kuster and was last seen on August 20, 2020.  [name] continues to be pacemaker  dependent with no underlying escape rhythm.  Her heart rate histogram distribution is appropriate for lifestyle and no further adjustments were made.  ECG today shows AV paced rhythm at 70 bpm with prolonged AV conduction.  3. Atypical chest pain: Previously [name] had experienced nonischemic chest pain.  This has resolved.    4. Hyperlipidemia: [name] continues to be on pravastatin 40 mg.  Laboratory by Dr. Marcie Mowers July 2021 showed total cholesterol 121, LDL cholesterol 64 with triglycerides 94.  Dr. Darlyne Russian has retired.  [name] will be establishing with a new MD.  5.  History of PAF: [name] is maintaining sinus rhythm without recurrent AF.  6.  Leg edema: Leg edema improved with furosemide.  With occasional left leg swelling, I recommended [name] increase her Lasix from 1 time  per [name] to 2 times per [name]  7.  GERD: Controlled with pantoprazole  [name] continues to have remote pacemaker check next scheduled for September 22.  [name] will be establishing primary care with Dr. Billey Chang in April 27, 2021.  Medication Adjustments/Labs and Tests Ordered: Current medicines are reviewed at length with the patient today.  Concerns regarding medicines are outlined above.  Medication changes, Labs and Tests ordered today are listed in the Patient Instructions below. Patient Instructions  Medication Instructions:  No changes *If you need a refill on your cardiac medications before your next appointment, please call your pharmacy*   Lab Work: Not needed    Testing/Procedures: Not needed   Follow-Up: At [facility], you and your health needs are our priority.  As part of our continuing mission to provide you with exceptional heart care, we have created designated Provider Care Teams.  These Care Teams include your primary Cardiologist (physician) and Advanced Practice Providers (APPs -  Physician Assistants and Nurse Practitioners) who all work together to provide you with the care you need, when you  need it.  We recommend signing up for the patient portal called "MyChart".  Sign up information is provided on this After Visit Summary.  MyChart is used to connect with patients for Virtual Visits (Telemedicine).  Patients are able to view lab/test results, encounter notes, upcoming appointments, etc.  Non-urgent messages can be sent to your provider as well.   To learn more about what you can do with MyChart, go to [email or web address].    Your next appointment:   12 month(s)  The format for your next appointment:   In Person  Provider:   Shelva Majestic, MD      Signed, Shelva Majestic, MD  02/15/2021 9:31 PM    [facility] [address] [place], [place], [name]  [address] Phone: [phone]

## 2021-02-15 ENCOUNTER — Encounter: Payer: Self-pay | Admitting: Cardiovascular Disease

## 2021-02-17 LAB — CUP PACEART REMOTE DEVICE CHECK
Battery Remaining Longevity: 61 mo
Battery Remaining Percentage: 60 %
Battery Voltage: 2.99 V
Brady Statistic AP VP Percent: 89 %
Brady Statistic AP VS Percent: 1 %
Brady Statistic AS VP Percent: 11 %
Brady Statistic AS VS Percent: 1 %
Brady Statistic RA Percent Paced: 89 %
Brady Statistic RV Percent Paced: 99 %
Date Time Interrogation Session: 20220623040013
Implantable Lead Implant Date: 20090601
Implantable Lead Implant Date: 20090605
Implantable Lead Location: 753859
Implantable Lead Location: 753860
Implantable Pulse Generator Implant Date: 20180822
Lead Channel Impedance Value: 430 Ohm
Lead Channel Impedance Value: 550 Ohm
Lead Channel Pacing Threshold Amplitude: 0.375 V
Lead Channel Pacing Threshold Amplitude: 1 V
Lead Channel Pacing Threshold Pulse Width: 0.4 ms
Lead Channel Pacing Threshold Pulse Width: 0.4 ms
Lead Channel Sensing Intrinsic Amplitude: 2.6 mV
Lead Channel Setting Pacing Amplitude: 1.375
Lead Channel Setting Pacing Amplitude: 2.5 V
Lead Channel Setting Pacing Pulse Width: 0.4 ms
Lead Channel Setting Sensing Sensitivity: 4 mV
Pulse Gen Model: 2272
Pulse Gen Serial Number: 8932617

## 2021-03-12 ENCOUNTER — Ambulatory Visit (INDEPENDENT_AMBULATORY_CARE_PROVIDER_SITE_OTHER): Payer: Medicare Other

## 2021-03-12 DIAGNOSIS — I495 Sick sinus syndrome: Secondary | ICD-10-CM

## 2021-03-14 LAB — CUP PACEART REMOTE DEVICE CHECK
Battery Remaining Longevity: 57 mo
Battery Remaining Percentage: 57 %
Battery Voltage: 2.99 V
Brady Statistic AP VP Percent: 90 %
Brady Statistic AP VS Percent: 1 %
Brady Statistic AS VP Percent: 9.8 %
Brady Statistic AS VS Percent: 1 %
Brady Statistic RA Percent Paced: 90 %
Brady Statistic RV Percent Paced: 99 %
Date Time Interrogation Session: 20220923233346
Implantable Lead Implant Date: 20090601
Implantable Lead Implant Date: 20090605
Implantable Lead Location: 753859
Implantable Lead Location: 753860
Implantable Pulse Generator Implant Date: 20180822
Lead Channel Impedance Value: 400 Ohm
Lead Channel Impedance Value: 450 Ohm
Lead Channel Pacing Threshold Amplitude: 0.375 V
Lead Channel Pacing Threshold Amplitude: 1 V
Lead Channel Pacing Threshold Pulse Width: 0.4 ms
Lead Channel Pacing Threshold Pulse Width: 0.4 ms
Lead Channel Sensing Intrinsic Amplitude: 2.2 mV
Lead Channel Setting Pacing Amplitude: 1.375
Lead Channel Setting Pacing Amplitude: 2.5 V
Lead Channel Setting Pacing Pulse Width: 0.4 ms
Lead Channel Setting Sensing Sensitivity: 4 mV
Pulse Gen Model: 2272
Pulse Gen Serial Number: 8932617

## 2021-03-16 ENCOUNTER — Ambulatory Visit: Payer: Medicare Other | Admitting: Family Medicine

## 2021-03-19 NOTE — Progress Notes (Signed)
Remote pacemaker transmission.   

## 2021-03-29 DIAGNOSIS — Z23 Encounter for immunization: Secondary | ICD-10-CM | POA: Diagnosis not present

## 2021-04-13 DIAGNOSIS — M170 Bilateral primary osteoarthritis of knee: Secondary | ICD-10-CM | POA: Diagnosis not present

## 2021-04-14 ENCOUNTER — Telehealth: Payer: Self-pay

## 2021-04-14 NOTE — Telephone Encounter (Signed)
Pt called regarding a New Pt appt on 11/7.  Pt called stating that Dr Altheimer wants a cover sheet faxed over with all the records that Dr Jonni Sanger needs. Pt would like a call back. Please Advise.

## 2021-04-15 NOTE — Telephone Encounter (Signed)
All records? Please advise

## 2021-04-15 NOTE — Telephone Encounter (Signed)
Spoke with patient, let her know I will call and get their fax number to receive her records prior to her appointment

## 2021-04-27 ENCOUNTER — Encounter: Payer: Self-pay | Admitting: Family Medicine

## 2021-04-27 ENCOUNTER — Other Ambulatory Visit: Payer: Self-pay

## 2021-04-27 ENCOUNTER — Ambulatory Visit (INDEPENDENT_AMBULATORY_CARE_PROVIDER_SITE_OTHER): Payer: Medicare Other | Admitting: Family Medicine

## 2021-04-27 VITALS — BP 139/64 | HR 69 | Temp 98.6°F | Ht 59.0 in | Wt 157.6 lb

## 2021-04-27 DIAGNOSIS — N1831 Chronic kidney disease, stage 3a: Secondary | ICD-10-CM | POA: Insufficient documentation

## 2021-04-27 DIAGNOSIS — E039 Hypothyroidism, unspecified: Secondary | ICD-10-CM | POA: Diagnosis not present

## 2021-04-27 DIAGNOSIS — Z780 Asymptomatic menopausal state: Secondary | ICD-10-CM | POA: Diagnosis not present

## 2021-04-27 DIAGNOSIS — I10 Essential (primary) hypertension: Secondary | ICD-10-CM

## 2021-04-27 DIAGNOSIS — R7301 Impaired fasting glucose: Secondary | ICD-10-CM | POA: Diagnosis not present

## 2021-04-27 DIAGNOSIS — Z950 Presence of cardiac pacemaker: Secondary | ICD-10-CM | POA: Diagnosis not present

## 2021-04-27 DIAGNOSIS — F4322 Adjustment disorder with anxiety: Secondary | ICD-10-CM | POA: Insufficient documentation

## 2021-04-27 DIAGNOSIS — K219 Gastro-esophageal reflux disease without esophagitis: Secondary | ICD-10-CM | POA: Diagnosis not present

## 2021-04-27 DIAGNOSIS — E782 Mixed hyperlipidemia: Secondary | ICD-10-CM | POA: Diagnosis not present

## 2021-04-27 DIAGNOSIS — R600 Localized edema: Secondary | ICD-10-CM

## 2021-04-27 DIAGNOSIS — M8580 Other specified disorders of bone density and structure, unspecified site: Secondary | ICD-10-CM | POA: Insufficient documentation

## 2021-04-27 LAB — COMPREHENSIVE METABOLIC PANEL WITH GFR
ALT: 35 U/L (ref 0–35)
AST: 46 U/L — ABNORMAL HIGH (ref 0–37)
Albumin: 3.9 g/dL (ref 3.5–5.2)
Alkaline Phosphatase: 55 U/L (ref 39–117)
BUN: 40 mg/dL — ABNORMAL HIGH (ref 6–23)
CO2: 27 meq/L (ref 19–32)
Calcium: 8.9 mg/dL (ref 8.4–10.5)
Chloride: 101 meq/L (ref 96–112)
Creatinine, Ser: 1.21 mg/dL — ABNORMAL HIGH (ref 0.40–1.20)
GFR: 40.3 mL/min — ABNORMAL LOW
Glucose, Bld: 74 mg/dL (ref 70–99)
Potassium: 4 meq/L (ref 3.5–5.1)
Sodium: 137 meq/L (ref 135–145)
Total Bilirubin: 0.4 mg/dL (ref 0.2–1.2)
Total Protein: 7 g/dL (ref 6.0–8.3)

## 2021-04-27 LAB — CBC WITH DIFFERENTIAL/PLATELET
Basophils Absolute: 0 K/uL (ref 0.0–0.1)
Basophils Relative: 0.4 % (ref 0.0–3.0)
Eosinophils Absolute: 0.1 K/uL (ref 0.0–0.7)
Eosinophils Relative: 0.9 % (ref 0.0–5.0)
HCT: 37.4 % (ref 36.0–46.0)
Hemoglobin: 12.1 g/dL (ref 12.0–15.0)
Lymphocytes Relative: 12.6 % (ref 12.0–46.0)
Lymphs Abs: 1.4 K/uL (ref 0.7–4.0)
MCHC: 32.4 g/dL (ref 30.0–36.0)
MCV: 91.6 fl (ref 78.0–100.0)
Monocytes Absolute: 0.9 K/uL (ref 0.1–1.0)
Monocytes Relative: 7.6 % (ref 3.0–12.0)
Neutro Abs: 8.9 K/uL — ABNORMAL HIGH (ref 1.4–7.7)
Neutrophils Relative %: 78.5 % — ABNORMAL HIGH (ref 43.0–77.0)
Platelets: 230 K/uL (ref 150.0–400.0)
RBC: 4.09 Mil/uL (ref 3.87–5.11)
RDW: 13.3 % (ref 11.5–15.5)
WBC: 11.3 K/uL — ABNORMAL HIGH (ref 4.0–10.5)

## 2021-04-27 LAB — LIPID PANEL
Cholesterol: 144 mg/dL (ref 0–200)
HDL: 76.1 mg/dL (ref >39.00)
LDL Cholesterol: 57 mg/dL (ref 0–99)
NonHDL: 68.38
Total CHOL/HDL Ratio: 2
Triglycerides: 55 mg/dL (ref 0.0–149.0)
VLDL: 11 mg/dL (ref 0.0–40.0)

## 2021-04-27 LAB — TSH: TSH: 4.64 u[IU]/mL (ref 0.35–5.50)

## 2021-04-27 LAB — T4, FREE: Free T4: 0.95 ng/dL (ref 0.60–1.60)

## 2021-04-27 LAB — POCT GLYCOSYLATED HEMOGLOBIN (HGB A1C): Hemoglobin A1C: 5.4 % (ref 4.0–5.6)

## 2021-04-27 MED ORDER — LORAZEPAM 0.5 MG PO TABS: 0.5000 mg | ORAL_TABLET | Freq: Every day | ORAL | 1 refills | Status: DC | PRN

## 2021-04-27 NOTE — Patient Instructions (Addendum)
Please return in 3 months to recheck your thyroid.   We will call you with your lab results.  Please stop your thyroid medication and we will see if you really need it.  I have refilled your lorazepam. No other changes are recommended today.   I have ordered a mammogram and/or bone density for you as we discussed today: []   Mammogram  [x]   Bone Density  Please call the office checked below to schedule your appointment:  [x]   [facility]      [place], [place]         []   [place], [place]  It was a pleasure meeting you today! Thank you for choosing [place] to meet your healthcare needs! I truly look forward to working with you. If you have any questions or concerns, please send me a message via Mychart or call the office at [phone].   Please read the patient education material about Living [facility] and [place].    Both the Living Will and [place] require a Insurance account manager to [employer] on either document.  You may complete just one or both or neither of the documents. It is voluntaary. However, it can be very helpful if needed and I strongly encourage you to complete both forms stating your Advanced Directives. This will help me and your other healthcare providers care for you.  If you choose to complete either or both documents, please bring in or mail copies of the completed documents to my office so that I may put them in your medical chart. This way will ensure we have them if needed.

## 2021-04-27 NOTE — Progress Notes (Signed)
Subjective  CC:  Chief Complaint  Patient presents with   Hypertension   Hyperlipidemia   Establish Care    HPI: [name] is a 87 y.o. female who presents to [facility] [place] today to establish care with me as a new patient.   She has the following concerns or needs: Very pleasant 87 yo, widowed female who lives with her son.  Her PCP/endocrinologist recently retired.  Here to establish care.  She sees a cardiologist and gastroenterologist.  I reviewed multiple records from prior PCP, gastroenterology and cardiology.  Overall she does quite well.  No longer drives.  She does not have a living will or healthcare power of attorney.  She has 3 grown children.  Only one lives in the state. Past medical history is significant for hypertension, history of complete heart block status post pacemaker, history of A. fib and sinus atrial tach, she has hyperlipidemia and mildly impaired fasting glucose.  Chronic lower extremity edema is treated with intermittent Lasix.  She is also on low-dose levothyroxine.  She would like to come off of this as possible.  I do not see any abnormal TSH in her records.  She has a diagnosis of osteopenia, I reviewed her most recent bone density was back in 2011.  No history of compression or fragility fractures.  She has a history of GERD and LPR and chronic cough.  She has seen pulmonology.  She is on medications for her hypertension, edema, statin for cholesterol treatment, and chronic PPI.  She has no new complaints at this time. She requests a refill of her lorazepam.  She takes this rarely for anxiety reactions. Health maintenance: She is due bone density.  Immunizations are up-to-date.  She reports she had a recent flu shot.  Assessment  1. Primary hypertension   2. Impaired fasting glucose   3. Mixed hyperlipidemia   4. Lower extremity edema   5. Pacemaker   6. Primary hypothyroidism   7. Asymptomatic menopausal state   8.  Osteopenia, unspecified location   9. Gastroesophageal reflux disease without esophagitis   10. Stage 3a chronic kidney disease ([place])   11. Adjustment disorder with anxiety      Plan  Hypertension is well controlled.  She is on ARB/HCTZ, beta-blocker and calcium channel blocker.  Recheck renal function and electrolytes.  She does have mild chronic kidney disease. History of pacemaker for complete heart block: Normal function by recent cardiology evaluation. Hyperlipidemia on Pravachol.  LDL goal less than 80.  Tolerates well. Continue Lasix as needed for edema Will stop levothyroxine, and recheck TSH in 2 to 3 months.  Education given. Continue PPI for chronic cough and GERD Refill lorazepam.  Discussed risk versus benefits.  Long-term rare use. Impaired fasting glucose with normal point-of-care A1c here today. Health maintenance: Bone density to follow-up osteopenia ordered and discussed today.  Discussed advanced directives.  See after visit summary.  I spent a total of 52 minutes for this patient encounter. Time spent included preparation, face-to-face counseling with the patient and coordination of care, review of chart and records, and documentation of the encounter.  Follow up: 3 months to recheck thyroid Orders Placed This Encounter  Procedures   DG Bone Density   CBC with Differential/Platelet   Comprehensive metabolic panel   Lipid panel   TSH   T4, free   T3   POCT HgB A1C   Meds ordered this encounter  Medications   LORazepam (ATIVAN)  0.5 MG tablet    Sig: Take 1 tablet (0.5 mg total) by mouth daily as needed for anxiety.    Dispense:  30 tablet    Refill:  1     Depression screen PHQ 2/9 04/27/2021  Decreased Interest 0  Down, Depressed, Hopeless 0  PHQ - 2 Score 0  Some recent data might be hidden    We updated and reviewed the patient's past history in detail and it is documented below.  Patient Active Problem List   Diagnosis Date Noted   SSS (sick sinus  syndrome) ([place]) 06/15/2017    Priority: High   PAT (paroxysmal atrial tachycardia) ([place]) 06/15/2017    Priority: High   Impaired fasting glucose 07/27/2016    Priority: High   CHB (complete heart block) ([place]) 05/17/2013    Priority: High   Paroxysmal atrial fibrillation ([place]) 05/17/2013    Priority: High    Very brief episodes were recorded by her pacemaker in 2012 (less than 2 minutes) they have not recurred since. She is on aspirin alone for embolism prophylaxis    Pacemaker 02/04/2013    Priority: High    [employer] [place] model 5826 implanted June 2009, complete heart block, pacemaker dependent    Essential hypertension 02/04/2013    Priority: High   Mixed hyperlipidemia 02/04/2013    Priority: High   Early stage nonexudative age-related macular degeneration of right eye 03/06/2020    Priority: Medium    Early stage nonexudative age-related macular degeneration of left eye 03/06/2020    Priority: Medium    Laryngopharyngeal reflux (LPR) 03/11/2017    Priority: Medium    Primary hypothyroidism 07/27/2016    Priority: Medium    Lower extremity edema 11/22/2014    Priority: Medium    Asthma 02/20/2010    Priority: Medium     PFT 02/11/2010: FEV1 1.39/92%, FEV1/FVC 0.74 small airway flows improved 39% with bronchodilator, TLC 0.87, DLCO 0.64 PFT 12/02/2015-minimal obstruction, minimal restriction, minimal diffusion reduction, insignificant response to bronchodilator although small airway flows improved.    GERD (gastroesophageal reflux disease) 10/26/2007    Priority: Medium    Chronic cough 10/26/2007    Priority: Medium     PFT 02/11/2010 FEV1 1.39/92%, FEV1/FVC 0.74, small airway flows improved 39% bronchodilator, TLC 5.17, DLCO 6.16 Cyclical cough aggravated by reflux with asthma component PFT 12/02/2015-minimal obstruction, minimal restriction, minimal diffusion reduction, insignificant response to bronchodilator although small airway flows improved. Methacholine  challenge test 01/30/2016-positive for hyperreactive airways    Choroidal nevus of right eye 03/06/2020    Priority: Low   Pseudophakia 03/06/2020    Priority: Low   Vitamin D deficiency 07/27/2016    Priority: Low   Allergic rhinitis due to pollen 08/21/2010    Priority: Low    Allergy vaccine 1:50,000 03/24/2005; 1:500 04/27/2010 [facility]. Holding at 1:500, both vials, because of local reactions. DC'd 04/27/11/restarted 2013. Allergy profile 02/23/2011 IgE 14.1      Stage 3a chronic kidney disease ([place]) 04/27/2021   Osteopenia 04/27/2021   Adjustment disorder with anxiety 04/27/2021   Health Maintenance  Topic Date Due   TETANUS/TDAP  Never done   DEXA SCAN  04/01/2012   COVID-19 Vaccine (5 - Booster) 07/05/2020   INFLUENZA VACCINE  01/19/2021   Pneumonia Vaccine 26+ Years old  Completed   Zoster Vaccines- Shingrix  Completed   HPV VACCINES  Aged Out   Immunization History  Administered Date(s) Administered   Influenza Split 04/03/2012, 03/21/2013   Influenza Whole 02/19/2009,  02/17/2011   Influenza, High Dose Seasonal PF 03/08/2016, 03/09/2017, 03/23/2018   Influenza,inj,Quad PF,6+ Mos 03/11/2014, 03/13/2015   Pneumococcal Conjugate-13 03/14/2018   Pneumococcal Polysaccharide-23 06/22/2007   Zoster Recombinat (Shingrix) 08/23/2017, 11/03/2017   Current Meds  Medication Sig   aspirin 81 MG tablet Take 81 mg by mouth at bedtime.   diltiazem (CARDIZEM CD) 180 MG 24 hr capsule TAKE 1 CAPSULE BY MOUTH EVERY DAY (Patient taking differently: Take 180 mg by mouth daily. TAKE 1 CAPSULE BY MOUTH EVERY DAY)   furosemide (LASIX) 20 MG tablet TAKE 1 TABLET (20 MG TOTAL) BY MOUTH AS NEEDED. FOR LEG SWELLING   metoprolol succinate (TOPROL-XL) 25 MG 24 hr tablet Take 1 tablet (25 mg total) by mouth daily.   pantoprazole (PROTONIX) 40 MG tablet Take 80 mg by mouth daily. Takes 2 tablets daily   pravastatin (PRAVACHOL) 40 MG tablet TAKE 1 TABLET BY MOUTH EVERY DAY    valsartan-hydrochlorothiazide (DIOVAN-HCT) 160-25 MG tablet TAKE 1 TABLET BY MOUTH EVERY DAY (Patient taking differently: Take 1 tablet by mouth daily.)   [DISCONTINUED] LORazepam (ATIVAN) 0.5 MG tablet Take 0.5 mg by mouth daily as needed for anxiety.   [DISCONTINUED] SYNTHROID 25 MCG tablet Take 25 mcg by mouth daily before breakfast.     Allergies: Patient is allergic to fluticasone-salmeterol, sulfonamide derivatives, benzonatate, erythromycin, meloxicam, and sulfamethoxazole. Past Medical History Patient  has a past medical history of Allergic rhinitis, Anginal pain ([place]), Asthmatic bronchitis, Chronic cough, Dysrhythmia, Esophageal reflux, GERD (gastroesophageal reflux disease), History of stress test (11/20/2009), echocardiogram (11/05/2010), Hyperlipemia, Hypertension, Hypothyroidism, PAF (paroxysmal atrial fibrillation) ([place]), and Pneumonia. Past Surgical History Patient  has a past surgical history that includes Pacemaker insertion (June 1st 2009); Foot surgery; Thyroid surgery; Vesicovaginal fistula closure w/ TAH; Cholecystectomy; Tonsillectomy; Nasal septum surgery; PPM GENERATOR CHANGEOUT (N/A, 02/09/2017); Tonsillectomy; and Cystoscopy with biopsy (N/A, 08/29/2020). Family History: Patient family history includes Cancer in her father; Stroke in her mother and sister. Social History:  Patient  reports that she has never smoked. She has never used smokeless tobacco. She reports that she does not drink alcohol and does not use drugs.  Review of Systems: Constitutional: negative for fever or malaise Ophthalmic: negative for photophobia, double vision or loss of vision Cardiovascular: negative for chest pain, dyspnea on exertion, or new LE swelling Respiratory: negative for SOB or persistent cough Gastrointestinal: negative for abdominal pain, change in bowel habits or melena Genitourinary: negative for dysuria or gross hematuria Musculoskeletal: negative for new gait disturbance or  muscular weakness Integumentary: negative for new or persistent rashes Neurological: negative for TIA or stroke symptoms Psychiatric: negative for SI or delusions Allergic/Immunologic: negative for hives  Patient Care Team    Relationship Specialty Notifications Start End  Leamon Arnt, MD PCP - General Family Medicine  04/27/21   Troy Sine, MD PCP - Cardiology Cardiology  03/06/19   Sanda Klein, MD Consulting Physician Cardiology  06/10/17     Objective  Vitals: BP 139/64   Pulse 69   Temp 98.6 F (37 C) (Temporal)   Ht 4\' 11"  (1.499 m)   Wt 157 lb 9.6 oz (71.5 kg)   SpO2 100%   BMI 31.83 kg/m  General:  Well developed, well nourished, no acute distress  Psych:  Alert and oriented,normal mood and affect HEENT:  Normocephalic, atraumatic, non-icteric sclera, supple neck without adenopathy, mass or thyromegaly Cardiovascular:  RRR  Respiratory:  Good breath sounds bilaterally, CTAB with normal respiratory effort Gastrointestinal: normal bowel sounds, soft,  non-tender, no noted masses. No HSM Trace bilateral lower extremity edema Neurologic:    Mental status is normal.  Ambulates with cane [place] side effects, risks, benefits, and alternatives for medications and treatment plan prescribed today were discussed, and the patient expressed understanding of the given instructions. Patient is instructed to call or message via MyChart if he/she has any questions or concerns regarding our treatment plan. No barriers to understanding were identified. We discussed Red Flag symptoms and signs in detail. Patient expressed understanding regarding what to do in case of urgent or emergency type symptoms.  Medication list was reconciled, printed and provided to the patient in AVS. Patient instructions and summary information was reviewed with the patient as documented in the AVS. This note was prepared with assistance of Dragon voice recognition software. Occasional wrong-word or  sound-a-like substitutions may have occurred due to the inherent limitations of voice recognition software  This visit occurred during the SARS-CoV-2 public health emergency.  Safety protocols were in place, including screening questions prior to the visit, additional usage of staff PPE, and extensive cleaning of exam room while observing appropriate contact time as indicated for disinfecting solutions.

## 2021-04-28 LAB — T3: T3, Total: 78 ng/dL (ref 76–181)

## 2021-04-28 NOTE — Progress Notes (Signed)
Please call patient: I have reviewed his/her lab results. Lab work results are stable. Has mild chronic kidney disease, aovid nsaids (advil, alleve etc); thyroid tests are normal so may stop her levothyroxine as we discussed and I will recheck at her follow up. Cholesterol levels look great.  WBC is just above normal. I will recheck at next visit.

## 2021-05-18 ENCOUNTER — Other Ambulatory Visit: Payer: Self-pay | Admitting: Cardiovascular Disease

## 2021-05-21 DIAGNOSIS — M170 Bilateral primary osteoarthritis of knee: Secondary | ICD-10-CM | POA: Diagnosis not present

## 2021-05-21 DIAGNOSIS — M25561 Pain in right knee: Secondary | ICD-10-CM | POA: Diagnosis not present

## 2021-05-21 DIAGNOSIS — M25562 Pain in left knee: Secondary | ICD-10-CM | POA: Diagnosis not present

## 2021-05-28 DIAGNOSIS — M1712 Unilateral primary osteoarthritis, left knee: Secondary | ICD-10-CM | POA: Diagnosis not present

## 2021-05-28 DIAGNOSIS — M1711 Unilateral primary osteoarthritis, right knee: Secondary | ICD-10-CM | POA: Diagnosis not present

## 2021-05-28 DIAGNOSIS — M170 Bilateral primary osteoarthritis of knee: Secondary | ICD-10-CM | POA: Diagnosis not present

## 2021-06-01 ENCOUNTER — Other Ambulatory Visit: Payer: Self-pay | Admitting: Cardiovascular Disease

## 2021-06-01 ENCOUNTER — Telehealth: Payer: Self-pay | Admitting: Cardiovascular Disease

## 2021-06-01 NOTE — Telephone Encounter (Signed)
*  STAT* If patient is at the pharmacy, call can be transferred to refill team.   1. Which medications need to be refilled? (please list name of each medication and dose if known)  metoprolol succinate (TOPROL-XL) 25 MG 24 hr tablet  2. Which pharmacy/location (including street and city if local pharmacy) is medication to be sent to? [facility] [place], [place] - [address]  3. Do they need a 30 day or 90 day supply?  90 day supply

## 2021-06-02 ENCOUNTER — Telehealth: Payer: Self-pay | Admitting: Cardiovascular Disease

## 2021-06-02 NOTE — Telephone Encounter (Signed)
I spoke to the patient and she informed me it's being taking care of.

## 2021-06-02 NOTE — Telephone Encounter (Signed)
°*  STAT* If patient is at the pharmacy, call can be transferred to refill team.   1. Which medications need to be refilled? (please list name of each medication and dose if known) metoprolol succinate (TOPROL-XL) 25 MG 24 hr tablet  2. Which pharmacy/location (including street and city if local pharmacy) is medication to be sent to? [facility] [place], [place] - [address]  3. Do they need a 30 day or 90 day supply? 90   Patient state when you called the pharmacy earlier today they didn't have it. And patient is done to her last pill

## 2021-06-04 DIAGNOSIS — M1712 Unilateral primary osteoarthritis, left knee: Secondary | ICD-10-CM | POA: Diagnosis not present

## 2021-06-04 DIAGNOSIS — M1711 Unilateral primary osteoarthritis, right knee: Secondary | ICD-10-CM | POA: Diagnosis not present

## 2021-06-04 DIAGNOSIS — M170 Bilateral primary osteoarthritis of knee: Secondary | ICD-10-CM | POA: Diagnosis not present

## 2021-06-08 DIAGNOSIS — M5451 Vertebrogenic low back pain: Secondary | ICD-10-CM | POA: Diagnosis not present

## 2021-06-08 DIAGNOSIS — M25551 Pain in right hip: Secondary | ICD-10-CM | POA: Diagnosis not present

## 2021-06-18 DIAGNOSIS — M5416 Radiculopathy, lumbar region: Secondary | ICD-10-CM | POA: Diagnosis not present

## 2021-06-18 DIAGNOSIS — M5451 Vertebrogenic low back pain: Secondary | ICD-10-CM | POA: Diagnosis not present

## 2021-06-19 ENCOUNTER — Telehealth: Payer: Self-pay | Admitting: Cardiovascular Disease

## 2021-06-19 NOTE — Telephone Encounter (Signed)
Spoke to patient she stated she has been having swelling in both lower legs and feet for the past 4 to 5 days.No sob.Weight stable.She has been eating more salt.Advised to decrease salt in diet.Advised to double Lasix to 40 mg daily for the next 3 days then return to normal dose.Advised to keep appointment already scheduled with Dr.Croitoru 07/06/21 at 8:00 am.Advised to call sooner if needed.

## 2021-06-19 NOTE — Telephone Encounter (Signed)
Pt c/o swelling: STAT is pt has developed SOB within 24 hours  How much weight have you gained and in what time span?  Patient states she has not had any recent weight gain   If swelling, where is the swelling located?  Legs and feet  Are you currently taking a fluid pill?  Yes  Are you currently SOB?  No   Do you have a log of your daily weights (if so, list)?  No log available  Have you gained 3 pounds in a day or 5 pounds in a week?  No   Have you traveled recently?  No

## 2021-06-23 ENCOUNTER — Encounter: Payer: Self-pay | Admitting: Podiatry

## 2021-06-23 ENCOUNTER — Ambulatory Visit (INDEPENDENT_AMBULATORY_CARE_PROVIDER_SITE_OTHER): Payer: Medicare Other | Admitting: Podiatry

## 2021-06-23 ENCOUNTER — Other Ambulatory Visit: Payer: Self-pay

## 2021-06-23 DIAGNOSIS — M79674 Pain in right toe(s): Secondary | ICD-10-CM | POA: Diagnosis not present

## 2021-06-23 DIAGNOSIS — M79675 Pain in left toe(s): Secondary | ICD-10-CM | POA: Diagnosis not present

## 2021-06-23 DIAGNOSIS — B351 Tinea unguium: Secondary | ICD-10-CM

## 2021-06-27 NOTE — Progress Notes (Signed)
°  Subjective:  Patient ID: [name], female    DOB: [birth year],  MRN: [id number]  [name] presents to clinic today for painful elongated mycotic toenails 1-5 bilaterally which are tender when wearing enclosed shoe gear. Pain is relieved with periodic professional debridement.  Patient is accompanied by her son on today's visit.   PCP is Leamon Arnt, MD , and last visit was 04/27/2021.  Allergies  Allergen Reactions   Fluticasone-Salmeterol Other (See Comments)    unknown Other reaction(s): tongue/throat symptoms   Sulfa Antibiotics     Other reaction(s): gastric intol   Sulfonamide Derivatives Other (See Comments)    unknown   Benzonatate Other (See Comments)    tongue/throat symptoms in the past but tolerated more recently Other reaction(s): and tongue/throat symptoms in the past (not more recently)   Erythromycin Nausea And Vomiting and Other (See Comments)    Stomach cramps Other reaction(s): gastric intol   Meloxicam Other (See Comments)    Mild dyspnea Other reaction(s): dyspnea   Sulfamethoxazole Other (See Comments)    Gastric intolerance    Review of Systems: Negative except as noted in the HPI. Objective:   Constitutional [name] is a pleasant 88 y.o. Caucasian female, WD, WN in NAD. AAO x 3.   Vascular CFT immediate b/l LE. Palpable DP/PT pulses b/l LE. Digital hair sparse b/l. Skin temperature gradient WNL b/l. No pain with calf compression b/l. No edema noted b/l. No cyanosis or clubbing noted b/l LE.  Neurologic Normal speech. Oriented to person, place, and time. Protective sensation intact 5/5 intact bilaterally with 10g monofilament b/l. Vibratory sensation intact b/l.  Dermatologic No open wounds b/l LE. No interdigital macerations noted b/l LE. Toenails 1-5 b/l well maintained with adequate length. No erythema, no edema, no drainage, no fluctuance.  Orthopedic: Muscle strength 5/5 to all lower extremity muscle groups bilaterally. Plantarflexed  metatarsal(s) 5th metatarsal head right foot.   Radiographs: None  Last A1c:  Hemoglobin A1C Latest Ref Rng & Units 04/27/2021  HGBA1C 4.0 - 5.6 % 5.4  Some recent data might be hidden   Assessment:   1. Pain due to onychomycosis of toenails of both feet    Plan:  Patient was evaluated and treated and all questions answered. Consent given for treatment as described below: -Mycotic toenails 1-5 bilaterally were debrided in length and girth with sterile nail nippers and dremel without incident. -Patient/POA to call should there be question/concern in the interim.  Return in about 3 months (around 09/21/2021).  Marzetta Board, DPM

## 2021-06-30 DIAGNOSIS — M5416 Radiculopathy, lumbar region: Secondary | ICD-10-CM | POA: Diagnosis not present

## 2021-07-01 ENCOUNTER — Telehealth: Payer: Self-pay | Admitting: Cardiovascular Disease

## 2021-07-01 NOTE — Telephone Encounter (Signed)
Patient wanted to let the device clinic know that her home monitor stopped working. She contacted the company and got a new machine. Her son should be setting it up for her either today or tomorrow. The patient will send a manual transmission once it is set up.

## 2021-07-01 NOTE — Telephone Encounter (Signed)
Noted  

## 2021-07-06 ENCOUNTER — Encounter: Payer: Self-pay | Admitting: Cardiovascular Disease

## 2021-07-06 ENCOUNTER — Other Ambulatory Visit: Payer: Self-pay

## 2021-07-06 ENCOUNTER — Ambulatory Visit (INDEPENDENT_AMBULATORY_CARE_PROVIDER_SITE_OTHER): Payer: Medicare Other | Admitting: Cardiovascular Disease

## 2021-07-06 VITALS — BP 138/52 | HR 71 | Ht 59.0 in | Wt 159.0 lb

## 2021-07-06 DIAGNOSIS — Z8679 Personal history of other diseases of the circulatory system: Secondary | ICD-10-CM | POA: Diagnosis not present

## 2021-07-06 DIAGNOSIS — I471 Supraventricular tachycardia: Secondary | ICD-10-CM | POA: Diagnosis not present

## 2021-07-06 DIAGNOSIS — I442 Atrioventricular block, complete: Secondary | ICD-10-CM | POA: Diagnosis not present

## 2021-07-06 DIAGNOSIS — Z950 Presence of cardiac pacemaker: Secondary | ICD-10-CM

## 2021-07-06 DIAGNOSIS — E7800 Pure hypercholesterolemia, unspecified: Secondary | ICD-10-CM | POA: Diagnosis not present

## 2021-07-06 DIAGNOSIS — I495 Sick sinus syndrome: Secondary | ICD-10-CM

## 2021-07-06 DIAGNOSIS — R600 Localized edema: Secondary | ICD-10-CM

## 2021-07-06 DIAGNOSIS — I10 Essential (primary) hypertension: Secondary | ICD-10-CM

## 2021-07-06 MED ORDER — POTASSIUM CHLORIDE CRYS ER 20 MEQ PO TBCR: 20.0000 meq | EXTENDED_RELEASE_TABLET | Freq: Every day | ORAL | 2 refills | Status: DC

## 2021-07-06 MED ORDER — FUROSEMIDE 40 MG PO TABS: 40.0000 mg | ORAL_TABLET | Freq: Every day | ORAL | 2 refills | Status: DC

## 2021-07-06 NOTE — Progress Notes (Signed)
Patient ID: [name], female   DOB: [birth year], 88 y.o.   MRN: [id number]    Cardiology Office Note    Date:  07/07/2021   ID:  [name], DOB [birth year], MRN [id number]  PCP:  Leamon Arnt, MD  Cardiologist: Shelva Majestic, M.D.;  Sanda Klein, MD   Chief Complaint  Patient presents with   Pacemaker Check    History of Present Illness:  [name] is a 88 y.o. female who returns in follow-up for sinus node dysfunction, complete heart block, paroxysmal atrial fibrillation and pacemaker.    Her initial pacemaker was implanted for complete heart block in 2009.  She underwent pacemaker change out for ERI in August 2018 ([place] [facility], but leads are from 2009, not MRI conditional).  She has developed symptoms of sinus node dysfunction as well and has almost 100% atrial pacing.    She is in a wheelchair today.  She is having a lot of problems with pain in her groin that sounds like a hip problem.  She has received some back injections that did not really help with this pain.in fact, she has received 3 different steroid injections over the last 3-4 months.  She is not taking NSAIDs.  She is also having problems with edema involving both feet ankles and the bottom half of both calves.  She does not have shortness of breath, orthopnea, PND.  She has not been troubled by palpitations, dizziness or syncope.  She does not have any complaints of chest pain.  She has not had any falls or bleeding problems.  Pacemaker function is normal.  Her dual-chamber St. Jude Assurity device was implanted in 2018 and has about 4.4-4.7 years of remaining longevity.  She has 86% atrial pacing and greater than 99% ventricular pacing.  There have been only 3 episodes of atrial mode switch lasting for a maximum of 10 seconds, consistent with ectopic atrial tachycardia.  She has not had any true atrial fibrillation in the last year.    Past Medical History:  Diagnosis Date   Allergic rhinitis     Anginal pain (HCC)    Asthmatic bronchitis    Chronic cough    Dysrhythmia    PAF   Esophageal reflux    GERD (gastroesophageal reflux disease)    History of stress test 11/20/2009   This was essentially normal,post-stress EF was hyperdynamic at 73 beats per mins, There was no scar or ischemia.   Hx of echocardiogram 11/05/2010   EF>55% showed a normal systolic function with impaired diastolic dysfuntion and also tissue doppler suggesting increased elevated left atrial pressure, she had moderate LA dilatation and mild LA dilatation. There was calcified mitral apparatus with moderate MR, mild to moderate TR and trace aortic insufficiency.   Hyperlipemia    Hypertension    Hypothyroidism    PAF (paroxysmal atrial fibrillation) ([place])    Pneumonia     Past Surgical History:  Procedure Laterality Date   CHOLECYSTECTOMY     CYSTOSCOPY WITH BIOPSY N/A 08/29/2020   Procedure: CYSTOSCOPY WITH BIOPSY WITH FULGURATION;  Surgeon: Irine Seal, MD;  Location: [facility];  Service: Urology;  Laterality: N/A;   FOOT SURGERY     right   NASAL SEPTUM SURGERY     PACEMAKER INSERTION  June 1st 2009   St Jude [place] device, model #5826, serial #[id number], The artial lead is a Interior and spatial designer, the ventricular lead is Environmental education officer.   PPM GENERATOR CHANGEOUT N/A 02/09/2017  Procedure: PPM GENERATOR CHANGEOUT;  Surgeon: Sanda Klein, MD;  Location: [facility];  Service: Cardiovascular;  Laterality: N/A;   THYROID SURGERY     TONSILLECTOMY     TONSILLECTOMY     VESICOVAGINAL FISTULA CLOSURE W/ TAH      Current Outpatient Medications  Medication Sig Dispense Refill   Ascorbic Acid (VITAMIN C) 100 MG tablet 1 tablet     aspirin 81 MG chewable tablet 1 tablet     calcium carbonate (TUMS EX) 750 MG chewable tablet Chew 1 tablet by mouth daily as needed for heartburn.     cetirizine (ZYRTEC) 10 MG tablet 1 tablet     cholecalciferol (VITAMIN D3) 25 MCG (1000 UNIT) tablet 1 tablet     diltiazem  (CARDIZEM CD) 180 MG 24 hr capsule TAKE 1 CAPSULE BY MOUTH EVERY DAY (Patient taking differently: Take 180 mg by mouth daily. TAKE 1 CAPSULE BY MOUTH EVERY DAY) 90 capsule 3   esomeprazole (NEXIUM) 40 MG capsule 1 capsule     levothyroxine (SYNTHROID) 25 MCG tablet 1 tablet in the morning on an empty stomach     lidocaine (LIDODERM) 5 % Lidoderm 5 % topical patch  APPLY 1 PATCH BY TOPICAL ROUTE ONCE DAILY (MAY WEAR UP TO 12HOURS.)     LORazepam (ATIVAN) 0.5 MG tablet Take 1 tablet (0.5 mg total) by mouth daily as needed for anxiety. 30 tablet 1   metoprolol succinate (TOPROL-XL) 25 MG 24 hr tablet TAKE 1 TABLET (25 MG TOTAL) BY MOUTH DAILY. 90 tablet 3   pantoprazole (PROTONIX) 40 MG tablet Take 80 mg by mouth daily. Takes 2 tablets daily     potassium chloride SA (KLOR-CON M20) 20 MEQ tablet Take 1 tablet (20 mEq total) by mouth daily. 30 tablet 2   pravastatin (PRAVACHOL) 40 MG tablet 1 tablet     valsartan-hydrochlorothiazide (DIOVAN-HCT) 160-25 MG tablet TAKE 1 TABLET BY MOUTH EVERY DAY 90 tablet 3   furosemide (LASIX) 40 MG tablet Take 1 tablet (40 mg total) by mouth daily. 30 tablet 2   simvastatin (ZOCOR) 20 MG tablet 1/2 tablet (Patient not taking: Reported on 07/06/2021)     tiZANidine (ZANAFLEX) 2 MG tablet tizanidine 2 mg tablet  Take 1 tablet 3 times a day by oral route as needed. (Patient not taking: Reported on 07/06/2021)     No current facility-administered medications for this visit.    Allergies:   Fluticasone-salmeterol, Sulfa antibiotics, Sulfonamide derivatives, Benzonatate, Erythromycin, Meloxicam, and Sulfamethoxazole   Social History   Socioeconomic History   Marital status: Widowed    Spouse name: Not on file   Number of children: Not on file   Years of education: Not on file   Highest education level: Not on file  Occupational History    Employer: RETIRED    Comment: Retired Sales promotion account executive at [employer]  Tobacco Use   Smoking status: Never    Smokeless tobacco: Never  Vaping Use   Vaping Use: Never used  Substance and Sexual Activity   Alcohol use: No   Drug use: No   Sexual activity: Not on file  Other Topics Concern   Not on file  Social History Narrative   Not on file   Social Determinants of Health   Financial Resource Strain: Not on file  Food Insecurity: Not on file  Transportation Needs: Not on file  Physical Activity: Not on file  Stress: Not on file  Social Connections: Not on file  Family History:  The patient's family history includes Cancer in her father; Stroke in her mother and sister.   ROS:   Please see the history of present illness.    Review of Systems  Musculoskeletal:  Positive for arthritis, back pain, muscle cramps and stiffness.  All other systems reviewed and are negative.   PHYSICAL EXAM:   VS:  BP (!) 138/52 (BP Location: Left Arm, Patient Position: Sitting, Cuff Size: Large)    Pulse 71    Ht 4\' 11"  (1.499 m)    Wt 159 lb (72.1 kg)    SpO2 98%    BMI 32.11 kg/m      General: Alert, oriented x3, no distress, mildly obese.  Healthy left subclavian pacemaker site. Head: no evidence of trauma, PERRL, EOMI, no exophtalmos or lid lag, no myxedema, no xanthelasma; normal ears, nose and oropharynx Neck: normal jugular venous pulsations and no hepatojugular reflux; brisk carotid pulses without delay and no carotid bruits Chest: clear to auscultation, no signs of consolidation by percussion or palpation, normal fremitus, symmetrical and full respiratory excursions Cardiovascular: normal position and quality of the apical impulse, regular rhythm, normal first and paradoxically split second heart sounds, no murmurs, rubs or gallops Abdomen: no tenderness or distention, no masses by palpation, no abnormal pulsatility or arterial bruits, normal bowel sounds, no hepatosplenomegaly Extremities: no clubbing, cyanosis; 3+ symmetrical soft pitting edema of the feet ankles and lower half of the calves  bilaterally. Neurological: grossly nonfocal Psych: Normal mood and affect     Wt Readings from Last 3 Encounters:  07/06/21 159 lb (72.1 kg)  04/27/21 157 lb 9.6 oz (71.5 kg)  02/13/21 159 lb (72.1 kg)      Studies/Labs Reviewed:   ECHO 10/23/2014 - Left ventricle: The cavity size was normal. Wall thickness was   normal. Systolic function was normal. The estimated ejection   fraction was in the range of 50% to 55%. Wall motion was normal;   there were no regional wall motion abnormalities. Doppler  parameters are consistent with abnormal left ventricular  relaxation (grade 1 diastolic dysfunction).  - Mitral valve: There was mild regurgitation.   EKG:  EKG is ordered today and shows atrial paced, ventricular paced rhythm. Recent Labs: 04/27/2021: ALT 35; BUN 40; Creatinine, Ser 1.21; Hemoglobin 12.1; Platelets 230.0; Potassium 4.0; Sodium 137; TSH 4.64    Lipid Panel    Component Value Date/Time   CHOL 144 04/27/2021 1035   TRIG 55.0 04/27/2021 1035   HDL 76.10 04/27/2021 1035   CHOLHDL 2 04/27/2021 1035   VLDL 11.0 04/27/2021 1035   LDLCALC 57 04/27/2021 1035    ASSESSMENT:    1. CHB (complete heart block) ([place])   2. SSS (sick sinus syndrome) ([place])   3. Pacemaker   4. PAT (paroxysmal atrial tachycardia) ([place])   5. History of atrial fibrillation   6. Essential hypertension   7. Pure hypercholesterolemia   8. Lower extremity edema       PLAN:  In order of problems listed above:  CHB: Pacemaker dependent. No underlying escape rhythm. SSS: Has developed chronotropic incompetence after CHB. Appropriate sensor settings for activity level. PPM: normal device function.  Continue remote downloads every 3 months.   PAT: Very few and brief asymptomatic events.  On metoprolol and diltiazem for PAT and for HTN. AFib: Reported in the remote past, but has not been recorded by her pacemaker at least in the last 10 years.  Suspect this may have been erroneous diagnosis.  Not on  anticoagulation. HTN: controlled HLP: excellent parameters on current meds. Edema: may be due to increased salt/water retention after several steroid shots. Start furosemide and K supplement. Recheck labs in a few weeks. Recheck echo.  Medication Adjustments/Labs and Tests Ordered: Current medicines are reviewed at length with the patient today.  Concerns regarding medicines are outlined above.  Medication changes, Labs and Tests ordered today are listed below. Patient Instructions  Medication Instructions:  FUROSEMIDE: Take 40 mg once daily POTASSIUM: Take 20 mEq once daily  *If you need a refill on your cardiac medications before your next appointment, please call your pharmacy*   Lab Work: Your provider would like for you to return in one month at your next appointment  to have the following labs drawn: BMET and BNP. You do not need an appointment for the lab. Once in our office lobby there is a podium where you can sign in and ring the doorbell to alert [place] that you are here. The lab is open from 8:00 am to 4:30 pm; closed for lunch from 12:45pm-1:45pm.  If you have labs (blood work) drawn today and your tests are completely normal, you will receive your results only by: [place] (if you have MyChart) OR A paper copy in the mail If you have any lab test that is abnormal or we need to change your treatment, we will call you to review the results.   Testing/Procedures: Your physician has requested that you have an echocardiogram. Echocardiography is a painless test that uses sound waves to create images of your heart. It provides your doctor with information about the size and shape of your heart and how well your hearts chambers and valves are working. You may receive an ultrasound enhancing agent through an IV if needed to better visualize your heart during the echo.This procedure takes approximately one hour. There are no restrictions for this procedure. This will take place at  the [address].    Follow-Up: At [facility], you and your health needs are our priority.  As part of our continuing mission to provide you with exceptional heart care, we have created designated Provider Care Teams.  These Care Teams include your primary Cardiologist (physician) and Advanced Practice Providers (APPs -  Physician Assistants and Nurse Practitioners) who all work together to provide you with the care you need, when you need it.  We recommend signing up for the patient portal called "MyChart".  Sign up information is provided on this After Visit Summary.  MyChart is used to connect with patients for Virtual Visits (Telemedicine).  Patients are able to view lab/test results, encounter notes, upcoming appointments, etc.  Non-urgent messages can be sent to your provider as well.   To learn more about what you can do with MyChart, go to [email or web address].    Your next appointment:   Follow up in 3-4 weeks with Dr. Sallyanne Kuster on a pacer day Follow up in 12 months with Dr. Sallyanne Kuster on a pacer day    Patient Instructions  Medication Instructions:  FUROSEMIDE: Take 40 mg once daily POTASSIUM: Take 20 mEq once daily  *If you need a refill on your cardiac medications before your next appointment, please call your pharmacy*   Lab Work: Your provider would like for you to return in one month at your next appointment  to have the following labs drawn: BMET and BNP. You do not need an appointment for the lab. Once in our office lobby  there is a podium where you can sign in and ring the doorbell to alert [place] that you are here. The lab is open from 8:00 am to 4:30 pm; closed for lunch from 12:45pm-1:45pm.  If you have labs (blood work) drawn today and your tests are completely normal, you will receive your results only by: [place] (if you have MyChart) OR A paper copy in the mail If you have any lab test that is abnormal or we need to change your treatment,  we will call you to review the results.   Testing/Procedures: Your physician has requested that you have an echocardiogram. Echocardiography is a painless test that uses sound waves to create images of your heart. It provides your doctor with information about the size and shape of your heart and how well your hearts chambers and valves are working. You may receive an ultrasound enhancing agent through an IV if needed to better visualize your heart during the echo.This procedure takes approximately one hour. There are no restrictions for this procedure. This will take place at the [address].    Follow-Up: At [facility], you and your health needs are our priority.  As part of our continuing mission to provide you with exceptional heart care, we have created designated Provider Care Teams.  These Care Teams include your primary Cardiologist (physician) and Advanced Practice Providers (APPs -  Physician Assistants and Nurse Practitioners) who all work together to provide you with the care you need, when you need it.  We recommend signing up for the patient portal called "MyChart".  Sign up information is provided on this After Visit Summary.  MyChart is used to connect with patients for Virtual Visits (Telemedicine).  Patients are able to view lab/test results, encounter notes, upcoming appointments, etc.  Non-urgent messages can be sent to your provider as well.   To learn more about what you can do with MyChart, go to [email or web address].    Your next appointment:   Follow up in 3-4 weeks with Dr. Sallyanne Kuster on a pacer day Follow up in 12 months with Dr. Sallyanne Kuster on a pacer day   Signed, Sanda Klein, MD  07/07/2021 12:24 PM    [address], [place], [place]  [address] Phone: [phone]; Fax: [phone]

## 2021-07-06 NOTE — Patient Instructions (Signed)
Medication Instructions:  FUROSEMIDE: Take 40 mg once daily POTASSIUM: Take 20 mEq once daily  *If you need a refill on your cardiac medications before your next appointment, please call your pharmacy*   Lab Work: Your provider would like for you to return in one month at your next appointment  to have the following labs drawn: BMET and BNP. You do not need an appointment for the lab. Once in our office lobby there is a podium where you can sign in and ring the doorbell to alert [place] that you are here. The lab is open from 8:00 am to 4:30 pm; closed for lunch from 12:45pm-1:45pm.  If you have labs (blood work) drawn today and your tests are completely normal, you will receive your results only by: [place] (if you have MyChart) OR A paper copy in the mail If you have any lab test that is abnormal or we need to change your treatment, we will call you to review the results.   Testing/Procedures: Your physician has requested that you have an echocardiogram. Echocardiography is a painless test that uses sound waves to create images of your heart. It provides your doctor with information about the size and shape of your heart and how well your hearts chambers and valves are working. You may receive an ultrasound enhancing agent through an IV if needed to better visualize your heart during the echo.This procedure takes approximately one hour. There are no restrictions for this procedure. This will take place at the [address], [address].    Follow-Up: At [facility], you and your health needs are our priority.  As part of our continuing mission to provide you with exceptional heart care, we have created designated Provider Care Teams.  These Care Teams include your primary Cardiologist (physician) and Advanced Practice Providers (APPs -  Physician Assistants and Nurse Practitioners) who all work together to provide you with the care you need, when you need it.  We recommend signing  up for the patient portal called "MyChart".  Sign up information is provided on this After Visit Summary.  MyChart is used to connect with patients for Virtual Visits (Telemedicine).  Patients are able to view lab/test results, encounter notes, upcoming appointments, etc.  Non-urgent messages can be sent to your provider as well.   To learn more about what you can do with MyChart, go to [email or web address].    Your next appointment:   Follow up in 3-4 weeks with Dr. Sallyanne Kuster on a pacer day Follow up in 12 months with Dr. Sallyanne Kuster on a pacer day

## 2021-07-13 ENCOUNTER — Emergency Department (HOSPITAL_COMMUNITY): Payer: Medicare Other

## 2021-07-13 ENCOUNTER — Encounter (HOSPITAL_COMMUNITY): Payer: Self-pay

## 2021-07-13 ENCOUNTER — Inpatient Hospital Stay (HOSPITAL_COMMUNITY)
Admission: EM | Admit: 2021-07-13 | Discharge: 2021-07-17 | DRG: 543 | Disposition: A | Discharge status: Rehabilitation Facility | Payer: Medicare Other | Attending: Internal Medicine | Admitting: Internal Medicine

## 2021-07-13 ENCOUNTER — Other Ambulatory Visit: Payer: Self-pay

## 2021-07-13 DIAGNOSIS — N179 Acute kidney failure, unspecified: Secondary | ICD-10-CM | POA: Diagnosis present

## 2021-07-13 DIAGNOSIS — M8468XA Pathological fracture in other disease, other site, initial encounter for fracture: Principal | ICD-10-CM | POA: Diagnosis present

## 2021-07-13 DIAGNOSIS — N1832 Chronic kidney disease, stage 3b: Secondary | ICD-10-CM | POA: Diagnosis not present

## 2021-07-13 DIAGNOSIS — E782 Mixed hyperlipidemia: Secondary | ICD-10-CM | POA: Diagnosis present

## 2021-07-13 DIAGNOSIS — S32591D Other specified fracture of right pubis, subsequent encounter for fracture with routine healing: Secondary | ICD-10-CM | POA: Diagnosis not present

## 2021-07-13 DIAGNOSIS — I442 Atrioventricular block, complete: Secondary | ICD-10-CM | POA: Diagnosis present

## 2021-07-13 DIAGNOSIS — F411 Generalized anxiety disorder: Secondary | ICD-10-CM | POA: Diagnosis not present

## 2021-07-13 DIAGNOSIS — M549 Dorsalgia, unspecified: Secondary | ICD-10-CM | POA: Diagnosis present

## 2021-07-13 DIAGNOSIS — Z8052 Family history of malignant neoplasm of bladder: Secondary | ICD-10-CM

## 2021-07-13 DIAGNOSIS — S32501A Unspecified fracture of right pubis, initial encounter for closed fracture: Secondary | ICD-10-CM | POA: Diagnosis not present

## 2021-07-13 DIAGNOSIS — Z882 Allergy status to sulfonamides status: Secondary | ICD-10-CM

## 2021-07-13 DIAGNOSIS — I959 Hypotension, unspecified: Secondary | ICD-10-CM | POA: Diagnosis not present

## 2021-07-13 DIAGNOSIS — T501X5A Adverse effect of loop [high-ceiling] diuretics, initial encounter: Secondary | ICD-10-CM | POA: Diagnosis not present

## 2021-07-13 DIAGNOSIS — E860 Dehydration: Secondary | ICD-10-CM | POA: Diagnosis present

## 2021-07-13 DIAGNOSIS — R609 Edema, unspecified: Secondary | ICD-10-CM

## 2021-07-13 DIAGNOSIS — M7989 Other specified soft tissue disorders: Secondary | ICD-10-CM | POA: Diagnosis present

## 2021-07-13 DIAGNOSIS — S32511A Fracture of superior rim of right pubis, initial encounter for closed fracture: Secondary | ICD-10-CM | POA: Diagnosis not present

## 2021-07-13 DIAGNOSIS — M25551 Pain in right hip: Secondary | ICD-10-CM | POA: Diagnosis not present

## 2021-07-13 DIAGNOSIS — E039 Hypothyroidism, unspecified: Secondary | ICD-10-CM | POA: Diagnosis not present

## 2021-07-13 DIAGNOSIS — N1831 Chronic kidney disease, stage 3a: Secondary | ICD-10-CM

## 2021-07-13 DIAGNOSIS — I480 Paroxysmal atrial fibrillation: Secondary | ICD-10-CM | POA: Diagnosis not present

## 2021-07-13 DIAGNOSIS — E876 Hypokalemia: Secondary | ICD-10-CM

## 2021-07-13 DIAGNOSIS — K219 Gastro-esophageal reflux disease without esophagitis: Secondary | ICD-10-CM | POA: Diagnosis present

## 2021-07-13 DIAGNOSIS — J45909 Unspecified asthma, uncomplicated: Secondary | ICD-10-CM | POA: Diagnosis present

## 2021-07-13 DIAGNOSIS — G8929 Other chronic pain: Secondary | ICD-10-CM | POA: Diagnosis present

## 2021-07-13 DIAGNOSIS — Z823 Family history of stroke: Secondary | ICD-10-CM | POA: Diagnosis not present

## 2021-07-13 DIAGNOSIS — S32591A Other specified fracture of right pubis, initial encounter for closed fracture: Secondary | ICD-10-CM | POA: Diagnosis not present

## 2021-07-13 DIAGNOSIS — E669 Obesity, unspecified: Secondary | ICD-10-CM | POA: Diagnosis not present

## 2021-07-13 DIAGNOSIS — D649 Anemia, unspecified: Secondary | ICD-10-CM | POA: Diagnosis not present

## 2021-07-13 DIAGNOSIS — Z6832 Body mass index (BMI) 32.0-32.9, adult: Secondary | ICD-10-CM | POA: Diagnosis not present

## 2021-07-13 DIAGNOSIS — Z950 Presence of cardiac pacemaker: Secondary | ICD-10-CM | POA: Diagnosis present

## 2021-07-13 DIAGNOSIS — S32591S Other specified fracture of right pubis, sequela: Secondary | ICD-10-CM | POA: Diagnosis not present

## 2021-07-13 DIAGNOSIS — Z20822 Contact with and (suspected) exposure to covid-19: Secondary | ICD-10-CM | POA: Diagnosis present

## 2021-07-13 DIAGNOSIS — S32511D Fracture of superior rim of right pubis, subsequent encounter for fracture with routine healing: Secondary | ICD-10-CM | POA: Diagnosis not present

## 2021-07-13 DIAGNOSIS — R600 Localized edema: Secondary | ICD-10-CM | POA: Diagnosis present

## 2021-07-13 DIAGNOSIS — Z79890 Hormone replacement therapy (postmenopausal): Secondary | ICD-10-CM

## 2021-07-13 DIAGNOSIS — Z7982 Long term (current) use of aspirin: Secondary | ICD-10-CM | POA: Diagnosis not present

## 2021-07-13 DIAGNOSIS — I129 Hypertensive chronic kidney disease with stage 1 through stage 4 chronic kidney disease, or unspecified chronic kidney disease: Secondary | ICD-10-CM | POA: Diagnosis not present

## 2021-07-13 DIAGNOSIS — M47816 Spondylosis without myelopathy or radiculopathy, lumbar region: Secondary | ICD-10-CM | POA: Diagnosis not present

## 2021-07-13 DIAGNOSIS — E785 Hyperlipidemia, unspecified: Secondary | ICD-10-CM | POA: Diagnosis not present

## 2021-07-13 DIAGNOSIS — I495 Sick sinus syndrome: Secondary | ICD-10-CM | POA: Diagnosis present

## 2021-07-13 DIAGNOSIS — E871 Hypo-osmolality and hyponatremia: Secondary | ICD-10-CM | POA: Diagnosis not present

## 2021-07-13 DIAGNOSIS — N281 Cyst of kidney, acquired: Secondary | ICD-10-CM | POA: Diagnosis not present

## 2021-07-13 DIAGNOSIS — I5030 Unspecified diastolic (congestive) heart failure: Secondary | ICD-10-CM | POA: Diagnosis not present

## 2021-07-13 DIAGNOSIS — R5381 Other malaise: Secondary | ICD-10-CM | POA: Diagnosis not present

## 2021-07-13 DIAGNOSIS — I10 Essential (primary) hypertension: Secondary | ICD-10-CM | POA: Diagnosis not present

## 2021-07-13 DIAGNOSIS — M1611 Unilateral primary osteoarthritis, right hip: Secondary | ICD-10-CM | POA: Diagnosis not present

## 2021-07-13 DIAGNOSIS — D72829 Elevated white blood cell count, unspecified: Secondary | ICD-10-CM | POA: Diagnosis present

## 2021-07-13 DIAGNOSIS — K5900 Constipation, unspecified: Secondary | ICD-10-CM | POA: Diagnosis not present

## 2021-07-13 DIAGNOSIS — Z881 Allergy status to other antibiotic agents status: Secondary | ICD-10-CM

## 2021-07-13 DIAGNOSIS — M958 Other specified acquired deformities of musculoskeletal system: Secondary | ICD-10-CM | POA: Diagnosis not present

## 2021-07-13 DIAGNOSIS — Z888 Allergy status to other drugs, medicaments and biological substances status: Secondary | ICD-10-CM

## 2021-07-13 DIAGNOSIS — Z79899 Other long term (current) drug therapy: Secondary | ICD-10-CM

## 2021-07-13 DIAGNOSIS — S32599S Other specified fracture of unspecified pubis, sequela: Secondary | ICD-10-CM | POA: Diagnosis not present

## 2021-07-13 DIAGNOSIS — S32609S Unspecified fracture of unspecified ischium, sequela: Secondary | ICD-10-CM | POA: Diagnosis not present

## 2021-07-13 DIAGNOSIS — X58XXXD Exposure to other specified factors, subsequent encounter: Secondary | ICD-10-CM | POA: Diagnosis present

## 2021-07-13 DIAGNOSIS — R079 Chest pain, unspecified: Secondary | ICD-10-CM | POA: Diagnosis not present

## 2021-07-13 LAB — COMPREHENSIVE METABOLIC PANEL
ALT: 24 U/L (ref 0–44)
AST: 18 U/L (ref 15–41)
Albumin: 3.5 g/dL (ref 3.5–5.0)
Alkaline Phosphatase: 95 U/L (ref 38–126)
Anion gap: 14 (ref 5–15)
BUN: 66 mg/dL — ABNORMAL HIGH (ref 8–23)
CO2: 23 mmol/L (ref 22–32)
Calcium: 9.2 mg/dL (ref 8.9–10.3)
Chloride: 97 mmol/L — ABNORMAL LOW (ref 98–111)
Creatinine, Ser: 1.71 mg/dL — ABNORMAL HIGH (ref 0.44–1.00)
GFR, Estimated: 29 mL/min — ABNORMAL LOW (ref >60)
Glucose, Bld: 138 mg/dL — ABNORMAL HIGH (ref 70–99)
Potassium: 4 mmol/L (ref 3.5–5.1)
Sodium: 134 mmol/L — ABNORMAL LOW (ref 135–145)
Total Bilirubin: 0.4 mg/dL (ref 0.3–1.2)
Total Protein: 6.9 g/dL (ref 6.5–8.1)

## 2021-07-13 LAB — CBC WITH DIFFERENTIAL/PLATELET
Abs Immature Granulocytes: 0.12 10*3/uL — ABNORMAL HIGH (ref 0.00–0.07)
Basophils Absolute: 0.1 10*3/uL (ref 0.0–0.1)
Basophils Relative: 1 %
Eosinophils Absolute: 0.2 10*3/uL (ref 0.0–0.5)
Eosinophils Relative: 1 %
HCT: 33.9 % — ABNORMAL LOW (ref 36.0–46.0)
Hemoglobin: 11 g/dL — ABNORMAL LOW (ref 12.0–15.0)
Immature Granulocytes: 1 %
Lymphocytes Relative: 15 %
Lymphs Abs: 1.9 10*3/uL (ref 0.7–4.0)
MCH: 30.3 pg (ref 26.0–34.0)
MCHC: 32.4 g/dL (ref 30.0–36.0)
MCV: 93.4 fL (ref 80.0–100.0)
Monocytes Absolute: 0.9 10*3/uL (ref 0.1–1.0)
Monocytes Relative: 8 %
Neutro Abs: 9 10*3/uL — ABNORMAL HIGH (ref 1.7–7.7)
Neutrophils Relative %: 74 %
Platelets: 288 10*3/uL (ref 150–400)
RBC: 3.63 MIL/uL — ABNORMAL LOW (ref 3.87–5.11)
RDW: 13.2 % (ref 11.5–15.5)
WBC: 12.2 10*3/uL — ABNORMAL HIGH (ref 4.0–10.5)
nRBC: 0 % (ref 0.0–0.2)

## 2021-07-13 LAB — BRAIN NATRIURETIC PEPTIDE: B Natriuretic Peptide: 23.5 pg/mL (ref 0.0–100.0)

## 2021-07-13 LAB — URINALYSIS, COMPLETE (UACMP) WITH MICROSCOPIC
Bacteria, UA: NONE SEEN
Bilirubin Urine: NEGATIVE
Glucose, UA: NEGATIVE mg/dL
Hgb urine dipstick: NEGATIVE
Ketones, ur: NEGATIVE mg/dL
Leukocytes,Ua: NEGATIVE
Nitrite: NEGATIVE
Protein, ur: NEGATIVE mg/dL
Specific Gravity, Urine: 1.011 (ref 1.005–1.030)
pH: 6 (ref 5.0–8.0)

## 2021-07-13 LAB — TROPONIN I (HIGH SENSITIVITY)
Troponin I (High Sensitivity): 12 ng/L (ref <18)
Troponin I (High Sensitivity): 19 ng/L — ABNORMAL HIGH (ref <18)

## 2021-07-13 MED ORDER — PRAVASTATIN SODIUM 40 MG PO TABS
40.0000 mg | ORAL_TABLET | Freq: Every day | ORAL | Status: DC
  Administered 2021-07-13 – 2021-07-16 (×4): 40 mg via ORAL
  Filled 2021-07-13 (×4): qty 1

## 2021-07-13 MED ORDER — HYDROCODONE-ACETAMINOPHEN 5-325 MG PO TABS
1.0000 | ORAL_TABLET | Freq: Four times a day (QID) | ORAL | Status: DC | PRN
  Administered 2021-07-14 – 2021-07-15 (×2): 2 via ORAL
  Filled 2021-07-13 (×3): qty 2

## 2021-07-13 MED ORDER — LORAZEPAM 0.5 MG PO TABS
0.5000 mg | ORAL_TABLET | Freq: Every day | ORAL | Status: DC | PRN
  Administered 2021-07-15: 13:00:00 0.5 mg via ORAL
  Filled 2021-07-13: qty 1

## 2021-07-13 MED ORDER — PANTOPRAZOLE SODIUM 40 MG PO TBEC
80.0000 mg | DELAYED_RELEASE_TABLET | Freq: Two times a day (BID) | ORAL | Status: DC
  Administered 2021-07-13 – 2021-07-17 (×8): 80 mg via ORAL
  Filled 2021-07-13 (×8): qty 2

## 2021-07-13 MED ORDER — ASPIRIN 81 MG PO CHEW
81.0000 mg | CHEWABLE_TABLET | Freq: Every day | ORAL | Status: DC
  Administered 2021-07-13 – 2021-07-16 (×4): 81 mg via ORAL
  Filled 2021-07-13 (×4): qty 1

## 2021-07-13 MED ORDER — MORPHINE SULFATE (PF) 4 MG/ML IV SOLN
4.0000 mg | Freq: Once | INTRAVENOUS | Status: AC
  Administered 2021-07-13: 4 mg via INTRAVENOUS
  Filled 2021-07-13: qty 1

## 2021-07-13 MED ORDER — ENOXAPARIN SODIUM 30 MG/0.3ML IJ SOSY
30.0000 mg | PREFILLED_SYRINGE | INTRAMUSCULAR | Status: DC
  Administered 2021-07-13 – 2021-07-16 (×4): 30 mg via SUBCUTANEOUS
  Filled 2021-07-13 (×4): qty 0.3

## 2021-07-13 MED ORDER — MORPHINE SULFATE (PF) 2 MG/ML IV SOLN: 0.5000 mg | INTRAVENOUS | Status: DC | PRN

## 2021-07-13 MED ORDER — DILTIAZEM HCL ER COATED BEADS 180 MG PO CP24
180.0000 mg | ORAL_CAPSULE | Freq: Every day | ORAL | Status: DC
  Administered 2021-07-14 – 2021-07-16 (×4): 180 mg via ORAL
  Filled 2021-07-13 (×5): qty 1

## 2021-07-13 MED ORDER — ONDANSETRON HCL 4 MG/2ML IJ SOLN
4.0000 mg | Freq: Once | INTRAMUSCULAR | Status: AC
  Administered 2021-07-13: 4 mg via INTRAVENOUS
  Filled 2021-07-13: qty 2

## 2021-07-13 MED ORDER — FUROSEMIDE 10 MG/ML IJ SOLN
40.0000 mg | Freq: Once | INTRAMUSCULAR | Status: AC
  Administered 2021-07-13: 40 mg via INTRAVENOUS
  Filled 2021-07-13: qty 4

## 2021-07-13 MED ORDER — METOPROLOL SUCCINATE ER 25 MG PO TB24
25.0000 mg | ORAL_TABLET | Freq: Every day | ORAL | Status: DC
  Administered 2021-07-13 – 2021-07-16 (×4): 25 mg via ORAL
  Filled 2021-07-13 (×6): qty 1

## 2021-07-13 NOTE — ED Provider Notes (Signed)
[facility] Provider Note  CSN: [id number] Arrival date & time: 07/13/21 0932  Chief Complaint(s) Leg Swelling and Hip Pain  HPI [name] is a 88 y.o. female with PMH HTN, HLD, paroxysmal A. fib, sick sinus syndrome and complete heart block status post pacemaker placement, CKD 3 who presents the emergency department for evaluation of lower extremity swelling and right hip pain.  Patient received a steroid injection in her back approximately 1 week ago and has had progressively worsening right lower extremity pain that has now impeded her ability to walk.  She has had worsening lower extremity edema despite doubling her Lasix.  She denies shortness of breath, numbness, tingling, weakness of lower extremity.  Denies fever, chest pain, headache or other systemic symptoms.   Hip Pain   Past Medical History Past Medical History:  Diagnosis Date   Allergic rhinitis    Anginal pain (HCC)    Asthmatic bronchitis    Chronic cough    Dysrhythmia    PAF   Esophageal reflux    GERD (gastroesophageal reflux disease)    History of stress test 11/20/2009   This was essentially normal,post-stress EF was hyperdynamic at 73 beats per mins, There was no scar or ischemia.   Hx of echocardiogram 11/05/2010   EF>55% showed a normal systolic function with impaired diastolic dysfuntion and also tissue doppler suggesting increased elevated left atrial pressure, she had moderate LA dilatation and mild LA dilatation. There was calcified mitral apparatus with moderate MR, mild to moderate TR and trace aortic insufficiency.   Hyperlipemia    Hypertension    Hypothyroidism    PAF (paroxysmal atrial fibrillation) ([place])    Pneumonia    Patient Active Problem List   Diagnosis Date Noted   Stage 3a chronic kidney disease ([place]) 04/27/2021   Osteopenia 04/27/2021   Adjustment disorder with anxiety 04/27/2021   Choroidal nevus of right eye 03/06/2020   Early stage  nonexudative age-related macular degeneration of right eye 03/06/2020   Early stage nonexudative age-related macular degeneration of left eye 03/06/2020   Pseudophakia 03/06/2020   SSS (sick sinus syndrome) ([place]) 06/15/2017   PAT (paroxysmal atrial tachycardia) ([place]) 06/15/2017   Laryngopharyngeal reflux (LPR) 03/11/2017   Vitamin D deficiency 07/27/2016   Primary hypothyroidism 07/27/2016   Impaired fasting glucose 07/27/2016   Lower extremity edema 11/22/2014   CHB (complete heart block) ([place]) 05/17/2013   Paroxysmal atrial fibrillation ([place]) 05/17/2013   Pacemaker 02/04/2013   Essential hypertension 02/04/2013   Mixed hyperlipidemia 02/04/2013   Allergic rhinitis due to pollen 08/21/2010   Asthma 02/20/2010   GERD (gastroesophageal reflux disease) 10/26/2007   Chronic cough 10/26/2007   Home Medication(s) Prior to Admission medications   Medication Sig Start Date End Date Taking? Authorizing Provider  Ascorbic Acid (VITAMIN C) 100 MG tablet 1 tablet    [provider]  aspirin 81 MG chewable tablet 1 tablet    [provider]  calcium carbonate (TUMS EX) 750 MG chewable tablet Chew 1 tablet by mouth daily as needed for heartburn.    [provider]  cetirizine (ZYRTEC) 10 MG tablet 1 tablet    [provider]  cholecalciferol (VITAMIN D3) 25 MCG (1000 UNIT) tablet 1 tablet    [provider]  diltiazem (CARDIZEM CD) 180 MG 24 hr capsule TAKE 1 CAPSULE BY MOUTH EVERY DAY Patient taking differently: Take 180 mg by mouth daily. TAKE 1 CAPSULE BY MOUTH EVERY DAY 07/21/20   Shelva Majestic  A, MD  esomeprazole (NEXIUM) 40 MG capsule 1 capsule 01/24/12   [provider]  furosemide (LASIX) 40 MG tablet Take 1 tablet (40 mg total) by mouth daily. 07/06/21   Croitoru, Mihai, MD  levothyroxine (SYNTHROID) 25 MCG tablet 1 tablet in the morning on an empty stomach    [provider]  lidocaine (LIDODERM) 5 % Lidoderm 5 % topical patch   APPLY 1 PATCH BY TOPICAL ROUTE ONCE DAILY (MAY WEAR UP TO 12HOURS.)    [provider]  LORazepam (ATIVAN) 0.5 MG tablet Take 1 tablet (0.5 mg total) by mouth daily as needed for anxiety. 04/27/21   Leamon Arnt, MD  metoprolol succinate (TOPROL-XL) 25 MG 24 hr tablet TAKE 1 TABLET (25 MG TOTAL) BY MOUTH DAILY. 06/02/21   Troy Sine, MD  pantoprazole (PROTONIX) 40 MG tablet Take 80 mg by mouth daily. Takes 2 tablets daily    [provider]  potassium chloride SA (KLOR-CON M20) 20 MEQ tablet Take 1 tablet (20 mEq total) by mouth daily. 07/06/21 10/04/21  Croitoru, Mihai, MD  pravastatin (PRAVACHOL) 40 MG tablet 1 tablet    [provider]  simvastatin (ZOCOR) 20 MG tablet 1/2 tablet Patient not taking: Reported on 07/06/2021    [provider]  tiZANidine (ZANAFLEX) 2 MG tablet tizanidine 2 mg tablet  Take 1 tablet 3 times a day by oral route as needed. Patient not taking: Reported on 07/06/2021    [provider]  valsartan-hydrochlorothiazide (DIOVAN-HCT) 160-25 MG tablet TAKE 1 TABLET BY MOUTH EVERY DAY 05/18/21   Croitoru, Dani Gobble, MD                                                                                                                                    Past Surgical History Past Surgical History:  Procedure Laterality Date   CHOLECYSTECTOMY     CYSTOSCOPY WITH BIOPSY N/A 08/29/2020   Procedure: CYSTOSCOPY WITH BIOPSY WITH FULGURATION;  Surgeon: Irine Seal, MD;  Location: [facility];  Service: Urology;  Laterality: N/A;   FOOT SURGERY     right   NASAL SEPTUM SURGERY     PACEMAKER INSERTION  June 1st 2009   St Jude [place] device, model #5826, serial #[id number], The artial lead is a Interior and spatial designer, the ventricular lead is Environmental education officer.   PPM GENERATOR CHANGEOUT N/A 02/09/2017   Procedure: PPM GENERATOR CHANGEOUT;  Surgeon: Sanda Klein, MD;  Location: [facility];  Service: Cardiovascular;  Laterality: N/A;   THYROID SURGERY      TONSILLECTOMY     TONSILLECTOMY     VESICOVAGINAL FISTULA CLOSURE W/ TAH     Family History Family History  Problem Relation Age of Onset   Stroke Mother    Cancer Father        bladder   Stroke Sister     Social History Social History   Tobacco Use  Smoking status: Never   Smokeless tobacco: Never  Vaping Use   Vaping Use: Never used  Substance Use Topics   Alcohol use: No   Drug use: No   Allergies Fluticasone-salmeterol, Sulfa antibiotics, Sulfonamide derivatives, Benzonatate, Erythromycin, Meloxicam, and Sulfamethoxazole  Review of Systems Review of Systems  Cardiovascular:  Positive for leg swelling.  Musculoskeletal:  Positive for myalgias.   Physical Exam Vital Signs  I have reviewed the triage vital signs BP (!) 148/44    Pulse 79    Temp 97.9 F (36.6 C) (Oral)    Resp 17    SpO2 98%   Physical Exam Vitals and nursing note reviewed.  Constitutional:      General: She is not in acute distress.    Appearance: She is well-developed.  HENT:     Head: Normocephalic and atraumatic.  Eyes:     Conjunctiva/sclera: Conjunctivae normal.  Cardiovascular:     Rate and Rhythm: Normal rate and regular rhythm.     Heart sounds: No murmur heard. Pulmonary:     Effort: Pulmonary effort is normal. No respiratory distress.     Breath sounds: Normal breath sounds.  Abdominal:     Palpations: Abdomen is soft.     Tenderness: There is no abdominal tenderness.  Musculoskeletal:        General: Tenderness (On external rotation of right hip) present. No swelling.     Cervical back: Neck supple.     Right lower leg: Edema present.     Left lower leg: Edema present.  Skin:    General: Skin is warm and dry.     Capillary Refill: Capillary refill takes less than 2 seconds.  Neurological:     Mental Status: She is alert.  Psychiatric:        Mood and Affect: Mood normal.    ED Results and Treatments Labs (all labs ordered are listed, but only abnormal results  are displayed) Labs Reviewed  COMPREHENSIVE METABOLIC PANEL - Abnormal; Notable for the following components:      Result Value   Sodium 134 (*)    Chloride 97 (*)    Glucose, Bld 138 (*)    BUN 66 (*)    Creatinine, Ser 1.71 (*)    GFR, Estimated 29 (*)    All other components within normal limits  CBC WITH DIFFERENTIAL/PLATELET - Abnormal; Notable for the following components:   WBC 12.2 (*)    RBC 3.63 (*)    Hemoglobin 11.0 (*)    HCT 33.9 (*)    Neutro Abs 9.0 (*)    Abs Immature Granulocytes 0.12 (*)    All other components within normal limits  TROPONIN I (HIGH SENSITIVITY) - Abnormal; Notable for the following components:   Troponin I (High Sensitivity) 19 (*)    All other components within normal limits  BRAIN NATRIURETIC PEPTIDE  URINALYSIS, ROUTINE W REFLEX MICROSCOPIC  TROPONIN I (HIGH SENSITIVITY)  Radiology DG Chest 2 View  Result Date: 07/13/2021 CLINICAL DATA:  88 year old female with right leg pain. Right hip pain. EXAM: CHEST - 2 VIEW COMPARISON:  Chest radiographs 08/05/2016 and earlier. FINDINGS: Upright AP and lateral views of the chest. Right chest dual lead cardiac pacemaker is chronic. Stable cardiac size, borderline to mildly enlarged. Other mediastinal contours are within normal limits. Visualized tracheal air column is within normal limits. Lung volumes are stable and normal. Both lungs appear clear. No pneumothorax or pleural effusion. Abdominal Calcified aortic atherosclerosis. Stable cholecystectomy clips. Osteopenia. No acute osseous abnormality identified. IMPRESSION: No acute cardiopulmonary abnormality. Aortic Atherosclerosis (ICD10-I70.0). Electronically Signed   By: Genevie Ann M.D.   On: 07/13/2021 10:55   DG Hip Unilat W or Wo Pelvis 2-3 Views Right  Result Date: 07/13/2021 CLINICAL DATA:  Hip pain EXAM: DG HIP (WITH OR WITHOUT  PELVIS) 3V RIGHT COMPARISON:  None. FINDINGS: Stable deformity of the right superior pubic ramus. Mild degenerative changes of the bilateral hips, SI joints and pubic symphysis. Degenerative changes of the partially visualized lower lumbar spine. Soft tissues are unremarkable. IMPRESSION: Step-off deformity of the right superior pubic ramus, concerning for fracture. Recommend further evaluation with cross-sectional imaging. Electronically Signed   By: Yetta Glassman M.D.   On: 07/13/2021 17:08   VAS [place] LOWER EXTREMITY VENOUS (DVT) (ONLY [facility])  Result Date: 07/13/2021  Lower Venous DVT Study Patient Name:  GIULIANNA ROCHA  Date of Exam:   07/13/2021 Medical Rec #: [id number]      Accession #:    [id number] Date of Birth: [birth year]       Patient Gender: F Patient Age:   27 years Exam Location:  [facility] Procedure:      VAS [place] LOWER EXTREMITY VENOUS (DVT) Referring Phys: Benjamine Mola HAMMOND --------------------------------------------------------------------------------  Indications: Edema.  Risk Factors: None identified. Limitations: Body habitus, poor ultrasound/tissue interface and patient pain tolerance. Comparison Study: No prior studies. Performing Technologist: Oliver Hum RVT  Examination Guidelines: A complete evaluation includes B-mode imaging, spectral Doppler, color Doppler, and power Doppler as needed of all accessible portions of each vessel. Bilateral testing is considered an integral part of a complete examination. Limited examinations for reoccurring indications may be performed as noted. The reflux portion of the exam is performed with the patient in reverse Trendelenburg.  +---------+---------------+---------+-----------+----------+-------------------+  RIGHT     Compressibility Phasicity Spontaneity Properties Thrombus Aging       +---------+---------------+---------+-----------+----------+-------------------+  CFV       Full            Yes       Yes                                          +---------+---------------+---------+-----------+----------+-------------------+  SFJ       Full                                                                  +---------+---------------+---------+-----------+----------+-------------------+  FV Prox   Full                                                                  +---------+---------------+---------+-----------+----------+-------------------+  FV Mid    Full                                                                  +---------+---------------+---------+-----------+----------+-------------------+  FV Distal                 Yes       Yes                                         +---------+---------------+---------+-----------+----------+-------------------+  PFV       Full                                                                  +---------+---------------+---------+-----------+----------+-------------------+  POP       Full            Yes       Yes                                         +---------+---------------+---------+-----------+----------+-------------------+  PTV       Full                                                                  +---------+---------------+---------+-----------+----------+-------------------+  PERO                                                       Not well visualized  +---------+---------------+---------+-----------+----------+-------------------+   +----+---------------+---------+-----------+----------+--------------+  LEFT Compressibility Phasicity Spontaneity Properties Thrombus Aging  +----+---------------+---------+-----------+----------+--------------+  CFV  Full            Yes       Yes                                    +----+---------------+---------+-----------+----------+--------------+    Summary: RIGHT: - There is no evidence of deep vein thrombosis in the lower extremity. However, portions of this examination were limited- see technologist comments above.  - No cystic structure found in  the popliteal fossa.  LEFT: - No evidence of common femoral vein obstruction.  *See table(s) above for measurements and observations.    Preliminary     Pertinent labs & imaging results that were available during my care of the patient were reviewed by me and considered in my medical decision making (see MDM for details).  Medications Ordered in ED Medications  morphine 4 MG/ML injection 4 mg (  4 mg Intravenous Given 07/13/21 1841)  ondansetron (ZOFRAN) injection 4 mg (4 mg Intravenous Given 07/13/21 1840)  furosemide (LASIX) injection 40 mg (40 mg Intravenous Given 07/13/21 1841)                                                                                                                                     Procedures Procedures  (including critical care time)  Medical Decision Making / ED Course   This patient presents to the ED for concern of right hip pain, lower extremity swelling, this involves an extensive number of treatment options, and is a complaint that carries with it a high risk of complications and morbidity.  The differential diagnosis includes CHF exacerbation, renal failure, fracture, ligamentous injury, sprain, osteoarthritis  MDM: Patient seen emergency department for evaluation of right hip pain and lower extremity swelling.  Physical exam with significant tenderness on external rotation of the right hip.  Laboratory evaluation reveals an elevated BUN to 66, creatinine 1.71 which is an elevation for this patient.  Leukocytosis of 12.2 likely elevated in the setting of stress demargination from pain.  X-ray of the hip reveals a pubic ramus fracture.  Radiology recommending CT of the hip and I consulted orthopedics who is recommending partial weightbearing at this time and they will follow-up on the CT and give recommendations.  Lasix given for lower extremity edema patient will require admission for diuresis in the setting of her worsening lower extremity edema and inability  to walk secondary to a new fracture without known mechanism.  Patient then admitted.   Additional history obtained: -Additional history obtained from husband -External records from outside source obtained and reviewed including: Chart review including previous notes, labs, imaging, consultation notes   Lab Tests: -I ordered, reviewed, and interpreted labs.   The pertinent results include:   Labs Reviewed  COMPREHENSIVE METABOLIC PANEL - Abnormal; Notable for the following components:      Result Value   Sodium 134 (*)    Chloride 97 (*)    Glucose, Bld 138 (*)    BUN 66 (*)    Creatinine, Ser 1.71 (*)    GFR, Estimated 29 (*)    All other components within normal limits  CBC WITH DIFFERENTIAL/PLATELET - Abnormal; Notable for the following components:   WBC 12.2 (*)    RBC 3.63 (*)    Hemoglobin 11.0 (*)    HCT 33.9 (*)    Neutro Abs 9.0 (*)    Abs Immature Granulocytes 0.12 (*)    All other components within normal limits  TROPONIN I (HIGH SENSITIVITY) - Abnormal; Notable for the following components:   Troponin I (High Sensitivity) 19 (*)    All other components within normal limits  BRAIN NATRIURETIC PEPTIDE  URINALYSIS, ROUTINE W REFLEX MICROSCOPIC  TROPONIN I (HIGH SENSITIVITY)       Imaging Studies ordered: I ordered imaging studies including XR hip,  CXR, CT hip I independently visualized and interpreted imaging. I agree with the radiologist interpretation   Medicines ordered and prescription drug management: Meds ordered this encounter  Medications   morphine 4 MG/ML injection 4 mg   ondansetron (ZOFRAN) injection 4 mg   furosemide (LASIX) injection 40 mg    -I have reviewed the patients home medicines and have made adjustments as needed  Critical interventions none  Consultations Obtained: I requested consultation with the orthopedic surgeon,  and discussed lab and imaging findings as well as pertinent plan - they recommend: Partial weightbearing, CT  hip and admit   Cardiac Monitoring: The patient was maintained on a cardiac monitor.  I personally viewed and interpreted the cardiac monitored which showed an underlying rhythm of: Normal sinus rhythm  Social Determinants of Health:  Factors impacting patients care include: None   Reevaluation: After the interventions noted above, I reevaluated the patient and found that they have :improved  Co morbidities that complicate the patient evaluation  Past Medical History:  Diagnosis Date   Allergic rhinitis    Anginal pain (HCC)    Asthmatic bronchitis    Chronic cough    Dysrhythmia    PAF   Esophageal reflux    GERD (gastroesophageal reflux disease)    History of stress test 11/20/2009   This was essentially normal,post-stress EF was hyperdynamic at 73 beats per mins, There was no scar or ischemia.   Hx of echocardiogram 11/05/2010   EF>55% showed a normal systolic function with impaired diastolic dysfuntion and also tissue doppler suggesting increased elevated left atrial pressure, she had moderate LA dilatation and mild LA dilatation. There was calcified mitral apparatus with moderate MR, mild to moderate TR and trace aortic insufficiency.   Hyperlipemia    Hypertension    Hypothyroidism    PAF (paroxysmal atrial fibrillation) (HCC)    Pneumonia       Dispostion: I considered admission for this patient, and the patient was admitted.     Final Clinical Impression(s) / ED Diagnoses Final diagnoses:  None     @PCDICTATION @    Teressa Lower, MD 07/13/21 1946

## 2021-07-13 NOTE — Assessment & Plan Note (Addendum)
-  CT scan of the pelvis showed right superior and inferior pubic ramus fractures.  Patient tells me that she is having chronic back pain for which she saw a spine surgeon as an outpatient about 10 days ago, and she was assisted to get onto the table to receive a steroid injection and felt like the move was very sudden and felt like she hit the hard table too hard at that time.  She has not been able to walk since.  Orthopedic surgery consulted, likely nonsurgical.  PT consult pending.

## 2021-07-13 NOTE — ED Notes (Signed)
Bladder scan results reported to dr gardner, states hold on in and out cath at this time

## 2021-07-13 NOTE — Assessment & Plan Note (Addendum)
-  AKI vs rapid progression of CKD. Baseline Cr 1.1-1.2. Continue to hold diovan. UA unremarkable.  She underwent a renal ultrasound that suggested possible obstruction with large volume of urine in the bladder, however right after the procedure she had 1 L urinary output.  Several hours later, bladder ultrasound showed 470 mL, then immediately afterwards she voided 500 mL.  There is no current concern about true obstruction.  Received Lasix, lower extremity swelling improved.  Renal function is better this morning, repeat Lasix this morning

## 2021-07-13 NOTE — ED Provider Triage Note (Signed)
Emergency Medicine Provider Triage Evaluation Note  [name] , a 88 y.o. female  was evaluated in triage.  Pt complains of leg swelling.  This has been going on for about a week.  She feels like the right leg is more swollen than the left.  She reprots her cardiologist doubled her lasix with out any improvement.  No fevers.  No CP.  Frequent cough in the room, but she denies cough.   Review of Systems  Positive:  Negative:   Physical Exam  BP (!) 139/51 (BP Location: Right Arm)    Pulse 77    Temp 97.9 F (36.6 C) (Oral)    Resp 16    SpO2 100%  Gen:   Awake, no distress   Resp:  Normal effort, dry cough  MSK:   Moves extremities without difficulty, BLE edema, Right >L Other:    Medical Decision Making  Medically screening exam initiated at 9:57 AM.  Appropriate orders placed.  [name] was informed that the remainder of the evaluation will be completed by another provider, this initial triage assessment does not replace that evaluation, and the importance of remaining in the ED until their evaluation is complete.     Lorin Glass, [place] 07/13/21 1000

## 2021-07-13 NOTE — Assessment & Plan Note (Addendum)
-  Hold Diovan due to AKI.  Receiving Lasix, blood pressure within acceptable parameters.  Continue diltiazem, metoprolol

## 2021-07-13 NOTE — Progress Notes (Signed)
Right lower extremity venous duplex has been completed. Preliminary results can be found in CV Proc through chart review.  Results were given to Wyn Quaker PA.  07/13/21 11:29 AM [name] RVT

## 2021-07-13 NOTE — ED Triage Notes (Signed)
Pt was given an unknown pain shot in her back approximately one week ago. Since then patient having R hip pain and BLE swelling with clear drainage from same. Denies sob. Takes lasix and is compliant.

## 2021-07-13 NOTE — Assessment & Plan Note (Addendum)
-  Per chart,"Very brief episodes were recorded by her pacemaker in 2012 (less than 2 minutes) they have not recurred since. She is on aspirin alone for embolism prophylaxis". Continue cardizem and metoprolol.

## 2021-07-13 NOTE — Assessment & Plan Note (Addendum)
-   This appears to be acute on chronic.  Already better this morning after receiving Lasix.  She saw her cardiologist about a week ago and has received oral Lasix but did not appreciate improvement.  Repeat IV Lasix this morning.  2D echo has been ordered and is pending.

## 2021-07-13 NOTE — H&P (Signed)
History and Physical    Patient: [name] DOB: 1931/12/14 DOA: 07/13/2021 DOS: the patient was seen and examined on 07/13/2021 PCP: Leamon Arnt, MD  Patient coming from: Home  Chief Complaint: Leg swelling and hip pain  HPI: [name] is a 88 y.o. female with medical history significant of HTN, HLD, SSS s/p PPM.  Pt presents to the ED with c/o BLE edema and R hip pain.  Patient received a steroid injection in her back approximately 1 week ago and has had progressively worsening right lower extremity pain that has now impeded her ability to walk.  She has had worsening lower extremity edema despite doubling her Lasix.  She denies shortness of breath, numbness, tingling, weakness of lower extremity.  Denies fever, chest pain, headache or other systemic symptoms.  Review of Systems: As mentioned in the history of present illness. All other systems reviewed and are negative. Past Medical History:  Diagnosis Date   Allergic rhinitis    Anginal pain (HCC)    Asthmatic bronchitis    Chronic cough    Dysrhythmia    PAF   Esophageal reflux    GERD (gastroesophageal reflux disease)    History of stress test 11/20/2009   This was essentially normal,post-stress EF was hyperdynamic at 73 beats per mins, There was no scar or ischemia.   Hx of echocardiogram 11/05/2010   EF>55% showed a normal systolic function with impaired diastolic dysfuntion and also tissue doppler suggesting increased elevated left atrial pressure, she had moderate LA dilatation and mild LA dilatation. There was calcified mitral apparatus with moderate MR, mild to moderate TR and trace aortic insufficiency.   Hyperlipemia    Hypertension    Hypothyroidism    PAF (paroxysmal atrial fibrillation) ([place])    Pneumonia    Past Surgical History:  Procedure Laterality Date   CHOLECYSTECTOMY     CYSTOSCOPY WITH BIOPSY N/A 08/29/2020   Procedure: CYSTOSCOPY WITH BIOPSY WITH FULGURATION;  Surgeon:  Irine Seal, MD;  Location: [facility];  Service: Urology;  Laterality: N/A;   FOOT SURGERY     right   NASAL SEPTUM SURGERY     PACEMAKER INSERTION  June 1st 2009   St Jude [place] device, model #5826, serial #[id number], The artial lead is a Interior and spatial designer, the ventricular lead is Environmental education officer.   PPM GENERATOR CHANGEOUT N/A 02/09/2017   Procedure: PPM GENERATOR CHANGEOUT;  Surgeon: Sanda Klein, MD;  Location: [facility];  Service: Cardiovascular;  Laterality: N/A;   THYROID SURGERY     TONSILLECTOMY     TONSILLECTOMY     VESICOVAGINAL FISTULA CLOSURE W/ TAH     Social History:  reports that she has never smoked. She has never used smokeless tobacco. She reports that she does not drink alcohol and does not use drugs.  Allergies  Allergen Reactions   Fluticasone-Salmeterol Other (See Comments)    unknown Other reaction(s): tongue/throat symptoms   Sulfa Antibiotics     Other reaction(s): gastric intol   Sulfonamide Derivatives Other (See Comments)    unknown   Benzonatate Other (See Comments)    tongue/throat symptoms in the past but tolerated more recently Other reaction(s): and tongue/throat symptoms in the past (not more recently)   Erythromycin Nausea And Vomiting and Other (See Comments)    Stomach cramps Other reaction(s): gastric intol   Meloxicam Other (See Comments)    Mild dyspnea Other reaction(s): dyspnea   Sulfamethoxazole Other (See Comments)    Gastric  intolerance    Family History  Problem Relation Age of Onset   Stroke Mother    Cancer Father        bladder   Stroke Sister     Prior to Admission medications   Medication Sig Start Date End Date Taking? Authorizing Provider  Ascorbic Acid (VITAMIN C) 100 MG tablet 1 tablet    [provider]  aspirin 81 MG chewable tablet 1 tablet    [provider]  calcium carbonate (TUMS EX) 750 MG chewable tablet Chew 1 tablet by mouth daily as needed for heartburn.    [provider]   cetirizine (ZYRTEC) 10 MG tablet 1 tablet    [provider]  cholecalciferol (VITAMIN D3) 25 MCG (1000 UNIT) tablet 1 tablet    [provider]  diltiazem (CARDIZEM CD) 180 MG 24 hr capsule TAKE 1 CAPSULE BY MOUTH EVERY DAY Patient taking differently: Take 180 mg by mouth daily. TAKE 1 CAPSULE BY MOUTH EVERY DAY 07/21/20   Troy Sine, MD  esomeprazole ([place]) 40 MG capsule 1 capsule 01/24/12   [provider]  furosemide (LASIX) 40 MG tablet Take 1 tablet (40 mg total) by mouth daily. 07/06/21   Croitoru, Mihai, MD  levothyroxine (SYNTHROID) 25 MCG tablet 1 tablet in the morning on an empty stomach    [provider]  lidocaine (LIDODERM) 5 % Lidoderm 5 % topical patch  APPLY 1 PATCH BY TOPICAL ROUTE ONCE DAILY (MAY WEAR UP TO 12HOURS.)    [provider]  LORazepam (ATIVAN) 0.5 MG tablet Take 1 tablet (0.5 mg total) by mouth daily as needed for anxiety. 04/27/21   Leamon Arnt, MD  metoprolol succinate (TOPROL-XL) 25 MG 24 hr tablet TAKE 1 TABLET (25 MG TOTAL) BY MOUTH DAILY. 06/02/21   Troy Sine, MD  pantoprazole (PROTONIX) 40 MG tablet Take 80 mg by mouth daily. Takes 2 tablets daily    [provider]  potassium chloride SA (KLOR-CON M20) 20 MEQ tablet Take 1 tablet (20 mEq total) by mouth daily. 07/06/21 10/04/21  Croitoru, Mihai, MD  pravastatin (PRAVACHOL) 40 MG tablet 1 tablet    [provider]  simvastatin (ZOCOR) 20 MG tablet 1/2 tablet Patient not taking: Reported on 07/06/2021    [provider]  tiZANidine (ZANAFLEX) 2 MG tablet tizanidine 2 mg tablet  Take 1 tablet 3 times a day by oral route as needed. Patient not taking: Reported on 07/06/2021    [provider]  valsartan-hydrochlorothiazide (DIOVAN-HCT) 160-25 MG tablet TAKE 1 TABLET BY MOUTH EVERY DAY 05/18/21   Croitoru, Dani Gobble, MD    Physical Exam: Vitals:   07/13/21 1800 07/13/21 1830 07/13/21 1839 07/13/21 1900  BP: (!) 137/45 (!)  148/44 (!) 148/44 (!) 121/41  Pulse: 92 89 79 83  Resp: (!) 22 17 17 17   Temp:      TempSrc:      SpO2: 99% 97% 98% 92%   Constitutional: NAD, calm, comfortable Eyes: PERRL, lids and conjunctivae normal ENMT: Mucous membranes are moist. Posterior pharynx clear of any exudate or lesions.Normal dentition.  Neck: normal, supple, no masses, no thyromegaly Respiratory: clear to auscultation bilaterally, no wheezing, no crackles. Normal respiratory effort. No accessory muscle use.  Cardiovascular: Regular rate and rhythm, no murmurs / rubs / gallops. BLE edema present. 2+ pedal pulses. No carotid bruits.  Abdomen: no tenderness, no masses palpated. No hepatosplenomegaly. Bowel sounds positive.  Musculoskeletal: Pain on external rotation of R hip. Skin:  no rashes, lesions, ulcers. No induration Neurologic: CN 2-12 grossly intact. Sensation intact, DTR normal. Strength 5/5 in all 4.  Psychiatric: Normal judgment and insight. Alert and oriented x 3. Normal mood.    Data Reviewed:  See A/P below  Assessment/Plan * AKI (acute kidney injury) ([place])- (present on admission) AKI vs rapid progression of CKD. Hold diovan. Check UA Strict intake and ouput Renal US to r/o obstruction Renal US suggests possible obstruction with large volume of urine in bladder However at time of my exam after [place], it does NOT clinically seem like pt has urinary retention issues with well over 1L UOP (in container on wall) post lasix thus far.  Pt and RN confirm large UOP. Will get follow up bladder scan Lasix 40mg  x1 in ED, may need to order more in AM depending on response. Repeat BMP in AM. May warrant nephrology consult especially if 2d echo and remainder of initial workup unrevealing (see Edema discussion).  Fracture of right superior pubic ramus, closed, initial encounter ([place])- (present on admission) CT pending to confirm Non surgical per Ortho who will see her in AM Partial wt bearing at this time.  Lower  extremity edema- (present on admission) Acute on chronic. Not clear if nephrogenic vs cardiogenic, favoring nephrogenic given nl BNP, lack of pulmonary edema on CXR. Albumin 3.5. See AKI work up 2d echo Lasix 40mg  x1 in ED  Paroxysmal atrial fibrillation ([place])- (present on admission) "Very brief episodes were recorded by her pacemaker in 2012 (less than 2 minutes) they have not recurred since. She is on aspirin alone for embolism prophylaxis" Continue cardizem and metoprolol.  Essential hypertension- (present on admission) Hold Diovan due to AKI Cont other BP meds    Advance Care Planning:   Code Status: Full Code  Consults: Ortho  Family Communication: No family in room  Severity of Illness: The appropriate patient status for this patient is INPATIENT. Inpatient status is judged to be reasonable and necessary in order to provide the required intensity of service to ensure the patient's safety. The patient's presenting symptoms, physical exam findings, and initial radiographic and laboratory data in the context of their chronic comorbidities is felt to place them at high risk for further clinical deterioration. Furthermore, it is not anticipated that the patient will be medically stable for discharge from the hospital within 2 midnights of admission.   * I certify that at the point of admission it is my clinical judgment that the patient will require inpatient hospital care spanning beyond 2 midnights from the point of admission due to high intensity of service, high risk for further deterioration and high frequency of surveillance required.*  Author: Etta Quill., DO 07/13/2021 8:03 PM  For on call review [email or web address].

## 2021-07-13 NOTE — ED Notes (Signed)
Daughter Telena Peyser [phone] would like an update and notified when moved upstairs

## 2021-07-14 ENCOUNTER — Other Ambulatory Visit (HOSPITAL_COMMUNITY): Payer: Medicare Other

## 2021-07-14 ENCOUNTER — Other Ambulatory Visit: Payer: Self-pay | Admitting: Cardiovascular Disease

## 2021-07-14 DIAGNOSIS — I442 Atrioventricular block, complete: Secondary | ICD-10-CM

## 2021-07-14 DIAGNOSIS — D649 Anemia, unspecified: Secondary | ICD-10-CM

## 2021-07-14 DIAGNOSIS — E876 Hypokalemia: Secondary | ICD-10-CM

## 2021-07-14 DIAGNOSIS — E871 Hypo-osmolality and hyponatremia: Secondary | ICD-10-CM

## 2021-07-14 LAB — BASIC METABOLIC PANEL
Anion gap: 8 (ref 5–15)
BUN: 42 mg/dL — ABNORMAL HIGH (ref 8–23)
CO2: 30 mmol/L (ref 22–32)
Calcium: 8.5 mg/dL — ABNORMAL LOW (ref 8.9–10.3)
Chloride: 99 mmol/L (ref 98–111)
Creatinine, Ser: 1.26 mg/dL — ABNORMAL HIGH (ref 0.44–1.00)
GFR, Estimated: 41 mL/min — ABNORMAL LOW (ref >60)
Glucose, Bld: 93 mg/dL (ref 70–99)
Potassium: 3 mmol/L — ABNORMAL LOW (ref 3.5–5.1)
Sodium: 137 mmol/L (ref 135–145)

## 2021-07-14 MED ORDER — ENSURE ENLIVE PO LIQD
237.0000 mL | Freq: Three times a day (TID) | ORAL | Status: DC
  Administered 2021-07-14 – 2021-07-17 (×8): 237 mL via ORAL

## 2021-07-14 MED ORDER — ADULT MULTIVITAMIN W/MINERALS CH
1.0000 | ORAL_TABLET | Freq: Every day | ORAL | Status: DC
  Administered 2021-07-15 – 2021-07-17 (×3): 1 via ORAL
  Filled 2021-07-14 (×4): qty 1

## 2021-07-14 MED ORDER — FUROSEMIDE 10 MG/ML IJ SOLN
40.0000 mg | Freq: Once | INTRAMUSCULAR | Status: AC
  Administered 2021-07-14: 10:00:00 40 mg via INTRAVENOUS
  Filled 2021-07-14: qty 4

## 2021-07-14 MED ORDER — POTASSIUM CHLORIDE CRYS ER 20 MEQ PO TBCR
40.0000 meq | EXTENDED_RELEASE_TABLET | ORAL | Status: AC
  Administered 2021-07-14 (×2): 40 meq via ORAL
  Filled 2021-07-14 (×2): qty 2

## 2021-07-14 NOTE — Assessment & Plan Note (Signed)
-   Mild, on admission, in the setting of hypervolemia.  Sodium normalized with IV Lasix.  Continue to monitor.

## 2021-07-14 NOTE — Progress Notes (Signed)
Inpatient Rehab Admissions Coordinator Note:   Per therapy recommendations patient was screened for CIR candidacy by Michel Santee, PT. At this time, pt appears to be a potential candidate for CIR. I will place an order for rehab consult for full assessment, per our protocol.  Please contact me any with questions.Shann Medal, PT, DPT [phone] 07/14/21 3:01 PM

## 2021-07-14 NOTE — Progress Notes (Signed)
Patient had recent echo on 07/06/2021. Please contact echo dept if repeat is necessary. Thanks!

## 2021-07-14 NOTE — Assessment & Plan Note (Signed)
-  monitor on telemetry

## 2021-07-14 NOTE — Assessment & Plan Note (Signed)
-   No bleeding, monitor

## 2021-07-14 NOTE — Hospital Course (Signed)
88 year old female with history of HTN, HLD, sick sinus syndrome with history of pacemaker, comes into the ED with bilateral lower extremity edema as well as right hip pain.  She has been having chronic back pain for a number of weeks, had a steroid injection in her back and felt like when she was assisted onto the hard table movements were a little bit sudden.  Following that injection, has been experiencing severe right hip pain and difficulties ambulating.

## 2021-07-14 NOTE — Plan of Care (Signed)

## 2021-07-14 NOTE — Assessment & Plan Note (Signed)
Continue statin. 

## 2021-07-14 NOTE — ED Notes (Signed)
Breakfast orders placed 

## 2021-07-14 NOTE — Evaluation (Addendum)
Physical Therapy Evaluation Patient Details Name: [name] MRN: [id number] DOB: [birth year] Today's Date: 07/14/2021  History of Present Illness  88 yo female presents to [facility] on 1/23 with R hip and BLE swelling since steroid injection ~1/16. Imaging shows R superior and inferior rami fractures, PWB 50% RLE. Pt also with AKI, PMH includes HTN, HLD, afib, sick sinus syndrome and complete heart block status post pacemaker placement, CKD 3.  Clinical Impression   Pt presents with generalized weakness, severe R pelvic pain during mobility, impaired knowledge and application of WB precautions, max difficulty performing mobility tasks, impaired balance at high risk of falls, and decreased activity tolerance vs baseline. Pt to benefit from acute PT to address deficits. Pt requiring mod-max +2 for bed mobility and transfer from bed to chair, pt barely WB through RLE due to pain and using LLE as pivot point to reach chair. Pt with anxiety with mobility, significant fear of falling. Recommending AIR if pt can get increased assist at home once d/c from acute setting. Pt would need to be at mod I level to d/c home if increased support is not available at home, or 24/7 assist and at min assist level. PT to progress mobility as tolerated, and will continue to follow acutely.         Recommendations for follow up therapy are one component of a multi-disciplinary discharge planning process, led by the attending physician.  Recommendations may be updated based on patient status, additional functional criteria and insurance authorization.  Follow Up Recommendations Acute inpatient rehab (3hours/day)    Assistance Recommended at Discharge Frequent or constant Supervision/Assistance  Patient can return home with the following  A lot of help with bathing/dressing/bathroom;A lot of help with walking and/or transfers;Direct supervision/assist for financial management;Assistance with cooking/housework;Help with stairs  or ramp for entrance;Assist for transportation    Equipment Recommendations None recommended by PT  Recommendations for Other Services       Functional Status Assessment Patient has had a recent decline in their functional status and demonstrates the ability to make significant improvements in function in a reasonable and predictable amount of time.     Precautions / Restrictions Precautions Precautions: Fall Restrictions Weight Bearing Restrictions: Yes RLE Weight Bearing: Partial weight bearing RLE Partial Weight Bearing Percentage or Pounds: 50%      Mobility  Bed Mobility Overal bed mobility: Needs Assistance Bed Mobility: Supine to Sit     Supine to sit: +2 for physical assistance, Max assist     General bed mobility comments: max +2 for trunk elevation and step-wise scooting LEs to EOB, scooting to EOB with bed pad.    Transfers Overall transfer level: Needs assistance Equipment used: Rolling walker (2 wheels) Transfers: Sit to/from Stand, Bed to chair/wheelchair/BSC Sit to Stand: Mod assist, +2 physical assistance Stand pivot transfers: Mod assist, +2 physical assistance         General transfer comment: mod +2 for power up, rise, steadying, hand placement, and pivoting mostly on LLE to reach recliner towards pt's L given difficulty progressing RLE. Cues for use of UEs through RW to prevent >50% WB through RLE.    Ambulation/Gait               General Gait Details: NT  Stairs            Wheelchair Mobility    Modified Rankin (Stroke Patients Only)       Balance Overall balance assessment: Needs assistance, History of Falls Sitting-balance support: No  upper extremity supported Sitting balance-Leahy Scale: Fair Sitting balance - Comments: able to sit EOB without PT support   Standing balance support: Bilateral upper extremity supported, During functional activity, Reliant on assistive device for balance Standing balance-Leahy Scale:  Poor                               Pertinent Vitals/Pain Pain Assessment Pain Assessment: Faces Faces Pain Scale: Hurts little more Pain Location: R hip Pain Descriptors / Indicators: Sore, Discomfort Pain Intervention(s): Limited activity within patient's tolerance, Monitored during session, Repositioned    Home Living Family/patient expects to be discharged to:: Private residence Living Arrangements: Children (son works full time) Available Help at Discharge: Family;Available PRN/intermittently Type of Home: House Home Access: Stairs to enter Entrance Stairs-Rails: Psychiatric nurse of Steps: 5   Home Layout: One level Home Equipment: Conservation officer, nature (2 wheels);BSC/3in1      Prior Function Prior Level of Function : Needs assist             Mobility Comments: pt states she has been using RW on and off given back pain, has definitely been using RW since R hip pain after back injection ADLs Comments: Pt reports having assist from son for meal prep and occasionally transfers on/off toilet especially since hip fracture     Hand Dominance   Dominant Hand: Right    Extremity/Trunk Assessment   Upper Extremity Assessment Upper Extremity Assessment: Generalized weakness    Lower Extremity Assessment Lower Extremity Assessment: Generalized weakness;RLE deficits/detail RLE Deficits / Details: distal edema bilat R>L and more tender on R, able to perform quad set and limited ROM hip flexion/knee flexion given pain RLE: Unable to fully assess due to pain    Cervical / Trunk Assessment Cervical / Trunk Assessment: Kyphotic  Communication   Communication: HOH  Cognition Arousal/Alertness: Awake/alert Behavior During Therapy: WFL for tasks assessed/performed, Anxious Overall Cognitive Status: No family/caregiver present to determine baseline cognitive functioning Area of Impairment: Memory, Following commands, Problem solving                      Memory: Decreased short-term memory, Decreased recall of precautions Following Commands: Follows one step commands with increased time     Problem Solving: Requires verbal cues, Requires tactile cues, Difficulty sequencing General Comments: Pt demonstrates anxiety with mobility, suspect significant fear of falling. Pt requires frequent reminder cues for 50% PWB RLE, does not appear to recall being told precautions initially when reminded        General Comments General comments (skin integrity, edema, etc.): vss    Exercises General Exercises - Lower Extremity Long Arc Quad: AAROM, Both, 10 reps, Seated   Assessment/Plan    PT Assessment Patient needs continued PT services  PT Problem List Decreased strength;Decreased mobility;Decreased activity tolerance;Decreased balance;Decreased knowledge of use of DME;Pain;Decreased knowledge of precautions;Decreased safety awareness;Decreased skin integrity       PT Treatment Interventions Therapeutic activities;DME instruction;Gait training;Therapeutic exercise;Patient/family education;Stair training;Balance training;Functional mobility training;Neuromuscular re-education    PT Goals (Current goals can be found in the Care Plan section)  Acute Rehab PT Goals Patient Stated Goal: home PT Goal Formulation: With patient Time For Goal Achievement: 07/28/21 Potential to Achieve Goals: Good    Frequency Min 2X/week     Co-evaluation               AM-PAC PT "6 Clicks" Mobility  Outcome Measure Help needed  turning from your back to your side while in a flat bed without using bedrails?: A Lot Help needed moving from lying on your back to sitting on the side of a flat bed without using bedrails?: A Lot Help needed moving to and from a bed to a chair (including a wheelchair)?: A Lot Help needed standing up from a chair using your arms (e.g., wheelchair or bedside chair)?: A Lot Help needed to walk in hospital room?:  Total Help needed climbing 3-5 steps with a railing? : Total 6 Click Score: 10    End of Session Equipment Utilized During Treatment: Gait belt Activity Tolerance: Patient tolerated treatment well;Patient limited by pain Patient left: in chair;with call bell/phone within reach;with chair alarm set Nurse Communication: Mobility status (+2 stand pivot with use of RW back to bed) PT Visit Diagnosis: Other abnormalities of gait and mobility (R26.89);Muscle weakness (generalized) (M62.81);Pain Pain - Right/Left: Right Pain - part of body: Hip    Time: 0037-0488 PT Time Calculation (min) (ACUTE ONLY): 28 min   Charges:   PT Evaluation $PT Eval Low Complexity: 1 Low PT Treatments $Therapeutic Activity: 8-22 mins       Stacie Glaze, PT DPT Acute Rehabilitation Services Pager [phone]  Office [phone]   Roxine Caddy E Ruffin Pyo 07/14/2021, 2:16 PM

## 2021-07-14 NOTE — Assessment & Plan Note (Signed)
-  on protonix 

## 2021-07-14 NOTE — Progress Notes (Signed)
PROGRESS NOTE  [name] XYI:[id number] DOB: 1931-09-19 DOA: 07/13/2021 PCP: Leamon Arnt, MD   LOS: 1 day   Brief Narrative / Interim history: 88 year old female with history of HTN, HLD, sick sinus syndrome with history of pacemaker, comes into the ED with bilateral lower extremity edema as well as right hip pain.  She has been having chronic back pain for a number of weeks, had a steroid injection in her back and felt like when she was assisted onto the hard table movements were a little bit sudden.  Following that injection, has been experiencing severe right hip pain and difficulties ambulating.  Subjective / 24h Interval events: Doing well this morning, appreciates her legs are less swollen.  No chest pain, no shortness of breath  Assessment & Plan: * AKI (acute kidney injury) on CKD 3a ([place])- (present on admission) -AKI vs rapid progression of CKD. Baseline Cr 1.1-1.2. Continue to hold diovan. UA unremarkable.  She underwent a renal ultrasound that suggested possible obstruction with large volume of urine in the bladder, however right after the procedure she had 1 L urinary output.  Several hours later, bladder ultrasound showed 470 mL, then immediately afterwards she voided 500 mL.  There is no current concern about true obstruction.  Received Lasix, lower extremity swelling improved.  Renal function is better this morning, repeat Lasix this morning  Fracture of right superior pubic ramus, closed, initial encounter ([place])- (present on admission) -CT scan of the pelvis showed right superior and inferior pubic ramus fractures.  Patient tells me that she is having chronic back pain for which she saw a spine surgeon as an outpatient about 10 days ago, and she was assisted to get onto the table to receive a steroid injection and felt like the move was very sudden and felt like she hit the hard table too hard at that time.  She has not been able to walk since.  Orthopedic surgery consulted,  likely nonsurgical.  PT consult pending.  Essential hypertension- (present on admission) -Hold Diovan due to AKI.  Receiving Lasix, blood pressure within acceptable parameters.  Continue diltiazem, metoprolol  Paroxysmal atrial fibrillation ([place])- (present on admission) -Per chart,"Very brief episodes were recorded by her pacemaker in 2012 (less than 2 minutes) they have not recurred since. She is on aspirin alone for embolism prophylaxis". Continue cardizem and metoprolol.  Lower extremity edema- (present on admission) - This appears to be acute on chronic.  Already better this morning after receiving Lasix.  She saw her cardiologist about a week ago and has received oral Lasix but did not appreciate improvement.  Repeat IV Lasix this morning.  2D echo has been ordered and is pending.  Hypokalemia - Due to Lasix, replete potassium today and monitor tomorrow morning  Normocytic anemia - No bleeding, monitor  Hyponatremia - Mild, on admission, in the setting of hypervolemia.  Sodium normalized with IV Lasix.  Continue to monitor.  CHB (complete heart block) (HCC) s/p pacemaker- (present on admission) -monitor on telemetry  Mixed hyperlipidemia- (present on admission) - Continue statin  GERD (gastroesophageal reflux disease)- (present on admission) -on protonix    Scheduled Meds:  aspirin  81 mg Oral QHS   diltiazem  180 mg Oral QHS   enoxaparin (LOVENOX) injection  30 mg Subcutaneous Q24H   metoprolol succinate  25 mg Oral QHS   pantoprazole  80 mg Oral BID   potassium chloride  40 mEq Oral Q3H   pravastatin  40 mg Oral QHS  Continuous Infusions: PRN Meds:.HYDROcodone-acetaminophen, LORazepam, morphine injection  Diet Orders (From admission, onward)     Start     Ordered   07/13/21 1944  Diet Heart Room service appropriate? Yes; Fluid consistency: Thin  Diet effective now       Question Answer Comment  Room service appropriate? Yes   Fluid consistency: Thin       07/13/21 1944            DVT prophylaxis: enoxaparin (LOVENOX) injection 30 mg Start: 07/13/21 1945   Lab Results  Component Value Date   PLT 288 07/13/2021      Code Status: Full Code  Family Communication: No family at bedside  Status is: Inpatient  Remains inpatient appropriate because: PT eval, IV Lasix  Level of care: Telemetry Medical  Consultants:  Orthopedic surgery  Procedures:  2D echo: pending  Microbiology  none  Antimicrobials: none    Objective: Vitals:   07/14/21 0411 07/14/21 0730 07/14/21 0815 07/14/21 0815  BP: (!) 116/55 (!) 116/43  (!) 103/91  Pulse: 71 77  71  Resp: 16 (!) 21  15  Temp:    98.4 F (36.9 C)  TempSrc:    Oral  SpO2: 98% 100%  100%  Weight:   70 kg   Height:   4\' 11"  (1.499 m)     Intake/Output Summary (Last 24 hours) at 07/14/2021 0935 Last data filed at 07/13/2021 2306 Gross per 24 hour  Intake --  Output 275 ml  Net -275 ml   Wt Readings from Last 3 Encounters:  07/14/21 70 kg  07/06/21 72.1 kg  04/27/21 71.5 kg    Examination:  Constitutional: NAD Eyes: no scleral icterus ENMT: Mucous membranes are moist.  Neck: normal, supple Respiratory: clear to auscultation bilaterally, no wheezing, no crackles. Normal respiratory effort Cardiovascular: Regular rate and rhythm, no murmurs / rubs / gallops. 1+ edema LE Abdomen: non distended, no tenderness. Bowel sounds positive.  Musculoskeletal: no clubbing / cyanosis.  Skin: no rashes Neurologic: non focal    Data Reviewed: I have independently reviewed following labs and imaging studies  CBC Recent Labs  Lab 07/13/21 1000  WBC 12.2*  HGB 11.0*  HCT 33.9*  PLT 288  MCV 93.4  MCH 30.3  MCHC 32.4  RDW 13.2  LYMPHSABS 1.9  MONOABS 0.9  EOSABS 0.2  BASOSABS 0.1    Recent Labs  Lab 07/13/21 1000 07/14/21 0735  NA 134* 137  K 4.0 3.0*  CL 97* 99  CO2 23 30  GLUCOSE 138* 93  BUN 66* 42*  CREATININE 1.71* 1.26*  CALCIUM 9.2 8.5*  AST 18   --   ALT 24  --   ALKPHOS 95  --   BILITOT 0.4  --   ALBUMIN 3.5  --   BNP 23.5  --     ------------------------------------------------------------------------------------------------------------------ No results for input(s): CHOL, HDL, LDLCALC, TRIG, CHOLHDL, LDLDIRECT in the last 72 hours.  Lab Results  Component Value Date   HGBA1C 5.4 04/27/2021   ------------------------------------------------------------------------------------------------------------------ No results for input(s): TSH, T4TOTAL, T3FREE, THYROIDAB in the last 72 hours.  Invalid input(s): FREET3  Cardiac Enzymes No results for input(s): CKMB, TROPONINI, MYOGLOBIN in the last 168 hours.  Invalid input(s): CK ------------------------------------------------------------------------------------------------------------------    Component Value Date/Time   BNP 23.5 07/13/2021 1000    CBG: No results for input(s): GLUCAP in the last 168 hours.  No results found for this or any previous visit (from the past 240 hour(s)).  Radiology Studies: DG Chest 2 View  Result Date: 07/13/2021 CLINICAL DATA:  88 year old female with right leg pain. Right hip pain. EXAM: CHEST - 2 VIEW COMPARISON:  Chest radiographs 08/05/2016 and earlier. FINDINGS: Upright AP and lateral views of the chest. Right chest dual lead cardiac pacemaker is chronic. Stable cardiac size, borderline to mildly enlarged. Other mediastinal contours are within normal limits. Visualized tracheal air column is within normal limits. Lung volumes are stable and normal. Both lungs appear clear. No pneumothorax or pleural effusion. Abdominal Calcified aortic atherosclerosis. Stable cholecystectomy clips. Osteopenia. No acute osseous abnormality identified. IMPRESSION: No acute cardiopulmonary abnormality. Aortic Atherosclerosis (ICD10-I70.0). Electronically Signed   By: Genevie Ann M.D.   On: 07/13/2021 10:55   US RENAL  Result Date: 07/13/2021 CLINICAL DATA:   An 88 year old female presents for evaluation of acute kidney injury. EXAM: RENAL / URINARY TRACT ULTRASOUND COMPLETE COMPARISON:  None FINDINGS: Right Kidney: Renal measurements: 13.8 x 5.3 x 5.3 cm = volume: 204 mL. Increased echogenicity of renal cortex. Multiple renal cysts seen in the RIGHT kidney largest measuring 5.4 x 6.2 x 5.5 cm in the upper pole, next largest measuring 3.7 x 9.1 x 3.4 cm in the interpolar RIGHT kidney. No hydronephrosis. At least 2 additional renal cysts on the RIGHT. Left Kidney: Renal measurements: 11.4 x 6.1 x 7.1 cm = volume: 257 mL. Extending inferiorly from the LEFT kidney is a 9.0 x 7.3 x 8.5 cm renal cyst. In the upper pole of the LEFT kidney is a cyst measuring 3.7 x 3.5 x 3.1 cm. At least 3 additional small renal cysts to moderate-size renal cysts. Question mild LEFT collecting system dilation, difficult to assess due to body habitus and presence of renal cysts. Bladder: Moderate to marked distension of the urinary bladder with 569 cc bladder volume. Other: Mildly low nodular contour of the liver raising the question of liver disease. IMPRESSION: Moderate to marked distension of the urinary bladder, correlate with any signs of urinary retention. Question mild LEFT hydronephrosis, limited assessment in general due to patient body habitus. Renal cysts. Question of mildly nodular contour of the liver, correlate with any clinical or laboratory evidence of liver disease. Electronically Signed   By: Zetta Bills M.D.   On: 07/13/2021 20:29   CT Hip Right Wo Contrast  Result Date: 07/13/2021 CLINICAL DATA:  Right hip pain following a shooting pain in her back 1 week ago. Suspected right superior pubic ramus fracture on radiographs earlier today. EXAM: CT OF THE RIGHT HIP WITHOUT CONTRAST TECHNIQUE: Multidetector CT imaging of the right hip was performed according to the standard protocol. Multiplanar CT image reconstructions were also generated. RADIATION DOSE REDUCTION: This  exam was performed according to the departmental dose-optimization program which includes automated exposure control, adjustment of the mA and/or kV according to patient size and/or use of iterative reconstruction technique. COMPARISON:  Right hip radiographs obtained earlier today. FINDINGS: Bones/Joint/Cartilage Mildly displaced and mildly comminuted fracture of the lateral aspect of the right superior pubic ramus. Mildly comminuted, nondisplaced fracture of the right inferior pubic ramus. The anterior and posterior columns of the acetabulum are intact as is the quadrilateral plate. No hip fracture or dislocation. Ligaments Suboptimally assessed by CT. Muscles and Tendons Unremarkable. Soft tissues Distended urinary bladder. IMPRESSION: Right superior and inferior pubic ramus fractures, as described above. Electronically Signed   By: Claudie Revering M.D.   On: 07/13/2021 20:00   DG Hip Unilat W or Wo Pelvis 2-3 Views Right  Result Date:  07/13/2021 CLINICAL DATA:  Hip pain EXAM: DG HIP (WITH OR WITHOUT PELVIS) 3V RIGHT COMPARISON:  None. FINDINGS: Stable deformity of the right superior pubic ramus. Mild degenerative changes of the bilateral hips, SI joints and pubic symphysis. Degenerative changes of the partially visualized lower lumbar spine. Soft tissues are unremarkable. IMPRESSION: Step-off deformity of the right superior pubic ramus, concerning for fracture. Recommend further evaluation with cross-sectional imaging. Electronically Signed   By: Yetta Glassman M.D.   On: 07/13/2021 17:08   VAS [place] LOWER EXTREMITY VENOUS (DVT) (ONLY [facility] & [facility])  Result Date: 07/14/2021  Lower Venous DVT Study Patient Name:  [name]  Date of Exam:   07/13/2021 Medical Rec #: [id number]      Accession #:    [id number] Date of Birth: 13-Oct-1931       Patient Gender: F Patient Age:   27 years Exam Location:  [facility] Procedure:      VAS [place] LOWER EXTREMITY VENOUS (DVT) Referring Phys: Benjamine Mola HAMMOND  --------------------------------------------------------------------------------  Indications: Edema.  Risk Factors: None identified. Limitations: Body habitus, poor ultrasound/tissue interface and patient pain tolerance. Comparison Study: No prior studies. Performing Technologist: Oliver Hum RVT  Examination Guidelines: A complete evaluation includes B-mode imaging, spectral Doppler, color Doppler, and power Doppler as needed of all accessible portions of each vessel. Bilateral testing is considered an integral part of a complete examination. Limited examinations for reoccurring indications may be performed as noted. The reflux portion of the exam is performed with the patient in reverse Trendelenburg.  +---------+---------------+---------+-----------+----------+-------------------+  RIGHT     Compressibility Phasicity Spontaneity Properties Thrombus Aging       +---------+---------------+---------+-----------+----------+-------------------+  CFV       Full            Yes       Yes                                         +---------+---------------+---------+-----------+----------+-------------------+  SFJ       Full                                                                  +---------+---------------+---------+-----------+----------+-------------------+  FV Prox   Full                                                                  +---------+---------------+---------+-----------+----------+-------------------+  FV Mid    Full                                                                  +---------+---------------+---------+-----------+----------+-------------------+  FV Distal                 Yes       Yes                                         +---------+---------------+---------+-----------+----------+-------------------+  PFV       Full                                                                  +---------+---------------+---------+-----------+----------+-------------------+  POP       Full             Yes       Yes                                         +---------+---------------+---------+-----------+----------+-------------------+  PTV       Full                                                                  +---------+---------------+---------+-----------+----------+-------------------+  PERO                                                       Not well visualized  +---------+---------------+---------+-----------+----------+-------------------+   +----+---------------+---------+-----------+----------+--------------+  LEFT Compressibility Phasicity Spontaneity Properties Thrombus Aging  +----+---------------+---------+-----------+----------+--------------+  CFV  Full            Yes       Yes                                    +----+---------------+---------+-----------+----------+--------------+    Summary: RIGHT: - There is no evidence of deep vein thrombosis in the lower extremity. However, portions of this examination were limited- see technologist comments above.  - No cystic structure found in the popliteal fossa.  LEFT: - No evidence of common femoral vein obstruction.  *See table(s) above for measurements and observations. Electronically signed by Orlie Pollen on 07/14/2021 at 6:52:13 AM.    Final      Marzetta Board, MD, PhD [facility]  Between 7 am - 7 pm I am available, please contact me via Amion (for emergencies) or Securechat (non urgent messages)  Between 7 pm - 7 am I am not available, please contact night coverage MD/APP via Amion

## 2021-07-14 NOTE — Consult Note (Signed)
Reason for Consult: right pubic rami fractures Referring Physician: Cruzita Lederer, MD (Hospitalist)  [name] is an 88 y.o. female.  HPI: Pt presents to the ED with c/o BLE edema and R hip pain.   Patient received a steroid injection in her back approximately 1 week ago and has had progressively worsening right lower extremity pain that has now impeded her ability to walk.  She feels that perhaps that this all began when they got her on the fluoro table for the injection.    She has had worsening lower extremity edema despite doubling her Lasix.   She denies shortness of breath, numbness, tingling, weakness of lower extremity.  Denies fever, chest pain, headache or other systemic symptoms.    Past Medical History:  Diagnosis Date   Allergic rhinitis    Anginal pain (HCC)    Asthmatic bronchitis    Chronic cough    Dysrhythmia    PAF   Esophageal reflux    GERD (gastroesophageal reflux disease)    History of stress test 11/20/2009   This was essentially normal,post-stress EF was hyperdynamic at 73 beats per mins, There was no scar or ischemia.   Hx of echocardiogram 11/05/2010   EF>55% showed a normal systolic function with impaired diastolic dysfuntion and also tissue doppler suggesting increased elevated left atrial pressure, she had moderate LA dilatation and mild LA dilatation. There was calcified mitral apparatus with moderate MR, mild to moderate TR and trace aortic insufficiency.   Hyperlipemia    Hypertension    Hypothyroidism    PAF (paroxysmal atrial fibrillation) ([place])    Pneumonia     Past Surgical History:  Procedure Laterality Date   CHOLECYSTECTOMY     CYSTOSCOPY WITH BIOPSY N/A 08/29/2020   Procedure: CYSTOSCOPY WITH BIOPSY WITH FULGURATION;  Surgeon: Irine Seal, MD;  Location: [facility];  Service: Urology;  Laterality: N/A;   FOOT SURGERY     right   NASAL SEPTUM SURGERY     PACEMAKER INSERTION  June 1st 2009   St Jude Coffeen device, model #5826, serial  #[id number], The artial lead is a Interior and spatial designer, the ventricular lead is Environmental education officer.   PPM GENERATOR CHANGEOUT N/A 02/09/2017   Procedure: PPM GENERATOR CHANGEOUT;  Surgeon: Sanda Klein, MD;  Location: [facility];  Service: Cardiovascular;  Laterality: N/A;   THYROID SURGERY     TONSILLECTOMY     TONSILLECTOMY     VESICOVAGINAL FISTULA CLOSURE W/ TAH      Family History  Problem Relation Age of Onset   Stroke Mother    Cancer Father        bladder   Stroke Sister     Social History:  reports that she has never smoked. She has never used smokeless tobacco. She reports that she does not drink alcohol and does not use drugs.  Allergies:  Allergies  Allergen Reactions   Fluticasone-Salmeterol Other (See Comments)    unknown Other reaction(s): tongue/throat symptoms   Sulfa Antibiotics     Other reaction(s): gastric intol   Sulfonamide Derivatives Other (See Comments)    unknown   Benzonatate Other (See Comments)    tongue/throat symptoms in the past but tolerated more recently Other reaction(s): and tongue/throat symptoms in the past (not more recently)   Erythromycin Nausea And Vomiting and Other (See Comments)    Stomach cramps Other reaction(s): gastric intol   Meloxicam Other (See Comments)    Mild dyspnea Other reaction(s): dyspnea   Sulfamethoxazole Other (  See Comments)    Gastric intolerance    Medications: I have reviewed the patient's current medications. Scheduled:  aspirin  81 mg Oral QHS   diltiazem  180 mg Oral QHS   enoxaparin (LOVENOX) injection  30 mg Subcutaneous Q24H   metoprolol succinate  25 mg Oral QHS   pantoprazole  80 mg Oral BID   pravastatin  40 mg Oral QHS    Results for orders placed or performed during the hospital encounter of 07/13/21 (from the past 24 hour(s))  Brain natriuretic peptide     Status: None   Collection Time: 07/13/21 10:00 AM  Result Value Ref Range   B Natriuretic Peptide 23.5 0.0 - 100.0 pg/mL   Comprehensive metabolic panel     Status: Abnormal   Collection Time: 07/13/21 10:00 AM  Result Value Ref Range   Sodium 134 (L) 135 - 145 mmol/L   Potassium 4.0 3.5 - 5.1 mmol/L   Chloride 97 (L) 98 - 111 mmol/L   CO2 23 22 - 32 mmol/L   Glucose, Bld 138 (H) 70 - 99 mg/dL   BUN 66 (H) 8 - 23 mg/dL   Creatinine, Ser 1.71 (H) 0.44 - 1.00 mg/dL   Calcium 9.2 8.9 - 10.3 mg/dL   Total Protein 6.9 6.5 - 8.1 g/dL   Albumin 3.5 3.5 - 5.0 g/dL   AST 18 15 - 41 U/L   ALT 24 0 - 44 U/L   Alkaline Phosphatase 95 38 - 126 U/L   Total Bilirubin 0.4 0.3 - 1.2 mg/dL   GFR, Estimated 29 (L) >60 mL/min   Anion gap 14 5 - 15  CBC with Differential     Status: Abnormal   Collection Time: 07/13/21 10:00 AM  Result Value Ref Range   WBC 12.2 (H) 4.0 - 10.5 K/uL   RBC 3.63 (L) 3.87 - 5.11 MIL/uL   Hemoglobin 11.0 (L) 12.0 - 15.0 g/dL   HCT 33.9 (L) 36.0 - 46.0 %   MCV 93.4 80.0 - 100.0 fL   MCH 30.3 26.0 - 34.0 pg   MCHC 32.4 30.0 - 36.0 g/dL   RDW 13.2 11.5 - 15.5 %   Platelets 288 150 - 400 K/uL   nRBC 0.0 0.0 - 0.2 %   Neutrophils Relative % 74 %   Neutro Abs 9.0 (H) 1.7 - 7.7 K/uL   Lymphocytes Relative 15 %   Lymphs Abs 1.9 0.7 - 4.0 K/uL   Monocytes Relative 8 %   Monocytes Absolute 0.9 0.1 - 1.0 K/uL   Eosinophils Relative 1 %   Eosinophils Absolute 0.2 0.0 - 0.5 K/uL   Basophils Relative 1 %   Basophils Absolute 0.1 0.0 - 0.1 K/uL   Immature Granulocytes 1 %   Abs Immature Granulocytes 0.12 (H) 0.00 - 0.07 K/uL  Troponin I (High Sensitivity)     Status: None   Collection Time: 07/13/21 10:00 AM  Result Value Ref Range   Troponin I (High Sensitivity) 12 <18 ng/L  Troponin I (High Sensitivity)     Status: Abnormal   Collection Time: 07/13/21 12:56 PM  Result Value Ref Range   Troponin I (High Sensitivity) 19 (H) <18 ng/L  Urinalysis, Complete w Microscopic     Status: Abnormal   Collection Time: 07/13/21  7:31 PM  Result Value Ref Range   Color, Urine STRAW (A) YELLOW    APPearance CLEAR CLEAR   Specific Gravity, Urine 1.011 1.005 - 1.030   pH 6.0 5.0 - 8.0  Glucose, UA NEGATIVE NEGATIVE mg/dL   Hgb urine dipstick NEGATIVE NEGATIVE   Bilirubin Urine NEGATIVE NEGATIVE   Ketones, ur NEGATIVE NEGATIVE mg/dL   Protein, ur NEGATIVE NEGATIVE mg/dL   Nitrite NEGATIVE NEGATIVE   Leukocytes,Ua NEGATIVE NEGATIVE   RBC / HPF 0-5 0 - 5 RBC/hpf   WBC, UA 0-5 0 - 5 WBC/hpf   Bacteria, UA NONE SEEN NONE SEEN    X-ray: CLINICAL DATA:  Right hip pain following a shooting pain in her back 1 week ago. Suspected right superior pubic ramus fracture on radiographs earlier today.   EXAM: CT OF THE RIGHT HIP WITHOUT CONTRAST   TECHNIQUE: Multidetector CT imaging of the right hip was performed according to the standard protocol. Multiplanar CT image reconstructions were also generated.   RADIATION DOSE REDUCTION: This exam was performed according to the departmental dose-optimization program which includes automated exposure control, adjustment of the mA and/or kV according to patient size and/or use of iterative reconstruction technique.   COMPARISON:  Right hip radiographs obtained earlier today.   FINDINGS: Bones/Joint/Cartilage   Mildly displaced and mildly comminuted fracture of the lateral aspect of the right superior pubic ramus. Mildly comminuted, nondisplaced fracture of the right inferior pubic ramus. The anterior and posterior columns of the acetabulum are intact as is the quadrilateral plate. No hip fracture or dislocation.   Ligaments   Suboptimally assessed by CT.   Muscles and Tendons   Unremarkable.   Soft tissues   Distended urinary bladder.   IMPRESSION: Right superior and inferior pubic ramus fractures, as described above.     Electronically Signed   By: Claudie Revering M.D.  CLINICAL DATA:  Hip pain   EXAM: DG HIP (WITH OR WITHOUT PELVIS) 3V RIGHT   COMPARISON:  None.   FINDINGS: Stable deformity of the right superior  pubic ramus. Mild degenerative changes of the bilateral hips, SI joints and pubic symphysis. Degenerative changes of the partially visualized lower lumbar spine. Soft tissues are unremarkable.   IMPRESSION: Step-off deformity of the right superior pubic ramus, concerning for fracture. Recommend further evaluation with cross-sectional imaging.     Electronically Signed   By: Yetta Glassman M.D.  ROS:  As mentioned in the history of present illness. All other systems reviewed and are negative.  Blood pressure (!) 116/55, pulse 71, temperature 97.9 F (36.6 C), temperature source Oral, resp. rate 16, SpO2 98 %.  Physical Exam: Constitutional: NAD, calm, comfortable Eyes: PERRL, lids and conjunctivae normal ENMT: Mucous membranes are moist. Posterior pharynx clear of any exudate or lesions.Normal dentition.  Neck: normal, supple, no masses, no thyromegaly Respiratory: clear to auscultation bilaterally, no wheezing, no crackles. Normal respiratory effort. No accessory muscle use.  Cardiovascular: Regular rate and rhythm, no murmurs / rubs / gallops. BLE edema present. 2+ pedal pulses. No carotid bruits.  Abdomen: no tenderness, no masses palpated. No hepatosplenomegaly. Bowel sounds positive.  Musculoskeletal: Pain on external rotation of R hip. Skin: no rashes, lesions, ulcers. No induration Neurologic: CN 2-12 grossly intact. Sensation intact, DTR normal. Strength 5/5 in all 4.  Psychiatric: Normal judgment and insight. Alert and oriented x 3. Normal mood.   Assessment/Plan: Right superior and inferior rami fractures  Plan: Given the proximity of the fractures to her acetabulum laterally I would have her be PWB (50%) on the RLE as best as possible until follow up radiographs indicate some healing PT consult for above in hospital  RTC in 3-4 weeks   Mauri Pole 07/14/2021,  7:16 AM

## 2021-07-14 NOTE — Assessment & Plan Note (Signed)
-   Due to Lasix, replete potassium today and monitor tomorrow morning

## 2021-07-14 NOTE — Progress Notes (Signed)
Initial Nutrition Assessment  DOCUMENTATION CODES:   Non-severe (moderate) malnutrition in context of social or environmental circumstances  INTERVENTION:  -Ensure Enlive po BID, each supplement provides 350 kcal and 20 grams of protein -MVI with minerals daily -provided education on optimum nutrition for healing   NUTRITION DIAGNOSIS:   Moderate Malnutrition related to social / environmental circumstances as evidenced by mild fat depletion, mild muscle depletion.  GOAL:   Patient will meet greater than or equal to 90% of their needs  MONITOR:   PO intake, Supplement acceptance, Labs, Weight trends, I & O's  REASON FOR ASSESSMENT:   Consult Assessment of nutrition requirement/status, Hip fracture protocol  ASSESSMENT:   Pt with PMH significant for HTN, HLD, sick sinus syndrome with history of pacemaker admitted with AKI on CKD stg III3 and closed R superior pubic ramus fx  Pt reports appetite has been decreased for some time, but unable to quantify length of time or provide details of recent meals. Pt states she lives with her son and is often only given the option of what he would like to eat, which is typically takeout/fast food and saltier than what pt would like.  Attempted to brainstorm solutions but pt resistant to change at this time. Pt is agreeable to trial of ensure, however, after discussion regarding importance of adequate protein/nutrition for healing.    Weight history reviewed. No significant weight changes noted.   PO Intake: none documented, though RD observed 75% lunch completion today    Edema: mild pitting edema to BLE per RN assessment  Medications:  aspirin  81 mg Oral QHS   diltiazem  180 mg Oral QHS   enoxaparin (LOVENOX) injection  30 mg Subcutaneous Q24H   metoprolol succinate  25 mg Oral QHS   pantoprazole  80 mg Oral BID   pravastatin  40 mg Oral QHS    Labs: Recent Labs  Lab 07/13/21 1000 07/14/21 0735  NA 134* 137  K 4.0 3.0*  CL  97* 99  CO2 23 30  BUN 66* 42*  CREATININE 1.71* 1.26*  CALCIUM 9.2 8.5*  GLUCOSE 138* 93       NUTRITION - FOCUSED PHYSICAL EXAM:  Flowsheet Row Most Recent Value  Orbital Region Moderate depletion  Upper Arm Region Mild depletion  Thoracic and Lumbar Region No depletion  Buccal Region Mild depletion  Temple Region No depletion  Clavicle Bone Region Mild depletion  Clavicle and Acromion Bone Region Mild depletion  Scapular Bone Region Mild depletion  Dorsal Hand Severe depletion  Patellar Region No depletion  Anterior Thigh Region No depletion  Posterior Calf Region No depletion  Edema (RD Assessment) Mild  Hair Reviewed  Eyes Reviewed  Mouth Reviewed  Skin Reviewed  Nails Reviewed       Diet Order:   Diet Order             Diet Heart Room service appropriate? Yes; Fluid consistency: Thin  Diet effective now                   EDUCATION NEEDS:   Education needs have been addressed  Skin:  Skin Assessment: Reviewed RN Assessment  Last BM:  PTA  Height:   Ht Readings from Last 1 Encounters:  07/14/21 4\' 11"  (1.499 m)    Weight:   Wt Readings from Last 1 Encounters:  07/14/21 70 kg    BMI:  Body mass index is 31.17 kg/m.  Estimated Nutritional Needs:   Kcal:  9629-5284  Protein:  80-95 grams  Fluid:  >1.6L     Theone Stanley., MS, RD, LDN (she/her/hers) RD pager number and weekend/on-call pager number located in [place].

## 2021-07-15 ENCOUNTER — Inpatient Hospital Stay (HOSPITAL_COMMUNITY): Payer: Medicare Other

## 2021-07-15 DIAGNOSIS — I5030 Unspecified diastolic (congestive) heart failure: Secondary | ICD-10-CM | POA: Diagnosis not present

## 2021-07-15 LAB — CBC
HCT: 30.1 % — ABNORMAL LOW (ref 36.0–46.0)
Hemoglobin: 10 g/dL — ABNORMAL LOW (ref 12.0–15.0)
MCH: 31.1 pg (ref 26.0–34.0)
MCHC: 33.2 g/dL (ref 30.0–36.0)
MCV: 93.5 fL (ref 80.0–100.0)
Platelets: 251 10*3/uL (ref 150–400)
RBC: 3.22 MIL/uL — ABNORMAL LOW (ref 3.87–5.11)
RDW: 12.8 % (ref 11.5–15.5)
WBC: 9.4 10*3/uL (ref 4.0–10.5)
nRBC: 0 % (ref 0.0–0.2)

## 2021-07-15 LAB — ECHOCARDIOGRAM COMPLETE
AR max vel: 2.66 cm2
AV Peak grad: 9.7 mmHg
Ao pk vel: 1.56 m/s
Area-P 1/2: 3.28 cm2
Height: 59 in
S' Lateral: 3 cm
Weight: 2469.15 oz

## 2021-07-15 LAB — BASIC METABOLIC PANEL
Anion gap: 6 (ref 5–15)
BUN: 42 mg/dL — ABNORMAL HIGH (ref 8–23)
CO2: 27 mmol/L (ref 22–32)
Calcium: 8.3 mg/dL — ABNORMAL LOW (ref 8.9–10.3)
Chloride: 99 mmol/L (ref 98–111)
Creatinine, Ser: 1.3 mg/dL — ABNORMAL HIGH (ref 0.44–1.00)
GFR, Estimated: 40 mL/min — ABNORMAL LOW (ref >60)
Glucose, Bld: 149 mg/dL — ABNORMAL HIGH (ref 70–99)
Potassium: 4.3 mmol/L (ref 3.5–5.1)
Sodium: 132 mmol/L — ABNORMAL LOW (ref 135–145)

## 2021-07-15 LAB — MAGNESIUM: Magnesium: 1.9 mg/dL (ref 1.7–2.4)

## 2021-07-15 MED ORDER — METOPROLOL TARTRATE 5 MG/5ML IV SOLN: 2.5000 mg | Freq: Four times a day (QID) | INTRAVENOUS | Status: DC | PRN

## 2021-07-15 NOTE — Plan of Care (Signed)

## 2021-07-15 NOTE — Plan of Care (Signed)
°  Problem: Education: Goal: Knowledge of General Education information will improve Description: Including pain rating scale, medication(s)/side effects and non-pharmacologic comfort measures Outcome: Not Progressing   Problem: Health Behavior/Discharge Planning: Goal: Ability to manage health-related needs will improve Outcome: Not Progressing   Problem: Clinical Measurements: Goal: Ability to maintain clinical measurements within normal limits will improve Outcome: Not Progressing Goal: Will remain free from infection Outcome: Not Progressing   

## 2021-07-15 NOTE — Progress Notes (Signed)
Echocardiogram 2D Echocardiogram has been performed.  [name] 07/15/2021, 11:22 AM

## 2021-07-15 NOTE — Progress Notes (Signed)
Inpatient Rehabilitation Admissions Coordinator   I met with patient at bedside for rehab assessment. We discussed goals and expectations of a possible [facility] admit. She requests I call her son, Shanon Brow, after lunch today to further discuss with him . Her daughter in [place] has planned to come assist once patient returns home. I will call son and then discuss with Rehab team candidacy and return tomorrow.  Danne Baxter, RN, MSN Rehab Admissions Coordinator [phone] 07/15/2021 11:58 AM

## 2021-07-15 NOTE — Progress Notes (Addendum)
PROGRESS NOTE  [name] GYJ:[id number] DOB: 1932-05-24 DOA: 07/13/2021 PCP: Leamon Arnt, MD   LOS: 2 days   Brief Narrative / Interim history: 88 year old female with history of HTN, HLD, sick sinus syndrome with history of pacemaker, comes into the ED with bilateral lower extremity edema as well as right hip pain.  She has been having chronic back pain for a number of weeks, had a steroid injection in her back and felt like when she was assisted onto the hard table movements were a little bit sudden.  Following that injection, has been experiencing severe right hip pain and difficulties ambulating.  Subjective / 24h Interval events: Patient was seen and examined at bedside this morning.  She denies having any pain at the time of this visit.  States the pain is present when she moves.  She was seen by PT with recommendation for inpatient rehab.  Assessment & Plan: * AKI (acute kidney injury) on CKD 3a ([place])- (present on admission) -AKI vs rapid progression of CKD. Baseline Cr 1.1-1.2. Continue to hold diovan. UA unremarkable.  She underwent a renal ultrasound that suggested possible obstruction with large volume of urine in the bladder, however right after the procedure she had 1 L urinary output.  Several hours later, bladder ultrasound showed 470 mL, then immediately afterwards she voided 500 mL.  There is no current concern about true obstruction.  Received Lasix, lower extremity swelling improved.  Renal function is better this morning, repeat Lasix this morning  Hypokalemia - Due to Lasix, replete potassium today and monitor tomorrow morning  Normocytic anemia - No bleeding, monitor  Hyponatremia - Mild, on admission, in the setting of hypervolemia.  Sodium normalized with IV Lasix.  Continue to monitor.  Fracture of right superior pubic ramus, closed, initial encounter ([place])- (present on admission) -CT scan of the pelvis showed right superior and inferior pubic ramus  fractures.  Patient tells me that she is having chronic back pain for which she saw a spine surgeon as an outpatient about 10 days ago, and she was assisted to get onto the table to receive a steroid injection and felt like the move was very sudden and felt like she hit the hard table too hard at that time.  She has not been able to walk since.  Orthopedic surgery consulted, likely nonsurgical.  PT consult pending.  Lower extremity edema- (present on admission) - This appears to be acute on chronic.  Already better this morning after receiving Lasix.  She saw her cardiologist about a week ago and has received oral Lasix but did not appreciate improvement.  Repeat IV Lasix this morning.  2D echo has been ordered and is pending.  Paroxysmal atrial fibrillation ([place])- (present on admission) -Per chart,"Very brief episodes were recorded by her pacemaker in 2012 (less than 2 minutes) they have not recurred since. She is on aspirin alone for embolism prophylaxis". Continue cardizem and metoprolol.  CHB (complete heart block) (HCC) s/p pacemaker- (present on admission) -monitor on telemetry  Mixed hyperlipidemia- (present on admission) - Continue statin  Essential hypertension- (present on admission) -Hold Diovan due to AKI.  Receiving Lasix, blood pressure within acceptable parameters.  Continue diltiazem, metoprolol  GERD (gastroesophageal reflux disease)- (present on admission) -on protonix  Fracture of right superior pubic ramus, closed, initial encounter ([place])- (present on admission), suspect pathological fracture. Pain is well controlled PT recommending inpatient rehab Will continue PT OT with assistance and fall precautions. She was seen by orthopedic surgery who recommended  50% weightbearing in right lower extremity. Continue analgesics as needed  Scheduled Meds:  aspirin  81 mg Oral QHS   diltiazem  180 mg Oral QHS   enoxaparin (LOVENOX) injection  30 mg Subcutaneous Q24H   feeding  supplement  237 mL Oral TID BM   metoprolol succinate  25 mg Oral QHS   multivitamin with minerals  1 tablet Oral Daily   pantoprazole  80 mg Oral BID   pravastatin  40 mg Oral QHS   Continuous Infusions: PRN Meds:.HYDROcodone-acetaminophen, LORazepam, morphine injection  Diet Orders (From admission, onward)     Start     Ordered   07/13/21 1944  Diet Heart Room service appropriate? Yes; Fluid consistency: Thin  Diet effective now       Question Answer Comment  Room service appropriate? Yes   Fluid consistency: Thin      07/13/21 1944            DVT prophylaxis: enoxaparin (LOVENOX) injection 30 mg Start: 07/13/21 1945   Lab Results  Component Value Date   PLT 251 07/15/2021      Code Status: Full Code  Family Communication: No family at bedside  Status is: Inpatient  Remains inpatient appropriate because: Pending CIR evaluation.  Level of care: Telemetry Medical  Consultants:  Orthopedic surgery CIR  Procedures:  2D echo:  Microbiology  none  Antimicrobials: none    Objective: Vitals:   07/14/21 1939 07/14/21 2344 07/15/21 0400 07/15/21 0729  BP: (!) 120/41 (!) 108/44 (!) 106/46 (!) 133/44  Pulse: 73 73 (!) 51 74  Resp: 17 15 17    Temp: 98 F (36.7 C) 97.7 F (36.5 C) 97.7 F (36.5 C) 97.7 F (36.5 C)  TempSrc: Oral Oral Oral Oral  SpO2: 98% 96% 97% 96%  Weight:      Height:        Intake/Output Summary (Last 24 hours) at 07/15/2021 1050 Last data filed at 07/14/2021 1857 Gross per 24 hour  Intake 480 ml  Output 900 ml  Net -420 ml   Wt Readings from Last 3 Encounters:  07/14/21 70 kg  07/06/21 72.1 kg  04/27/21 71.5 kg    Examination:  Constitutional: Well-developed well-nourished no acute distress.  She is alert and oriented x3. Eyes: no scleral icterus ENMT: Mucous membranes are moist.  Neck: normal, supple Respiratory: Clear to auscultation with no wheezes or rales.  Good inspiratory effort.   Cardiovascular: Regular  rate and rhythm no rubs or gallops.   Abdomen: Nondistended nontender normal bowel sounds present  musculoskeletal: Trace edema in lower extremities bilaterally Skin: No rashes or ulcerative lesions noted. Neurologic: Alert and awake.  Nonfocal exam.   Data Reviewed: I have independently reviewed following labs and imaging studies  CBC Recent Labs  Lab 07/13/21 1000 07/15/21 0207  WBC 12.2* 9.4  HGB 11.0* 10.0*  HCT 33.9* 30.1*  PLT 288 251  MCV 93.4 93.5  MCH 30.3 31.1  MCHC 32.4 33.2  RDW 13.2 12.8  LYMPHSABS 1.9  --   MONOABS 0.9  --   EOSABS 0.2  --   BASOSABS 0.1  --     Recent Labs  Lab 07/13/21 1000 07/14/21 0735 07/15/21 0207  NA 134* 137 132*  K 4.0 3.0* 4.3  CL 97* 99 99  CO2 23 30 27   GLUCOSE 138* 93 149*  BUN 66* 42* 42*  CREATININE 1.71* 1.26* 1.30*  CALCIUM 9.2 8.5* 8.3*  AST 18  --   --  ALT 24  --   --   ALKPHOS 95  --   --   BILITOT 0.4  --   --   ALBUMIN 3.5  --   --   BNP 23.5  --   --     ------------------------------------------------------------------------------------------------------------------ No results for input(s): CHOL, HDL, LDLCALC, TRIG, CHOLHDL, LDLDIRECT in the last 72 hours.  Lab Results  Component Value Date   HGBA1C 5.4 04/27/2021   ------------------------------------------------------------------------------------------------------------------ No results for input(s): TSH, T4TOTAL, T3FREE, THYROIDAB in the last 72 hours.  Invalid input(s): FREET3  Cardiac Enzymes No results for input(s): CKMB, TROPONINI, MYOGLOBIN in the last 168 hours.  Invalid input(s): CK ------------------------------------------------------------------------------------------------------------------    Component Value Date/Time   BNP 23.5 07/13/2021 1000    CBG: No results for input(s): GLUCAP in the last 168 hours.  No results found for this or any previous visit (from the past 240 hour(s)).   Radiology Studies: No results  found.   Marzetta Board, MD, PhD [facility]  Between 7 am - 7 pm I am available, please contact me via Amion (for emergencies) or Securechat (non urgent messages)  Between 7 pm - 7 am I am not available, please contact night coverage MD/APP via Amion

## 2021-07-15 NOTE — Evaluation (Addendum)
Occupational Therapy Evaluation Patient Details Name: [name] MRN: [id number] DOB: [birth year] Today's Date: 07/15/2021   History of Present Illness 88 yo female presents to [facility] on 1/23 with R hip and BLE swelling since steroid injection ~1/16. Imaging shows R superior and inferior rami fractures, PWB 50% RLE. Pt also with AKI, PMH includes HTN, HLD, afib, sick sinus syndrome and complete heart block status post pacemaker placement, CKD 3.   Clinical Impression   Pt reports receiving recent assistance at baseline with ADLs and functional mobility, assistance to get on/off commode and reports using walker and w/c for mobility. Pt's son lives with her and cannot provide much assistance at d/c due to work schedule. Pt currently min-mod A for ADLs, min A for bed mobility, and min A for stand pivot transfer. Requires increased cuing for hand placement on RW and for WB precautions. Pt tachycardic during session, HR in 150's with transfer pt asymptomatic and RN aware. Pt presenting with impairments listed below, will follow acutely. Recommend AIR/CIR at d/c.     Recommendations for follow up therapy are one component of a multi-disciplinary discharge planning process, led by the attending physician.  Recommendations may be updated based on patient status, additional functional criteria and insurance authorization.   Follow Up Recommendations  Acute inpatient rehab (3hours/day)    Assistance Recommended at Discharge Intermittent Supervision/Assistance  Patient can return home with the following A little help with bathing/dressing/bathroom;A little help with walking and/or transfers;Assistance with cooking/housework;Assist for transportation;Help with stairs or ramp for entrance;Direct supervision/assist for medications management    Functional Status Assessment  Patient has had a recent decline in their functional status and demonstrates the ability to make significant improvements in function in a  reasonable and predictable amount of time.  Equipment Recommendations  Tub/shower seat;Other (comment) (RW)    Recommendations for Other Services PT consult;Rehab consult     Precautions / Restrictions Precautions Precautions: Fall Restrictions Weight Bearing Restrictions: Yes RLE Weight Bearing: Partial weight bearing RLE Partial Weight Bearing Percentage or Pounds: 50      Mobility Bed Mobility Overal bed mobility: Needs Assistance Bed Mobility: Supine to Sit     Supine to sit: Min assist     General bed mobility comments: able to come EOB with increased time    Transfers Overall transfer level: Needs assistance Equipment used: Rolling walker (2 wheels) Transfers: Sit to/from Stand, Bed to chair/wheelchair/BSC Sit to Stand: Min assist, Mod assist Stand pivot transfers: Min assist, Mod assist         General transfer comment: cues for hand placement on RW as pt tries to reach for therapist's arm      Balance Overall balance assessment: Needs assistance, History of Falls Sitting-balance support: No upper extremity supported Sitting balance-Leahy Scale: Good Sitting balance - Comments: sits unsupported EOB   Standing balance support: Bilateral upper extremity supported, During functional activity, Reliant on assistive device for balance Standing balance-Leahy Scale: Poor                             ADL either performed or assessed with clinical judgement   ADL Overall ADL's : Needs assistance/impaired Eating/Feeding: Set up;Sitting   Grooming: Set up;Sitting   Upper Body Bathing: Minimal assistance;Sitting   Lower Body Bathing: Moderate assistance;Sitting/lateral leans   Upper Body Dressing : Minimal assistance;Sitting Upper Body Dressing Details (indicate cue type and reason): dons fresh gown sitting EOB Lower Body Dressing: Maximal assistance;Sit to/from  stand Lower Body Dressing Details (indicate cue type and reason): to don socks  EOB Toilet Transfer: Cueing for safety;Cueing for sequencing;BSC/3in1;Rolling walker (2 wheels);Stand-pivot;Minimal assistance Toilet Transfer Details (indicate cue type and reason): simulated to recliner Toileting- Clothing Manipulation and Hygiene: Maximal assistance;Sitting/lateral lean Toileting - Clothing Manipulation Details (indicate cue type and reason): max A to manage clothing prior to sitting     Functional mobility during ADLs: Minimal assistance       Vision   Vision Assessment?: No apparent visual deficits     Perception Perception Perception: Not tested   Praxis Praxis Praxis: Not tested    Pertinent Vitals/Pain Pain Assessment Pain Assessment: Faces Pain Score: 3  Faces Pain Scale: Hurts little more Pain Location: R hip Pain Descriptors / Indicators: Sore, Discomfort Pain Intervention(s): Limited activity within patient's tolerance, Monitored during session, Repositioned     Hand Dominance Right   Extremity/Trunk Assessment Upper Extremity Assessment Upper Extremity Assessment: Generalized weakness   Lower Extremity Assessment Lower Extremity Assessment: Defer to PT evaluation   Cervical / Trunk Assessment Cervical / Trunk Assessment: Kyphotic   Communication Communication Communication: HOH   Cognition Arousal/Alertness: Awake/alert Behavior During Therapy: WFL for tasks assessed/performed, Anxious   Area of Impairment: Memory                     Memory: Decreased recall of precautions         General Comments: decreased recall of precautions, required reminders throughout session     General Comments  tachycardic with activity into 150's with transfer, per RN pt has been tachycardic all day however asymptomatic    Exercises     Shoulder Instructions      Home Living Family/patient expects to be discharged to:: Private residence Living Arrangements: Children Available Help at Discharge: Family;Available  PRN/intermittently Type of Home: House Home Access: Stairs to enter Entrance Stairs-Number of Steps: 5   Home Layout: One level     Bathroom Shower/Tub: Walk-in shower         Home Equipment: Conservation officer, nature (2 wheels);BSC/3in1;Wheelchair - manual;Standard Administrator, sports Comments: states her walker does not have wheels      Prior Functioning/Environment Prior Level of Function : Needs assist             Mobility Comments: has been using RW and w/c for functional mobility ADLs Comments: Pt reports having assist from son for meal prep and occasionally transfers on/off toilet especially since hip fracture        OT Problem List: Decreased strength;Decreased range of motion;Impaired balance (sitting and/or standing);Decreased activity tolerance;Decreased knowledge of use of DME or AE;Decreased safety awareness;Decreased knowledge of precautions      OT Treatment/Interventions: Self-care/ADL training;Therapeutic exercise;Energy conservation;Therapeutic activities;Patient/family education;Balance training    OT Goals(Current goals can be found in the care plan section) Acute Rehab OT Goals Patient Stated Goal: none stated OT Goal Formulation: With patient Time For Goal Achievement: 07/28/21 Potential to Achieve Goals: Good ADL Goals Pt Will Perform Upper Body Dressing: with supervision;sitting Pt Will Perform Lower Body Dressing: sit to/from stand;with mod assist Pt Will Transfer to Toilet: stand pivot transfer;with min guard assist Pt Will Perform Tub/Shower Transfer: with min assist;ambulating;rolling walker;shower seat  OT Frequency: Min 3X/week    Co-evaluation              AM-PAC OT "6 Clicks" Daily Activity     Outcome Measure Help from another person eating meals?: None Help from another person taking  care of personal grooming?: A Little Help from another person toileting, which includes using toliet, bedpan, or urinal?: A Little Help from another  person bathing (including washing, rinsing, drying)?: A Lot Help from another person to put on and taking off regular upper body clothing?: A Little Help from another person to put on and taking off regular lower body clothing?: A Lot 6 Click Score: 17   End of Session Equipment Utilized During Treatment: Gait belt;Rolling walker (2 wheels) Nurse Communication: Mobility status  Activity Tolerance: Patient tolerated treatment well Patient left: in chair;with call bell/phone within reach;with chair alarm set  OT Visit Diagnosis: Unsteadiness on feet (R26.81);Other abnormalities of gait and mobility (R26.89);Muscle weakness (generalized) (M62.81)                Time: 5573-2202 OT Time Calculation (min): 28 min Charges:  OT General Charges $OT Visit: 1 Visit OT Evaluation $OT Eval Low Complexity: 1 Low OT Treatments $Self Care/Home Management : 8-22 mins  Lynnda Child, OTD, OTR/L Acute Rehab [phone]) [phone] - [place] 07/15/2021, 4:59 PM

## 2021-07-15 NOTE — Progress Notes (Signed)
°   07/15/21 1540  Clinical Encounter Type  Visited With Patient  Visit Type Initial;Spiritual support  Referral From Nurse  Consult/Referral To None   Chaplain responded to a spiritual consult for prayer. Patient presented in good spirits sharing her experience and wishes. We prayed for her healing and strength and wishes. If schedule permits I will stop by tomorrow as well.   [place] Resident  [facility] [phone]

## 2021-07-15 NOTE — Progress Notes (Signed)
PT Cancellation Note  Patient Details Name: [name] MRN: [id number] DOB: 1931-11-17   Cancelled Treatment:    Reason Eval/Treat Not Completed: Medical issues which prohibited therapy.  HR up to 162, fluctuating but quite high.  Follow up at another time.   Ramond Dial 07/15/2021, 4:19 PM  Mee Hives, PT PhD Acute Rehab Dept. Number: [place] and [place]

## 2021-07-15 NOTE — Progress Notes (Signed)
PT Cancellation Note  Patient Details Name: [name] MRN: [id number] DOB: 10-28-1931   Cancelled Treatment:    Reason Eval/Treat Not Completed: Patient at procedure or test/unavailable.  In echo, will return as time and pt allow.   Ramond Dial 07/15/2021, 11:29 AM  Mee Hives, PT PhD Acute Rehab Dept. Number: [place] and [place]

## 2021-07-16 ENCOUNTER — Telehealth: Payer: Self-pay | Admitting: Cardiovascular Disease

## 2021-07-16 MED ORDER — ADULT MULTIVITAMIN W/MINERALS CH: 1.0000 | ORAL_TABLET | Freq: Every day | ORAL | 0 refills | Status: AC

## 2021-07-16 MED ORDER — ENSURE ENLIVE PO LIQD: 237.0000 mL | Freq: Three times a day (TID) | ORAL | 0 refills | Status: DC

## 2021-07-16 MED ORDER — POLYETHYLENE GLYCOL 3350 17 G PO PACK: 17.0000 g | PACK | Freq: Every day | ORAL | 0 refills | Status: DC | PRN

## 2021-07-16 MED ORDER — PANTOPRAZOLE SODIUM 40 MG PO TBEC: 80.0000 mg | DELAYED_RELEASE_TABLET | Freq: Every day | ORAL | 0 refills | Status: DC

## 2021-07-16 MED ORDER — HYDROCODONE-ACETAMINOPHEN 5-325 MG PO TABS: 1.0000 | ORAL_TABLET | Freq: Two times a day (BID) | ORAL | 0 refills | Status: DC | PRN

## 2021-07-16 NOTE — Telephone Encounter (Signed)
Returned call to pt on home and cell number. No answer. Left msg to call back.

## 2021-07-16 NOTE — PMR Pre-admission (Signed)
PMR Admission Coordinator Pre-Admission Assessment  Patient: [name] is an 88 y.o., female MRN: [id number] DOB: 1931/07/19 Height: _0  (149.9 cm) Weight: 70 kg  Insurance Information HMO:     PPO:      PCP:      IPA:      80/20:      OTHER:  PRIMARY: Medicare a and b      Policy#: [id number]      Subscriber: pt Benefits:  Phone #: passport one source online     Name: 1/26 Eff. Date: a 10/20/1998 and b 12/20/1999     Deduct: $1600      Out of Pocket Max: none      Life Max: none CIR: 100%      SNF: 20 full days Outpatient: 80%     Co-Pay: 20% Home Health: 100%      Co-Pay: none DME: 80%     Co-Pay: 205 Providers: pt choice  SECONDARY: [place] american medicare supplement      Policy#: [id number]  Financial Counselor:       Phone#:   The Data Collection Information Summary for patients in Inpatient Rehabilitation Facilities with attached Privacy Act [place] Care Records was provided and verbally reviewed with: Patient and Family  Emergency Contact Information Contact Information     Name Relation Home Work Mobile   [place] Son [phone]  [phone]      Current Medical History  Patient Admitting Diagnosis: Fall, debility  History of Present Illness:  88 year old right-handed female with history of hypertension, hyperlipidemia, SSS status post PPM 2009 with generator change out 2018 maintained on aspirin, CKD stage III.  Presented 07/13/2021 with bilateral lower extremity edema and right hip pain.  Patient reportedly had a steroid injection into her back approxi-1 week ago with progressive worsening right lower extremity pain.  She denies any recent trauma however she did report when she was assisted onto the table for recent injection she felt the move was somewhat sudden and she felt like she hit the hard table too hard at that time..  X-ray right pelvis/hip showed step-off deformity of the right superior pubic ramus concerning for fracture.  CT of the right  hip showed right superior and inferior pubic ramus fracture.  Orthopedic service follow-up Dr. Alvan Dame given the proximity of the fractures to her acetabulum advised partial weightbearing 50% right lower extremity and no surgical intervention.  Admission chemistry sodium 134, BUN 66, creatinine 1.71, WBC 12,200 troponin 12-19, urinalysis negative nitrite, BNP 23.5.    Echocardiogram with ejection fraction of 60 to 65% no wall motion abnormalities grade 1 diastolic dysfunction.  In regards to patient's AKI on CKD stage III ultrasound suggested possible obstruction with large volume of urine in the bladder however right after the procedure she had a 1 L urinary output and several hours later bladder ultrasound showed 470 mL and immediately afterwards she voided 500 mL.  No current concern for true obstruction and she did receive IV diuresis latest creatinine improved 1.30..  Maintained on Lovenox for DVT prophylaxis.    Patient's medical record from [facility] has been reviewed by the rehabilitation admission coordinator and physician.  Past Medical History  Past Medical History:  Diagnosis Date   Allergic rhinitis    Anginal pain (HCC)    Asthmatic bronchitis    Chronic cough    Dysrhythmia    PAF   Esophageal reflux    GERD (gastroesophageal reflux disease)    History of stress test  11/20/2009   This was essentially normal,post-stress EF was hyperdynamic at 73 beats per mins, There was no scar or ischemia.   Hx of echocardiogram 11/05/2010   EF>55% showed a normal systolic function with impaired diastolic dysfuntion and also tissue doppler suggesting increased elevated left atrial pressure, she had moderate LA dilatation and mild LA dilatation. There was calcified mitral apparatus with moderate MR, mild to moderate TR and trace aortic insufficiency.   Hyperlipemia    Hypertension    Hypothyroidism    PAF (paroxysmal atrial fibrillation) ([place])    Pneumonia    Has the patient had major  surgery during 100 days prior to admission? No  Family History   family history includes Cancer in her father; Stroke in her mother and sister.  Current Medications  Current Facility-Administered Medications:    aspirin chewable tablet 81 mg, 81 mg, Oral, QHS, Gardner, Jared M, DO, 81 mg at 07/16/21 2130   diltiazem (CARDIZEM CD) 24 hr capsule 180 mg, 180 mg, Oral, QHS, Alcario Drought, Jared M, DO, 180 mg at 07/16/21 2130   enoxaparin (LOVENOX) injection 30 mg, 30 mg, Subcutaneous, Q24H, Alcario Drought, Jared M, DO, 30 mg at 07/16/21 2131   feeding supplement (ENSURE ENLIVE / ENSURE PLUS) liquid 237 mL, 237 mL, Oral, TID BM, Caren Griffins, MD, 237 mL at 07/17/21 8341   HYDROcodone-acetaminophen (NORCO/VICODIN) 5-325 MG per tablet 1-2 tablet, 1-2 tablet, Oral, Q6H PRN, Etta Quill, DO, 2 tablet at 07/15/21 2131   LORazepam (ATIVAN) tablet 0.5 mg, 0.5 mg, Oral, Daily PRN, Etta Quill, DO, 0.5 mg at 07/15/21 1326   metoprolol succinate (TOPROL-XL) 24 hr tablet 25 mg, 25 mg, Oral, QHS, Alcario Drought, Jared M, DO, 25 mg at 07/16/21 2130   metoprolol tartrate (LOPRESSOR) injection 2.5 mg, 2.5 mg, Intravenous, Q6H PRN, [place] Crane, Carole N, DO   morphine 2 MG/ML injection 0.5 mg, 0.5 mg, Intravenous, Q2H PRN, Alcario Drought, Jared M, DO   multivitamin with minerals tablet 1 tablet, 1 tablet, Oral, Daily, Caren Griffins, MD, 1 tablet at 07/17/21 9622   pantoprazole (PROTONIX) EC tablet 80 mg, 80 mg, Oral, BID, Jennette Kettle M, DO, 80 mg at 07/17/21 2979   pravastatin (PRAVACHOL) tablet 40 mg, 40 mg, Oral, QHS, Gardner, Jared M, DO, 40 mg at 07/16/21 2130  Patients Current Diet:  Diet Order             Diet Heart Room service appropriate? Yes; Fluid consistency: Thin  Diet effective now                  Precautions / Restrictions Precautions Precautions: Fall Precaution Comments: chair and bed alarm Restrictions Weight Bearing Restrictions: Yes RLE Weight Bearing: Partial weight bearing RLE Partial  Weight Bearing Percentage or Pounds: 50   Has the patient had 2 or more falls or a fall with injury in the past year? Yes  Prior Activity Level Limited Community (1-2x/wk): decline in funciton over past 6 months using RW and w/c  Prior Functional Level Self Care: Did the patient need help bathing, dressing, using the toilet or eating? Needed some help  Indoor Mobility: Did the patient need assistance with walking from room to room (with or without device)? Needed some help  Stairs: Did the patient need assistance with internal or external stairs (with or without device)? Needed some help  Functional Cognition: Did the patient need help planning regular tasks such as shopping or remembering to take medications? Needed some help  Patient Information Are you  of Hispanic, Latino/a,or Spanish origin?: A. No, not of Hispanic, Latino/a, or Spanish origin What is your race?: A. White Do you need or want an interpreter to communicate with a doctor or health care staff?: 0. No  Patient's Response To:  Health Literacy and Transportation Is the patient able to respond to health literacy and transportation needs?: Yes Health Literacy - How often do you need to have someone help you when you read instructions, pamphlets, or other written material from your doctor or pharmacy?: Never In the past 12 months, has lack of transportation kept you from medical appointments or from getting medications?: No In the past 12 months, has lack of transportation kept you from meetings, work, or from getting things needed for daily living?: No  Development worker, international aid / [place] Devices/Equipment: Wellsite geologist, Radio producer (specify quad or straight), Bedside commode/3-in-1, Wheelchair, Environmental consultant (specify type) Home Equipment: Conservation officer, nature (2 wheels), BSC/3in1, Wheelchair - manual, Chartered certified accountant  Prior Device Use: Indicate devices/aids used by the patient prior to current illness, exacerbation or injury?  Manual wheelchair and Walker  Current Functional Level Cognition  Overall Cognitive Status: No family/caregiver present to determine baseline cognitive functioning Following Commands: Follows one step commands with increased time Safety/Judgement: Decreased awareness of deficits, Decreased awareness of safety General Comments: Pt is able to follow instructions but is weak and requires more details    Extremity Assessment (includes Sensation/Coordination)  Upper Extremity Assessment: Generalized weakness  Lower Extremity Assessment: Defer to PT evaluation RLE Deficits / Details: distal edema bilat R>L and more tender on R, able to perform quad set and limited ROM hip flexion/knee flexion given pain RLE: Unable to fully assess due to pain    ADLs  Overall ADL's : Needs assistance/impaired Eating/Feeding: Set up, Sitting Grooming: Wash/dry hands, Wash/dry face, Brushing hair, Sitting Upper Body Bathing: Minimal assistance, Sitting Lower Body Bathing: Moderate assistance, Sitting/lateral leans Upper Body Dressing : Minimal assistance, Sitting Upper Body Dressing Details (indicate cue type and reason): dons fresh gown sitting EOB Lower Body Dressing: Total assistance, Sitting/lateral leans Lower Body Dressing Details (indicate cue type and reason): to adjust socks Toilet Transfer: Minimal assistance, Stand-pivot, BSC/3in1, Rolling walker (2 wheels) Toilet Transfer Details (indicate cue type and reason): simulated to recliner Toileting- Clothing Manipulation and Hygiene: Maximal assistance, Sit to/from stand Toileting - Clothing Manipulation Details (indicate cue type and reason): max A to manage clothing prior to sitting Functional mobility during ADLs: Minimal assistance    Mobility  Overal bed mobility: Modified Independent Bed Mobility: Supine to Sit Supine to sit: Min assist General bed mobility comments: in chair    Transfers  Overall transfer level: Needs assistance Equipment  used: Rolling walker (2 wheels), 1 person hand held assist Transfers: Sit to/from Stand Sit to Stand: Min assist Bed to/from chair/wheelchair/BSC transfer type:: Stand pivot Stand pivot transfers: Min assist General transfer comment: no automatic use of UE's, requires cues to stand and sit    Ambulation / Gait / Stairs / Wheelchair Mobility  Ambulation/Gait Ambulation/Gait assistance: Herbalist (Feet): 6 Feet Assistive device: Rolling walker (2 wheels), 1 person hand held assist Gait Pattern/deviations: Step-to pattern, Step-through pattern, Decreased stride length, Decreased weight shift to right General Gait Details: NT Gait velocity: reduced    Posture / Balance Dynamic Sitting Balance Sitting balance - Comments: sits unsupported EOB Balance Overall balance assessment: Needs assistance, History of Falls Sitting-balance support: Feet supported Sitting balance-Leahy Scale: Good Sitting balance - Comments: sits unsupported EOB Standing balance support:  Bilateral upper extremity supported Standing balance-Leahy Scale: Poor    Special needs/care consideration Fall precautions   Previous Home Environment  Living Arrangements:  (lives with son, Shanon Brow)  Lives With: Son Available Help at Discharge: Family, Available 24 hours/day (son works in Press photographer during the day, daughter Alyse Low to come form [place] after discharge for 2 weeks to assist) Type of Home: House Home Layout: One level Home Access: Stairs to enter Entrance Stairs-Rails: Right, Left Entrance Stairs-Number of Steps: 5 Bathroom Shower/Tub: Multimedia programmer: Standard Bathroom Accessibility: Yes How Accessible: Accessible via [place]: No Additional Comments: states her walker does not have wheels  Discharge Living Setting Plans for Discharge Living Setting: Patient's home, Lives with (comment) (son, Shanon Brow) Type of Home at Discharge: House Discharge Home Layout: One  level Discharge Home Access: Stairs to enter Entrance Stairs-Rails: Right, Left Entrance Stairs-Number of Steps: 5 Discharge Bathroom Shower/Tub: Walk-in shower Discharge Bathroom Toilet: Standard Discharge Bathroom Accessibility: Yes How Accessible: Accessible via walker Does the patient have any problems obtaining your medications?: No  Social/Family/Support Systems Patient Roles: Parent Contact Information: son, Shanon Brow Anticipated Caregiver: son, Shanon Brow and daughter, Alyse Low Anticipated Ambulance person Information: see contacts Ability/Limitations of Caregiver: Shanon Brow works days, Alyse Low to come from [place] ar discharge to stay for 2 weeks and work remote Caregiver Availability: 24/7 Discharge Plan Discussed with Primary Caregiver: Yes Is Caregiver In Agreement with Plan?: Yes Does Caregiver/Family have Issues with Lodging/Transportation while Pt is in Rehab?: No  Goals Patient/Family Goal for Rehab: supervision PT, supervision to min asisst OT Expected length of stay: ELOS 10 to 14 days Pt/Family Agrees to Admission and willing to participate: Yes Program Orientation Provided & Reviewed with Pt/Caregiver Including Roles  & Responsibilities: Yes  Decrease burden of Care through IP rehab admission: n/a  Possible need for SNF placement upon discharge: not anticipated  Patient Condition: I have reviewed [facility] records [facility], spoken with CM, and patient and son. I met with patient at the bedside for inpatient rehabilitation assessment.  Patient will benefit from ongoing PT and OT, can actively participate in 3 hours of therapy a day 5 days of the week, and can make measurable gains during the admission.  Patient will also benefit from the coordinated team approach during an Inpatient Acute Rehabilitation admission.  The patient will receive intensive therapy as well as Rehabilitation physician, nursing, social worker, and care management interventions.  Due to  bladder management, bowel management, safety, skin/wound care, disease management, medication administration, pain management, and patient education the patient requires 24 hour a day rehabilitation nursing.  The patient is currently min to mod assist with mobility and basic ADLs.  Discharge setting and therapy post discharge at home with home health is anticipated.  Patient has agreed to participate in the Acute Inpatient Rehabilitation Program and will admit today.  Preadmission Screen Completed By:  Cleatrice Burke, 07/17/2021 10:37 AM ______________________________________________________________________   Discussed status with Dr. Naaman Plummer on 07/17/2021 at 1037 and received approval for admission today.  Admission Coordinator:  Cleatrice Burke, RN, time  7681 Date  07/17/2021   Assessment/Plan: Diagnosis: pelvic fxs , debility Does the need for close, 24 hr/day Medical supervision in concert with the patient's rehab needs make it unreasonable for this patient to be served in a less intensive setting? Yes Co-Morbidities requiring supervision/potential complications: SSS, gait disorder, ckd iii, chronic low back pain Due to bladder management, bowel management, safety, skin/wound care, disease management, medication administration, pain management,  and patient education, does the patient require 24 hr/day rehab nursing? Yes Does the patient require coordinated care of a physician, rehab nurse, PT, OT  to address physical and functional deficits in the context of the above medical diagnosis(es)? Yes Addressing deficits in the following areas: balance, endurance, locomotion, strength, transferring, bowel/bladder control, bathing, dressing, feeding, grooming, toileting, and psychosocial support Can the patient actively participate in an intensive therapy program of at least 3 hrs of therapy 5 days a week? Yes The potential for patient to make measurable gains while on inpatient rehab is  excellent Anticipated functional outcomes upon discharge from inpatient rehab: supervision PT, supervision and min assist OT, n/a SLP Estimated rehab length of stay to reach the above functional goals is: 10-14 days Anticipated discharge destination: Home 10. Overall Rehab/Functional Prognosis: excellent   MD Signature: Meredith Staggers, MD, [place] Director Rehabilitation Services 07/17/2021

## 2021-07-16 NOTE — Progress Notes (Signed)
Physical Therapy Treatment Patient Details Name: [name] MRN: [id number] DOB: 1931/10/18 Today's Date: 07/16/2021   History of Present Illness 88 yo female [facility] with R hip and BLE swelling since steroid injection ~1/16. Imaging shows R superior and inferior rami fractures, PWB 50% RLE. Pt also with AKI, PMH includes HTN, HLD, afib, sick sinus syndrome and complete heart block status post pacemaker placement, CKD 3.    PT Comments    Pt was seen for progressing her mobility today, up in chair and agreed to try a few steps.  With close guard of chair she is making a lot of progress given her pain level and tolerance to use UE's to manage wb.  Follow along with her and just have received approval for CIR, which is still her recommended care level.  Pt is looking forward to getting home and excited about going to rehab.   Focus on standing and wb management, safety of gait.  HR is more controlled today, down to 72 initially.  No elevations occurred with mobility on HR.   Recommendations for follow up therapy are one component of a multi-disciplinary discharge planning process, led by the attending physician.  Recommendations may be updated based on patient status, additional functional criteria and insurance authorization.  Follow Up Recommendations  Acute inpatient rehab (3hours/day)     Assistance Recommended at Discharge Frequent or constant Supervision/Assistance  Patient can return home with the following A lot of help with walking and/or transfers;A lot of help with bathing/dressing/bathroom;Assistance with cooking/housework;Assist for transportation;Help with stairs or ramp for entrance   Equipment Recommendations  None recommended by PT    Recommendations for Other Services Rehab consult     Precautions / Restrictions Precautions Precautions: Fall Precaution Comments: chair and bed alarm Restrictions Weight Bearing Restrictions: Yes RLE Weight Bearing:  Partial weight bearing RLE Partial Weight Bearing Percentage or Pounds: 50%     Mobility  Bed Mobility               General bed mobility comments: in chair    Transfers Overall transfer level: Needs assistance Equipment used: Rolling walker (2 wheels), 1 person hand held assist Transfers: Sit to/from Stand Sit to Stand: Min assist           General transfer comment: no automatic use of UE's, requires cues to stand and sit    Ambulation/Gait Ambulation/Gait assistance: Min assist Gait Distance (Feet): 6 Feet Assistive device: Rolling walker (2 wheels), 1 person hand held assist Gait Pattern/deviations: Step-to pattern, Step-through pattern, Decreased stride length, Decreased weight shift to right Gait velocity: reduced         Stairs             Wheelchair Mobility    Modified Rankin (Stroke Patients Only)       Balance Overall balance assessment: Needs assistance, History of Falls Sitting-balance support: Feet supported Sitting balance-Leahy Scale: Good     Standing balance support: Bilateral upper extremity supported Standing balance-Leahy Scale: Poor                              Cognition Arousal/Alertness: Awake/alert Behavior During Therapy: WFL for tasks assessed/performed, Anxious Overall Cognitive Status: No family/caregiver present to determine baseline cognitive functioning Area of Impairment: Problem solving, Awareness, Safety/judgement                     Memory: Decreased short-term memory,  Decreased recall of precautions Following Commands: Follows one step commands with increased time Safety/Judgement: Decreased awareness of deficits, Decreased awareness of safety   Problem Solving: Decreased initiation, Requires verbal cues, Requires tactile cues General Comments: Pt is able to follow instructions but is weak and requires more details        Exercises      General Comments General comments (skin  integrity, edema, etc.): pt is requiring repetitive cues but able to follow instructions with extra time      Pertinent Vitals/Pain Pain Assessment Pain Assessment: Faces Faces Pain Scale: Hurts little more Pain Location: R hip, groin Pain Descriptors / Indicators: Guarding, Aching Pain Intervention(s): Limited activity within patient's tolerance, Monitored during session, Premedicated before session, Repositioned    Home Living                          Prior Function            PT Goals (current goals can now be found in the care plan section) Acute Rehab PT Goals Patient Stated Goal: home Progress towards PT goals: Progressing toward goals    Frequency    Min 3X/week      PT Plan Current plan remains appropriate    Co-evaluation              AM-PAC PT "6 Clicks" Mobility   Outcome Measure  Help needed turning from your back to your side while in a flat bed without using bedrails?: A Little Help needed moving from lying on your back to sitting on the side of a flat bed without using bedrails?: A Lot Help needed moving to and from a bed to a chair (including a wheelchair)?: A Lot Help needed standing up from a chair using your arms (e.g., wheelchair or bedside chair)?: A Little Help needed to walk in hospital room?: A Lot Help needed climbing 3-5 steps with a railing? : Total 6 Click Score: 13    End of Session Equipment Utilized During Treatment: Gait belt Activity Tolerance: Patient tolerated treatment well;Patient limited by pain Patient left: in chair;with call bell/phone within reach;with chair alarm set Nurse Communication: Mobility status PT Visit Diagnosis: Other abnormalities of gait and mobility (R26.89);Muscle weakness (generalized) (M62.81);Pain;History of falling (Z91.81) Pain - Right/Left: Right Pain - part of body: Hip     Time: 6712-4580 PT Time Calculation (min) (ACUTE ONLY): 25 min  Charges:  $Gait Training: 8-22  mins $Therapeutic Activity: 8-22 mins            Ramond Dial 07/16/2021, 12:46 PM  Mee Hives, PT PhD Acute Rehab Dept. Number: [place] and [place]

## 2021-07-16 NOTE — Plan of Care (Signed)

## 2021-07-16 NOTE — Discharge Summary (Addendum)
Discharge Summary  [name] JAS:[id number] DOB: 04/12/1932  PCP: Leamon Arnt, MD  Admit date: 07/13/2021 Discharge date: 07/16/2021  Time spent: 35 minutes.  Recommendations for Outpatient Follow-up:  Follow-up with orthopedic surgery in 3 to 4 weeks Follow-up with your primary care provider Continue PT OT with assistance and fall precautions.  Recommendations per orthopedic surgery Dr. Alvan Dame: "Given the proximity of the fractures to her acetabulum laterally I would have her be PWB (50%) on the RLE as best as possible until follow up radiographs indicate some healing PT consult for above in hospital   RTC in 3-4 weeks "  Discharge Diagnoses:  Active Hospital Problems   Diagnosis Date Noted   AKI (acute kidney injury) on CKD 3a ([place]) 07/13/2021   Hyponatremia 07/14/2021   Normocytic anemia 07/14/2021   Hypokalemia 07/14/2021   Fracture of right superior pubic ramus, closed, initial encounter ([place]) 07/13/2021   Stage 3a chronic kidney disease (CKD) ([place]) 04/27/2021   Lower extremity edema 11/22/2014   Paroxysmal atrial fibrillation ([place]) 05/17/2013   CHB (complete heart block) ([place]) s/p pacemaker 05/17/2013   Essential hypertension 02/04/2013   Mixed hyperlipidemia 02/04/2013   Pacemaker 02/04/2013   GERD (gastroesophageal reflux disease) 10/26/2007    Resolved Hospital Problems  No resolved problems to display.    Discharge Condition: Stable  Diet recommendation: Resume previous diet  Vitals:   07/16/21 0422 07/16/21 0714  BP: 112/62 115/87  Pulse: 75 71  Resp: 20   Temp: 98 F (36.7 C) 98.1 F (36.7 C)  SpO2: 97% 98%    History of present illness:  88 year old female with history of HTN, HLD, sick sinus syndrome with history of pacemaker, comes into the ED with bilateral lower extremity edema as well as right hip pain.  She has been having chronic back pain for a number of weeks, had a steroid injection in her back and felt like when she was assisted  onto the hard table movements were a little bit sudden.  Following that injection, has been experiencing severe right hip pain and difficulties ambulating.  Work-up revealed right superior and inferior rami fractures.  Seen by orthopedic surgery Dr. Alvan Dame, recommended partial weightbearing, 50% on the right lower extremity as best as possible until follow-up radiographs indicate some healing.  Return to clinic in 3 to 4 weeks.   Patient was evaluated by PT OT recommendation for inpatient rehab.  07/16/2021: Patient was seen at bedside.  There were no acute events overnight.  She has no new complaints.  Her pain is well controlled on current pain management.  She wants her son to make a decision to whether or not she can go to inpatient rehab.  Hospital Course:  Principal Problem:   AKI (acute kidney injury) on CKD 3a ([place]) Active Problems:   GERD (gastroesophageal reflux disease)   Pacemaker   Essential hypertension   Mixed hyperlipidemia   CHB (complete heart block) ([facility]) s/p pacemaker   Paroxysmal atrial fibrillation ([facility])   Lower extremity edema   Stage 3a chronic kidney disease (CKD) ([facility])   Fracture of right superior pubic ramus, closed, initial encounter ([place])   Hyponatremia   Normocytic anemia   Hypokalemia  Improved AKI (acute kidney injury) on CKD 3B ([place])- (present on admission) suspect prerenal in the setting of dehydration from poor oral intake in combination with home valsartan and HCTZ Baseline creatinine appears to be 1.1 with GFR 45. Presented with creatinine of 1.71 with GFR of 29. Last creatinine 1.30 with  GFR of 40 Continue to avoid nephrotoxic agents, dehydration and hypotension. Continue to hold off ARB and HCTZ. Follow-up with your primary care provider within a week.  Resolved post repletion: Hypokalemia Serum potassium 4.3.  Serum magnesium 1.9.   Normocytic anemia -No overt bleeding Hemoglobin 10.0 with MCV of 93. Follow-up with your PCP within a week.    Hyponatremia - Mild, on admission, in the setting of hypervolemia.   Sodium normalized with IV Lasix.   Lasix is currently on hold  Serum sodium 132 from 137.   Asymptomatic. Follow-up with your PCP and repeat BMP in 1 week.   Fracture of right superior pubic ramus, closed, initial encounter ([place])- (present on admission) -CT scan of the pelvis showed right superior and inferior pubic ramus fractures.  Seen by orthopedic surgery Dr. Alvan Dame.  Plan for nonsurgical management at this time. Follow-up in the office in 3 to 4 weeks. Analgesics as needed along with bowel regimen as needed.   Improved bilateral lower extremity edema- (present on admission) - This appears to be acute on chronic.   Improved after IV Lasix.   2D echo done on 07/15/2018 showed normal LVEF 60 to 65% and grade 1 diastolic dysfunction.  Paroxysmal atrial fibrillation ([place])- (present on admission) -Per chart,"Very brief episodes were recorded by her pacemaker in 2012 (less than 2 minutes). She is on aspirin alone for embolism prophylaxis". Continue cardizem and metoprolol. Follow-up with your cardiologist.   CHB (complete heart block) [facility]) s/p pacemaker- (present on admission) Follow-up with your cardiologist.   Mixed hyperlipidemia- (present on admission) - Continue home statin   Essential hypertension- (present on admission) -Hold Diovan due to AKI.   Continue home diltiazem, metoprolol   GERD (gastroesophageal reflux disease)- (present on admission) -on home protonix   Fracture of right superior pubic ramus, closed, initial encounter ([place])- (present on admission), suspect pathological fracture. Pain is well controlled PT recommending inpatient rehab Continue PT OT with assistance and fall precautions with the guidance of orthopedic surgery. She was seen by orthopedic surgery who recommended 50% weightbearing in right lower extremity. Continue analgesics as needed and bowel regimen as needed.   Scheduled  Meds:  aspirin  81 mg Oral QHS   diltiazem  180 mg Oral QHS   enoxaparin (LOVENOX) injection  30 mg Subcutaneous Q24H   feeding supplement  237 mL Oral TID BM   metoprolol succinate  25 mg Oral QHS   multivitamin with minerals  1 tablet Oral Daily   pantoprazole  80 mg Oral BID   pravastatin  40 mg Oral QHS    Continuous Infusions: PRN Meds:.HYDROcodone-acetaminophen, LORazepam, morphine injection   Diet Orders (From admission, onward)        Start     Ordered    07/13/21 1944   Diet Heart Room service appropriate? Yes; Fluid consistency: Thin  Diet effective now       Question Answer Comment  Room service appropriate? Yes    Fluid consistency: Thin       07/13/21 1944                  DVT prophylaxis: enoxaparin (LOVENOX) injection 30 mg Start: 07/13/21 1945     Recent Labs       Lab Results  Component Value Date    PLT 251 07/15/2021          Code Status: Full Code      Consultants:  Orthopedic surgery CIR   Procedures:  2D echo  Microbiology  none   Antimicrobials: none    Consultations: Orthopedic surgery  Discharge Exam: BP 115/87 (BP Location: Left Arm)    Pulse 71    Temp 98.1 F (36.7 C) (Oral)    Resp 20    Ht 4\' 11"  (1.499 m)    Wt 70 kg    SpO2 98%    BMI 31.17 kg/m  General: 88 y.o. year-old female well developed well nourished in no acute distress.  Alert and oriented x3. Cardiovascular: Regular rate and rhythm with no rubs or gallops.  No thyromegaly or JVD noted.   Respiratory: Clear to auscultation with no wheezes or rales. Good inspiratory effort. Abdomen: Soft nontender nondistended with normal bowel sounds x4 quadrants. Musculoskeletal: No lower extremity edema. 2/4 pulses in all 4 extremities. Skin: No ulcerative lesions noted or rashes, Psychiatry: Mood is appropriate for condition and setting  Discharge Instructions You were cared for by a hospitalist during your hospital stay. If you have any questions about your  discharge medications or the care you received while you were in the hospital after you are discharged, you can call the unit and asked to speak with the hospitalist on call if the hospitalist that took care of you is not available. Once you are discharged, your primary care physician will handle any further medical issues. Please note that NO REFILLS for any discharge medications will be authorized once you are discharged, as it is imperative that you return to your primary care physician (or establish a relationship with a primary care physician if you do not have one) for your aftercare needs so that they can reassess your need for medications and monitor your lab values.   Allergies as of 07/16/2021       Reactions   Fluticasone-salmeterol Other (See Comments)   unknown Other reaction(s): tongue/throat symptoms   Sulfa Antibiotics    Other reaction(s): gastric intol   Sulfonamide Derivatives Other (See Comments)   unknown   Benzonatate Other (See Comments)   tongue/throat symptoms in the past but tolerated more recently Other reaction(s): and tongue/throat symptoms in the past (not more recently)   Erythromycin Nausea And Vomiting, Other (See Comments)   Stomach cramps Other reaction(s): gastric intol   Meloxicam Other (See Comments)   Mild dyspnea Other reaction(s): dyspnea   Sulfamethoxazole Other (See Comments)   Gastric intolerance        Medication List     STOP taking these medications    ibuprofen 200 MG tablet Commonly known as: ADVIL   valsartan-hydrochlorothiazide 160-25 MG tablet Commonly known as: DIOVAN-HCT       TAKE these medications    acetaminophen 500 MG tablet Commonly known as: TYLENOL Take 1,000 mg by mouth every 6 (six) hours as needed for moderate pain or mild pain.   aspirin 81 MG chewable tablet Chew 81 mg by mouth at bedtime.   calcium carbonate 750 MG chewable tablet Commonly known as: TUMS EX Chew 1 tablet by mouth daily as needed  for heartburn.   cetirizine 10 MG tablet Commonly known as: ZYRTEC 1 tablet   cholecalciferol 25 MCG (1000 UNIT) tablet Commonly known as: VITAMIN D3 Take 1,000 Units by mouth daily.   diltiazem 180 MG 24 hr capsule Commonly known as: CARDIZEM CD Take 1 capsule (180 mg total) by mouth at bedtime.   feeding supplement Liqd Take 237 mLs by mouth 3 (three) times daily between meals for 7 days.   furosemide 40 MG tablet Commonly known as:  LASIX Take 1 tablet (40 mg total) by mouth daily.   HYDROcodone-acetaminophen 5-325 MG tablet Commonly known as: NORCO/VICODIN Take 1 tablet by mouth 2 (two) times daily as needed for up to 5 days for severe pain.   lidocaine 5 % Commonly known as: LIDODERM Lidoderm 5 % topical patch  APPLY 1 PATCH BY TOPICAL ROUTE ONCE DAILY (MAY WEAR UP TO 12HOURS.)   LORazepam 0.5 MG tablet Commonly known as: ATIVAN Take 1 tablet (0.5 mg total) by mouth daily as needed for anxiety.   metoprolol succinate 25 MG 24 hr tablet Commonly known as: TOPROL-XL TAKE 1 TABLET (25 MG TOTAL) BY MOUTH DAILY. What changed: when to take this   multivitamin with minerals Tabs tablet Take 1 tablet by mouth daily. Start taking on: July 17, 2021   pantoprazole 40 MG tablet Commonly known as: PROTONIX Take 2 tablets (80 mg total) by mouth daily. What changed: when to take this   polyethylene glycol 17 g packet Commonly known as: MiraLax Take 17 g by mouth daily as needed for mild constipation.   potassium chloride SA 20 MEQ tablet Commonly known as: Klor-Con M20 Take 1 tablet (20 mEq total) by mouth daily.   pravastatin 40 MG tablet Commonly known as: PRAVACHOL Take 40 mg by mouth at bedtime.   vitamin C 100 MG tablet 500 mg daily.       Allergies  Allergen Reactions   Fluticasone-Salmeterol Other (See Comments)    unknown Other reaction(s): tongue/throat symptoms   Sulfa Antibiotics     Other reaction(s): gastric intol   Sulfonamide Derivatives  Other (See Comments)    unknown   Benzonatate Other (See Comments)    tongue/throat symptoms in the past but tolerated more recently Other reaction(s): and tongue/throat symptoms in the past (not more recently)   Erythromycin Nausea And Vomiting and Other (See Comments)    Stomach cramps Other reaction(s): gastric intol   Meloxicam Other (See Comments)    Mild dyspnea Other reaction(s): dyspnea   Sulfamethoxazole Other (See Comments)    Gastric intolerance    Follow-up Information     Leamon Arnt, MD. Call today.   Specialty: Family Medicine Why: Please call for a posthospital follow-up appointment. Contact information: [place] [address] [phone]         Troy Sine, MD .   Specialty: Cardiology Contact information: [address] [place] [address] [phone]         Paralee Cancel, MD. Call today.   Specialty: Orthopedic Surgery Why: Please call for a post hospital follow-up appointment. Contact information: [address] [place] [address] [phone]                  The results of significant diagnostics from this hospitalization (including imaging, microbiology, ancillary and laboratory) are listed below for reference.    Significant Diagnostic Studies: DG Chest 2 View  Result Date: 07/13/2021 CLINICAL DATA:  88 year old female with right leg pain. Right hip pain. EXAM: CHEST - 2 VIEW COMPARISON:  Chest radiographs 08/05/2016 and earlier. FINDINGS: Upright AP and lateral views of the chest. Right chest dual lead cardiac pacemaker is chronic. Stable cardiac size, borderline to mildly enlarged. Other mediastinal contours are within normal limits. Visualized tracheal air column is within normal limits. Lung volumes are stable and normal. Both lungs appear clear. No pneumothorax or pleural effusion. Abdominal Calcified aortic atherosclerosis. Stable cholecystectomy clips.  Osteopenia. No acute osseous abnormality identified.  IMPRESSION: No acute cardiopulmonary abnormality. Aortic Atherosclerosis (ICD10-I70.0). Electronically Signed   By: Genevie Ann M.D.   On: 07/13/2021 10:55   US RENAL  Result Date: 07/13/2021 CLINICAL DATA:  An 88 year old female presents for evaluation of acute kidney injury. EXAM: RENAL / URINARY TRACT ULTRASOUND COMPLETE COMPARISON:  None FINDINGS: Right Kidney: Renal measurements: 13.8 x 5.3 x 5.3 cm = volume: 204 mL. Increased echogenicity of renal cortex. Multiple renal cysts seen in the RIGHT kidney largest measuring 5.4 x 6.2 x 5.5 cm in the upper pole, next largest measuring 3.7 x 9.1 x 3.4 cm in the interpolar RIGHT kidney. No hydronephrosis. At least 2 additional renal cysts on the RIGHT. Left Kidney: Renal measurements: 11.4 x 6.1 x 7.1 cm = volume: 257 mL. Extending inferiorly from the LEFT kidney is a 9.0 x 7.3 x 8.5 cm renal cyst. In the upper pole of the LEFT kidney is a cyst measuring 3.7 x 3.5 x 3.1 cm. At least 3 additional small renal cysts to moderate-size renal cysts. Question mild LEFT collecting system dilation, difficult to assess due to body habitus and presence of renal cysts. Bladder: Moderate to marked distension of the urinary bladder with 569 cc bladder volume. Other: Mildly low nodular contour of the liver raising the question of liver disease. IMPRESSION: Moderate to marked distension of the urinary bladder, correlate with any signs of urinary retention. Question mild LEFT hydronephrosis, limited assessment in general due to patient body habitus. Renal cysts. Question of mildly nodular contour of the liver, correlate with any clinical or laboratory evidence of liver disease. Electronically Signed   By: Zetta Bills M.D.   On: 07/13/2021 20:29   CT Hip Right Wo Contrast  Result Date: 07/13/2021 CLINICAL DATA:  Right hip pain following a shooting pain in her back 1 week ago. Suspected right superior pubic ramus fracture on  radiographs earlier today. EXAM: CT OF THE RIGHT HIP WITHOUT CONTRAST TECHNIQUE: Multidetector CT imaging of the right hip was performed according to the standard protocol. Multiplanar CT image reconstructions were also generated. RADIATION DOSE REDUCTION: This exam was performed according to the departmental dose-optimization program which includes automated exposure control, adjustment of the mA and/or kV according to patient size and/or use of iterative reconstruction technique. COMPARISON:  Right hip radiographs obtained earlier today. FINDINGS: Bones/Joint/Cartilage Mildly displaced and mildly comminuted fracture of the lateral aspect of the right superior pubic ramus. Mildly comminuted, nondisplaced fracture of the right inferior pubic ramus. The anterior and posterior columns of the acetabulum are intact as is the quadrilateral plate. No hip fracture or dislocation. Ligaments Suboptimally assessed by CT. Muscles and Tendons Unremarkable. Soft tissues Distended urinary bladder. IMPRESSION: Right superior and inferior pubic ramus fractures, as described above. Electronically Signed   By: Claudie Revering M.D.   On: 07/13/2021 20:00   ECHOCARDIOGRAM COMPLETE  Result Date: 07/15/2021    ECHOCARDIOGRAM REPORT   Patient Name:   BRANAE CRAIL Date of Exam: 07/15/2021 Medical Rec #:  [id number]     Height:       59.0 in Accession #:    [id number]    Weight:       154.3 lb Date of Birth:  November 23, 1931      BSA:          1.652 m Patient Age:    27 years      BP:           133/44 mmHg Patient Gender: F  HR:           70 bpm. Exam Location:  Inpatient Procedure: 2D Echo Indications:    CHF  History:        Patient has prior history of Echocardiogram examinations, most                 recent 10/23/2014. Arrythmias:Atrial Fibrillation; Risk                 Factors:Hypertension.  Sonographer:    Jefferey Pica Referring Phys: [place]  1. Left ventricular ejection fraction, by estimation, is 60  to 65%. The left ventricle has normal function. The left ventricle has no regional wall motion abnormalities. There is mild left ventricular hypertrophy. Left ventricular diastolic parameters are consistent with Grade I diastolic dysfunction (impaired relaxation).  2. Right ventricular systolic function is normal. The right ventricular size is normal. The estimated right ventricular systolic pressure is 81.0 mmHg.  3. Left atrial size was mildly dilated.  4. The mitral valve is normal in structure. Trivial mitral valve regurgitation. No evidence of mitral stenosis.  5. The aortic valve is tricuspid. Aortic valve regurgitation is not visualized. No aortic stenosis is present.  6. The inferior vena cava is normal in size with greater than 50% respiratory variability, suggesting right atrial pressure of 3 mmHg. FINDINGS  Left Ventricle: Left ventricular ejection fraction, by estimation, is 60 to 65%. The left ventricle has normal function. The left ventricle has no regional wall motion abnormalities. The left ventricular internal cavity size was normal in size. There is  mild left ventricular hypertrophy. Left ventricular diastolic parameters are consistent with Grade I diastolic dysfunction (impaired relaxation). Right Ventricle: The right ventricular size is normal. No increase in right ventricular wall thickness. Right ventricular systolic function is normal. The tricuspid regurgitant velocity is 2.67 m/s, and with an assumed right atrial pressure of 3 mmHg, the estimated right ventricular systolic pressure is 17.5 mmHg. Left Atrium: Left atrial size was mildly dilated. Right Atrium: Right atrial size was normal in size. Pericardium: There is no evidence of pericardial effusion. Mitral Valve: The mitral valve is normal in structure. Trivial mitral valve regurgitation. No evidence of mitral valve stenosis. Tricuspid Valve: The tricuspid valve is normal in structure. Tricuspid valve regurgitation is trivial. Aortic  Valve: The aortic valve is tricuspid. Aortic valve regurgitation is not visualized. No aortic stenosis is present. Aortic valve peak gradient measures 9.7 mmHg. Pulmonic Valve: The pulmonic valve was normal in structure. Pulmonic valve regurgitation is not visualized. Aorta: The aortic root is normal in size and structure. Venous: The inferior vena cava is normal in size with greater than 50% respiratory variability, suggesting right atrial pressure of 3 mmHg. IAS/Shunts: No atrial level shunt detected by color flow Doppler. Additional Comments: A device lead is visualized in the right ventricle.  LEFT VENTRICLE PLAX 2D LVIDd:         4.40 cm   Diastology LVIDs:         3.00 cm   LV e' medial:    6.60 cm/s LV PW:         1.20 cm   LV E/e' medial:  9.5 LV IVS:        1.20 cm   LV e' lateral:   6.74 cm/s LVOT diam:     2.00 cm   LV E/e' lateral: 9.3 LV SV:         95 LV SV Index:   58 LVOT  Area:     3.14 cm  RIGHT VENTRICLE            IVC RV Basal diam:  2.70 cm    IVC diam: 1.50 cm RV S prime:     6.94 cm/s TAPSE (M-mode): 2.6 cm LEFT ATRIUM             Index        RIGHT ATRIUM           Index LA diam:        4.00 cm 2.42 cm/m   RA Area:     15.20 cm LA Vol (A2C):   49.2 ml 29.78 ml/m  RA Volume:   42.20 ml  25.55 ml/m LA Vol (A4C):   37.0 ml 22.40 ml/m LA Biplane Vol: 44.8 ml 27.12 ml/m  AORTIC VALVE                 PULMONIC VALVE AV Area (Vmax): 2.66 cm     PV Vmax:       1.18 m/s AV Vmax:        156.00 cm/s  PV Peak grad:  5.6 mmHg AV Peak Grad:   9.7 mmHg LVOT Vmax:      132.00 cm/s LVOT Vmean:     87.000 cm/s LVOT VTI:       0.303 m  AORTA Ao Root diam: 2.80 cm Ao Asc diam:  2.80 cm MITRAL VALVE                TRICUSPID VALVE MV Area (PHT): 3.28 cm     TR Peak grad:   28.5 mmHg MV Decel Time: 231 msec     TR Vmax:        267.00 cm/s MV E velocity: 63.00 cm/s MV A velocity: 107.00 cm/s  SHUNTS MV E/A ratio:  0.59         Systemic VTI:  0.30 m                             Systemic Diam: 2.00 cm Dalton  McleanMD Electronically signed by Franki Monte Signature Date/Time: 07/15/2021/5:00:33 PM    Final    DG Hip Unilat W or Wo Pelvis 2-3 Views Right  Result Date: 07/13/2021 CLINICAL DATA:  Hip pain EXAM: DG HIP (WITH OR WITHOUT PELVIS) 3V RIGHT COMPARISON:  None. FINDINGS: Stable deformity of the right superior pubic ramus. Mild degenerative changes of the bilateral hips, SI joints and pubic symphysis. Degenerative changes of the partially visualized lower lumbar spine. Soft tissues are unremarkable. IMPRESSION: Step-off deformity of the right superior pubic ramus, concerning for fracture. Recommend further evaluation with cross-sectional imaging. Electronically Signed   By: Yetta Glassman M.D.   On: 07/13/2021 17:08   VAS [place] LOWER EXTREMITY VENOUS (DVT) (ONLY [facility] & [facility])  Result Date: 07/14/2021  Lower Venous DVT Study Patient Name:  AMMI HUTT  Date of Exam:   07/13/2021 Medical Rec #: [id number]      Accession #:    [id number] Date of Birth: Dec 11, 1931       Patient Gender: F Patient Age:   50 years Exam Location:  [facility] Procedure:      VAS [place] LOWER EXTREMITY VENOUS (DVT) Referring Phys: Benjamine Mola HAMMOND --------------------------------------------------------------------------------  Indications: Edema.  Risk Factors: None identified. Limitations: Body habitus, poor ultrasound/tissue interface and patient pain tolerance. Comparison Study: No prior studies. Performing Technologist: Oliver Hum RVT  Examination Guidelines:  A complete evaluation includes B-mode imaging, spectral Doppler, color Doppler, and power Doppler as needed of all accessible portions of each vessel. Bilateral testing is considered an integral part of a complete examination. Limited examinations for reoccurring indications may be performed as noted. The reflux portion of the exam is performed with the patient in reverse Trendelenburg.  +---------+---------------+---------+-----------+----------+-------------------+   RIGHT     Compressibility Phasicity Spontaneity Properties Thrombus Aging       +---------+---------------+---------+-----------+----------+-------------------+  CFV       Full            Yes       Yes                                         +---------+---------------+---------+-----------+----------+-------------------+  SFJ       Full                                                                  +---------+---------------+---------+-----------+----------+-------------------+  FV Prox   Full                                                                  +---------+---------------+---------+-----------+----------+-------------------+  FV Mid    Full                                                                  +---------+---------------+---------+-----------+----------+-------------------+  FV Distal                 Yes       Yes                                         +---------+---------------+---------+-----------+----------+-------------------+  PFV       Full                                                                  +---------+---------------+---------+-----------+----------+-------------------+  POP       Full            Yes       Yes                                         +---------+---------------+---------+-----------+----------+-------------------+  PTV       Full                                                                  +---------+---------------+---------+-----------+----------+-------------------+  PERO                                                       Not well visualized  +---------+---------------+---------+-----------+----------+-------------------+   +----+---------------+---------+-----------+----------+--------------+  LEFT Compressibility Phasicity Spontaneity Properties Thrombus Aging  +----+---------------+---------+-----------+----------+--------------+  CFV  Full            Yes       Yes                                     +----+---------------+---------+-----------+----------+--------------+    Summary: RIGHT: - There is no evidence of deep vein thrombosis in the lower extremity. However, portions of this examination were limited- see technologist comments above.  - No cystic structure found in the popliteal fossa.  LEFT: - No evidence of common femoral vein obstruction.  *See table(s) above for measurements and observations. Electronically signed by Orlie Pollen on 07/14/2021 at 6:52:13 AM.    Final     Microbiology: No results found for this or any previous visit (from the past 240 hour(s)).   Labs: Basic Metabolic Panel: Recent Labs  Lab 07/13/21 1000 07/14/21 0735 07/15/21 0207 07/15/21 1746  NA 134* 137 132*  --   K 4.0 3.0* 4.3  --   CL 97* 99 99  --   CO2 23 30 27   --   GLUCOSE 138* 93 149*  --   BUN 66* 42* 42*  --   CREATININE 1.71* 1.26* 1.30*  --   CALCIUM 9.2 8.5* 8.3*  --   MG  --   --   --  1.9   Liver Function Tests: Recent Labs  Lab 07/13/21 1000  AST 18  ALT 24  ALKPHOS 95  BILITOT 0.4  PROT 6.9  ALBUMIN 3.5   No results for input(s): LIPASE, AMYLASE in the last 168 hours. No results for input(s): AMMONIA in the last 168 hours. CBC: Recent Labs  Lab 07/13/21 1000 07/15/21 0207  WBC 12.2* 9.4  NEUTROABS 9.0*  --   HGB 11.0* 10.0*  HCT 33.9* 30.1*  MCV 93.4 93.5  PLT 288 251   Cardiac Enzymes: No results for input(s): CKTOTAL, CKMB, CKMBINDEX, TROPONINI in the last 168 hours. BNP: BNP (last 3 results) Recent Labs    07/13/21 1000  BNP 23.5    ProBNP (last 3 results) No results for input(s): PROBNP in the last 8760 hours.  CBG: No results for input(s): GLUCAP in the last 168 hours.     Signed:  Kayleen Memos, MD [facility] 07/16/2021, 11:13 AM

## 2021-07-16 NOTE — Progress Notes (Signed)
Mobility Specialist Criteria Algorithm Info.    07/16/21 1500  Pain Assessment  Pain Assessment Faces  Faces Pain Scale 2 (no pain soreness per pt)  Pain Location R hip, groin  Pain Descriptors / Indicators Guarding;Aching  Pain Intervention(s) Limited activity within patient's tolerance  Mobility  Bed Position Chair (in chair before and after)  Activity Transferred to/from [facility]  Range of Motion/Exercises Active;All extremities  Level of Assistance Minimal assist, patient does 75% or more  Assistive Device Front wheel walker;BSC  RLE Weight Bearing PWB  RLE Partial Weight Bearing Percentage or Pounds 50%  Activity Response Tolerated well   Patient received on BSC. Declined further mobility d/t LE tingling and numbness. Requested assistance back to recliner chair. Pt stood with minimal HHA + cues for hand placement. Upon standing required max A for pericare, and was min guard to pivot to recliner chair. Was left in recliner with all needs met, call bell in reach.  07/16/2021 3:36 PM  [name], [place], [place]  KHTXH:[phone] Office: [phone]

## 2021-07-16 NOTE — Telephone Encounter (Signed)
Patient is in the hospital and would like for a dr to stop by and check in on her. Please advise

## 2021-07-16 NOTE — Progress Notes (Signed)
Inpatient Rehabilitation Admissions Coordinator   I met at bedside with patient and spoke with her son, Shanon Brow, by phone. Case also discussed with Dr Naaman Plummer from [facility]. Her daughter to come after CIR discharge to provide caregiver support for 2 weeks. I will have a Cir bed tomorrow and can plan admit. No bed is available today. I will alert acute team and TOC.  Danne Baxter, RN, MSN Rehab Admissions Coordinator [phone] 07/16/2021 12:22 PM

## 2021-07-16 NOTE — Care Management Important Message (Signed)
Important Message  Patient Details  Name: [name] MRN: [id number] Date of Birth: [birth year]   Medicare Important Message Given:  Yes     Hannah Beat 07/16/2021, 10:51 AM

## 2021-07-16 NOTE — Progress Notes (Signed)
Occupational Therapy Treatment Patient Details Name: [name] MRN: [id number] DOB: July 26, 1931 Today's Date: 07/16/2021   History of present illness 88 yo female presents to [facility] on 1/23 with R hip and BLE swelling since steroid injection ~1/16. Imaging shows R superior and inferior rami fractures, PWB 50% RLE. Pt also with AKI, PMH includes HTN, HLD, afib, sick sinus syndrome and complete heart block status post pacemaker placement, CKD 3.   OT comments  Pt needing to use BSC, but had not called for help. Assisted to transfer with min assist and verbal cues for precautions. Completed seated grooming with set up. Pt left up on [facility] with call button, NT notified.   Recommendations for follow up therapy are one component of a multi-disciplinary discharge planning process, led by the attending physician.  Recommendations may be updated based on patient status, additional functional criteria and insurance authorization.    Follow Up Recommendations  Acute inpatient rehab (3hours/day)    Assistance Recommended at Discharge Intermittent Supervision/Assistance  Patient can return home with the following  A little help with bathing/dressing/bathroom;A little help with walking and/or transfers;Assistance with cooking/housework;Assist for transportation;Help with stairs or ramp for entrance;Direct supervision/assist for medications management   Equipment Recommendations  Tub/shower seat    Recommendations for Other Services      Precautions / Restrictions Precautions Precautions: Fall Restrictions Weight Bearing Restrictions: Yes RLE Weight Bearing: Partial weight bearing RLE Partial Weight Bearing Percentage or Pounds: 50%       Mobility Bed Mobility Overal bed mobility: Modified Independent             General bed mobility comments: HOB, use of rail, increased time    Transfers Overall transfer level: Needs assistance Equipment used: Rolling walker (2 wheels) Transfers: Sit  to/from Stand, Bed to chair/wheelchair/BSC Sit to Stand: Min assist Stand pivot transfers: Min assist         General transfer comment: steadying assist, increased time, cues for precautions     Balance Overall balance assessment: Needs assistance, History of Falls Sitting-balance support: No upper extremity supported Sitting balance-Leahy Scale: Good     Standing balance support: Bilateral upper extremity supported, During functional activity, Reliant on assistive device for balance Standing balance-Leahy Scale: Poor                             ADL either performed or assessed with clinical judgement   ADL Overall ADL's : Needs assistance/impaired     Grooming: Wash/dry hands;Wash/dry face;Oral care;Sitting;Set up               Lower Body Dressing: Total assistance;Sitting/lateral leans Lower Body Dressing Details (indicate cue type and reason): to adjust socks Toilet Transfer: Minimal assistance;Stand-pivot;Rolling walker (2 wheels);BSC/3in1                  Extremity/Trunk Assessment              Vision       Perception     Praxis      Cognition Arousal/Alertness: Awake/alert Behavior During Therapy: WFL for tasks assessed/performed, Anxious Overall Cognitive Status: No family/caregiver present to determine baseline cognitive functioning Area of Impairment: Memory, Problem solving                     Memory: Decreased recall of precautions       Problem Solving: Decreased initiation General Comments: pt reports feeling "foggy"  Exercises      Shoulder Instructions       General Comments      Pertinent Vitals/ Pain       Pain Assessment Pain Assessment: Faces Faces Pain Scale: Hurts little more Pain Location: R hip, groin Pain Descriptors / Indicators: Sore, Discomfort Pain Intervention(s): Monitored during session  Home Living                                          Prior  Functioning/Environment              Frequency  Min 3X/week        Progress Toward Goals  OT Goals(current goals can now be found in the care plan section)  Progress towards OT goals: Progressing toward goals  Acute Rehab OT Goals OT Goal Formulation: With patient Time For Goal Achievement: 07/28/21 Potential to Achieve Goals: Good  Plan Discharge plan remains appropriate    Co-evaluation                 AM-PAC OT "6 Clicks" Daily Activity     Outcome Measure   Help from another person eating meals?: None Help from another person taking care of personal grooming?: A Little Help from another person toileting, which includes using toliet, bedpan, or urinal?: A Lot Help from another person bathing (including washing, rinsing, drying)?: A Lot Help from another person to put on and taking off regular upper body clothing?: A Little Help from another person to put on and taking off regular lower body clothing?: Total 6 Click Score: 15    End of Session Equipment Utilized During Treatment: Gait belt;Rolling walker (2 wheels)  OT Visit Diagnosis: Unsteadiness on feet (R26.81);Other abnormalities of gait and mobility (R26.89);Muscle weakness (generalized) (M62.81)   Activity Tolerance Patient tolerated treatment well   Patient Left Other (comment) (on commode)   Nurse Communication Other (comment) (NT aware pt left up on [facility])        Time: 8250-5397 OT Time Calculation (min): 15 min  Charges: OT General Charges $OT Visit: 1 Visit OT Treatments $Self Care/Home Management : 8-22 mins  Nestor Lewandowsky, OTR/L Acute Rehabilitation Services Pager: [phone] Office: [phone]   Malka So 07/16/2021, 9:16 AM

## 2021-07-17 ENCOUNTER — Encounter (HOSPITAL_COMMUNITY): Payer: Self-pay | Admitting: Physical Medicine and Rehabilitation

## 2021-07-17 ENCOUNTER — Inpatient Hospital Stay (HOSPITAL_COMMUNITY)
Admission: RE | Admit: 2021-07-17 | Discharge: 2021-07-25 | DRG: 560 | Disposition: A | Discharge status: Home Health | Payer: Medicare Other | Source: Intra-hospital | Attending: Physical Medicine and Rehabilitation | Admitting: Physical Medicine and Rehabilitation

## 2021-07-17 ENCOUNTER — Other Ambulatory Visit: Payer: Self-pay

## 2021-07-17 DIAGNOSIS — E785 Hyperlipidemia, unspecified: Secondary | ICD-10-CM | POA: Diagnosis present

## 2021-07-17 DIAGNOSIS — Z8052 Family history of malignant neoplasm of bladder: Secondary | ICD-10-CM

## 2021-07-17 DIAGNOSIS — Z79899 Other long term (current) drug therapy: Secondary | ICD-10-CM

## 2021-07-17 DIAGNOSIS — I495 Sick sinus syndrome: Secondary | ICD-10-CM | POA: Diagnosis present

## 2021-07-17 DIAGNOSIS — K219 Gastro-esophageal reflux disease without esophagitis: Secondary | ICD-10-CM | POA: Diagnosis present

## 2021-07-17 DIAGNOSIS — I959 Hypotension, unspecified: Secondary | ICD-10-CM | POA: Diagnosis not present

## 2021-07-17 DIAGNOSIS — N1831 Chronic kidney disease, stage 3a: Secondary | ICD-10-CM | POA: Diagnosis present

## 2021-07-17 DIAGNOSIS — I129 Hypertensive chronic kidney disease with stage 1 through stage 4 chronic kidney disease, or unspecified chronic kidney disease: Secondary | ICD-10-CM | POA: Diagnosis present

## 2021-07-17 DIAGNOSIS — Z823 Family history of stroke: Secondary | ICD-10-CM

## 2021-07-17 DIAGNOSIS — E039 Hypothyroidism, unspecified: Secondary | ICD-10-CM | POA: Diagnosis present

## 2021-07-17 DIAGNOSIS — X58XXXD Exposure to other specified factors, subsequent encounter: Secondary | ICD-10-CM | POA: Diagnosis present

## 2021-07-17 DIAGNOSIS — Z888 Allergy status to other drugs, medicaments and biological substances status: Secondary | ICD-10-CM

## 2021-07-17 DIAGNOSIS — Z7982 Long term (current) use of aspirin: Secondary | ICD-10-CM | POA: Diagnosis not present

## 2021-07-17 DIAGNOSIS — K5900 Constipation, unspecified: Secondary | ICD-10-CM | POA: Diagnosis not present

## 2021-07-17 DIAGNOSIS — E669 Obesity, unspecified: Secondary | ICD-10-CM | POA: Diagnosis present

## 2021-07-17 DIAGNOSIS — Z6832 Body mass index (BMI) 32.0-32.9, adult: Secondary | ICD-10-CM

## 2021-07-17 DIAGNOSIS — Z882 Allergy status to sulfonamides status: Secondary | ICD-10-CM | POA: Diagnosis not present

## 2021-07-17 DIAGNOSIS — S32591S Other specified fracture of right pubis, sequela: Secondary | ICD-10-CM | POA: Diagnosis not present

## 2021-07-17 DIAGNOSIS — F411 Generalized anxiety disorder: Secondary | ICD-10-CM | POA: Diagnosis not present

## 2021-07-17 DIAGNOSIS — Z881 Allergy status to other antibiotic agents status: Secondary | ICD-10-CM | POA: Diagnosis not present

## 2021-07-17 DIAGNOSIS — S32511D Fracture of superior rim of right pubis, subsequent encounter for fracture with routine healing: Secondary | ICD-10-CM | POA: Diagnosis not present

## 2021-07-17 DIAGNOSIS — I480 Paroxysmal atrial fibrillation: Secondary | ICD-10-CM | POA: Diagnosis present

## 2021-07-17 DIAGNOSIS — S32591D Other specified fracture of right pubis, subsequent encounter for fracture with routine healing: Principal | ICD-10-CM

## 2021-07-17 DIAGNOSIS — N179 Acute kidney failure, unspecified: Secondary | ICD-10-CM | POA: Diagnosis present

## 2021-07-17 DIAGNOSIS — S32609S Unspecified fracture of unspecified ischium, sequela: Secondary | ICD-10-CM | POA: Diagnosis not present

## 2021-07-17 DIAGNOSIS — S32599S Other specified fracture of unspecified pubis, sequela: Secondary | ICD-10-CM | POA: Diagnosis not present

## 2021-07-17 DIAGNOSIS — S32301A Unspecified fracture of right ilium, initial encounter for closed fracture: Secondary | ICD-10-CM

## 2021-07-17 DIAGNOSIS — Z950 Presence of cardiac pacemaker: Secondary | ICD-10-CM

## 2021-07-17 DIAGNOSIS — S329XXA Fracture of unspecified parts of lumbosacral spine and pelvis, initial encounter for closed fracture: Secondary | ICD-10-CM | POA: Diagnosis present

## 2021-07-17 MED ORDER — ADULT MULTIVITAMIN W/MINERALS CH
1.0000 | ORAL_TABLET | Freq: Every day | ORAL | Status: DC
  Administered 2021-07-18 – 2021-07-25 (×8): 1 via ORAL
  Filled 2021-07-17 (×8): qty 1

## 2021-07-17 MED ORDER — ENSURE ENLIVE PO LIQD
237.0000 mL | Freq: Three times a day (TID) | ORAL | Status: DC
  Administered 2021-07-17 – 2021-07-22 (×15): 237 mL via ORAL

## 2021-07-17 MED ORDER — ENOXAPARIN SODIUM 30 MG/0.3ML IJ SOSY
30.0000 mg | PREFILLED_SYRINGE | INTRAMUSCULAR | Status: DC
  Administered 2021-07-17 – 2021-07-19 (×3): 30 mg via SUBCUTANEOUS
  Filled 2021-07-17 (×3): qty 0.3

## 2021-07-17 MED ORDER — PRAVASTATIN SODIUM 40 MG PO TABS
40.0000 mg | ORAL_TABLET | Freq: Every day | ORAL | Status: DC
  Administered 2021-07-17 – 2021-07-24 (×8): 40 mg via ORAL
  Filled 2021-07-17 (×8): qty 1

## 2021-07-17 MED ORDER — DILTIAZEM HCL ER COATED BEADS 180 MG PO CP24
180.0000 mg | ORAL_CAPSULE | Freq: Every day | ORAL | Status: DC
  Administered 2021-07-17 – 2021-07-24 (×8): 180 mg via ORAL
  Filled 2021-07-17 (×8): qty 1

## 2021-07-17 MED ORDER — TRAZODONE HCL 50 MG PO TABS: 25.0000 mg | ORAL_TABLET | Freq: Every evening | ORAL | Status: DC | PRN

## 2021-07-17 MED ORDER — LORAZEPAM 0.5 MG PO TABS
0.5000 mg | ORAL_TABLET | Freq: Every day | ORAL | Status: DC | PRN
  Administered 2021-07-18 – 2021-07-25 (×2): 0.5 mg via ORAL
  Filled 2021-07-17 (×2): qty 1

## 2021-07-17 MED ORDER — ASPIRIN 81 MG PO CHEW
81.0000 mg | CHEWABLE_TABLET | Freq: Every day | ORAL | Status: DC
  Administered 2021-07-17 – 2021-07-24 (×8): 81 mg via ORAL
  Filled 2021-07-17 (×8): qty 1

## 2021-07-17 MED ORDER — ENOXAPARIN SODIUM 30 MG/0.3ML IJ SOSY: 30.0000 mg | PREFILLED_SYRINGE | INTRAMUSCULAR | Status: DC

## 2021-07-17 MED ORDER — PANTOPRAZOLE SODIUM 40 MG PO TBEC
80.0000 mg | DELAYED_RELEASE_TABLET | Freq: Two times a day (BID) | ORAL | Status: DC
  Administered 2021-07-17 – 2021-07-25 (×16): 80 mg via ORAL
  Filled 2021-07-17 (×16): qty 2

## 2021-07-17 MED ORDER — METOPROLOL SUCCINATE ER 25 MG PO TB24
25.0000 mg | ORAL_TABLET | Freq: Every day | ORAL | Status: DC
  Administered 2021-07-17 – 2021-07-20 (×4): 25 mg via ORAL
  Filled 2021-07-17 (×4): qty 1

## 2021-07-17 MED ORDER — HYDROCODONE-ACETAMINOPHEN 5-325 MG PO TABS
1.0000 | ORAL_TABLET | Freq: Four times a day (QID) | ORAL | Status: DC | PRN
  Administered 2021-07-18 – 2021-07-20 (×4): 2 via ORAL
  Administered 2021-07-21 – 2021-07-22 (×2): 1 via ORAL
  Administered 2021-07-23 – 2021-07-25 (×2): 2 via ORAL
  Filled 2021-07-17: qty 1
  Filled 2021-07-17 (×3): qty 2
  Filled 2021-07-17: qty 1
  Filled 2021-07-17 (×2): qty 2
  Filled 2021-07-17 (×2): qty 1

## 2021-07-17 NOTE — H&P (Signed)
Physical Medicine and Rehabilitation Admission H&P    Chief Complaint  Patient presents with   Leg Swelling   Hip Pain  : HPI: [name] is a 88 year old right-handed female with history of hypertension, hyperlipidemia, SSS status post PPM 2009 with generator change out 2018 maintained on aspirin, CKD stage III.  Per chart review patient lives with her children.  1 level home 5 steps to entry.  She uses a rolling walker for mobility.  Her son assists with meal preparation and occasional transfers.  Presented 07/13/2021 with bilateral lower extremity edema and right hip pain.  Patient reportedly had a steroid injection into her back approxi-1 week ago with progressive worsening right lower extremity pain.  She denies any recent trauma however she did report when she was assisted onto the table for recent injection she felt the move was somewhat sudden and she felt like she hit the hard table too hard at that time..  X-ray right pelvis/hip showed step-off deformity of the right superior pubic ramus concerning for fracture.  CT of the right hip showed right superior and inferior pubic ramus fracture.  Orthopedic service follow-up Dr. Alvan Dame given the proximity of the fractures to her acetabulum advised partial weightbearing 50% right lower extremity and no surgical intervention.  Admission chemistry sodium 134, BUN 66, creatinine 1.71, WBC 12,200 troponin 12-19, urinalysis negative nitrite, BNP 23.5.    Echocardiogram with ejection fraction of 60 to 65% no wall motion abnormalities grade 1 diastolic dysfunction.  In regards to patient's AKI on CKD stage III ultrasound suggested possible obstruction with large volume of urine in the bladder however right after the procedure she had a 1 L urinary output and several hours later bladder ultrasound showed 470 mL and immediately afterwards she voided 500 mL.  No current concern for true obstruction and she did receive IV diuresis latest creatinine improved  1.30..  Maintained on Lovenox for DVT prophylaxis.  Therapy evaluations completed due to patient decreased functional mobility was admitted for a comprehensive rehab program.  Review of Systems  Constitutional:  Negative for chills and fever.  HENT:  Negative for hearing loss.   Eyes:  Negative for blurred vision and double vision.  Respiratory:  Positive for cough. Negative for shortness of breath.   Cardiovascular:  Positive for palpitations and leg swelling. Negative for chest pain.  Gastrointestinal:        GERD  Genitourinary:  Negative for dysuria, flank pain and hematuria.  Musculoskeletal:  Positive for back pain, joint pain and myalgias.  Skin:  Negative for rash.  Neurological:  Positive for weakness.  Psychiatric/Behavioral:         Anxiety  All other systems reviewed and are negative. Past Medical History:  Diagnosis Date   Allergic rhinitis    Anginal pain (HCC)    Asthmatic bronchitis    Chronic cough    Dysrhythmia    PAF   Esophageal reflux    GERD (gastroesophageal reflux disease)    History of stress test 11/20/2009   This was essentially normal,post-stress EF was hyperdynamic at 73 beats per mins, There was no scar or ischemia.   Hx of echocardiogram 11/05/2010   EF>55% showed a normal systolic function with impaired diastolic dysfuntion and also tissue doppler suggesting increased elevated left atrial pressure, she had moderate LA dilatation and mild LA dilatation. There was calcified mitral apparatus with moderate MR, mild to moderate TR and trace aortic insufficiency.   Hyperlipemia    Hypertension  Hypothyroidism    PAF (paroxysmal atrial fibrillation) ([facility])    Pneumonia    Past Surgical History:  Procedure Laterality Date   CHOLECYSTECTOMY     CYSTOSCOPY WITH BIOPSY N/A 08/29/2020   Procedure: CYSTOSCOPY WITH BIOPSY WITH FULGURATION;  Surgeon: Irine Seal, MD;  Location: [facility];  Service: Urology;  Laterality: N/A;   FOOT SURGERY     right   NASAL  SEPTUM SURGERY     PACEMAKER INSERTION  June 1st 2009   [employer] Jude [place] device, model #5826, serial #[id number], The artial lead is a Interior and spatial designer, the ventricular lead is Environmental education officer.   PPM GENERATOR CHANGEOUT N/A 02/09/2017   Procedure: PPM GENERATOR CHANGEOUT;  Surgeon: Sanda Klein, MD;  Location: [facility];  Service: Cardiovascular;  Laterality: N/A;   THYROID SURGERY     TONSILLECTOMY     TONSILLECTOMY     VESICOVAGINAL FISTULA CLOSURE W/ TAH     Family History  Problem Relation Age of Onset   Stroke Mother    Cancer Father        bladder   Stroke Sister    Social History:  reports that she has never smoked. She has never used smokeless tobacco. She reports that she does not drink alcohol and does not use drugs. Allergies:  Allergies  Allergen Reactions   Fluticasone-Salmeterol Other (See Comments)    unknown Other reaction(s): tongue/throat symptoms   Sulfa Antibiotics     Other reaction(s): gastric intol   Sulfonamide Derivatives Other (See Comments)    unknown   Benzonatate Other (See Comments)    tongue/throat symptoms in the past but tolerated more recently Other reaction(s): and tongue/throat symptoms in the past (not more recently)   Erythromycin Nausea And Vomiting and Other (See Comments)    Stomach cramps Other reaction(s): gastric intol   Meloxicam Other (See Comments)    Mild dyspnea Other reaction(s): dyspnea   Sulfamethoxazole Other (See Comments)    Gastric intolerance   Medications Prior to Admission  Medication Sig Dispense Refill   acetaminophen (TYLENOL) 500 MG tablet Take 1,000 mg by mouth every 6 (six) hours as needed for moderate pain or mild pain.     Ascorbic Acid (VITAMIN C) 100 MG tablet 500 mg daily.     aspirin 81 MG chewable tablet Chew 81 mg by mouth at bedtime.     calcium carbonate (TUMS EX) 750 MG chewable tablet Chew 1 tablet by mouth daily as needed for heartburn.     cetirizine (ZYRTEC) 10 MG tablet 1 tablet      cholecalciferol (VITAMIN D3) 25 MCG (1000 UNIT) tablet Take 1,000 Units by mouth daily.     furosemide (LASIX) 40 MG tablet Take 1 tablet (40 mg total) by mouth daily. 30 tablet 2   ibuprofen (ADVIL) 200 MG tablet Take 200 mg by mouth every 6 (six) hours as needed for moderate pain or mild pain.     lidocaine (LIDODERM) 5 % Lidoderm 5 % topical patch  APPLY 1 PATCH BY TOPICAL ROUTE ONCE DAILY (MAY WEAR UP TO 12HOURS.)     LORazepam (ATIVAN) 0.5 MG tablet Take 1 tablet (0.5 mg total) by mouth daily as needed for anxiety. 30 tablet 1   metoprolol succinate (TOPROL-XL) 25 MG 24 hr tablet TAKE 1 TABLET (25 MG TOTAL) BY MOUTH DAILY. (Patient taking differently: Take 25 mg by mouth at bedtime.) 90 tablet 3   potassium chloride SA (KLOR-CON M20) 20 MEQ tablet Take 1 tablet (  20 mEq total) by mouth daily. 30 tablet 2   pravastatin (PRAVACHOL) 40 MG tablet Take 40 mg by mouth at bedtime.     valsartan-hydrochlorothiazide (DIOVAN-HCT) 160-25 MG tablet TAKE 1 TABLET BY MOUTH EVERY DAY (Patient taking differently: Take 1 tablet by mouth daily.) 90 tablet 3   [DISCONTINUED] pantoprazole (PROTONIX) 40 MG tablet Take 80 mg by mouth 2 (two) times daily.      Drug Regimen Review Drug regimen was reviewed and remains appropriate with no significant issues identified  Home: Home Living Family/patient expects to be discharged to:: Private residence Living Arrangements:  (lives with son, Shanon Brow) Available Help at Discharge: Family, Available 24 hours/day (son works in Press photographer during the day, daughter Alyse Low to come form [place] after discharge for 2 weeks to assist) Type of Home: House Home Access: Stairs to enter [employer] of Steps: 5 Entrance Stairs-Rails: Right, Left Home Layout: One level Bathroom Shower/Tub: Multimedia programmer: Standard Bathroom Accessibility: Yes Home Equipment: Conservation officer, nature (2 wheels), BSC/3in1, Wheelchair - manual, Chartered certified accountant Additional Comments:  states her walker does not have wheels  Lives With: Son   Functional History: Prior Function Prior Level of Function : Needs assist Mobility Comments: has been using RW and w/c for functional mobility ADLs Comments: Pt reports having assist from son for meal prep and occasionally transfers on/off toilet especially since hip fracture  Functional Status:  Mobility: Bed Mobility Overal bed mobility: Modified Independent Bed Mobility: Supine to Sit Supine to sit: Min assist General bed mobility comments: in chair Transfers Overall transfer level: Needs assistance Equipment used: Rolling walker (2 wheels), 1 person hand held assist Transfers: Sit to/from Stand Sit to Stand: Min assist Bed to/from chair/wheelchair/BSC transfer type:: Stand pivot Stand pivot transfers: Min assist General transfer comment: no automatic use of UE's, requires cues to stand and sit Ambulation/Gait Ambulation/Gait assistance: Min assist Gait Distance (Feet): 6 Feet Assistive device: Rolling walker (2 wheels), 1 person hand held assist Gait Pattern/deviations: Step-to pattern, Step-through pattern, Decreased stride length, Decreased weight shift to right General Gait Details: NT Gait velocity: reduced    ADL: ADL Overall ADL's : Needs assistance/impaired Eating/Feeding: Set up, Sitting Grooming: Wash/dry hands, Wash/dry face, Brushing hair, Sitting Upper Body Bathing: Minimal assistance, Sitting Lower Body Bathing: Moderate assistance, Sitting/lateral leans Upper Body Dressing : Minimal assistance, Sitting Upper Body Dressing Details (indicate cue type and reason): dons fresh gown sitting EOB Lower Body Dressing: Total assistance, Sitting/lateral leans Lower Body Dressing Details (indicate cue type and reason): to adjust socks Toilet Transfer: Minimal assistance, Stand-pivot, BSC/3in1, Rolling walker (2 wheels) Toilet Transfer Details (indicate cue type and reason): simulated to recliner Toileting-  Clothing Manipulation and Hygiene: Maximal assistance, Sit to/from stand Toileting - Clothing Manipulation Details (indicate cue type and reason): max A to manage clothing prior to sitting Functional mobility during ADLs: Minimal assistance  Cognition: Cognition Overall Cognitive Status: No family/caregiver present to determine baseline cognitive functioning Cognition Arousal/Alertness: Awake/alert Behavior During Therapy: WFL for tasks assessed/performed, Anxious Overall Cognitive Status: No family/caregiver present to determine baseline cognitive functioning Area of Impairment: Memory Memory: Decreased short-term memory, Decreased recall of precautions Following Commands: Follows one step commands with increased time Safety/Judgement: Decreased awareness of deficits, Decreased awareness of safety Problem Solving: Decreased initiation, Requires verbal cues, Requires tactile cues General Comments: Pt is able to follow instructions but is weak and requires more details  Physical Exam: Blood pressure (!) 142/74, pulse 73, temperature 98.5 F (36.9 C), temperature source Oral, resp. rate  13, height 4\' 11"  (1.499 m), weight 70 kg, SpO2 99 %. Physical Exam Constitutional:      General: She is not in acute distress.    Appearance: She is obese.  HENT:     Head: Normocephalic and atraumatic.     Nose: Nose normal.     Mouth/Throat:     Mouth: Mucous membranes are dry.  Eyes:     Extraocular Movements: Extraocular movements intact.     Conjunctiva/sclera: Conjunctivae normal.     Pupils: Pupils are equal, round, and reactive to light.  Cardiovascular:     Rate and Rhythm: Normal rate and regular rhythm.     Heart sounds: No murmur heard.   No gallop.  Pulmonary:     Effort: Pulmonary effort is normal. No respiratory distress.     Breath sounds: Normal breath sounds. No wheezing.  Abdominal:     General: Bowel sounds are normal. There is no distension.     Palpations: Abdomen is  soft.     Tenderness: There is no abdominal tenderness.  Musculoskeletal:     Cervical back: Normal range of motion and neck supple.     Comments: Trace bilateral LE edema  Skin:    General: Skin is warm and dry.     Comments: Scattered ecchymoses on bilateral LE's, a few healed/dried blisters as well.   Neurological:     Mental Status: She is alert.     Comments: Patient is alert.  No acute distress.  Oriented x3 and follows commands. Reasonable insight and awareness. STM seems intact. UE motor 5/5. LE: 4-/5 HF, KE and 4/5 ADF/PF. Sensory exam normal for light touch and pain in all 4 limbs. No limb ataxia or cerebellar signs. No abnormal tone appreciated.    Psychiatric:        Mood and Affect: Mood normal.        Behavior: Behavior normal.    Results for orders placed or performed during the hospital encounter of 07/13/21 (from the past 48 hour(s))  Magnesium     Status: None   Collection Time: 07/15/21  5:46 PM  Result Value Ref Range   Magnesium 1.9 1.7 - 2.4 mg/dL    Comment: Performed at [facility], [place] [address] [place], [place] [address]   ECHOCARDIOGRAM COMPLETE  Result Date: 07/15/2021    ECHOCARDIOGRAM REPORT   Patient Name:   IRINE HEMINGER Date of Exam: 07/15/2021 Medical Rec #:  [id number]     Height:       59.0 in Accession #:    [id number]    Weight:       154.3 lb Date of Birth:  1932-02-15      BSA:          1.652 m Patient Age:    29 years      BP:           133/44 mmHg Patient Gender: F             HR:           70 bpm. Exam Location:  Inpatient Procedure: 2D Echo Indications:    CHF  History:        Patient has prior history of Echocardiogram examinations, most                 recent 10/23/2014. Arrythmias:Atrial Fibrillation; Risk                 Factors:Hypertension.  Sonographer:  Jefferey Pica Referring Phys: Reading  1. Left ventricular ejection fraction, by estimation, is 60 to 65%. The left ventricle has normal function. The left  ventricle has no regional wall motion abnormalities. There is mild left ventricular hypertrophy. Left ventricular diastolic parameters are consistent with Grade I diastolic dysfunction (impaired relaxation).  2. Right ventricular systolic function is normal. The right ventricular size is normal. The estimated right ventricular systolic pressure is 00.3 mmHg.  3. Left atrial size was mildly dilated.  4. The mitral valve is normal in structure. Trivial mitral valve regurgitation. No evidence of mitral stenosis.  5. The aortic valve is tricuspid. Aortic valve regurgitation is not visualized. No aortic stenosis is present.  6. The inferior vena cava is normal in size with greater than 50% respiratory variability, suggesting right atrial pressure of 3 mmHg. FINDINGS  Left Ventricle: Left ventricular ejection fraction, by estimation, is 60 to 65%. The left ventricle has normal function. The left ventricle has no regional wall motion abnormalities. The left ventricular internal cavity size was normal in size. There is  mild left ventricular hypertrophy. Left ventricular diastolic parameters are consistent with Grade I diastolic dysfunction (impaired relaxation). Right Ventricle: The right ventricular size is normal. No increase in right ventricular wall thickness. Right ventricular systolic function is normal. The tricuspid regurgitant velocity is 2.67 m/s, and with an assumed right atrial pressure of 3 mmHg, the estimated right ventricular systolic pressure is 49.1 mmHg. Left Atrium: Left atrial size was mildly dilated. Right Atrium: Right atrial size was normal in size. Pericardium: There is no evidence of pericardial effusion. Mitral Valve: The mitral valve is normal in structure. Trivial mitral valve regurgitation. No evidence of mitral valve stenosis. Tricuspid Valve: The tricuspid valve is normal in structure. Tricuspid valve regurgitation is trivial. Aortic Valve: The aortic valve is tricuspid. Aortic valve  regurgitation is not visualized. No aortic stenosis is present. Aortic valve peak gradient measures 9.7 mmHg. Pulmonic Valve: The pulmonic valve was normal in structure. Pulmonic valve regurgitation is not visualized. Aorta: The aortic root is normal in size and structure. Venous: The inferior vena cava is normal in size with greater than 50% respiratory variability, suggesting right atrial pressure of 3 mmHg. IAS/Shunts: No atrial level shunt detected by color flow Doppler. Additional Comments: A device lead is visualized in the right ventricle.  LEFT VENTRICLE PLAX 2D LVIDd:         4.40 cm   Diastology LVIDs:         3.00 cm   LV e' medial:    6.60 cm/s LV PW:         1.20 cm   LV E/e' medial:  9.5 LV IVS:        1.20 cm   LV e' lateral:   6.74 cm/s LVOT diam:     2.00 cm   LV E/e' lateral: 9.3 LV SV:         95 LV SV Index:   58 LVOT Area:     3.14 cm  RIGHT VENTRICLE            IVC RV Basal diam:  2.70 cm    IVC diam: 1.50 cm RV S prime:     6.94 cm/s TAPSE (M-mode): 2.6 cm LEFT ATRIUM             Index        RIGHT ATRIUM           Index LA diam:  4.00 cm 2.42 cm/m   RA Area:     15.20 cm LA Vol (A2C):   49.2 ml 29.78 ml/m  RA Volume:   42.20 ml  25.55 ml/m LA Vol (A4C):   37.0 ml 22.40 ml/m LA Biplane Vol: 44.8 ml 27.12 ml/m  AORTIC VALVE                 PULMONIC VALVE AV Area (Vmax): 2.66 cm     PV Vmax:       1.18 m/s AV Vmax:        156.00 cm/s  PV Peak grad:  5.6 mmHg AV Peak Grad:   9.7 mmHg LVOT Vmax:      132.00 cm/s LVOT Vmean:     87.000 cm/s LVOT VTI:       0.303 m  AORTA Ao Root diam: 2.80 cm Ao Asc diam:  2.80 cm MITRAL VALVE                TRICUSPID VALVE MV Area (PHT): 3.28 cm     TR Peak grad:   28.5 mmHg MV Decel Time: 231 msec     TR Vmax:        267.00 cm/s MV E velocity: 63.00 cm/s MV A velocity: 107.00 cm/s  SHUNTS MV E/A ratio:  0.59         Systemic VTI:  0.30 m                             Systemic Diam: 2.00 cm Dalton McleanMD Electronically signed by Franki Monte  Signature Date/Time: 07/15/2021/5:00:33 PM    Final        Medical Problem List and Plan: 1. Functional deficits secondary to right superior and inferior pubic ramus fractures of unclear etiology. Pt denies recent fall.   -Partial weightbearing RLE due to close proximity of SPR fx to acetabulum  -patient may shower  -ELOS/Goals: 10-14 days, goals supervision with PT, sup/min OT 2.  Antithrombotics: -DVT/anticoagulation:  Pharmaceutical: Lovenox.Vascular study negative  -antiplatelet therapy: Aspirin 81 mg daily 3. Pain Management: Hydrocodone as needed 4. Mood: Ativan 0.5 mg daily as needed, pt states that she used this at home  -low dose trazodone if needed for sleep as well  -antipsychotic agents: N/A 5. Neuropsych: This patient is capable of making decisions on her own behalf. 6. Skin/Wound Care: Routine skin checks 7. Fluids/Electrolytes/Nutrition: Routine in and outs with follow-up chemistries 8.  AKI on CKD stage III.  -check labs while here  -pt appears a little dry on exam. Encourage fluids. 9.  PAF/SSS/PPM.  Cardizem 180 mg daily, Toprol 25 mg nightly.  Follow-up per cardiology services  -monitor activity tolerance with therapies 10.  Hyperlipidemia.  Pravachol 11.  GERD.  Protonix    Cathlyn Parsons, PA-C 07/17/2021

## 2021-07-17 NOTE — Progress Notes (Signed)
Occupational Therapy Treatment Patient Details Name: [name] MRN: [id number] DOB: 1932-02-05 Today's Date: 07/17/2021   History of present illness 88 yo female [facility] on 1/23 with R hip and BLE swelling since steroid injection ~1/16. Imaging shows R superior and inferior rami fractures, PWB 50% RLE. Pt also with AKI, PMH includes HTN, HLD, afib, sick sinus syndrome and complete heart block status post pacemaker placement, CKD 3.   OT comments  Pt continues to have difficulty sequencing technique to maintain 50% weight bearing on R LE with transfers, needing maximum verbal cues. She transferred chair<>BSC with min assist, max assist for posterior pericare. Pt completed seated grooming. Pt is eager to go to rehab and return home.    Recommendations for follow up therapy are one component of a multi-disciplinary discharge planning process, led by the attending physician.  Recommendations may be updated based on patient status, additional functional criteria and insurance authorization.    Follow Up Recommendations  Acute inpatient rehab (3hours/day)    Assistance Recommended at Discharge Frequent or constant Supervision/Assistance  Patient can return home with the following  A little help with bathing/dressing/bathroom;A little help with walking and/or transfers;Assistance with cooking/housework;Assist for transportation;Help with stairs or ramp for entrance;Direct supervision/assist for medications management   Equipment Recommendations  Tub/shower seat    Recommendations for Other Services      Precautions / Restrictions Precautions Precautions: Fall Restrictions Weight Bearing Restrictions: Yes RLE Weight Bearing: Partial weight bearing RLE Partial Weight Bearing Percentage or Pounds: 50       Mobility Bed Mobility               General bed mobility comments: in chair    Transfers                         Balance Overall balance assessment:  Needs assistance, History of Falls Sitting-balance support: Feet supported Sitting balance-Leahy Scale: Good     Standing balance support: Bilateral upper extremity supported Standing balance-Leahy Scale: Poor                             ADL either performed or assessed with clinical judgement   ADL Overall ADL's : Needs assistance/impaired Eating/Feeding: Set up;Sitting   Grooming: Wash/dry hands;Wash/dry face;Brushing hair;Sitting                   Toilet Transfer: Minimal assistance;Stand-pivot;BSC/3in1;Rolling walker (2 wheels)   Toileting- Clothing Manipulation and Hygiene: Maximal assistance;Sit to/from stand              Extremity/Trunk Assessment              Vision       Perception     Praxis      Cognition Arousal/Alertness: Awake/alert Behavior During Therapy: WFL for tasks assessed/performed, Anxious Overall Cognitive Status: No family/caregiver present to determine baseline cognitive functioning Area of Impairment: Memory                     Memory: Decreased short-term memory, Decreased recall of precautions                  Exercises      Shoulder Instructions       General Comments      Pertinent Vitals/ Pain       Pain Assessment Pain Assessment: Faces Faces Pain Scale: Hurts a little bit Pain  Location: R groin Pain Descriptors / Indicators: Sore, Discomfort  Home Living                                          Prior Functioning/Environment              Frequency  Min 3X/week        Progress Toward Goals  OT Goals(current goals can now be found in the care plan section)  Progress towards OT goals: Progressing toward goals  Acute Rehab OT Goals OT Goal Formulation: With patient Time For Goal Achievement: 07/28/21 Potential to Achieve Goals: Good  Plan Discharge plan remains appropriate    Co-evaluation                 AM-PAC OT "6 Clicks" Daily  Activity     Outcome Measure   Help from another person eating meals?: None Help from another person taking care of personal grooming?: A Little Help from another person toileting, which includes using toliet, bedpan, or urinal?: A Lot Help from another person bathing (including washing, rinsing, drying)?: A Lot Help from another person to put on and taking off regular upper body clothing?: A Little Help from another person to put on and taking off regular lower body clothing?: Total 6 Click Score: 15    End of Session Equipment Utilized During Treatment: Gait belt;Rolling walker (2 wheels)  OT Visit Diagnosis: Unsteadiness on feet (R26.81);Other abnormalities of gait and mobility (R26.89);Muscle weakness (generalized) (M62.81)   Activity Tolerance Patient tolerated treatment well   Patient Left in chair;with call bell/phone within reach;with chair alarm set   Nurse Communication          Time: [phone] OT Time Calculation (min): 16 min  Charges: OT General Charges $OT Visit: 1 Visit OT Treatments $Self Care/Home Management : 8-22 mins  Nestor Lewandowsky, OTR/L Acute Rehabilitation Services Pager: [phone] Office: [phone]  Malka So 07/17/2021, 9:38 AM

## 2021-07-17 NOTE — Progress Notes (Signed)
Meredith Staggers, MD  Physician Physical Medicine and Rehabilitation PMR Pre-admission     Signed Date of Service:  07/16/2021  3:37 PM  Related encounter: ED to Hosp-Admission (Discharged) from 07/13/2021 in [place]      Show:Clear all _0 Written_1 Templated_2 Copied  Added by: _3 Cristina Gong, RN_4 Meredith Staggers, MD  _5 Hover for details                                                                                                                                                                                                                                                                                                                                                                                                                                                                      PMR Admission Coordinator Pre-Admission Assessment   Patient: [name] is an 88 y.o., female MRN: [id number] DOB: 02/14/1932 Height: _6  (149.9 cm) Weight: 70 kg   Insurance Information HMO:     PPO:      PCP:      IPA:      80/20:      OTHER:  PRIMARY: Medicare a and b      Policy#: [id number]      Subscriber:  pt Benefits:  Phone #: passport one source online     Name: 1/26 Eff. Date: a 10/20/1998 and b 12/20/1999     Deduct: $1600      Out of Pocket Max: none      Life Max: none CIR: 100%      SNF: 20 full days Outpatient: 80%     Co-Pay: 20% Home Health: 100%      Co-Pay: none DME: 80%     Co-Pay: 205 Providers: pt choice  SECONDARY: [place] american medicare supplement      Policy#: [id number]   Financial Counselor:       Phone#:    The Data Collection Information Summary for patients in Inpatient Rehabilitation Facilities with attached Privacy Act Clements Care Records was  provided and verbally reviewed with: Patient and Family   Emergency Contact Information Contact Information       Name Relation Home Work Mobile    [place] Son [phone]   [phone]         Current Medical History  Patient Admitting Diagnosis: Fall, debility   History of Present Illness:  88 year old right-handed female with history of hypertension, hyperlipidemia, SSS status post PPM 2009 with generator change out 2018 maintained on aspirin, CKD stage III.  Presented 07/13/2021 with bilateral lower extremity edema and right hip pain.  Patient reportedly had a steroid injection into her back approxi-1 week ago with progressive worsening right lower extremity pain.  She denies any recent trauma however she did report when she was assisted onto the table for recent injection she felt the move was somewhat sudden and she felt like she hit the hard table too hard at that time..  X-ray right pelvis/hip showed step-off deformity of the right superior pubic ramus concerning for fracture.  CT of the right hip showed right superior and inferior pubic ramus fracture.  Orthopedic service follow-up Dr. Alvan Dame given the proximity of the fractures to her acetabulum advised partial weightbearing 50% right lower extremity and no surgical intervention.  Admission chemistry sodium 134, BUN 66, creatinine 1.71, WBC 12,200 troponin 12-19, urinalysis negative nitrite, BNP 23.5.    Echocardiogram with ejection fraction of 60 to 65% no wall motion abnormalities grade 1 diastolic dysfunction.  In regards to patient's AKI on CKD stage III ultrasound suggested possible obstruction with large volume of urine in the bladder however right after the procedure she had a 1 L urinary output and several hours later bladder ultrasound showed 470 mL and immediately afterwards she voided 500 mL.  No current concern for true obstruction and she did receive IV diuresis latest creatinine improved 1.30..  Maintained on Lovenox for DVT  prophylaxis.     Patient's medical record from [facility] has been reviewed by the rehabilitation admission coordinator and physician.   Past Medical History      Past Medical History:  Diagnosis Date   Allergic rhinitis     Anginal pain (HCC)     Asthmatic bronchitis     Chronic cough     Dysrhythmia      PAF   Esophageal reflux     GERD (gastroesophageal reflux disease)     History of stress test 11/20/2009    This was essentially normal,post-stress EF was hyperdynamic at 73 beats per mins, There was no scar or ischemia.   Hx of echocardiogram 11/05/2010    EF>55% showed a normal systolic function with impaired diastolic dysfuntion and also tissue doppler suggesting increased elevated left atrial pressure, she  had moderate LA dilatation and mild LA dilatation. There was calcified mitral apparatus with moderate MR, mild to moderate TR and trace aortic insufficiency.   Hyperlipemia     Hypertension     Hypothyroidism     PAF (paroxysmal atrial fibrillation) ([place])     Pneumonia      Has the patient had major surgery during 100 days prior to admission? No   Family History   family history includes Cancer in her father; Stroke in her mother and sister.   Current Medications   Current Facility-Administered Medications:    aspirin chewable tablet 81 mg, 81 mg, Oral, QHS, Gardner, Jared M, DO, 81 mg at 07/16/21 2130   diltiazem (CARDIZEM CD) 24 hr capsule 180 mg, 180 mg, Oral, QHS, Alcario Drought, Jared M, DO, 180 mg at 07/16/21 2130   enoxaparin (LOVENOX) injection 30 mg, 30 mg, Subcutaneous, Q24H, Alcario Drought, Jared M, DO, 30 mg at 07/16/21 2131   feeding supplement (ENSURE ENLIVE / ENSURE PLUS) liquid 237 mL, 237 mL, Oral, TID BM, Caren Griffins, MD, 237 mL at 07/17/21 0092   HYDROcodone-acetaminophen (NORCO/VICODIN) 5-325 MG per tablet 1-2 tablet, 1-2 tablet, Oral, Q6H PRN, Etta Quill, DO, 2 tablet at 07/15/21 2131   LORazepam (ATIVAN) tablet 0.5 mg, 0.5 mg, Oral, Daily  PRN, Etta Quill, DO, 0.5 mg at 07/15/21 1326   metoprolol succinate (TOPROL-XL) 24 hr tablet 25 mg, 25 mg, Oral, QHS, Alcario Drought, Jared M, DO, 25 mg at 07/16/21 2130   metoprolol tartrate (LOPRESSOR) injection 2.5 mg, 2.5 mg, Intravenous, Q6H PRN, [place] Crane, Carole N, DO   morphine 2 MG/ML injection 0.5 mg, 0.5 mg, Intravenous, Q2H PRN, Alcario Drought, Jared M, DO   multivitamin with minerals tablet 1 tablet, 1 tablet, Oral, Daily, Caren Griffins, MD, 1 tablet at 07/17/21 3300   pantoprazole (PROTONIX) EC tablet 80 mg, 80 mg, Oral, BID, Jennette Kettle M, DO, 80 mg at 07/17/21 7622   pravastatin (PRAVACHOL) tablet 40 mg, 40 mg, Oral, QHS, Gardner, Jared M, DO, 40 mg at 07/16/21 2130   Patients Current Diet:  Diet Order                  Diet Heart Room service appropriate? Yes; Fluid consistency: Thin  Diet effective now                       Precautions / Restrictions Precautions Precautions: Fall Precaution Comments: chair and bed alarm Restrictions Weight Bearing Restrictions: Yes RLE Weight Bearing: Partial weight bearing RLE Partial Weight Bearing Percentage or Pounds: 50    Has the patient had 2 or more falls or a fall with injury in the past year? Yes   Prior Activity Level Limited Community (1-2x/wk): decline in funciton over past 6 months using RW and w/c   Prior Functional Level Self Care: Did the patient need help bathing, dressing, using the toilet or eating? Needed some help   Indoor Mobility: Did the patient need assistance with walking from room to room (with or without device)? Needed some help   Stairs: Did the patient need assistance with internal or external stairs (with or without device)? Needed some help   Functional Cognition: Did the patient need help planning regular tasks such as shopping or remembering to take medications? Needed some help   Patient Information Are you of Hispanic, Latino/a,or Spanish origin?: A. No, not of Hispanic, Latino/a, or Spanish  origin What is your race?: A. White Do you need  or want an interpreter to communicate with a doctor or health care staff?: 0. No   Patient's Response To:  Health Literacy and Transportation Is the patient able to respond to health literacy and transportation needs?: Yes Health Literacy - How often do you need to have someone help you when you read instructions, pamphlets, or other written material from your doctor or pharmacy?: Never In the past 12 months, has lack of transportation kept you from medical appointments or from getting medications?: No In the past 12 months, has lack of transportation kept you from meetings, work, or from getting things needed for daily living?: No   Development worker, international aid / [place] Devices/Equipment: Wellsite geologist, Radio producer (specify quad or straight), Bedside commode/3-in-1, Wheelchair, Environmental consultant (specify type) Home Equipment: Conservation officer, nature (2 wheels), BSC/3in1, Wheelchair - manual, Chartered certified accountant   Prior Device Use: Indicate devices/aids used by the patient prior to current illness, exacerbation or injury? Manual wheelchair and Walker   Current Functional Level Cognition   Overall Cognitive Status: No family/caregiver present to determine baseline cognitive functioning Following Commands: Follows one step commands with increased time Safety/Judgement: Decreased awareness of deficits, Decreased awareness of safety General Comments: Pt is able to follow instructions but is weak and requires more details    Extremity Assessment (includes Sensation/Coordination)   Upper Extremity Assessment: Generalized weakness  Lower Extremity Assessment: Defer to PT evaluation RLE Deficits / Details: distal edema bilat R>L and more tender on R, able to perform quad set and limited ROM hip flexion/knee flexion given pain RLE: Unable to fully assess due to pain     ADLs   Overall ADL's : Needs assistance/impaired Eating/Feeding: Set up, Sitting Grooming:  Wash/dry hands, Wash/dry face, Brushing hair, Sitting Upper Body Bathing: Minimal assistance, Sitting Lower Body Bathing: Moderate assistance, Sitting/lateral leans Upper Body Dressing : Minimal assistance, Sitting Upper Body Dressing Details (indicate cue type and reason): dons fresh gown sitting EOB Lower Body Dressing: Total assistance, Sitting/lateral leans Lower Body Dressing Details (indicate cue type and reason): to adjust socks Toilet Transfer: Minimal assistance, Stand-pivot, BSC/3in1, Rolling walker (2 wheels) Toilet Transfer Details (indicate cue type and reason): simulated to recliner Toileting- Clothing Manipulation and Hygiene: Maximal assistance, Sit to/from stand Toileting - Clothing Manipulation Details (indicate cue type and reason): max A to manage clothing prior to sitting Functional mobility during ADLs: Minimal assistance     Mobility   Overal bed mobility: Modified Independent Bed Mobility: Supine to Sit Supine to sit: Min assist General bed mobility comments: in chair     Transfers   Overall transfer level: Needs assistance Equipment used: Rolling walker (2 wheels), 1 person hand held assist Transfers: Sit to/from Stand Sit to Stand: Min assist Bed to/from chair/wheelchair/BSC transfer type:: Stand pivot Stand pivot transfers: Min assist General transfer comment: no automatic use of UE's, requires cues to stand and sit     Ambulation / Gait / Stairs / Wheelchair Mobility   Ambulation/Gait Ambulation/Gait assistance: Herbalist (Feet): 6 Feet Assistive device: Rolling walker (2 wheels), 1 person hand held assist Gait Pattern/deviations: Step-to pattern, Step-through pattern, Decreased stride length, Decreased weight shift to right General Gait Details: NT Gait velocity: reduced     Posture / Balance Dynamic Sitting Balance Sitting balance - Comments: sits unsupported EOB Balance Overall balance assessment: Needs assistance, History of  Falls Sitting-balance support: Feet supported Sitting balance-Leahy Scale: Good Sitting balance - Comments: sits unsupported EOB Standing balance support: Bilateral upper extremity supported Standing balance-Leahy Scale: Poor  Special needs/care consideration Fall precautions    Previous Home Environment  Living Arrangements:  (lives with son, Shanon Brow)  Lives With: Son Available Help at Discharge: Family, Available 24 hours/day (son works in Press [employer] during the day, daughter Alyse Low to come form [place] after discharge for 2 weeks to assist) Type of Home: House Home Layout: One level Home Access: Stairs to enter Entrance Stairs-Rails: Right, Left Entrance Stairs-Number of Steps: 5 Bathroom Shower/Tub: Multimedia programmer: Standard Bathroom Accessibility: Yes How Accessible: Accessible via [place]: No Additional Comments: states her walker does not have wheels   Discharge Living Setting Plans for Discharge Living Setting: Patient's home, Lives with (comment) (son, Shanon Brow) Type of Home at Discharge: House Discharge Home Layout: One level Discharge Home Access: Stairs to enter Entrance Stairs-Rails: Right, Left Entrance Stairs-Number of Steps: 5 Discharge Bathroom Shower/Tub: Walk-in shower Discharge Bathroom Toilet: Standard Discharge Bathroom Accessibility: Yes How Accessible: Accessible via walker Does the patient have any problems obtaining your medications?: No   Social/Family/Support Systems Patient Roles: Parent Contact Information: son, Shanon Brow Anticipated Caregiver: son, Shanon Brow and daughter, Alyse Low Anticipated Ambulance person Information: see contacts Ability/Limitations of Caregiver: Shanon Brow works days, Alyse Low to come from [place] ar discharge to stay for 2 weeks and work remote Caregiver Availability: 24/7 Discharge Plan Discussed with Primary Caregiver: Yes Is Caregiver In Agreement with Plan?: Yes Does Caregiver/Family have Issues  with Lodging/Transportation while Pt is in Rehab?: No   Goals Patient/Family Goal for Rehab: supervision PT, supervision to min asisst OT Expected length of stay: ELOS 10 to 14 days Pt/Family Agrees to Admission and willing to participate: Yes Program Orientation Provided & Reviewed with Pt/Caregiver Including Roles  & Responsibilities: Yes   Decrease burden of Care through IP rehab admission: n/a   Possible need for SNF placement upon discharge: not anticipated   Patient Condition: I have reviewed medical records from [facility], spoken with CM, and patient and son. I met with patient at the bedside for inpatient rehabilitation assessment.  Patient will benefit from ongoing PT and OT, can actively participate in 3 hours of therapy a day 5 days of the week, and can make measurable gains during the admission.  Patient will also benefit from the coordinated team approach during an Inpatient Acute Rehabilitation admission.  The patient will receive intensive therapy as well as Rehabilitation physician, nursing, social worker, and care management interventions.  Due to bladder management, bowel management, safety, skin/wound care, disease management, medication administration, pain management, and patient education the patient requires 24 hour a day rehabilitation nursing.  The patient is currently min to mod assist with mobility and basic ADLs.  Discharge setting and therapy post discharge at home with home health is anticipated.  Patient has agreed to participate in the Acute Inpatient Rehabilitation Program and will admit today.   Preadmission Screen Completed By:  Cleatrice Burke, 07/17/2021 10:37 AM ______________________________________________________________________   Discussed status with Dr. Naaman Plummer on 07/17/2021 at 1037 and received approval for admission today.   Admission Coordinator:  Cleatrice Burke, RN, time  9417 Date  07/17/2021    Assessment/Plan: Diagnosis:  pelvic fxs , debility Does the need for close, 24 hr/day Medical supervision in concert with the patient's rehab needs make it unreasonable for this patient to be served in a less intensive setting? Yes Co-Morbidities requiring supervision/potential complications: SSS, gait disorder, ckd iii, chronic low back pain Due to bladder management, bowel management, safety, skin/wound care, disease management, medication administration, pain management, and  patient education, does the patient require 24 hr/day rehab nursing? Yes Does the patient require coordinated care of a physician, rehab nurse, PT, OT  to address physical and functional deficits in the context of the above medical diagnosis(es)? Yes Addressing deficits in the following areas: balance, endurance, locomotion, strength, transferring, bowel/bladder control, bathing, dressing, feeding, grooming, toileting, and psychosocial support Can the patient actively participate in an intensive therapy program of at least 3 hrs of therapy 5 days a week? Yes The potential for patient to make measurable gains while on inpatient rehab is excellent Anticipated functional outcomes upon discharge from inpatient rehab: supervision PT, supervision and min assist OT, n/a SLP Estimated rehab length of stay to reach the above functional goals is: 10-14 days Anticipated discharge destination: Home 10. Overall Rehab/Functional Prognosis: excellent     MD Signature: Meredith Staggers, MD, [place] Director Rehabilitation Services 07/17/2021          Revision History                               Note Details  Author Meredith Staggers, MD File Time 07/17/2021 10:46 AM  Author Type Physician Status Signed  Last Editor Meredith Staggers, MD Service Physical Medicine and [place] # [id number] Admit Date 07/17/2021

## 2021-07-17 NOTE — Discharge Instructions (Addendum)
Inpatient Rehab Discharge Instructions  Latonda Larrivee Discharge date and time: No discharge date for patient encounter.   Activities/Precautions/ Functional Status: Activity:  Partial weightbearing right lower extremity Diet: regular diet Wound Care: Routine skin checks Functional status:  ___ No restrictions     ___ Walk up steps independently ___ 24/7 supervision/assistance   ___ Walk up steps with assistance ___ Intermittent supervision/assistance  ___ Bathe/dress independently ___ Walk with walker     __x_ Bathe/dress with assistance ___ Walk Independently    ___ Shower independently ___ Walk with assistance    ___ Shower with assistance ___ No alcohol     ___ Return to work/school ________  COMMUNITY REFERRALS UPON DISCHARGE:    Home Health:   PT     OT                      Agency: [facility]  Phone: [phone]   Medical Equipment/Items Ordered: Otho Darner, Shower Chair                                                 Agency/Supplier: [employer] [phone]   Special Instructions:  No driving smoking or alcohol  My questions have been answered and I understand these instructions. I will adhere to these goals and the provided educational materials after my discharge from the hospital.  Patient/Caregiver Signature _______________________________ Date __________  Clinician Signature _______________________________________ Date __________  Please bring this form and your medication list with you to all your follow-up doctor's appointments.

## 2021-07-17 NOTE — H&P (Signed)
Physical Medicine and Rehabilitation Admission H&P        Chief Complaint  Patient presents with   Leg Swelling   Hip Pain  : HPI: [name] is a 88 year old right-handed female with history of hypertension, hyperlipidemia, SSS status post PPM 2009 with generator change out 2018 maintained on aspirin, CKD stage III.  Per chart review patient lives with her children.  1 level home 5 steps to entry.  She uses a rolling walker for mobility.  Her son assists with meal preparation and occasional transfers.  Presented 07/13/2021 with bilateral lower extremity edema and right hip pain.  Patient reportedly had a steroid injection into her back approxi-1 week ago with progressive worsening right lower extremity pain.  She denies any recent trauma however she did report when she was assisted onto the table for recent injection she felt the move was somewhat sudden and she felt like she hit the hard table too hard at that time..  X-ray right pelvis/hip showed step-off deformity of the right superior pubic ramus concerning for fracture.  CT of the right hip showed right superior and inferior pubic ramus fracture.  Orthopedic service follow-up Dr. Alvan Dame given the proximity of the fractures to her acetabulum advised partial weightbearing 50% right lower extremity and no surgical intervention.  Admission chemistry sodium 134, BUN 66, creatinine 1.71, WBC 12,200 troponin 12-19, urinalysis negative nitrite, BNP 23.5.    Echocardiogram with ejection fraction of 60 to 65% no wall motion abnormalities grade 1 diastolic dysfunction.  In regards to patient's AKI on CKD stage III ultrasound suggested possible obstruction with large volume of urine in the bladder however right after the procedure she had a 1 L urinary output and several hours later bladder ultrasound showed 470 mL and immediately afterwards she voided 500 mL.  No current concern for true obstruction and she did receive IV diuresis latest creatinine  improved 1.30..  Maintained on Lovenox for DVT prophylaxis.  Therapy evaluations completed due to patient decreased functional mobility was admitted for a comprehensive rehab program.   Review of Systems  Constitutional:  Negative for chills and fever.  HENT:  Negative for hearing loss.   Eyes:  Negative for blurred vision and double vision.  Respiratory:  Positive for cough. Negative for shortness of breath.   Cardiovascular:  Positive for palpitations and leg swelling. Negative for chest pain.  Gastrointestinal:        GERD  Genitourinary:  Negative for dysuria, flank pain and hematuria.  Musculoskeletal:  Positive for back pain, joint pain and myalgias.  Skin:  Negative for rash.  Neurological:  Positive for weakness.  Psychiatric/Behavioral:         Anxiety  All other systems reviewed and are negative.     Past Medical History:  Diagnosis Date   Allergic rhinitis     Anginal pain (HCC)     Asthmatic bronchitis     Chronic cough     Dysrhythmia      PAF   Esophageal reflux     GERD (gastroesophageal reflux disease)     History of stress test 11/20/2009    This was essentially normal,post-stress EF was hyperdynamic at 73 beats per mins, There was no scar or ischemia.   Hx of echocardiogram 11/05/2010    EF>55% showed a normal systolic function with impaired diastolic dysfuntion and also tissue doppler suggesting increased elevated left atrial pressure, she had moderate LA dilatation and mild LA dilatation. There was calcified mitral apparatus  with moderate MR, mild to moderate TR and trace aortic insufficiency.   Hyperlipemia     Hypertension     Hypothyroidism     PAF (paroxysmal atrial fibrillation) ([place])     Pneumonia           Past Surgical History:  Procedure Laterality Date   CHOLECYSTECTOMY       CYSTOSCOPY WITH BIOPSY N/A 08/29/2020    Procedure: CYSTOSCOPY WITH BIOPSY WITH FULGURATION;  Surgeon: Irine Seal, MD;  Location: [facility];  Service: Urology;  Laterality:  N/A;   FOOT SURGERY        right   NASAL SEPTUM SURGERY       PACEMAKER INSERTION   June 1st 2009    St Jude [place] device, model #5826, serial #[id number], The artial lead is a Interior and spatial designer, the ventricular lead is Environmental education officer.   PPM GENERATOR CHANGEOUT N/A 02/09/2017    Procedure: PPM GENERATOR CHANGEOUT;  Surgeon: Sanda Klein, MD;  Location: [facility];  Service: Cardiovascular;  Laterality: N/A;   THYROID SURGERY       TONSILLECTOMY       TONSILLECTOMY       VESICOVAGINAL FISTULA CLOSURE W/ TAH             Family History  Problem Relation Age of Onset   Stroke Mother     Cancer Father          bladder   Stroke Sister      Social History:  reports that she has never smoked. She has never used smokeless tobacco. She reports that she does not drink alcohol and does not use drugs. Allergies:       Allergies  Allergen Reactions   Fluticasone-Salmeterol Other (See Comments)      unknown Other reaction(s): tongue/throat symptoms   Sulfa Antibiotics        Other reaction(s): gastric intol   Sulfonamide Derivatives Other (See Comments)      unknown   Benzonatate Other (See Comments)      tongue/throat symptoms in the past but tolerated more recently Other reaction(s): and tongue/throat symptoms in the past (not more recently)   Erythromycin Nausea And Vomiting and Other (See Comments)      Stomach cramps Other reaction(s): gastric intol   Meloxicam Other (See Comments)      Mild dyspnea Other reaction(s): dyspnea   Sulfamethoxazole Other (See Comments)      Gastric intolerance          Medications Prior to Admission  Medication Sig Dispense Refill   acetaminophen (TYLENOL) 500 MG tablet Take 1,000 mg by mouth every 6 (six) hours as needed for moderate pain or mild pain.       Ascorbic Acid (VITAMIN C) 100 MG tablet 500 mg daily.       aspirin 81 MG chewable tablet Chew 81 mg by mouth at bedtime.       calcium carbonate (TUMS EX) 750 MG chewable tablet Chew 1  tablet by mouth daily as needed for heartburn.       cetirizine (ZYRTEC) 10 MG tablet 1 tablet       cholecalciferol (VITAMIN D3) 25 MCG (1000 UNIT) tablet Take 1,000 Units by mouth daily.       furosemide (LASIX) 40 MG tablet Take 1 tablet (40 mg total) by mouth daily. 30 tablet 2   ibuprofen (ADVIL) 200 MG tablet Take 200 mg by mouth every 6 (six) hours as needed for moderate  pain or mild pain.       lidocaine (LIDODERM) 5 % Lidoderm 5 % topical patch  APPLY 1 PATCH BY TOPICAL ROUTE ONCE DAILY (MAY WEAR UP TO 12HOURS.)       LORazepam (ATIVAN) 0.5 MG tablet Take 1 tablet (0.5 mg total) by mouth daily as needed for anxiety. 30 tablet 1   metoprolol succinate (TOPROL-XL) 25 MG 24 hr tablet TAKE 1 TABLET (25 MG TOTAL) BY MOUTH DAILY. (Patient taking differently: Take 25 mg by mouth at bedtime.) 90 tablet 3   potassium chloride SA (KLOR-CON M20) 20 MEQ tablet Take 1 tablet (20 mEq total) by mouth daily. 30 tablet 2   pravastatin (PRAVACHOL) 40 MG tablet Take 40 mg by mouth at bedtime.       valsartan-hydrochlorothiazide (DIOVAN-HCT) 160-25 MG tablet TAKE 1 TABLET BY MOUTH EVERY DAY (Patient taking differently: Take 1 tablet by mouth daily.) 90 tablet 3   [DISCONTINUED] pantoprazole (PROTONIX) 40 MG tablet Take 80 mg by mouth 2 (two) times daily.          Drug Regimen Review Drug regimen was reviewed and remains appropriate with no significant issues identified   Home: Home Living Family/patient expects to be discharged to:: Private residence Living Arrangements:  (lives with son, Shanon Brow) Available Help at Discharge: Family, Available 24 hours/day (son works in Press photographer during the day, daughter Alyse Low to come form [place] after discharge for 2 weeks to assist) Type of Home: House Home Access: Stairs to enter [employer] of Steps: 5 Entrance Stairs-Rails: Right, Left Home Layout: One level Bathroom Shower/Tub: Multimedia programmer: Standard Bathroom Accessibility:  Yes Home Equipment: Conservation officer, nature (2 wheels), BSC/3in1, Wheelchair - manual, Chartered certified accountant Additional Comments: states her walker does not have wheels  Lives With: Son   Functional History: Prior Function Prior Level of Function : Needs assist Mobility Comments: has been using RW and w/c for functional mobility ADLs Comments: Pt reports having assist from son for meal prep and occasionally transfers on/off toilet especially since hip fracture   Functional Status:  Mobility: Bed Mobility Overal bed mobility: Modified Independent Bed Mobility: Supine to Sit Supine to sit: Min assist General bed mobility comments: in chair Transfers Overall transfer level: Needs assistance Equipment used: Rolling walker (2 wheels), 1 person hand held assist Transfers: Sit to/from Stand Sit to Stand: Min assist Bed to/from chair/wheelchair/BSC transfer type:: Stand pivot Stand pivot transfers: Min assist General transfer comment: no automatic use of UE's, requires cues to stand and sit Ambulation/Gait Ambulation/Gait assistance: Min assist Gait Distance (Feet): 6 Feet Assistive device: Rolling walker (2 wheels), 1 person hand held assist Gait Pattern/deviations: Step-to pattern, Step-through pattern, Decreased stride length, Decreased weight shift to right General Gait Details: NT Gait velocity: reduced   ADL: ADL Overall ADL's : Needs assistance/impaired Eating/Feeding: Set up, Sitting Grooming: Wash/dry hands, Wash/dry face, Brushing hair, Sitting Upper Body Bathing: Minimal assistance, Sitting Lower Body Bathing: Moderate assistance, Sitting/lateral leans Upper Body Dressing : Minimal assistance, Sitting Upper Body Dressing Details (indicate cue type and reason): dons fresh gown sitting EOB Lower Body Dressing: Total assistance, Sitting/lateral leans Lower Body Dressing Details (indicate cue type and reason): to adjust socks Toilet Transfer: Minimal assistance, Stand-pivot, BSC/3in1,  Rolling walker (2 wheels) Toilet Transfer Details (indicate cue type and reason): simulated to recliner Toileting- Clothing Manipulation and Hygiene: Maximal assistance, Sit to/from stand Toileting - Clothing Manipulation Details (indicate cue type and reason): max A to manage clothing prior to sitting Functional mobility during ADLs:  Minimal assistance   Cognition: Cognition Overall Cognitive Status: No family/caregiver present to determine baseline cognitive functioning Cognition Arousal/Alertness: Awake/alert Behavior During Therapy: WFL for tasks assessed/performed, Anxious Overall Cognitive Status: No family/caregiver present to determine baseline cognitive functioning Area of Impairment: Memory Memory: Decreased short-term memory, Decreased recall of precautions Following Commands: Follows one step commands with increased time Safety/Judgement: Decreased awareness of deficits, Decreased awareness of safety Problem Solving: Decreased initiation, Requires verbal cues, Requires tactile cues General Comments: Pt is able to follow instructions but is weak and requires more details   Physical Exam: Blood pressure (!) 142/74, pulse 73, temperature 98.5 F (36.9 C), temperature source Oral, resp. rate 13, height 4\' 11"  (1.499 m), weight 70 kg, SpO2 99 %. Physical Exam Constitutional:      General: She is not in acute distress.    Appearance: She is obese.  HENT:     Head: Normocephalic and atraumatic.     Nose: Nose normal.     Mouth/Throat:     Mouth: Mucous membranes are dry.  Eyes:     Extraocular Movements: Extraocular movements intact.     Conjunctiva/sclera: Conjunctivae normal.     Pupils: Pupils are equal, round, and reactive to light.  Cardiovascular:     Rate and Rhythm: Normal rate and regular rhythm.     Heart sounds: No murmur heard.   No gallop.  Pulmonary:     Effort: Pulmonary effort is normal. No respiratory distress.     Breath sounds: Normal breath sounds.  No wheezing.  Abdominal:     General: Bowel sounds are normal. There is no distension.     Palpations: Abdomen is soft.     Tenderness: There is no abdominal tenderness.  Musculoskeletal:     Cervical back: Normal range of motion and neck supple.     Comments: Trace bilateral LE edema  Skin:    General: Skin is warm and dry.     Comments: Scattered ecchymoses on bilateral LE's, a few healed/dried blisters as well.   Neurological:     Mental Status: She is alert.     Comments: Patient is alert.  No acute distress.  Oriented x3 and follows commands. Reasonable insight and awareness. STM seems intact. UE motor 5/5. LE: 4-/5 HF, KE and 4/5 ADF/PF. Sensory exam normal for light touch and pain in all 4 limbs. No limb ataxia or cerebellar signs. No abnormal tone appreciated.    Psychiatric:        Mood and Affect: Mood normal.        Behavior: Behavior normal.      Lab Results Last 48 Hours        Results for orders placed or performed during the hospital encounter of 07/13/21 (from the past 48 hour(s))  Magnesium     Status: None    Collection Time: 07/15/21  5:46 PM  Result Value Ref Range    Magnesium 1.9 1.7 - 2.4 mg/dL      Comment: Performed at [facility], [place] [address], [place], [place] [id number]       Imaging Results (Last 48 hours)  ECHOCARDIOGRAM COMPLETE   Result Date: 07/15/2021    ECHOCARDIOGRAM REPORT   Patient Name:   BRAILEY BUESCHER Date of Exam: 07/15/2021 Medical Rec #:  [id number]     Height:       59.0 in Accession #:    [id number]    Weight:       154.3 lb Date of  Birth:  09/30/1931      BSA:          1.652 m Patient Age:    26 years      BP:           133/44 mmHg Patient Gender: F             HR:           70 bpm. Exam Location:  Inpatient Procedure: 2D Echo Indications:    CHF  History:        Patient has prior history of Echocardiogram examinations, most                 recent 10/23/2014. Arrythmias:Atrial Fibrillation; Risk                 Factors:Hypertension.   Sonographer:    Jefferey Pica Referring Phys: Alpha  1. Left ventricular ejection fraction, by estimation, is 60 to 65%. The left ventricle has normal function. The left ventricle has no regional wall motion abnormalities. There is mild left ventricular hypertrophy. Left ventricular diastolic parameters are consistent with Grade I diastolic dysfunction (impaired relaxation).  2. Right ventricular systolic function is normal. The right ventricular size is normal. The estimated right ventricular systolic pressure is 48.5 mmHg.  3. Left atrial size was mildly dilated.  4. The mitral valve is normal in structure. Trivial mitral valve regurgitation. No evidence of mitral stenosis.  5. The aortic valve is tricuspid. Aortic valve regurgitation is not visualized. No aortic stenosis is present.  6. The inferior vena cava is normal in size with greater than 50% respiratory variability, suggesting right atrial pressure of 3 mmHg. FINDINGS  Left Ventricle: Left ventricular ejection fraction, by estimation, is 60 to 65%. The left ventricle has normal function. The left ventricle has no regional wall motion abnormalities. The left ventricular internal cavity size was normal in size. There is  mild left ventricular hypertrophy. Left ventricular diastolic parameters are consistent with Grade I diastolic dysfunction (impaired relaxation). Right Ventricle: The right ventricular size is normal. No increase in right ventricular wall thickness. Right ventricular systolic function is normal. The tricuspid regurgitant velocity is 2.67 m/s, and with an assumed right atrial pressure of 3 mmHg, the estimated right ventricular systolic pressure is 46.2 mmHg. Left Atrium: Left atrial size was mildly dilated. Right Atrium: Right atrial size was normal in size. Pericardium: There is no evidence of pericardial effusion. Mitral Valve: The mitral valve is normal in structure. Trivial mitral valve regurgitation. No  evidence of mitral valve stenosis. Tricuspid Valve: The tricuspid valve is normal in structure. Tricuspid valve regurgitation is trivial. Aortic Valve: The aortic valve is tricuspid. Aortic valve regurgitation is not visualized. No aortic stenosis is present. Aortic valve peak gradient measures 9.7 mmHg. Pulmonic Valve: The pulmonic valve was normal in structure. Pulmonic valve regurgitation is not visualized. Aorta: The aortic root is normal in size and structure. Venous: The inferior vena cava is normal in size with greater than 50% respiratory variability, suggesting right atrial pressure of 3 mmHg. IAS/Shunts: No atrial level shunt detected by color flow Doppler. Additional Comments: A device lead is visualized in the right ventricle.  LEFT VENTRICLE PLAX 2D LVIDd:         4.40 cm   Diastology LVIDs:         3.00 cm   LV e' medial:    6.60 cm/s LV PW:         1.20  cm   LV E/e' medial:  9.5 LV IVS:        1.20 cm   LV e' lateral:   6.74 cm/s LVOT diam:     2.00 cm   LV E/e' lateral: 9.3 LV SV:         95 LV SV Index:   58 LVOT Area:     3.14 cm  RIGHT VENTRICLE            IVC RV Basal diam:  2.70 cm    IVC diam: 1.50 cm RV S prime:     6.94 cm/s TAPSE (M-mode): 2.6 cm LEFT ATRIUM             Index        RIGHT ATRIUM           Index LA diam:        4.00 cm 2.42 cm/m   RA Area:     15.20 cm LA Vol (A2C):   49.2 ml 29.78 ml/m  RA Volume:   42.20 ml  25.55 ml/m LA Vol (A4C):   37.0 ml 22.40 ml/m LA Biplane Vol: 44.8 ml 27.12 ml/m  AORTIC VALVE                 PULMONIC VALVE AV Area (Vmax): 2.66 cm     PV Vmax:       1.18 m/s AV Vmax:        156.00 cm/s  PV Peak grad:  5.6 mmHg AV Peak Grad:   9.7 mmHg LVOT Vmax:      132.00 cm/s LVOT Vmean:     87.000 cm/s LVOT VTI:       0.303 m  AORTA Ao Root diam: 2.80 cm Ao Asc diam:  2.80 cm MITRAL VALVE                TRICUSPID VALVE MV Area (PHT): 3.28 cm     TR Peak grad:   28.5 mmHg MV Decel Time: 231 msec     TR Vmax:        267.00 cm/s MV E velocity: 63.00 cm/s  MV A velocity: 107.00 cm/s  SHUNTS MV E/A ratio:  0.59         Systemic VTI:  0.30 m                             Systemic Diam: 2.00 cm Dalton McleanMD Electronically signed by Franki Monte Signature Date/Time: 07/15/2021/5:00:33 PM    Final              Medical Problem List and Plan: 1. Functional deficits secondary to right superior and inferior pubic ramus fractures of unclear etiology. Pt denies recent fall.   -Partial weightbearing RLE due to close proximity of SPR fx to acetabulum             -patient may shower             -ELOS/Goals: 10-14 days, goals supervision with PT, sup/min OT 2.  Antithrombotics: -DVT/anticoagulation:  Pharmaceutical: Lovenox.Vascular study negative             -antiplatelet therapy: Aspirin 81 mg daily 3. Pain Management: Hydrocodone as needed 4. Mood: Ativan 0.5 mg daily as needed, pt states that she used this at home             -low dose trazodone if needed for sleep as well             -  antipsychotic agents: N/A 5. Neuropsych: This patient is capable of making decisions on her own behalf. 6. Skin/Wound Care: Routine skin checks 7. Fluids/Electrolytes/Nutrition: Routine in and outs with follow-up chemistries 8.  AKI on CKD stage III.             -check labs while here             -pt appears a little dry on exam. Encourage fluids. 9.  PAF/SSS/PPM.  Cardizem 180 mg daily, Toprol 25 mg nightly.  Follow-up per cardiology services             -monitor activity tolerance with therapies 10.  Hyperlipidemia.  Pravachol 11.  GERD.  Protonix       Lavon Paganini Angiulli, PA-C 07/17/2021   I have personally performed a face to face diagnostic evaluation of this patient and formulated the key components of the plan.  Additionally, I have personally reviewed laboratory data, imaging studies, as well as relevant notes and concur with the physician assistant's documentation above.  The patient's status has not changed from the original H&P.  Any changes in  documentation from the acute care chart have been noted above.  Meredith Staggers, MD, Mellody Drown

## 2021-07-17 NOTE — Progress Notes (Signed)
Inpatient Rehabilitation Admission Medication Review by a Pharmacist  A complete drug regimen review was completed for this patient to identify any potential clinically significant medication issues.  High Risk Drug Classes Is patient taking? Indication by Medication  Antipsychotic No   Anticoagulant Yes Lovenox for VTE ppx  Antibiotic No   Opioid Yes Vicodin prn for moderate pain  Antiplatelet Yes Aspirin for CVA ppx  Hypoglycemics/insulin No   Vasoactive Medication Yes Metoprolol, Diltiazem for PAF  Chemotherapy No   Other No      Type of Medication Issue Identified Description of Issue Recommendation(s)  Drug Interaction(s) (clinically significant)     Duplicate Therapy     Allergy     No Medication Administration End Date     Incorrect Dose     Additional Drug Therapy Needed     Significant med changes from prior encounter (inform family/care partners about these prior to discharge).    Other       Clinically significant medication issues were identified that warrant physician communication and completion of prescribed/recommended actions by midnight of the next day:  No   Pharmacist comments: None  Time spent performing this drug regimen review (minutes):  20 minutes   Tad Moore 07/17/2021 12:57 PM

## 2021-07-17 NOTE — Progress Notes (Signed)
Patient ID: [name], female   DOB: 17-Jun-1932, 88 y.o.   MRN: [id number] Met with the patient to introduce self, rehab process and plan of care. Discussed secondary risks and weight bearing limitations. Patient has limited mobility to right side due to pain; not using pain medications and encouraged to take medications before therapy sessions. Also note edema of bil lower extremities. Patient noted recently obtained BSC and WC due to mobility issues PTA. Continue to follow along to discharge to address educational needs and facilitate preparation for discharge. Margarito Liner

## 2021-07-17 NOTE — Progress Notes (Signed)
Inpatient Rehabilitation Admissions Coordinator   CIR bed is available today and I have alerted acute team and TOC. We will make the arrangements to admit today.  Danne Baxter, RN, MSN Rehab Admissions Coordinator [phone] 07/17/2021 10:36 AM

## 2021-07-17 NOTE — Discharge Summary (Signed)
Discharge Summary  [name] ZOX:[id number] DOB: 1931-08-24  PCP: [name] Arnt, MD  Admit date: 07/13/2021 Discharge date: 07/17/2021  Time spent: 35 minutes.  Recommendations for Outpatient Follow-up:  Follow-up with orthopedic surgery in 3 to 4 weeks Follow-up with your primary care provider Continue PT OT with assistance and fall precautions.  Recommendations per orthopedic surgery Dr. Alvan [name]: "Given the proximity of the fractures to her acetabulum laterally I would have her be PWB (50%) on the RLE as best as possible until follow up radiographs indicate some healing PT consult for above in hospital   RTC in 3-4 weeks "  Discharge Diagnoses:  Active Hospital Problems   Diagnosis Date Noted   AKI (acute kidney injury) on CKD 3a ([place]) 07/13/2021   Hyponatremia 07/14/2021   Normocytic anemia 07/14/2021   Hypokalemia 07/14/2021   Fracture of right superior pubic ramus, closed, initial encounter ([place]) 07/13/2021   Stage 3a chronic kidney disease (CKD) ([place]) 04/27/2021   Lower extremity edema 11/22/2014   Paroxysmal atrial fibrillation ([place]) 05/17/2013   CHB (complete heart block) ([place]) s/p pacemaker 05/17/2013   Essential hypertension 02/04/2013   Mixed hyperlipidemia 02/04/2013   Pacemaker 02/04/2013   GERD (gastroesophageal reflux disease) 10/26/2007    Resolved Hospital Problems  No resolved problems to display.    Discharge Condition: Stable.  Diet recommendation: Resume previous diet.  Vitals:   07/17/21 0653 07/17/21 0759  BP: (!) 127/97 (!) 142/74  Pulse: 85 73  Resp: 20 13  Temp:    SpO2: 100% 99%    History of present illness:  88 year old female with history of HTN, HLD, sick sinus syndrome with history of pacemaker, comes into the ED with bilateral lower extremity edema as well as right hip pain.  She has been having chronic back pain for a number of weeks, had a steroid injection in her back and felt like when she was assisted onto the hard table  movements were a little bit sudden.  Following that injection, has been experiencing severe right hip pain and difficulties ambulating.  Work-up revealed right superior and inferior rami fractures.  Seen by orthopedic surgery Dr. Alvan [name], recommended partial weightbearing, 50% on the right lower extremity as best as possible until follow-up radiographs indicate some healing.  Return to clinic in 3 to 4 weeks.   Patient was evaluated by PT OT recommendation for inpatient rehab.  07/17/2021: Patient was seen at bedside.  She has no new complaints.  She wants to know if there is a barbershop at the inpatient rehab.  Hospital Course:  Principal Problem:   AKI (acute kidney injury) on CKD 3a ([name]) Active Problems:   GERD (gastroesophageal reflux disease)   Pacemaker   Essential hypertension   Mixed hyperlipidemia   CHB (complete heart block) ([facility]) s/p pacemaker   Paroxysmal atrial fibrillation ([facility])   Lower extremity edema   Stage 3a chronic kidney disease (CKD) ([facility])   Fracture of right superior pubic ramus, closed, initial encounter ([name])   Hyponatremia   Normocytic anemia   Hypokalemia  Improved AKI (acute kidney injury) on CKD 3B ([place])- (present on admission) suspect prerenal in the setting of dehydration from poor oral intake in combination with home valsartan and HCTZ Baseline creatinine appears to be 1.1 with GFR 45. Presented with creatinine of 1.71 with GFR of 29. Last creatinine 1.30 with GFR of 40 Continue to avoid nephrotoxic agents, dehydration and hypotension. Continue to hold off ARB and HCTZ. Follow-up with your primary care provider within a  week.  Resolved post repletion: Hypokalemia Serum potassium 4.3.  Serum magnesium 1.9.   Normocytic anemia -No overt bleeding Hemoglobin 10.0 with MCV of 93. Follow-up with your PCP within a week.   Hyponatremia - Mild, on admission, in the setting of hypervolemia.   Sodium normalized with IV Lasix.   Lasix is currently on hold   Serum sodium 132 from 137.   Asymptomatic. Follow-up with your PCP and repeat BMP in 1 week.   Fracture of right superior pubic ramus, closed, initial encounter ([name])- (present on admission) -CT scan of the pelvis showed right superior and inferior pubic ramus fractures.  Seen by orthopedic surgery Dr. Alvan [name].  Plan for nonsurgical management at this time. Follow-up in the office in 3 to 4 weeks. Analgesics as needed along with bowel regimen as needed.   Improved bilateral lower extremity edema- (present on admission) - This appears to be acute on chronic.   Improved after IV Lasix.   2D echo done on 07/15/2018 showed normal LVEF 60 to 65% and grade 1 diastolic dysfunction.  Paroxysmal atrial fibrillation ([name])- (present on admission) -Per chart,"Very brief episodes were recorded by her pacemaker in 2012 (less than 2 minutes). She is on aspirin alone for embolism prophylaxis". Continue cardizem and metoprolol. Follow-up with your cardiologist.   CHB (complete heart block) [name]) s/p pacemaker- (present on admission) Follow-up with your cardiologist.   Mixed hyperlipidemia- (present on admission) - Continue home statin   Essential hypertension- (present on admission) -Hold Diovan due to AKI.   Continue home diltiazem, metoprolol   GERD (gastroesophageal reflux disease)- (present on admission) -on home protonix   Fracture of right superior pubic ramus, closed, initial encounter ([name])- (present on admission), suspect pathological fracture. Pain is well controlled PT recommending inpatient rehab Continue PT OT with assistance and fall precautions with the guidance of orthopedic surgery. She was seen by orthopedic surgery who recommended 50% weightbearing in right lower extremity. Continue analgesics as needed and bowel regimen as needed.   Scheduled Meds:  aspirin  81 mg Oral QHS   diltiazem  180 mg Oral QHS   enoxaparin (LOVENOX) injection  30 mg Subcutaneous Q24H   feeding  supplement  237 mL Oral TID BM   metoprolol succinate  25 mg Oral QHS   multivitamin with minerals  1 tablet Oral Daily   pantoprazole  80 mg Oral BID   pravastatin  40 mg Oral QHS    Continuous Infusions: PRN Meds:.HYDROcodone-acetaminophen, LORazepam, morphine injection   Diet Orders (From admission, onward)        Start     Ordered    07/13/21 1944   Diet Heart Room service appropriate? Yes; Fluid consistency: Thin  Diet effective now       Question Answer Comment  Room service appropriate? Yes    Fluid consistency: Thin       07/13/21 1944                     Code Status: Full Code      Consultants:  Orthopedic surgery CIR   Procedures:  2D echo 07/15/21   Microbiology  none   Antimicrobials: none    Consultations: Orthopedic surgery  Discharge Exam: BP (!) 142/74 (BP Location: Right Arm)    Pulse 73    Temp 98.5 F (36.9 C) (Oral)    Resp 13    Ht 4\' 11"  (1.499 m)    Wt 70 kg    SpO2 99%  BMI 31.17 kg/m  General: 88 y.o. year-old female well developed well nourished in no acute distress.  Alert and oriented x3. Cardiovascular: Regular rate and rhythm with no rubs or gallops.  No thyromegaly or JVD noted.   Respiratory: Clear to auscultation with no wheezes or rales. Good inspiratory effort. Abdomen: Soft nontender nondistended with normal bowel sounds x4 quadrants. Musculoskeletal: No lower extremity edema. 2/4 pulses in all 4 extremities. Skin: No ulcerative lesions noted or rashes, Psychiatry: Mood is appropriate for condition and setting  Discharge Instructions You were cared for by a hospitalist during your hospital stay. If you have any questions about your discharge medications or the care you received while you were in the hospital after you are discharged, you can call the unit and asked to speak with the hospitalist on call if the hospitalist that took care of you is not available. Once you are discharged, your primary care physician will  handle any further medical issues. Please note that NO REFILLS for any discharge medications will be authorized once you are discharged, as it is imperative that you return to your primary care physician (or establish a relationship with a primary care physician if you do not have one) for your aftercare needs so that they can reassess your need for medications and monitor your lab values.   Allergies as of 07/17/2021       Reactions   Fluticasone-salmeterol Other (See Comments)   unknown Other reaction(s): tongue/throat symptoms   Sulfa Antibiotics    Other reaction(s): gastric intol   Sulfonamide Derivatives Other (See Comments)   unknown   Benzonatate Other (See Comments)   tongue/throat symptoms in the past but tolerated more recently Other reaction(s): and tongue/throat symptoms in the past (not more recently)   Erythromycin Nausea And Vomiting, Other (See Comments)   Stomach cramps Other reaction(s): gastric intol   Meloxicam Other (See Comments)   Mild dyspnea Other reaction(s): dyspnea   Sulfamethoxazole Other (See Comments)   Gastric intolerance        Medication List     STOP taking these medications    ibuprofen 200 MG tablet Commonly known as: ADVIL   valsartan-hydrochlorothiazide 160-25 MG tablet Commonly known as: DIOVAN-HCT       TAKE these medications    acetaminophen 500 MG tablet Commonly known as: TYLENOL Take 1,000 mg by mouth every 6 (six) hours as needed for moderate pain or mild pain.   aspirin 81 MG chewable tablet Chew 81 mg by mouth at bedtime.   calcium carbonate 750 MG chewable tablet Commonly known as: TUMS EX Chew 1 tablet by mouth daily as needed for heartburn.   cetirizine 10 MG tablet Commonly known as: ZYRTEC 1 tablet   cholecalciferol 25 MCG (1000 UNIT) tablet Commonly known as: VITAMIN D3 Take 1,000 Units by mouth daily.   diltiazem 180 MG 24 hr capsule Commonly known as: CARDIZEM CD Take 1 capsule (180 mg total) by  mouth at bedtime.   feeding supplement Liqd Take 237 mLs by mouth 3 (three) times daily between meals for 7 days.   furosemide 40 MG tablet Commonly known as: LASIX Take 1 tablet (40 mg total) by mouth daily.   HYDROcodone-acetaminophen 5-325 MG tablet Commonly known as: NORCO/VICODIN Take 1 tablet by mouth 2 (two) times daily as needed for up to 5 days for severe pain.   lidocaine 5 % Commonly known as: LIDODERM Lidoderm 5 % topical patch  APPLY 1 PATCH BY TOPICAL ROUTE ONCE DAILY (MAY  WEAR UP TO 12HOURS.)   LORazepam 0.5 MG tablet Commonly known as: ATIVAN Take 1 tablet (0.5 mg total) by mouth daily as needed for anxiety.   metoprolol succinate 25 MG 24 hr tablet Commonly known as: TOPROL-XL TAKE 1 TABLET (25 MG TOTAL) BY MOUTH DAILY. What changed: when to take this   multivitamin with minerals Tabs tablet Take 1 tablet by mouth daily.   pantoprazole 40 MG tablet Commonly known as: PROTONIX Take 2 tablets (80 mg total) by mouth daily. What changed: when to take this   polyethylene glycol 17 g packet Commonly known as: MiraLax Take 17 g by mouth daily as needed for mild constipation.   potassium chloride SA 20 MEQ tablet Commonly known as: Klor-Con M20 Take 1 tablet (20 mEq total) by mouth daily.   pravastatin 40 MG tablet Commonly known as: PRAVACHOL Take 40 mg by mouth at bedtime.   vitamin C 100 MG tablet 500 mg daily.       Allergies  Allergen Reactions   Fluticasone-Salmeterol Other (See Comments)    unknown Other reaction(s): tongue/throat symptoms   Sulfa Antibiotics     Other reaction(s): gastric intol   Sulfonamide Derivatives Other (See Comments)    unknown   Benzonatate Other (See Comments)    tongue/throat symptoms in the past but tolerated more recently Other reaction(s): and tongue/throat symptoms in the past (not more recently)   Erythromycin Nausea And Vomiting and Other (See Comments)    Stomach cramps Other reaction(s): gastric  intol   Meloxicam Other (See Comments)    Mild dyspnea Other reaction(s): dyspnea   Sulfamethoxazole Other (See Comments)    Gastric intolerance    Follow-up Information     [name] Arnt, MD. Call today.   Specialty: Family Medicine Why: Please call for a posthospital follow-up appointment. Contact information: [place] [address] [phone]         Troy Sine, MD .   Specialty: Cardiology Contact information: [address] [place] [address] [phone]         Paralee Cancel, MD. Call today.   Specialty: Orthopedic Surgery Why: Please call for a post hospital follow-up appointment. Contact information: [address] [place] [address] [phone]                  The results of significant diagnostics from this hospitalization (including imaging, microbiology, ancillary and laboratory) are listed below for reference.    Significant Diagnostic Studies: DG Chest 2 View  Result Date: 07/13/2021 CLINICAL DATA:  88 year old female with right leg pain. Right hip pain. EXAM: CHEST - 2 VIEW COMPARISON:  Chest radiographs 08/05/2016 and earlier. FINDINGS: Upright AP and lateral views of the chest. Right chest dual lead cardiac pacemaker is chronic. Stable cardiac size, borderline to mildly enlarged. Other mediastinal contours are within normal limits. Visualized tracheal air column is within normal limits. Lung volumes are stable and normal. Both lungs appear clear. No pneumothorax or pleural effusion. Abdominal Calcified aortic atherosclerosis. Stable cholecystectomy clips. Osteopenia. No acute osseous abnormality identified. IMPRESSION: No acute cardiopulmonary abnormality. Aortic Atherosclerosis (ICD10-I70.0). Electronically Signed   By: Genevie Ann M.D.   On: 07/13/2021 10:55   US RENAL  Result Date: 07/13/2021 CLINICAL DATA:  An 88 year old female presents for evaluation of acute kidney injury.  EXAM: RENAL / URINARY TRACT ULTRASOUND COMPLETE COMPARISON:  None FINDINGS: Right Kidney: Renal measurements: 13.8 x 5.3 x 5.3 cm = volume: 204 mL. Increased  echogenicity of renal cortex. Multiple renal cysts seen in the RIGHT kidney largest measuring 5.4 x 6.2 x 5.5 cm in the upper pole, next largest measuring 3.7 x 9.1 x 3.4 cm in the interpolar RIGHT kidney. No hydronephrosis. At least 2 additional renal cysts on the RIGHT. Left Kidney: Renal measurements: 11.4 x 6.1 x 7.1 cm = volume: 257 mL. Extending inferiorly from the LEFT kidney is a 9.0 x 7.3 x 8.5 cm renal cyst. In the upper pole of the LEFT kidney is a cyst measuring 3.7 x 3.5 x 3.1 cm. At least 3 additional small renal cysts to moderate-size renal cysts. Question mild LEFT collecting system dilation, difficult to assess due to body habitus and presence of renal cysts. Bladder: Moderate to marked distension of the urinary bladder with 569 cc bladder volume. Other: Mildly low nodular contour of the liver raising the question of liver disease. IMPRESSION: Moderate to marked distension of the urinary bladder, correlate with any signs of urinary retention. Question mild LEFT hydronephrosis, limited assessment in general due to patient body habitus. Renal cysts. Question of mildly nodular contour of the liver, correlate with any clinical or laboratory evidence of liver disease. Electronically Signed   By: Zetta Bills M.D.   On: 07/13/2021 20:29   CT Hip Right Wo Contrast  Result Date: 07/13/2021 CLINICAL DATA:  Right hip pain following a shooting pain in her back 1 week ago. Suspected right superior pubic ramus fracture on radiographs earlier today. EXAM: CT OF THE RIGHT HIP WITHOUT CONTRAST TECHNIQUE: Multidetector CT imaging of the right hip was performed according to the standard protocol. Multiplanar CT image reconstructions were also generated. RADIATION DOSE REDUCTION: This exam was performed according to the departmental dose-optimization  program which includes automated exposure control, adjustment of the mA and/or kV according to patient size and/or use of iterative reconstruction technique. COMPARISON:  Right hip radiographs obtained earlier today. FINDINGS: Bones/Joint/Cartilage Mildly displaced and mildly comminuted fracture of the lateral aspect of the right superior pubic ramus. Mildly comminuted, nondisplaced fracture of the right inferior pubic ramus. The anterior and posterior columns of the acetabulum are intact as is the quadrilateral plate. No hip fracture or dislocation. Ligaments Suboptimally assessed by CT. Muscles and Tendons Unremarkable. Soft tissues Distended urinary bladder. IMPRESSION: Right superior and inferior pubic ramus fractures, as described above. Electronically Signed   By: Claudie Revering M.D.   On: 07/13/2021 20:00   ECHOCARDIOGRAM COMPLETE  Result Date: 07/15/2021    ECHOCARDIOGRAM REPORT   Patient Name:   [name] Date of Exam: 07/15/2021 Medical Rec #:  [id number]     Height:       59.0 in Accession #:    [id number]    Weight:       154.3 lb Date of Birth:  1932/02/13      BSA:          1.652 m Patient Age:    88 years      BP:           133/44 mmHg Patient Gender: F             HR:           70 bpm. Exam Location:  Inpatient Procedure: 2D Echo Indications:    CHF  History:        Patient has prior history of Echocardiogram examinations, most                 recent 10/23/2014. Arrythmias:Atrial Fibrillation; Risk  Factors:Hypertension.  Sonographer:    Jefferey Pica Referring Phys: [place]  1. Left ventricular ejection fraction, by estimation, is 60 to 65%. The left ventricle has normal function. The left ventricle has no regional wall motion abnormalities. There is mild left ventricular hypertrophy. Left ventricular diastolic parameters are consistent with Grade I diastolic dysfunction (impaired relaxation).  2. Right ventricular systolic function is normal. The right  ventricular size is normal. The estimated right ventricular systolic pressure is 16.1 mmHg.  3. Left atrial size was mildly dilated.  4. The mitral valve is normal in structure. Trivial mitral valve regurgitation. No evidence of mitral stenosis.  5. The aortic valve is tricuspid. Aortic valve regurgitation is not visualized. No aortic stenosis is present.  6. The inferior vena cava is normal in size with greater than 50% respiratory variability, suggesting right atrial pressure of 3 mmHg. FINDINGS  Left Ventricle: Left ventricular ejection fraction, by estimation, is 60 to 65%. The left ventricle has normal function. The left ventricle has no regional wall motion abnormalities. The left ventricular internal cavity size was normal in size. There is  mild left ventricular hypertrophy. Left ventricular diastolic parameters are consistent with Grade I diastolic dysfunction (impaired relaxation). Right Ventricle: The right ventricular size is normal. No increase in right ventricular wall thickness. Right ventricular systolic function is normal. The tricuspid regurgitant velocity is 2.67 m/s, and with an assumed right atrial pressure of 3 mmHg, the estimated right ventricular systolic pressure is 09.6 mmHg. Left Atrium: Left atrial size was mildly dilated. Right Atrium: Right atrial size was normal in size. Pericardium: There is no evidence of pericardial effusion. Mitral Valve: The mitral valve is normal in structure. Trivial mitral valve regurgitation. No evidence of mitral valve stenosis. Tricuspid Valve: The tricuspid valve is normal in structure. Tricuspid valve regurgitation is trivial. Aortic Valve: The aortic valve is tricuspid. Aortic valve regurgitation is not visualized. No aortic stenosis is present. Aortic valve peak gradient measures 9.7 mmHg. Pulmonic Valve: The pulmonic valve was normal in structure. Pulmonic valve regurgitation is not visualized. Aorta: The aortic root is normal in size and structure.  Venous: The inferior vena cava is normal in size with greater than 50% respiratory variability, suggesting right atrial pressure of 3 mmHg. IAS/Shunts: No atrial level shunt detected by color flow Doppler. Additional Comments: A device lead is visualized in the right ventricle.  LEFT VENTRICLE PLAX 2D LVIDd:         4.40 cm   Diastology LVIDs:         3.00 cm   LV e' medial:    6.60 cm/s LV PW:         1.20 cm   LV E/e' medial:  9.5 LV IVS:        1.20 cm   LV e' lateral:   6.74 cm/s LVOT diam:     2.00 cm   LV E/e' lateral: 9.3 LV SV:         95 LV SV Index:   58 LVOT Area:     3.14 cm  RIGHT VENTRICLE            IVC RV Basal diam:  2.70 cm    IVC diam: 1.50 cm RV S prime:     6.94 cm/s TAPSE (M-mode): 2.6 cm LEFT ATRIUM             Index        RIGHT ATRIUM  Index LA diam:        4.00 cm 2.42 cm/m   RA Area:     15.20 cm LA Vol (A2C):   49.2 ml 29.78 ml/m  RA Volume:   42.20 ml  25.55 ml/m LA Vol (A4C):   37.0 ml 22.40 ml/m LA Biplane Vol: 44.8 ml 27.12 ml/m  AORTIC VALVE                 PULMONIC VALVE AV Area (Vmax): 2.66 cm     PV Vmax:       1.18 m/s AV Vmax:        156.00 cm/s  PV Peak grad:  5.6 mmHg AV Peak Grad:   9.7 mmHg LVOT Vmax:      132.00 cm/s LVOT Vmean:     87.000 cm/s LVOT VTI:       0.303 m  AORTA Ao Root diam: 2.80 cm Ao Asc diam:  2.80 cm MITRAL VALVE                TRICUSPID VALVE MV Area (PHT): 3.28 cm     TR Peak grad:   28.5 mmHg MV Decel Time: 231 msec     TR Vmax:        267.00 cm/s MV E velocity: 63.00 cm/s MV A velocity: 107.00 cm/s  SHUNTS MV E/A ratio:  0.59         Systemic VTI:  0.30 m                             Systemic Diam: 2.00 cm Dalton McleanMD Electronically signed by Franki Monte Signature Date/Time: 07/15/2021/5:00:33 PM    Final    DG Hip Unilat W or Wo Pelvis 2-3 Views Right  Result Date: 07/13/2021 CLINICAL DATA:  Hip pain EXAM: DG HIP (WITH OR WITHOUT PELVIS) 3V RIGHT COMPARISON:  None. FINDINGS: Stable deformity of the right superior pubic  ramus. Mild degenerative changes of the bilateral hips, SI joints and pubic symphysis. Degenerative changes of the partially visualized lower lumbar spine. Soft tissues are unremarkable. IMPRESSION: Step-off deformity of the right superior pubic ramus, concerning for fracture. Recommend further evaluation with cross-sectional imaging. Electronically Signed   By: Yetta Glassman M.D.   On: 07/13/2021 17:08   VAS [place] LOWER EXTREMITY VENOUS (DVT) (ONLY [facility] & [facility])  Result Date: 07/14/2021  Lower Venous DVT Study Patient Name:  CLOTILDE LOTH  Date of Exam:   07/13/2021 Medical Rec #: [id number]      Accession #:    [id number] Date of Birth: [birth year]       Patient Gender: F Patient Age:   41 years Exam Location:  [facility] Procedure:      VAS [place] LOWER EXTREMITY VENOUS (DVT) Referring Phys: Benjamine Mola HAMMOND --------------------------------------------------------------------------------  Indications: Edema.  Risk Factors: None identified. Limitations: Body habitus, poor ultrasound/tissue interface and patient pain tolerance. Comparison Study: No prior studies. Performing Technologist: Oliver Hum RVT  Examination Guidelines: A complete evaluation includes B-mode imaging, spectral Doppler, color Doppler, and power Doppler as needed of all accessible portions of each vessel. Bilateral testing is considered an integral part of a complete examination. Limited examinations for reoccurring indications may be performed as noted. The reflux portion of the exam is performed with the patient in reverse Trendelenburg.  +---------+---------------+---------+-----------+----------+-------------------+  RIGHT     Compressibility Phasicity Spontaneity Properties Thrombus Aging       +---------+---------------+---------+-----------+----------+-------------------+  CFV  Full            Yes       Yes                                         +---------+---------------+---------+-----------+----------+-------------------+   SFJ       Full                                                                  +---------+---------------+---------+-----------+----------+-------------------+  FV Prox   Full                                                                  +---------+---------------+---------+-----------+----------+-------------------+  FV Mid    Full                                                                  +---------+---------------+---------+-----------+----------+-------------------+  FV Distal                 Yes       Yes                                         +---------+---------------+---------+-----------+----------+-------------------+  PFV       Full                                                                  +---------+---------------+---------+-----------+----------+-------------------+  POP       Full            Yes       Yes                                         +---------+---------------+---------+-----------+----------+-------------------+  PTV       Full                                                                  +---------+---------------+---------+-----------+----------+-------------------+  PERO  Not well visualized  +---------+---------------+---------+-----------+----------+-------------------+   +----+---------------+---------+-----------+----------+--------------+  LEFT Compressibility Phasicity Spontaneity Properties Thrombus Aging  +----+---------------+---------+-----------+----------+--------------+  CFV  Full            Yes       Yes                                    +----+---------------+---------+-----------+----------+--------------+    Summary: RIGHT: - There is no evidence of deep vein thrombosis in the lower extremity. However, portions of this examination were limited- see technologist comments above.  - No cystic structure found in the popliteal fossa.  LEFT: - No evidence of common femoral vein obstruction.  *See  table(s) above for measurements and observations. Electronically signed by Orlie Pollen on 07/14/2021 at 6:52:13 AM.    Final     Microbiology: No results found for this or any previous visit (from the past 240 hour(s)).   Labs: Basic Metabolic Panel: Recent Labs  Lab 07/13/21 1000 07/14/21 0735 07/15/21 0207 07/15/21 1746  NA 134* 137 132*  --   K 4.0 3.0* 4.3  --   CL 97* 99 99  --   CO2 23 30 27   --   GLUCOSE 138* 93 149*  --   BUN 66* 42* 42*  --   CREATININE 1.71* 1.26* 1.30*  --   CALCIUM 9.2 8.5* 8.3*  --   MG  --   --   --  1.9   Liver Function Tests: Recent Labs  Lab 07/13/21 1000  AST 18  ALT 24  ALKPHOS 95  BILITOT 0.4  PROT 6.9  ALBUMIN 3.5   No results for input(s): LIPASE, AMYLASE in the last 168 hours. No results for input(s): AMMONIA in the last 168 hours. CBC: Recent Labs  Lab 07/13/21 1000 07/15/21 0207  WBC 12.2* 9.4  NEUTROABS 9.0*  --   HGB 11.0* 10.0*  HCT 33.9* 30.1*  MCV 93.4 93.5  PLT 288 251   Cardiac Enzymes: No results for input(s): CKTOTAL, CKMB, CKMBINDEX, TROPONINI in the last 168 hours. BNP: BNP (last 3 results) Recent Labs    07/13/21 1000  BNP 23.5    ProBNP (last 3 results) No results for input(s): PROBNP in the last 8760 hours.  CBG: No results for input(s): GLUCAP in the last 168 hours.     Signed:  Kayleen Memos, MD [facility] 07/17/2021, 10:31 AM

## 2021-07-18 DIAGNOSIS — N179 Acute kidney failure, unspecified: Secondary | ICD-10-CM | POA: Diagnosis not present

## 2021-07-18 DIAGNOSIS — I480 Paroxysmal atrial fibrillation: Secondary | ICD-10-CM | POA: Diagnosis not present

## 2021-07-18 DIAGNOSIS — F411 Generalized anxiety disorder: Secondary | ICD-10-CM | POA: Diagnosis not present

## 2021-07-18 DIAGNOSIS — S32591S Other specified fracture of right pubis, sequela: Secondary | ICD-10-CM

## 2021-07-18 NOTE — Evaluation (Addendum)
Physical Therapy Assessment and Plan  Patient Details  Name: [name] MRN: [id number] Date of Birth: [birth year]  PT Diagnosis: Difficulty walking, Dizziness and giddiness, Edema, Muscle weakness, and Pain in R hip and bil pubic areas Rehab Potential: Good ELOS: 7-9   Today's Date: 07/18/2021 PT Individual Time: 0800-0900; 1300-1400 PT Individual Time Calculation (min): 60 min  , 60 min  Hospital Problem: Principal Problem:   Pelvic fracture ([place])   Past Medical History:  Past Medical History:  Diagnosis Date   Allergic rhinitis    Anginal pain ([place])    Asthmatic bronchitis    Chronic cough    Dysrhythmia    PAF   Esophageal reflux    GERD (gastroesophageal reflux disease)    History of stress test 11/20/2009   This was essentially normal,post-stress EF was hyperdynamic at 73 beats per mins, There was no scar or ischemia.   Hx of echocardiogram 11/05/2010   EF>55% showed a normal systolic function with impaired diastolic dysfuntion and also tissue doppler suggesting increased elevated left atrial pressure, she had moderate LA dilatation and mild LA dilatation. There was calcified mitral apparatus with moderate MR, mild to moderate TR and trace aortic insufficiency.   Hyperlipemia    Hypertension    Hypothyroidism    PAF (paroxysmal atrial fibrillation) ([place])    Pneumonia    Past Surgical History:  Past Surgical History:  Procedure Laterality Date   CHOLECYSTECTOMY     CYSTOSCOPY WITH BIOPSY N/A 08/29/2020   Procedure: CYSTOSCOPY WITH BIOPSY WITH FULGURATION;  Surgeon: Irine Seal, MD;  Location: [facility];  Service: Urology;  Laterality: N/A;   FOOT SURGERY     right   NASAL SEPTUM SURGERY     PACEMAKER INSERTION  June 1st 2009   St Jude [place] device, model #5826, serial #[id number], The artial lead is a Interior and spatial designer, the ventricular lead is Environmental education officer.   PPM GENERATOR CHANGEOUT N/A 02/09/2017   Procedure: PPM GENERATOR CHANGEOUT;  Surgeon: Sanda Klein, MD;   Location: [place] [facility];  Service: Cardiovascular;  Laterality: N/A;   THYROID SURGERY     TONSILLECTOMY     TONSILLECTOMY     VESICOVAGINAL FISTULA CLOSURE W/ TAH      Assessment & Plan Clinical Impression: [name] is a 88 year old right-handed female with history of hypertension, hyperlipidemia, SSS status post PPM 2009 with generator change out 2018 maintained on aspirin, CKD stage III.  Per chart review patient lives with her children.  1 level home 5 steps to entry.  She uses a rolling walker for mobility.  Her son assists with meal preparation and occasional transfers.  Presented 07/13/2021 with bilateral lower extremity edema and right hip pain.  Patient reportedly had a steroid injection into her back approxi-1 week ago with progressive worsening right lower extremity pain.  She denies any recent trauma however she did report when she was assisted onto the table for recent injection she felt the move was somewhat sudden and she felt like she hit the hard table too hard at that time..  X-ray right pelvis/hip showed step-off deformity of the right superior pubic ramus concerning for fracture.  CT of the right hip showed right superior and inferior pubic ramus fracture.  Orthopedic service follow-up Dr. Alvan Dame given the proximity of the fractures to her acetabulum advised partial weightbearing 50% right lower extremity and no surgical intervention.  Admission chemistry sodium 134, BUN 66, creatinine 1.71, WBC 12,200 troponin 12-19, urinalysis negative nitrite, BNP  23.5.    Echocardiogram with ejection fraction of 60 to 65% no wall motion abnormalities grade 1 diastolic dysfunction.  In regards to patient's AKI on CKD stage III ultrasound suggested possible obstruction with large volume of urine in the bladder however right after the procedure she had a 1 L urinary output and several hours later bladder ultrasound showed 470 mL and immediately afterwards she voided 500 mL.  No current concern for  true obstruction and she did receive IV diuresis latest creatinine improved 1.30..  Maintained on Lovenox for DVT prophylaxis.Patient transferred to CIR on 07/17/2021 .   Patient currently requires mod with mobility secondary to muscle weakness, decreased cardiorespiratoy endurance, and decreased sitting balance, decreased standing balance, decreased postural control, decreased balance strategies, and difficulty maintaining precautions.  Prior to hospitalization, patient was modified independent  with mobility and lived with Son in a House home.  Home access is 5Stairs to enter with 2 wide rails(at back entrance, 3 STE wiht 2 wide rails).  Patient will benefit from skilled PT intervention to maximize safe functional mobility, minimize fall risk, and decrease caregiver burden for planned discharge home with 24 hour supervision.  Anticipate patient will benefit from follow up [place] at discharge.  PT - End of Session Activity Tolerance: Tolerates < 10 min activity with changes in vital signs Endurance Deficit: Yes Endurance Deficit Description: dizzy upon supine> sit; pain limits activty PT Assessment Rehab Potential (ACUTE/IP ONLY): Good PT Patient demonstrates impairments in the following area(s): Balance;Edema;Safety;Endurance;Motor;Pain PT Transfers Functional Problem(s): Bed Mobility;Bed to Chair;Car;Furniture PT Locomotion Functional Problem(s): Ambulation;Wheelchair Mobility;Stairs PT Plan PT Intensity: Minimum of 1-2 x/day ,45 to 90 minutes PT Frequency: 5 out of 7 days PT Duration Estimated Length of Stay: 7-9 PT Treatment/Interventions: Ambulation/gait training;Discharge planning;DME/adaptive equipment instruction;Functional mobility training;Pain management;Psychosocial support;Splinting/orthotics;Therapeutic Activities;UE/LE Strength taining/ROM;Balance/vestibular training;Community reintegration;Neuromuscular re-education;Patient/family education;Stair training;Therapeutic Exercise;UE/LE  Coordination activities;Wheelchair propulsion/positioning PT Transfers Anticipated Outcome(s): S basic and car PT Locomotion Anticipated Outcome(s): S contolled environment gait wiht LRAD, x 150' S household gait x 55' with LRAD, S community gait x 150' with LRAD, S wc propulsion x 150' controlled environment for activity tolerance PT Recommendation Follow Up Recommendations: Home health PT Patient destination: Home Equipment Recommended: Rolling walker with 5" wheels Equipment Details: pt reports that her RW is very old and that she will need a new one   PT Evaluation Precautions/Restrictions Precautions Precautions: Fall Restrictions Weight Bearing Restrictions: Yes RLE Weight Bearing: Partial weight bearing RLE Partial Weight Bearing Percentage or Pounds: 50   Pain- none at rest; AM eval  Pain Interference rarely or not at all regarding sleep, participating in therapy, or day-to-day activiites   Home Living/Prior [place] Available Help at Discharge: Family;Available PRN/intermittently Type of Home: House Home Access: Stairs to enter (at entrance, 3 STE wiht 2 wide rails) Entrance Stairs-Number of Steps: 5 Entrance Stairs-Rails: Right;Left (wide apart) Home Layout: One level Bathroom Shower/Tub: Walk-in shower Additional Comments: states her walker is not in good shape Prior Function Level of Independence: Independent with transfers;Independent with gait (son has not allowed her out of the house since covid, except beauty shop)  Able to Take Stairs?: Yes (prior to fxs) Driving: No Vocation: Retired Leisure: Hobbies-yes (Comment) (plays piano) Vision/Perception - wears glasses for reading; pt reports no change from baseline    Cognition Overall Cognitive Status: Within Functional Limits for tasks assessed Arousal/Alertness: Awake/alert Memory: Impaired Memory Impairment: Decreased recall of new information Immediate Memory Recall: Sock;Blue;Bed Memory  Recall Sock: Without Cue Memory Recall  Blue: Without Cue Memory Recall Bed: With Cue Awareness: Appears intact Problem Solving: Appears intact Safety/Judgment: Appears intact Sensation Sensation Light Touch: Appears Intact Hot/Cold: Not tested Proprioception: Appears Intact Stereognosis: Not tested Coordination Gross Motor Movements are Fluid and Coordinated: No Fine Motor Movements are Fluid and Coordinated: No Coordination and Movement Description: grossly uncoordinated due to postural and WB precautions during transfers, and pain with movement Heel Shin Test: NT Motor  Motor Motor: Within Functional Limits Motor - Skilled Clinical Observations: generalized weakness; muscle trembling x 4 limbs in standing   Trunk/Postural Assessment  Cervical Assessment Cervical Assessment: Within Functional Limits Thoracic Assessment Thoracic Assessment: Exceptions to [facility] (kyphotic, forward shoulders) Lumbar Assessment Lumbar Assessment: Exceptions to [facility] (limited by pelvic pain) Postural Control Postural Control: Deficits on evaluation  Balance Balance Balance Assessed: Yes Dynamic Sitting Balance Dynamic Sitting - Balance Support: No upper extremity supported;Feet supported;During functional activity Dynamic Sitting - Level of Assistance: 5: Stand by assistance (supervision) Sitting balance - Comments: sitting EOB Static Standing Balance Static Standing - Balance Support: During functional activity;Bilateral upper extremity supported Static Standing - Level of Assistance: Other (comment) (CGA) Dynamic Standing Balance Dynamic Standing - Balance Support: Bilateral upper extremity supported;During functional activity Dynamic Standing - Level of Assistance: Other (comment) (CGA) Extremity Assessment      RLE Assessment RLE Assessment: Exceptions to WFL (moderate non pitting edema LL and foot) General Strength Comments: grossly in sitting: hip flexion 3-/5, knee extension > 3/5,  ankle DF 5/5 LLE Assessment LLE Assessment: Exceptions to WFL (moderate non pitting edema LL and foot) General Strength Comments: grossly in sitting: hip flexion and knee extension > 3+/5; ankle DF 5/5  Care Tool Care Tool Bed Mobility Roll left and right activity   Roll left and right assist level: Moderate Assistance - Patient 50 - 74%    Sit to lying activity   Sit to lying assist level: Moderate Assistance - Patient 50 - 74%    Lying to sitting on side of bed activity   Lying to sitting on side of bed assist level: the ability to move from lying on the back to sitting on the side of the bed with no back support.: Moderate Assistance - Patient 50 - 74%     Care Tool Transfers Sit to stand transfer   Sit to stand assist level: Minimal Assistance - Patient > 75%    Chair/bed transfer   Chair/bed transfer assist level: Minimal Assistance - Patient > 75%     Toilet transfer   Assist Level: Minimal Assistance - Patient > 75%    Car transfer   Car transfer assist level: Moderate Assistance - Patient 50 - 74%      Care Tool Locomotion Ambulation   Assist level: Minimal Assistance - Patient > 75% Assistive device: Walker-rolling Max distance: 25  Walk 10 feet activity   Assist level: Minimal Assistance - Patient > 75% Assistive device: Walker-rolling   Walk 50 feet with 2 turns activity Walk 50 feet with 2 turns activity did not occur: Safety/medical concerns (pain)      Walk 150 feet activity Walk 150 feet activity did not occur: Safety/medical concerns (pain)      Walk 10 feet on uneven surfaces activity Walk 10 feet on uneven surfaces activity did not occur: Safety/medical concerns (balance deficits)      Stairs   Assist level: Moderate Assistance - Patient - 50 - 74% Stairs assistive device: 1 hand rail Max number of stairs: 1  Walk up/down  1 step activity   Walk up/down 1 step (curb) assist level: Moderate Assistance - Patient - 50 - 74% Walk up/down 1 step or  curb assistive device: 1 hand rail (2 hands on L rail, per home set up)  Walk up/down 4 steps activity Walk up/down 4 steps activity did not occur: Safety/medical concerns (pain RLE)      Walk up/down 12 steps activity Walk up/down 12 steps activity did not occur: Safety/medical concerns (pain RLE)      Pick up small objects from floor Pick up small object from the floor (from standing position) activity did not occur: Safety/medical concerns (pelvic fxs)      Wheelchair Is the patient using a wheelchair?: Yes Type of Wheelchair: Manual   Wheelchair assist level: Minimal Assistance - Patient > 75% Max wheelchair distance: 150  Wheel 50 feet with 2 turns activity   Assist Level: Minimal Assistance - Patient > 75%  Wheel 150 feet activity   Assist Level: Minimal Assistance - Patient > 75%    Refer to Care Plan for Long Term Goals  SHORT TERM GOAL WEEK 1 PT Short Term Goal 1 (Week 1): = LTGs due to ELOS  Recommendations for other services: None   Skilled Therapeutic Intervention  Tx 1:  pt reported that she needed to use toilet.  Bed mobility and sit to stand below.  Gait iwith RW in room to toilet, with frequent cues, demo and TCs fro 50% wt bearing RLE.  Pt has difficulty with adhering to [facility] precautions; but does rely heavily on RW.  Pt voided continently on BSC over toilet.  She managed gown with assistance; peri care with CGA, in standing.  Hand washing at sink in standing.   Mobility and gait as below.  Stand pivot transfer wc> recliner with RW, min assist at end of session.  Pt left in recliner, reclined with bil LEs elevated, LLs on pillow to prevent wt on heels. Seat pad alarm set and needs left at hand.  Tx 2:  Pt resting in recliner.  She reported pain R hip and bil pubic areas 5/10.  She had not requested pain meds.  PT discussed use of pain meds and requesting ice pack for pelvic pain PRN to address soreness after tx.  She voiced understanding.  Seated Therapeutic  exercises performed with LE to increase strength for functional mobility: 10 x 1 each- R/L hip flexion, R/L ankle pumps with extended knee, supported by PT, bil hip adductor squeezes against towel roll. Wc propulsion and 3" Step in grid above.  Pt reported that L railing is more easily accessed as she enters home.  Pt preferred to stand sideways and use L rail to manage 1 step. Pt quite fatigued by end of session.  Son present in room. Pt transferred to recliner with min Assist and RW.  Recliner positioned as above.  Seat pad alarm set and needs left at hand. Mobility Bed Mobility Bed Mobility: Rolling Right;Rolling Left;Left Sidelying to Sit Rolling Right: Moderate Assistance - Patient 50-74% Rolling Left: Moderate Assistance - Patient 50-74% Left Sidelying to Sit: Moderate Assistance - Patient 50-74% Transfers Transfers: Sit to Stand;Stand to Sit;Stand Pivot Transfers Sit to Stand: Minimal Assistance - Patient > 75% Stand to Sit: Minimal Assistance - Patient > 75% Stand Pivot Transfers: Minimal Assistance - Patient > 75% Stand Pivot Transfer Details: Visual cues/gestures for sequencing;Verbal cues for precautions/safety;Verbal cues for safe use of DME/AE Stand Pivot Transfer Details (indicate cue type and reason): hand  placement and safe use of RW Transfer (Assistive device): Rolling walker Locomotion  Gait Ambulation: Yes Gait Assistance: Minimal Assistance - Patient > 75% Assistive device: Rolling walker Gait Assistance Details: Verbal cues for precautions/safety;Verbal cues for gait pattern;Visual cues for safe use of DME/AE Gait Gait: Yes Gait Pattern: Impaired Gait Pattern: Decreased trunk rotation;Antalgic;Step-through pattern;Decreased dorsiflexion - right;Decreased hip/knee flexion - right Gait velocity: antalgic, slow Stairs / Additional Locomotion Stairs: Not in AM eval Wheelchair Mobility Wheelchair Mobility: Not in AM eval   Discharge Criteria: Patient will be discharged  from PT if patient refuses treatment 3 consecutive times without medical reason, if treatment goals not met, if there is a change in medical status, if patient makes no progress towards goals or if patient is discharged from hospital.  The above assessment, treatment plan, treatment alternatives and goals were discussed and mutually agreed upon: by patient and by family  Denea Cheaney 07/18/2021, 4:49 PM

## 2021-07-18 NOTE — Evaluation (Signed)
Occupational Therapy Assessment and Plan  Patient Details  Name: [name] MRN: [id number] Date of Birth: February 17, 1932  OT Diagnosis: abnormal posture, acute pain, muscle weakness (generalized), pain in joint, and swelling of limb Rehab Potential: Rehab Potential (ACUTE ONLY): Good ELOS: 7-10 days   Today's Date: 07/18/2021 OT Individual Time: 7989-2119 OT Individual Time Calculation (min): 53 min     Hospital Problem: Principal Problem:   Pelvic fracture ([place])   Past Medical History:  Past Medical History:  Diagnosis Date   Allergic rhinitis    Anginal pain ([place])    Asthmatic bronchitis    Chronic cough    Dysrhythmia    PAF   Esophageal reflux    GERD (gastroesophageal reflux disease)    History of stress test 11/20/2009   This was essentially normal,post-stress EF was hyperdynamic at 73 beats per mins, There was no scar or ischemia.   Hx of echocardiogram 11/05/2010   EF>55% showed a normal systolic function with impaired diastolic dysfuntion and also tissue doppler suggesting increased elevated left atrial pressure, she had moderate LA dilatation and mild LA dilatation. There was calcified mitral apparatus with moderate MR, mild to moderate TR and trace aortic insufficiency.   Hyperlipemia    Hypertension    Hypothyroidism    PAF (paroxysmal atrial fibrillation) ([place])    Pneumonia    Past Surgical History:  Past Surgical History:  Procedure Laterality Date   CHOLECYSTECTOMY     CYSTOSCOPY WITH BIOPSY N/A 08/29/2020   Procedure: CYSTOSCOPY WITH BIOPSY WITH FULGURATION;  Surgeon: Irine Seal, MD;  Location: [facility];  Service: Urology;  Laterality: N/A;   FOOT SURGERY     right   NASAL SEPTUM SURGERY     PACEMAKER INSERTION  June 1st 2009   St Jude [place] device, model #5826, serial #[id number], The artial lead is a Interior and spatial designer, the ventricular lead is Environmental education officer.   PPM GENERATOR CHANGEOUT N/A 02/09/2017   Procedure: PPM GENERATOR CHANGEOUT;  Surgeon: Sanda Klein, MD;  Location: [facility];  Service: Cardiovascular;  Laterality: N/A;   THYROID SURGERY     TONSILLECTOMY     TONSILLECTOMY     VESICOVAGINAL FISTULA CLOSURE W/ TAH      Assessment & Plan Clinical Impression:   [name] is a 88 year old right-handed female with history of hypertension, hyperlipidemia, SSS status post PPM 2009 with generator change out 2018 maintained on aspirin, CKD stage III.  Per chart review patient lives with her children.  1 level home 5 steps to entry.  She uses a rolling walker for mobility.  Her son assists with meal preparation and occasional transfers.  Presented 07/13/2021 with bilateral lower extremity edema and right hip pain.  Patient reportedly had a steroid injection into her back approxi-1 week ago with progressive worsening right lower extremity pain.  She denies any recent trauma however she did report when she was assisted onto the table for recent injection she felt the move was somewhat sudden and she felt like she hit the hard table too hard at that time..  X-ray right pelvis/hip showed step-off deformity of the right superior pubic ramus concerning for fracture.  CT of the right hip showed right superior and inferior pubic ramus fracture.  Orthopedic service follow-up Dr. Alvan Dame given the proximity of the fractures to her acetabulum advised partial weightbearing 50% right lower extremity and no surgical intervention.  Admission chemistry sodium 134, BUN 66, creatinine 1.71, WBC 12,200 troponin 12-19, urinalysis negative  nitrite, BNP 23.5.    Echocardiogram with ejection fraction of 60 to 65% no wall motion abnormalities grade 1 diastolic dysfunction.  In regards to patient's AKI on CKD stage III ultrasound suggested possible obstruction with large volume of urine in the bladder however right after the procedure she had a 1 L urinary output and several hours later bladder ultrasound showed 470 mL and immediately afterwards she voided 500 mL.  No  current concern for true obstruction and she did receive IV diuresis latest creatinine improved 1.30..  Maintained on Lovenox for DVT prophylaxis.  Therapy evaluations completed due to patient decreased functional mobility was admitted for a comprehensive rehab program. Patient transferred to CIR on 07/17/2021 .    Patient currently requires mod with basic self-care skills secondary to muscle weakness and muscle joint tightness, decreased cardiorespiratoy endurance, and impaired timing and sequencing, decreased coordination, and decreased motor planning.  Prior to hospitalization, patient could complete all self-care with independent .  Patient will benefit from skilled intervention to decrease level of assist with basic self-care skills, increase independence with basic self-care skills, and increase level of independence with iADL prior to discharge home with care partner.  Anticipate patient will require intermittent supervision and follow up home health.  OT - End of Session Activity Tolerance: Tolerates 10 - 20 min activity with multiple rests Endurance Deficit: Yes Endurance Deficit Description: required seated rest breaks during functional tasks OT Assessment Rehab Potential (ACUTE ONLY): Good OT Barriers to Discharge: Home environment access/layout;Weight bearing restrictions;Weight OT Patient demonstrates impairments in the following area(s): Balance;Safety;Edema;Endurance;Motor;Pain;Cognition OT Basic ADL's Functional Problem(s): Grooming;Bathing;Dressing;Toileting OT Transfers Functional Problem(s): Toilet;Tub/Shower OT Plan OT Intensity: Minimum of 1-2 x/day, 45 to 90 minutes OT Frequency: 5 out of 7 days OT Duration/Estimated Length of Stay: 7-10 days OT Treatment/Interventions: Balance/vestibular training;Discharge planning;Pain management;Self Care/advanced ADL retraining;Therapeutic Activities;UE/LE Coordination activities;Functional mobility training;Patient/family  education;Therapeutic Exercise;Community reintegration;DME/adaptive equipment instruction;Psychosocial support;UE/LE Strength taining/ROM;Wheelchair propulsion/positioning OT Self Feeding Anticipated Outcome(s): Independent OT Basic Self-Care Anticipated Outcome(s): Supervision OT Toileting Anticipated Outcome(s): Supervision OT Bathroom Transfers Anticipated Outcome(s): Supervision OT Recommendation Patient destination: Home Follow Up Recommendations: Home health OT;24 hour supervision/assistance Equipment Recommended: To be determined   OT Evaluation Precautions/Restrictions  Precautions Precautions: Fall Restrictions RLE Weight Bearing: Partial weight bearing RLE Partial Weight Bearing Percentage or Pounds: 50 Home Living/Prior [place] expects to be discharged to:: Private residence Living Arrangements: Children Available Help at Discharge: Family, Available PRN/intermittently (reports only having assist for a couple weeks) Type of Home: House Home Access: Stairs to enter (at entrance, 3 STE wiht 2 wide rails) Entrance Stairs-Number of Steps: 5 Entrance Stairs-Rails: Right, Left (can only reach one at a time) Home Layout: One level Bathroom Shower/Tub: Walk-in shower (small step to enter) Biochemist, clinical: Standard Bathroom Accessibility: Yes Additional Comments: states her walker does not have wheels  Lives With: Son IADL History Homemaking Responsibilities: No Current License: Yes Mode of Transportation: Musician Occupation: Retired 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 [employer] and Hobbies: reading, watch tv IADL Comments: reports not making any meals, not really doing any house hold tasks Prior Function Level of Independence: Independent with transfers, Independent with gait, Independent with basic ADLs  Able to Take Stairs?: Yes Driving: No Vocation: Retired Leisure: Hobbies-yes (Comment) (plays piano) Vision Baseline  Vision/History: 1 Wears glasses (just for reading) Ability to See in Adequate Light: 0 Adequate Patient Visual Report: No change from baseline Vision Assessment?: No apparent visual deficits Perception  Perception: Within Functional Limits  Praxis Praxis: Intact Cognition Overall Cognitive Status: Within Functional Limits for tasks assessed Arousal/Alertness: Awake/alert Orientation Level: Person;Place;Situation Person: Oriented Place: Oriented Situation: Oriented Year: 2023 Month: January Day of Week: Correct Memory: Appears intact Immediate Memory Recall: Sock;Blue;Bed Memory Recall Sock: Without Cue Memory Recall Blue: With Cue Memory Recall Bed: Without Cue Awareness: Appears intact Problem Solving: Impaired Safety/Judgment: Impaired Sensation Sensation Light Touch: Appears Intact Hot/Cold: Appears Intact Proprioception: Appears Intact Stereognosis: Not tested Coordination Gross Motor Movements are Fluid and Coordinated: No Fine Motor Movements are Fluid and Coordinated: No Coordination and Movement Description: grossly uncoordinated due to postural and WB precautions during transfers Finger Nose Finger Test: WFL, however dysmetria bilaterally Motor  Motor Motor: Within Functional Limits Motor - Skilled Clinical Observations: generalized weakness  Trunk/Postural Assessment  Cervical Assessment Cervical Assessment: Within Functional Limits Thoracic Assessment Thoracic Assessment: Exceptions to [facility] (kyphotic, forward shoulders) Lumbar Assessment Lumbar Assessment: Exceptions to [name] (limited by pelvic pain) Postural Control Postural Control: Deficits on evaluation  Balance Balance Balance Assessed: Yes Dynamic Sitting Balance Dynamic Sitting - Balance Support: No upper extremity supported;Feet supported;During functional activity Dynamic Sitting - Level of Assistance: 5: Stand by assistance (supervision) Sitting balance - Comments: sitting EOB Static Standing  Balance Static Standing - Balance Support: During functional activity;Bilateral upper extremity supported Static Standing - Level of Assistance: Other (comment) (CGA) Dynamic Standing Balance Dynamic Standing - Balance Support: Bilateral upper extremity supported;During functional activity Dynamic Standing - Level of Assistance: Other (comment) (CGA) Dynamic Standing - Comments: RW used for standing and transfers Extremity/Trunk Assessment RUE Assessment RUE Assessment: Within Functional Limits Active Range of Motion (AROM) Comments: >150 General Strength Comments: Grossly 4/5 LUE Assessment LUE Assessment: Within Functional Limits Active Range of Motion (AROM) Comments: >150 General Strength Comments: Grossly 4/5  Care Tool Care Tool Self Care Eating   Eating Assist Level: Independent    Oral Care    Oral Care Assist Level: Set up assist    Bathing   Body parts bathed by patient: Right arm;Left arm;Chest;Abdomen;Right upper leg;Left upper leg;Right lower leg;Left lower leg;Face Body parts bathed by helper: Front perineal area;Buttocks   Assist Level: Minimal Assistance - Patient > 75%    Upper Body Dressing(including orthotics)   What is the patient wearing?: Pull over shirt   Assist Level: Set up assist    Lower Body Dressing (excluding footwear)   What is the patient wearing?: Pants;Underwear/pull up Assist for lower body dressing: Moderate Assistance - Patient 50 - 74%    Putting on/Taking off footwear   What is the patient wearing?: Non-skid slipper socks Assist for footwear: Dependent - Patient 0%       Care Tool Toileting Toileting activity   Assist for toileting: Moderate Assistance - Patient 50 - 74%     Care Tool Bed Mobility Roll left and right activity   Roll left and right assist level: Moderate Assistance - Patient 50 - 74%    Sit to lying activity   Sit to lying assist level: Moderate Assistance - Patient 50 - 74%    Lying to sitting on side of  bed activity   Lying to sitting on side of bed assist level: the ability to move from lying on the back to sitting on the side of the bed with no back support.: Moderate Assistance - Patient 50 - 74%     Care Tool Transfers Sit to stand transfer   Sit to stand assist level: Minimal Assistance - Patient > 75%  Chair/bed transfer   Chair/bed transfer assist level: Minimal Assistance - Patient > 75%     Toilet transfer   Assist Level: Minimal Assistance - Patient > 75%     Care Tool Cognition  Expression of Ideas and Wants Expression of Ideas and Wants: 4. Without difficulty (complex and basic) - expresses complex messages without difficulty and with speech that is clear and easy to understand  Understanding Verbal and Non-Verbal Content Understanding Verbal and Non-Verbal Content: 3. Usually understands - understands most conversations, but misses some part/intent of message. Requires cues at times to understand   Memory/Recall Ability Memory/Recall Ability : Current season;That he or she is in a hospital/hospital unit   Refer to Care Plan for [place] 1 OT Short Term Goal 1 (Week 1): STG = LTG due to ELOS  Recommendations for other services: None    Skilled Therapeutic Intervention Skilled OT intervention completed with discussion on POC, rehab goals and explanation of OT purpose. Pt received supine in bed, agreeable to session. With HOB elevated, pt able to advance BLEs towards EOB with supervision and increased time, then sit EOB for self-care tasks with supervision. Pt completed bathing with min A for lower legs and feet, UBD with set up A, LBD with mod A with most difficulty for threading BLEs into pants, mod A for toileting tasks. Pt completed sit > stand with min A using RW with mod-max verbal cues for WB precautions as pt had difficulty with PWB during all stands and transfers. Pt able to stand pivot with RW to [facility] with min A, as well as to recliner.  Pt asymptomatic, denying dizziness or pain this session. See caretool for further details on assist level with self-care tasks performed. Pt left seated in recliner, with BLEs elevated, chair alarm on and applied to seat and all needs in reach at end of session.   ADL ADL Eating: Set up Where Assessed-Eating: Chair Grooming: Setup Where Assessed-Grooming: Edge of bed Upper Body Bathing: Supervision/safety Where Assessed-Upper Body Bathing: Edge of bed Lower Body Bathing: Minimal assistance Where Assessed-Lower Body Bathing: Edge of bed Upper Body Dressing: Supervision/safety Where Assessed-Upper Body Dressing: Edge of bed Lower Body Dressing: Moderate assistance Where Assessed-Lower Body Dressing: Edge of bed Toileting: Moderate assistance Where Assessed-Toileting: Bedside Commode Toilet Transfer: Minimal assistance Toilet Transfer Method: Stand pivot (RW) Toilet Transfer Equipment: Radiographer, therapeutic: Not assessed Social research officer, government: Not assessed Mobility  Bed Mobility Bed Mobility: Rolling Right;Rolling Left;Left Sidelying to Sit Rolling Right: Moderate Assistance - Patient 50-74% Rolling Left: Moderate Assistance - Patient 50-74% Left Sidelying to Sit: Moderate Assistance - Patient 50-74% Transfers Sit to Stand: Minimal Assistance - Patient > 75% Stand to Sit: Minimal Assistance - Patient > 75%   Discharge Criteria: Patient will be discharged from OT if patient refuses treatment 3 consecutive times without medical reason, if treatment goals not met, if there is a change in medical status, if patient makes no progress towards goals or if patient is discharged from hospital.  The above assessment, treatment plan, treatment alternatives and goals were discussed and mutually agreed upon: by patient  Davina Howlett E Kandy Towery 07/18/2021, 10:05 AM

## 2021-07-18 NOTE — Progress Notes (Signed)
PROGRESS NOTE   Subjective/Complaints: Pt was nervous last night about her fractures, expected healing, what was going to happen today, etc. Did better after she received ativan  ROS: Patient denies fever, rash, sore throat, blurred vision, dizziness, nausea, vomiting, diarrhea, cough, shortness of breath or chest pain    Objective:   No results found. No results for input(s): WBC, HGB, HCT, PLT in the last 72 hours. No results for input(s): NA, K, CL, CO2, GLUCOSE, BUN, CREATININE, CALCIUM in the last 72 hours.  Intake/Output Summary (Last 24 hours) at 07/18/2021 1208 Last data filed at 07/18/2021 0805 Gross per 24 hour  Intake 720 ml  Output --  Net 720 ml        Physical Exam: Vital Signs Blood pressure (!) 121/48, pulse 71, temperature 98.7 F (37.1 C), temperature source Oral, resp. rate 18, height 4\' 11"  (1.499 m), weight 72.5 kg, SpO2 96 %. General: Alert and oriented x 3, No apparent distress HEENT: Head is normocephalic, atraumatic, PERRLA, EOMI, sclera anicteric, oral mucosa pink and moist, dentition intact, ext ear canals clear,  Neck: Supple without JVD or lymphadenopathy Heart: Reg rate and rhythm. No murmurs rubs or gallops Chest: CTA bilaterally without wheezes, rales, or rhonchi; no distress Abdomen: Soft, non-tender, non-distended, bowel sounds positive. Extremities: No clubbing, cyanosis, or edema. Pulses are 2+ Psych: anxious but quite pleasant Skin: Clean and intact without signs of breakdown, scattered ecchymoses and old blisters on legs.  Neuro:  Alert and oriented x 3. Normal insight and awareness. Intact Memory. Normal language and speech. Cranial nerve exam unremarkable. UE 5/5. LE limited proximally by pain, 2-3/5 to 4/5 distally Musculoskeletal: R>L pelvic pain with ROM    Assessment/Plan: 1. Functional deficits which require 3+ hours per day of interdisciplinary therapy in a comprehensive  inpatient rehab setting. Physiatrist is providing close team supervision and 24 hour management of active medical problems listed below. Physiatrist and rehab team continue to assess barriers to discharge/monitor patient progress toward functional and medical goals  Care Tool:  Bathing    Body parts bathed by patient: Right arm, Left arm, Chest, Abdomen, Right upper leg, Left upper leg, Right lower leg, Left lower leg, Face   Body parts bathed by helper: Front perineal area, Buttocks     Bathing assist Assist Level: Minimal Assistance - Patient > 75%     Upper Body Dressing/Undressing Upper body dressing   What is the patient wearing?: Pull over shirt    Upper body assist Assist Level: Set up assist    Lower Body Dressing/Undressing Lower body dressing      What is the patient wearing?: Pants, Underwear/pull up     Lower body assist Assist for lower body dressing: Moderate Assistance - Patient 50 - 74%     Toileting Toileting    Toileting assist Assist for toileting: Moderate Assistance - Patient 50 - 74%     Transfers Chair/bed transfer  Transfers assist     Chair/bed transfer assist level: Minimal Assistance - Patient > 75%     Locomotion Ambulation   Ambulation assist      Assist level: Minimal Assistance - Patient > 75% Assistive device: Walker-rolling Max  distance: 25   Walk 10 feet activity   Assist     Assist level: Minimal Assistance - Patient > 75% Assistive device: Walker-rolling   Walk 50 feet activity   Assist Walk 50 feet with 2 turns activity did not occur: Safety/medical concerns (pain)         Walk 150 feet activity   Assist Walk 150 feet activity did not occur: Safety/medical concerns (pain)         Walk 10 feet on uneven surface  activity   Assist Walk 10 feet on uneven surfaces activity did not occur: Safety/medical concerns (balance deficits)         Wheelchair     Assist                Wheelchair 50 feet with 2 turns activity    Assist            Wheelchair 150 feet activity     Assist          Blood pressure (!) 121/48, pulse 71, temperature 98.7 F (37.1 C), temperature source Oral, resp. rate 18, height 4\' 11"  (1.499 m), weight 72.5 kg, SpO2 96 %. Medical Problem List and Plan: 1. Functional deficits secondary to right superior and inferior pubic ramus fractures of unclear etiology. Pt denies recent fall.   -Partial weightbearing RLE due to close proximity of SPR fx to acetabulum             -patient may shower             -ELOS/Goals: 10-14 days, goals supervision with PT, sup/min OT  -Patient is beginning CIR therapies today including PT and OT   -reassured pt that it would be difficult to re-injure her pelvis while moving within bed 2.  Antithrombotics: -DVT/anticoagulation:  Pharmaceutical: Lovenox.Vascular study negative             -antiplatelet therapy: Aspirin 81 mg daily 3. Pain Management: Hydrocodone as needed 4. Mood/anxiety disorder: Ativan 0.5 mg daily as needed, pt used this at home             -low dose trazodone if needed for sleep as well  -provided reassurance to pt today             -antipsychotic agents: N/A 5. Neuropsych: This patient is capable of making decisions on her own behalf. 6. Skin/Wound Care: Routine skin checks 7. Fluids/Electrolytes/Nutrition: Routine in and outs with follow-up chemistries 8.  AKI on CKD stage III.             -check labs on Monday             -pt still appears a little dry on exam. Encourage fluids. 9.  PAF/SSS/PPM.  Cardizem 180 mg daily, Toprol 25 mg nightly.  Follow-up per cardiology services             -monitor activity tolerance with therapies 10.  Hyperlipidemia.  Pravachol 11.  GERD.  Protonix      LOS: 1 days A FACE TO FACE EVALUATION WAS PERFORMED  Meredith Staggers 07/18/2021, 12:08 PM

## 2021-07-19 DIAGNOSIS — S32591S Other specified fracture of right pubis, sequela: Secondary | ICD-10-CM | POA: Diagnosis not present

## 2021-07-19 DIAGNOSIS — F411 Generalized anxiety disorder: Secondary | ICD-10-CM | POA: Diagnosis not present

## 2021-07-19 DIAGNOSIS — I480 Paroxysmal atrial fibrillation: Secondary | ICD-10-CM | POA: Diagnosis not present

## 2021-07-19 DIAGNOSIS — N179 Acute kidney failure, unspecified: Secondary | ICD-10-CM | POA: Diagnosis not present

## 2021-07-19 MED ORDER — SORBITOL 70 % SOLN
30.0000 mL | Freq: Every day | Status: DC | PRN
  Administered 2021-07-20 – 2021-07-21 (×2): 30 mL via ORAL
  Filled 2021-07-19 (×2): qty 30

## 2021-07-19 MED ORDER — DOCUSATE SODIUM 100 MG PO CAPS
100.0000 mg | ORAL_CAPSULE | Freq: Two times a day (BID) | ORAL | Status: DC
  Administered 2021-07-19 – 2021-07-25 (×12): 100 mg via ORAL
  Filled 2021-07-19 (×12): qty 1

## 2021-07-19 MED ORDER — BISACODYL 10 MG RE SUPP: 10.0000 mg | Freq: Every day | RECTAL | Status: DC | PRN

## 2021-07-19 NOTE — Progress Notes (Signed)
PROGRESS NOTE   Subjective/Complaints: Still anxious this morning. Was able to sleep last night but feels that her legs are up to high and that she's in an awkward spot in her bed. Does say that therapies went well yesterday  ROS: Patient denies fever, rash, sore throat, blurred vision, dizziness, nausea, vomiting, diarrhea, cough, shortness of breath or chest pain    Objective:   No results found. No results for input(s): WBC, HGB, HCT, PLT in the last 72 hours. No results for input(s): NA, K, CL, CO2, GLUCOSE, BUN, CREATININE, CALCIUM in the last 72 hours.  Intake/Output Summary (Last 24 hours) at 07/19/2021 0923 Last data filed at 07/19/2021 0752 Gross per 24 hour  Intake 360 ml  Output --  Net 360 ml        Physical Exam: Vital Signs Blood pressure (!) 138/56, pulse 73, temperature 97.9 F (36.6 C), temperature source Oral, resp. rate 18, height 4\' 11"  (1.499 m), weight 72.5 kg, SpO2 96 %. Constitutional: No distress . Vital signs reviewed. HEENT: NCAT, EOMI, oral membranes moist Neck: supple Cardiovascular: RRR without murmur. No JVD    Respiratory/Chest: CTA Bilaterally without wheezes or rales. Normal effort    GI/Abdomen: BS +, non-tender, non-distended Ext: no clubbing, cyanosis, 1+ LE edema Psych: pleasant and cooperative, anxious  Skin: Clean and intact without signs of breakdown, scattered ecchymoses and old blisters on legs.  Neuro:  Alert and oriented x 3. Normal insight and awareness. Intact Memory. Normal language and speech. Cranial nerve exam unremarkable. UE 5/5. LE limited proximally by pain, 2-3/5 to 4/5 distally Musculoskeletal: R>L pelvic pain with bed mobility   Assessment/Plan: 1. Functional deficits which require 3+ hours per day of interdisciplinary therapy in a comprehensive inpatient rehab setting. Physiatrist is providing close team supervision and 24 hour management of active medical  problems listed below. Physiatrist and rehab team continue to assess barriers to discharge/monitor patient progress toward functional and medical goals  Care Tool:  Bathing    Body parts bathed by patient: Right arm, Left arm, Chest, Abdomen, Right upper leg, Left upper leg, Right lower leg, Left lower leg, Face   Body parts bathed by helper: Front perineal area, Buttocks     Bathing assist Assist Level: Minimal Assistance - Patient > 75%     Upper Body Dressing/Undressing Upper body dressing   What is the patient wearing?: Hospital gown only    Upper body assist Assist Level: Contact Guard/Touching assist    Lower Body Dressing/Undressing Lower body dressing      What is the patient wearing?: Pants, Underwear/pull up     Lower body assist Assist for lower body dressing: Moderate Assistance - Patient 50 - 74%     Toileting Toileting    Toileting assist Assist for toileting: Moderate Assistance - Patient 50 - 74%     Transfers Chair/bed transfer  Transfers assist     Chair/bed transfer assist level: Minimal Assistance - Patient > 75%     Locomotion Ambulation   Ambulation assist      Assist level: Minimal Assistance - Patient > 75% Assistive device: Walker-rolling Max distance: 25   Walk 10 feet activity  Assist     Assist level: Minimal Assistance - Patient > 75% Assistive device: Walker-rolling   Walk 50 feet activity   Assist Walk 50 feet with 2 turns activity did not occur: Safety/medical concerns (pain)         Walk 150 feet activity   Assist Walk 150 feet activity did not occur: Safety/medical concerns (pain)         Walk 10 feet on uneven surface  activity   Assist Walk 10 feet on uneven surfaces activity did not occur: Safety/medical concerns (balance deficits)         Wheelchair     Assist Is the patient using a wheelchair?: Yes Type of Wheelchair: Manual    Wheelchair assist level: Minimal Assistance -  Patient > 75% Max wheelchair distance: 150    Wheelchair 50 feet with 2 turns activity    Assist        Assist Level: Minimal Assistance - Patient > 75%   Wheelchair 150 feet activity     Assist      Assist Level: Minimal Assistance - Patient > 75%   Blood pressure (!) 138/56, pulse 73, temperature 97.9 F (36.6 C), temperature source Oral, resp. rate 18, height 4\' 11"  (1.499 m), weight 72.5 kg, SpO2 96 %. Medical Problem List and Plan: 1. Functional deficits secondary to right superior and inferior pubic ramus fractures of unclear etiology. Pt denies recent fall.   -Partial weightbearing RLE due to close proximity of SPR fx to acetabulum             -patient may shower             -ELOS/Goals: 10-14 days, goals supervision with PT, sup/min OT  -Continue CIR therapies including PT, OT   -told pt she was free to shift/move in bed as she tolerates 2.  Antithrombotics: -DVT/anticoagulation:  Pharmaceutical: Lovenox.Vascular study negative             -antiplatelet therapy: Aspirin 81 mg daily 3. Pain Management: Hydrocodone as needed 4. Mood/anxiety disorder: Ativan 0.5 mg daily as needed, pt used this at home             -low dose trazodone if needed for sleep as well  -needs regular positive reinforcement             -antipsychotic agents: N/A 5. Neuropsych: This patient is capable of making decisions on her own behalf. 6. Skin/Wound Care: Routine skin checks 7. Fluids/Electrolytes/Nutrition: Routine in and outs with follow-up chemistries 8.  AKI on CKD stage III.             -check labs on Monday             - Encourage fluids.  -elevate legs for sl edema, KHT's 9.  PAF/SSS/PPM.  Cardizem 180 mg daily, Toprol 25 mg nightly.  Follow-up per cardiology services             -monitor activity tolerance with therapies 10.  Hyperlipidemia.  Pravachol 11.  GERD.  Protonix      LOS: 2 days A FACE TO FACE EVALUATION WAS PERFORMED  Meredith Staggers 07/19/2021, 9:23  AM

## 2021-07-20 DIAGNOSIS — S32609S Unspecified fracture of unspecified ischium, sequela: Secondary | ICD-10-CM

## 2021-07-20 DIAGNOSIS — N179 Acute kidney failure, unspecified: Secondary | ICD-10-CM | POA: Diagnosis not present

## 2021-07-20 DIAGNOSIS — S32599S Other specified fracture of unspecified pubis, sequela: Secondary | ICD-10-CM | POA: Diagnosis not present

## 2021-07-20 LAB — CBC WITH DIFFERENTIAL/PLATELET
Abs Immature Granulocytes: 0.05 10*3/uL (ref 0.00–0.07)
Basophils Absolute: 0.1 10*3/uL (ref 0.0–0.1)
Basophils Relative: 1 %
Eosinophils Absolute: 0.4 10*3/uL (ref 0.0–0.5)
Eosinophils Relative: 5 %
HCT: 31.8 % — ABNORMAL LOW (ref 36.0–46.0)
Hemoglobin: 10.5 g/dL — ABNORMAL LOW (ref 12.0–15.0)
Immature Granulocytes: 1 %
Lymphocytes Relative: 23 %
Lymphs Abs: 1.8 10*3/uL (ref 0.7–4.0)
MCH: 31.1 pg (ref 26.0–34.0)
MCHC: 33 g/dL (ref 30.0–36.0)
MCV: 94.1 fL (ref 80.0–100.0)
Monocytes Absolute: 0.7 10*3/uL (ref 0.1–1.0)
Monocytes Relative: 9 %
Neutro Abs: 4.8 10*3/uL (ref 1.7–7.7)
Neutrophils Relative %: 61 %
Platelets: 225 10*3/uL (ref 150–400)
RBC: 3.38 MIL/uL — ABNORMAL LOW (ref 3.87–5.11)
RDW: 13 % (ref 11.5–15.5)
WBC: 7.8 10*3/uL (ref 4.0–10.5)
nRBC: 0 % (ref 0.0–0.2)

## 2021-07-20 LAB — COMPREHENSIVE METABOLIC PANEL
ALT: 50 U/L — ABNORMAL HIGH (ref 0–44)
AST: 70 U/L — ABNORMAL HIGH (ref 15–41)
Albumin: 2.9 g/dL — ABNORMAL LOW (ref 3.5–5.0)
Alkaline Phosphatase: 96 U/L (ref 38–126)
Anion gap: 8 (ref 5–15)
BUN: 20 mg/dL (ref 8–23)
CO2: 29 mmol/L (ref 22–32)
Calcium: 8.6 mg/dL — ABNORMAL LOW (ref 8.9–10.3)
Chloride: 101 mmol/L (ref 98–111)
Creatinine, Ser: 0.92 mg/dL (ref 0.44–1.00)
GFR, Estimated: >60 mL/min (ref >60)
Glucose, Bld: 99 mg/dL (ref 70–99)
Potassium: 3.5 mmol/L (ref 3.5–5.1)
Sodium: 138 mmol/L (ref 135–145)
Total Bilirubin: 0.4 mg/dL (ref 0.3–1.2)
Total Protein: 5.9 g/dL — ABNORMAL LOW (ref 6.5–8.1)

## 2021-07-20 MED ORDER — POTASSIUM CHLORIDE 20 MEQ PO PACK
40.0000 meq | PACK | Freq: Once | ORAL | Status: AC
  Administered 2021-07-20: 40 meq via ORAL
  Filled 2021-07-20: qty 2

## 2021-07-20 MED ORDER — ENOXAPARIN SODIUM 40 MG/0.4ML IJ SOSY
40.0000 mg | PREFILLED_SYRINGE | INTRAMUSCULAR | Status: DC
  Administered 2021-07-20 – 2021-07-24 (×5): 40 mg via SUBCUTANEOUS
  Filled 2021-07-20 (×5): qty 0.4

## 2021-07-20 NOTE — Progress Notes (Signed)
Physical Therapy Session Note  Patient Details  Name: [name] MRN: [id number] Date of Birth: 01-Jan-1932  Today's Date: 07/20/2021 PT Individual Time: 1000-1056 and 1300-1324 PT Individual Time Calculation (min): 56 min and 24 min  Short Term Goals: Week 1:  PT Short Term Goal 1 (Week 1): = LTGs due to ELOS  Skilled Therapeutic Interventions/Progress Updates:   Treatment Session 1 Received pt sitting on commode. Pt agreeable to PT treatment and reported "soreness" in R hip but denied any pain. Session with emphasis on toileting, functional mobility/transfers, generalized strengthening, dynamic standing balance/coordination, and gait training. Sit<>stand<>pivot from commode to [facility] with RW and min A and required CGA to perform peri-care and pull pants over hips. Pt demonstrates poor adherence to RLE PWB status despite cues. Pt sat in [place] and washed hands with supervision. Per OT, pt orthostatic this morning. BP sitting in WC: 145/80 - pt asymptomatic. BP standing: 135/101 - pt asymptomatic. Pt transported to/from room in [facility] dependently for time management purposes. Adjusted height of RW and transferred sit<>stand with RW and min A and ambulated 47ft x 1 and 105ft x 1 with RW and CGA/min A. Demonstrated technique for WB through R heel only to maintain RLE 50% PWB status pt with decent carry over of WB precautions with min/mod cues to recall. Educated pt on importance of recognizing when it becomes difficult to maintain WB through heel - indicating need to sit and rest. Discussed home entry with pt reporting 5 7in STE in front with 2 rails but cannot reach both and 4 STE in back with 2 rails that she can reach both. Discussed options for a temporary ramp or ambulance transport home with overall recommendation for temporary ramp - pt will need to discuss further with son. CSW notified to provide pt with ramp resources. Sit<>stand with RW and CGA and pt ambulated 6ft with RW and CGA to recliner - again with  cues for WB through R heel only. Concluded session with pt sitting in recliner, needs within reach, and chair pad alarm on. Provided pt with fresh drink.   Treatment Session 2 Received pt sitting in recliner, pt agreeable to PT treatment, and denied any pain but reported "soreness" in R hip and urge to use restroom. RN notified and present to administer medications and NT present to check vitals during session. Session with emphasis on functional mobility/transfers, toileting, and gait training. Sit<>stand with RW and CGA and ambulated 83ft x 2 trials with RW and CGA/min A in/out of bathroom - cues for WB through R heel only to maintain 50% PWB status. Pt also required cues when navigating threshold to enter/exit bathroom. Pt able to manage clothing with CGA and continent of bladder - performed peri-care standing with CGA. Stood at sink and washed hands with close supervision. Concluded session with pt sitting in recliner, needs within reach, and chair pad alarm on. Provided pt with ice pack for R hip.   Therapy Documentation Precautions:  Precautions Precautions: Fall Restrictions Weight Bearing Restrictions: Yes RLE Weight Bearing: Partial weight bearing RLE Partial Weight Bearing Percentage or Pounds: 50  Therapy/Group: Individual Therapy Alfonse Alpers PT, DPT   07/20/2021, 7:18 AM

## 2021-07-20 NOTE — Progress Notes (Signed)
Inpatient Rehabilitation  Patient information reviewed and entered into eRehab system by Kalayna Noy M. Tryston Gilliam, M.A., CCC/SLP, PPS Coordinator.  Information including medical coding, functional ability and quality indicators will be reviewed and updated through discharge.    

## 2021-07-20 NOTE — IPOC Note (Signed)
Overall Plan of Care [facility]) Patient Details Name: [name] MRN: [id number] DOB: 06-01-1932  Admitting Diagnosis: Pelvic fracture [facility])  Hospital Problems: Principal Problem:   Pelvic fracture ([place])     Functional Problem List: Nursing Medication Management, Safety, Pain, Edema  PT Balance, Edema, Safety, Endurance, Motor, Pain  OT Balance, Safety, Edema, Endurance, Motor, Pain, Cognition  SLP    TR         Basic ADLs: OT Grooming, Bathing, Dressing, Toileting     Advanced  ADLs: OT       Transfers: PT Bed Mobility, Bed to Chair, Car, Manufacturing systems engineer, Metallurgist: PT Ambulation, Emergency planning/management officer, Stairs     Additional Impairments: OT    SLP        TR      Anticipated Outcomes Item Anticipated Outcome  Self Feeding Independent  Set designer Transfers Supervision  Bowel/Bladder  N/A  Transfers  S basic and car  Locomotion  S contolled environment gait wiht LRAD, x 150' S household gait x 50' with LRAD, S community gait x 150' with LRAD, S wc propulsion x 150' controlled environment for activity tolerance  Communication     Cognition     Pain  Pain at or below level 4 with prn meds  Safety/Judgment  maintain safety with cues   Therapy Plan: PT Intensity: Minimum of 1-2 x/day ,45 to 90 minutes PT Frequency: 5 out of 7 days PT Duration Estimated Length of Stay: 7-9 OT Intensity: Minimum of 1-2 x/day, 45 to 90 minutes OT Frequency: 5 out of 7 days OT Duration/Estimated Length of Stay: 7-10 days     Due to the current state of emergency, patients may not be receiving their 3-hours of Medicare-mandated therapy.   Team Interventions: Nursing Interventions Disease Management/Prevention, Medication Management, Discharge Planning, Pain Management, Patient/Family Education  PT interventions Ambulation/gait training, Discharge planning, DME/adaptive equipment  instruction, Functional mobility training, Pain management, Psychosocial support, Splinting/orthotics, Therapeutic Activities, UE/LE Strength taining/ROM, Training and development officer, Community reintegration, Neuromuscular re-education, Patient/family education, IT trainer, Therapeutic Exercise, UE/LE Coordination activities, Wheelchair propulsion/positioning  OT Interventions Training and development officer, Discharge planning, Pain management, Self Care/advanced ADL retraining, Therapeutic Activities, UE/LE Coordination activities, Functional mobility training, Patient/family education, Therapeutic Exercise, Community reintegration, Engineer, drilling, Psychosocial support, UE/LE Strength taining/ROM, Wheelchair propulsion/positioning  SLP Interventions    TR Interventions    SW/CM Interventions Discharge Planning, Psychosocial Support, Patient/Family Education, Disease Management/Prevention   Barriers to Discharge MD  Medical stability  Nursing Decreased caregiver support, Home environment access/layout, Weight bearing restrictions 1 level 5ste bil rails with son  PT      OT Home environment access/layout, Weight bearing restrictions, Weight    SLP      SW       Team Discharge Planning: Destination: PT-Home ,OT- Home , SLP-  Projected Follow-up: PT-Home health PT, OT-  Home health OT, 24 hour supervision/assistance, SLP-  Projected Equipment Needs: PT-Rolling walker with 5" wheels, OT- To be determined, SLP-  Equipment Details: PT-pt reports that her RW is very old and that she will need a new one, OT-  Patient/family involved in discharge planning: PT- Patient, Family member/caregiver,  OT-Patient, SLP-   MD ELOS: 10-14 days Medical Rehab Prognosis:  Excellent Assessment: [name] is an 88 year old woman admitted to CIR with functional deficits secondary to right superior and inferior pubic ramus fractures of unclear  etiology. Pt denies recent fall. Medications are  being managed, and labs and vitals are being monitored regularly.      See Team Conference Notes for weekly updates to the plan of care

## 2021-07-20 NOTE — Progress Notes (Signed)
PROGRESS NOTE   Subjective/Complaints: On bedside commode trying to tinkle She asks which blood pressure medications she is currently on and how this compares to her home medications She has no other complaints.   ROS: Patient denies fever, rash, sore throat, blurred vision, dizziness, nausea, vomiting, diarrhea, cough, shortness of breath or chest pain, +difficulty urinating   Objective:   No results found. Recent Labs    07/20/21 0635  WBC 7.8  HGB 10.5*  HCT 31.8*  PLT 225   Recent Labs    07/20/21 0635  NA 138  K 3.5  CL 101  CO2 29  GLUCOSE 99  BUN 20  CREATININE 0.92  CALCIUM 8.6*    Intake/Output Summary (Last 24 hours) at 07/20/2021 1219 Last data filed at 07/20/2021 0743 Gross per 24 hour  Intake 300 ml  Output 200 ml  Net 100 ml        Physical Exam: Vital Signs Blood pressure (!) 137/48, pulse 75, temperature (!) 97.5 F (36.4 C), temperature source Oral, resp. rate 18, height 4\' 11"  (1.499 m), weight 72.5 kg, SpO2 97 %. Constitutional: No distress . Vital signs reviewed. HEENT: NCAT, EOMI, oral membranes moist Neck: supple Cardiovascular: RRR without murmur. No JVD    Respiratory/Chest: CTA Bilaterally without wheezes or rales. Normal effort    GI/Abdomen: BS +, non-tender, non-distended Ext: no clubbing, cyanosis, 1+ LE edema Psych: pleasant and cooperative, anxious  Skin: Clean and intact without signs of breakdown, scattered ecchymoses and old blisters on legs.  Neuro:  Alert and oriented x 3. Normal insight and awareness. Intact Memory. Normal language and speech. Cranial nerve exam unremarkable. UE 5/5. LE limited proximally by pain, 2-3/5 to 4/5 distally Musculoskeletal: R>L pelvic pain with bed mobility GU: on commode, having difficulty urinating   Assessment/Plan: 1. Functional deficits which require 3+ hours per day of interdisciplinary therapy in a comprehensive inpatient rehab  setting. Physiatrist is providing close team supervision and 24 hour management of active medical problems listed below. Physiatrist and rehab team continue to assess barriers to discharge/monitor patient progress toward functional and medical goals  Care Tool:  Bathing    Body parts bathed by patient: Right arm, Left arm, Chest, Abdomen, Right upper leg, Left upper leg, Right lower leg, Left lower leg, Face   Body parts bathed by helper: Front perineal area, Buttocks     Bathing assist Assist Level: Minimal Assistance - Patient > 75%     Upper Body Dressing/Undressing Upper body dressing   What is the patient wearing?: Pull over shirt    Upper body assist Assist Level: Set up assist    Lower Body Dressing/Undressing Lower body dressing      What is the patient wearing?: Pants, Underwear/pull up     Lower body assist Assist for lower body dressing: Minimal Assistance - Patient > 75%     Toileting Toileting    Toileting assist Assist for toileting: Moderate Assistance - Patient 50 - 74%     Transfers Chair/bed transfer  Transfers assist     Chair/bed transfer assist level: Contact Guard/Touching assist     Locomotion Ambulation   Ambulation assist  Assist level: Minimal Assistance - Patient > 75% Assistive device: Walker-rolling Max distance: 51ft   Walk 10 feet activity   Assist     Assist level: Minimal Assistance - Patient > 75% Assistive device: Walker-rolling   Walk 50 feet activity   Assist Walk 50 feet with 2 turns activity did not occur: Safety/medical concerns (pain)         Walk 150 feet activity   Assist Walk 150 feet activity did not occur: Safety/medical concerns (pain)         Walk 10 feet on uneven surface  activity   Assist Walk 10 feet on uneven surfaces activity did not occur: Safety/medical concerns (balance deficits)         Wheelchair     Assist Is the patient using a wheelchair?: Yes Type of  Wheelchair: Manual    Wheelchair assist level: Minimal Assistance - Patient > 75% Max wheelchair distance: 150    Wheelchair 50 feet with 2 turns activity    Assist        Assist Level: Minimal Assistance - Patient > 75%   Wheelchair 150 feet activity     Assist      Assist Level: Minimal Assistance - Patient > 75%   Blood pressure (!) 137/48, pulse 75, temperature (!) 97.5 F (36.4 C), temperature source Oral, resp. rate 18, height 4\' 11"  (1.499 m), weight 72.5 kg, SpO2 97 %. Medical Problem List and Plan: 1. Functional deficits secondary to right superior and inferior pubic ramus fractures of unclear etiology. Pt denies recent fall.   -Partial weightbearing RLE due to close proximity of SPR fx to acetabulum             -patient may shower             -ELOS/Goals: 10-14 days, goals supervision with PT, sup/min OT  -Continue CIR therapies including PT, OT   -told pt she was free to shift/move in bed as she tolerates  HFU scheduled 2.  Antithrombotics: -DVT/anticoagulation:  Pharmaceutical: Lovenox.Vascular study negative             -antiplatelet therapy: Aspirin 81 mg daily 3. Pain Management: Hydrocodone as needed 4. Mood/anxiety disorder: Ativan 0.5 mg daily as needed, pt used this at home             -low dose trazodone if needed for sleep as well  -needs regular positive reinforcement             -antipsychotic agents: N/A 5. Neuropsych: This patient is capable of making decisions on her own behalf. 6. Skin/Wound Care: Routine skin checks 7. Fluids/Electrolytes/Nutrition: Routine in and outs with follow-up chemistries 8.  AKI: Cr reviewed 1/30: normalized.  9.  PAF/SSS/PPM.  Cardizem 180 mg daily, Toprol 25 mg nightly.  Follow-up per cardiology services. Reviewed current medications and compared to home medications- will discuss with patient tomorrow.              -monitor activity tolerance with therapies 10.  Hyperlipidemia.  Pravachol 11.  GERD.   Protonix 12. Suboptimal potassium: supplement 71meq once today.  13. Bilateral lower extremity edema: elevate legs      LOS: 3 days A FACE TO FACE EVALUATION WAS PERFORMED  [name] P [name] 07/20/2021, 12:19 PM

## 2021-07-20 NOTE — Progress Notes (Signed)
Occupational Therapy Session Note  Patient Details  Name: [name] MRN: [id number] Date of Birth: [birth year]  Today's Date: 07/20/2021 OT Individual Time: 5093-2671 & 2458-0998 OT Individual Time Calculation (min): 53 min & 53 min   Short Term Goals: Week 1:  OT Short Term Goal 1 (Week 1): STG = LTG due to ELOS  Skilled Therapeutic Interventions/Progress Updates:  Session 1 Skilled OT intervention completed with focus on ADL retraining, AE education, dynamic balance, functional transfers. Pt received seated in w/c agreeable to session. Pt requesting to get dressed. Pt unable to recall PWB precautions, with mod cues needed to recall and maintain throughout session, however by end of session, assist for maintaining precautions during stands/pivots fading to supervision. Pt demonstrating slight cognitive deficits this session, with re-asking same questions and with pt demonstrating decreased recall of cues during session especially with technique of standing from w/c. LPN in room to administer meds. Educated pt on AE including LH sponge (issued pt one for use) and reacher. Pt sit > stand using RW with min A during entire session, and therapist placing foot under pt's R foot to prevent heel from dropping for adherence/to ensure PWB at 50%. Pt able to doff underwear with CGA-min A for balance. Pt donning new underwear and pants with use of reacher after demonstration and cues provided however pt able to complete with supervision for threading. Pt donning/doffing shirt with set up A seated. While in stance, pt noticeably shaking with pt stating "I feel weak like and kind of nervous getting up, but not dizzy." Assessed pt's BP seated in w/c- 160/63, then in standing- 135/112. Pt encouraged to drink plenty of fluids, educated on orthostatics that could come from laying in bed in acute, and therapist notifying next therapist to donn TEDS prior to start of session to assist in controlling pt's orthostatic  BP. Nurse notified of pt's elevated BP. Pt sit > stand and stand pivot with min A and R heel block for WB adherence to recliner, with pt left seated in recliner, with chair alarm on and all needs in reach at end of session.  Session 2 Skilled OT intervention completed with focus on functional transfers, toileting, dynamic standing balance and standing tolerance. Pt received seated in recliner, agreeable to session. Note- pt with a little swelling in both ankles and educated pt on MD orders for TED hose, however pt declining donning them. Pt requesting to use bathroom, with pt sit > stand using RW with supervision, and pt requiring cues to only PWB at 50% vs full WB in the RLE. Pt demonstrating impaired carryover of education provided by both PT/OT on how to maintain PWB precaution with keeping either toes or heel up during transfers, with pt unable to remember and mod cues required during session. Pt ambulating with RW short distance to [facility] in room, with CGA. Pt required increased time to void on [facility], however continent with minimal voiding. Pt completed toileting with supervision at the sit > stand level. Pt completed ambulatory transfer to w/c about 15 ft in room with CGA, then transported pt from room <> day room with total A for energy conservation. While standing at elevated table with pt using RW for balance and therapist providing supervision, pt participated in cognitive/sequencing tasks including color gliding board and pipe tower. Pt with no difficulty with color sequencing board, however with min difficulty with pipe tower but was able to self-correct errors with min guidance. Pt stating that she has noticed her memory and  problem solving skills are not as good as they used to be. Pt required seated rest breaks throughout due to fatigue however with no "weak" feeling this session. Pt completed BUE strengthening exercises seated in w/c including the following to promote functional endurance for self-care  tasks:  3 pound dowel: Bicep flexion x10 Chest presses x10 Overhead presses x10  Pt required cues for form and technique as well as rest breaks due to fatigue. Pt sit > stand and short ambulatory transfer using RW all with supervision to recliner. Pt left seated in recliner with chair alarm on and all needs in reach at end of session.   Therapy Documentation Precautions:  Precautions Precautions: Fall Restrictions Weight Bearing Restrictions: Yes RLE Weight Bearing: Partial weight bearing RLE Partial Weight Bearing Percentage or Pounds: 50  Pain: 1st session 2/10 pain in R hip, declined meds or intervention  2nd session  No c/o pain   Therapy/Group: Individual Therapy  Kentley Blyden E Hilmar Moldovan 07/20/2021, 7:26 AM

## 2021-07-20 NOTE — Progress Notes (Signed)
Inpatient [facility] Individual Statement of Services  Patient Name:  [name]  Date:  07/20/2021  Welcome to the [facility].  Our goal is to provide you with an individualized program based on your diagnosis and situation, designed to meet your specific needs.  With this comprehensive rehabilitation program, you will be expected to participate in at least 3 hours of rehabilitation therapies Monday-Friday, with modified therapy programming on the weekends.  Your rehabilitation program will include the following services:  Physical Therapy (PT), Occupational Therapy (OT), Speech Therapy (ST), 24 hour per day rehabilitation nursing, Therapeutic Recreaction (TR), Neuropsychology, Care Coordinator, Rehabilitation Medicine, Nutrition Services, Pharmacy Services, and Other  Weekly team conferences will be held on Wednesdays to discuss your progress.  Your Inpatient Rehabilitation Care Coordinator will talk with you frequently to get your input and to update you on team discussions.  Team conferences with you and your family in attendance may also be held.  Expected length of stay:  10-14 Days  Overall anticipated outcome: Supervision to Min A  Depending on your progress and recovery, your program may change. Your Inpatient Rehabilitation Care Coordinator will coordinate services and will keep you informed of any changes. Your Inpatient Rehabilitation Care Coordinator's name and contact numbers are listed  below.  The following services may also be recommended but are not provided by the [facility]:   [place] will be made to provide these services after discharge if needed.  Arrangements include referral to agencies that provide these services.  Your insurance has been verified to be:  Medicare A & B Your primary doctor is:  Billey Chang, MD  Pertinent  information will be shared with your doctor and your insurance company.  Inpatient Rehabilitation Care Coordinator:  Erlene Quan, [place] or [phone]  Information discussed with and copy given to patient by: Dyanne Iha, 07/20/2021, 11:58 AM

## 2021-07-20 NOTE — Progress Notes (Signed)
Inpatient Rehabilitation Care Coordinator Assessment and Plan Patient Details  Name: [name] MRN: [id number] Date of Birth: 07/03/1931  Today's Date: 07/20/2021  Hospital Problems: Principal Problem:   Pelvic fracture [facility])  Past Medical History:  Past Medical History:  Diagnosis Date   Allergic rhinitis    Anginal pain (HCC)    Asthmatic bronchitis    Chronic cough    Dysrhythmia    PAF   Esophageal reflux    GERD (gastroesophageal reflux disease)    History of stress test 11/20/2009   This was essentially normal,post-stress EF was hyperdynamic at 73 beats per mins, There was no scar or ischemia.   Hx of echocardiogram 11/05/2010   EF>55% showed a normal systolic function with impaired diastolic dysfuntion and also tissue doppler suggesting increased elevated left atrial pressure, she had moderate LA dilatation and mild LA dilatation. There was calcified mitral apparatus with moderate MR, mild to moderate TR and trace aortic insufficiency.   Hyperlipemia    Hypertension    Hypothyroidism    PAF (paroxysmal atrial fibrillation) ([place])    Pneumonia    Past Surgical History:  Past Surgical History:  Procedure Laterality Date   CHOLECYSTECTOMY     CYSTOSCOPY WITH BIOPSY N/A 08/29/2020   Procedure: CYSTOSCOPY WITH BIOPSY WITH FULGURATION;  Surgeon: Irine Seal, MD;  Location: [facility];  Service: Urology;  Laterality: N/A;   FOOT SURGERY     right   NASAL SEPTUM SURGERY     PACEMAKER INSERTION  June 1st 2009   [employer] [place] device, model #5826, serial #[id number], The artial lead is a Interior and spatial designer, the ventricular lead is Environmental education officer.   PPM GENERATOR CHANGEOUT N/A 02/09/2017   Procedure: PPM GENERATOR CHANGEOUT;  Surgeon: Sanda Klein, MD;  Location: [facility];  Service: Cardiovascular;  Laterality: N/A;   THYROID SURGERY     TONSILLECTOMY     TONSILLECTOMY     VESICOVAGINAL FISTULA CLOSURE W/ TAH     Social History:  reports that she has never smoked. She  has never used smokeless tobacco. She reports that she does not drink alcohol and does not use drugs.  Family / Support Systems Children: Shanon Brow (Son) Anticipated Caregiver: Lives with son. Daughter able to come from [place] to asssit Ability/Limitations of Caregiver: none Caregiver Availability: 24/7 Family Dynamics: support from son and daughter  Social History Preferred language: English Religion: Lobbyist Issues: n/a Guardian/Conservator: Lou Cal   Abuse/Neglect Abuse/Neglect Assessment Can Be Completed: Yes Physical Abuse: Denies Verbal Abuse: Denies Sexual Abuse: Denies Exploitation of patient/patient's resources: Denies Self-Neglect: Denies  Patient response to: Social Isolation - How often do you feel lonely or isolated from those around you?: Rarely  Emotional Status Recent Psychosocial Issues: coping Psychiatric History: n/a Substance Abuse History: n/a  Patient / Family Perceptions, Expectations & Goals Pt/Family understanding of illness & functional limitations: yes Premorbid pt/family roles/activities: functional decline. Using RW and WC. Requiring some assistance with ADLS, mobility, and cognition tasks Anticipated changes in roles/activities/participation: son and daughter able to assist with roles and tasks Pt/family expectations/goals: supervision/Min [place]: None Premorbid Home Care/DME Agencies: Other (Comment) Librarian, academic, Sports coach) Transportation available at discharge: son able to transport Is the patient able to respond to transportation needs?: Yes In the past 12 months, has lack of transportation kept you from medical appointments or from getting medications?: No In the past 12 months, has lack of transportation kept you from meetings, work,  or from getting things needed for daily living?: No Resource referrals recommended: Neuropsychology  Discharge  Planning Living Arrangements: Children Support Systems: Children Type of Residence: Private residence Insurance Resources: Medicare Financial Screen Referred: No Living Expenses: Lives with family Money Management: Patient, Family Does the patient have any problems obtaining your medications?: No Home Management: required some assistance Patient/Family Preliminary Plans: children able to assist Care Coordinator Anticipated Follow Up Needs: HH/OP Expected length of stay: 10-14 Days  Clinical Impression SW spoke with patient son, introduced self, explained tole and addressed questions. Sw will cont to follow up.   Dyanne Iha 07/20/2021, 2:58 PM

## 2021-07-21 DIAGNOSIS — N179 Acute kidney failure, unspecified: Secondary | ICD-10-CM | POA: Diagnosis not present

## 2021-07-21 DIAGNOSIS — S32599S Other specified fracture of unspecified pubis, sequela: Secondary | ICD-10-CM | POA: Diagnosis not present

## 2021-07-21 DIAGNOSIS — S32609S Unspecified fracture of unspecified ischium, sequela: Secondary | ICD-10-CM | POA: Diagnosis not present

## 2021-07-21 MED ORDER — MAGNESIUM HYDROXIDE 400 MG/5ML PO SUSP: 30.0000 mL | Freq: Every day | ORAL | Status: DC | PRN

## 2021-07-21 MED ORDER — METOPROLOL SUCCINATE ER 25 MG PO TB24
12.5000 mg | ORAL_TABLET | Freq: Every day | ORAL | Status: DC
  Administered 2021-07-21 – 2021-07-24 (×4): 12.5 mg via ORAL
  Filled 2021-07-21 (×4): qty 1

## 2021-07-21 NOTE — Progress Notes (Addendum)
I have read and reviewed the attached note and am in agreement with the documentation provided.           This licensed clinician was present and actively directing care throughout the  session at all times.        Judieth Keens PT, DPT      Physical Therapy Session Note  Patient Details  Name: [name] MRN: [id number] Date of Birth: 1932/01/13  Today's Date: 07/21/2021 PT Individual Time: 0800-0858 PT Individual Time Calculation (min): 58 min   Short Term Goals: Week 1:  PT Short Term Goal 1 (Week 1): = LTGs due to ELOS  Skilled Therapeutic Interventions/Progress Updates:    Pt met sitting on BSC and completed peri-care w/ S. Pt transferred from [facility] to recliner w/ RW S. Pt reports R hip soreness during WB throughout session but not painful. Pt educated on modified 3 point step to gait w/ RW due to WB status. Pt voiced and demonstrated RLE PWB status during ambulation from recliner to w/c w/ RW CGA ~30f. All ambulation completed w/ RW CGA throughout session. Pt wheeled to 94M rehab gym for time, transferred stand pivot to mat table w/ RW CGA. Completed standing reaching forward to grab weighted 1lb ball outside BoS then placing ball to L outside BoS 1x7. Required seated rest break after set, then completed 1x7 w/ 2lb ball. Morning MD visit after 2nd set and pt stood w/ RW during visit. Pt completed ambulating in 2x248fsquare w/ R turns, seated rest break, then L turns to facilitate weight shift during 90 degree turns. Pt required occasional VC to maintain step to gait pattern due to inadvertent non-compliance of WB status. Pt demonstrated appropriate weight shift on L to maintain WB status during turns and reaching outside BoS throughout session. Pt wheeled back to room for time and increased R hip soreness. Pt transferred to recliner, posey belt on, call bell next to pt, and all needs met.  Therapy Documentation Precautions:  Precautions Precautions: Fall Restrictions Weight Bearing  Restrictions: Yes RLE Weight Bearing: Partial weight bearing RLE Partial Weight Bearing Percentage or Pounds: 50 General:     Therapy/Group: Individual Therapy  CoLucile ShuttersSPT  07/21/2021, 12:30 PM

## 2021-07-21 NOTE — Progress Notes (Signed)
PROGRESS NOTE   Subjective/Complaints: Will replace sorbitol with milk of magnesia for constipation given its cardiac benefits Discussed that she is currently on her home cardiac medications  ROS: Patient denies fever, rash, sore throat, blurred vision, dizziness, nausea, vomiting, diarrhea, cough, shortness of breath or chest pain, +difficulty urinating, +pelvic pain   Objective:   No results found. Recent Labs    07/20/21 0635  WBC 7.8  HGB 10.5*  HCT 31.8*  PLT 225   Recent Labs    07/20/21 0635  NA 138  K 3.5  CL 101  CO2 29  GLUCOSE 99  BUN 20  CREATININE 0.92  CALCIUM 8.6*    Intake/Output Summary (Last 24 hours) at 07/21/2021 1058 Last data filed at 07/21/2021 0654 Gross per 24 hour  Intake 716 ml  Output --  Net 716 ml        Physical Exam: Vital Signs Blood pressure (!) 125/41, pulse 71, temperature 97.7 F (36.5 C), temperature source Oral, resp. rate 16, height 4\' 11"  (1.499 m), weight 72.5 kg, SpO2 98 %. Gen: no distress, normal appearing HEENT: oral mucosa pink and moist, NCAT Cardio: Reg rate Chest: normal effort, normal rate of breathing Abd: soft, non-distended  Ext: no clubbing, cyanosis, 1+ LE edema Psych: pleasant and cooperative, anxious  Skin: Clean and intact without signs of breakdown, scattered ecchymoses and old blisters on legs.  Neuro:  Alert and oriented x 3. Normal insight and awareness. Intact Memory. Normal language and speech. Cranial nerve exam unremarkable. UE 5/5. LE limited proximally by pain, 2-3/5 to 4/5 distally Musculoskeletal: R>L pelvic pain with bed mobility GU: on commode, having difficulty urinating   Assessment/Plan: 1. Functional deficits which require 3+ hours per day of interdisciplinary therapy in a comprehensive inpatient rehab setting. Physiatrist is providing close team supervision and 24 hour management of active medical problems listed  below. Physiatrist and rehab team continue to assess barriers to discharge/monitor patient progress toward functional and medical goals  Care Tool:  Bathing    Body parts bathed by patient: Right arm, Left arm, Chest, Abdomen, Right upper leg, Left upper leg, Right lower leg, Left lower leg, Face   Body parts bathed by helper: Front perineal area, Buttocks     Bathing assist Assist Level: Minimal Assistance - Patient > 75%     Upper Body Dressing/Undressing Upper body dressing   What is the patient wearing?: Pull over shirt    Upper body assist Assist Level: Set up assist    Lower Body Dressing/Undressing Lower body dressing      What is the patient wearing?: Pants, Underwear/pull up     Lower body assist Assist for lower body dressing: Minimal Assistance - Patient > 75%     Toileting Toileting    Toileting assist Assist for toileting: Contact Guard/Touching assist     Transfers Chair/bed transfer  Transfers assist     Chair/bed transfer assist level: Contact Guard/Touching assist     Locomotion Ambulation   Ambulation assist      Assist level: Minimal Assistance - Patient > 75% Assistive device: Walker-rolling Max distance: 6ft   Walk 10 feet activity   Assist  Assist level: Minimal Assistance - Patient > 75% Assistive device: Walker-rolling   Walk 50 feet activity   Assist Walk 50 feet with 2 turns activity did not occur: Safety/medical concerns (pain)         Walk 150 feet activity   Assist Walk 150 feet activity did not occur: Safety/medical concerns (pain)         Walk 10 feet on uneven surface  activity   Assist Walk 10 feet on uneven surfaces activity did not occur: Safety/medical concerns (balance deficits)         Wheelchair     Assist Is the patient using a wheelchair?: Yes Type of Wheelchair: Manual    Wheelchair assist level: Minimal Assistance - Patient > 75% Max wheelchair distance: 150     Wheelchair 50 feet with 2 turns activity    Assist        Assist Level: Minimal Assistance - Patient > 75%   Wheelchair 150 feet activity     Assist      Assist Level: Minimal Assistance - Patient > 75%   Blood pressure (!) 125/41, pulse 71, temperature 97.7 F (36.5 C), temperature source Oral, resp. rate 16, height 4\' 11"  (1.499 m), weight 72.5 kg, SpO2 98 %. Medical Problem List and Plan: 1. Functional deficits secondary to right superior and inferior pubic ramus fractures of unclear etiology. Pt denies recent fall.   -Partial weightbearing RLE due to close proximity of SPR fx to acetabulum             -patient may shower             -ELOS/Goals: 10-14 days, goals supervision with PT, sup/min OT  Continue CIR therapies including PT, OT   -told pt she was free to shift/move in bed as she tolerates  HFU scheduled 2.  Antithrombotics: -DVT/anticoagulation:  Pharmaceutical: Lovenox.Vascular study negative             -antiplatelet therapy: Aspirin 81 mg daily 3. Pelvic fracture pain: Hydrocodone as needed. Offered scheduling tylenol but she prefers to use as needed 4. Mood/anxiety disorder: Ativan 0.5 mg daily as needed, pt used this at home             -low dose trazodone if needed for sleep as well  -needs regular positive reinforcement             -antipsychotic agents: N/A 5. Neuropsych: This patient is capable of making decisions on her own behalf. 6. Skin/Wound Care: Routine skin checks 7. Fluids/Electrolytes/Nutrition: Routine in and outs with follow-up chemistries 8.  AKI: Cr reviewed 1/30: normalized.  9.  PAF/SSS/PPM.  Cardizem 180 mg daily, Toprol 25 mg nightly.  Follow-up per cardiology services. Reviewed current medications and compared to home medications- discussed with patient that she is on her home meds.              -monitor activity tolerance with therapies 10.  Hyperlipidemia.  Pravachol 11.  GERD.  Protonix 12. Suboptimal potassium:  supplement 31meq once today.  13. Bilateral lower extremity edema: elevate legs 14. Hypotension to diastolic 41: decrease lopressor to 12.5mg  HS.       LOS: 4 days A FACE TO FACE EVALUATION WAS PERFORMED  [name] 07/21/2021, 10:58 AM

## 2021-07-21 NOTE — Progress Notes (Signed)
Occupational Therapy Session Note  Patient Details  Name: [name] MRN: [id number] Date of Birth: [birth year]  Today's Date: 07/21/2021 OT Individual Time: 1050-1130 OT Individual Time Calculation (min): 40 min    Short Term Goals: Week 1:  OT Short Term Goal 1 (Week 1): STG = LTG due to ELOS  Skilled Therapeutic Interventions/Progress Updates:  Skilled OT intervention completed with focus on DME/shower management education and shower transfers. Pt received seated in recliner, agreeable to session. Discussed pt's shower set up and strategies for transferring with education provided on installing in-wall grab bars vs suction, and using shower chair for seated bathing for PWB status and energy conservation. Encouraged pt to try shower transfer this session and pt completing SLOW ambulatory transfer using RW and CGA from recliner <> bathroom entrance. Pt c/o increased soreness, however declined taking seated rest break. Educated pt on side stepping method with RW and leading with her L side for entering her doorway to her bathroom at home due to size of home doorway. Pt able to completed side step entrance using RW, requiring CGA-min A for balance and mod cues for technique as pt was little uncomfortable with this method. Pt able to again side step with use of grab bars <> BSC in shower to simulate shower seat, with CGA. Pt with mod difficulty lifting BLEs over simulated threshold for entering and exiting showering with plan to further practice this for more confidence. Pt left seated in recliner, with chair alarm on, BLEs elevated and all needs in reach at end of session.   Therapy Documentation Precautions:  Precautions Precautions: Fall Restrictions Weight Bearing Restrictions: Yes RLE Weight Bearing: Partial weight bearing RLE Partial Weight Bearing Percentage or Pounds: 50  Pain: Unrated pain in R groin, declined intervention, however therapist offered multiple rest breaks throughout  during transfers   Therapy/Group: Individual Therapy  Samiyyah Moffa E Delane Wessinger 07/21/2021, 7:51 AM

## 2021-07-21 NOTE — Progress Notes (Signed)
Physical Therapy Session Note  Patient Details  Name: [name] MRN: [id number] Date of Birth: April 17, 1932  Today's Date: 07/21/2021 PT Individual Time: 1400-1456 PT Individual Time Calculation (min): 56 min   Short Term Goals: Week 1:  PT Short Term Goal 1 (Week 1): = LTGs due to ELOS  Skilled Therapeutic Interventions/Progress Updates:   Received pt sitting in recliner, pt agreeable to PT treatment, and denied any pain during session just "soreness" in R hip. Session with emphasis on functional mobility/transfers, generalized strengthening, and gait training. Noted edema in BLE but pt declining ted hose - replaced yellow non-skid socks with larger grey pair. Sit<>stand with RW and CGA and pt ambulated 23ft with RW and CGA to WC with cues for RLE 50% PWB status. Pt performed WC mobility 58ft using BUE and supervision with max cues for propulsion technique and stride - limited by fatigue. Sit<>stand with RW and CGA and practiced side stepping with RW 58ft to L and 36ft to R x 2 trials with RW and CGA with mod/max cues for adherence to RLE 50% PWB status and RW management. Emphasized importance of heavy reliance on UE use and lifting heel from ground while balancing on toes. Discussed again, stair navigation, as pt with decreased awareness of difficulty of task with PWB status and only 1 railing (pt originally stated she would be able to use 2 but now thinks they are too far apart). Demonstrated technique for navigating steps forwards and backwards with 2 rails while maintaining precautions and used opportunity to show pt physical difficulty of task. Pt ambulated to bottom of staircase and tried placing UE's on rails and on RW, but ultimately unable to lift either LE to first step while adhering to [facility] precautions. Therefore re-emphasized need for ramp or ambulance transport home - pt will speak with family regarding this. Returned to room and pt transferred WC<>regular toilet with RW and CGA and able to  manage clothing with CGA. Pt continent of bladder and performed peri-care standing with CGA. Pt ambulated 58ft with RW and CGA to recliner. Concluded session with pt sitting in recliner, needs within reach, and chair pad alarm on.   Therapy Documentation Precautions:  Precautions Precautions: Fall Restrictions Weight Bearing Restrictions: Yes RLE Weight Bearing: Partial weight bearing RLE Partial Weight Bearing Percentage or Pounds: 50  Therapy/Group: Individual Therapy Alfonse Alpers PT, DPT   07/21/2021, 7:44 AM

## 2021-07-21 NOTE — Progress Notes (Signed)
Occupational Therapy Session Note  Patient Details  Name: [name] MRN: [id number] Date of Birth: [birth year]  Today's Date: 07/21/2021 OT Individual Time: 2524-7998 OT Individual Time Calculation (min): 54 min    Short Term Goals: Week 1:  OT Short Term Goal 1 (Week 1): STG = LTG due to ELOS  Skilled Therapeutic Interventions/Progress Updates:    Pt received in recliner, 3/10 pain in her pelvis described as soreness, no request for intervention. Pt completed sit > stand from the recliner with the RW with close (S). Ambulatory transfer to the w/c with CGA, cueing for PWB RLE, moderate adherence. Pt was taken to the therapy gym where she preferred to work on UB d/t soreness in her legs. She began with trials on the BUE ergometer, used to challenge UE strengthening and endurance. Level 1.5 for two 3 min trials. Pt then completed a BUE strengthening circuit with a 3 lb dowel, focusing on shoulder press, chest press, bicep curl, and B row. 3x10 repetitions. Problem solved through home equipment and d/c planning. Pt reporting shower chair will fit better than TTB, recommended that pt wait for HHOT to complete first shower d/t her not being comfortable with her son helping her. Pt returned to her room and was left sitting in recliner with all needs met. Chair pad alarm set.   Therapy Documentation Precautions:  Precautions Precautions: Fall Restrictions Weight Bearing Restrictions: Yes RLE Weight Bearing: Partial weight bearing RLE Partial Weight Bearing Percentage or Pounds: 50  Therapy/Group: Individual Therapy  Curtis Sites 07/21/2021, 6:30 AM

## 2021-07-21 NOTE — Progress Notes (Signed)
Sorbitol given on previous shift. 4 small hard stools last night. Suggested supp., but patient wants to wait until after therapy today. [name]

## 2021-07-22 ENCOUNTER — Other Ambulatory Visit (HOSPITAL_COMMUNITY): Payer: Medicare Other

## 2021-07-22 MED ORDER — ACETAMINOPHEN 325 MG PO TABS
650.0000 mg | ORAL_TABLET | Freq: Three times a day (TID) | ORAL | Status: DC
  Administered 2021-07-22 – 2021-07-25 (×8): 650 mg via ORAL
  Filled 2021-07-22 (×9): qty 2

## 2021-07-22 NOTE — Progress Notes (Signed)
PROGRESS NOTE   Subjective/Complaints: Complains of pain from pelvic fracture- most sore on her right side. Kept her from sleeping well last night but she does not feel she needs a sleep aide as she is usually a good sleeper.   ROS: Patient denies fever, rash, sore throat, blurred vision, dizziness, nausea, vomiting, diarrhea, cough, shortness of breath or chest pain, +difficulty urinating, +pelvic pain, +insomnia   Objective:   No results found. Recent Labs    07/20/21 0635  WBC 7.8  HGB 10.5*  HCT 31.8*  PLT 225   Recent Labs    07/20/21 0635  NA 138  K 3.5  CL 101  CO2 29  GLUCOSE 99  BUN 20  CREATININE 0.92  CALCIUM 8.6*    Intake/Output Summary (Last 24 hours) at 07/22/2021 1033 Last data filed at 07/22/2021 0759 Gross per 24 hour  Intake 776 ml  Output --  Net 776 ml        Physical Exam: Vital Signs Blood pressure (!) 133/47, pulse 75, temperature 98.1 F (36.7 C), temperature source Oral, resp. rate 16, height 4\' 11"  (1.499 m), weight 72.5 kg, SpO2 97 %. Gen: no distress, normal appearing, BMI 32.28 HEENT: oral mucosa pink and moist, NCAT Cardio: Reg rate Chest: normal effort, normal rate of breathing Abd: soft, non-distended  Ext: no clubbing, cyanosis, 1+ LE edema Psych: pleasant and cooperative, anxious  Skin: Clean and intact without signs of breakdown, scattered ecchymoses and old blisters on legs.  Neuro:  Alert and oriented x 3. Normal insight and awareness. Intact Memory. Normal language and speech. Cranial nerve exam unremarkable. UE 5/5. LE limited proximally by pain, 2-3/5 to 4/5 distally Musculoskeletal: R>L pelvic pain with bed mobility GU: on commode, having difficulty urinating   Assessment/Plan: 1. Functional deficits which require 3+ hours per day of interdisciplinary therapy in a comprehensive inpatient rehab setting. Physiatrist is providing close team supervision and 24 hour  management of active medical problems listed below. Physiatrist and rehab team continue to assess barriers to discharge/monitor patient progress toward functional and medical goals  Care Tool:  Bathing    Body parts bathed by patient: Right arm, Left arm, Chest, Abdomen, Right upper leg, Left upper leg, Right lower leg, Left lower leg, Face, Front perineal area, Buttocks   Body parts bathed by helper: Front perineal area, Buttocks     Bathing assist Assist Level: Contact Guard/Touching assist     Upper Body Dressing/Undressing Upper body dressing   What is the patient wearing?: Pull over shirt    Upper body assist Assist Level: Set up assist    Lower Body Dressing/Undressing Lower body dressing      What is the patient wearing?: Underwear/pull up, Pants     Lower body assist Assist for lower body dressing: Supervision/Verbal cueing     Toileting Toileting    Toileting assist Assist for toileting: Contact Guard/Touching assist     Transfers Chair/bed transfer  Transfers assist     Chair/bed transfer assist level: Contact Guard/Touching assist     Locomotion Ambulation   Ambulation assist      Assist level: Minimal Assistance - Patient > 75% Assistive device: Walker-rolling  Max distance: 65ft   Walk 10 feet activity   Assist     Assist level: Minimal Assistance - Patient > 75% Assistive device: Walker-rolling   Walk 50 feet activity   Assist Walk 50 feet with 2 turns activity did not occur: Safety/medical concerns (pain)         Walk 150 feet activity   Assist Walk 150 feet activity did not occur: Safety/medical concerns (pain)         Walk 10 feet on uneven surface  activity   Assist Walk 10 feet on uneven surfaces activity did not occur: Safety/medical concerns (balance deficits)         Wheelchair     Assist Is the patient using a wheelchair?: Yes Type of Wheelchair: Manual    Wheelchair assist level: Minimal  Assistance - Patient > 75% Max wheelchair distance: 150    Wheelchair 50 feet with 2 turns activity    Assist        Assist Level: Minimal Assistance - Patient > 75%   Wheelchair 150 feet activity     Assist      Assist Level: Minimal Assistance - Patient > 75%   Blood pressure (!) 133/47, pulse 75, temperature 98.1 F (36.7 C), temperature source Oral, resp. rate 16, height 4\' 11"  (1.499 m), weight 72.5 kg, SpO2 97 %. Medical Problem List and Plan: 1. Functional deficits secondary to right superior and inferior pubic ramus fractures of unclear etiology. Pt denies recent fall.   -Partial weightbearing RLE due to close proximity of SPR fx to acetabulum             -patient may shower             -ELOS/Goals: 10-14 days, goals supervision with PT, sup/min OT  Continue CIR therapies including PT, OT   -told pt she was free to shift/move in bed as she tolerates  HFU scheduled 2.  Antithrombotics: -DVT/anticoagulation:  Pharmaceutical: Lovenox.Vascular study negative             -antiplatelet therapy: Aspirin 81 mg daily 3. Pelvic fracture pain: Hydrocodone as needed. Offered scheduling tylenol but she prefers to use as needed 4. Mood/anxiety disorder: Ativan 0.5 mg daily as needed, pt used this at home             -low dose trazodone if needed for sleep as well  -needs regular positive reinforcement             -antipsychotic agents: N/A 5. Neuropsych: This patient is capable of making decisions on her own behalf. 6. Skin/Wound Care: Routine skin checks 7. Fluids/Electrolytes/Nutrition: Routine in and outs with follow-up chemistries 8.  AKI: Cr reviewed 1/30: normalized.  9.  PAF/SSS/PPM.  Cardizem 180 mg daily, Toprol 25 mg nightly.  Follow-up per cardiology services. Reviewed current medications and compared to home medications- discussed with patient that she is on her home meds.              -monitor activity tolerance with therapies 10.  Hyperlipidemia.   Pravachol 11.  GERD.  Protonix 12. Suboptimal potassium: supplemented, monitor as needed.  13. Bilateral lower extremity edema: elevate legs, apply compression garments and ice 14. Hypotension to diastolic 41: decrease lopressor to 12.5mg  HS, continue      LOS: 5 days A FACE TO FACE EVALUATION WAS PERFORMED  [name] 07/22/2021, 10:33 AM

## 2021-07-22 NOTE — Progress Notes (Signed)
Occupational Therapy Session Note  Patient Details  Name: [name] MRN: [id number] Date of Birth: [birth year]  Today's Date: 07/22/2021 OT Individual Time: 1112-1200 OT Individual Time Calculation (min): 48 min    Short Term Goals: Week 1:  OT Short Term Goal 1 (Week 1): STG = LTG due to ELOS Week 2:     Skilled Therapeutic Interventions/Progress Updates:    Patient was resting in recliner upon arrival, Patient indicated that she would like to go to the restroom.  Patient was able to verbalize and demonstrate WB restriction for the RLE while incorporating the RW.  The pt required vc's for safety awareness while using the RW for coming from sit to stand oppose to transitioning to the grab bar for safety and independence. The pt was able to doff her LB garments with S to [place] and she was able to complete toileting independently. The pt was able to come from sit to stand for donning her LB items with SBA and returned to her living quarters for washing her hands at sink LOF with S only using the RW for functional mobility while adhering to [facility] status.  The pt returned to the w/c incorporated relaxation breathing for greater compliance. The pt walked throughout her living quarters retrieving objects dispersed throughout the room on the floor using a long hand reacher using her RW and demonstrating good safety measures for adhering to WB status for the RLE using 1 vc.  The  patient was instructed on UB exercise to improve UB strength  using a 1lb dumb bell 2 sets of 10 for bicep curls, horizontal abduction, and shld flexion.  The pt indicated no pain response at the time of treatment, but did indicated that she was fatigued, she returned to her recliner, elevated BLE, bedside table was in place with the call light in reach, and all additional needs addressed.    Therapy Documentation Precautions:  Precautions Precautions: Fall Restrictions Weight Bearing Restrictions: Yes RLE Weight Bearing: Partial  weight bearing RLE Partial Weight Bearing Percentage or Pounds: 50%   Therapy/Group: Individual Therapy  Yvonne Kendall 07/22/2021, 12:35 PM

## 2021-07-22 NOTE — Plan of Care (Signed)
°  Problem: RH Stairs Goal: LTG Patient will ambulate up and down stairs w/assist (PT) Description: LTG: Patient will ambulate up and down # of stairs with assistance (PT) Outcome: Not Applicable Flowsheets (Taken 07/22/2021 0744) LTG: Pt will ambulate up/down stairs assist needed:: (D/C) -- Note: D/C

## 2021-07-22 NOTE — Patient Care Conference (Signed)
Inpatient RehabilitationTeam Conference and Plan of Care Update Date: 07/22/2021   Time: 11:29 AM    Patient Name: [name]      Medical Record Number: [id number]  Date of Birth: [birth year] Sex: Female         Room/Bed: 4W11C/4W11C-01 Payor Info: Payor: MEDICARE / Plan: MEDICARE PART A AND B / Product Type: *No Product type* /    Admit Date/Time:  07/17/2021 12:22 PM  Primary Diagnosis:  Pelvic fracture [facility])  Hospital Problems: Principal Problem:   Pelvic fracture [facility])    Expected Discharge Date: Expected Discharge Date: 07/25/21  Team Members Present: Physician leading conference: Dr. Leeroy Cha Social Worker Present: Erlene Quan, BSW Nurse Present: Dorien Chihuahua, RN PT Present: Becky Sax, PT OT Present: Laverle Hobby, OT PPS Coordinator present : Gunnar Fusi, SLP     Current Status/Progress Goal Weekly Team Focus  Bowel/Bladder   continent/ incontinent Will call for the bedside commode at night  toileting every 2 hours to gain bowel bladder continence  toileting every 2 hours bowel bladder training education   Swallow/Nutrition/ Hydration             ADL's   Min A bathing, supervision LB dressing at the sit > stand level with LH sponge, CGA toileting, CGA ambulatory transfers with RW  supervision  safety awareness, ADL retraining, home management education, BUE strengthening   Mobility   bed mobility supervision, transfers with RW CGA/min A, gait 58ft with RW CGA/min A  supervision  functional mobility/transfers, generalized strengthening, dynamic standing balance/coordination, gait training, endurance, D/C planning   Communication             Safety/Cognition/ Behavioral Observations            Pain   Pt denies any pain. Does have HX of Pelvic pain.  Pain remained controlled.  Pain interventions to reduce pain or discomfort.   Skin   WNL  Remain free from skin breakdown or pressure sores  Remain free from skin breakdown or pressure sores      Discharge Planning:  Continued planning   Team Discussion: Patient with pelvic pain post fracture; unable to maintain PWB status. Patient with poor insight and non compliance with recommendations for safety. MD adjusting medications due to hypotension. Constipation addressed.  Patient on target to meet rehab goals: Currently needs supervision for transfers and able to ambulate up to 6' with CGA. Needs CGA for bathing at the shower level and supervision for lower body care at the sink. Needs CGA for toileting. Goals for discharge set for supervision overall.  *See Care Plan and progress notes for long and short-term goals.   Revisions to Treatment Plan:  N/A  Teaching Needs: Safety, weight bearing precautions, medications, skin care, secondary risk management, transfers, toileting, etc  Current Barriers to Discharge: Decreased caregiver support, Home enviroment access/layout, and Behavior  Possible Resolutions to Barriers: Family education with son HH follow up services recommended Ramp recommended for entry to home; has steps with bil rails DME RW     Medical Summary Current Status: edema of bilateral lower extremiteis, partial weightbearing status due to her pelvic fracture, difficulty adhering to her precautions, labile BP  Barriers to Discharge: Medical stability  Barriers to Discharge Comments: edema of bilateral lower extremiteis, partial weightbearing status due to her pelvic fracture, difficulty adhering to her precautions, labile BP Possible Resolutions to [employer]: elevated legs, apply compression garments and ice, educate regarding weightbearing precations, add tylenol 650mg  TID, decrease lopressor to  12.5mg  HS   Continued Need for Acute Rehabilitation Level of Care: The patient requires daily medical management by a physician with specialized training in physical medicine and rehabilitation for the following reasons: Direction of a multidisciplinary  physical rehabilitation program to maximize functional independence : Yes Medical management of patient stability for increased activity during participation in an intensive rehabilitation regime.: Yes Analysis of laboratory values and/or radiology reports with any subsequent need for medication adjustment and/or medical intervention. : Yes   I attest that I was present, lead the team conference, and concur with the assessment and plan of the team.   Dorien Chihuahua B 07/22/2021, 2:07 PM

## 2021-07-22 NOTE — Progress Notes (Signed)
Physical Therapy Session Note  Patient Details  Name: [name] MRN: [id number] Date of Birth: 07/29/1931  Today's Date: 07/22/2021 PT Individual Time: 7482-7078 PT Individual Time Calculation (min): 56 min   Short Term Goals: Week 1:  PT Short Term Goal 1 (Week 1): = LTGs due to ELOS  Skilled Therapeutic Interventions/Progress Updates:     Pt received seated in recliner and agrees to therapy. Reports soreness and stiffness in R hip. Number not provided. PT provides rest breaks and mobility to manage pain. Pt is able to verbalize WB precautions prior to mobility. Pt performs sit to stand with CGA and stand step transfer to toilet with RW and CGA, with cues for RW management and positioning. Following toileting, pt transfers back to [facility] with RW. WC transport to gym for time management. Stand step transfer to Nustep with CGA and RW. Pt performs Nustep for strength and endurance training as well as gentle AROM of R lower extremity with hopes of improving pain levels. Pt completes for 10:00 at workload of 3 with average steps per minute ~25-30. Pt verbalizes increase in R knee pain with activity. Seated rest break prior to transfer to [facility] with RW. PT educates on correct sizing of RW and replaces pt's standard height RW with a junior RW for improved body mechanics and safety. Pt then ambulates x100' slowly with RW and CGA, with cues for upright gaze to improve posture and balance, and decreasing WB through arms for energy conservation. WC transport back to room. Stand step transfer to recliner with CGA. Left seated with alarm intact and all needs within reach.  Therapy Documentation Precautions:  Precautions Precautions: Fall Restrictions Weight Bearing Restrictions: Yes RLE Weight Bearing: Partial weight bearing RLE Partial Weight Bearing Percentage or Pounds: 50%  Therapy/Group: Individual Therapy  Breck Coons, PT, DPT 07/22/2021, 4:27 PM

## 2021-07-22 NOTE — Progress Notes (Signed)
Occupational Therapy Session Note  Patient Details  Name: [name] MRN: [id number] Date of Birth: 05-Nov-1931  Today's Date: 07/22/2021 OT Individual Time: 4917-9150 OT Individual Time Calculation (min): 55 min    Short Term Goals: Week 1:  OT Short Term Goal 1 (Week 1): STG = LTG due to ELOS  Skilled Therapeutic Interventions/Progress Updates:    Pt seated in relciner with 3/10 pain described as soreness in her R hip. She was agreeable to shower for pain relief. Ambulatory transfer into the bathroom with RW with CGA fading to (S). Vc for adherence to RLE PWB. Pt required CGA for doffing clothes and vc for safely sequencing steps. She washed UB with (S) and LB with CGA for standing balance safety. Shirt donned with (S) and LB with close (S). Extra time/effort to thread pants over BLE but no assist needed seated on the TTB. She required max A to don socks and shoes, reporting she does this from a low sofa at home. Ambulatory transfer back to the w/c at the sink with (S) using RW. Oral care and hair care with set up assist. Pt was left sitting up in the recliner with all needs met, chair alarm set.   Therapy Documentation Precautions:  Precautions Precautions: Fall Restrictions Weight Bearing Restrictions: Yes RLE Weight Bearing: Partial weight bearing RLE Partial Weight Bearing Percentage or Pounds: 50  Therapy/Group: Individual Therapy  Curtis Sites 07/22/2021, 6:21 AM

## 2021-07-22 NOTE — Progress Notes (Signed)
Physical Therapy Session Note  Patient Details  Name: [name] MRN: [id number] Date of Birth: 1931/07/12  Today's Date: 07/22/2021 PT Individual Time: 7673-4193 PT Individual Time Calculation (min): 55 min   Short Term Goals: Week 1:  PT Short Term Goal 1 (Week 1): = LTGs due to ELOS  Skilled Therapeutic Interventions/Progress Updates:   Received pt sitting in recliner, pt agreeable to PT treatment, and denied any pain during session but continues to c/o soreness in R hip. Session with emphasis on functional mobility/transfers, generalized strengthening, dynamic standing balance/coordination, gait training, stair navigation, and improved endurance with activity. Discussed equipment for D/C and pt reported not needing a WC upon D/C stating it won't fit through the house. Pt also reported talking with son who says that the steps around the back of the house have 2 rails that are close enough to reach. Reminded pt of difficulty attempting to navigate steps yesterday and how pt was ultimately unable to do it while maintaining 50% PWB precautions. Emphasized again recommendation for ramp or ambulance transport home but pt stated "let's just not talk about this anymore". Pt performed all transfers with RW and CGA/close supervision throughout session. Pt ambulated 66ft with RW and CGA to WC with decent adherence to [facility] precautions and MD arrived for morning rounds. Pt transported to/from room in [facility] dependently for time management purposes. Pt navigated 3 steps with 2 rails and min A ascending forwards and descending backwards with a step to pattern - pt unable to adhere to WB precautions and very poor carry over with all education regarding 50% PWB precautions and ultimately is going to do what she wants but would benefit from family education prior to D/C. In dayroom, pt ambulated 88ft x 1 and 58ft x 1 with RW and CGA - cues to adhere to RLE PWB status and to recognize when it's time to sit and rest based  off of fatigue and tendency to place more weight on RLE. Pt also required cues to lead with LLE and step RLE halfway to LLE to maintain precautions. Returned to room and rearranged furniture, per pt request. Pt ambulated 35ft with RW and supervision to recliner. Concluded session with pt sitting in recliner, needs within reach, and chair pad alarm on.   Therapy Documentation Precautions:  Precautions Precautions: Fall Restrictions Weight Bearing Restrictions: Yes RLE Weight Bearing: Partial weight bearing RLE Partial Weight Bearing Percentage or Pounds: 50  Therapy/Group: Individual Therapy Alfonse Alpers PT, DPT   07/22/2021, 7:28 AM

## 2021-07-22 NOTE — Progress Notes (Signed)
Physical Therapy Discharge Summary  Patient Details  Name: [name] MRN: [id number] Date of Birth: Jun 29, 1931  Patient has met 8 of 9 long term goals due to improved activity tolerance, improved balance, improved postural control, increased strength, decreased pain, ability to compensate for deficits, and improved coordination. Patient to discharge at an ambulatory level Supervision with RW. Patient's care partner is independent to provide the necessary physical assistance at discharge. Plan to discharge home with son who will provide 24/7 supervision and daughter to fly in from [place] within the next week.   Reasons goals not met: Pt did not meet bed mobility goal of Mod I as pt requires supervision due to need for cues for technique.  Recommendation:  Patient will benefit from ongoing skilled PT services in home health setting to continue to advance safe functional mobility, address ongoing impairments in transfers, generalized strengthening and endurance, adherence to 50% PWB precautions, dynamic standing balance/coordination, gait training, stair navigation, and to minimize fall risk.  Equipment: RW  Reasons for discharge: treatment goals met  Patient/family agrees with progress made and goals achieved: Yes  PT Discharge Precautions/Restrictions Precautions Precautions: Fall Restrictions Weight Bearing Restrictions: Yes RLE Weight Bearing: Partial weight bearing RLE Partial Weight Bearing Percentage or Pounds: 50% Pain Interference Pain Interference Pain Effect on Sleep: 0. Does not apply - I have not had any pain or hurting in the past 5 days Pain Interference with Therapy Activities: 0. Does not apply - I have not received rehabilitationtherapy in the past 5 days Pain Interference with Day-to-Day Activities: 1. Rarely or not at all Cognition Overall Cognitive Status: Within Functional Limits for tasks assessed Arousal/Alertness: Awake/alert Orientation Level: Oriented  X4 Memory: Impaired Awareness: Impaired Problem Solving: Impaired Safety/Judgment: Appears intact Comments: poor adherance to 50% PWB precautions and decreased understanding of how to apply PWB to functional tasks despite cues Sensation Sensation Light Touch: Appears Intact Proprioception: Appears Intact Additional Comments: pt reports RLE feels "colder" than LLE Coordination Gross Motor Movements are Fluid and Coordinated: Yes Fine Motor Movements are Fluid and Coordinated: No Coordination and Movement Description: generalized weakness/deconditioning Finger Nose Finger Test: mild tremors bilaterally Heel Shin Test: unable to perform on either LE due to weakness Motor  Motor Motor: Within Functional Limits Motor - Skilled Clinical Observations: generalized weakness/deconditioning  Mobility Bed Mobility Bed Mobility: Rolling Right;Rolling Left;Sit to Supine;Supine to Sit Rolling Right: Supervision/verbal cueing Rolling Left: Supervision/Verbal cueing Supine to Sit: Supervision/Verbal cueing Sit to Supine: Supervision/Verbal cueing Transfers Transfers: Sit to Stand;Stand to Sit;Stand Pivot Transfers Sit to Stand: Supervision/Verbal cueing Stand to Sit: Supervision/Verbal cueing Stand Pivot Transfers: Supervision/Verbal cueing Stand Pivot Transfer Details (indicate cue type and reason): cues to maintain PWB status Transfer (Assistive device): Rolling walker Locomotion  Gait Ambulation: Yes Gait Assistance: Supervision/Verbal cueing Gait Distance (Feet): 100 Feet Assistive device: Rolling walker Gait Assistance Details: Verbal cues for precautions/safety;Verbal cues for gait pattern;Visual cues for safe use of DME/AE Gait Assistance Details: verbal cues for technique to maintain PWB precautions Gait Gait: Yes Gait Pattern: Impaired Gait Pattern: Antalgic;Step-to pattern;Decreased step length - right;Decreased step length - left;Trunk flexed;Poor foot clearance - left;Poor  foot clearance - right Gait velocity: decreased Stairs / Additional Locomotion Stairs: Yes Stairs Assistance: Minimal Assistance - Patient > 75% Stair Management Technique: One rail Right Number of Stairs: 4 Height of Stairs: 6 Ramp: Supervision/Verbal cueing Wheelchair Mobility Wheelchair Mobility: Yes Wheelchair Assistance: Chartered loss adjuster: Both upper extremities Wheelchair Parts Management: Needs assistance Distance: 128f  Trunk/Postural Assessment  Cervical Assessment Cervical Assessment: Within Functional Limits Thoracic Assessment Thoracic Assessment: Exceptions to [facility] (kyphotic, forward shoulders) Lumbar Assessment Lumbar Assessment: Exceptions to [facility] (limited by pelvic pain) Postural Control Postural Control: Within Functional Limits  Balance Balance Balance Assessed: Yes Static Sitting Balance Static Sitting - Balance Support: Feet supported;Bilateral upper extremity supported Static Sitting - Level of Assistance: 7: Independent Dynamic Sitting Balance Dynamic Sitting - Balance Support: Feet supported;No upper extremity supported Dynamic Sitting - Level of Assistance: 6: Modified independent (Device/Increase time) Static Standing Balance Static Standing - Balance Support: Bilateral upper extremity supported (RW) Static Standing - Level of Assistance: 5: Stand by assistance (supervision) Dynamic Standing Balance Dynamic Standing - Balance Support: Bilateral upper extremity supported (RW) Dynamic Standing - Level of Assistance: 5: Stand by assistance (supervision) Dynamic Standing - Comments: for transfers and gait Extremity Assessment  RLE Assessment RLE Assessment: Exceptions to [facility] General Strength Comments: limited by soreness RLE Strength Right Hip Flexion: 2+/5 Right Hip ABduction: 3+/5 Right Hip ADduction: 3+/5 Right Knee Flexion: 3+/5 Right Knee Extension: 3+/5 Right Ankle Dorsiflexion: 4-/5 LLE Assessment LLE  Assessment: Exceptions to [facility] General Strength Comments: limited by soreness LLE Strength Left Hip Flexion: 4-/5 Left Hip ABduction: 3+/5 Left Hip ADduction: 3+/5 Left Knee Flexion: 3+/5 Left Knee Extension: 3+/5 Left Ankle Dorsiflexion: 4-/5 Left Ankle Plantar Flexion: 4-/5  Alfonse Alpers PT, DPT  07/22/2021, 11:58 AM

## 2021-07-22 NOTE — Progress Notes (Signed)
Patient ID: [name], female   DOB: 1932/01/06, 88 y.o.   MRN: [id number]  Team Conference Report to Patient/Family  Team Conference discussion was reviewed with the patient and caregiver, including goals, any changes in plan of care and target discharge date.  Patient and caregiver express understanding and are in agreement.  The patient has a target discharge date of 07/25/21.  Sw spoke with patient son, Shanon Brow. Provided conference updates. Family education scheduled on Friday, 1-4 PM. Pt son reports he will be assisting his mother up the stairs vs a ramp. Sw will order DME through [employer]. No additional questions or concerns.  Dyanne Iha 07/22/2021, 1:28 PM

## 2021-07-22 NOTE — Discharge Summary (Signed)
Physician Discharge Summary  Patient ID: [name] MRN: [id number] DOB/AGE: September 29, 1931 88 y.o.  Admit date: 07/17/2021 Discharge date: 07/25/2021  Discharge Diagnoses:  Principal Problem:   Pelvic fracture ([place]) DVT prophylaxis Mood stabilization PAF/SSS/PPM Hyperlipidemia GERD Hypotension CKD stage III  Discharged Condition: Stable  Significant Diagnostic Studies: DG Chest 2 View  Result Date: 07/13/2021 CLINICAL DATA:  88 year old female with right leg pain. Right hip pain. EXAM: CHEST - 2 VIEW COMPARISON:  Chest radiographs 08/05/2016 and earlier. FINDINGS: Upright AP and lateral views of the chest. Right chest dual lead cardiac pacemaker is chronic. Stable cardiac size, borderline to mildly enlarged. Other mediastinal contours are within normal limits. Visualized tracheal air column is within normal limits. Lung volumes are stable and normal. Both lungs appear clear. No pneumothorax or pleural effusion. Abdominal Calcified aortic atherosclerosis. Stable cholecystectomy clips. Osteopenia. No acute osseous abnormality identified. IMPRESSION: No acute cardiopulmonary abnormality. Aortic Atherosclerosis (ICD10-I70.0). Electronically Signed   By: Genevie Ann M.D.   On: 07/13/2021 10:55   US RENAL  Result Date: 07/13/2021 CLINICAL DATA:  An 88 year old female presents for evaluation of acute kidney injury. EXAM: RENAL / URINARY TRACT ULTRASOUND COMPLETE COMPARISON:  None FINDINGS: Right Kidney: Renal measurements: 13.8 x 5.3 x 5.3 cm = volume: 204 mL. Increased echogenicity of renal cortex. Multiple renal cysts seen in the RIGHT kidney largest measuring 5.4 x 6.2 x 5.5 cm in the upper pole, next largest measuring 3.7 x 9.1 x 3.4 cm in the interpolar RIGHT kidney. No hydronephrosis. At least 2 additional renal cysts on the RIGHT. Left Kidney: Renal measurements: 11.4 x 6.1 x 7.1 cm = volume: 257 mL. Extending inferiorly from the LEFT kidney is a 9.0 x 7.3 x 8.5 cm renal cyst. In the upper pole of  the LEFT kidney is a cyst measuring 3.7 x 3.5 x 3.1 cm. At least 3 additional small renal cysts to moderate-size renal cysts. Question mild LEFT collecting system dilation, difficult to assess due to body habitus and presence of renal cysts. Bladder: Moderate to marked distension of the urinary bladder with 569 cc bladder volume. Other: Mildly low nodular contour of the liver raising the question of liver disease. IMPRESSION: Moderate to marked distension of the urinary bladder, correlate with any signs of urinary retention. Question mild LEFT hydronephrosis, limited assessment in general due to patient body habitus. Renal cysts. Question of mildly nodular contour of the liver, correlate with any clinical or laboratory evidence of liver disease. Electronically Signed   By: Zetta Bills M.D.   On: 07/13/2021 20:29   CT Hip Right Wo Contrast  Result Date: 07/13/2021 CLINICAL DATA:  Right hip pain following a shooting pain in her back 1 week ago. Suspected right superior pubic ramus fracture on radiographs earlier today. EXAM: CT OF THE RIGHT HIP WITHOUT CONTRAST TECHNIQUE: Multidetector CT imaging of the right hip was performed according to the standard protocol. Multiplanar CT image reconstructions were also generated. RADIATION DOSE REDUCTION: This exam was performed according to the departmental dose-optimization program which includes automated exposure control, adjustment of the mA and/or kV according to patient size and/or use of iterative reconstruction technique. COMPARISON:  Right hip radiographs obtained earlier today. FINDINGS: Bones/Joint/Cartilage Mildly displaced and mildly comminuted fracture of the lateral aspect of the right superior pubic ramus. Mildly comminuted, nondisplaced fracture of the right inferior pubic ramus. The anterior and posterior columns of the acetabulum are intact as is the quadrilateral plate. No hip fracture or dislocation. Ligaments Suboptimally assessed by CT. Muscles  and  Tendons Unremarkable. Soft tissues Distended urinary bladder. IMPRESSION: Right superior and inferior pubic ramus fractures, as described above. Electronically Signed   By: Claudie Revering M.D.   On: 07/13/2021 20:00   ECHOCARDIOGRAM COMPLETE  Result Date: 07/15/2021    ECHOCARDIOGRAM REPORT   Patient Name:   [name] Date of Exam: 07/15/2021 Medical Rec #:  [id number]     Height:       59.0 in Accession #:    [id number]    Weight:       154.3 lb Date of Birth:  29-Jan-1932      BSA:          1.652 m Patient Age:    58 years      BP:           133/44 mmHg Patient Gender: F             HR:           70 bpm. Exam Location:  Inpatient Procedure: 2D Echo Indications:    CHF  History:        Patient has prior history of Echocardiogram examinations, most                 recent 10/23/2014. Arrythmias:Atrial Fibrillation; Risk                 Factors:Hypertension.  Sonographer:    Jefferey Pica Referring Phys: [place]  1. Left ventricular ejection fraction, by estimation, is 60 to 65%. The left ventricle has normal function. The left ventricle has no regional wall motion abnormalities. There is mild left ventricular hypertrophy. Left ventricular diastolic parameters are consistent with Grade I diastolic dysfunction (impaired relaxation).  2. Right ventricular systolic function is normal. The right ventricular size is normal. The estimated right ventricular systolic pressure is 85.8 mmHg.  3. Left atrial size was mildly dilated.  4. The mitral valve is normal in structure. Trivial mitral valve regurgitation. No evidence of mitral stenosis.  5. The aortic valve is tricuspid. Aortic valve regurgitation is not visualized. No aortic stenosis is present.  6. The inferior vena cava is normal in size with greater than 50% respiratory variability, suggesting right atrial pressure of 3 mmHg. FINDINGS  Left Ventricle: Left ventricular ejection fraction, by estimation, is 60 to 65%. The left ventricle has  normal function. The left ventricle has no regional wall motion abnormalities. The left ventricular internal cavity size was normal in size. There is  mild left ventricular hypertrophy. Left ventricular diastolic parameters are consistent with Grade I diastolic dysfunction (impaired relaxation). Right Ventricle: The right ventricular size is normal. No increase in right ventricular wall thickness. Right ventricular systolic function is normal. The tricuspid regurgitant velocity is 2.67 m/s, and with an assumed right atrial pressure of 3 mmHg, the estimated right ventricular systolic pressure is 85.0 mmHg. Left Atrium: Left atrial size was mildly dilated. Right Atrium: Right atrial size was normal in size. Pericardium: There is no evidence of pericardial effusion. Mitral Valve: The mitral valve is normal in structure. Trivial mitral valve regurgitation. No evidence of mitral valve stenosis. Tricuspid Valve: The tricuspid valve is normal in structure. Tricuspid valve regurgitation is trivial. Aortic Valve: The aortic valve is tricuspid. Aortic valve regurgitation is not visualized. No aortic stenosis is present. Aortic valve peak gradient measures 9.7 mmHg. Pulmonic Valve: The pulmonic valve was normal in structure. Pulmonic valve regurgitation is not visualized. Aorta: The aortic root is normal in size  and structure. Venous: The inferior vena cava is normal in size with greater than 50% respiratory variability, suggesting right atrial pressure of 3 mmHg. IAS/Shunts: No atrial level shunt detected by color flow Doppler. Additional Comments: A device lead is visualized in the right ventricle.  LEFT VENTRICLE PLAX 2D LVIDd:         4.40 cm   Diastology LVIDs:         3.00 cm   LV e' medial:    6.60 cm/s LV PW:         1.20 cm   LV E/e' medial:  9.5 LV IVS:        1.20 cm   LV e' lateral:   6.74 cm/s LVOT diam:     2.00 cm   LV E/e' lateral: 9.3 LV SV:         95 LV SV Index:   58 LVOT Area:     3.14 cm  RIGHT VENTRICLE             IVC RV Basal diam:  2.70 cm    IVC diam: 1.50 cm RV S prime:     6.94 cm/s TAPSE (M-mode): 2.6 cm LEFT ATRIUM             Index        RIGHT ATRIUM           Index LA diam:        4.00 cm 2.42 cm/m   RA Area:     15.20 cm LA Vol (A2C):   49.2 ml 29.78 ml/m  RA Volume:   42.20 ml  25.55 ml/m LA Vol (A4C):   37.0 ml 22.40 ml/m LA Biplane Vol: 44.8 ml 27.12 ml/m  AORTIC VALVE                 PULMONIC VALVE AV Area (Vmax): 2.66 cm     PV Vmax:       1.18 m/s AV Vmax:        156.00 cm/s  PV Peak grad:  5.6 mmHg AV Peak Grad:   9.7 mmHg LVOT Vmax:      132.00 cm/s LVOT Vmean:     87.000 cm/s LVOT VTI:       0.303 m  AORTA Ao Root diam: 2.80 cm Ao Asc diam:  2.80 cm MITRAL VALVE                TRICUSPID VALVE MV Area (PHT): 3.28 cm     TR Peak grad:   28.5 mmHg MV Decel Time: 231 msec     TR Vmax:        267.00 cm/s MV E velocity: 63.00 cm/s MV A velocity: 107.00 cm/s  SHUNTS MV E/A ratio:  0.59         Systemic VTI:  0.30 m                             Systemic Diam: 2.00 cm Dalton McleanMD Electronically signed by Franki Monte Signature Date/Time: 07/15/2021/5:00:33 PM    Final    DG Hip Unilat W or Wo Pelvis 2-3 Views Right  Result Date: 07/13/2021 CLINICAL DATA:  Hip pain EXAM: DG HIP (WITH OR WITHOUT PELVIS) 3V RIGHT COMPARISON:  None. FINDINGS: Stable deformity of the right superior pubic ramus. Mild degenerative changes of the bilateral hips, SI joints and pubic symphysis. Degenerative changes of the partially visualized lower lumbar spine. Soft  tissues are unremarkable. IMPRESSION: Step-off deformity of the right superior pubic ramus, concerning for fracture. Recommend further evaluation with cross-sectional imaging. Electronically Signed   By: Yetta Glassman M.D.   On: 07/13/2021 17:08   VAS [place] LOWER EXTREMITY VENOUS (DVT) (ONLY [facility] & [facility])  Result Date: 07/14/2021  Lower Venous DVT Study Patient Name:  DRAKE LANDING  Date of Exam:   07/13/2021 Medical Rec #: [id number]      Accession #:     [id number] Date of Birth: 01-31-1932       Patient Gender: F Patient Age:   80 years Exam Location:  [facility] Procedure:      VAS [place] LOWER EXTREMITY VENOUS (DVT) Referring Phys: Benjamine Mola HAMMOND --------------------------------------------------------------------------------  Indications: Edema.  Risk Factors: None identified. Limitations: Body habitus, poor ultrasound/tissue interface and patient pain tolerance. Comparison Study: No prior studies. Performing Technologist: Oliver Hum RVT  Examination Guidelines: A complete evaluation includes B-mode imaging, spectral Doppler, color Doppler, and power Doppler as needed of all accessible portions of each vessel. Bilateral testing is considered an integral part of a complete examination. Limited examinations for reoccurring indications may be performed as noted. The reflux portion of the exam is performed with the patient in reverse Trendelenburg.  +---------+---------------+---------+-----------+----------+-------------------+  RIGHT     Compressibility Phasicity Spontaneity Properties Thrombus Aging       +---------+---------------+---------+-----------+----------+-------------------+  CFV       Full            Yes       Yes                                         +---------+---------------+---------+-----------+----------+-------------------+  SFJ       Full                                                                  +---------+---------------+---------+-----------+----------+-------------------+  FV Prox   Full                                                                  +---------+---------------+---------+-----------+----------+-------------------+  FV Mid    Full                                                                  +---------+---------------+---------+-----------+----------+-------------------+  FV Distal                 Yes       Yes                                          +---------+---------------+---------+-----------+----------+-------------------+  PFV  Full                                                                  +---------+---------------+---------+-----------+----------+-------------------+  POP       Full            Yes       Yes                                         +---------+---------------+---------+-----------+----------+-------------------+  PTV       Full                                                                  +---------+---------------+---------+-----------+----------+-------------------+  PERO                                                       Not well visualized  +---------+---------------+---------+-----------+----------+-------------------+   +----+---------------+---------+-----------+----------+--------------+  LEFT Compressibility Phasicity Spontaneity Properties Thrombus Aging  +----+---------------+---------+-----------+----------+--------------+  CFV  Full            Yes       Yes                                    +----+---------------+---------+-----------+----------+--------------+    Summary: RIGHT: - There is no evidence of deep vein thrombosis in the lower extremity. However, portions of this examination were limited- see technologist comments above.  - No cystic structure found in the popliteal fossa.  LEFT: - No evidence of common femoral vein obstruction.  *See table(s) above for measurements and observations. Electronically signed by Orlie Pollen on 07/14/2021 at 6:52:13 AM.    Final     Labs:  Basic Metabolic Panel: Recent Labs  Lab 07/20/21 0635  NA 138  K 3.5  CL 101  CO2 29  GLUCOSE 99  BUN 20  CREATININE 0.92  CALCIUM 8.6*    CBC: Recent Labs  Lab 07/20/21 0635  WBC 7.8  NEUTROABS 4.8  HGB 10.5*  HCT 31.8*  MCV 94.1  PLT 225    CBG: No results for input(s): GLUCAP in the last 168 hours.  Family history.  Mother with CVA Father bladder cancer Sister with CVA.  Denies any colon cancer  esophageal cancer or rectal cancer  Brief HPI:   [name] is a 88 y.o. right-handed female with history of hypertension hyperlipidemia SSS post pacemaker generator change 2018 maintained on aspirin, CKD stage III.  Per chart review lives with her children.  Presented 07/13/2021 with bilateral lower extremity edema and hip pain.  Patient reportedly had a steroid injection to her back approximately 1 week ago with progressive worsening right lower extremity pain.  She denied any recent trauma.  X-rays right hip  showed step-off deformity of the right superior pubic ramus concerning for fracture.  CT of the right hip showed right superior and inferior pubic ramus fracture.  Orthopedic service follow-up Dr. Alvan Dame given the proximity of fractures to her acetabulum vies partial weightbearing 50% no surgical intervention.  Admission chemistries unremarkable creatinine 1.71.  Echocardiogram with ejection fraction of 60 to 65% no wall motion abnormalities grade 1 diastolic dysfunction.  In regards to patient's AKI on CKD stage III ultrasound suggested possible obstruction with large volume of urine in the bladder however right after the procedure she had a 1 L urinary output several hours later bladder ultrasound showed 470 mL and immediately afterwards she voided 500 mL.  No current concern for true obstruction and she did receive IV diuresis latest creatinine 1.30.  Lovenox for DVT prophylaxis.  Therapy evaluations completed due to patient decreased functional mobility was admitted for a comprehensive rehab program.   Hospital Course: Rilynn Habel was admitted to rehab 07/17/2021 for inpatient therapies to consist of PT, ST and OT at least three hours five days a week. Past admission physiatrist, therapy team and rehab RN have worked together to provide customized collaborative inpatient rehab.  Pertaining to patient's right superior inferior pubic ramus fracture of unclear etiology.  50% partial weightbearing  follow-up orthopedic services.  Neurovascular sensation intact.  Lovenox for DVT prophylaxis venous Doppler studies negative.  She did remain on low-dose aspirin.  History of PAF SSI pacemaker cardiogram Toprol as advised no chest pain or shortness of breath.  Pravachol for hyperlipidemia.   Blood pressures were monitored on TID basis and soft and monitored     Rehab course: During patient's stay in rehab weekly team conferences were held to monitor patient's progress, set goals and discuss barriers to discharge. At admission, patient required minimal assist 6 feet rolling walker minimal assist sit to stand  Physical exam.  Blood pressure 142/74 pulse 73 temperature 95 respirations 18 oxygen saturation is 99% room air Constitutional.  No acute distress HEENT Head.  Normocephalic and atraumatic Eyes.  Pupils round and reactive to light no discharge.nystagmus Neck.  Supple nontender no JVD without thyromegaly Cardiac regular rate and rhythm any extra sounds or murmur heard Abdomen.  Soft nontender positive bowel sounds without rebound Respiratory effort normal no respiratory distress without wheeze Skin.  Warm and dry scattered ecchymosis Neurologic.  Alert oriented x3.  Reasonable insight and awareness.  Lower extremities 4 -/5 hip flexors knee extension and 4/5 ankle dorsi plantarflexion.  He/She  has had improvement in activity tolerance, balance, postural control as well as ability to compensate for deficits. He/She has had improvement in functional use RUE/LUE  and RLE/LLE as well as improvement in awareness.  Ambulating partial weightbearing with assistive device.  Completed standing reaching forward to grab bar contact-guard.  Demonstrates appropriate weight shift.  Contact-guard for doffing close washed upper body supervision lower body contact-guard.  Full family teaching completed plan discharged home       Disposition: Discharged to home    Diet: Regular  Special  Instructions: No driving smoking or alcohol  Medications at discharge 1.  Tylenol as needed 2.  Aspirin 81 mg nightly 3.  Cardizem CD 180 mg daily 4.  Colace 100 mg p.o. twice daily 5.  Hydrocodone 1 to 2 tablets every 6 hours as needed pain 6.  Ativan 0.5 mg daily as needed 7.  Toprol-XL 12.5 mg nightly 8.  Multivitamin daily 9.  Protonix 80 mg p.o. twice daily 10.  Pravachol 40 mg p.o. nightly 11.  Melatonin 3 mg nightly 12.  Lasix 40 daily 13.  Potassium chloride 20 mEq daily   30-35 minutes were spent completing discharge summary and discharge planning  Discharge Instructions     Ambulatory referral to Physical Medicine Rehab   Complete by: As directed    Moderate complexity follow-up 1 to 2 weeks right superior inferior pubic ramus fracture        Follow-up Information     Raulkar, Clide Deutscher, MD Follow up.   Specialty: Physical Medicine and Rehabilitation Why: 4/24 please arrive at 11:20am for 11:40am follow-up, thank you! Contact information: [address] [place] [address] [phone]         Paralee Cancel, MD Follow up.   Specialty: Orthopedic Surgery Why: Call for appointment Contact information: [address] [place] [address] [phone]                 Signed: Cathlyn Parsons 07/24/2021, 5:24 AM

## 2021-07-23 MED ORDER — MELATONIN 3 MG PO TABS
3.0000 mg | ORAL_TABLET | Freq: Every day | ORAL | Status: DC
  Administered 2021-07-23 – 2021-07-24 (×2): 3 mg via ORAL
  Filled 2021-07-23 (×2): qty 1

## 2021-07-23 NOTE — Progress Notes (Signed)
Occupational Therapy Session Note  Patient Details  Name: [name] MRN: [id number] Date of Birth: 1932-03-08  Today's Date: 07/23/2021 OT Individual Time: 2694-8546 OT Individual Time Calculation (min): 56 min    Short Term Goals: Week 1:  OT Short Term Goal 1 (Week 1): STG = LTG due to ELOS  Skilled Therapeutic Interventions/Progress Updates:  Skilled OT intervention completed with focus on AE education, functional ambulation and bed mobility, activity tolerance. Pt received seated in recliner, agreeable to session. Therapist provided education on AE including long handled shoe horn and sock aid, to promote pt's independence with LB clothing management. With demonstration provided, verbal cues for technique throughout and mass practice, pt able to both socks and shoes with fading assist to mod I level. Education provided to pt on where items can be purchased for home use. Pt c/o difficulty with bed mobility due to weakness of BLEs and requested that therapy consist of practice with this. Therapist provided education on several techniques to ease pt's ability to get into bed (per pt's biggest complaint), with pt eventually preferring to do her own way via sitting upright and lifting one leg at a time, despite therapist recommendations. Pt completed sit > stand and ambulatory transfers using RW with supervision throughout entire session. Pt initially requiring min A for BLE management onto the bed, however fading assist to supervision/cues for bed mobility with HOB flat and bed rails down to simulate pt's bed at home. Pt able to use bridge technique for bed positioning. Pt practiced entering the bed x2 on each side to determine efficiency, with pt with increased ease when getting in on the R side of bed, leading with the LLE-stronger leg first. Pt left seated in recliner, with BLEs elevated, chair alarm on and all needs in reach at end of session.   Therapy Documentation Precautions:   Precautions Precautions: Fall Restrictions Weight Bearing Restrictions: Yes RLE Weight Bearing: Partial weight bearing RLE Partial Weight Bearing Percentage or Pounds: 50%  Pain: Unrated pain in R hip, premedicated, improved with rest breaks  Therapy/Group: Individual Therapy  Lexus Shampine E Dionna Wiedemann 07/23/2021, 8:00 AM

## 2021-07-23 NOTE — Progress Notes (Signed)
Patient ID: [name], female   DOB: 08-Jul-1931, 88 y.o.   MRN: [id number]  Patient approved by [facility].

## 2021-07-23 NOTE — Progress Notes (Signed)
Physical Therapy Session Note ° °Patient Details  °Name: [name] °MRN: [id number] °Date of Birth: 12/31/1931 ° °Today's Date: 07/23/2021 °PT Individual Time: 1007-1035 °PT Individual Time Calculation (min): 28 min  ° °Short Term Goals: °Week 1:  PT Short Term Goal 1 (Week 1): = LTGs due to ELOS °\ °Skilled Therapeutic Interventions/Progress Updates:  ° ° °Pt received sitting in  recliner and agreeable to PT. Reports need for urination. Ambulatory transfer to BSC over toilet with RW and supervision assist. Min cues for AD management over threshold into bathroom. Pt able to perform clothing management and pericare with distant supervision assist. Hand hygiene at sink with supervision assist from PT. Pt then performed seated therex. Leve 3 tband LAQ, HS curl, hip abduction, ankle DF/PF. AROM hip flexion. Each performed x 10 with cues for full ROM in pain free range.  Pt left sitting in recliner with call bell in reach and all needs met.   ° ° °  ° °   ° °Therapy Documentation °Precautions:  °Precautions °Precautions: Fall °Restrictions °Weight Bearing Restrictions: Yes °RLE Weight Bearing: Partial weight bearing °RLE Partial Weight Bearing Percentage or Pounds: 50% °Pain: denies °  °Therapy/Group: Individual Therapy ° °Austin E Tucker °07/23/2021, 10:38 AM  °

## 2021-07-23 NOTE — Progress Notes (Signed)
Occupational Therapy Discharge Summary  Patient Details  Name: [name] MRN: [id number] Date of Birth: 1932/04/06   Patient has met 10 of 10 long term goals due to improved activity tolerance, improved balance, postural control, ability to compensate for deficits, improved attention, improved awareness, and improved coordination.  Patient to discharge at overall Supervision level.  Patient's son and daughter are independent to provide supervision assist to occasional min A for higher level functional tasks at discharge.    Reasons goals not met: n/a  Recommendation:  Patient will benefit from ongoing skilled OT services in home health setting to continue to advance functional skills in the area of BADL, iADL, and Reduce care partner burden.  Equipment: Shower chair with back/arms  Reasons for discharge: treatment goals met  Patient/family agrees with progress made and goals achieved: Yes  OT Discharge Precautions/Restrictions  Precautions Precautions: Fall Restrictions Weight Bearing Restrictions: Yes RLE Weight Bearing: Partial weight bearing RLE Partial Weight Bearing Percentage or Pounds: 50% ADL ADL Eating: Modified independent Where Assessed-Eating: Chair Grooming: Modified independent Where Assessed-Grooming: Standing at sink Upper Body Bathing: Modified independent Where Assessed-Upper Body Bathing: Shower Lower Body Bathing: Supervision/safety Where Assessed-Lower Body Bathing: Shower Upper Body Dressing: Modified independent (Device) Where Assessed-Upper Body Dressing: Edge of bed Lower Body Dressing: Supervision/safety Where Assessed-Lower Body Dressing: Edge of bed Toileting: Supervision/safety Where Assessed-Toileting: Glass blower/designer: Close supervision Toilet Transfer Method: Ambulating (RW) Science writer: Energy manager: Not assessed Social research officer, government: Close supervision Social research officer, government Method:  Heritage manager: Radio broadcast assistant, Grab bars Vision Baseline Vision/History: 1 Wears glasses (just for reading) Patient Visual Report: No change from baseline Vision Assessment?: No apparent visual deficits Perception  Perception: Within Functional Limits Praxis Praxis: Intact Cognition Overall Cognitive Status: Within Functional Limits for tasks assessed Arousal/Alertness: Awake/alert Orientation Level: Oriented X4 Year: 2023 Month: February Day of Week: Correct Memory: Impaired Immediate Memory Recall: Blue;Bed Memory Recall Sock: Not able to recall Memory Recall Blue: Without Cue Memory Recall Bed: Without Cue Awareness: Impaired Problem Solving: Impaired Safety/Judgment: Appears intact Comments: poor adherance to 50% PWB precautions and decreased understanding of how to apply PWB to functional tasks despite cues Sensation Sensation Light Touch: Appears Intact Hot/Cold: Not tested Proprioception: Appears Intact Stereognosis: Not tested Coordination Gross Motor Movements are Fluid and Coordinated: Yes Fine Motor Movements are Fluid and Coordinated: No Coordination and Movement Description: generalized weakness/deconditioning Finger Nose Finger Test: mild tremors bilaterally Motor  Motor Motor: Within Functional Limits Motor - Skilled Clinical Observations: generalized weakness/deconditioning Mobility  Bed Mobility Bed Mobility: Rolling Right;Rolling Left;Left Sidelying to Sit Rolling Right: Independent with assistive device Rolling Left: Independent with assistive device Left Sidelying to Sit: Independent with assistive device Transfers Sit to Stand: Supervision/Verbal cueing (RW) Stand to Sit: Supervision/Verbal cueing  Trunk/Postural Assessment  Cervical Assessment Cervical Assessment: Within Functional Limits Thoracic Assessment Thoracic Assessment: Exceptions to [facility] (kyphosis, forward shoulders) Lumbar Assessment Lumbar Assessment:  Exceptions to [facility] (limited by pelvic pain) Postural Control Postural Control: Within Functional Limits  Balance Balance Balance Assessed: Yes Static Sitting Balance Static Sitting - Balance Support: Feet supported;Bilateral upper extremity supported Static Sitting - Level of Assistance: 7: Independent Dynamic Sitting Balance Dynamic Sitting - Balance Support: Feet supported;No upper extremity supported Dynamic Sitting - Level of Assistance: 6: Modified independent (Device/Increase time) Static Standing Balance Static Standing - Balance Support: Bilateral upper extremity supported (RW) Static Standing - Level of Assistance: 5: Stand by assistance (supervision) Dynamic Standing Balance Dynamic Standing -  Balance Support: Bilateral upper extremity supported (RW) Dynamic Standing - Level of Assistance: 5: Stand by assistance (supervision) Dynamic Standing - Comments: for transfers and gait Extremity/Trunk Assessment RUE Assessment RUE Assessment: Within Functional Limits Active Range of Motion (AROM) Comments: >150 General Strength Comments: Grossly 4/5 LUE Assessment LUE Assessment: Within Functional Limits Active Range of Motion (AROM) Comments: >150 General Strength Comments: Grossly 4/5   Kelen Laura E Flint Hakeem 07/23/2021, 2:07 PM

## 2021-07-23 NOTE — Progress Notes (Signed)
Physical Therapy Session Note  Patient Details  Name: [name] MRN: [id number] Date of Birth: 1931/08/11  Today's Date: 07/23/2021 PT Individual Time: [phone] and 1330-1425 PT Individual Time Calculation (min): 57 min and 55 min  Short Term Goals: Week 1:  PT Short Term Goal 1 (Week 1): = LTGs due to ELOS  Skilled Therapeutic Interventions/Progress Updates:   Treatment Session 1 Received pt sitting in recliner, pt agreeable to PT treatment, and reported pain in R hip 5/10. RN notified and present to administer pain medication and provided rest breaks and repositioning for pain relief. Session with emphasis on discharge planning, dressing, functional mobility/transfers, gait training, and improved activity tolerance. Doffed nightgown and donned clean pull over shirt with set up assist. Pt stood to remove dirty underwear with close supervision and donned clean pair seated with min A to thread RLE through - cues to put RLE in first in the future. Donned pants in same manner and stood with supervision to pull pants over hips. Pt brushed hair mod I and required max A to don socks and shoes. Sit<>stand with RW and CGA/close supervision and pt ambulated 43ft with RW and close supervision prior to stopping due to increased R hip pain - cues to lead with LLE and step halfway through with RLE to maintain precautions. MD briefly arrived for morning rounds. Worked on bed mobility from flat bed and pt transferred sit<>supine with supervision using gait belt as leg lifter due to difficulty lifting RLE and supine<>sitting EOB with supervision. Stand<>pivot bed<>recliner with RW and supervision. Of note, pt required increased time to take medications and to get dressed this morning due to increased soreness in R hip. Concluded session with pt sitting in recliner, needs within reach, and chair pad alarm on.   Treatment Session 2 Received pt sitting in recliner, pt agreeable to PT treatment, and denied any pain  during session just "soreness" in R hip. Session with emphasis on functional mobility/transfers, generalized strengthening and ROM, dynamic standing balance/coordination, gait training, simulated car transfers, and improved activity tolerance. Pt's RW arrived and therapist adjusted height. Stand<>pivot recliner<>WC with RW and supervision and pt performed WC mobility 123ft using BUE and supervision with increased time and cues for propulsion technique. Pt transported to ortho gym in [facility] dependently and performed ambulatory simulated car transfer with RW and supervision using gait belt as leg lifter for RLE and cues for safe entry. Pt then ambulated 22ft on uneven surfaces (ramp) with RW and supervision - limited by increased soreness in R hip. Pt performed BUE strengthening on UBE at level 1 for 3 minutes forward and 3 minutes backwards with emphasis on UE strength/endurance. Returned to room and ambulated 68ft with RW and supervision to recliner. Concluded session with pt sitting in recliner, needs within reach, and chair pad alarm on. Provided pt with blanket, ice pack for R hip, ensure, and diet cola per pt request.   Therapy Documentation Precautions:  Precautions Precautions: Fall Restrictions Weight Bearing Restrictions: Yes RLE Weight Bearing: Partial weight bearing RLE Partial Weight Bearing Percentage or Pounds: 50%  Therapy/Group: Individual Therapy Alfonse Alpers PT, DPT   07/23/2021, 7:18 AM

## 2021-07-23 NOTE — Progress Notes (Signed)
Patient ID: [name], female   DOB: 1932-06-07, 88 y.o.   MRN: [id number]  DME ordered through [employer]:  Youth RW and shower chair

## 2021-07-23 NOTE — Progress Notes (Signed)
Patient ID: [name], female   DOB: 29-Oct-1931, 88 y.o.   MRN: [id number]  [facility] referral sent to [facility] for review.

## 2021-07-23 NOTE — Progress Notes (Signed)
PROGRESS NOTE   Subjective/Complaints: No new complaints this morning Still with pelvic pain, discussed that tylenol is now scheduled. Discussed with nursing icing 15 minutes three times per day  ROS: Patient denies fever, rash, sore throat, blurred vision, dizziness, nausea, vomiting, diarrhea, cough, shortness of breath or chest pain, +difficulty urinating, +pelvic pain, +insomnia   Objective:   No results found. No results for input(s): WBC, HGB, HCT, PLT in the last 72 hours.  No results for input(s): NA, K, CL, CO2, GLUCOSE, BUN, CREATININE, CALCIUM in the last 72 hours.   Intake/Output Summary (Last 24 hours) at 07/23/2021 1114 Last data filed at 07/23/2021 0711 Gross per 24 hour  Intake 460 ml  Output --  Net 460 ml        Physical Exam: Vital Signs Blood pressure (!) 141/51, pulse 70, temperature 98.4 F (36.9 C), temperature source Oral, resp. rate 18, height 4\' 11"  (1.499 m), weight 72.5 kg, SpO2 98 %. Gen: no distress, normal appearing, BMI 32.28 HEENT: oral mucosa pink and moist, NCAT Cardio: Reg rate Chest: normal effort, normal rate of breathing Abd: soft, non-distended  Ext: no clubbing, cyanosis, 1+ LE edema Psych: pleasant and cooperative, anxious  Skin: Clean and intact without signs of breakdown, scattered ecchymoses and old blisters on legs.  Neuro:  Alert and oriented x 3. Normal insight and awareness. Intact Memory. Normal language and speech. Cranial nerve exam unremarkable. UE 5/5. LE limited proximally by pain, 2-3/5 to 4/5 distally Musculoskeletal: R>L pelvic pain with bed mobility. S for assist to bathroom.     Assessment/Plan: 1. Functional deficits which require 3+ hours per day of interdisciplinary therapy in a comprehensive inpatient rehab setting. Physiatrist is providing close team supervision and 24 hour management of active medical problems listed below. Physiatrist and rehab team  continue to assess barriers to discharge/monitor patient progress toward functional and medical goals  Care Tool:  Bathing    Body parts bathed by patient: Right arm, Left arm, Chest, Abdomen, Right upper leg, Left upper leg, Right lower leg, Left lower leg, Face, Front perineal area, Buttocks   Body parts bathed by helper: Front perineal area, Buttocks     Bathing assist Assist Level: Contact Guard/Touching assist     Upper Body Dressing/Undressing Upper body dressing   What is the patient wearing?: Pull over shirt    Upper body assist Assist Level: Set up assist    Lower Body Dressing/Undressing Lower body dressing      What is the patient wearing?: Underwear/pull up, Pants     Lower body assist Assist for lower body dressing: Supervision/Verbal cueing     Toileting Toileting    Toileting assist Assist for toileting: Contact Guard/Touching assist     Transfers Chair/bed transfer  Transfers assist     Chair/bed transfer assist level: Supervision/Verbal cueing     Locomotion Ambulation   Ambulation assist      Assist level: Supervision/Verbal cueing Assistive device: Walker-rolling Max distance: 22ft   Walk 10 feet activity   Assist     Assist level: Supervision/Verbal cueing Assistive device: Walker-rolling   Walk 50 feet activity   Assist Walk 50 feet with 2 turns  activity did not occur: Safety/medical concerns (pain)         Walk 150 feet activity   Assist Walk 150 feet activity did not occur: Safety/medical concerns (pain)         Walk 10 feet on uneven surface  activity   Assist Walk 10 feet on uneven surfaces activity did not occur: Safety/medical concerns (balance deficits)         Wheelchair     Assist Is the patient using a wheelchair?: Yes Type of Wheelchair: Manual    Wheelchair assist level: Minimal Assistance - Patient > 75% Max wheelchair distance: 150    Wheelchair 50 feet with 2 turns  activity    Assist        Assist Level: Minimal Assistance - Patient > 75%   Wheelchair 150 feet activity     Assist      Assist Level: Minimal Assistance - Patient > 75%   Blood pressure (!) 141/51, pulse 70, temperature 98.4 F (36.9 C), temperature source Oral, resp. rate 18, height 4\' 11"  (1.499 m), weight 72.5 kg, SpO2 98 %. Medical Problem List and Plan: 1. Functional deficits secondary to right superior and inferior pubic ramus fractures of unclear etiology. Pt denies recent fall.   -Partial weightbearing RLE due to close proximity of SPR fx to acetabulum             -patient may shower             -ELOS/Goals: 10-14 days, goals supervision with PT, sup/min OT  Continue CIR therapies including PT, OT   -told pt she was free to shift/move in bed as she tolerates  HFU scheduled 2.  Antithrombotics: -DVT/anticoagulation:  Pharmaceutical: Lovenox.Vascular study negative             -antiplatelet therapy: Aspirin 81 mg daily 3. Pelvic fracture pain: Hydrocodone as needed. Scheduled tylenol.  4. Mood/anxiety disorder: Ativan 0.5 mg daily as needed, pt used this at home             -low dose trazodone if needed for sleep as well  -needs regular positive reinforcement             -antipsychotic agents: N/A 5. Neuropsych: This patient is capable of making decisions on her own behalf. 6. Skin/Wound Care: Routine skin checks 7. Fluids/Electrolytes/Nutrition: Routine in and outs with follow-up chemistries 8.  AKI: Cr reviewed 1/30: normalized.  9.  PAF/SSS/PPM.  Cardizem 180 mg daily, Toprol 25 mg nightly.  Follow-up per cardiology services. Reviewed current medications and compared to home medications- discussed with patient that she is on her home meds.              -monitor activity tolerance with therapies 10.  Hyperlipidemia.  Pravachol 11.  GERD.  continue Protonix 12. Suboptimal potassium: supplemented, monitor as needed.  13. Bilateral lower extremity edema:  elevate legs, apply compression garments and ice 14. Hypotension to diastolic 41: decrease lopressor to 12.5mg  HS, continue      LOS: 6 days A FACE TO FACE EVALUATION WAS PERFORMED  [name] P [name] 07/23/2021, 11:14 AM

## 2021-07-24 MED ORDER — DILTIAZEM HCL ER COATED BEADS 180 MG PO CP24: 180.0000 mg | ORAL_CAPSULE | Freq: Every day | ORAL | 3 refills | Status: DC

## 2021-07-24 MED ORDER — MELATONIN 3 MG PO TABS: 3.0000 mg | ORAL_TABLET | Freq: Every day | ORAL | 0 refills | Status: DC

## 2021-07-24 MED ORDER — PANTOPRAZOLE SODIUM 40 MG PO TBEC: 80.0000 mg | DELAYED_RELEASE_TABLET | Freq: Every day | ORAL | 0 refills | Status: DC

## 2021-07-24 MED ORDER — POTASSIUM CHLORIDE CRYS ER 20 MEQ PO TBCR: 20.0000 meq | EXTENDED_RELEASE_TABLET | Freq: Every day | ORAL | 2 refills | Status: DC

## 2021-07-24 MED ORDER — ACETAMINOPHEN 325 MG PO TABS: 650.0000 mg | ORAL_TABLET | Freq: Three times a day (TID) | ORAL | Status: DC

## 2021-07-24 MED ORDER — DOCUSATE SODIUM 100 MG PO CAPS: 100.0000 mg | ORAL_CAPSULE | Freq: Two times a day (BID) | ORAL | 0 refills | Status: DC

## 2021-07-24 MED ORDER — PRAVASTATIN SODIUM 40 MG PO TABS: 40.0000 mg | ORAL_TABLET | Freq: Every day | ORAL | 0 refills | Status: DC

## 2021-07-24 MED ORDER — HYDROCODONE-ACETAMINOPHEN 5-325 MG PO TABS: 1.0000 | ORAL_TABLET | Freq: Four times a day (QID) | ORAL | 0 refills | Status: DC | PRN

## 2021-07-24 MED ORDER — FUROSEMIDE 40 MG PO TABS: 40.0000 mg | ORAL_TABLET | Freq: Every day | ORAL | 2 refills | Status: DC

## 2021-07-24 MED ORDER — LORAZEPAM 0.5 MG PO TABS: 0.5000 mg | ORAL_TABLET | Freq: Every day | ORAL | 0 refills | Status: DC | PRN

## 2021-07-24 MED ORDER — METOPROLOL SUCCINATE ER 25 MG PO TB24: 12.5000 mg | ORAL_TABLET | Freq: Every day | ORAL | 0 refills | Status: DC

## 2021-07-24 MED ORDER — VITAMIN D 25 MCG (1000 UNIT) PO TABS
1000.0000 [IU] | ORAL_TABLET | Freq: Every day | ORAL | 0 refills | Status: AC
Start: 2021-07-24 — End: ?

## 2021-07-24 NOTE — Progress Notes (Signed)
Physical Therapy Session Note  Patient Details  Name: [name] MRN: [id number] Date of Birth: 04-30-1932  Today's Date: 07/24/2021 PT Individual Time: 1400-1500 PT Individual Time Calculation (min): 60 min   Short Term Goals: Week 1:  PT Short Term Goal 1 (Week 1): = LTGs due to ELOS    Skilled Therapeutic Interventions/Progress Updates:   Pt received sitting in WC and agreeable to PT  Family education with son. Gait ttraining with RW x 134ft with supervision assist for safety. Cues for safe guarding technique and need for instruction with floor transition for safety. Stair management performed with BUE on 1 rail R to attempt side stepping Ascent. Min assist from PT but ppor adherence to [facility]. Pt performed ascent with shower chair, sit>pivot>stand with CGA for safety and cues for proper technique to return to standing with descent while maintaining PWB. Son reports preference for use of shower chair for safety. Car transfer with RW and supervision assist onto car seat and min assist for management of the RLE. Bed mobility performed with CGA for management of the RLE due to increasing pain to 5/10 at end of session decreased once sitting in recliner. . Cues for use of R side of bed for all bed mobility for pain management and safety.    PT instructed pt in Grad day assessment to measure progress toward goals. See below for details.      Therapy Documentation Precautions:  Precautions Precautions: Fall Restrictions Weight Bearing Restrictions: Yes RLE Weight Bearing: Partial weight bearing RLE Partial Weight Bearing Percentage or Pounds: 50%  Vital Signs: Therapy Vitals Temp: 98.8 F (37.1 C) Pulse Rate: 71 Resp: 16 BP: (!) 155/55 Patient Position (if appropriate): Sitting Oxygen Therapy SpO2: 98 % O2 Device: Room Air Pain:   See above     Therapy/Group: Individual Therapy  Lorie Phenix 07/24/2021, 3:28 PM

## 2021-07-24 NOTE — Progress Notes (Signed)
Physical Therapy Session Note  Patient Details  Name: [name] MRN: [id number] Date of Birth: 04/16/1932  Today's Date: 07/24/2021 PT Individual Time: 0915-0955 PT Individual Time Calculation (min): 40 min   Short Term Goals: Week 1:  PT Short Term Goal 1 (Week 1): = LTGs due to ELOS  Skilled Therapeutic Interventions/Progress Updates:      Pt sitting in recliner to start session. Agreeable to PT tx. Reports 8/10 R hip pain which she was provided pain medication from RN prior to PT arrival. Discussed some general DC planning, home safety, and fall prevention strategies at home. Pt able to recall 50% WB restriction on RLE without cues.   Sit<>Stand to RW with supervision from recliner height. Ambulated ~36ft with supervision and RW within hallways - gait speed decreased for age with min cues for WB compliance on R side. Transported remaining distance to main rehab gym time and energy conservation.   Focused remainder of session on stair training to prepare for family ed later in the afternoon and for transitioning home tomorrow. Pt unable to recall sequencing for stair training (ascending with L, descending with R). She was also unable to recall even after immediate ed or while completing the stairs.   She reports 1 hand rail available at home but she was unable to complete with 1 rail due to fear of falling and inability to adhere to WB restrictions. Resorted to 2 rails where she required minA for ascending x4, 6inch steps. She was unable to descend 6inch steps due to fear of falling and R hip and L knee pain. She was able to descend x8, 3inch steps using 2 handrails with minA.   Transported in w/c back to her room for time and energy conservation. Completed ambulatory transfer with supervision and RW to recliner. Concluded session seated in recliner with chair alarm on, all needs within reach.   Therapy Documentation Precautions:  Precautions Precautions: Fall Restrictions Weight  Bearing Restrictions: Yes RLE Weight Bearing: Partial weight bearing RLE Partial Weight Bearing Percentage or Pounds: 50% General:     Therapy/Group: Individual Therapy  Alger Simons 07/24/2021, 7:43 AM

## 2021-07-24 NOTE — Progress Notes (Signed)
PT started melatonin at bedtime for rest and was effective. PT slept throughout the night with the exception of getting up to ambulate the toilet twice with 1 person assistance and RW. PT reports sleeping well.   0400: Pt requested to go the bathroom and wanted to stay up in the chair. Pt reports after sleeping in the bed all night the chair relieves the soreness from her pelvic fracture. Chair alarm activated and safety belt is attached to PT.   Pt is a/o times 4. Her resp are even and unlabored. Pt denies any acute distress at this time.

## 2021-07-24 NOTE — Progress Notes (Signed)
Inpatient Rehabilitation Discharge Medication Review by a Pharmacist  A complete drug regimen review was completed for this patient to identify any potential clinically significant medication issues.  High Risk Drug Classes Is patient taking? Indication by Medication  Antipsychotic No   Anticoagulant No   Antibiotic No   Opioid Yes Norco prn for moderate pain  Antiplatelet Yes Aspirin for CVA ppx  Hypoglycemics/insulin No   Vasoactive Medication Yes Metoprolol, Diltiazem for PAF. Lasix- blood pressure  Chemotherapy No   Other Yes Melatonin- sleep Protonix- GERD Pravachol- HLD     Type of Medication Issue Identified Description of Issue Recommendation(s)  Drug Interaction(s) (clinically significant)     Duplicate Therapy     Allergy     No Medication Administration End Date     Incorrect Dose     Additional Drug Therapy Needed     Significant med changes from prior encounter (inform family/care partners about these prior to discharge).    Other       Clinically significant medication issues were identified that warrant physician communication and completion of prescribed/recommended actions by midnight of the next day:  No   Time spent performing this drug regimen review (minutes):  30   Devon Pretty BS, PharmD, BCPS Clinical Pharmacist 07/24/2021 10:09 AM

## 2021-07-24 NOTE — Progress Notes (Signed)
PROGRESS NOTE   Subjective/Complaints: No new complaints this morning On the commode Feeling fair Has caregiver training today  ROS: Patient denies fever, rash, sore throat, blurred vision, dizziness, nausea, vomiting, diarrhea, cough, shortness of breath or chest pain, +difficulty urinating, +pelvic pain, +insomnia- improved   Objective:   No results found. No results for input(s): WBC, HGB, HCT, PLT in the last 72 hours.  No results for input(s): NA, K, CL, CO2, GLUCOSE, BUN, CREATININE, CALCIUM in the last 72 hours.   Intake/Output Summary (Last 24 hours) at 07/24/2021 1037 Last data filed at 07/24/2021 0739 Gross per 24 hour  Intake 920 ml  Output --  Net 920 ml        Physical Exam: Vital Signs Blood pressure (!) 153/53, pulse 73, temperature 98.2 F (36.8 C), resp. rate 18, height 4\' 11"  (1.499 m), weight 72.5 kg, SpO2 98 %. Gen: no distress, normal appearing, BMI 32.28 HEENT: oral mucosa pink and moist, NCAT Cardio: Reg rate Chest: normal effort, normal rate of breathing Abd: soft, non-distended  Ext: no clubbing, cyanosis, 1+ LE edema Psych: pleasant and cooperative, anxious  Skin: Clean and intact without signs of breakdown, scattered ecchymoses and old blisters on legs.  Neuro:  Alert and oriented x 3. Normal insight and awareness. Intact Memory. Normal language and speech. Cranial nerve exam unremarkable. UE 5/5. LE limited proximally by pain, 2-3/5 to 4/5 distally Musculoskeletal: R>L pelvic pain with bed mobility. S for assist to bathroom.  GU: on commode    Assessment/Plan: 1. Functional deficits which require 3+ hours per day of interdisciplinary therapy in a comprehensive inpatient rehab setting. Physiatrist is providing close team supervision and 24 hour management of active medical problems listed below. Physiatrist and rehab team continue to assess barriers to discharge/monitor patient progress  toward functional and medical goals  Care Tool:  Bathing    Body parts bathed by patient: Right arm, Left arm, Chest, Abdomen, Right upper leg, Left upper leg, Right lower leg, Left lower leg, Face, Front perineal area, Buttocks   Body parts bathed by helper: Front perineal area, Buttocks     Bathing assist Assist Level: Contact Guard/Touching assist     Upper Body Dressing/Undressing Upper body dressing   What is the patient wearing?: Pull over shirt    Upper body assist Assist Level: Set up assist    Lower Body Dressing/Undressing Lower body dressing      What is the patient wearing?: Underwear/pull up, Pants     Lower body assist Assist for lower body dressing: Supervision/Verbal cueing     Toileting Toileting    Toileting assist Assist for toileting: Contact Guard/Touching assist     Transfers Chair/bed transfer  Transfers assist     Chair/bed transfer assist level: Supervision/Verbal cueing     Locomotion Ambulation   Ambulation assist      Assist level: Supervision/Verbal cueing Assistive device: Walker-rolling Max distance: 71ft   Walk 10 feet activity   Assist     Assist level: Supervision/Verbal cueing Assistive device: Walker-rolling   Walk 50 feet activity   Assist Walk 50 feet with 2 turns activity did not occur: Safety/medical concerns (pain)  Walk 150 feet activity   Assist Walk 150 feet activity did not occur: Safety/medical concerns (pain)         Walk 10 feet on uneven surface  activity   Assist Walk 10 feet on uneven surfaces activity did not occur: Safety/medical concerns (balance deficits)   Assist level: Supervision/Verbal cueing     Wheelchair     Assist Is the patient using a wheelchair?: Yes Type of Wheelchair: Manual    Wheelchair assist level: Supervision/Verbal cueing Max wheelchair distance: 178ft    Wheelchair 50 feet with 2 turns activity    Assist        Assist  Level: Supervision/Verbal cueing   Wheelchair 150 feet activity     Assist      Assist Level: Supervision/Verbal cueing   Blood pressure (!) 153/53, pulse 73, temperature 98.2 F (36.8 C), resp. rate 18, height 4\' 11"  (1.499 m), weight 72.5 kg, SpO2 98 %. Medical Problem List and Plan: 1. Functional deficits secondary to right superior and inferior pubic ramus fractures of unclear etiology. Pt denies recent fall.   -Partial weightbearing RLE due to close proximity of SPR fx to acetabulum             -patient may shower             -ELOS/Goals: 10-14 days, goals supervision with PT, sup/min OT  Continue CIR therapies including PT, OT   -told pt she was free to shift/move in bed as she tolerates  HFU scheduled 2.  Antithrombotics: -DVT/anticoagulation:  Pharmaceutical: Lovenox.Vascular study negative             -antiplatelet therapy: Aspirin 81 mg daily 3. Pelvic fracture pain: Hydrocodone as needed. Scheduled tylenol.  4. Mood/anxiety disorder: Ativan 0.5 mg daily as needed, pt used this at home             -low dose trazodone if needed for sleep as well  -needs regular positive reinforcement             -antipsychotic agents: N/A 5. Neuropsych: This patient is capable of making decisions on her own behalf. 6. Skin/Wound Care: Routine skin checks 7. Fluids/Electrolytes/Nutrition: Routine in and outs with follow-up chemistries 8.  AKI: Cr reviewed 1/30: normalized.  9.  PAF/SSS/PPM.  Cardizem 180 mg daily, Toprol 25 mg nightly.  Follow-up per cardiology services. Reviewed current medications and compared to home medications- discussed with patient that she is on her home meds.              -monitor activity tolerance with therapies 10.  Hyperlipidemia.  Pravachol 11.  GERD.  continue Protonix 12. Suboptimal potassium: supplemented, monitor as needed. Continue to monitor outpatient.  13. Bilateral lower extremity edema: elevate legs, apply compression garments and ice 14.  Hypotension to diastolic 41: decrease lopressor to 12.5mg  HS, improved, continue 15. Obesity: BMI 32.28: provide dietary education.       LOS: 7 days A FACE TO FACE EVALUATION WAS PERFORMED  [name] 07/24/2021, 10:37 AM

## 2021-07-24 NOTE — Progress Notes (Signed)
Occupational Therapy Session Note  Patient Details  Name: [name] MRN: [id number] Date of Birth: 07-10-1931  Today's Date: 07/24/2021 OT Individual Time: 6754-4920 & 1007-1219 OT Individual Time Calculation (min): 24 min & 53 min   Short Term Goals: Week 1:  OT Short Term Goal 1 (Week 1): STG = LTG due to ELOS  Skilled Therapeutic Interventions/Progress Updates:  Session 1 Skilled OT intervention completed with focus on self-care for functional endurance as well as functional ambulation. Pt received seated in recliner, agreeable to session. Pt with no c/o pain during or through session. Pt requesting to brush teeth, with pt completing sit > stand and ambulatory transfer with RW to sink with supervision. To promote endurance, pt completed oral care in standing with supervision with intermittent UB support on sink vs RW. Pt able to maintain standing for >5 mins. Pt ambulated in hallway with supervision using RW about 50 ft, with w/c follow for seated rest break. Pt with mod adherence to [facility] precautions with min verbal cues needed. Education provided to pt about family education session with OT in the PM session, and advised therapist go over AE education for family to purchase items to increase pt's independence with self-care once home as well as discussing strategies for safety at home. Pt left seated in recliner, with chair alarm on, BLEs elevated, and all needs in reach at end of session.  Session 2 Skilled OT intervention completed with focus on family education, with pt's son present, as well as kitchen management education and dynamic standing balance. Pt received seated in recliner, agreeable to session. Therapist educated pt's son of pt's CLOF at the supervision level for ambulation with RW and self-care, recommendations for DME and AE to increase pt's independence, home management safety tips and pt's PWB precautions that should be enforced once home with supervision/cues to remember. Pt  completed sit > stand and ambulatory transfer with RW and supervision throughout whole session. Pt was able to complete all pericare on [facility] over toilet with supervision, with pt with continent void episode only. Transported pt <> ADL kitchen in w/c with total A for energy conservation. Educated pt/pt's son on how to maximize safety in the kitchen by setting up the environment with placing items in reachable levels and placing chairs around for energy conservation. Pt able to demonstrate a supervision ambulatory transfer using RW throughout the kitchen while gathering items and transferring items to/from different counter spaces. Advised that pt get a RW bag for increased safety to place items in. Pt and family with all questions addressed. Pt was left seated in recliner, with chair alarm on, and all needs in reach at end of session.    Therapy Documentation Precautions:  Precautions Precautions: Fall Restrictions Weight Bearing Restrictions: Yes RLE Weight Bearing: Partial weight bearing RLE Partial Weight Bearing Percentage or Pounds: 50%    Therapy/Group: Individual Therapy  Marga Gramajo E Ebone Alcivar 07/24/2021, 7:51 AM

## 2021-07-24 NOTE — Progress Notes (Signed)
Inpatient Rehabilitation Care Coordinator Discharge Note   Patient Details  Name: [name] MRN: [id number] Date of Birth: [birth year]   Discharge location: Home  Length of Stay: 7 days  Discharge activity level: supervision  Home/community participation: pt son  Patient response BR:AXENMM Literacy - How often do you need to have someone help you when you read instructions, pamphlets, or other written material from your doctor or pharmacy?: Often  Patient response HW:KGSUPJ Isolation - How often do you feel lonely or isolated from those around you?: Never  Services provided included: SW, Pharmacy, TR, CM, RN, SLP, OT, PT, RD, MD  Financial Services:  Financial Services Utilized: Medicare    Choices offered to/list presented to: patient son  Follow-up services arranged:  [place]: Medi         Patient response to transportation need: Is the patient able to respond to transportation needs?: Yes In the past 12 months, has lack of transportation kept you from medical appointments or from getting medications?: No In the past 12 months, has lack of transportation kept you from meetings, work, or from getting things needed for daily living?: No    Comments (or additional information):  Patient/Family verbalized understanding of follow-up arrangements:  Yes  Individual responsible for coordination of the follow-up plan: Shanon Brow [phone]  Confirmed correct DME delivered: Dyanne Iha 07/24/2021    Dyanne Iha

## 2021-07-25 NOTE — Progress Notes (Signed)
PROGRESS NOTE   Subjective/Complaints: Feeling fair.  Still with hip soreness Ready to d/c home today Her son will be picking her up  ROS: Patient denies fever, rash, sore throat, blurred vision, dizziness, nausea, vomiting, diarrhea, cough, shortness of breath or chest pain, +difficulty urinating, +pelvic pain, +insomnia- improved   Objective:   No results found. No results for input(s): WBC, HGB, HCT, PLT in the last 72 hours.  No results for input(s): NA, K, CL, CO2, GLUCOSE, BUN, CREATININE, CALCIUM in the last 72 hours.   Intake/Output Summary (Last 24 hours) at 07/25/2021 1116 Last data filed at 07/25/2021 0809 Gross per 24 hour  Intake 320 ml  Output --  Net 320 ml        Physical Exam: Vital Signs Blood pressure (!) 126/48, pulse 74, temperature 97.7 F (36.5 C), temperature source Oral, resp. rate 18, height 4\' 11"  (1.499 m), weight 72.5 kg, SpO2 96 %. Gen: no distress, normal appearing, BMI 32.28 HEENT: oral mucosa pink and moist, NCAT Cardio: Reg rate Chest: normal effort, normal rate of breathing Abd: soft, non-distended  Ext: no clubbing, cyanosis, 1+ LE edema Psych: pleasant and cooperative, anxious  Skin: Clean and intact without signs of breakdown, scattered ecchymoses and old blisters on legs.  Neuro:  Alert and oriented x 3. Normal insight and awareness. Intact Memory. Normal language and speech. Cranial nerve exam unremarkable. UE 5/5. LE limited proximally by pain, 2-3/5 to 4/5 distally Musculoskeletal: R>L pelvic pain with bed mobility. S for assist to bathroom. Ambulating with RW with S    Assessment/Plan: 1. Functional deficits which require 3+ hours per day of interdisciplinary therapy in a comprehensive inpatient rehab setting. Physiatrist is providing close team supervision and 24 hour management of active medical problems listed below. Physiatrist and rehab team continue to assess  barriers to discharge/monitor patient progress toward functional and medical goals  Care Tool:  Bathing    Body parts bathed by patient: Right arm, Left arm, Chest, Abdomen, Right upper leg, Left upper leg, Right lower leg, Left lower leg, Face, Front perineal area, Buttocks   Body parts bathed by helper: Front perineal area, Buttocks     Bathing assist Assist Level: Supervision/Verbal cueing     Upper Body Dressing/Undressing Upper body dressing   What is the patient wearing?: Pull over shirt    Upper body assist Assist Level: Independent with assistive device    Lower Body Dressing/Undressing Lower body dressing      What is the patient wearing?: Underwear/pull up, Pants     Lower body assist Assist for lower body dressing: Supervision/Verbal cueing     Toileting Toileting    Toileting assist Assist for toileting: Supervision/Verbal cueing     Transfers Chair/bed transfer  Transfers assist     Chair/bed transfer assist level: Supervision/Verbal cueing     Locomotion Ambulation   Ambulation assist      Assist level: Supervision/Verbal cueing Assistive device: Walker-rolling Max distance: 67ft   Walk 10 feet activity   Assist     Assist level: Supervision/Verbal cueing Assistive device: Walker-rolling   Walk 50 feet activity   Assist Walk 50 feet with 2 turns activity did  not occur: Safety/medical concerns (pain)         Walk 150 feet activity   Assist Walk 150 feet activity did not occur: Safety/medical concerns (pain)         Walk 10 feet on uneven surface  activity   Assist Walk 10 feet on uneven surfaces activity did not occur: Safety/medical concerns (balance deficits)   Assist level: Supervision/Verbal cueing     Wheelchair     Assist Is the patient using a wheelchair?: Yes Type of Wheelchair: Manual    Wheelchair assist level: Supervision/Verbal cueing Max wheelchair distance: 17ft    Wheelchair 50 feet  with 2 turns activity    Assist        Assist Level: Supervision/Verbal cueing   Wheelchair 150 feet activity     Assist      Assist Level: Supervision/Verbal cueing   Blood pressure (!) 126/48, pulse 74, temperature 97.7 F (36.5 C), temperature source Oral, resp. rate 18, height 4\' 11"  (1.499 m), weight 72.5 kg, SpO2 96 %. Medical Problem List and Plan: 1. Functional deficits secondary to right superior and inferior pubic ramus fractures of unclear etiology. Pt denies recent fall.   -Partial weightbearing RLE due to close proximity of SPR fx to acetabulum             -patient may shower             -ELOS/Goals: 8 days, goals supervision with PT, sup/min OT  D/c home today  -told pt she was free to shift/move in bed as she tolerates  HFU scheduled 2.  Antithrombotics: -DVT/anticoagulation:  Pharmaceutical: Lovenox.Vascular study negative             -antiplatelet therapy: Aspirin 81 mg daily 3. Pelvic fracture pain: Hydrocodone as needed. Scheduled tylenol.  4. Mood/anxiety disorder: Ativan 0.5 mg daily as needed, pt used this at home             -low dose trazodone if needed for sleep as well  -needs regular positive reinforcement             -antipsychotic agents: N/A 5. Neuropsych: This patient is capable of making decisions on her own behalf. 6. Skin/Wound Care: Routine skin checks 7. Fluids/Electrolytes/Nutrition: Routine in and outs with follow-up chemistries 8.  AKI: Cr reviewed 1/30: normalized.  9.  PAF/SSS/PPM.  Cardizem 180 mg daily, Toprol 25 mg nightly.  Follow-up per cardiology services. Reviewed current medications and compared to home medications- discussed with patient that she is on her home meds.              -monitor activity tolerance with therapies 10.  Hyperlipidemia.  Pravachol 11.  GERD.  continue Protonix 12. Suboptimal potassium: supplemented, monitor as needed. Continue to monitor outpatient.  13. Bilateral lower extremity edema: elevate  legs, apply compression garments and ice 14. Hypotension to diastolic 41: decrease lopressor to 12.5mg  HS, improved, continue 15. Obesity: BMI 32.28: provide dietary education.     >30 minutes spent in discharge of patient including review of medications and follow-up appointments, physical examination, and in answering all patient's questions     LOS: 8 days A FACE TO FACE EVALUATION WAS [place] 07/25/2021, 11:16 AM

## 2021-07-27 ENCOUNTER — Telehealth: Payer: Self-pay

## 2021-07-27 NOTE — Telephone Encounter (Signed)
No Home Health call as of yet. Advised son to call the office back on Tuesday if has not heard anything from [facility].

## 2021-07-27 NOTE — Telephone Encounter (Signed)
Transitional Care call--Son-David    Are you/is patient experiencing any problems since coming home? No Are there any questions regarding any aspect of care?  Are there any questions regarding medications administration/dosing?  No Are meds being taken as prescribed? Yes Patient should review meds with caller to confirm Have there been any falls? No Has Home Health been to the house and/or have they contacted you? No If not, have you tried to contact them? Can we help you contact them? Are bowels and bladder emptying properly? Yes Are there any unexpected incontinence issues? No If applicable, is patient following bowel/bladder programs? Any fevers, problems with breathing, unexpected pain? No Are there any skin problems or new areas of breakdown? No Has the patient/family member arranged specialty MD follow up (ie cardiology/neurology/renal/surgical/etc)?  Yes Can we help arrange? Does the patient need any other services or support that we can help arrange? No Are caregivers following through as expected in assisting the patient? Yes Has the patient quit smoking, drinking alcohol, or using drugs as recommended? Yes  Appointment time 11:40 am, arrive time 11:20 am with Dr. Ranell Patrick on 10/12/2021 [address]

## 2021-07-28 DIAGNOSIS — N179 Acute kidney failure, unspecified: Secondary | ICD-10-CM | POA: Diagnosis not present

## 2021-07-28 DIAGNOSIS — Z950 Presence of cardiac pacemaker: Secondary | ICD-10-CM | POA: Diagnosis not present

## 2021-07-28 DIAGNOSIS — J309 Allergic rhinitis, unspecified: Secondary | ICD-10-CM | POA: Diagnosis not present

## 2021-07-28 DIAGNOSIS — I442 Atrioventricular block, complete: Secondary | ICD-10-CM | POA: Diagnosis not present

## 2021-07-28 DIAGNOSIS — I495 Sick sinus syndrome: Secondary | ICD-10-CM | POA: Diagnosis not present

## 2021-07-28 DIAGNOSIS — I129 Hypertensive chronic kidney disease with stage 1 through stage 4 chronic kidney disease, or unspecified chronic kidney disease: Secondary | ICD-10-CM | POA: Diagnosis not present

## 2021-07-28 DIAGNOSIS — F39 Unspecified mood [affective] disorder: Secondary | ICD-10-CM | POA: Diagnosis not present

## 2021-07-28 DIAGNOSIS — Z8701 Personal history of pneumonia (recurrent): Secondary | ICD-10-CM | POA: Diagnosis not present

## 2021-07-28 DIAGNOSIS — F419 Anxiety disorder, unspecified: Secondary | ICD-10-CM | POA: Diagnosis not present

## 2021-07-28 DIAGNOSIS — Z7901 Long term (current) use of anticoagulants: Secondary | ICD-10-CM | POA: Diagnosis not present

## 2021-07-28 DIAGNOSIS — E782 Mixed hyperlipidemia: Secondary | ICD-10-CM | POA: Diagnosis not present

## 2021-07-28 DIAGNOSIS — M549 Dorsalgia, unspecified: Secondary | ICD-10-CM | POA: Diagnosis not present

## 2021-07-28 DIAGNOSIS — S32501D Unspecified fracture of right pubis, subsequent encounter for fracture with routine healing: Secondary | ICD-10-CM | POA: Diagnosis not present

## 2021-07-28 DIAGNOSIS — Z9049 Acquired absence of other specified parts of digestive tract: Secondary | ICD-10-CM | POA: Diagnosis not present

## 2021-07-28 DIAGNOSIS — E876 Hypokalemia: Secondary | ICD-10-CM | POA: Diagnosis not present

## 2021-07-28 DIAGNOSIS — Z7982 Long term (current) use of aspirin: Secondary | ICD-10-CM | POA: Diagnosis not present

## 2021-07-28 DIAGNOSIS — N1832 Chronic kidney disease, stage 3b: Secondary | ICD-10-CM | POA: Diagnosis not present

## 2021-07-28 DIAGNOSIS — E871 Hypo-osmolality and hyponatremia: Secondary | ICD-10-CM | POA: Diagnosis not present

## 2021-07-28 DIAGNOSIS — I480 Paroxysmal atrial fibrillation: Secondary | ICD-10-CM | POA: Diagnosis not present

## 2021-07-28 DIAGNOSIS — G8929 Other chronic pain: Secondary | ICD-10-CM | POA: Diagnosis not present

## 2021-07-28 DIAGNOSIS — K219 Gastro-esophageal reflux disease without esophagitis: Secondary | ICD-10-CM | POA: Diagnosis not present

## 2021-07-28 DIAGNOSIS — D631 Anemia in chronic kidney disease: Secondary | ICD-10-CM | POA: Diagnosis not present

## 2021-07-29 ENCOUNTER — Other Ambulatory Visit: Payer: Self-pay

## 2021-07-29 ENCOUNTER — Ambulatory Visit (INDEPENDENT_AMBULATORY_CARE_PROVIDER_SITE_OTHER): Payer: Medicare Other | Admitting: Family Medicine

## 2021-07-29 ENCOUNTER — Encounter: Payer: Self-pay | Admitting: Family Medicine

## 2021-07-29 VITALS — BP 156/82 | HR 79

## 2021-07-29 DIAGNOSIS — R7989 Other specified abnormal findings of blood chemistry: Secondary | ICD-10-CM | POA: Diagnosis not present

## 2021-07-29 DIAGNOSIS — N179 Acute kidney failure, unspecified: Secondary | ICD-10-CM | POA: Diagnosis not present

## 2021-07-29 DIAGNOSIS — S32810D Multiple fractures of pelvis with stable disruption of pelvic ring, subsequent encounter for fracture with routine healing: Secondary | ICD-10-CM | POA: Diagnosis not present

## 2021-07-29 DIAGNOSIS — R600 Localized edema: Secondary | ICD-10-CM

## 2021-07-29 DIAGNOSIS — R102 Pelvic and perineal pain: Secondary | ICD-10-CM | POA: Diagnosis not present

## 2021-07-29 NOTE — Patient Instructions (Signed)
Please return in 3 months for recheck. Sooner if there are problems.   I will release your lab results to you on your MyChart account with further instructions. Please reply with any questions.    Please take lasix 40mg  twice a day for the next 3 days; take a potassium pill twice a day along with it for 3 days. Weigh yourself daily and elevated the legs as much as you can tolerate. Eat a low sodium diet and limit the amount of fluid you drink daily to about 1.5ltrs.   See Dr. Alvan Dame and Dr. Loletha Grayer for follow up.   If you have any questions or concerns, please don't hesitate to send me a message via MyChart or call the office at [phone]. Thank you for visiting with [name] today! It's our pleasure caring for you.

## 2021-07-30 LAB — URINALYSIS, ROUTINE W REFLEX MICROSCOPIC
Bilirubin Urine: NEGATIVE
Hgb urine dipstick: NEGATIVE
Ketones, ur: NEGATIVE
Nitrite: NEGATIVE
Specific Gravity, Urine: <=1.005 — AB (ref 1.000–1.030)
Total Protein, Urine: NEGATIVE
Urine Glucose: NEGATIVE
Urobilinogen, UA: 0.2 (ref 0.0–1.0)
pH: 7 (ref 5.0–8.0)

## 2021-07-30 LAB — URINE CULTURE
MICRO NUMBER:: 12981239
Result:: NO GROWTH
SPECIMEN QUALITY:: ADEQUATE

## 2021-07-30 LAB — COMPREHENSIVE METABOLIC PANEL
ALT: 30 U/L (ref 0–35)
AST: 20 U/L (ref 0–37)
Albumin: 4 g/dL (ref 3.5–5.2)
Alkaline Phosphatase: 93 U/L (ref 39–117)
BUN: 19 mg/dL (ref 6–23)
CO2: 30 mEq/L (ref 19–32)
Calcium: 9.6 mg/dL (ref 8.4–10.5)
Chloride: 93 mEq/L — ABNORMAL LOW (ref 96–112)
Creatinine, Ser: 1 mg/dL (ref 0.40–1.20)
GFR: 50.56 mL/min — ABNORMAL LOW (ref >60.00)
Glucose, Bld: 103 mg/dL — ABNORMAL HIGH (ref 70–99)
Potassium: 4.1 mEq/L (ref 3.5–5.1)
Sodium: 133 mEq/L — ABNORMAL LOW (ref 135–145)
Total Bilirubin: 0.5 mg/dL (ref 0.2–1.2)
Total Protein: 7.1 g/dL (ref 6.0–8.3)

## 2021-07-30 NOTE — Progress Notes (Signed)
Subjective  CC:  Chief Complaint  Patient presents with   Hypothyroidism   broken pelvis    Broken pelvis found on Jan 10th, denies any falls. Unsure of how the break happened. She is not seeing ortho and no f/u since being discharged from the hospital     HPI: [name] is a 88 y.o. female who presents to the office today to address the problems listed above in the chief complaint. 88 year old discharged 4 days ago from rehab after hospitalization for fractured pelvis and lower extremity edema.  She was hospitalized January 23 to January 27 and then admitted to rehab from the 27th to February 4.  I reviewed both discharge summaries and details.  She had to rami pelvic fractures.  She also had acute on chronic kidney failure and severe bilateral leg edema.  During her hospital course, blood pressure medicines were changed due to acute renal failure.  ARB and HCTZ were on hold.  She continues on Cardizem and metoprolol for blood pressure control and history of PAF.  She is on Lasix but the dosage has been adjusted downward.  She complains of persistent edema.  Continues to have some soreness and difficulty ambulating but home physical therapy is active.  Kidney function had improved.  She did have a normocytic anemia likely from chronic kidney disease.  No overt bleeding found. Currently she is uncomfortable and frustrated.  She reports that Tylenol does control her pelvic soreness.  She is doing her best to improve ambulation with physical therapy.  She would like to improve her lower extremity edema.  She has not made follow-up appointments with orthopedics yet.  She struggles trying to understand how the pelvic fractures occurred.  She is concerned that her treatment at the orthopedic office when she was being assisted onto the exam table was a bit harsh and this could have caused the problems.  Of note, she was scheduled to have a bone density to assess for osteoporosis but was unable to get  scheduled prior to this hospitalization. She also complains of bladder pressure over the last few days.  No true dysuria or irritative symptoms.  Would like urine checked. Reviewed most recent lab work.  Lab Results  Component Value Date   CREATININE 1.00 07/29/2021   BUN 19 07/29/2021   NA 133 (L) 07/29/2021   K 4.1 07/29/2021   CL 93 (L) 07/29/2021   CO2 30 07/29/2021   Lab Results  Component Value Date   WBC 7.8 07/20/2021   HGB 10.5 (L) 07/20/2021   HCT 31.8 (L) 07/20/2021   MCV 94.1 07/20/2021   PLT 225 07/20/2021   Lab Results  Component Value Date   ALT 30 07/29/2021   AST 20 07/29/2021   ALKPHOS 93 07/29/2021   BILITOT 0.5 07/29/2021   Lab Results  Component Value Date   CHOL 144 04/27/2021   HDL 76.10 04/27/2021   LDLCALC 57 04/27/2021   TRIG 55.0 04/27/2021   CHOLHDL 2 04/27/2021   Lab Results  Component Value Date   TSH 4.64 04/27/2021    Assessment  1. Multiple closed fractures of pelvis with stable disruption of pelvic ring with routine healing, subsequent encounter   2. AKI (acute kidney injury) on CKD 3a ([place])   3. Bilateral edema of lower extremity   4. Suprapubic pressure   5. Elevated liver function tests      Plan  Pelvic fractures: Continue partial weightbearing, physical therapy and recommend follow-up with orthopedics for  further recommendations.  Tylenol for pain.  Empathized Acute on chronic kidney failure: Improved with hydration.  Will monitor. Bilateral lower extremity edema: Increase Lasix to twice daily for the next 3 days, leg elevation, low-salt diet and monitor.  Recommend follow-up with cardiology further input. Hypertension with mildly elevated reading today.  She her ARB and HCTZ has been held.  Will monitor leg edema, follow-up on electrolytes and renal function and then decide whether we should restart that.  Cardiology can adjust as well. Check urinalysis given suprapubic symptoms We will recheck liver test and  follow-up.  Follow up: 3 months for recheck  10/28/2021  Orders Placed This Encounter  Procedures   Urine Culture   Comprehensive metabolic panel   Urinalysis, Routine w reflex microscopic   No orders of the defined types were placed in this encounter.     I reviewed the patients updated PMH, FH, and SocHx.    Patient Active Problem List   Diagnosis Date Noted   SSS (sick sinus syndrome) ([place]) 06/15/2017    Priority: High   PAT (paroxysmal atrial tachycardia) ([place]) 06/15/2017    Priority: High   Impaired fasting glucose 07/27/2016    Priority: High   CHB (complete heart block) ([place]) s/p pacemaker 05/17/2013    Priority: High   Paroxysmal atrial fibrillation ([place]) 05/17/2013    Priority: High   Pacemaker 02/04/2013    Priority: High   Essential hypertension 02/04/2013    Priority: High   Mixed hyperlipidemia 02/04/2013    Priority: High   Early stage nonexudative age-related macular degeneration of right eye 03/06/2020    Priority: Medium    Early stage nonexudative age-related macular degeneration of left eye 03/06/2020    Priority: Medium    Laryngopharyngeal reflux (LPR) 03/11/2017    Priority: Medium    Primary hypothyroidism 07/27/2016    Priority: Medium    Lower extremity edema 11/22/2014    Priority: Medium    Asthma 02/20/2010    Priority: Medium    GERD (gastroesophageal reflux disease) 10/26/2007    Priority: Medium    Chronic cough 10/26/2007    Priority: Medium    Choroidal nevus of right eye 03/06/2020    Priority: Low   Pseudophakia 03/06/2020    Priority: Low   Vitamin D deficiency 07/27/2016    Priority: Low   Allergic rhinitis due to pollen 08/21/2010    Priority: Low   Pelvic fracture ([place]) 07/17/2021   Hyponatremia 07/14/2021   Normocytic anemia 07/14/2021   Hypokalemia 07/14/2021   AKI (acute kidney injury) on CKD 3a ([place]) 07/13/2021   Fracture of right superior pubic ramus, closed, initial encounter ([place]) 07/13/2021   Stage 3a  chronic kidney disease (CKD) ([place]) 04/27/2021   Osteopenia 04/27/2021   Adjustment disorder with anxiety 04/27/2021   Current Meds  Medication Sig   acetaminophen (TYLENOL) 325 MG tablet Take 2 tablets (650 mg total) by mouth 3 (three) times daily.   Ascorbic Acid (VITAMIN C) 100 MG tablet 500 mg daily.   aspirin 81 MG chewable tablet Chew 81 mg by mouth at bedtime.   calcium carbonate (TUMS EX) 750 MG chewable tablet Chew 1 tablet by mouth daily as needed for heartburn.   cetirizine (ZYRTEC) 10 MG tablet 1 tablet   cholecalciferol (VITAMIN D3) 25 MCG (1000 UNIT) tablet Take 1 tablet (1,000 Units total) by mouth daily.   diltiazem (CARDIZEM CD) 180 MG 24 hr capsule Take 1 capsule (180 mg total) by mouth at bedtime.  docusate sodium (COLACE) 100 MG capsule Take 1 capsule (100 mg total) by mouth 2 (two) times daily.   furosemide (LASIX) 40 MG tablet Take 1 tablet (40 mg total) by mouth daily.   LORazepam (ATIVAN) 0.5 MG tablet Take 1 tablet (0.5 mg total) by mouth daily as needed for anxiety.   melatonin 3 MG TABS tablet Take 1 tablet (3 mg total) by mouth at bedtime.   metoprolol succinate (TOPROL-XL) 25 MG 24 hr tablet Take 0.5 tablets (12.5 mg total) by mouth at bedtime.   Multiple Vitamin (MULTIVITAMIN WITH MINERALS) TABS tablet Take 1 tablet by mouth daily.   pantoprazole (PROTONIX) 40 MG tablet Take 2 tablets (80 mg total) by mouth daily.   polyethylene glycol (MIRALAX) 17 g packet Take 17 g by mouth daily as needed for mild constipation.   potassium chloride SA (KLOR-CON M20) 20 MEQ tablet Take 1 tablet (20 mEq total) by mouth daily.   pravastatin (PRAVACHOL) 40 MG tablet Take 1 tablet (40 mg total) by mouth at bedtime.   [DISCONTINUED] HYDROcodone-acetaminophen (NORCO/VICODIN) 5-325 MG tablet Take 1-2 tablets by mouth every 6 (six) hours as needed for moderate pain.    Allergies: Patient is allergic to fluticasone-salmeterol, sulfa antibiotics, sulfonamide derivatives, benzonatate,  erythromycin, meloxicam, and sulfamethoxazole. Family History: Patient family history includes Cancer in her father; Stroke in her mother and sister. Social History:  Patient  reports that she has never smoked. She has never used smokeless tobacco. She reports that she does not drink alcohol and does not use drugs.  Review of Systems: Constitutional: Negative for fever malaise or anorexia Cardiovascular: negative for chest pain Respiratory: negative for SOB or persistent cough Gastrointestinal: negative for abdominal pain  Objective  Vitals: BP (!) 156/82    Pulse 79    SpO2 95%  General: no acute distress , A&Ox3 HEENT: PEER[place], conjunctiva normal, neck is supple Cardiovascular:  RRR  Respiratory:  Good breath sounds bilaterally, CTAB with normal respiratory effort, no rales Skin:  Warm, no rashes Lower extremities: 2+ bilateral pitting edema to knees     Commons side effects, risks, benefits, and alternatives for medications and treatment plan prescribed today were discussed, and the patient expressed understanding of the given instructions. Patient is instructed to call or message via MyChart if he/she has any questions or concerns regarding our treatment plan. No barriers to understanding were identified. We discussed Red Flag symptoms and signs in detail. Patient expressed understanding regarding what to do in case of urgent or emergency type symptoms.  Medication list was reconciled, printed and provided to the patient in AVS. Patient instructions and summary information was reviewed with the patient as documented in the AVS. This note was prepared with assistance of Dragon voice recognition software. Occasional wrong-word or sound-a-like substitutions may have occurred due to the inherent limitations of voice recognition software  This visit occurred during the SARS-CoV-2 public health emergency.  Safety protocols were in place, including screening questions prior to the visit,  additional usage of staff PPE, and extensive cleaning of exam room while observing appropriate contact time as indicated for disinfecting solutions.

## 2021-07-31 NOTE — Progress Notes (Signed)
Spoke with patient, Gave verbalized understanding.  °

## 2021-07-31 NOTE — Progress Notes (Signed)
Please call patient: I have reviewed his/her lab results.  Blood work is stable.  Kidney function and liver function tests have improved.  She should be able to tolerate the increased Lasix.  Please let us know if swelling has not improved after increasing the dose for 3 days.  Her urine does not show infection.  Follow-up as directed

## 2021-08-03 ENCOUNTER — Telehealth: Payer: Self-pay

## 2021-08-03 ENCOUNTER — Telehealth: Payer: Self-pay | Admitting: Cardiovascular Disease

## 2021-08-03 ENCOUNTER — Other Ambulatory Visit: Payer: Self-pay

## 2021-08-03 DIAGNOSIS — D631 Anemia in chronic kidney disease: Secondary | ICD-10-CM | POA: Diagnosis not present

## 2021-08-03 DIAGNOSIS — I129 Hypertensive chronic kidney disease with stage 1 through stage 4 chronic kidney disease, or unspecified chronic kidney disease: Secondary | ICD-10-CM | POA: Diagnosis not present

## 2021-08-03 DIAGNOSIS — E871 Hypo-osmolality and hyponatremia: Secondary | ICD-10-CM | POA: Diagnosis not present

## 2021-08-03 DIAGNOSIS — E782 Mixed hyperlipidemia: Secondary | ICD-10-CM | POA: Diagnosis not present

## 2021-08-03 DIAGNOSIS — N1832 Chronic kidney disease, stage 3b: Secondary | ICD-10-CM | POA: Diagnosis not present

## 2021-08-03 DIAGNOSIS — S32501D Unspecified fracture of right pubis, subsequent encounter for fracture with routine healing: Secondary | ICD-10-CM | POA: Diagnosis not present

## 2021-08-03 MED ORDER — DILTIAZEM HCL ER COATED BEADS 180 MG PO CP24: 180.0000 mg | ORAL_CAPSULE | Freq: Every day | ORAL | 3 refills | Status: DC

## 2021-08-03 NOTE — Telephone Encounter (Signed)
°*  STAT* If patient is at the pharmacy, call can be transferred to refill team.   1. Which medications need to be refilled? (please list name of each medication and dose if known)  diltiazem (CARDIZEM CD) 180 MG 24 hr capsule  2. Which pharmacy/location (including street and city if local pharmacy) is medication to be sent to [place], [place] [address]       3. Do they need a 30 day or 90 day supply? 90 with refills  Patient was recently released from the hospital and is just now getting caught up with her medications   Patient is out of medication

## 2021-08-03 NOTE — Telephone Encounter (Signed)
Called patient too advise refill sent to pharmacy.

## 2021-08-04 ENCOUNTER — Telehealth: Payer: Self-pay | Admitting: Family Medicine

## 2021-08-04 DIAGNOSIS — D631 Anemia in chronic kidney disease: Secondary | ICD-10-CM | POA: Diagnosis not present

## 2021-08-04 DIAGNOSIS — E782 Mixed hyperlipidemia: Secondary | ICD-10-CM | POA: Diagnosis not present

## 2021-08-04 DIAGNOSIS — I129 Hypertensive chronic kidney disease with stage 1 through stage 4 chronic kidney disease, or unspecified chronic kidney disease: Secondary | ICD-10-CM | POA: Diagnosis not present

## 2021-08-04 DIAGNOSIS — E871 Hypo-osmolality and hyponatremia: Secondary | ICD-10-CM | POA: Diagnosis not present

## 2021-08-04 DIAGNOSIS — N1832 Chronic kidney disease, stage 3b: Secondary | ICD-10-CM | POA: Diagnosis not present

## 2021-08-04 DIAGNOSIS — S32501D Unspecified fracture of right pubis, subsequent encounter for fracture with routine healing: Secondary | ICD-10-CM | POA: Diagnosis not present

## 2021-08-04 NOTE — Telephone Encounter (Signed)
LVM to return call.

## 2021-08-04 NOTE — Telephone Encounter (Signed)
[place], Occupational Therapist, with [facility] has completed evaluation with pt. He is now needing verbal orders to continue therapy. Please call [phone]

## 2021-08-05 NOTE — Telephone Encounter (Signed)
Patient has follow up on 2/20 with Dr. Sallyanne Kuster

## 2021-08-06 DIAGNOSIS — N1832 Chronic kidney disease, stage 3b: Secondary | ICD-10-CM | POA: Diagnosis not present

## 2021-08-06 DIAGNOSIS — D631 Anemia in chronic kidney disease: Secondary | ICD-10-CM | POA: Diagnosis not present

## 2021-08-06 DIAGNOSIS — S32501D Unspecified fracture of right pubis, subsequent encounter for fracture with routine healing: Secondary | ICD-10-CM | POA: Diagnosis not present

## 2021-08-06 DIAGNOSIS — E871 Hypo-osmolality and hyponatremia: Secondary | ICD-10-CM | POA: Diagnosis not present

## 2021-08-06 DIAGNOSIS — E782 Mixed hyperlipidemia: Secondary | ICD-10-CM | POA: Diagnosis not present

## 2021-08-06 DIAGNOSIS — I129 Hypertensive chronic kidney disease with stage 1 through stage 4 chronic kidney disease, or unspecified chronic kidney disease: Secondary | ICD-10-CM | POA: Diagnosis not present

## 2021-08-10 ENCOUNTER — Other Ambulatory Visit: Payer: Self-pay

## 2021-08-10 ENCOUNTER — Ambulatory Visit (INDEPENDENT_AMBULATORY_CARE_PROVIDER_SITE_OTHER): Payer: Medicare Other | Admitting: Cardiovascular Disease

## 2021-08-10 ENCOUNTER — Encounter: Payer: Self-pay | Admitting: Cardiovascular Disease

## 2021-08-10 VITALS — BP 122/55 | HR 89 | Ht 59.0 in | Wt 145.0 lb

## 2021-08-10 DIAGNOSIS — N1832 Chronic kidney disease, stage 3b: Secondary | ICD-10-CM | POA: Diagnosis not present

## 2021-08-10 DIAGNOSIS — E782 Mixed hyperlipidemia: Secondary | ICD-10-CM | POA: Diagnosis not present

## 2021-08-10 DIAGNOSIS — E7800 Pure hypercholesterolemia, unspecified: Secondary | ICD-10-CM | POA: Diagnosis not present

## 2021-08-10 DIAGNOSIS — I471 Supraventricular tachycardia: Secondary | ICD-10-CM

## 2021-08-10 DIAGNOSIS — I495 Sick sinus syndrome: Secondary | ICD-10-CM

## 2021-08-10 DIAGNOSIS — E871 Hypo-osmolality and hyponatremia: Secondary | ICD-10-CM | POA: Diagnosis not present

## 2021-08-10 DIAGNOSIS — S32501D Unspecified fracture of right pubis, subsequent encounter for fracture with routine healing: Secondary | ICD-10-CM | POA: Diagnosis not present

## 2021-08-10 DIAGNOSIS — Z950 Presence of cardiac pacemaker: Secondary | ICD-10-CM

## 2021-08-10 DIAGNOSIS — I442 Atrioventricular block, complete: Secondary | ICD-10-CM | POA: Diagnosis not present

## 2021-08-10 DIAGNOSIS — Z8679 Personal history of other diseases of the circulatory system: Secondary | ICD-10-CM | POA: Diagnosis not present

## 2021-08-10 DIAGNOSIS — I129 Hypertensive chronic kidney disease with stage 1 through stage 4 chronic kidney disease, or unspecified chronic kidney disease: Secondary | ICD-10-CM | POA: Diagnosis not present

## 2021-08-10 DIAGNOSIS — R600 Localized edema: Secondary | ICD-10-CM | POA: Diagnosis not present

## 2021-08-10 DIAGNOSIS — I10 Essential (primary) hypertension: Secondary | ICD-10-CM | POA: Diagnosis not present

## 2021-08-10 DIAGNOSIS — D631 Anemia in chronic kidney disease: Secondary | ICD-10-CM | POA: Diagnosis not present

## 2021-08-10 MED ORDER — POTASSIUM CHLORIDE CRYS ER 20 MEQ PO TBCR: 20.0000 meq | EXTENDED_RELEASE_TABLET | Freq: Every day | ORAL | 11 refills | Status: DC

## 2021-08-10 MED ORDER — FUROSEMIDE 40 MG PO TABS: 40.0000 mg | ORAL_TABLET | Freq: Every day | ORAL | 11 refills | Status: DC

## 2021-08-10 MED ORDER — DILTIAZEM HCL ER COATED BEADS 120 MG PO CP24: 120.0000 mg | ORAL_CAPSULE | Freq: Every day | ORAL | 3 refills | Status: DC

## 2021-08-10 MED ORDER — METOPROLOL SUCCINATE ER 25 MG PO TB24: 25.0000 mg | ORAL_TABLET | Freq: Every day | ORAL | 3 refills | Status: DC

## 2021-08-10 NOTE — Progress Notes (Signed)
Patient ID: [name], female   DOB: 19-May-1932, 88 y.o.   MRN: [id number]    Cardiology Office Note    Date:  08/10/2021   ID:  [name], DOB 1931/11/27, MRN [id number]  PCP:  Leamon Arnt, MD  Cardiologist: Shelva Majestic, M.D.;  Sanda Klein, MD   Chief Complaint  Patient presents with   Pacemaker Check   Leg Swelling    History of Present Illness:  [name] is a 88 y.o. female who returns in follow-up for sinus node dysfunction, complete heart block, paroxysmal atrial fibrillation and pacemaker.    She was hospitalized on January 23 with bilateral lower extremity edema and hip pain and was found to have a bilateral superior pubic ramus fracture.  This was managed medically.  From the hospital she went to inpatient rehab and was discharged on February 4.  She is continue to receive physical therapy and is walking very slowly with a walker.  She continues to have intermittent problems with lower extremity edema.  The couple of days ago this was almost completely resolved, but this morning she has some swelling again.  She denies shortness of breath at rest or with activity.  She does not have chest pain, palpitations, dizziness or syncope.  During her hospital stay she underwent an echocardiogram that showed normal left ventricular systolic function and no significant valvular abnormalities.  Pacemaker interrogation shows normal device function with approximately 4.5-5 years of estimated generator longevity.  Her device generator (Saint Jude Assurity implanted in 2018) is MRI conditional, but the leads implanted in 2009 are not.  All lead parameters are excellent.  She has had a handful of episodes of very brief paroxysmal atrial tachycardia, none over 20 seconds in duration.  There is no evidence of atrial fibrillation.  She is pacemaker dependent.  She has 67% atrial pacing and virtually 100% ventricular pacing.  Her initial pacemaker was implanted for complete heart block  in 2009.  She underwent pacemaker change out for ERI in August 2018 ([facility], but leads are from 2009, not MRI conditional).  She has developed symptoms of sinus node dysfunction as well.   Past Medical History:  Diagnosis Date   Allergic rhinitis    Anginal pain (HCC)    Asthmatic bronchitis    Chronic cough    Dysrhythmia    PAF   Esophageal reflux    GERD (gastroesophageal reflux disease)    History of stress test 11/20/2009   This was essentially normal,post-stress EF was hyperdynamic at 73 beats per mins, There was no scar or ischemia.   Hx of echocardiogram 11/05/2010   EF>55% showed a normal systolic function with impaired diastolic dysfuntion and also tissue doppler suggesting increased elevated left atrial pressure, she had moderate LA dilatation and mild LA dilatation. There was calcified mitral apparatus with moderate MR, mild to moderate TR and trace aortic insufficiency.   Hyperlipemia    Hypertension    Hypothyroidism    PAF (paroxysmal atrial fibrillation) ([place])    Pneumonia     Past Surgical History:  Procedure Laterality Date   CHOLECYSTECTOMY     CYSTOSCOPY WITH BIOPSY N/A 08/29/2020   Procedure: CYSTOSCOPY WITH BIOPSY WITH FULGURATION;  Surgeon: Irine Seal, MD;  Location: [facility];  Service: Urology;  Laterality: N/A;   FOOT SURGERY     right   NASAL SEPTUM SURGERY     PACEMAKER INSERTION  June 1st 2009   St Jude [place] device, model #5826, serial #  [id number], The artial lead is a Interior and spatial designer, the ventricular lead is Environmental education officer.   PPM GENERATOR CHANGEOUT N/A 02/09/2017   Procedure: PPM GENERATOR CHANGEOUT;  Surgeon: Sanda Klein, MD;  Location: [facility];  Service: Cardiovascular;  Laterality: N/A;   THYROID SURGERY     TONSILLECTOMY     TONSILLECTOMY     VESICOVAGINAL FISTULA CLOSURE W/ TAH      Current Outpatient Medications  Medication Sig Dispense Refill   acetaminophen (TYLENOL) 325 MG tablet Take 2 tablets (650 mg total)  by mouth 3 (three) times daily.     Ascorbic Acid (VITAMIN C) 100 MG tablet 500 mg daily.     aspirin 81 MG chewable tablet Chew 81 mg by mouth at bedtime.     calcium carbonate (TUMS EX) 750 MG chewable tablet Chew 1 tablet by mouth daily as needed for heartburn.     cholecalciferol (VITAMIN D3) 25 MCG (1000 UNIT) tablet Take 1 tablet (1,000 Units total) by mouth daily. 30 tablet 0   docusate sodium (COLACE) 100 MG capsule Take 1 capsule (100 mg total) by mouth 2 (two) times daily. 10 capsule 0   pantoprazole (PROTONIX) 40 MG tablet Take 2 tablets (80 mg total) by mouth daily. 60 tablet 0   pravastatin (PRAVACHOL) 40 MG tablet Take 1 tablet (40 mg total) by mouth at bedtime. 30 tablet 0   valsartan-hydrochlorothiazide (DIOVAN-HCT) 160-25 MG tablet Take 1 tablet by mouth daily.     cetirizine (ZYRTEC) 10 MG tablet 1 tablet (Patient not taking: Reported on 08/10/2021)     diltiazem (CARDIZEM CD) 120 MG 24 hr capsule Take 1 capsule (120 mg total) by mouth at bedtime. 90 capsule 3   furosemide (LASIX) 40 MG tablet Take 1 tablet (40 mg total) by mouth daily. May take one extra tablet daily as needed for edema. 35 tablet 11   LORazepam (ATIVAN) 0.5 MG tablet Take 1 tablet (0.5 mg total) by mouth daily as needed for anxiety. (Patient not taking: Reported on 08/10/2021) 10 tablet 0   melatonin 3 MG TABS tablet Take 1 tablet (3 mg total) by mouth at bedtime. (Patient not taking: Reported on 08/10/2021) 30 tablet 0   metoprolol succinate (TOPROL-XL) 25 MG 24 hr tablet Take 1 tablet (25 mg total) by mouth at bedtime. 90 tablet 3   Multiple Vitamin (MULTIVITAMIN WITH MINERALS) TABS tablet Take 1 tablet by mouth daily. (Patient not taking: Reported on 08/10/2021) 90 tablet 0   polyethylene glycol (MIRALAX) 17 g packet Take 17 g by mouth daily as needed for mild constipation. (Patient not taking: Reported on 08/10/2021) 14 each 0   potassium chloride SA (KLOR-CON M20) 20 MEQ tablet Take 1 tablet (20 mEq total) by  mouth daily. Take an extra tablet once daily when you take the extra furosemide 35 tablet 11   No current facility-administered medications for this visit.    Allergies:   Fluticasone-salmeterol, Sulfa antibiotics, Sulfonamide derivatives, Benzonatate, Erythromycin, Meloxicam, and Sulfamethoxazole   Social History   Socioeconomic History   Marital status: Widowed    Spouse name: Not on file   Number of children: Not on file   Years of education: Not on file   Highest education level: Not on file  Occupational History    Employer: RETIRED    Comment: Retired Sales promotion account executive at [employer]  Tobacco Use   Smoking status: Never   Smokeless tobacco: Never  Vaping Use   Vaping Use: Never used  Substance and Sexual Activity   Alcohol use: No   Drug use: No   Sexual activity: Not on file  Other Topics Concern   Not on file  Social History Narrative   Not on file   Social Determinants of Health   Financial Resource Strain: Not on file  Food Insecurity: Not on file  Transportation Needs: Not on file  Physical Activity: Not on file  Stress: Not on file  Social Connections: Not on file     Family History:  The patient's family history includes Cancer in her father; Stroke in her mother and sister.   ROS:   Please see the history of present illness.    Review of Systems  Musculoskeletal:  Positive for arthritis, back pain, muscle cramps and stiffness.  All other systems reviewed and are negative.   PHYSICAL EXAM:   VS:  BP (!) 122/55    Pulse 89    Ht 4\' 11"  (1.499 m)    Wt 145 lb (65.8 kg)    SpO2 97%    BMI 29.29 kg/m      General: Alert, oriented x3, no distress, healthy right subclavian pacemaker site Head: no evidence of trauma, PERRL, EOMI, no exophtalmos or lid lag, no myxedema, no xanthelasma; normal ears, nose and oropharynx Neck: normal jugular venous pulsations and no hepatojugular reflux; brisk carotid pulses without delay and no carotid  bruits Chest: clear to auscultation, no signs of consolidation by percussion or palpation, normal fremitus, symmetrical and full respiratory excursions Cardiovascular: normal position and quality of the apical impulse, regular rhythm, normal first and second heart sounds, 1/6 systolic ejection murmur heard best to the left of the sternal border no diastolic murmurs, rubs or gallops Abdomen: no tenderness or distention, no masses by palpation, no abnormal pulsatility or arterial bruits, normal bowel sounds, no hepatosplenomegaly Extremities: no clubbing, cyanosis or edema; 2+ radial, ulnar and brachial pulses bilaterally; 2+ right femoral, posterior tibial and dorsalis pedis pulses; 2+ left femoral, posterior tibial and dorsalis pedis pulses; no subclavian or femoral bruits Neurological: grossly nonfocal Psych: Normal mood and affect    Wt Readings from Last 3 Encounters:  08/10/21 145 lb (65.8 kg)  07/17/21 159 lb 13.3 oz (72.5 kg)  07/14/21 154 lb 5.2 oz (70 kg)      Studies/Labs Reviewed:   ECHO 07/15/2021   1. Left ventricular ejection fraction, by estimation, is 60 to 65%. The left ventricle has normal function. The left ventricle has no regional wall motion abnormalities. There is mild left ventricular hypertrophy. Left ventricular diastolic parameters are consistent with Grade I diastolic dysfunction (impaired relaxation).   2. Right ventricular systolic function is normal. The right ventricular size is normal. The estimated right ventricular systolic pressure is 85.2 mmHg.   3. Left atrial size was mildly dilated.   4. The mitral valve is normal in structure. Trivial mitral valve  regurgitation. No evidence of mitral stenosis.   5. The aortic valve is tricuspid. Aortic valve regurgitation is not  visualized. No aortic stenosis is present.   6. The inferior vena cava is normal in size with greater than 50%  respiratory variability, suggesting right atrial pressure of 3 mmHg.    EKG:  EKG is not ordered today.  Review of the ECG from 07/15/2021 shows atrial sensed (sinus), ventricular paced rhythm.  Recent Labs: 04/27/2021: TSH 4.64 07/13/2021: B Natriuretic Peptide 23.5 07/15/2021: Magnesium 1.9 07/20/2021: Hemoglobin 10.5; Platelets 225 07/29/2021: ALT 30; BUN 19; Creatinine, Ser 1.00; Potassium 4.1;  Sodium 133    Lipid Panel    Component Value Date/Time   CHOL 144 04/27/2021 1035   TRIG 55.0 04/27/2021 1035   HDL 76.10 04/27/2021 1035   CHOLHDL 2 04/27/2021 1035   VLDL 11.0 04/27/2021 1035   LDLCALC 57 04/27/2021 1035    ASSESSMENT:    1. CHB (complete heart block) ([place])   2. SSS (sick sinus syndrome) ([place])   3. Pacemaker   4. PAT (paroxysmal atrial tachycardia) ([place])   5. History of atrial fibrillation   6. Essential hypertension   7. Pure hypercholesterolemia   8. Lower extremity edema        PLAN:  In order of problems listed above:  CHB: No underlying escape rhythm today.  Pacemaker dependent SSS: Appropriate sensor settings, good heart rate histogram distribution considering how sedentary she has been. PPM: Normal device function.  Remote downloads every 3 months. PAT: Rare, brief, asymptomatic events.  On metoprolol and diltiazem. AFib: Recorded as part of her medical record, but has not been seen during pacemaker checks for the last 10 years.  Not on anticoagulation. HTN: Well-controlled. HLP: Well-controlled on current medications. Edema: We will decrease the diltiazem and increase the metoprolol in response to that.  Echo shows normal left ventricular filling pressures and her BNP was recently very low.  Avoid sodium rich foods.  Okay to take additional furosemide intermittently for worsening edema, take extra potassium with it.  Medication Adjustments/Labs and Tests Ordered: Current medicines are reviewed at length with the patient today.  Concerns regarding medicines are outlined above.  Medication changes, Labs and Tests ordered  today are listed below. Patient Instructions  Medication Instructions:  INCREASE the Metoprolol Succinate to 25 mg once daily DECREASE the Diltiazem to 120 mg once daily  FUROSEMIDE: Take one 40 mg tablet daily. You may take and extra 40 mg tablet daily as needed for edema. If you take and extra Furosemide, take an extra Potassium as well.  *If you need a refill on your cardiac medications before your next appointment, please call your pharmacy*   Lab Work: None ordered If you have labs (blood work) drawn today and your tests are completely normal, you will receive your results only by: [place] (if you have MyChart) OR A paper copy in the mail If you have any lab test that is abnormal or we need to change your treatment, we will call you to review the results.   Testing/Procedures: None ordered   Follow-Up: At [facility], you and your health needs are our priority.  As part of our continuing mission to provide you with exceptional heart care, we have created designated Provider Care Teams.  These Care Teams include your primary Cardiologist (physician) and Advanced Practice Providers (APPs -  Physician Assistants and Nurse Practitioners) who all work together to provide you with the care you need, when you need it.  We recommend signing up for the patient portal called "MyChart".  Sign up information is provided on this After Visit Summary.  MyChart is used to connect with patients for Virtual Visits (Telemedicine).  Patients are able to view lab/test results, encounter notes, upcoming appointments, etc.  Non-urgent messages can be sent to your provider as well.   To learn more about what you can do with MyChart, go to [email or web address].    Your next appointment:   12 month(s)  The format for your next appointment:   In Person  Provider:   Sanda Klein, MD  Patient Instructions  Medication Instructions:  INCREASE the Metoprolol Succinate to 25 mg once  daily DECREASE the Diltiazem to 120 mg once daily  FUROSEMIDE: Take one 40 mg tablet daily. You may take and extra 40 mg tablet daily as needed for edema. If you take and extra Furosemide, take an extra Potassium as well.  *If you need a refill on your cardiac medications before your next appointment, please call your pharmacy*   Lab Work: None ordered If you have labs (blood work) drawn today and your tests are completely normal, you will receive your results only by: [place] [place] (if you have MyChart) OR A paper copy in the mail If you have any lab test that is abnormal or we need to change your treatment, we will call you to review the results.   Testing/Procedures: None ordered   Follow-Up: At [facility], you and your health needs are our priority.  As part of our continuing mission to provide you with exceptional heart care, we have created designated Provider Care Teams.  These Care Teams include your primary Cardiologist (physician) and Advanced Practice Providers (APPs -  Physician Assistants and Nurse Practitioners) who all work together to provide you with the care you need, when you need it.  We recommend signing up for the patient portal called "MyChart".  Sign up information is provided on this After Visit Summary.  MyChart is used to connect with patients for Virtual Visits (Telemedicine).  Patients are able to view lab/test results, encounter notes, upcoming appointments, etc.  Non-urgent messages can be sent to your provider as well.   To learn more about what you can do with MyChart, go to [email or web address].    Your next appointment:   12 month(s)  The format for your next appointment:   In Person  Provider:   Sanda Klein, MD   Signed, Sanda Klein, MD  08/10/2021 8:42 AM    [facility] [place], [place], [place]  [address] Phone: [phone]; Fax: [phone]

## 2021-08-10 NOTE — Patient Instructions (Signed)
Medication Instructions:  INCREASE the Metoprolol Succinate to 25 mg once daily DECREASE the Diltiazem to 120 mg once daily  FUROSEMIDE: Take one 40 mg tablet daily. You may take and extra 40 mg tablet daily as needed for edema. If you take and extra Furosemide, take an extra Potassium as well.  *If you need a refill on your cardiac medications before your next appointment, please call your pharmacy*   Lab Work: None ordered If you have labs (blood work) drawn today and your tests are completely normal, you will receive your results only by: [place] (if you have MyChart) OR A paper copy in the mail If you have any lab test that is abnormal or we need to change your treatment, we will call you to review the results.   Testing/Procedures: None ordered   Follow-Up: At [facility], you and your health needs are our priority.  As part of our continuing mission to provide you with exceptional heart care, we have created designated Provider Care Teams.  These Care Teams include your primary Cardiologist (physician) and Advanced Practice Providers (APPs -  Physician Assistants and Nurse Practitioners) who all work together to provide you with the care you need, when you need it.  We recommend signing up for the patient portal called "MyChart".  Sign up information is provided on this After Visit Summary.  MyChart is used to connect with patients for Virtual Visits (Telemedicine).  Patients are able to view lab/test results, encounter notes, upcoming appointments, etc.  Non-urgent messages can be sent to your provider as well.   To learn more about what you can do with MyChart, go to [email or web address].    Your next appointment:   12 month(s)  The format for your next appointment:   In Person  Provider:   Sanda Klein, MD

## 2021-08-12 DIAGNOSIS — D631 Anemia in chronic kidney disease: Secondary | ICD-10-CM | POA: Diagnosis not present

## 2021-08-12 DIAGNOSIS — E782 Mixed hyperlipidemia: Secondary | ICD-10-CM | POA: Diagnosis not present

## 2021-08-12 DIAGNOSIS — S32501D Unspecified fracture of right pubis, subsequent encounter for fracture with routine healing: Secondary | ICD-10-CM | POA: Diagnosis not present

## 2021-08-12 DIAGNOSIS — N1832 Chronic kidney disease, stage 3b: Secondary | ICD-10-CM | POA: Diagnosis not present

## 2021-08-12 DIAGNOSIS — E871 Hypo-osmolality and hyponatremia: Secondary | ICD-10-CM | POA: Diagnosis not present

## 2021-08-12 DIAGNOSIS — I129 Hypertensive chronic kidney disease with stage 1 through stage 4 chronic kidney disease, or unspecified chronic kidney disease: Secondary | ICD-10-CM | POA: Diagnosis not present

## 2021-08-13 ENCOUNTER — Other Ambulatory Visit: Payer: Self-pay

## 2021-08-13 ENCOUNTER — Ambulatory Visit (INDEPENDENT_AMBULATORY_CARE_PROVIDER_SITE_OTHER): Payer: Medicare Other | Admitting: Family Medicine

## 2021-08-13 ENCOUNTER — Encounter: Payer: Self-pay | Admitting: Family Medicine

## 2021-08-13 DIAGNOSIS — D62 Acute posthemorrhagic anemia: Secondary | ICD-10-CM

## 2021-08-13 DIAGNOSIS — E871 Hypo-osmolality and hyponatremia: Secondary | ICD-10-CM | POA: Diagnosis not present

## 2021-08-13 DIAGNOSIS — R2689 Other abnormalities of gait and mobility: Secondary | ICD-10-CM | POA: Diagnosis not present

## 2021-08-13 DIAGNOSIS — I10 Essential (primary) hypertension: Secondary | ICD-10-CM | POA: Diagnosis not present

## 2021-08-13 DIAGNOSIS — S32609S Unspecified fracture of unspecified ischium, sequela: Secondary | ICD-10-CM

## 2021-08-13 DIAGNOSIS — D509 Iron deficiency anemia, unspecified: Secondary | ICD-10-CM | POA: Diagnosis not present

## 2021-08-13 DIAGNOSIS — M79604 Pain in right leg: Secondary | ICD-10-CM | POA: Diagnosis not present

## 2021-08-13 DIAGNOSIS — M79605 Pain in left leg: Secondary | ICD-10-CM

## 2021-08-13 DIAGNOSIS — E876 Hypokalemia: Secondary | ICD-10-CM | POA: Diagnosis not present

## 2021-08-13 DIAGNOSIS — D631 Anemia in chronic kidney disease: Secondary | ICD-10-CM | POA: Diagnosis not present

## 2021-08-13 DIAGNOSIS — S32501D Unspecified fracture of right pubis, subsequent encounter for fracture with routine healing: Secondary | ICD-10-CM | POA: Diagnosis not present

## 2021-08-13 DIAGNOSIS — N1832 Chronic kidney disease, stage 3b: Secondary | ICD-10-CM | POA: Diagnosis not present

## 2021-08-13 DIAGNOSIS — I129 Hypertensive chronic kidney disease with stage 1 through stage 4 chronic kidney disease, or unspecified chronic kidney disease: Secondary | ICD-10-CM | POA: Diagnosis not present

## 2021-08-13 DIAGNOSIS — E782 Mixed hyperlipidemia: Secondary | ICD-10-CM | POA: Diagnosis not present

## 2021-08-13 MED ORDER — METHOCARBAMOL 500 MG PO TABS: 500.0000 mg | ORAL_TABLET | Freq: Three times a day (TID) | ORAL | 0 refills | Status: DC | PRN

## 2021-08-13 NOTE — Progress Notes (Signed)
Virtual telephone visit    Virtual Visit via Telephone Note   This visit type was conducted due to national recommendations for restrictions regarding the COVID-19 Pandemic (e.g. social distancing) in an effort to limit this patient's exposure and mitigate transmission in our community. Due to her co-morbid illnesses, this patient is at least at moderate risk for complications without adequate follow up. This format is felt to be most appropriate for this patient at this time. The patient did not have access to video technology or had technical difficulties with video requiring transitioning to audio format only (telephone). Physical exam was limited to content and character of the telephone converstion. WE was able to get the patient set up on a telephone visit.   Patient location: home Patient and provider in visit Provider location: Office  I discussed the limitations of evaluation and management by telemedicine and the availability of in person appointments. The patient expressed understanding and agreed to proceed.   Visit Date: 08/13/2021  Today's healthcare provider: Wellington Hampshire, MD     Subjective:    Patient ID: [name], female    DOB: 1932/01/24, 88 y.o.   MRN: [id number]  Chief Complaint  Patient presents with   Restless leg    Started since happen getting out of the hospital after broken pelvis, starts in hips and goes down legs Requesting medication     HPI Patient is in today for wants meds for "restless legs"  On 06/30/21-was in [place] office for shot in back.  Thinks fx pelvis getting onto table for injection.  No Fall. Hosp for fx pelvis. Hosp 07/13/21-07/17/21.  Rehab 1/27-2/4. Saw PCP on 2/8 for f/u.  Getting home PT  Pain for past several days in hips and rad down legs to feet.  Occurs anytime day or night and any position.  Gets started and "doesn't let up". Feels "terrible".  And feels like wants to move leg and jerking but doesn't happen. Takes  tylenol-helps some but wears off.  Wants requip.  Feels trembly and "aweful" since hosp.  No n/t.  Just hips and down both legs.  Takes lorazepam-rarely Sees Dr. Arlington Calix) on Monday.  Past Medical History:  Diagnosis Date   Allergic rhinitis    Anginal pain (HCC)    Asthmatic bronchitis    Chronic cough    Dysrhythmia    PAF   Esophageal reflux    GERD (gastroesophageal reflux disease)    History of stress test 11/20/2009   This was essentially normal,post-stress EF was hyperdynamic at 73 beats per mins, There was no scar or ischemia.   Hx of echocardiogram 11/05/2010   EF>55% showed a normal systolic function with impaired diastolic dysfuntion and also tissue doppler suggesting increased elevated left atrial pressure, she had moderate LA dilatation and mild LA dilatation. There was calcified mitral apparatus with moderate MR, mild to moderate TR and trace aortic insufficiency.   Hyperlipemia    Hypertension    Hypothyroidism    PAF (paroxysmal atrial fibrillation) ([place])    Pneumonia     Past Surgical History:  Procedure Laterality Date   CHOLECYSTECTOMY     CYSTOSCOPY WITH BIOPSY N/A 08/29/2020   Procedure: CYSTOSCOPY WITH BIOPSY WITH FULGURATION;  Surgeon: Irine Seal, MD;  Location: [facility];  Service: Urology;  Laterality: N/A;   FOOT SURGERY     right   NASAL SEPTUM SURGERY     PACEMAKER INSERTION  June 1st 2009   [facility] Jude [place] device, model #  [id number], serial [id number], The artial lead is a Interior and spatial designer, the ventricular lead is Environmental education officer.   PPM GENERATOR CHANGEOUT N/A 02/09/2017   Procedure: PPM GENERATOR CHANGEOUT;  Surgeon: Sanda Klein, MD;  Location: [facility];  Service: Cardiovascular;  Laterality: N/A;   THYROID SURGERY     TONSILLECTOMY     TONSILLECTOMY     VESICOVAGINAL FISTULA CLOSURE W/ TAH      Family History  Problem Relation Age of Onset   Stroke Mother    Cancer Father        bladder   Stroke Sister     Social History    Socioeconomic History   Marital status: Widowed    Spouse name: Not on file   Number of children: Not on file   Years of education: Not on file   Highest education level: Not on file  Occupational History    Employer: RETIRED    Comment: Retired Sales promotion account executive at [employer]  Tobacco Use   Smoking status: Never   Smokeless tobacco: Never  Vaping Use   Vaping Use: Never used  Substance and Sexual Activity   Alcohol use: No   Drug use: No   Sexual activity: Not on file  Other Topics Concern   Not on file  Social History Narrative   Not on file   Social Determinants of Health   Financial Resource Strain: Not on file  Food Insecurity: Not on file  Transportation Needs: Not on file  Physical Activity: Not on file  Stress: Not on file  Social Connections: Not on file  Intimate Partner Violence: Not on file    Outpatient Medications Prior to Visit  Medication Sig Dispense Refill   acetaminophen (TYLENOL) 325 MG tablet Take 2 tablets (650 mg total) by mouth 3 (three) times daily.     Ascorbic Acid (VITAMIN C) 100 MG tablet 500 mg daily.     aspirin 81 MG chewable tablet Chew 81 mg by mouth at bedtime.     cholecalciferol (VITAMIN D3) 25 MCG (1000 UNIT) tablet Take 1 tablet (1,000 Units total) by mouth daily. 30 tablet 0   diltiazem (CARDIZEM CD) 120 MG 24 hr capsule Take 1 capsule (120 mg total) by mouth at bedtime. 90 capsule 3   docusate sodium (COLACE) 100 MG capsule Take 1 capsule (100 mg total) by mouth 2 (two) times daily. 10 capsule 0   furosemide (LASIX) 40 MG tablet Take 1 tablet (40 mg total) by mouth daily. May take one extra tablet daily as needed for edema. 35 tablet 11   LORazepam (ATIVAN) 0.5 MG tablet Take 1 tablet (0.5 mg total) by mouth daily as needed for anxiety. 10 tablet 0   metoprolol succinate (TOPROL-XL) 25 MG 24 hr tablet Take 1 tablet (25 mg total) by mouth at bedtime. 90 tablet 3   Multiple Vitamin (MULTIVITAMIN WITH MINERALS) TABS tablet  Take 1 tablet by mouth daily. 90 tablet 0   pantoprazole (PROTONIX) 40 MG tablet Take 2 tablets (80 mg total) by mouth daily. 60 tablet 0   potassium chloride SA (KLOR-CON M20) 20 MEQ tablet Take 1 tablet (20 mEq total) by mouth daily. Take an extra tablet once daily when you take the extra furosemide 35 tablet 11   pravastatin (PRAVACHOL) 40 MG tablet Take 1 tablet (40 mg total) by mouth at bedtime. 30 tablet 0   valsartan-hydrochlorothiazide (DIOVAN-HCT) 160-25 MG tablet Take 1 tablet by mouth daily.     calcium  carbonate (TUMS EX) 750 MG chewable tablet Chew 1 tablet by mouth daily as needed for heartburn. (Patient not taking: Reported on 08/13/2021)     cetirizine (ZYRTEC) 10 MG tablet 1 tablet (Patient not taking: Reported on 08/10/2021)     No facility-administered medications prior to visit.    Allergies  Allergen Reactions   Fluticasone-Salmeterol Other (See Comments)    unknown Other reaction(s): tongue/throat symptoms   Sulfa Antibiotics     Other reaction(s): gastric intol   Sulfonamide Derivatives Other (See Comments)    unknown   Benzonatate Other (See Comments)    tongue/throat symptoms in the past but tolerated more recently Other reaction(s): and tongue/throat symptoms in the past (not more recently)   Erythromycin Nausea And Vomiting and Other (See Comments)    Stomach cramps Other reaction(s): gastric intol   Meloxicam Other (See Comments)    Mild dyspnea Other reaction(s): dyspnea   Sulfamethoxazole Other (See Comments)    Gastric intolerance    ROS     Objective:    Physical Exam  There were no vitals taken for this visit. Wt Readings from Last 3 Encounters:  08/10/21 145 lb (65.8 kg)  07/17/21 159 lb 13.3 oz (72.5 kg)  07/14/21 154 lb 5.2 oz (70 kg)       Spent 40min reviewing chart from Jan to date, labs.  Other 15-20 min getting hx, devising plan w/pt. Assessment & Plan:   Problem List Items Addressed This Visit       Musculoskeletal and  Integument   Pelvic fracture ([place])     Other   Hypokalemia   Other Visit Diagnoses     Leg pain, bilateral    -  Primary   Iron deficiency anemia, unspecified iron deficiency anemia type          Pelvic fx-getting home PT.  Has f/u next wk w/ortho Leg pain B-per pt "restless legs".  May be residual Chronic lumbar radiculopathy(was getting injections), vs from pelvic fx vs anemia(seen on labs in hosp).  Continue tylenol.  Add methocarbamol.  May need requip, but need to r/o secondary causes.  Check iron, B12, cmp, etc.  And d/t ortho as well Hypokalemia-meds, etc.  Need to check labs as on Lasix and c/o "restless legs"  I am having Raynelle Jan maintain her calcium carbonate, vitamin C, aspirin, cetirizine, multivitamin with minerals, cholecalciferol, acetaminophen, pantoprazole, pravastatin, docusate sodium, LORazepam, valsartan-hydrochlorothiazide, diltiazem, furosemide, potassium chloride SA, and metoprolol succinate.  No orders of the defined types were placed in this encounter.    I discussed the assessment and treatment plan with the patient. The patient was provided an opportunity to ask questions and all were answered. The patient agreed with the plan and demonstrated an understanding of the instructions.   The patient was advised to call back or seek an in-person evaluation if the symptoms worsen or if the condition fails to improve as anticipated.  I provided 30 minutes of non-face-to-face time during this encounter.   Wellington Hampshire, MD [place] [phone] (phone) [phone] (fax)  [place]

## 2021-08-17 DIAGNOSIS — M7062 Trochanteric bursitis, left hip: Secondary | ICD-10-CM | POA: Diagnosis not present

## 2021-08-17 DIAGNOSIS — S329XXA Fracture of unspecified parts of lumbosacral spine and pelvis, initial encounter for closed fracture: Secondary | ICD-10-CM | POA: Diagnosis not present

## 2021-08-17 DIAGNOSIS — M25552 Pain in left hip: Secondary | ICD-10-CM | POA: Diagnosis not present

## 2021-08-20 DIAGNOSIS — D631 Anemia in chronic kidney disease: Secondary | ICD-10-CM | POA: Diagnosis not present

## 2021-08-20 DIAGNOSIS — E871 Hypo-osmolality and hyponatremia: Secondary | ICD-10-CM | POA: Diagnosis not present

## 2021-08-20 DIAGNOSIS — N1832 Chronic kidney disease, stage 3b: Secondary | ICD-10-CM | POA: Diagnosis not present

## 2021-08-20 DIAGNOSIS — S32501D Unspecified fracture of right pubis, subsequent encounter for fracture with routine healing: Secondary | ICD-10-CM | POA: Diagnosis not present

## 2021-08-20 DIAGNOSIS — E782 Mixed hyperlipidemia: Secondary | ICD-10-CM | POA: Diagnosis not present

## 2021-08-20 DIAGNOSIS — I129 Hypertensive chronic kidney disease with stage 1 through stage 4 chronic kidney disease, or unspecified chronic kidney disease: Secondary | ICD-10-CM | POA: Diagnosis not present

## 2021-08-24 DIAGNOSIS — N1832 Chronic kidney disease, stage 3b: Secondary | ICD-10-CM | POA: Diagnosis not present

## 2021-08-24 DIAGNOSIS — E871 Hypo-osmolality and hyponatremia: Secondary | ICD-10-CM | POA: Diagnosis not present

## 2021-08-24 DIAGNOSIS — E782 Mixed hyperlipidemia: Secondary | ICD-10-CM | POA: Diagnosis not present

## 2021-08-24 DIAGNOSIS — S32501D Unspecified fracture of right pubis, subsequent encounter for fracture with routine healing: Secondary | ICD-10-CM | POA: Diagnosis not present

## 2021-08-24 DIAGNOSIS — D631 Anemia in chronic kidney disease: Secondary | ICD-10-CM | POA: Diagnosis not present

## 2021-08-24 DIAGNOSIS — I129 Hypertensive chronic kidney disease with stage 1 through stage 4 chronic kidney disease, or unspecified chronic kidney disease: Secondary | ICD-10-CM | POA: Diagnosis not present

## 2021-08-26 DIAGNOSIS — D631 Anemia in chronic kidney disease: Secondary | ICD-10-CM | POA: Diagnosis not present

## 2021-08-26 DIAGNOSIS — E782 Mixed hyperlipidemia: Secondary | ICD-10-CM | POA: Diagnosis not present

## 2021-08-26 DIAGNOSIS — I129 Hypertensive chronic kidney disease with stage 1 through stage 4 chronic kidney disease, or unspecified chronic kidney disease: Secondary | ICD-10-CM | POA: Diagnosis not present

## 2021-08-26 DIAGNOSIS — S32501D Unspecified fracture of right pubis, subsequent encounter for fracture with routine healing: Secondary | ICD-10-CM | POA: Diagnosis not present

## 2021-08-26 DIAGNOSIS — N1832 Chronic kidney disease, stage 3b: Secondary | ICD-10-CM | POA: Diagnosis not present

## 2021-08-26 DIAGNOSIS — E871 Hypo-osmolality and hyponatremia: Secondary | ICD-10-CM | POA: Diagnosis not present

## 2021-08-27 DIAGNOSIS — G8929 Other chronic pain: Secondary | ICD-10-CM | POA: Diagnosis not present

## 2021-08-27 DIAGNOSIS — E871 Hypo-osmolality and hyponatremia: Secondary | ICD-10-CM | POA: Diagnosis not present

## 2021-08-27 DIAGNOSIS — E782 Mixed hyperlipidemia: Secondary | ICD-10-CM | POA: Diagnosis not present

## 2021-08-27 DIAGNOSIS — F419 Anxiety disorder, unspecified: Secondary | ICD-10-CM | POA: Diagnosis not present

## 2021-08-27 DIAGNOSIS — I129 Hypertensive chronic kidney disease with stage 1 through stage 4 chronic kidney disease, or unspecified chronic kidney disease: Secondary | ICD-10-CM | POA: Diagnosis not present

## 2021-08-27 DIAGNOSIS — M549 Dorsalgia, unspecified: Secondary | ICD-10-CM | POA: Diagnosis not present

## 2021-08-27 DIAGNOSIS — F39 Unspecified mood [affective] disorder: Secondary | ICD-10-CM | POA: Diagnosis not present

## 2021-08-27 DIAGNOSIS — I495 Sick sinus syndrome: Secondary | ICD-10-CM | POA: Diagnosis not present

## 2021-08-27 DIAGNOSIS — N1832 Chronic kidney disease, stage 3b: Secondary | ICD-10-CM | POA: Diagnosis not present

## 2021-08-27 DIAGNOSIS — Z8701 Personal history of pneumonia (recurrent): Secondary | ICD-10-CM | POA: Diagnosis not present

## 2021-08-27 DIAGNOSIS — K219 Gastro-esophageal reflux disease without esophagitis: Secondary | ICD-10-CM | POA: Diagnosis not present

## 2021-08-27 DIAGNOSIS — E876 Hypokalemia: Secondary | ICD-10-CM | POA: Diagnosis not present

## 2021-08-27 DIAGNOSIS — I480 Paroxysmal atrial fibrillation: Secondary | ICD-10-CM | POA: Diagnosis not present

## 2021-08-27 DIAGNOSIS — S32501D Unspecified fracture of right pubis, subsequent encounter for fracture with routine healing: Secondary | ICD-10-CM | POA: Diagnosis not present

## 2021-08-27 DIAGNOSIS — J309 Allergic rhinitis, unspecified: Secondary | ICD-10-CM | POA: Diagnosis not present

## 2021-08-27 DIAGNOSIS — Z7901 Long term (current) use of anticoagulants: Secondary | ICD-10-CM | POA: Diagnosis not present

## 2021-08-27 DIAGNOSIS — N179 Acute kidney failure, unspecified: Secondary | ICD-10-CM | POA: Diagnosis not present

## 2021-08-27 DIAGNOSIS — Z9049 Acquired absence of other specified parts of digestive tract: Secondary | ICD-10-CM | POA: Diagnosis not present

## 2021-08-27 DIAGNOSIS — Z7982 Long term (current) use of aspirin: Secondary | ICD-10-CM | POA: Diagnosis not present

## 2021-08-27 DIAGNOSIS — I442 Atrioventricular block, complete: Secondary | ICD-10-CM | POA: Diagnosis not present

## 2021-08-27 DIAGNOSIS — Z950 Presence of cardiac pacemaker: Secondary | ICD-10-CM | POA: Diagnosis not present

## 2021-08-27 DIAGNOSIS — D631 Anemia in chronic kidney disease: Secondary | ICD-10-CM | POA: Diagnosis not present

## 2021-08-31 DIAGNOSIS — E782 Mixed hyperlipidemia: Secondary | ICD-10-CM | POA: Diagnosis not present

## 2021-08-31 DIAGNOSIS — N1832 Chronic kidney disease, stage 3b: Secondary | ICD-10-CM | POA: Diagnosis not present

## 2021-08-31 DIAGNOSIS — D631 Anemia in chronic kidney disease: Secondary | ICD-10-CM | POA: Diagnosis not present

## 2021-08-31 DIAGNOSIS — S32501D Unspecified fracture of right pubis, subsequent encounter for fracture with routine healing: Secondary | ICD-10-CM | POA: Diagnosis not present

## 2021-08-31 DIAGNOSIS — I129 Hypertensive chronic kidney disease with stage 1 through stage 4 chronic kidney disease, or unspecified chronic kidney disease: Secondary | ICD-10-CM | POA: Diagnosis not present

## 2021-08-31 DIAGNOSIS — E871 Hypo-osmolality and hyponatremia: Secondary | ICD-10-CM | POA: Diagnosis not present

## 2021-09-04 DIAGNOSIS — E871 Hypo-osmolality and hyponatremia: Secondary | ICD-10-CM | POA: Diagnosis not present

## 2021-09-04 DIAGNOSIS — E782 Mixed hyperlipidemia: Secondary | ICD-10-CM | POA: Diagnosis not present

## 2021-09-04 DIAGNOSIS — I129 Hypertensive chronic kidney disease with stage 1 through stage 4 chronic kidney disease, or unspecified chronic kidney disease: Secondary | ICD-10-CM | POA: Diagnosis not present

## 2021-09-04 DIAGNOSIS — D631 Anemia in chronic kidney disease: Secondary | ICD-10-CM | POA: Diagnosis not present

## 2021-09-04 DIAGNOSIS — S32501D Unspecified fracture of right pubis, subsequent encounter for fracture with routine healing: Secondary | ICD-10-CM | POA: Diagnosis not present

## 2021-09-04 DIAGNOSIS — N1832 Chronic kidney disease, stage 3b: Secondary | ICD-10-CM | POA: Diagnosis not present

## 2021-09-08 DIAGNOSIS — N1832 Chronic kidney disease, stage 3b: Secondary | ICD-10-CM | POA: Diagnosis not present

## 2021-09-08 DIAGNOSIS — E782 Mixed hyperlipidemia: Secondary | ICD-10-CM | POA: Diagnosis not present

## 2021-09-08 DIAGNOSIS — E871 Hypo-osmolality and hyponatremia: Secondary | ICD-10-CM | POA: Diagnosis not present

## 2021-09-08 DIAGNOSIS — D631 Anemia in chronic kidney disease: Secondary | ICD-10-CM | POA: Diagnosis not present

## 2021-09-08 DIAGNOSIS — I129 Hypertensive chronic kidney disease with stage 1 through stage 4 chronic kidney disease, or unspecified chronic kidney disease: Secondary | ICD-10-CM | POA: Diagnosis not present

## 2021-09-08 DIAGNOSIS — S32501D Unspecified fracture of right pubis, subsequent encounter for fracture with routine healing: Secondary | ICD-10-CM | POA: Diagnosis not present

## 2021-09-09 DIAGNOSIS — D631 Anemia in chronic kidney disease: Secondary | ICD-10-CM | POA: Diagnosis not present

## 2021-09-09 DIAGNOSIS — I129 Hypertensive chronic kidney disease with stage 1 through stage 4 chronic kidney disease, or unspecified chronic kidney disease: Secondary | ICD-10-CM | POA: Diagnosis not present

## 2021-09-09 DIAGNOSIS — S32501D Unspecified fracture of right pubis, subsequent encounter for fracture with routine healing: Secondary | ICD-10-CM | POA: Diagnosis not present

## 2021-09-09 DIAGNOSIS — N1832 Chronic kidney disease, stage 3b: Secondary | ICD-10-CM | POA: Diagnosis not present

## 2021-09-09 DIAGNOSIS — E871 Hypo-osmolality and hyponatremia: Secondary | ICD-10-CM | POA: Diagnosis not present

## 2021-09-09 DIAGNOSIS — E782 Mixed hyperlipidemia: Secondary | ICD-10-CM | POA: Diagnosis not present

## 2021-09-11 ENCOUNTER — Telehealth: Payer: Self-pay | Admitting: Family Medicine

## 2021-09-11 NOTE — Telephone Encounter (Signed)
Tish with [facility] called to say she is  needing verbal orders for this pt. Please call back [phone] ?

## 2021-09-11 NOTE — Telephone Encounter (Signed)
LVM for Tish at [facility] to return my call regarding verbal ordes for pt ? ?

## 2021-09-13 ENCOUNTER — Other Ambulatory Visit: Payer: Self-pay | Admitting: Cardiovascular Disease

## 2021-09-15 DIAGNOSIS — D631 Anemia in chronic kidney disease: Secondary | ICD-10-CM | POA: Diagnosis not present

## 2021-09-15 DIAGNOSIS — N1832 Chronic kidney disease, stage 3b: Secondary | ICD-10-CM | POA: Diagnosis not present

## 2021-09-15 DIAGNOSIS — S32501D Unspecified fracture of right pubis, subsequent encounter for fracture with routine healing: Secondary | ICD-10-CM | POA: Diagnosis not present

## 2021-09-15 DIAGNOSIS — E782 Mixed hyperlipidemia: Secondary | ICD-10-CM | POA: Diagnosis not present

## 2021-09-15 DIAGNOSIS — I129 Hypertensive chronic kidney disease with stage 1 through stage 4 chronic kidney disease, or unspecified chronic kidney disease: Secondary | ICD-10-CM | POA: Diagnosis not present

## 2021-09-15 DIAGNOSIS — E871 Hypo-osmolality and hyponatremia: Secondary | ICD-10-CM | POA: Diagnosis not present

## 2021-09-16 ENCOUNTER — Telehealth: Payer: Self-pay

## 2021-09-16 NOTE — Telephone Encounter (Signed)
I spoke with the Tish from [facility] to give verbal orders for continued Physical Therapy once a week for the next 2 weeks.  ?

## 2021-09-18 DIAGNOSIS — N1832 Chronic kidney disease, stage 3b: Secondary | ICD-10-CM | POA: Diagnosis not present

## 2021-09-18 DIAGNOSIS — E871 Hypo-osmolality and hyponatremia: Secondary | ICD-10-CM | POA: Diagnosis not present

## 2021-09-18 DIAGNOSIS — I129 Hypertensive chronic kidney disease with stage 1 through stage 4 chronic kidney disease, or unspecified chronic kidney disease: Secondary | ICD-10-CM | POA: Diagnosis not present

## 2021-09-18 DIAGNOSIS — D631 Anemia in chronic kidney disease: Secondary | ICD-10-CM | POA: Diagnosis not present

## 2021-09-18 DIAGNOSIS — E782 Mixed hyperlipidemia: Secondary | ICD-10-CM | POA: Diagnosis not present

## 2021-09-18 DIAGNOSIS — S32501D Unspecified fracture of right pubis, subsequent encounter for fracture with routine healing: Secondary | ICD-10-CM | POA: Diagnosis not present

## 2021-09-21 DIAGNOSIS — I129 Hypertensive chronic kidney disease with stage 1 through stage 4 chronic kidney disease, or unspecified chronic kidney disease: Secondary | ICD-10-CM | POA: Diagnosis not present

## 2021-09-21 DIAGNOSIS — E782 Mixed hyperlipidemia: Secondary | ICD-10-CM | POA: Diagnosis not present

## 2021-09-21 DIAGNOSIS — D631 Anemia in chronic kidney disease: Secondary | ICD-10-CM | POA: Diagnosis not present

## 2021-09-21 DIAGNOSIS — N1832 Chronic kidney disease, stage 3b: Secondary | ICD-10-CM | POA: Diagnosis not present

## 2021-09-21 DIAGNOSIS — S32501D Unspecified fracture of right pubis, subsequent encounter for fracture with routine healing: Secondary | ICD-10-CM | POA: Diagnosis not present

## 2021-09-21 DIAGNOSIS — E871 Hypo-osmolality and hyponatremia: Secondary | ICD-10-CM | POA: Diagnosis not present

## 2021-09-25 DIAGNOSIS — E782 Mixed hyperlipidemia: Secondary | ICD-10-CM | POA: Diagnosis not present

## 2021-09-25 DIAGNOSIS — N1832 Chronic kidney disease, stage 3b: Secondary | ICD-10-CM | POA: Diagnosis not present

## 2021-09-25 DIAGNOSIS — E871 Hypo-osmolality and hyponatremia: Secondary | ICD-10-CM | POA: Diagnosis not present

## 2021-09-25 DIAGNOSIS — I129 Hypertensive chronic kidney disease with stage 1 through stage 4 chronic kidney disease, or unspecified chronic kidney disease: Secondary | ICD-10-CM | POA: Diagnosis not present

## 2021-09-25 DIAGNOSIS — S32501D Unspecified fracture of right pubis, subsequent encounter for fracture with routine healing: Secondary | ICD-10-CM | POA: Diagnosis not present

## 2021-09-25 DIAGNOSIS — D631 Anemia in chronic kidney disease: Secondary | ICD-10-CM | POA: Diagnosis not present

## 2021-09-26 DIAGNOSIS — M549 Dorsalgia, unspecified: Secondary | ICD-10-CM | POA: Diagnosis not present

## 2021-09-26 DIAGNOSIS — E876 Hypokalemia: Secondary | ICD-10-CM | POA: Diagnosis not present

## 2021-09-26 DIAGNOSIS — I442 Atrioventricular block, complete: Secondary | ICD-10-CM | POA: Diagnosis not present

## 2021-09-26 DIAGNOSIS — D631 Anemia in chronic kidney disease: Secondary | ICD-10-CM | POA: Diagnosis not present

## 2021-09-26 DIAGNOSIS — I129 Hypertensive chronic kidney disease with stage 1 through stage 4 chronic kidney disease, or unspecified chronic kidney disease: Secondary | ICD-10-CM | POA: Diagnosis not present

## 2021-09-26 DIAGNOSIS — I480 Paroxysmal atrial fibrillation: Secondary | ICD-10-CM | POA: Diagnosis not present

## 2021-09-26 DIAGNOSIS — J309 Allergic rhinitis, unspecified: Secondary | ICD-10-CM | POA: Diagnosis not present

## 2021-09-26 DIAGNOSIS — Z9049 Acquired absence of other specified parts of digestive tract: Secondary | ICD-10-CM | POA: Diagnosis not present

## 2021-09-26 DIAGNOSIS — F39 Unspecified mood [affective] disorder: Secondary | ICD-10-CM | POA: Diagnosis not present

## 2021-09-26 DIAGNOSIS — G8929 Other chronic pain: Secondary | ICD-10-CM | POA: Diagnosis not present

## 2021-09-26 DIAGNOSIS — Z8701 Personal history of pneumonia (recurrent): Secondary | ICD-10-CM | POA: Diagnosis not present

## 2021-09-26 DIAGNOSIS — E782 Mixed hyperlipidemia: Secondary | ICD-10-CM | POA: Diagnosis not present

## 2021-09-26 DIAGNOSIS — S32501D Unspecified fracture of right pubis, subsequent encounter for fracture with routine healing: Secondary | ICD-10-CM | POA: Diagnosis not present

## 2021-09-26 DIAGNOSIS — Z7982 Long term (current) use of aspirin: Secondary | ICD-10-CM | POA: Diagnosis not present

## 2021-09-26 DIAGNOSIS — Z7901 Long term (current) use of anticoagulants: Secondary | ICD-10-CM | POA: Diagnosis not present

## 2021-09-26 DIAGNOSIS — Z950 Presence of cardiac pacemaker: Secondary | ICD-10-CM | POA: Diagnosis not present

## 2021-09-26 DIAGNOSIS — K219 Gastro-esophageal reflux disease without esophagitis: Secondary | ICD-10-CM | POA: Diagnosis not present

## 2021-09-26 DIAGNOSIS — N1832 Chronic kidney disease, stage 3b: Secondary | ICD-10-CM | POA: Diagnosis not present

## 2021-09-26 DIAGNOSIS — F419 Anxiety disorder, unspecified: Secondary | ICD-10-CM | POA: Diagnosis not present

## 2021-09-26 DIAGNOSIS — I495 Sick sinus syndrome: Secondary | ICD-10-CM | POA: Diagnosis not present

## 2021-09-26 DIAGNOSIS — E871 Hypo-osmolality and hyponatremia: Secondary | ICD-10-CM | POA: Diagnosis not present

## 2021-09-28 ENCOUNTER — Telehealth: Payer: Self-pay | Admitting: Family Medicine

## 2021-09-28 NOTE — Telephone Encounter (Signed)
Please advise 

## 2021-09-28 NOTE — Telephone Encounter (Signed)
Ok with me. Please place any necessary orders. 

## 2021-09-28 NOTE — Telephone Encounter (Signed)
[name] (OT) with [facility] is needing VO for this patient for occupational therapy. He is requesting 1 a week for 8 weeks starting today. Please call [phone]. ?

## 2021-09-28 NOTE — Telephone Encounter (Signed)
Call [phone] VO Given  ?Once per week x 8 weeks  ?

## 2021-09-29 DIAGNOSIS — E871 Hypo-osmolality and hyponatremia: Secondary | ICD-10-CM | POA: Diagnosis not present

## 2021-09-29 DIAGNOSIS — I129 Hypertensive chronic kidney disease with stage 1 through stage 4 chronic kidney disease, or unspecified chronic kidney disease: Secondary | ICD-10-CM | POA: Diagnosis not present

## 2021-09-29 DIAGNOSIS — E782 Mixed hyperlipidemia: Secondary | ICD-10-CM | POA: Diagnosis not present

## 2021-09-29 DIAGNOSIS — D631 Anemia in chronic kidney disease: Secondary | ICD-10-CM | POA: Diagnosis not present

## 2021-09-29 DIAGNOSIS — S32501D Unspecified fracture of right pubis, subsequent encounter for fracture with routine healing: Secondary | ICD-10-CM | POA: Diagnosis not present

## 2021-09-29 DIAGNOSIS — N1832 Chronic kidney disease, stage 3b: Secondary | ICD-10-CM | POA: Diagnosis not present

## 2021-10-01 DIAGNOSIS — E871 Hypo-osmolality and hyponatremia: Secondary | ICD-10-CM | POA: Diagnosis not present

## 2021-10-01 DIAGNOSIS — I129 Hypertensive chronic kidney disease with stage 1 through stage 4 chronic kidney disease, or unspecified chronic kidney disease: Secondary | ICD-10-CM | POA: Diagnosis not present

## 2021-10-01 DIAGNOSIS — N1832 Chronic kidney disease, stage 3b: Secondary | ICD-10-CM | POA: Diagnosis not present

## 2021-10-01 DIAGNOSIS — D631 Anemia in chronic kidney disease: Secondary | ICD-10-CM | POA: Diagnosis not present

## 2021-10-01 DIAGNOSIS — E782 Mixed hyperlipidemia: Secondary | ICD-10-CM | POA: Diagnosis not present

## 2021-10-01 DIAGNOSIS — S32501D Unspecified fracture of right pubis, subsequent encounter for fracture with routine healing: Secondary | ICD-10-CM | POA: Diagnosis not present

## 2021-10-02 DIAGNOSIS — E782 Mixed hyperlipidemia: Secondary | ICD-10-CM | POA: Diagnosis not present

## 2021-10-02 DIAGNOSIS — I129 Hypertensive chronic kidney disease with stage 1 through stage 4 chronic kidney disease, or unspecified chronic kidney disease: Secondary | ICD-10-CM | POA: Diagnosis not present

## 2021-10-02 DIAGNOSIS — D631 Anemia in chronic kidney disease: Secondary | ICD-10-CM | POA: Diagnosis not present

## 2021-10-02 DIAGNOSIS — E871 Hypo-osmolality and hyponatremia: Secondary | ICD-10-CM | POA: Diagnosis not present

## 2021-10-02 DIAGNOSIS — N1832 Chronic kidney disease, stage 3b: Secondary | ICD-10-CM | POA: Diagnosis not present

## 2021-10-02 DIAGNOSIS — S32501D Unspecified fracture of right pubis, subsequent encounter for fracture with routine healing: Secondary | ICD-10-CM | POA: Diagnosis not present

## 2021-10-05 DIAGNOSIS — S32501D Unspecified fracture of right pubis, subsequent encounter for fracture with routine healing: Secondary | ICD-10-CM | POA: Diagnosis not present

## 2021-10-05 DIAGNOSIS — N1832 Chronic kidney disease, stage 3b: Secondary | ICD-10-CM | POA: Diagnosis not present

## 2021-10-05 DIAGNOSIS — E782 Mixed hyperlipidemia: Secondary | ICD-10-CM | POA: Diagnosis not present

## 2021-10-05 DIAGNOSIS — E871 Hypo-osmolality and hyponatremia: Secondary | ICD-10-CM | POA: Diagnosis not present

## 2021-10-05 DIAGNOSIS — I129 Hypertensive chronic kidney disease with stage 1 through stage 4 chronic kidney disease, or unspecified chronic kidney disease: Secondary | ICD-10-CM | POA: Diagnosis not present

## 2021-10-05 DIAGNOSIS — D631 Anemia in chronic kidney disease: Secondary | ICD-10-CM | POA: Diagnosis not present

## 2021-10-06 DIAGNOSIS — N1832 Chronic kidney disease, stage 3b: Secondary | ICD-10-CM | POA: Diagnosis not present

## 2021-10-06 DIAGNOSIS — I129 Hypertensive chronic kidney disease with stage 1 through stage 4 chronic kidney disease, or unspecified chronic kidney disease: Secondary | ICD-10-CM | POA: Diagnosis not present

## 2021-10-06 DIAGNOSIS — E782 Mixed hyperlipidemia: Secondary | ICD-10-CM | POA: Diagnosis not present

## 2021-10-06 DIAGNOSIS — E871 Hypo-osmolality and hyponatremia: Secondary | ICD-10-CM | POA: Diagnosis not present

## 2021-10-06 DIAGNOSIS — S32501D Unspecified fracture of right pubis, subsequent encounter for fracture with routine healing: Secondary | ICD-10-CM | POA: Diagnosis not present

## 2021-10-06 DIAGNOSIS — D631 Anemia in chronic kidney disease: Secondary | ICD-10-CM | POA: Diagnosis not present

## 2021-10-08 ENCOUNTER — Telehealth: Payer: Self-pay | Admitting: Family Medicine

## 2021-10-08 DIAGNOSIS — I129 Hypertensive chronic kidney disease with stage 1 through stage 4 chronic kidney disease, or unspecified chronic kidney disease: Secondary | ICD-10-CM | POA: Diagnosis not present

## 2021-10-08 DIAGNOSIS — D631 Anemia in chronic kidney disease: Secondary | ICD-10-CM | POA: Diagnosis not present

## 2021-10-08 DIAGNOSIS — N1832 Chronic kidney disease, stage 3b: Secondary | ICD-10-CM | POA: Diagnosis not present

## 2021-10-08 DIAGNOSIS — E871 Hypo-osmolality and hyponatremia: Secondary | ICD-10-CM | POA: Diagnosis not present

## 2021-10-08 DIAGNOSIS — S32501D Unspecified fracture of right pubis, subsequent encounter for fracture with routine healing: Secondary | ICD-10-CM | POA: Diagnosis not present

## 2021-10-08 DIAGNOSIS — E782 Mixed hyperlipidemia: Secondary | ICD-10-CM | POA: Diagnosis not present

## 2021-10-08 NOTE — Telephone Encounter (Signed)
[name]- home aid for patient called to report a fall- patient fell on 4/18 at night - patient slipt of chair- patient has no injury- patient only has a minor bruise around rib - [name] stated she had to report this fall- no further action needed per aid -  ?

## 2021-10-12 ENCOUNTER — Encounter
Payer: Medicare Other | Attending: Physical Medicine and Rehabilitation | Admitting: Physical Medicine and Rehabilitation

## 2021-10-12 DIAGNOSIS — I129 Hypertensive chronic kidney disease with stage 1 through stage 4 chronic kidney disease, or unspecified chronic kidney disease: Secondary | ICD-10-CM | POA: Diagnosis not present

## 2021-10-12 DIAGNOSIS — D631 Anemia in chronic kidney disease: Secondary | ICD-10-CM | POA: Diagnosis not present

## 2021-10-12 DIAGNOSIS — S32501D Unspecified fracture of right pubis, subsequent encounter for fracture with routine healing: Secondary | ICD-10-CM | POA: Diagnosis not present

## 2021-10-12 DIAGNOSIS — E782 Mixed hyperlipidemia: Secondary | ICD-10-CM | POA: Diagnosis not present

## 2021-10-12 DIAGNOSIS — E871 Hypo-osmolality and hyponatremia: Secondary | ICD-10-CM | POA: Diagnosis not present

## 2021-10-12 DIAGNOSIS — N1832 Chronic kidney disease, stage 3b: Secondary | ICD-10-CM | POA: Diagnosis not present

## 2021-10-15 DIAGNOSIS — D631 Anemia in chronic kidney disease: Secondary | ICD-10-CM | POA: Diagnosis not present

## 2021-10-15 DIAGNOSIS — S32501D Unspecified fracture of right pubis, subsequent encounter for fracture with routine healing: Secondary | ICD-10-CM | POA: Diagnosis not present

## 2021-10-15 DIAGNOSIS — E782 Mixed hyperlipidemia: Secondary | ICD-10-CM | POA: Diagnosis not present

## 2021-10-15 DIAGNOSIS — I129 Hypertensive chronic kidney disease with stage 1 through stage 4 chronic kidney disease, or unspecified chronic kidney disease: Secondary | ICD-10-CM | POA: Diagnosis not present

## 2021-10-15 DIAGNOSIS — N1832 Chronic kidney disease, stage 3b: Secondary | ICD-10-CM | POA: Diagnosis not present

## 2021-10-15 DIAGNOSIS — E871 Hypo-osmolality and hyponatremia: Secondary | ICD-10-CM | POA: Diagnosis not present

## 2021-10-16 DIAGNOSIS — S32501D Unspecified fracture of right pubis, subsequent encounter for fracture with routine healing: Secondary | ICD-10-CM | POA: Diagnosis not present

## 2021-10-16 DIAGNOSIS — E871 Hypo-osmolality and hyponatremia: Secondary | ICD-10-CM | POA: Diagnosis not present

## 2021-10-16 DIAGNOSIS — D631 Anemia in chronic kidney disease: Secondary | ICD-10-CM | POA: Diagnosis not present

## 2021-10-16 DIAGNOSIS — N1832 Chronic kidney disease, stage 3b: Secondary | ICD-10-CM | POA: Diagnosis not present

## 2021-10-16 DIAGNOSIS — I129 Hypertensive chronic kidney disease with stage 1 through stage 4 chronic kidney disease, or unspecified chronic kidney disease: Secondary | ICD-10-CM | POA: Diagnosis not present

## 2021-10-16 DIAGNOSIS — E782 Mixed hyperlipidemia: Secondary | ICD-10-CM | POA: Diagnosis not present

## 2021-10-20 DIAGNOSIS — E782 Mixed hyperlipidemia: Secondary | ICD-10-CM | POA: Diagnosis not present

## 2021-10-20 DIAGNOSIS — I129 Hypertensive chronic kidney disease with stage 1 through stage 4 chronic kidney disease, or unspecified chronic kidney disease: Secondary | ICD-10-CM | POA: Diagnosis not present

## 2021-10-20 DIAGNOSIS — N1832 Chronic kidney disease, stage 3b: Secondary | ICD-10-CM | POA: Diagnosis not present

## 2021-10-20 DIAGNOSIS — E871 Hypo-osmolality and hyponatremia: Secondary | ICD-10-CM | POA: Diagnosis not present

## 2021-10-20 DIAGNOSIS — S32501D Unspecified fracture of right pubis, subsequent encounter for fracture with routine healing: Secondary | ICD-10-CM | POA: Diagnosis not present

## 2021-10-20 DIAGNOSIS — D631 Anemia in chronic kidney disease: Secondary | ICD-10-CM | POA: Diagnosis not present

## 2021-10-21 ENCOUNTER — Ambulatory Visit (INDEPENDENT_AMBULATORY_CARE_PROVIDER_SITE_OTHER): Payer: Medicare Other | Admitting: Podiatry

## 2021-10-21 ENCOUNTER — Encounter: Payer: Self-pay | Admitting: Podiatry

## 2021-10-21 DIAGNOSIS — M79674 Pain in right toe(s): Secondary | ICD-10-CM

## 2021-10-21 DIAGNOSIS — M79675 Pain in left toe(s): Secondary | ICD-10-CM

## 2021-10-21 DIAGNOSIS — B351 Tinea unguium: Secondary | ICD-10-CM | POA: Diagnosis not present

## 2021-10-23 ENCOUNTER — Telehealth: Payer: Self-pay | Admitting: Family Medicine

## 2021-10-23 NOTE — Telephone Encounter (Signed)
OT [facility] states pt was not available for appointment and rescheduled for the follow week (week of 10/26/21). ?

## 2021-10-26 DIAGNOSIS — E876 Hypokalemia: Secondary | ICD-10-CM | POA: Diagnosis not present

## 2021-10-26 DIAGNOSIS — Z7982 Long term (current) use of aspirin: Secondary | ICD-10-CM | POA: Diagnosis not present

## 2021-10-26 DIAGNOSIS — Z7901 Long term (current) use of anticoagulants: Secondary | ICD-10-CM | POA: Diagnosis not present

## 2021-10-26 DIAGNOSIS — D631 Anemia in chronic kidney disease: Secondary | ICD-10-CM | POA: Diagnosis not present

## 2021-10-26 DIAGNOSIS — G8929 Other chronic pain: Secondary | ICD-10-CM | POA: Diagnosis not present

## 2021-10-26 DIAGNOSIS — S32501D Unspecified fracture of right pubis, subsequent encounter for fracture with routine healing: Secondary | ICD-10-CM | POA: Diagnosis not present

## 2021-10-26 DIAGNOSIS — N1832 Chronic kidney disease, stage 3b: Secondary | ICD-10-CM | POA: Diagnosis not present

## 2021-10-26 DIAGNOSIS — Z9049 Acquired absence of other specified parts of digestive tract: Secondary | ICD-10-CM | POA: Diagnosis not present

## 2021-10-26 DIAGNOSIS — I495 Sick sinus syndrome: Secondary | ICD-10-CM | POA: Diagnosis not present

## 2021-10-26 DIAGNOSIS — I442 Atrioventricular block, complete: Secondary | ICD-10-CM | POA: Diagnosis not present

## 2021-10-26 DIAGNOSIS — J309 Allergic rhinitis, unspecified: Secondary | ICD-10-CM | POA: Diagnosis not present

## 2021-10-26 DIAGNOSIS — K219 Gastro-esophageal reflux disease without esophagitis: Secondary | ICD-10-CM | POA: Diagnosis not present

## 2021-10-26 DIAGNOSIS — Z950 Presence of cardiac pacemaker: Secondary | ICD-10-CM | POA: Diagnosis not present

## 2021-10-26 DIAGNOSIS — I129 Hypertensive chronic kidney disease with stage 1 through stage 4 chronic kidney disease, or unspecified chronic kidney disease: Secondary | ICD-10-CM | POA: Diagnosis not present

## 2021-10-26 DIAGNOSIS — F419 Anxiety disorder, unspecified: Secondary | ICD-10-CM | POA: Diagnosis not present

## 2021-10-26 DIAGNOSIS — M549 Dorsalgia, unspecified: Secondary | ICD-10-CM | POA: Diagnosis not present

## 2021-10-26 DIAGNOSIS — I480 Paroxysmal atrial fibrillation: Secondary | ICD-10-CM | POA: Diagnosis not present

## 2021-10-26 DIAGNOSIS — F39 Unspecified mood [affective] disorder: Secondary | ICD-10-CM | POA: Diagnosis not present

## 2021-10-26 DIAGNOSIS — E782 Mixed hyperlipidemia: Secondary | ICD-10-CM | POA: Diagnosis not present

## 2021-10-26 DIAGNOSIS — E871 Hypo-osmolality and hyponatremia: Secondary | ICD-10-CM | POA: Diagnosis not present

## 2021-10-26 DIAGNOSIS — Z8701 Personal history of pneumonia (recurrent): Secondary | ICD-10-CM | POA: Diagnosis not present

## 2021-10-27 ENCOUNTER — Ambulatory Visit (INDEPENDENT_AMBULATORY_CARE_PROVIDER_SITE_OTHER): Payer: Medicare Other

## 2021-10-27 DIAGNOSIS — Z0000 Encounter for general adult medical examination without abnormal findings: Secondary | ICD-10-CM

## 2021-10-27 NOTE — Patient Instructions (Signed)
[name] , ?Thank you for taking time to come for your Medicare Wellness Visit. I appreciate your ongoing commitment to your health goals. Please review the following plan we discussed and let me know if I can assist you in the future.  ? ?Screening recommendations/referrals: ?Colonoscopy: no longer required  ?Mammogram: no longer required ?Bone Density: completed 04/01/10 ?Recommended yearly ophthalmology/optometry visit for glaucoma screening and checkup ?Recommended yearly dental visit for hygiene and checkup ? ?Vaccinations: ?Influenza vaccine: due  ?Pneumococcal vaccine: Up to date ?Tdap vaccine: due ?Shingles vaccine: completed 3/5, 11/03/17   ?Covid-19:completed 4/24, 5/15, 05/10/20 ? ?Advanced directives: Advance directive discussed with you today. Even though you declined this today please call our office should you change your mind and we can give you the proper paperwork for you to fill out. ? ?Conditions/risks identified: none at this time ? ?Next appointment: Follow up in one year for your annual wellness visit  ? ? ?Preventive Care 86 Years and Older, Female ?Preventive care refers to lifestyle choices and visits with your health care provider that can promote health and wellness. ?What does preventive care include? ?A yearly physical exam. This is also called an annual well check. ?Dental exams once or twice a year. ?Routine eye exams. Ask your health care provider how often you should have your eyes checked. ?Personal lifestyle choices, including: ?Daily care of your teeth and gums. ?Regular physical activity. ?Eating a healthy diet. ?Avoiding tobacco and drug use. ?Limiting alcohol use. ?Practicing safe sex. ?Taking low-dose aspirin every day. ?Taking vitamin and mineral supplements as recommended by your health care provider. ?What happens during an annual well check? ?The services and screenings done by your health care provider during your annual well check will depend on your age, overall  health, lifestyle risk factors, and family history of disease. ?Counseling  ?Your health care provider may ask you questions about your: ?Alcohol use. ?Tobacco use. ?Drug use. ?Emotional well-being. ?Home and relationship well-being. ?Sexual activity. ?Eating habits. ?History of falls. ?Memory and ability to understand (cognition). ?Work and work Statistician. ?Reproductive health. ?Screening  ?You may have the following tests or measurements: ?Height, weight, and BMI. ?Blood pressure. ?Lipid and cholesterol levels. These may be checked every 5 years, or more frequently if you are over 86 years old. ?Skin check. ?Lung cancer screening. You may have this screening every year starting at age 23 if you have a 30-pack-year history of smoking and currently smoke or have quit within the past 15 years. ?Fecal occult blood test (FOBT) of the stool. You may have this test every year starting at age 19. ?Flexible sigmoidoscopy or colonoscopy. You may have a sigmoidoscopy every 5 years or a colonoscopy every 10 years starting at age 86. ?Hepatitis C blood test. ?Hepatitis B blood test. ?Sexually transmitted disease (STD) testing. ?Diabetes screening. This is done by checking your blood sugar (glucose) after you have not eaten for a while (fasting). You may have this done every 1-3 years. ?Bone density scan. This is done to screen for osteoporosis. You may have this done starting at age 86. ?Mammogram. This may be done every 1-2 years. Talk to your health care provider about how often you should have regular mammograms. ?Talk with your health care provider about your test results, treatment options, and if necessary, the need for more tests. ?Vaccines  ?Your health care provider may recommend certain vaccines, such as: ?Influenza vaccine. This is recommended every year. ?Tetanus, diphtheria, and acellular pertussis (Tdap, Td) vaccine. You may need  a Td booster every 10 years. ?Zoster vaccine. You may need this after age  86. ?Pneumococcal 13-valent conjugate (PCV13) vaccine. One dose is recommended after age 86. ?Pneumococcal polysaccharide (PPSV23) vaccine. One dose is recommended after age 86. ?Talk to your health care provider about which screenings and vaccines you need and how often you need them. ?This information is not intended to replace advice given to you by your health care provider. Make sure you discuss any questions you have with your health care provider. ?Document Released: 07/04/2015 Document Revised: 02/25/2016 Document Reviewed: 04/08/2015 ?Elsevier Interactive Patient Education ? 2017 [place]. ? ?Fall Prevention in the Home ?Falls can cause injuries. They can happen to people of all ages. There are many things you can do to make your home safe and to help prevent falls. ?What can I do on the outside of my home? ?Regularly fix the edges of walkways and driveways and fix any cracks. ?Remove anything that might make you trip as you walk through a door, such as a raised step or threshold. ?Trim any bushes or trees on the path to your home. ?Use bright outdoor lighting. ?Clear any walking paths of anything that might make someone trip, such as rocks or tools. ?Regularly check to see if handrails are loose or broken. Make sure that both sides of any steps have handrails. ?Any raised decks and porches should have guardrails on the edges. ?Have any leaves, snow, or ice cleared regularly. ?Use sand or salt on walking paths during winter. ?Clean up any spills in your garage right away. This includes oil or grease spills. ?What can I do in the bathroom? ?Use night lights. ?Install grab bars by the toilet and in the tub and shower. Do not use towel bars as grab bars. ?Use non-skid mats or decals in the tub or shower. ?If you need to sit down in the shower, use a plastic, non-slip stool. ?Keep the floor dry. Clean up any water that spills on the floor as soon as it happens. ?Remove soap buildup in the tub or shower  regularly. ?Attach bath mats securely with double-sided non-slip rug tape. ?Do not have throw rugs and other things on the floor that can make you trip. ?What can I do in the bedroom? ?Use night lights. ?Make sure that you have a light by your bed that is easy to reach. ?Do not use any sheets or blankets that are too big for your bed. They should not hang down onto the floor. ?Have a firm chair that has side arms. You can use this for support while you get dressed. ?Do not have throw rugs and other things on the floor that can make you trip. ?What can I do in the kitchen? ?Clean up any spills right away. ?Avoid walking on wet floors. ?Keep items that you use a lot in easy-to-reach places. ?If you need to reach something above you, use a strong step stool that has a grab bar. ?Keep electrical cords out of the way. ?Do not use floor polish or wax that makes floors slippery. If you must use wax, use non-skid floor wax. ?Do not have throw rugs and other things on the floor that can make you trip. ?What can I do with my stairs? ?Do not leave any items on the stairs. ?Make sure that there are handrails on both sides of the stairs and use them. Fix handrails that are broken or loose. Make sure that handrails are as long as the stairways. ?Check  any carpeting to make sure that it is firmly attached to the stairs. Fix any carpet that is loose or worn. ?Avoid having throw rugs at the top or bottom of the stairs. If you do have throw rugs, attach them to the floor with carpet tape. ?Make sure that you have a light switch at the top of the stairs and the bottom of the stairs. If you do not have them, ask someone to add them for you. ?What else can I do to help prevent falls? ?Wear shoes that: ?Do not have high heels. ?Have rubber bottoms. ?Are comfortable and fit you well. ?Are closed at the toe. Do not wear sandals. ?If you use a stepladder: ?Make sure that it is fully opened. Do not climb a closed stepladder. ?Make sure that  both sides of the stepladder are locked into place. ?Ask someone to hold it for you, if possible. ?Clearly mark and make sure that you can see: ?Any grab bars or handrails. ?First and last steps. ?Where the edge of e

## 2021-10-27 NOTE — Progress Notes (Signed)
Virtual Visit via Telephone Note ? ?I connected with  [name] on 10/27/21 at  9:30 AM EDT by telephone and verified that I am speaking with the correct person using two identifiers. ? ?Medicare Annual Wellness visit completed telephonically due to Covid-19 pandemic.  ? ?Persons participating in this call: This Health Coach and this patient.  ? ?Location: ?Patient: home ?Provider: office ?  ?I discussed the limitations, risks, security and privacy concerns of performing an evaluation and management service by telephone and the availability of in person appointments. The patient expressed understanding and agreed to proceed. ? ?Unable to perform video visit due to video visit attempted and failed and/or patient does not have video capability.  ? ?Some vital signs may be absent or patient reported.  ? ?Willette Brace, LPN ? ? ?Subjective:  ? [name] is a 88 y.o. female who presents for an Initial Medicare Annual Wellness Visit. ? ?Review of Systems    ? ?Cardiac Risk Factors include: advanced age (>29mn, >>81women);hypertension;dyslipidemia ? ?   ?Objective:  ?  ?There were no vitals filed for this visit. ?There is no height or weight on file to calculate BMI. ? ? ?  10/27/2021  ?  9:37 AM 07/17/2021  ? 12:25 PM 07/14/2021  ? 12:00 PM 08/26/2020  ?  2:07 PM 02/09/2017  ? 12:19 PM 01/03/2015  ?  2:58 PM 10/16/2014  ?  7:50 PM  ?Advanced Directives  ?Does Patient Have a Medical Advance Directive? No No No Yes No No No  ?Type of Advance Directive    Living will;Healthcare Power of Attorney     ?Would patient like information on creating a medical advance directive? No - Patient declined No - Patient declined No - Patient declined  Yes (MAU/Ambulatory/Procedural Areas - Information given)    ? ? ?Current Medications (verified) ?Outpatient Encounter Medications as of 10/27/2021  ?Medication Sig  ? Acetaminophen (TYLENOL) 325 MG CAPS Tylenol  ? Ascorbic Acid (VITAMIN C) 100 MG tablet 500 mg daily.  ? aspirin 81 MG  chewable tablet Chew 81 mg by mouth at bedtime.  ? calcium carbonate (TUMS EX) 750 MG chewable tablet Chew 1 tablet by mouth daily as needed for heartburn.  ? cetirizine (ZYRTEC) 10 MG tablet   ? cholecalciferol (VITAMIN D3) 25 MCG (1000 UNIT) tablet Take 1 tablet (1,000 Units total) by mouth daily.  ? diltiazem (CARDIZEM CD) 120 MG 24 hr capsule Take 1 capsule (120 mg total) by mouth at bedtime.  ? diltiazem (CARDIZEM CD) 180 MG 24 hr capsule Take by mouth.  ? docusate sodium (COLACE) 100 MG capsule Take 1 capsule (100 mg total) by mouth 2 (two) times daily.  ? furosemide (LASIX) 40 MG tablet Take 1 tablet (40 mg total) by mouth daily. May take one extra tablet daily as needed for edema.  ? LORazepam (ATIVAN) 0.5 MG tablet Take 1 tablet (0.5 mg total) by mouth daily as needed for anxiety.  ? methocarbamol (ROBAXIN) 500 MG tablet Take 1 tablet (500 mg total) by mouth every 8 (eight) hours as needed for muscle spasms.  ? metoprolol succinate (TOPROL-XL) 25 MG 24 hr tablet Take 1 tablet (25 mg total) by mouth at bedtime.  ? potassium chloride SA (KLOR-CON M20) 20 MEQ tablet Take 1 tablet (20 mEq total) by mouth daily. Take an extra tablet once daily when you take the extra furosemide  ? pravastatin (PRAVACHOL) 40 MG tablet TAKE 1 TABLET BY MOUTH EVERY DAY  ? tiZANidine (ZANAFLEX)  2 MG tablet tizanidine 2 mg tablet ? Take 1 tablet 3 times a day by oral route as needed.  ? valsartan-hydrochlorothiazide (DIOVAN-HCT) 160-25 MG tablet Take 1 tablet by mouth daily.  ? pantoprazole (PROTONIX) 40 MG tablet Take 2 tablets (80 mg total) by mouth daily.  ? [DISCONTINUED] HYDROcodone-acetaminophen (NORCO/VICODIN) 5-325 MG tablet Take by mouth.  ? ?No facility-administered encounter medications on file as of 10/27/2021.  ? ? ?Allergies (verified) ?Fluticasone-salmeterol, Sulfa antibiotics, Sulfonamide derivatives, Tetracycline hcl, Benzonatate, Erythromycin, Meloxicam, and Sulfamethoxazole  ? ?History: ?Past Medical History:   ?Diagnosis Date  ? Allergic rhinitis   ? Anginal pain ([place])   ? Asthmatic bronchitis   ? Chronic cough   ? Dysrhythmia   ? PAF  ? Esophageal reflux   ? GERD (gastroesophageal reflux disease)   ? History of stress test 11/20/2009  ? This was essentially normal,post-stress EF was hyperdynamic at 73 beats per mins, There was no scar or ischemia.  ? Hx of echocardiogram 11/05/2010  ? EF>55% showed a normal systolic function with impaired diastolic dysfuntion and also tissue doppler suggesting increased elevated left atrial pressure, she had moderate LA dilatation and mild LA dilatation. There was calcified mitral apparatus with moderate MR, mild to moderate TR and trace aortic insufficiency.  ? Hyperlipemia   ? Hypertension   ? Hypothyroidism   ? PAF (paroxysmal atrial fibrillation) ([place])   ? Pneumonia   ? ?Past Surgical History:  ?Procedure Laterality Date  ? CHOLECYSTECTOMY    ? CYSTOSCOPY WITH BIOPSY N/A 08/29/2020  ? Procedure: CYSTOSCOPY WITH BIOPSY WITH FULGURATION;  Surgeon: Irine Seal, MD;  Location: [facility];  Service: Urology;  Laterality: N/A;  ? FOOT SURGERY    ? right  ? NASAL SEPTUM SURGERY    ? PACEMAKER INSERTION  June 1st 2009  ? 6 [employer] Str[employer]more device, model #5826, serial [id number], The artial lead is a Interior and spatial designer, the ventricular lead is Environmental education officer.  ? PPM GENERATOR CHANGEOUT N/A 02/09/2017  ? Procedure: PPM GENERATOR CHANGEOUT;  Surgeon: Sanda Klein, MD;  Location: [facility];  Service: Cardiovascular;  Laterality: N/A;  ? THYROID SURGERY    ? TONSILLECTOMY    ? TONSILLECTOMY    ? VESICOVAGINAL FISTULA CLOSURE W/ TAH    ? ?Family History  ?Problem Relation Age of Onset  ? Stroke Mother   ? Cancer Father   ?     bladder  ? Stroke Sister   ? ?Social History  ? ?Socioeconomic History  ? Marital status: Widowed  ?  Spouse name: Not on file  ? Number of children: Not on file  ? Years of education: Not on file  ? Highest education level: Not on file  ?Occupational History  ?  Employer:  RETIRED  ?  Comment: Retired Sales promotion account executive at [employer]  ?Tobacco Use  ? Smoking status: Never  ? Smokeless tobacco: Never  ?Vaping Use  ? Vaping Use: Never used  ?Substance and Sexual Activity  ? Alcohol use: No  ? Drug use: No  ? Sexual activity: Not on file  ?Other Topics Concern  ? Not on file  ?Social History Narrative  ? Not on file  ? ?Social Determinants of Health  ? ?Financial Resource Strain: Low Risk   ? Difficulty of Paying Living Expenses: Not hard at all  ?Food Insecurity: No Food Insecurity  ? Worried About Charity fundraiser in the Last Year: Never true  ? Ran Out of Food in  the Last Year: Never true  ?Transportation Needs: No Transportation Needs  ? Lack of Transportation (Medical): No  ? Lack of Transportation (Non-Medical): No  ?Physical Activity: Insufficiently Active  ? Days of Exercise per Week: 5 days  ? Minutes of Exercise per Session: 10 min  ?Stress: No Stress Concern Present  ? Feeling of Stress : Not at all  ?Social Connections: Socially Isolated  ? Frequency of Communication with Friends and Family: More than three times a week  ? Frequency of Social Gatherings with Friends and Family: Twice a week  ? Attends Religious Services: Never  ? Active Member of Clubs or Organizations: No  ? Attends Archivist Meetings: Never  ? Marital Status: Widowed  ? ? ?Tobacco Counseling ?Counseling given: Not Answered ? ? ?Clinical Intake: ? ?Pre-visit preparation completed: Yes ? ?Pain : No/denies pain ? ?  ? ?BMI - recorded: 29.29 ?Nutritional Status: BMI 25 -29 Overweight ?Nutritional Risks: None ?Diabetes: No ? ?How often do you need to have someone help you when you read instructions, pamphlets, or other written materials from your doctor or pharmacy?: 1 - Never ? ?Diabetic?no ? ?Interpreter Needed?: No ? ?Information entered by :: Charlott Rakes, LPN ? ? ?Activities of Daily Living ? ?  10/27/2021  ?  9:38 AM 07/17/2021  ? 12:25 PM  ?In your present state of health, do you have  any difficulty performing the following activities:  ?Hearing? 0 0  ?Vision? 0 0  ?Difficulty concentrating or making decisions? 0 0  ?Walking or climbing stairs? 0 0  ?Dressing or bathing? 0 0  ?Doing errands, shoppin

## 2021-10-28 ENCOUNTER — Ambulatory Visit (INDEPENDENT_AMBULATORY_CARE_PROVIDER_SITE_OTHER): Payer: Medicare Other | Admitting: Family Medicine

## 2021-10-28 ENCOUNTER — Encounter: Payer: Self-pay | Admitting: Family Medicine

## 2021-10-28 VITALS — BP 110/50 | HR 73 | Temp 98.1°F | Ht 59.0 in | Wt 148.2 lb

## 2021-10-28 DIAGNOSIS — F5101 Primary insomnia: Secondary | ICD-10-CM

## 2021-10-28 DIAGNOSIS — Z950 Presence of cardiac pacemaker: Secondary | ICD-10-CM

## 2021-10-28 DIAGNOSIS — I442 Atrioventricular block, complete: Secondary | ICD-10-CM

## 2021-10-28 DIAGNOSIS — E039 Hypothyroidism, unspecified: Secondary | ICD-10-CM | POA: Diagnosis not present

## 2021-10-28 DIAGNOSIS — N1831 Chronic kidney disease, stage 3a: Secondary | ICD-10-CM

## 2021-10-28 DIAGNOSIS — I10 Essential (primary) hypertension: Secondary | ICD-10-CM | POA: Diagnosis not present

## 2021-10-28 DIAGNOSIS — I495 Sick sinus syndrome: Secondary | ICD-10-CM | POA: Diagnosis not present

## 2021-10-28 MED ORDER — TRAZODONE HCL 50 MG PO TABS
25.0000 mg | ORAL_TABLET | Freq: Every evening | ORAL | 1 refills | Status: DC | PRN
Start: 2021-10-28 — End: 2022-01-26

## 2021-10-28 NOTE — Progress Notes (Signed)
? ? ?Subjective  ?CC:  ?Chief Complaint  ?Patient presents with  ? Hip Injury  ?  Pt here to F/U with pelvis injury  ? ? ?HPI: [name] is a 88 y.o. female who presents to the office today to address the problems listed above in the chief complaint. ?Hypertension and cardiology f/u: Control is good . Pt reports she is doing well.  I reviewed office visit from February from cardiology.  Blood pressure remains well controlled.  She has a pacemaker.  History of complete heart block, function is good.  No symptoms of arrhythmia.  No medication changes recommended. ?Chronic lower extremity edema: On Lasix.  20-40 daily.  Still with swelling but it is variable.  She reports low-sodium diet. ?Complains of fatigue and poor sleep.  At this point, she tends to sleep during the day and has struggles during the night.  Never has been on sleep medicines.  Does not snore. ?Stage III kidney disease: Most recent lab work shows stability.  She does have chronic edema. ?Assessment  ?1. Essential hypertension   ?2. Pacemaker   ?3. CHB (complete heart block) [facility]) s/p pacemaker   ?4. SSS (sick sinus syndrome) ([place])   ?5. Primary hypothyroidism   ?6. Stage 3a chronic kidney disease (CKD) ([place])   ?7. Primary insomnia   ? ?  ?Plan  ? ?Hypertension f/u: BP control is well controlled.  Continue current medications ?Pacemaker f/u: Stable per cardiology ?Hypothyroidism: Well-controlled.   ?Kidney disease: We will extremity edema, increase Lasix, can double it daily if needed.  Double potassium supplements.  Elevate legs. ?Insomnia: Chronic condition.  Reports normal cognitive screen by recent annual wellness visit over the phone by Medicare.  Today answers questions appropriately.  Suspect fatigue and fogginess due to insomnia.  Trial of low-dose trazodone.  Risk benefits discussed ?Education regarding management of these chronic disease states was given. Management strategies discussed on successive visits include dietary and exercise  recommendations, goals of achieving and maintaining IBW, and lifestyle modifications aiming for adequate sleep and minimizing stressors.  ? ?Follow up: 6 months for complete physical and recheck. ? ?No orders of the defined types were placed in this encounter. ? ?Meds ordered this encounter  ?Medications  ? traZODone (DESYREL) 50 MG tablet  ?  Sig: Take 0.5 tablets (25 mg total) by mouth at bedtime as needed for sleep.  ?  Dispense:  30 tablet  ?  Refill:  1  ? ?  ? ?BP Readings from Last 3 Encounters:  ?10/28/21 (!) 110/50  ?08/10/21 (!) 122/55  ?07/29/21 (!) 156/82  ? ?Wt Readings from Last 3 Encounters:  ?10/28/21 148 lb 3.2 oz (67.2 kg)  ?08/10/21 145 lb (65.8 kg)  ?07/17/21 159 lb 13.3 oz (72.5 kg)  ? ? ?Lab Results  ?Component Value Date  ? CHOL 144 04/27/2021  ? CHOL 136 03/13/2015  ? CHOL  11/19/2007  ?  153        ?ATP III CLASSIFICATION: ? <200     mg/dL   Desirable ? 200-239  mg/dL   Borderline High ? >=240    mg/dL   High  ? ?Lab Results  ?Component Value Date  ? HDL 76.10 04/27/2021  ? HDL 57 03/13/2015  ? HDL 59 11/19/2007  ? ?Lab Results  ?Component Value Date  ? [place] 57 04/27/2021  ? [place] 60 03/13/2015  ? [place]  11/19/2007  ?  77        ?Total Cholesterol/HDL:CHD Risk ?Coronary Heart  Disease Risk Table ?                    Men   Women ? 1/2 Average Risk   3.4   3.3  ? ?Lab Results  ?Component Value Date  ? TRIG 55.0 04/27/2021  ? TRIG 95 03/13/2015  ? TRIG 87 11/19/2007  ? ?Lab Results  ?Component Value Date  ? CHOLHDL 2 04/27/2021  ? CHOLHDL 2.4 03/13/2015  ? CHOLHDL 2.6 11/19/2007  ? ?No results found for: LDLDIRECT ?Lab Results  ?Component Value Date  ? CREATININE 1.00 07/29/2021  ? BUN 19 07/29/2021  ? NA 133 (L) 07/29/2021  ? K 4.1 07/29/2021  ? CL 93 (L) 07/29/2021  ? CO2 30 07/29/2021  ? ? ?The ASCVD Risk score (Arnett DK, et al., 2019) failed to calculate for the following reasons: ?  The 2019 ASCVD risk score is only valid for ages 19 to 1 ? ?I reviewed the patients updated PMH, FH,  and SocHx.  ?  ?Patient Active Problem List  ? Diagnosis Date Noted  ? SSS (sick sinus syndrome) ([place]) 06/15/2017  ?  Priority: High  ? PAT (paroxysmal atrial tachycardia) ([place]) 06/15/2017  ?  Priority: High  ? Impaired fasting glucose 07/27/2016  ?  Priority: High  ? CHB (complete heart block) ([facility]) s/p pacemaker 05/17/2013  ?  Priority: High  ? Pacemaker 02/04/2013  ?  Priority: High  ? Essential hypertension 02/04/2013  ?  Priority: High  ? Mixed hyperlipidemia 02/04/2013  ?  Priority: High  ? Early stage nonexudative age-related macular degeneration of right eye 03/06/2020  ?  Priority: Medium   ? Early stage nonexudative age-related macular degeneration of left eye 03/06/2020  ?  Priority: Medium   ? Laryngopharyngeal reflux (LPR) 03/11/2017  ?  Priority: Medium   ? Primary hypothyroidism 07/27/2016  ?  Priority: Medium   ? Lower extremity edema 11/22/2014  ?  Priority: Medium   ? Asthma 02/20/2010  ?  Priority: Medium   ? GERD (gastroesophageal reflux disease) 10/26/2007  ?  Priority: Medium   ? Chronic cough 10/26/2007  ?  Priority: Medium   ? Choroidal nevus of right eye 03/06/2020  ?  Priority: Low  ? Pseudophakia 03/06/2020  ?  Priority: Low  ? Vitamin D deficiency 07/27/2016  ?  Priority: Low  ? Allergic rhinitis due to pollen 08/21/2010  ?  Priority: Low  ? Primary insomnia 10/28/2021  ? Pelvic fracture ([place]) 07/17/2021  ? Normocytic anemia 07/14/2021  ? Fracture of right superior pubic ramus, closed, initial encounter ([place]) 07/13/2021  ? Stage 3a chronic kidney disease (CKD) ([place]) 04/27/2021  ? Osteopenia 04/27/2021  ? Adjustment disorder with anxiety 04/27/2021  ? ? ?Allergies: Fluticasone-salmeterol, Sulfa antibiotics, Sulfonamide derivatives, Tetracycline hcl, Benzonatate, Erythromycin, Meloxicam, and Sulfamethoxazole ? ?Social History: ?Patient  reports that she has never smoked. She has never used smokeless tobacco. She reports that she does not drink alcohol and does not use drugs. ? ?Current Meds   ?Medication Sig  ? Acetaminophen (TYLENOL) 325 MG CAPS Tylenol  ? Ascorbic Acid (VITAMIN C) 100 MG tablet 500 mg daily.  ? aspirin 81 MG chewable tablet Chew 81 mg by mouth at bedtime.  ? calcium carbonate (TUMS EX) 750 MG chewable tablet Chew 1 tablet by mouth daily as needed for heartburn.  ? cetirizine (ZYRTEC) 10 MG tablet   ? cholecalciferol (VITAMIN D3) 25 MCG (1000 UNIT) tablet Take 1 tablet (1,000 Units total) by mouth  daily.  ? diltiazem (CARDIZEM CD) 120 MG 24 hr capsule Take 1 capsule (120 mg total) by mouth at bedtime.  ? diltiazem (CARDIZEM CD) 180 MG 24 hr capsule Take by mouth.  ? docusate sodium (COLACE) 100 MG capsule Take 1 capsule (100 mg total) by mouth 2 (two) times daily.  ? furosemide (LASIX) 40 MG tablet Take 1 tablet (40 mg total) by mouth daily. May take one extra tablet daily as needed for edema.  ? LORazepam (ATIVAN) 0.5 MG tablet Take 1 tablet (0.5 mg total) by mouth daily as needed for anxiety.  ? methocarbamol (ROBAXIN) 500 MG tablet Take 1 tablet (500 mg total) by mouth every 8 (eight) hours as needed for muscle spasms.  ? metoprolol succinate (TOPROL-XL) 25 MG 24 hr tablet Take 1 tablet (25 mg total) by mouth at bedtime.  ? potassium chloride SA (KLOR-CON M20) 20 MEQ tablet Take 1 tablet (20 mEq total) by mouth daily. Take an extra tablet once daily when you take the extra furosemide  ? pravastatin (PRAVACHOL) 40 MG tablet TAKE 1 TABLET BY MOUTH EVERY DAY  ? tiZANidine (ZANAFLEX) 2 MG tablet tizanidine 2 mg tablet ? Take 1 tablet 3 times a day by oral route as needed.  ? traZODone (DESYREL) 50 MG tablet Take 0.5 tablets (25 mg total) by mouth at bedtime as needed for sleep.  ? valsartan-hydrochlorothiazide (DIOVAN-HCT) 160-25 MG tablet Take 1 tablet by mouth daily.  ? ? ?Review of Systems: ?Cardiovascular: negative for chest pain, palpitations, leg swelling, orthopnea ?Respiratory: negative for SOB, wheezing or persistent cough ?Gastrointestinal: negative for abdominal  pain ?Genitourinary: negative for dysuria or gross hematuria ? ?Objective  ?Vitals: BP (!) 110/50 Comment: Left arm  Pulse 73   Temp 98.1 ?F (36.7 ?C)   Ht '4\' 11"'$  (1.499 m)   Wt 148 lb 3.2 oz (67.2 kg)   SpO2 97

## 2021-10-28 NOTE — Patient Instructions (Signed)
Please return in 6 months for your annual complete physical; please come fasting.  ? ?Try taking 1/2 of the trazodone at night to see if it helps you sleep.  ? ?If you have any questions or concerns, please don't hesitate to send me a message via MyChart or call the office at [phone]. Thank you for visiting with [place] today! It's our pleasure caring for you.  ?

## 2021-10-30 ENCOUNTER — Telehealth: Payer: Self-pay | Admitting: Family Medicine

## 2021-10-30 NOTE — Telephone Encounter (Signed)
..  Home Health Certification or Plan of Care Tracking ? ?Is this a Certification or Plan of Care? Yes ? ?[place] Agency: [place]  ? ?Order Number:  [id number] ? ?Has charge sheet been attached? yes ? ?Where has form been placed:  In provider's box ? ?Faxed to:   [phone] ?

## 2021-10-30 NOTE — Telephone Encounter (Signed)
Form given to provider for review and signature. ?

## 2021-11-01 NOTE — Progress Notes (Signed)
?  Subjective:  ?Patient ID: [name], female    DOB: [birth year],  MRN: [id number] ? ?88 y.o. female presents painful elongated mycotic toenails 1-5 bilaterally which are tender when wearing enclosed shoe gear. Pain is relieved with periodic professional debridement. ? ?New problem(s): None  ? ?Her son is present during today's visit. ? ?PCP is Leamon Arnt, MD , and last visit was Oct 28, 2021. ? ?Allergies  ?Allergen Reactions  ? Fluticasone-Salmeterol Other (See Comments)  ?  unknown ?Other reaction(s): tongue/throat symptoms  ? Sulfa Antibiotics   ?  Other reaction(s): gastric intol  ? Sulfonamide Derivatives Other (See Comments)  ?  unknown  ? Tetracycline Hcl   ? Benzonatate Other (See Comments)  ?  tongue/throat symptoms in the past but tolerated more recently ?Other reaction(s): and tongue/throat symptoms in the past (not more recently)  ? Erythromycin Nausea And Vomiting and Other (See Comments)  ?  Stomach cramps ?Other reaction(s): gastric intol  ? Meloxicam Other (See Comments)  ?  Mild dyspnea ?Other reaction(s): dyspnea  ? Sulfamethoxazole Other (See Comments)  ?  Gastric intolerance  ? ? ?Review of Systems: Negative except as noted in the HPI.  ? ?Objective:  ?Vascular Examination: ?Vascular status intact b/l with palpable pedal pulses. CFT immediate b/l. No edema. No pain with calf compression b/l. Skin temperature gradient WNL b/l. Pedal hair sparse. ? ?Neurological Examination: ?Sensation grossly intact b/l with 10 gram monofilament. Vibratory sensation intact b/l.  ? ?Dermatological Examination: ?Pedal skin with normal turgor, texture and tone b/l. Toenails 1-5 b/l thick, discolored, elongated with subungual debris and pain on dorsal palpation. No hyperkeratotic lesions noted b/l.  ? ?Musculoskeletal Examination: ?Muscle strength 5/5 to b/l LE. No pain, crepitus or joint limitation noted with ROM bilateral LE. ? ?Radiographs: None ? ?Last A1c:   ? ?  Latest Ref Rng & Units 04/27/2021  ? 10:59  AM  ?Hemoglobin A1C  ?Hemoglobin-A1c 4.0 - 5.6 % 5.4    ? ?  ?Assessment:  ? ?1. Pain due to onychomycosis of toenails of both feet   ? ? ?Plan:  ?-Patient was evaluated and treated. All patient's and/or POA's questions/concerns answered on today's visit. ?-Patient to continue soft, supportive shoe gear daily. ?-Toenails 1-5 b/l were debrided in length and girth with sterile nail nippers and dremel without iatrogenic bleeding.  ?-Patient/POA to call should there be question/concern in the interim. ? ?Return in about 3 months (around 01/21/2022). ? ?Marzetta Board, DPM ?

## 2021-11-02 DIAGNOSIS — N1832 Chronic kidney disease, stage 3b: Secondary | ICD-10-CM | POA: Diagnosis not present

## 2021-11-02 DIAGNOSIS — D631 Anemia in chronic kidney disease: Secondary | ICD-10-CM | POA: Diagnosis not present

## 2021-11-02 DIAGNOSIS — E871 Hypo-osmolality and hyponatremia: Secondary | ICD-10-CM | POA: Diagnosis not present

## 2021-11-02 DIAGNOSIS — S32501D Unspecified fracture of right pubis, subsequent encounter for fracture with routine healing: Secondary | ICD-10-CM | POA: Diagnosis not present

## 2021-11-02 DIAGNOSIS — I129 Hypertensive chronic kidney disease with stage 1 through stage 4 chronic kidney disease, or unspecified chronic kidney disease: Secondary | ICD-10-CM | POA: Diagnosis not present

## 2021-11-02 DIAGNOSIS — E782 Mixed hyperlipidemia: Secondary | ICD-10-CM | POA: Diagnosis not present

## 2021-11-03 ENCOUNTER — Telehealth: Payer: Self-pay

## 2021-11-03 NOTE — Telephone Encounter (Signed)
Orders has been signed/faxed to [facility]. ?

## 2021-11-09 DIAGNOSIS — D631 Anemia in chronic kidney disease: Secondary | ICD-10-CM | POA: Diagnosis not present

## 2021-11-09 DIAGNOSIS — E782 Mixed hyperlipidemia: Secondary | ICD-10-CM | POA: Diagnosis not present

## 2021-11-09 DIAGNOSIS — S32501D Unspecified fracture of right pubis, subsequent encounter for fracture with routine healing: Secondary | ICD-10-CM | POA: Diagnosis not present

## 2021-11-09 DIAGNOSIS — N1832 Chronic kidney disease, stage 3b: Secondary | ICD-10-CM | POA: Diagnosis not present

## 2021-11-09 DIAGNOSIS — I129 Hypertensive chronic kidney disease with stage 1 through stage 4 chronic kidney disease, or unspecified chronic kidney disease: Secondary | ICD-10-CM | POA: Diagnosis not present

## 2021-11-09 DIAGNOSIS — E871 Hypo-osmolality and hyponatremia: Secondary | ICD-10-CM | POA: Diagnosis not present

## 2021-11-10 NOTE — Telephone Encounter (Addendum)
..  Home Health Verbal Orders  Agency: [place]: [facility] [phone]  Contact and title  Requesting OT/ PT/ Skilled nursing/ Social Work/ Speech:   PT  Reason for Request:   To further progress with transfers, bed mobility, and ambulatory functioning  Frequency:   Once a week, 3 more weeks  HH needs F2F w/in last 30 days

## 2021-11-13 DIAGNOSIS — S32501D Unspecified fracture of right pubis, subsequent encounter for fracture with routine healing: Secondary | ICD-10-CM | POA: Diagnosis not present

## 2021-11-13 DIAGNOSIS — E871 Hypo-osmolality and hyponatremia: Secondary | ICD-10-CM | POA: Diagnosis not present

## 2021-11-13 DIAGNOSIS — I129 Hypertensive chronic kidney disease with stage 1 through stage 4 chronic kidney disease, or unspecified chronic kidney disease: Secondary | ICD-10-CM | POA: Diagnosis not present

## 2021-11-13 DIAGNOSIS — D631 Anemia in chronic kidney disease: Secondary | ICD-10-CM | POA: Diagnosis not present

## 2021-11-13 DIAGNOSIS — N1832 Chronic kidney disease, stage 3b: Secondary | ICD-10-CM | POA: Diagnosis not present

## 2021-11-13 DIAGNOSIS — E782 Mixed hyperlipidemia: Secondary | ICD-10-CM | POA: Diagnosis not present

## 2021-11-20 ENCOUNTER — Telehealth: Payer: Self-pay | Admitting: Family Medicine

## 2021-11-20 DIAGNOSIS — E782 Mixed hyperlipidemia: Secondary | ICD-10-CM | POA: Diagnosis not present

## 2021-11-20 DIAGNOSIS — S32501D Unspecified fracture of right pubis, subsequent encounter for fracture with routine healing: Secondary | ICD-10-CM | POA: Diagnosis not present

## 2021-11-20 DIAGNOSIS — D631 Anemia in chronic kidney disease: Secondary | ICD-10-CM | POA: Diagnosis not present

## 2021-11-20 DIAGNOSIS — E871 Hypo-osmolality and hyponatremia: Secondary | ICD-10-CM | POA: Diagnosis not present

## 2021-11-20 DIAGNOSIS — N1832 Chronic kidney disease, stage 3b: Secondary | ICD-10-CM | POA: Diagnosis not present

## 2021-11-20 DIAGNOSIS — I129 Hypertensive chronic kidney disease with stage 1 through stage 4 chronic kidney disease, or unspecified chronic kidney disease: Secondary | ICD-10-CM | POA: Diagnosis not present

## 2021-11-20 NOTE — Telephone Encounter (Signed)
.  Home Health Verbal Orders  Agency:  [facility]   Caller: [place] and title: OT Therapist, [phone]  Requesting OT/ PT/ Skilled nursing/ Social Work/ Speech:  OT  Reason for Request:    Frequency:  Starting 11/25/21, 1x a week for 6 weeks.   HH needs F2F w/in last 30 days

## 2021-11-23 NOTE — Telephone Encounter (Signed)
LVM with ok for the verbal orders per Jonni Sanger.

## 2021-11-25 DIAGNOSIS — E876 Hypokalemia: Secondary | ICD-10-CM | POA: Diagnosis not present

## 2021-11-25 DIAGNOSIS — Z7982 Long term (current) use of aspirin: Secondary | ICD-10-CM | POA: Diagnosis not present

## 2021-11-25 DIAGNOSIS — Z7901 Long term (current) use of anticoagulants: Secondary | ICD-10-CM | POA: Diagnosis not present

## 2021-11-25 DIAGNOSIS — F5101 Primary insomnia: Secondary | ICD-10-CM | POA: Diagnosis not present

## 2021-11-25 DIAGNOSIS — I495 Sick sinus syndrome: Secondary | ICD-10-CM | POA: Diagnosis not present

## 2021-11-25 DIAGNOSIS — K219 Gastro-esophageal reflux disease without esophagitis: Secondary | ICD-10-CM | POA: Diagnosis not present

## 2021-11-25 DIAGNOSIS — I442 Atrioventricular block, complete: Secondary | ICD-10-CM | POA: Diagnosis not present

## 2021-11-25 DIAGNOSIS — I480 Paroxysmal atrial fibrillation: Secondary | ICD-10-CM | POA: Diagnosis not present

## 2021-11-25 DIAGNOSIS — I129 Hypertensive chronic kidney disease with stage 1 through stage 4 chronic kidney disease, or unspecified chronic kidney disease: Secondary | ICD-10-CM | POA: Diagnosis not present

## 2021-11-25 DIAGNOSIS — S32501D Unspecified fracture of right pubis, subsequent encounter for fracture with routine healing: Secondary | ICD-10-CM | POA: Diagnosis not present

## 2021-11-25 DIAGNOSIS — G8929 Other chronic pain: Secondary | ICD-10-CM | POA: Diagnosis not present

## 2021-11-25 DIAGNOSIS — E782 Mixed hyperlipidemia: Secondary | ICD-10-CM | POA: Diagnosis not present

## 2021-11-25 DIAGNOSIS — J309 Allergic rhinitis, unspecified: Secondary | ICD-10-CM | POA: Diagnosis not present

## 2021-11-25 DIAGNOSIS — F39 Unspecified mood [affective] disorder: Secondary | ICD-10-CM | POA: Diagnosis not present

## 2021-11-25 DIAGNOSIS — Z950 Presence of cardiac pacemaker: Secondary | ICD-10-CM | POA: Diagnosis not present

## 2021-11-25 DIAGNOSIS — E039 Hypothyroidism, unspecified: Secondary | ICD-10-CM | POA: Diagnosis not present

## 2021-11-25 DIAGNOSIS — E559 Vitamin D deficiency, unspecified: Secondary | ICD-10-CM | POA: Diagnosis not present

## 2021-11-25 DIAGNOSIS — Z8701 Personal history of pneumonia (recurrent): Secondary | ICD-10-CM | POA: Diagnosis not present

## 2021-11-25 DIAGNOSIS — F419 Anxiety disorder, unspecified: Secondary | ICD-10-CM | POA: Diagnosis not present

## 2021-11-25 DIAGNOSIS — M8580 Other specified disorders of bone density and structure, unspecified site: Secondary | ICD-10-CM | POA: Diagnosis not present

## 2021-11-25 DIAGNOSIS — N1832 Chronic kidney disease, stage 3b: Secondary | ICD-10-CM | POA: Diagnosis not present

## 2021-11-25 DIAGNOSIS — M549 Dorsalgia, unspecified: Secondary | ICD-10-CM | POA: Diagnosis not present

## 2021-11-25 DIAGNOSIS — E871 Hypo-osmolality and hyponatremia: Secondary | ICD-10-CM | POA: Diagnosis not present

## 2021-11-25 DIAGNOSIS — D631 Anemia in chronic kidney disease: Secondary | ICD-10-CM | POA: Diagnosis not present

## 2021-11-25 DIAGNOSIS — Z9049 Acquired absence of other specified parts of digestive tract: Secondary | ICD-10-CM | POA: Diagnosis not present

## 2021-11-26 DIAGNOSIS — M7061 Trochanteric bursitis, right hip: Secondary | ICD-10-CM | POA: Diagnosis not present

## 2021-11-26 DIAGNOSIS — S32591D Other specified fracture of right pubis, subsequent encounter for fracture with routine healing: Secondary | ICD-10-CM | POA: Diagnosis not present

## 2021-11-27 DIAGNOSIS — E871 Hypo-osmolality and hyponatremia: Secondary | ICD-10-CM | POA: Diagnosis not present

## 2021-11-27 DIAGNOSIS — S32501D Unspecified fracture of right pubis, subsequent encounter for fracture with routine healing: Secondary | ICD-10-CM | POA: Diagnosis not present

## 2021-11-27 DIAGNOSIS — N1832 Chronic kidney disease, stage 3b: Secondary | ICD-10-CM | POA: Diagnosis not present

## 2021-11-27 DIAGNOSIS — I129 Hypertensive chronic kidney disease with stage 1 through stage 4 chronic kidney disease, or unspecified chronic kidney disease: Secondary | ICD-10-CM | POA: Diagnosis not present

## 2021-11-27 DIAGNOSIS — E782 Mixed hyperlipidemia: Secondary | ICD-10-CM | POA: Diagnosis not present

## 2021-11-27 DIAGNOSIS — D631 Anemia in chronic kidney disease: Secondary | ICD-10-CM | POA: Diagnosis not present

## 2021-12-01 DIAGNOSIS — D631 Anemia in chronic kidney disease: Secondary | ICD-10-CM | POA: Diagnosis not present

## 2021-12-01 DIAGNOSIS — E871 Hypo-osmolality and hyponatremia: Secondary | ICD-10-CM | POA: Diagnosis not present

## 2021-12-01 DIAGNOSIS — I129 Hypertensive chronic kidney disease with stage 1 through stage 4 chronic kidney disease, or unspecified chronic kidney disease: Secondary | ICD-10-CM | POA: Diagnosis not present

## 2021-12-01 DIAGNOSIS — S32501D Unspecified fracture of right pubis, subsequent encounter for fracture with routine healing: Secondary | ICD-10-CM | POA: Diagnosis not present

## 2021-12-01 DIAGNOSIS — N1832 Chronic kidney disease, stage 3b: Secondary | ICD-10-CM | POA: Diagnosis not present

## 2021-12-01 DIAGNOSIS — E782 Mixed hyperlipidemia: Secondary | ICD-10-CM | POA: Diagnosis not present

## 2021-12-03 DIAGNOSIS — D631 Anemia in chronic kidney disease: Secondary | ICD-10-CM | POA: Diagnosis not present

## 2021-12-03 DIAGNOSIS — S32501D Unspecified fracture of right pubis, subsequent encounter for fracture with routine healing: Secondary | ICD-10-CM | POA: Diagnosis not present

## 2021-12-03 DIAGNOSIS — N1832 Chronic kidney disease, stage 3b: Secondary | ICD-10-CM | POA: Diagnosis not present

## 2021-12-03 DIAGNOSIS — E871 Hypo-osmolality and hyponatremia: Secondary | ICD-10-CM | POA: Diagnosis not present

## 2021-12-03 DIAGNOSIS — E782 Mixed hyperlipidemia: Secondary | ICD-10-CM | POA: Diagnosis not present

## 2021-12-03 DIAGNOSIS — I129 Hypertensive chronic kidney disease with stage 1 through stage 4 chronic kidney disease, or unspecified chronic kidney disease: Secondary | ICD-10-CM | POA: Diagnosis not present

## 2021-12-04 DIAGNOSIS — E871 Hypo-osmolality and hyponatremia: Secondary | ICD-10-CM | POA: Diagnosis not present

## 2021-12-04 DIAGNOSIS — I129 Hypertensive chronic kidney disease with stage 1 through stage 4 chronic kidney disease, or unspecified chronic kidney disease: Secondary | ICD-10-CM | POA: Diagnosis not present

## 2021-12-04 DIAGNOSIS — D631 Anemia in chronic kidney disease: Secondary | ICD-10-CM | POA: Diagnosis not present

## 2021-12-04 DIAGNOSIS — S32501D Unspecified fracture of right pubis, subsequent encounter for fracture with routine healing: Secondary | ICD-10-CM | POA: Diagnosis not present

## 2021-12-04 DIAGNOSIS — N1832 Chronic kidney disease, stage 3b: Secondary | ICD-10-CM | POA: Diagnosis not present

## 2021-12-04 DIAGNOSIS — E782 Mixed hyperlipidemia: Secondary | ICD-10-CM | POA: Diagnosis not present

## 2021-12-07 DIAGNOSIS — E782 Mixed hyperlipidemia: Secondary | ICD-10-CM | POA: Diagnosis not present

## 2021-12-07 DIAGNOSIS — N1832 Chronic kidney disease, stage 3b: Secondary | ICD-10-CM | POA: Diagnosis not present

## 2021-12-07 DIAGNOSIS — E871 Hypo-osmolality and hyponatremia: Secondary | ICD-10-CM | POA: Diagnosis not present

## 2021-12-07 DIAGNOSIS — I129 Hypertensive chronic kidney disease with stage 1 through stage 4 chronic kidney disease, or unspecified chronic kidney disease: Secondary | ICD-10-CM | POA: Diagnosis not present

## 2021-12-07 DIAGNOSIS — S32501D Unspecified fracture of right pubis, subsequent encounter for fracture with routine healing: Secondary | ICD-10-CM | POA: Diagnosis not present

## 2021-12-07 DIAGNOSIS — D631 Anemia in chronic kidney disease: Secondary | ICD-10-CM | POA: Diagnosis not present

## 2021-12-09 DIAGNOSIS — E871 Hypo-osmolality and hyponatremia: Secondary | ICD-10-CM | POA: Diagnosis not present

## 2021-12-09 DIAGNOSIS — E782 Mixed hyperlipidemia: Secondary | ICD-10-CM | POA: Diagnosis not present

## 2021-12-09 DIAGNOSIS — I129 Hypertensive chronic kidney disease with stage 1 through stage 4 chronic kidney disease, or unspecified chronic kidney disease: Secondary | ICD-10-CM | POA: Diagnosis not present

## 2021-12-09 DIAGNOSIS — D631 Anemia in chronic kidney disease: Secondary | ICD-10-CM | POA: Diagnosis not present

## 2021-12-09 DIAGNOSIS — S32501D Unspecified fracture of right pubis, subsequent encounter for fracture with routine healing: Secondary | ICD-10-CM | POA: Diagnosis not present

## 2021-12-09 DIAGNOSIS — N1832 Chronic kidney disease, stage 3b: Secondary | ICD-10-CM | POA: Diagnosis not present

## 2021-12-16 ENCOUNTER — Telehealth: Payer: Self-pay | Admitting: Family Medicine

## 2021-12-16 DIAGNOSIS — E871 Hypo-osmolality and hyponatremia: Secondary | ICD-10-CM | POA: Diagnosis not present

## 2021-12-16 DIAGNOSIS — D631 Anemia in chronic kidney disease: Secondary | ICD-10-CM | POA: Diagnosis not present

## 2021-12-16 DIAGNOSIS — S32501D Unspecified fracture of right pubis, subsequent encounter for fracture with routine healing: Secondary | ICD-10-CM | POA: Diagnosis not present

## 2021-12-16 DIAGNOSIS — N1832 Chronic kidney disease, stage 3b: Secondary | ICD-10-CM | POA: Diagnosis not present

## 2021-12-16 DIAGNOSIS — I129 Hypertensive chronic kidney disease with stage 1 through stage 4 chronic kidney disease, or unspecified chronic kidney disease: Secondary | ICD-10-CM | POA: Diagnosis not present

## 2021-12-16 DIAGNOSIS — E782 Mixed hyperlipidemia: Secondary | ICD-10-CM | POA: Diagnosis not present

## 2021-12-16 NOTE — Telephone Encounter (Signed)
..  Home Health Certification or Plan of Care Tracking  Is this a Certification or Plan of Care? Yes  [place] Agency: [place] Number:  [id number]  Has charge sheet been attached? yes  Where has form been placed:  In provider's box  Faxed to:   [phone]

## 2021-12-17 DIAGNOSIS — E871 Hypo-osmolality and hyponatremia: Secondary | ICD-10-CM | POA: Diagnosis not present

## 2021-12-17 DIAGNOSIS — D631 Anemia in chronic kidney disease: Secondary | ICD-10-CM | POA: Diagnosis not present

## 2021-12-17 DIAGNOSIS — N1832 Chronic kidney disease, stage 3b: Secondary | ICD-10-CM | POA: Diagnosis not present

## 2021-12-17 DIAGNOSIS — I129 Hypertensive chronic kidney disease with stage 1 through stage 4 chronic kidney disease, or unspecified chronic kidney disease: Secondary | ICD-10-CM | POA: Diagnosis not present

## 2021-12-17 DIAGNOSIS — E782 Mixed hyperlipidemia: Secondary | ICD-10-CM | POA: Diagnosis not present

## 2021-12-17 DIAGNOSIS — S32501D Unspecified fracture of right pubis, subsequent encounter for fracture with routine healing: Secondary | ICD-10-CM | POA: Diagnosis not present

## 2021-12-23 DIAGNOSIS — M47896 Other spondylosis, lumbar region: Secondary | ICD-10-CM | POA: Diagnosis not present

## 2021-12-23 DIAGNOSIS — I471 Supraventricular tachycardia: Secondary | ICD-10-CM | POA: Diagnosis not present

## 2021-12-23 DIAGNOSIS — Z950 Presence of cardiac pacemaker: Secondary | ICD-10-CM | POA: Diagnosis not present

## 2021-12-23 DIAGNOSIS — S32591D Other specified fracture of right pubis, subsequent encounter for fracture with routine healing: Secondary | ICD-10-CM | POA: Diagnosis not present

## 2021-12-23 DIAGNOSIS — R600 Localized edema: Secondary | ICD-10-CM | POA: Diagnosis not present

## 2021-12-25 DIAGNOSIS — G8929 Other chronic pain: Secondary | ICD-10-CM | POA: Diagnosis not present

## 2021-12-25 DIAGNOSIS — K219 Gastro-esophageal reflux disease without esophagitis: Secondary | ICD-10-CM | POA: Diagnosis not present

## 2021-12-25 DIAGNOSIS — I129 Hypertensive chronic kidney disease with stage 1 through stage 4 chronic kidney disease, or unspecified chronic kidney disease: Secondary | ICD-10-CM | POA: Diagnosis not present

## 2021-12-25 DIAGNOSIS — M8580 Other specified disorders of bone density and structure, unspecified site: Secondary | ICD-10-CM | POA: Diagnosis not present

## 2021-12-25 DIAGNOSIS — I480 Paroxysmal atrial fibrillation: Secondary | ICD-10-CM | POA: Diagnosis not present

## 2021-12-25 DIAGNOSIS — Z9049 Acquired absence of other specified parts of digestive tract: Secondary | ICD-10-CM | POA: Diagnosis not present

## 2021-12-25 DIAGNOSIS — E039 Hypothyroidism, unspecified: Secondary | ICD-10-CM | POA: Diagnosis not present

## 2021-12-25 DIAGNOSIS — I442 Atrioventricular block, complete: Secondary | ICD-10-CM | POA: Diagnosis not present

## 2021-12-25 DIAGNOSIS — Z7982 Long term (current) use of aspirin: Secondary | ICD-10-CM | POA: Diagnosis not present

## 2021-12-25 DIAGNOSIS — I495 Sick sinus syndrome: Secondary | ICD-10-CM | POA: Diagnosis not present

## 2021-12-25 DIAGNOSIS — E871 Hypo-osmolality and hyponatremia: Secondary | ICD-10-CM | POA: Diagnosis not present

## 2021-12-25 DIAGNOSIS — Z8701 Personal history of pneumonia (recurrent): Secondary | ICD-10-CM | POA: Diagnosis not present

## 2021-12-25 DIAGNOSIS — N1832 Chronic kidney disease, stage 3b: Secondary | ICD-10-CM | POA: Diagnosis not present

## 2021-12-25 DIAGNOSIS — Z7901 Long term (current) use of anticoagulants: Secondary | ICD-10-CM | POA: Diagnosis not present

## 2021-12-25 DIAGNOSIS — F419 Anxiety disorder, unspecified: Secondary | ICD-10-CM | POA: Diagnosis not present

## 2021-12-25 DIAGNOSIS — F5101 Primary insomnia: Secondary | ICD-10-CM | POA: Diagnosis not present

## 2021-12-25 DIAGNOSIS — E782 Mixed hyperlipidemia: Secondary | ICD-10-CM | POA: Diagnosis not present

## 2021-12-25 DIAGNOSIS — M549 Dorsalgia, unspecified: Secondary | ICD-10-CM | POA: Diagnosis not present

## 2021-12-25 DIAGNOSIS — E876 Hypokalemia: Secondary | ICD-10-CM | POA: Diagnosis not present

## 2021-12-25 DIAGNOSIS — D631 Anemia in chronic kidney disease: Secondary | ICD-10-CM | POA: Diagnosis not present

## 2021-12-25 DIAGNOSIS — F39 Unspecified mood [affective] disorder: Secondary | ICD-10-CM | POA: Diagnosis not present

## 2021-12-25 DIAGNOSIS — S32501D Unspecified fracture of right pubis, subsequent encounter for fracture with routine healing: Secondary | ICD-10-CM | POA: Diagnosis not present

## 2021-12-25 DIAGNOSIS — J309 Allergic rhinitis, unspecified: Secondary | ICD-10-CM | POA: Diagnosis not present

## 2021-12-25 DIAGNOSIS — Z950 Presence of cardiac pacemaker: Secondary | ICD-10-CM | POA: Diagnosis not present

## 2021-12-25 DIAGNOSIS — E559 Vitamin D deficiency, unspecified: Secondary | ICD-10-CM | POA: Diagnosis not present

## 2022-01-07 ENCOUNTER — Telehealth: Payer: Self-pay | Admitting: Family Medicine

## 2022-01-07 DIAGNOSIS — I129 Hypertensive chronic kidney disease with stage 1 through stage 4 chronic kidney disease, or unspecified chronic kidney disease: Secondary | ICD-10-CM | POA: Diagnosis not present

## 2022-01-07 DIAGNOSIS — D631 Anemia in chronic kidney disease: Secondary | ICD-10-CM | POA: Diagnosis not present

## 2022-01-07 DIAGNOSIS — N1832 Chronic kidney disease, stage 3b: Secondary | ICD-10-CM | POA: Diagnosis not present

## 2022-01-07 DIAGNOSIS — E871 Hypo-osmolality and hyponatremia: Secondary | ICD-10-CM | POA: Diagnosis not present

## 2022-01-07 DIAGNOSIS — E782 Mixed hyperlipidemia: Secondary | ICD-10-CM | POA: Diagnosis not present

## 2022-01-07 DIAGNOSIS — S32501D Unspecified fracture of right pubis, subsequent encounter for fracture with routine healing: Secondary | ICD-10-CM | POA: Diagnosis not present

## 2022-01-07 NOTE — Telephone Encounter (Signed)
Caller states  -saw pt today (07/20) -pt complained back pain, caller inspected the area. -pt has hematoma on upper, right aspect of thoracic.  -pt disclosed to caller a fall on 07/11 and a second fall, but pt does not know date. -pt has soreness in the ribs

## 2022-01-08 ENCOUNTER — Ambulatory Visit: Payer: Medicare Other | Admitting: Family

## 2022-01-13 DIAGNOSIS — I129 Hypertensive chronic kidney disease with stage 1 through stage 4 chronic kidney disease, or unspecified chronic kidney disease: Secondary | ICD-10-CM | POA: Diagnosis not present

## 2022-01-13 DIAGNOSIS — E782 Mixed hyperlipidemia: Secondary | ICD-10-CM | POA: Diagnosis not present

## 2022-01-13 DIAGNOSIS — S32501D Unspecified fracture of right pubis, subsequent encounter for fracture with routine healing: Secondary | ICD-10-CM | POA: Diagnosis not present

## 2022-01-13 DIAGNOSIS — D631 Anemia in chronic kidney disease: Secondary | ICD-10-CM | POA: Diagnosis not present

## 2022-01-13 DIAGNOSIS — E871 Hypo-osmolality and hyponatremia: Secondary | ICD-10-CM | POA: Diagnosis not present

## 2022-01-13 DIAGNOSIS — N1832 Chronic kidney disease, stage 3b: Secondary | ICD-10-CM | POA: Diagnosis not present

## 2022-01-16 DIAGNOSIS — M47816 Spondylosis without myelopathy or radiculopathy, lumbar region: Secondary | ICD-10-CM | POA: Diagnosis not present

## 2022-01-25 DIAGNOSIS — Z9189 Other specified personal risk factors, not elsewhere classified: Secondary | ICD-10-CM | POA: Diagnosis not present

## 2022-01-25 DIAGNOSIS — M5416 Radiculopathy, lumbar region: Secondary | ICD-10-CM | POA: Diagnosis not present

## 2022-01-26 ENCOUNTER — Ambulatory Visit (INDEPENDENT_AMBULATORY_CARE_PROVIDER_SITE_OTHER): Payer: Medicare Other | Admitting: Podiatry

## 2022-01-26 ENCOUNTER — Encounter: Payer: Self-pay | Admitting: Podiatry

## 2022-01-26 DIAGNOSIS — M79674 Pain in right toe(s): Secondary | ICD-10-CM | POA: Diagnosis not present

## 2022-01-26 DIAGNOSIS — B351 Tinea unguium: Secondary | ICD-10-CM | POA: Diagnosis not present

## 2022-01-26 DIAGNOSIS — M79675 Pain in left toe(s): Secondary | ICD-10-CM

## 2022-01-26 NOTE — Progress Notes (Signed)
  Subjective:  Patient ID: [name], female    DOB: [birth year],  MRN: [id number]  Alazay Leicht presents to clinic today for at risk foot care with h/o CKD and painful thick toenails that are difficult to trim. Pain interferes with ambulation. Aggravating factors include wearing enclosed shoe gear. Pain is relieved with periodic professional debridement.  Patient  is accompanied by her son on today's visit.  New problem(s): None.   PCP is Leamon Arnt, MD , and last visit was  Oct 28, 2021  Allergies  Allergen Reactions   Fluticasone-Salmeterol Other (See Comments)    unknown Other reaction(s): tongue/throat symptoms   Sulfa Antibiotics     Other reaction(s): gastric intol   Sulfonamide Derivatives Other (See Comments)    unknown   Tetracycline Hcl    Benzonatate Other (See Comments)    tongue/throat symptoms in the past but tolerated more recently Other reaction(s): and tongue/throat symptoms in the past (not more recently)   Erythromycin Nausea And Vomiting and Other (See Comments)    Stomach cramps Other reaction(s): gastric intol   Meloxicam Other (See Comments)    Mild dyspnea Other reaction(s): dyspnea   Sulfamethoxazole Other (See Comments)    Gastric intolerance    Review of Systems: Negative except as noted in the HPI.  Objective: No changes noted in today's physical examination. Ms. Cockerham is a pleasant 88 y.o. female in NAD. AAO x 3.   Vascular Examination: Vascular status intact b/l with palpable pedal pulses. CFT immediate b/l. No edema. No pain with calf compression b/l. Skin temperature gradient WNL b/l. Pedal hair sparse.  Neurological Examination: Sensation grossly intact b/l with 10 gram monofilament. Vibratory sensation intact b/l.   Dermatological Examination: Pedal skin with normal turgor, texture and tone b/l. Toenails 1-5 b/l thick, discolored, elongated with subungual debris and pain on dorsal palpation. No hyperkeratotic lesions noted b/l.    Musculoskeletal Examination: Muscle strength 5/5 to b/l LE. No pain, crepitus or joint limitation noted with ROM bilateral LE. Patient utilizes walker for ambulation assistance.  Radiographs: None    Latest Ref Rng & Units 04/27/2021   10:59 AM  Hemoglobin A1C  Hemoglobin-A1c 4.0 - 5.6 % 5.4    Assessment/Plan: 1. Pain due to onychomycosis of toenails of both feet   -Examined patient. -No new findings. No new orders. -Mycotic toenails 1-5 bilaterally were debrided in length and girth with sterile nail nippers and dremel without incident. -Patient/POA to call should there be question/concern in the interim.   Return in about 3 months (around 04/28/2022).  Marzetta Board, DPM

## 2022-02-01 ENCOUNTER — Telehealth: Payer: Self-pay | Admitting: Cardiovascular Disease

## 2022-02-01 NOTE — Telephone Encounter (Signed)
Pt would like a callback regarding monitor. Please advise. 

## 2022-02-02 ENCOUNTER — Telehealth: Payer: Self-pay | Admitting: Family Medicine

## 2022-02-02 NOTE — Telephone Encounter (Signed)
LMOVM for patient to call the device clinic back.

## 2022-02-02 NOTE — Telephone Encounter (Signed)
[facility] [id number] printed in the office, place in provider's box with green charge sheet.  Pt requests form to be mailed to her once completed by PCP team.  Please call with any questions.

## 2022-02-02 NOTE — Telephone Encounter (Signed)
Patient returned call, she would like a call back tomorrow.

## 2022-02-03 ENCOUNTER — Telehealth: Payer: Self-pay

## 2022-02-03 NOTE — Telephone Encounter (Signed)
DMV forms have sent back thru the mail

## 2022-02-08 ENCOUNTER — Other Ambulatory Visit: Payer: Self-pay | Admitting: Family Medicine

## 2022-02-08 NOTE — Telephone Encounter (Signed)
I spoke with the patient. Her monitor hooks up to a phone line however, she no longer has a phone line. I ordered the patient a cell adapter and she should receive it in 7-10 business days by fedex.

## 2022-02-09 DIAGNOSIS — M5416 Radiculopathy, lumbar region: Secondary | ICD-10-CM | POA: Diagnosis not present

## 2022-02-17 DIAGNOSIS — H26491 Other secondary cataract, right eye: Secondary | ICD-10-CM | POA: Diagnosis not present

## 2022-02-17 DIAGNOSIS — H04123 Dry eye syndrome of bilateral lacrimal glands: Secondary | ICD-10-CM | POA: Diagnosis not present

## 2022-02-17 DIAGNOSIS — H10413 Chronic giant papillary conjunctivitis, bilateral: Secondary | ICD-10-CM | POA: Diagnosis not present

## 2022-02-17 DIAGNOSIS — H04203 Unspecified epiphora, bilateral lacrimal glands: Secondary | ICD-10-CM | POA: Diagnosis not present

## 2022-02-23 ENCOUNTER — Other Ambulatory Visit: Payer: Self-pay | Admitting: Family Medicine

## 2022-03-02 ENCOUNTER — Other Ambulatory Visit: Payer: Self-pay | Admitting: Family Medicine

## 2022-03-03 ENCOUNTER — Encounter: Payer: Self-pay | Admitting: Family Medicine

## 2022-03-03 ENCOUNTER — Ambulatory Visit (INDEPENDENT_AMBULATORY_CARE_PROVIDER_SITE_OTHER): Payer: Medicare Other | Admitting: Family Medicine

## 2022-03-03 VITALS — BP 124/50 | HR 97 | Temp 97.7°F | Ht 59.0 in | Wt 145.6 lb

## 2022-03-03 DIAGNOSIS — R600 Localized edema: Secondary | ICD-10-CM | POA: Diagnosis not present

## 2022-03-03 DIAGNOSIS — I5032 Chronic diastolic (congestive) heart failure: Secondary | ICD-10-CM | POA: Diagnosis not present

## 2022-03-03 DIAGNOSIS — Z23 Encounter for immunization: Secondary | ICD-10-CM | POA: Diagnosis not present

## 2022-03-03 DIAGNOSIS — N1831 Chronic kidney disease, stage 3a: Secondary | ICD-10-CM | POA: Diagnosis not present

## 2022-03-03 NOTE — Patient Instructions (Signed)
Please return in 1 week if needed.   Increase the lasix as we discussed and monitor your weights daily.  Wrap the leg with gauze/bandage and ace wrap and elevated for the next few days. Let me know if they become red.   I will release your lab results to you on your MyChart account with further instructions. You may see the results before I do, but when I review them I will send you a message with my report or have my assistant call you if things need to be discussed. Please reply to my message with any questions. Thank you!   If you have any questions or concerns, please don't hesitate to send me a message via MyChart or call the office at [phone]. Thank you for visiting with [place] today! It's our pleasure caring for you.

## 2022-03-03 NOTE — Progress Notes (Signed)
Subjective  CC:  Chief Complaint  Patient presents with   Leg Swelling    Pt stated that her Rt leg as been swollen for the past week and is weeping, hands are cold, and chap lips along with congestion    HPI: [name] is a 88 y.o. female who presents to the office today to address the problems listed above in the chief complaint. 88 year old female with chronic lower extremity edema, multifactorial with chronic diastolic heart failure, stage III kidney disease and possible venous insufficiency presents due to clear drainage from her right leg.  No redness.  Chronic edema is mostly unchanged.  No change in diet, sodium intake, walking.  No shortness of breath palpitations or chest pain.  Overall feels well.  No redness in the lower extremity.  No calf pain. Does complain of dry lips that feel like rubber.  No new medications. Assessment  1. Lower extremity edema   2. Chronic diastolic heart failure (HCC)   3. Stage 3a chronic kidney disease (CKD) ([place])   4. Need for immunization against influenza      Plan  Lower extremity edema with weeping: Weight is stable.  No clear reason for increase in swelling but will increase Lasix to twice daily for the next 3 days.  Monitor daily weights.  Continue potassium supplements with extra doses.  Check CMP today.  Check thyroid.  Elevate legs.  Legs are wrapped and bandaged today.  No sign of inflammation or infection.  Recheck next week if not improving. Flu shot updated today. Dry lips: Conservative care discussed  Follow up: 1 week if not improving 04/30/2022  Orders Placed This Encounter  Procedures   Flu Vaccine QUAD High Dose(Fluad)   Comprehensive metabolic panel   TSH   No orders of the defined types were placed in this encounter.     I reviewed the patients updated PMH, FH, and SocHx.    Patient Active Problem List   Diagnosis Date Noted   Chronic diastolic heart failure ([place]) 03/03/2022    Priority: High   Stage 3a  chronic kidney disease (CKD) ([place]) 04/27/2021    Priority: High   SSS (sick sinus syndrome) (HCC) 06/15/2017    Priority: High   PAT (paroxysmal atrial tachycardia) (HCC) 06/15/2017    Priority: High   Impaired fasting glucose 07/27/2016    Priority: High   CHB (complete heart block) ([place]) s/p pacemaker 05/17/2013    Priority: High   Pacemaker 02/04/2013    Priority: High   Essential hypertension 02/04/2013    Priority: High   Mixed hyperlipidemia 02/04/2013    Priority: High   Osteopenia 04/27/2021    Priority: Medium    Adjustment disorder with anxiety 04/27/2021    Priority: Medium    Early stage nonexudative age-related macular degeneration of right eye 03/06/2020    Priority: Medium    Early stage nonexudative age-related macular degeneration of left eye 03/06/2020    Priority: Medium    Laryngopharyngeal reflux (LPR) 03/11/2017    Priority: Medium    Primary hypothyroidism 07/27/2016    Priority: Medium    Lower extremity edema 11/22/2014    Priority: Medium    Asthma 02/20/2010    Priority: Medium    GERD (gastroesophageal reflux disease) 10/26/2007    Priority: Medium    Chronic cough 10/26/2007    Priority: Medium    Choroidal nevus of right eye 03/06/2020    Priority: Low   Pseudophakia 03/06/2020    Priority:  Low   Vitamin D deficiency 07/27/2016    Priority: Low   Allergic rhinitis due to pollen 08/21/2010    Priority: Low   Primary insomnia 10/28/2021   Normocytic anemia 07/14/2021   Current Meds  Medication Sig   Acetaminophen (TYLENOL) 325 MG CAPS Tylenol   acetaminophen (TYLENOL) 500 MG tablet 1000 mg by oral route.   Ascorbic Acid (VITAMIN C) 100 MG tablet 500 mg daily.   aspirin 81 MG chewable tablet Chew 81 mg by mouth at bedtime.   calcium carbonate (TUMS EX) 750 MG chewable tablet Chew 1 tablet by mouth daily as needed for heartburn.   cetirizine (ZYRTEC) 10 MG tablet    cholecalciferol (VITAMIN D3) 25 MCG (1000 UNIT) tablet Take 1 tablet  (1,000 Units total) by mouth daily.   conjugated estrogens (PREMARIN) vaginal cream    cromolyn (OPTICROM) 4 % ophthalmic solution INSTILL 1 DROP INTO BOTH EYES 4 TIMES A DAY   diltiazem (CARDIZEM CD) 120 MG 24 hr capsule Take 1 capsule (120 mg total) by mouth at bedtime.   diltiazem (CARDIZEM CD) 180 MG 24 hr capsule Take by mouth.   docusate sodium (COLACE) 100 MG capsule Take 1 capsule (100 mg total) by mouth 2 (two) times daily.   furosemide (LASIX) 40 MG tablet Take 1 tablet (40 mg total) by mouth daily. May take one extra tablet daily as needed for edema.   lidocaine (LIDODERM) 5 %    LORazepam (ATIVAN) 0.5 MG tablet TAKE 1 TABLET BY MOUTH EVERY DAY AS NEEDED FOR ANXIETY   melatonin 3 MG TABS tablet 3 mg by oral route.   methocarbamol (ROBAXIN) 500 MG tablet Take 1 tablet (500 mg total) by mouth every 8 (eight) hours as needed for muscle spasms.   metoprolol succinate (TOPROL-XL) 25 MG 24 hr tablet Take 1 tablet (25 mg total) by mouth at bedtime.   pravastatin (PRAVACHOL) 40 MG tablet TAKE 1 TABLET BY MOUTH EVERY DAY   tiZANidine (ZANAFLEX) 2 MG tablet tizanidine 2 mg tablet  Take 1 tablet 3 times a day by oral route as needed.   traMADol (ULTRAM) 50 MG tablet Take 1 tablet twice a day by oral route as needed.   traZODone (DESYREL) 50 MG tablet Take 0.5-1 tablets (25-50 mg total) by mouth at bedtime as needed for sleep.   valsartan-hydrochlorothiazide (DIOVAN-HCT) 160-25 MG tablet Take 1 tablet by mouth daily.    Allergies: Patient is allergic to fluticasone-salmeterol, sulfa antibiotics, sulfonamide derivatives, tetracycline hcl, benzonatate, erythromycin, meloxicam, and sulfamethoxazole. Family History: Patient family history includes Cancer in her father; Stroke in her mother and sister. Social History:  Patient  reports that she has never smoked. She has never used smokeless tobacco. She reports that she does not drink alcohol and does not use drugs.  Review of  Systems: Constitutional: Negative for fever malaise or anorexia Cardiovascular: negative for chest pain Respiratory: negative for SOB or persistent cough Gastrointestinal: negative for abdominal pain  Objective  Vitals: BP (!) 124/50   Pulse 97   Temp 97.7 F (36.5 C)   Ht '4\' 11"'$  (1.499 m)   Wt 145 lb 9.6 oz (66 kg)   SpO2 96%   BMI 29.41 kg/m  General: no acute distress , A&Ox3, appears well HEENT: PEERL, conjunctiva normal, neck is supple Cardiovascular:  RRR  Respiratory:  Good breath sounds bilaterally, CTAB with normal respiratory effort, no edema Skin:  Warm, no rashes Bilateral lower extremity +1 pitting edema with clear weeping the right anterior  shin.  No erythema or warmth.    Commons side effects, risks, benefits, and alternatives for medications and treatment plan prescribed today were discussed, and the patient expressed understanding of the given instructions. Patient is instructed to call or message via MyChart if he/she has any questions or concerns regarding our treatment plan. No barriers to understanding were identified. We discussed Red Flag symptoms and signs in detail. Patient expressed understanding regarding what to do in case of urgent or emergency type symptoms.  Medication list was reconciled, printed and provided to the patient in AVS. Patient instructions and summary information was reviewed with the patient as documented in the AVS. This note was prepared with assistance of Dragon voice recognition software. Occasional wrong-word or sound-a-like substitutions may have occurred due to the inherent limitations of voice recognition software  This visit occurred during the SARS-CoV-2 public health emergency.  Safety protocols were in place, including screening questions prior to the visit, additional usage of staff PPE, and extensive cleaning of exam room while observing appropriate contact time as indicated for disinfecting solutions.

## 2022-03-04 LAB — COMPREHENSIVE METABOLIC PANEL
ALT: 10 U/L (ref 0–35)
AST: 16 U/L (ref 0–37)
Albumin: 3.7 g/dL (ref 3.5–5.2)
Alkaline Phosphatase: 64 U/L (ref 39–117)
BUN: 30 mg/dL — ABNORMAL HIGH (ref 6–23)
CO2: 31 mEq/L (ref 19–32)
Calcium: 9.6 mg/dL (ref 8.4–10.5)
Chloride: 99 mEq/L (ref 96–112)
Creatinine, Ser: 1.25 mg/dL — ABNORMAL HIGH (ref 0.40–1.20)
GFR: 38.52 mL/min — ABNORMAL LOW (ref >60.00)
Glucose, Bld: 114 mg/dL — ABNORMAL HIGH (ref 70–99)
Potassium: 4 mEq/L (ref 3.5–5.1)
Sodium: 140 mEq/L (ref 135–145)
Total Bilirubin: 0.4 mg/dL (ref 0.2–1.2)
Total Protein: 7.2 g/dL (ref 6.0–8.3)

## 2022-03-04 LAB — TSH: TSH: 7.66 u[IU]/mL — ABNORMAL HIGH (ref 0.35–5.50)

## 2022-03-08 NOTE — Progress Notes (Signed)
Please call patient: I have reviewed his/her lab results. Labs show that her thyroid is off a little; please reorder (restart) levothyroxine 40mg daily. Will recheck in 6-8 weeks at our office visit (enure it is scheduled).  Other labs are stable.   04/30/2022

## 2022-03-09 ENCOUNTER — Encounter (INDEPENDENT_AMBULATORY_CARE_PROVIDER_SITE_OTHER): Payer: Medicare Other | Admitting: Ophthalmology

## 2022-03-11 ENCOUNTER — Other Ambulatory Visit: Payer: Self-pay

## 2022-03-11 DIAGNOSIS — E039 Hypothyroidism, unspecified: Secondary | ICD-10-CM

## 2022-03-11 MED ORDER — LEVOTHYROXINE SODIUM 25 MCG PO TABS: 25.0000 ug | ORAL_TABLET | Freq: Every day | ORAL | 3 refills | Status: DC

## 2022-04-29 ENCOUNTER — Telehealth: Payer: Self-pay | Admitting: Family Medicine

## 2022-04-29 ENCOUNTER — Other Ambulatory Visit: Payer: Self-pay | Admitting: Family Medicine

## 2022-04-29 NOTE — Telephone Encounter (Signed)
   LAST APPOINTMENT DATE:  Please schedule appointment if longer than 1 year  NEXT APPOINTMENT DATE:  04/30/22  MEDICATION: pantoprazole (PROTONIX) 40 MG tablet (Expired)   Is the patient out of medication? Yes  PHARMACY:  [facility], [place] Phone: [phone]  Fax: [phone]     Let patient know to contact pharmacy at the end of the day to make sure medication is ready.  Please notify patient to allow 48-72 hours to process

## 2022-04-30 ENCOUNTER — Encounter: Payer: Self-pay | Admitting: Family Medicine

## 2022-04-30 ENCOUNTER — Ambulatory Visit (INDEPENDENT_AMBULATORY_CARE_PROVIDER_SITE_OTHER): Payer: Medicare Other | Admitting: Family Medicine

## 2022-04-30 VITALS — BP 136/64 | HR 72 | Temp 98.2°F | Ht 59.0 in | Wt 146.4 lb

## 2022-04-30 DIAGNOSIS — Z950 Presence of cardiac pacemaker: Secondary | ICD-10-CM | POA: Diagnosis not present

## 2022-04-30 DIAGNOSIS — I5032 Chronic diastolic (congestive) heart failure: Secondary | ICD-10-CM | POA: Diagnosis not present

## 2022-04-30 DIAGNOSIS — R600 Localized edema: Secondary | ICD-10-CM | POA: Diagnosis not present

## 2022-04-30 DIAGNOSIS — E039 Hypothyroidism, unspecified: Secondary | ICD-10-CM

## 2022-04-30 DIAGNOSIS — R053 Chronic cough: Secondary | ICD-10-CM

## 2022-04-30 DIAGNOSIS — R7301 Impaired fasting glucose: Secondary | ICD-10-CM | POA: Diagnosis not present

## 2022-04-30 DIAGNOSIS — N1831 Chronic kidney disease, stage 3a: Secondary | ICD-10-CM

## 2022-04-30 DIAGNOSIS — I10 Essential (primary) hypertension: Secondary | ICD-10-CM | POA: Diagnosis not present

## 2022-04-30 DIAGNOSIS — E782 Mixed hyperlipidemia: Secondary | ICD-10-CM

## 2022-04-30 DIAGNOSIS — K219 Gastro-esophageal reflux disease without esophagitis: Secondary | ICD-10-CM | POA: Diagnosis not present

## 2022-04-30 DIAGNOSIS — I442 Atrioventricular block, complete: Secondary | ICD-10-CM

## 2022-04-30 DIAGNOSIS — I495 Sick sinus syndrome: Secondary | ICD-10-CM | POA: Diagnosis not present

## 2022-04-30 DIAGNOSIS — I4719 Other supraventricular tachycardia: Secondary | ICD-10-CM | POA: Diagnosis not present

## 2022-04-30 LAB — COMPREHENSIVE METABOLIC PANEL
ALT: 8 U/L (ref 0–35)
AST: 15 U/L (ref 0–37)
Albumin: 3.9 g/dL (ref 3.5–5.2)
Alkaline Phosphatase: 57 U/L (ref 39–117)
BUN: 35 mg/dL — ABNORMAL HIGH (ref 6–23)
CO2: 30 mEq/L (ref 19–32)
Calcium: 9.2 mg/dL (ref 8.4–10.5)
Chloride: 100 mEq/L (ref 96–112)
Creatinine, Ser: 1.21 mg/dL — ABNORMAL HIGH (ref 0.40–1.20)
GFR: 40.01 mL/min — ABNORMAL LOW (ref >60.00)
Glucose, Bld: 102 mg/dL — ABNORMAL HIGH (ref 70–99)
Potassium: 3.8 mEq/L (ref 3.5–5.1)
Sodium: 139 mEq/L (ref 135–145)
Total Bilirubin: 0.3 mg/dL (ref 0.2–1.2)
Total Protein: 7.3 g/dL (ref 6.0–8.3)

## 2022-04-30 LAB — LIPID PANEL
Cholesterol: 134 mg/dL (ref 0–200)
HDL: 54.8 mg/dL (ref >39.00)
LDL Cholesterol: 59 mg/dL (ref 0–99)
NonHDL: 79.11
Total CHOL/HDL Ratio: 2
Triglycerides: 100 mg/dL (ref 0.0–149.0)
VLDL: 20 mg/dL (ref 0.0–40.0)

## 2022-04-30 LAB — CBC WITH DIFFERENTIAL/PLATELET
Basophils Absolute: 0.1 10*3/uL (ref 0.0–0.1)
Basophils Relative: 0.8 % (ref 0.0–3.0)
Eosinophils Absolute: 0.4 10*3/uL (ref 0.0–0.7)
Eosinophils Relative: 4.1 % (ref 0.0–5.0)
HCT: 32.5 % — ABNORMAL LOW (ref 36.0–46.0)
Hemoglobin: 10.9 g/dL — ABNORMAL LOW (ref 12.0–15.0)
Lymphocytes Relative: 24.9 % (ref 12.0–46.0)
Lymphs Abs: 2.4 10*3/uL (ref 0.7–4.0)
MCHC: 33.5 g/dL (ref 30.0–36.0)
MCV: 89.4 fl (ref 78.0–100.0)
Monocytes Absolute: 0.8 10*3/uL (ref 0.1–1.0)
Monocytes Relative: 8.1 % (ref 3.0–12.0)
Neutro Abs: 5.9 10*3/uL (ref 1.4–7.7)
Neutrophils Relative %: 62.1 % (ref 43.0–77.0)
Platelets: 245 10*3/uL (ref 150.0–400.0)
RBC: 3.64 Mil/uL — ABNORMAL LOW (ref 3.87–5.11)
RDW: 12.8 % (ref 11.5–15.5)
WBC: 9.5 10*3/uL (ref 4.0–10.5)

## 2022-04-30 LAB — BRAIN NATRIURETIC PEPTIDE: Pro B Natriuretic peptide (BNP): 86 pg/mL (ref 0.0–100.0)

## 2022-04-30 LAB — HEMOGLOBIN A1C: Hgb A1c MFr Bld: 6.1 % (ref 4.6–6.5)

## 2022-04-30 LAB — TSH: TSH: 5.36 u[IU]/mL (ref 0.35–5.50)

## 2022-04-30 MED ORDER — PANTOPRAZOLE SODIUM 40 MG PO TBEC: 80.0000 mg | DELAYED_RELEASE_TABLET | Freq: Every day | ORAL | 0 refills | Status: DC

## 2022-04-30 MED ORDER — PANTOPRAZOLE SODIUM 20 MG PO TBEC: 20.0000 mg | DELAYED_RELEASE_TABLET | Freq: Every day | ORAL | 3 refills | Status: DC

## 2022-04-30 NOTE — Patient Instructions (Signed)
Please return in 6 months for recheck.  We will call you with your lab results.   If you have any questions or concerns, please don't hesitate to send me a message via MyChart or call the office at [phone]. Thank you for visiting with [place] today! It's our pleasure caring for you.

## 2022-04-30 NOTE — Progress Notes (Signed)
Subjective  Chief Complaint  Patient presents with   Annual Exam    HPI: [name] is a 88 y.o. female who presents to [facility] today for a Female Wellness Visit. She also has the concerns and/or needs as listed above in the chief complaint. These will be addressed in addition to the Health Maintenance Visit.   Wellness Visit: annual visit with health maintenance review and exam without Pap  HM: screens are no longer indicated. Lives with son. Stable.  Chronic disease f/u and/or acute problem visit: (deemed necessary to be done in addition to the wellness visit): See below for list of chronic medical problems. Each reviewed today. Reports she feels fine. Leg edema has improved; on lasix 40 daily. No more redness or weeping (see last note). No cp, palpitations or sob. No DOE or orthopnea. Sees cards again in January.  HLD On statin. Due for recheck. No AE H/o IFG w/o sxs of hyperglycemia GERD: needs protonix refilled. Reports she takes 40 daily but hasn't tried a lower dose. Sxs are currently controlled.  CKD due for recheck.  Hypothyroidism now back on low dose levothyroxine 14mg daily. Feels fine.   Assessment  1. Essential hypertension   2. Mixed hyperlipidemia   3. Pacemaker   4. SSS (sick sinus syndrome) ([place]   5. CHB (complete heart block) (HCC) s/p pacemaker   6. PAT (paroxysmal atrial tachycardia)   7. Impaired fasting glucose   8. Stage 3a chronic kidney disease (CKD) ([place]   9. Chronic diastolic heart failure ([place]   10. Gastroesophageal reflux disease without esophagitis   11. Chronic cough   12. Lower extremity edema   13. Primary hypothyroidism      Plan  Female Wellness Visit: Age appropriate Health Maintenance and Prevention measures were discussed with patient. Included topics are cancer screening recommendations, ways to keep healthy (see AVS) including dietary and exercise recommendations, regular eye and dental care, use  of seat belts, and avoidance of moderate alcohol use and tobacco use.  BMI: discussed patient's BMI and encouraged positive lifestyle modifications to help get to or maintain a target BMI. HM needs and immunizations were addressed and ordered. See below for orders. See HM and immunization section for updates. Routine labs and screening tests ordered including cmp, cbc and lipids where appropriate. Discussed recommendations regarding Vit D and calcium supplementation (see AVS)  Chronic disease management visit and/or acute problem visit: HTN is controlled. Continue cardizem, and diovan hct. Monitor renal/lytes CKD: recheck Edema, lasix 40 with potassium. Controlled. Trial of protonix 20 for GERD.  Check bnp given few rales on exam today. No sxs. No h/o left CHF.  Recheck TSH on levotx 25 daily. Adjust if needed.  Recheck lipids and lfts  Follow up: 6 mo for recheck  Orders Placed This Encounter  Procedures   CBC with Differential/Platelet   Comprehensive metabolic panel   Lipid panel   TSH   Hemoglobin A1c   B Nat Peptide   Meds ordered this encounter  Medications   DISCONTD: pantoprazole (PROTONIX) 40 MG tablet    Sig: Take 2 tablets (80 mg total) by mouth daily.    Dispense:  60 tablet    Refill:  0   pantoprazole (PROTONIX) 20 MG tablet    Sig: Take 1 tablet (20 mg total) by mouth daily.    Dispense:  90 tablet    Refill:  3      Body mass index is 29.57  kg/m. Wt Readings from Last 3 Encounters:  04/30/22 146 lb 6.4 oz (66.4 kg)  03/03/22 145 lb 9.6 oz (66 kg)  10/28/21 148 lb 3.2 oz (67.2 kg)     Patient Active Problem List   Diagnosis Date Noted   Chronic diastolic heart failure ([place]) 03/03/2022    Priority: High    Grade 1 echo 06/2021    Stage 3a chronic kidney disease (CKD) ([place]) 04/27/2021    Priority: High   SSS (sick sinus syndrome) ([place]) 06/15/2017    Priority: High   PAT (paroxysmal atrial tachycardia) 06/15/2017    Priority: High   Impaired  fasting glucose 07/27/2016    Priority: High   CHB (complete heart block) ([place]) s/p pacemaker 05/17/2013    Priority: High   Pacemaker 02/04/2013    Priority: High    [employer] [place] model 5826 implanted June 2009, complete heart block, pacemaker dependent    Essential hypertension 02/04/2013    Priority: High   Mixed hyperlipidemia 02/04/2013    Priority: High   Osteopenia 04/27/2021    Priority: Medium    Adjustment disorder with anxiety 04/27/2021    Priority: Medium     Intermittent use of lorazepam    Early stage nonexudative age-related macular degeneration of right eye 03/06/2020    Priority: Medium    Early stage nonexudative age-related macular degeneration of left eye 03/06/2020    Priority: Medium    Laryngopharyngeal reflux (LPR) 03/11/2017    Priority: Medium    Primary hypothyroidism 07/27/2016    Priority: Medium    Lower extremity edema 11/22/2014    Priority: Medium    Asthma 02/20/2010    Priority: Medium     PFT 02/11/2010: FEV1 1.39/92%, FEV1/FVC 0.74 small airway flows improved 39% with bronchodilator, TLC 0.87, DLCO 0.64 PFT 12/02/2015-minimal obstruction, minimal restriction, minimal diffusion reduction, insignificant response to bronchodilator although small airway flows improved.    GERD (gastroesophageal reflux disease) 10/26/2007    Priority: Medium    Chronic cough 10/26/2007    Priority: Medium     PFT 02/11/2010 FEV1 1.39/92%, FEV1/FVC 0.74, small airway flows improved 39% bronchodilator, TLC 1.61, DLCO 0.96 Cyclical cough aggravated by reflux with asthma component PFT 12/02/2015-minimal obstruction, minimal restriction, minimal diffusion reduction, insignificant response to bronchodilator although small airway flows improved. Methacholine challenge test 01/30/2016-positive for hyperreactive airways    Choroidal nevus of right eye 03/06/2020    Priority: Low   Pseudophakia 03/06/2020    Priority: Low   Vitamin D deficiency 07/27/2016     Priority: Low   Allergic rhinitis due to pollen 08/21/2010    Priority: Low    Allergy vaccine 1:50,000 03/24/2005; 1:500 04/27/2010 [facility]. Holding at 1:500, both vials, because of local reactions. DC'd 04/27/11/restarted 2013. Allergy profile 02/23/2011 IgE 14.1      Primary insomnia 10/28/2021   Normocytic anemia 07/14/2021   Health Maintenance  Topic Date Due   COVID-19 Vaccine (6 - Mixed Product series) 05/16/2022 (Originally 07/05/2020)   TETANUS/TDAP  10/29/2022 (Originally 10/24/1950)   Medicare Annual Wellness (AWV)  10/28/2022   Pneumonia Vaccine 71+ Years old  Completed   INFLUENZA VACCINE  Completed   DEXA SCAN  Completed   Zoster Vaccines- Shingrix  Completed   HPV VACCINES  Aged Out   Immunization History  Administered Date(s) Administered   Fluad Quad(high Dose 65+) 02/27/2019, 03/03/2022   Influenza Split 02/17/2011, 03/09/2012, 04/03/2012, 03/21/2013   Influenza Whole 02/19/2009, 02/17/2011   Influenza, High Dose Seasonal PF 03/08/2016, 03/09/2017,  03/23/2018, 02/27/2019   Influenza,inj,Quad PF,6+ Mos 03/11/2014, 03/13/2015   Influenza,inj,quad, With Preservative 03/05/2020   Influenza-Unspecified 03/11/2014, 03/13/2015, 03/03/2016, 03/05/2020   Moderna Sars-Covid-2 Vaccination 10/12/2019, 11/03/2019   PFIZER(Purple Top)SARS-COV-2 Vaccination 10/13/2019, 11/03/2019, 05/10/2020   Pneumococcal Conjugate-13 03/21/2014, 03/14/2018   Pneumococcal Polysaccharide-23 06/21/1993, 04/22/1999, 04/21/2004, 06/22/2007   Pneumococcal-Unspecified 06/22/2007   Zoster Recombinat (Shingrix) 08/23/2017, 11/03/2017   Zoster, Live 07/22/2006   Zoster, Unspecified 08/23/2017, 11/03/2017   We updated and reviewed the patient's past history in detail and it is documented below. Allergies: Patient is allergic to fluticasone-salmeterol, sulfa antibiotics, sulfonamide derivatives, tetracycline hcl, benzonatate, erythromycin, meloxicam, and sulfamethoxazole. Past Medical History Patient  has  a past medical history of Allergic rhinitis, Anginal pain ([place]), Asthmatic bronchitis, Chronic cough, Dysrhythmia, Esophageal reflux, GERD (gastroesophageal reflux disease), History of stress test (11/20/2009), echocardiogram (11/05/2010), Hyperlipemia, Hypertension, Hypothyroidism, PAF (paroxysmal atrial fibrillation) ([place]), and Pneumonia. Past Surgical History Patient  has a past surgical history that includes Pacemaker insertion (June 1st 2009); Foot surgery; Thyroid surgery; Vesicovaginal fistula closure w/ TAH; Cholecystectomy; Tonsillectomy; Nasal septum surgery; PPM GENERATOR CHANGEOUT (N/A, 02/09/2017); Tonsillectomy; and Cystoscopy with biopsy (N/A, 08/29/2020). Family History: Patient family history includes Cancer in her father; Stroke in her mother and sister. Social History:  Patient  reports that she has never smoked. She has never used smokeless tobacco. She reports that she does not drink alcohol and does not use drugs.  Review of Systems: Constitutional: negative for fever or malaise Ophthalmic: negative for photophobia, double vision or loss of vision Cardiovascular: negative for chest pain, dyspnea on exertion, or new LE swelling Respiratory: negative for SOB or persistent cough Gastrointestinal: negative for abdominal pain, change in bowel habits or melena Genitourinary: negative for dysuria or gross hematuria, no abnormal uterine bleeding or disharge Musculoskeletal: negative for new gait disturbance or muscular weakness Integumentary: negative for new or persistent rashes, no breast lumps Neurological: negative for TIA or stroke symptoms Psychiatric: negative for SI or delusions Allergic/Immunologic: negative for hives  Patient Care Team    Relationship Specialty Notifications Start End  Leamon Arnt, MD PCP - General Family Medicine  04/27/21   Troy Sine, MD PCP - Cardiology Cardiology  03/06/19   Sanda Klein, MD Consulting Physician Cardiology  06/10/17      Objective  Vitals: BP 136/64   Pulse 72   Temp 98.2 F (36.8 C) (Temporal)   Ht '4\' 11"'$  (1.499 m)   Wt 146 lb 6.4 oz (66.4 kg)   SpO2 99%   BMI 29.57 kg/m  General:  Well developed, well nourished, no acute distress  Psych:  Alert and orientedx3, flat mood and affect HEENT:  Normocephalic, atraumatic, non-icteric sclera,  supple neck without adenopathy, mass or thyromegaly Cardiovascular:  Normal S1, S2, RRR without gallop, rub or murmur, chronic +1 edema w/o redness or weeping Respiratory:  Good breath sounds bilaterally, bibasilar rales, normal respiratory effort Gastrointestinal: normal bowel sounds, soft, non-tender,  Commons side effects, risks, benefits, and alternatives for medications and treatment plan prescribed today were discussed, and the patient expressed understanding of the given instructions. Patient is instructed to call or message via MyChart if he/she has any questions or concerns regarding our treatment plan. No barriers to understanding were identified. We discussed Red Flag symptoms and signs in detail. Patient expressed understanding regarding what to do in case of urgent or emergency type symptoms.  Medication list was reconciled, printed and provided to the patient in AVS. Patient instructions and summary information was reviewed with the  patient as documented in the AVS. This note was prepared with assistance of Dragon voice recognition software. Occasional wrong-word or sound-a-like substitutions may have occurred due to the inherent limitations of voice recognition software

## 2022-05-03 NOTE — Progress Notes (Signed)
Please call patient: I have reviewed his/her lab results. Everything is stable. Sugars remain a little high so limit sweets. No other changes are needed at this time.

## 2022-05-04 ENCOUNTER — Telehealth: Payer: Self-pay | Admitting: Family Medicine

## 2022-05-04 NOTE — Telephone Encounter (Signed)
Pt states: -recent lab results mentioned that blood glucose was high. -son is a diabetic and would like to know what the number was. -she is available any time.   Pt requests: -return phone call.

## 2022-05-04 NOTE — Telephone Encounter (Signed)
Mailbox is full.

## 2022-05-10 ENCOUNTER — Ambulatory Visit (INDEPENDENT_AMBULATORY_CARE_PROVIDER_SITE_OTHER): Payer: Medicare Other | Admitting: Podiatry

## 2022-05-10 ENCOUNTER — Encounter: Payer: Self-pay | Admitting: Podiatry

## 2022-05-10 DIAGNOSIS — B351 Tinea unguium: Secondary | ICD-10-CM

## 2022-05-10 DIAGNOSIS — M79674 Pain in right toe(s): Secondary | ICD-10-CM

## 2022-05-10 DIAGNOSIS — M79675 Pain in left toe(s): Secondary | ICD-10-CM | POA: Diagnosis not present

## 2022-05-15 NOTE — Progress Notes (Signed)
  Subjective:  Patient ID: [name], female    DOB: 1931/12/18,  MRN: [id number]  [name] presents to clinic today for painful thick toenails that are difficult to trim. Pain interferes with ambulation. Aggravating factors include wearing enclosed shoe gear. Pain is relieved with periodic professional debridement.  Chief Complaint  Patient presents with   Nail Problem    Routine foot care PCP-Camille Andy PCP VST-04/30/2022   New problem(s): None.   PCP is Leamon Arnt, MD.  Allergies  Allergen Reactions   Fluticasone-Salmeterol Other (See Comments)    unknown Other reaction(s): tongue/throat symptoms   Sulfa Antibiotics     Other reaction(s): gastric intol   Sulfonamide Derivatives Other (See Comments)    unknown   Tetracycline Hcl    Benzonatate Other (See Comments)    tongue/throat symptoms in the past but tolerated more recently Other reaction(s): and tongue/throat symptoms in the past (not more recently)   Erythromycin Nausea And Vomiting and Other (See Comments)    Stomach cramps Other reaction(s): gastric intol   Meloxicam Other (See Comments)    Mild dyspnea Other reaction(s): dyspnea   Sulfamethoxazole Other (See Comments)    Gastric intolerance    Review of Systems: Negative except as noted in the HPI.  Objective: No changes noted in today's physical examination.  [name] is a pleasant 88 y.o. female WD, WN in NAD. AAO x 3.  Vascular Examination: Vascular status intact b/l with palpable pedal pulses. CFT immediate b/l. No edema. No pain with calf compression b/l. Skin temperature gradient WNL b/l. Pedal hair sparse.  Neurological Examination: Sensation grossly intact b/l with 10 gram monofilament. Vibratory sensation intact b/l.   Dermatological Examination: Pedal skin with normal turgor, texture and tone b/l. Toenails 1-5 b/l thick, discolored, elongated with subungual debris and pain on dorsal palpation. No hyperkeratotic lesions noted  b/l.   Musculoskeletal Examination: Muscle strength 5/5 to b/l LE. HAV with bunion deformity noted b/l LE. Great toes abutting against b/l 2nd digits. Patient utilizes walker for ambulation assistance.  Radiographs: None Assessment/Plan: 1. Pain due to onychomycosis of toenails of both feet     No orders of the defined types were placed in this encounter.   -Patient was evaluated and treated. All patient's and/or POA's questions/concerns answered on today's visit. -Band-aids applied to bilateral great toes and right 2nd digit. Apply padding daily to prevent rubbing of digits. -Toenails 2-5 bilaterally and R hallux debrided in length and girth without iatrogenic bleeding with sterile nail nipper and dremel.  -No invasive procedure(s) performed. Offending nail border debrided and curretaged left great toe utilizing sterile nail nipper and currette. Border cleansed with alcohol and triple antibiotic ointment. No further treatment required by patient/caregiver. Call office if there are any concerns. -Patient/POA to call should there be question/concern in the interim.   Return in about 3 months (around 08/10/2022).  Marzetta Board, DPM

## 2022-05-17 ENCOUNTER — Telehealth: Payer: Self-pay | Admitting: Family Medicine

## 2022-05-17 ENCOUNTER — Ambulatory Visit (INDEPENDENT_AMBULATORY_CARE_PROVIDER_SITE_OTHER): Payer: Medicare Other | Admitting: Family

## 2022-05-17 ENCOUNTER — Encounter: Payer: Self-pay | Admitting: Family

## 2022-05-17 VITALS — BP 139/62 | HR 82 | Temp 98.2°F | Ht 59.0 in | Wt 147.5 lb

## 2022-05-17 DIAGNOSIS — L03119 Cellulitis of unspecified part of limb: Secondary | ICD-10-CM

## 2022-05-17 MED ORDER — DOXYCYCLINE HYCLATE 100 MG PO CAPS: 100.0000 mg | ORAL_CAPSULE | Freq: Two times a day (BID) | ORAL | 0 refills | Status: DC

## 2022-05-17 NOTE — Telephone Encounter (Signed)
Routing to PCP team and [facility] team for 05/17/22 appt.  Patient Name: [name] [facility] Gender: Female DOB: Jul 17, 1931 Age: 88 Y 30 M 21 D Return Phone Number: [phone] (Primary) Address: City/ State/ Zip: [place]  [address] Client [place] [facility] [place] Client Site [place] [facility] [place] [facility] Provider Billey Chang- MD Contact Type Call Who Is Calling Patient / Member / Family / Caregiver Call Type Triage / Clinical Relationship To Patient Self Return Phone Number [phone] (Primary) Chief Complaint Rash - Widespread Reason for Call Symptomatic / Request for Rule states she has a red rash from her ankles to her knees bilaterally. It developed on Friday. Translation No Nurse Assessment Nurse: [name], RN, Lelan Pons Date/Time Eilene Ghazi Time): 05/17/2022 8:33:59 AM Confirm and document reason for call. If symptomatic, describe symptoms. ---Caller states she has a red rash from her ankles to her knees bilaterally. It developed on Friday. It feels fiery and burning and itching and looks very red. Does the patient have any new or worsening symptoms? ---Yes Will a triage be completed? ---Yes Related visit to physician within the last 2 weeks? ---No Does the PT have any chronic conditions? (i.e. diabetes, asthma, this includes High risk factors for pregnancy, etc.) ---Yes List chronic conditions. ---HTN Is this a behavioral health or substance abuse call? ---No Guidelines Guideline Title Affirmed Question Affirmed Notes Nurse Date/Time (Eastern Time) Rash or Redness - Localized [1] Looks infected (spreading redness, pus) AND [2] large red area (> 2 in. or 5 cm) [name], RN, Lelan Pons 05/17/2022 8:35:32 AM  Disp. Time Eilene Ghazi Time) Disposition Final User 05/17/2022 8:36:31 AM See HCP within 4 Hours (or PCP triage) Yes [name], RN, Lelan Pons Final Disposition 05/17/2022 8:36:31 AM See HCP  within 4 Hours (or PCP triage) Yes [name], RN, [place] Disagree/Comply Comply Caller Understands Yes PreDisposition Call Doctor Care Advice Given Per Guideline SEE HCP (OR PCP TRIAGE) WITHIN 4 HOURS: * IF OFFICE WILL BE OPEN: You need to be seen within the next 3 or 4 hours. Call your doctor (or NP/PA) now or as soon as the office opens. CARE ADVICE given per Rash - Localized and Cause Unknown (Adult) guideline. CALL BACK IF: * You become worse Referrals REFERRED TO PCP OFFICE

## 2022-05-17 NOTE — Telephone Encounter (Signed)
Pt has an appt for today in regards to her rash.

## 2022-05-17 NOTE — Telephone Encounter (Signed)
-  Red rash from ankles to knees, itches, keeping patient awake.  -Began around 05/14/22 -Didn't know what she wanted to do.   Warm transferred to [facility], patient coordinator for PCP Triage / Team Health Nurse. Awaiting follow up notes.

## 2022-05-17 NOTE — Progress Notes (Signed)
Patient ID: [name], female    DOB: 06-14-1932, 88 y.o.   MRN: [id number]  Chief Complaint  Patient presents with   Leg Swelling    Pt c/o bilateral leg swelling and redness since Friday. Pt states she does elevate her legs at night. Sometime painful but mostly itching and burning.     HPI:      Bilateral leg swelling:  pt reports swelling in both legs with red rash, itching, and burning all starting on Friday, pt son reports giving her an extra Lasix for 2 days, but not today, seemed to help the swelling slightly, but not the rash. Reports having swelling off & on for long time, but never with a rash before. pt reports applying jergens lotion on both legs daily & wonders if this caused the rash.      Assessment & Plan:  1. Cellulitis of lower extremity, unspecified laterality - both legs with splotchy erythema & warmth, pt c/o burning pain. sending DOXY, advised on use & SE. Advised to take an extra Lasix & potassium today and tomorrow only. Report back if sx are not improving after taking the abt for several days.  - doxycycline (VIBRAMYCIN) 100 MG capsule; Take 1 capsule (100 mg total) by mouth 2 (two) times daily.  Dispense: 14 capsule; Refill: 0  Subjective:    Outpatient Medications Prior to Visit  Medication Sig Dispense Refill   Acetaminophen (TYLENOL) 325 MG CAPS Tylenol     acetaminophen (TYLENOL) 500 MG tablet 1000 mg by oral route.     Ascorbic Acid (VITAMIN C) 100 MG tablet 500 mg daily.     aspirin 81 MG chewable tablet Chew 81 mg by mouth at bedtime.     calcium carbonate (TUMS EX) 750 MG chewable tablet Chew 1 tablet by mouth daily as needed for heartburn.     cetirizine (ZYRTEC) 10 MG tablet      cholecalciferol (VITAMIN D3) 25 MCG (1000 UNIT) tablet Take 1 tablet (1,000 Units total) by mouth daily. 30 tablet 0   conjugated estrogens (PREMARIN) vaginal cream      cromolyn (OPTICROM) 4 % ophthalmic solution INSTILL 1 DROP INTO BOTH EYES 4 TIMES A DAY      diltiazem (CARDIZEM CD) 180 MG 24 hr capsule Take by mouth.     docusate sodium (COLACE) 100 MG capsule Take 1 capsule (100 mg total) by mouth 2 (two) times daily. 10 capsule 0   furosemide (LASIX) 40 MG tablet Take 1 tablet (40 mg total) by mouth daily. May take one extra tablet daily as needed for edema. 35 tablet 11   levothyroxine (SYNTHROID) 25 MCG tablet Take 1 tablet (25 mcg total) by mouth daily. 90 tablet 3   lidocaine (LIDODERM) 5 %      LORazepam (ATIVAN) 0.5 MG tablet TAKE 1 TABLET BY MOUTH EVERY DAY AS NEEDED FOR ANXIETY 30 tablet 2   melatonin 3 MG TABS tablet 3 mg by oral route.     methocarbamol (ROBAXIN) 500 MG tablet Take 1 tablet (500 mg total) by mouth every 8 (eight) hours as needed for muscle spasms. 30 tablet 0   metoprolol succinate (TOPROL-XL) 25 MG 24 hr tablet Take 1 tablet (25 mg total) by mouth at bedtime. 90 tablet 3   pantoprazole (PROTONIX) 20 MG tablet Take 1 tablet (20 mg total) by mouth daily. 90 tablet 3   pravastatin (PRAVACHOL) 40 MG tablet TAKE 1 TABLET BY MOUTH EVERY DAY 90 tablet 3   tiZANidine (  ZANAFLEX) 2 MG tablet tizanidine 2 mg tablet  Take 1 tablet 3 times a day by oral route as needed.     traMADol (ULTRAM) 50 MG tablet Take 1 tablet twice a day by oral route as needed.     traZODone (DESYREL) 50 MG tablet Take 0.5-1 tablets (25-50 mg total) by mouth at bedtime as needed for sleep. 30 tablet 5   valsartan-hydrochlorothiazide (DIOVAN-HCT) 160-25 MG tablet Take 1 tablet by mouth daily.     potassium chloride SA (KLOR-CON M20) 20 MEQ tablet Take 1 tablet (20 mEq total) by mouth daily. Take an extra tablet once daily when you take the extra furosemide 35 tablet 11   No facility-administered medications prior to visit.   Past Medical History:  Diagnosis Date   Allergic rhinitis    Anginal pain (HCC)    Asthmatic bronchitis    Chronic cough    Dysrhythmia    PAF   Esophageal reflux    GERD (gastroesophageal reflux disease)    History of stress  test 11/20/2009   This was essentially normal,post-stress EF was hyperdynamic at 73 beats per mins, There was no scar or ischemia.   Hx of echocardiogram 11/05/2010   EF>55% showed a normal systolic function with impaired diastolic dysfuntion and also tissue doppler suggesting increased elevated left atrial pressure, she had moderate LA dilatation and mild LA dilatation. There was calcified mitral apparatus with moderate MR, mild to moderate TR and trace aortic insufficiency.   Hyperlipemia    Hypertension    Hypothyroidism    PAF (paroxysmal atrial fibrillation) ([place])    Pneumonia    Past Surgical History:  Procedure Laterality Date   CHOLECYSTECTOMY     CYSTOSCOPY WITH BIOPSY N/A 08/29/2020   Procedure: CYSTOSCOPY WITH BIOPSY WITH FULGURATION;  Surgeon: Irine Seal, MD;  Location: [facility];  Service: Urology;  Laterality: N/A;   FOOT SURGERY     right   NASAL SEPTUM SURGERY     PACEMAKER INSERTION  June 1st 2009   St Jude [place] device, model #5826, serial #[id number], The artial lead is a Interior and spatial designer, the ventricular lead is Environmental education officer.   PPM GENERATOR CHANGEOUT N/A 02/09/2017   Procedure: PPM GENERATOR CHANGEOUT;  Surgeon: Sanda Klein, MD;  Location: [place] [facility];  Service: Cardiovascular;  Laterality: N/A;   THYROID SURGERY     TONSILLECTOMY     TONSILLECTOMY     VESICOVAGINAL FISTULA CLOSURE W/ TAH     Allergies  Allergen Reactions   Fluticasone-Salmeterol Other (See Comments)    unknown Other reaction(s): tongue/throat symptoms   Sulfa Antibiotics     Other reaction(s): gastric intol   Sulfonamide Derivatives Other (See Comments)    unknown   Tetracycline Hcl    Benzonatate Other (See Comments)    tongue/throat symptoms in the past but tolerated more recently Other reaction(s): and tongue/throat symptoms in the past (not more recently)   Erythromycin Nausea And Vomiting and Other (See Comments)    Stomach cramps Other reaction(s): gastric intol   Meloxicam  Other (See Comments)    Mild dyspnea Other reaction(s): dyspnea   Sulfamethoxazole Other (See Comments)    Gastric intolerance      Objective:    Physical Exam Vitals and nursing note reviewed.  Constitutional:      Appearance: Normal appearance. She is obese.  Cardiovascular:     Rate and Rhythm: Normal rate and regular rhythm.  Pulmonary:     Effort: Pulmonary effort is  normal.     Breath sounds: Normal breath sounds.  Musculoskeletal:        General: Normal range of motion.     Right lower leg: 3+ Edema (with splotchy erythema, warm to touch) present.     Left lower leg: Edema (with splotchy erythema, warm to touch) present.  Skin:    General: Skin is warm and dry.  Neurological:     Mental Status: She is alert.  Psychiatric:        Mood and Affect: Mood normal.        Behavior: Behavior normal.    BP 139/62 (BP Location: Left Arm, Patient Position: Sitting, Cuff Size: Large)   Pulse 82   Temp 98.2 F (36.8 C) (Temporal)   Ht '4\' 11"'$  (1.499 m)   Wt 147 lb 8 oz (66.9 kg)   SpO2 96%   BMI 29.79 kg/m  Wt Readings from Last 3 Encounters:  05/17/22 147 lb 8 oz (66.9 kg)  04/30/22 146 lb 6.4 oz (66.4 kg)  03/03/22 145 lb 9.6 oz (66 kg)       Jeanie Sewer, NP

## 2022-05-21 ENCOUNTER — Ambulatory Visit (INDEPENDENT_AMBULATORY_CARE_PROVIDER_SITE_OTHER): Payer: Medicare Other | Admitting: Family

## 2022-05-21 ENCOUNTER — Other Ambulatory Visit: Payer: Self-pay

## 2022-05-21 ENCOUNTER — Encounter: Payer: Self-pay | Admitting: Family

## 2022-05-21 VITALS — BP 131/71 | HR 87 | Temp 97.6°F | Ht 59.0 in | Wt 151.4 lb

## 2022-05-21 DIAGNOSIS — L03115 Cellulitis of right lower limb: Secondary | ICD-10-CM

## 2022-05-21 DIAGNOSIS — L03116 Cellulitis of left lower limb: Secondary | ICD-10-CM | POA: Diagnosis not present

## 2022-05-21 DIAGNOSIS — L309 Dermatitis, unspecified: Secondary | ICD-10-CM

## 2022-05-21 MED ORDER — HYDROXYZINE HCL 10 MG PO TABS: 10.0000 mg | ORAL_TABLET | Freq: Three times a day (TID) | ORAL | 0 refills | Status: DC | PRN

## 2022-05-21 NOTE — Progress Notes (Signed)
Patient ID: [name], female    DOB: 07/22/1931, 88 y.o.   MRN: [id number]  Chief Complaint  Patient presents with   Follow-up    Pt states bilateral legs are getting worse and more swollen. Pt states she does elevate her legs at night and they are itching terribly. Pt is taking Doxycycline.     HPI: Bilateral leg swelling:  pt seen on Monday for swelling in both legs with red rash, itching, and burning all starting on the Friday before, pt son reports giving her an extra Lasix for 2 days, seemed to help the swelling slightly, but not the rash. I gave her DOXY for 7d, pt has taken for 5d without any improvement. Reports having swelling off & on for long time, but never with a rash before. pt reports no change in swelling after increasing Lasix 2 more days and rash is worse.  Assessment & Plan:   Problem List Items Addressed This Visit   None Visit Diagnoses     Bilateral cellulitis of lower leg    -  Primary Legs today with same amount of swelling, but rash is worse, more red, warm, and itchy, with mild weeping. DOXY has not changed sx, advised to stop.  Unna boots w/calamine applied to both legs and advised pt to return in 1 week to remove. Take extra Lasix = '80mg'$  for next 3 days, then '60mg'$  qd until RTO in 1 week Checking labs today. Emphasized importance of elevating legs as much as possible and esp since compression on legs they may be sore for first 24h unless elevated.    Relevant Orders   Basic Metabolic Panel (BMET)   CBC with Differential/Platelet - Apply dressing    Dermatitis    - bilateral lower legs with lots of itching. Unna boots applied. Sending Hydroxyzine for itching, advised on use & SE.     Relevant Medications   hydrOXYzine (ATARAX) 10 MG tablet   Subjective:    Outpatient Medications Prior to Visit  Medication Sig Dispense Refill   Acetaminophen (TYLENOL) 325 MG CAPS Tylenol     acetaminophen (TYLENOL) 500 MG tablet 1000 mg by oral route.     Ascorbic  Acid (VITAMIN C) 100 MG tablet 500 mg daily.     aspirin 81 MG chewable tablet Chew 81 mg by mouth at bedtime.     calcium carbonate (TUMS EX) 750 MG chewable tablet Chew 1 tablet by mouth daily as needed for heartburn.     cetirizine (ZYRTEC) 10 MG tablet      cholecalciferol (VITAMIN D3) 25 MCG (1000 UNIT) tablet Take 1 tablet (1,000 Units total) by mouth daily. 30 tablet 0   conjugated estrogens (PREMARIN) vaginal cream      cromolyn (OPTICROM) 4 % ophthalmic solution INSTILL 1 DROP INTO BOTH EYES 4 TIMES A DAY     diltiazem (CARDIZEM CD) 180 MG 24 hr capsule Take by mouth.     docusate sodium (COLACE) 100 MG capsule Take 1 capsule (100 mg total) by mouth 2 (two) times daily. 10 capsule 0   doxycycline (VIBRAMYCIN) 100 MG capsule Take 1 capsule (100 mg total) by mouth 2 (two) times daily. 14 capsule 0   furosemide (LASIX) 40 MG tablet Take 1 tablet (40 mg total) by mouth daily. May take one extra tablet daily as needed for edema. 35 tablet 11   levothyroxine (SYNTHROID) 25 MCG tablet Take 1 tablet (25 mcg total) by mouth daily. 90 tablet 3   lidocaine (  LIDODERM) 5 %      LORazepam (ATIVAN) 0.5 MG tablet TAKE 1 TABLET BY MOUTH EVERY DAY AS NEEDED FOR ANXIETY 30 tablet 2   melatonin 3 MG TABS tablet 3 mg by oral route.     methocarbamol (ROBAXIN) 500 MG tablet Take 1 tablet (500 mg total) by mouth every 8 (eight) hours as needed for muscle spasms. 30 tablet 0   metoprolol succinate (TOPROL-XL) 25 MG 24 hr tablet Take 1 tablet (25 mg total) by mouth at bedtime. 90 tablet 3   pantoprazole (PROTONIX) 20 MG tablet Take 1 tablet (20 mg total) by mouth daily. 90 tablet 3   pravastatin (PRAVACHOL) 40 MG tablet TAKE 1 TABLET BY MOUTH EVERY DAY 90 tablet 3   tiZANidine (ZANAFLEX) 2 MG tablet tizanidine 2 mg tablet  Take 1 tablet 3 times a day by oral route as needed.     traMADol (ULTRAM) 50 MG tablet Take 1 tablet twice a day by oral route as needed.     traZODone (DESYREL) 50 MG tablet Take 0.5-1  tablets (25-50 mg total) by mouth at bedtime as needed for sleep. 30 tablet 5   valsartan-hydrochlorothiazide (DIOVAN-HCT) 160-25 MG tablet Take 1 tablet by mouth daily.     potassium chloride SA (KLOR-CON M20) 20 MEQ tablet Take 1 tablet (20 mEq total) by mouth daily. Take an extra tablet once daily when you take the extra furosemide 35 tablet 11   No facility-administered medications prior to visit.   Past Medical History:  Diagnosis Date   Allergic rhinitis    Anginal pain (HCC)    Asthmatic bronchitis    Chronic cough    Dysrhythmia    PAF   Esophageal reflux    GERD (gastroesophageal reflux disease)    History of stress test 11/20/2009   This was essentially normal,post-stress EF was hyperdynamic at 73 beats per mins, There was no scar or ischemia.   Hx of echocardiogram 11/05/2010   EF>55% showed a normal systolic function with impaired diastolic dysfuntion and also tissue doppler suggesting increased elevated left atrial pressure, she had moderate LA dilatation and mild LA dilatation. There was calcified mitral apparatus with moderate MR, mild to moderate TR and trace aortic insufficiency.   Hyperlipemia    Hypertension    Hypothyroidism    PAF (paroxysmal atrial fibrillation) ([place])    Pneumonia    Past Surgical History:  Procedure Laterality Date   CHOLECYSTECTOMY     CYSTOSCOPY WITH BIOPSY N/A 08/29/2020   Procedure: CYSTOSCOPY WITH BIOPSY WITH FULGURATION;  Surgeon: Irine Seal, MD;  Location: [facility];  Service: Urology;  Laterality: N/A;   FOOT SURGERY     right   NASAL SEPTUM SURGERY     PACEMAKER INSERTION  June 1st 2009   St Jude [place] device, model #5826, serial #[id number], The artial lead is a Interior and spatial designer, the ventricular lead is Environmental education officer.   PPM GENERATOR CHANGEOUT N/A 02/09/2017   Procedure: PPM GENERATOR CHANGEOUT;  Surgeon: Sanda Klein, MD;  Location: [place] [facility];  Service: Cardiovascular;  Laterality: N/A;   THYROID SURGERY     TONSILLECTOMY      TONSILLECTOMY     VESICOVAGINAL FISTULA CLOSURE W/ TAH     Allergies  Allergen Reactions   Fluticasone-Salmeterol Other (See Comments)    unknown Other reaction(s): tongue/throat symptoms   Sulfa Antibiotics     Other reaction(s): gastric intol   Sulfonamide Derivatives Other (See Comments)    unknown  Tetracycline Hcl    Benzonatate Other (See Comments)    tongue/throat symptoms in the past but tolerated more recently Other reaction(s): and tongue/throat symptoms in the past (not more recently)   Erythromycin Nausea And Vomiting and Other (See Comments)    Stomach cramps Other reaction(s): gastric intol   Meloxicam Other (See Comments)    Mild dyspnea Other reaction(s): dyspnea   Sulfamethoxazole Other (See Comments)    Gastric intolerance      Objective:    Physical Exam Vitals and nursing note reviewed.  Constitutional:      Appearance: Normal appearance. She is obese.  Cardiovascular:     Rate and Rhythm: Normal rate and regular rhythm.  Pulmonary:     Effort: Pulmonary effort is normal.     Breath sounds: Normal breath sounds.  Musculoskeletal:        General: Normal range of motion.     Right lower leg: 3+ Edema (with splotchy erythema, warm to touch) present.     Left lower leg: Edema (with splotchy erythema, warm to touch) present.  Skin:    General: Skin is warm and dry.  Neurological:     Mental Status: She is alert.  Psychiatric:        Mood and Affect: Mood normal.        Behavior: Behavior normal.    BP 131/71 (BP Location: Left Arm, Patient Position: Sitting, Cuff Size: Large)   Pulse 87   Temp 97.6 F (36.4 C) (Temporal)   Ht '4\' 11"'$  (1.499 m)   Wt 151 lb 6 oz (68.7 kg)   SpO2 98%   BMI 30.57 kg/m  Wt Readings from Last 3 Encounters:  05/21/22 151 lb 6 oz (68.7 kg)  05/17/22 147 lb 8 oz (66.9 kg)  04/30/22 146 lb 6.4 oz (66.4 kg)       Jeanie Sewer, NP

## 2022-05-22 LAB — CBC WITH DIFFERENTIAL/PLATELET
Absolute Monocytes: 795 cells/uL (ref 200–950)
Basophils Absolute: 39 cells/uL (ref 0–200)
Basophils Relative: 0.4 %
Eosinophils Absolute: 922 cells/uL — ABNORMAL HIGH (ref 15–500)
Eosinophils Relative: 9.5 %
HCT: 30.3 % — ABNORMAL LOW (ref 35.0–45.0)
Hemoglobin: 10.4 g/dL — ABNORMAL LOW (ref 11.7–15.5)
Lymphs Abs: 1979 cells/uL (ref 850–3900)
MCH: 30.1 pg (ref 27.0–33.0)
MCHC: 34.3 g/dL (ref 32.0–36.0)
MCV: 87.8 fL (ref 80.0–100.0)
MPV: 10.1 fL (ref 7.5–12.5)
Monocytes Relative: 8.2 %
Neutro Abs: 5966 cells/uL (ref 1500–7800)
Neutrophils Relative %: 61.5 %
Platelets: 278 10*3/uL (ref 140–400)
RBC: 3.45 10*6/uL — ABNORMAL LOW (ref 3.80–5.10)
RDW: 11.8 % (ref 11.0–15.0)
Total Lymphocyte: 20.4 %
WBC: 9.7 10*3/uL (ref 3.8–10.8)

## 2022-05-22 LAB — BASIC METABOLIC PANEL
BUN/Creatinine Ratio: 31 (calc) — ABNORMAL HIGH (ref 6–22)
BUN: 46 mg/dL — ABNORMAL HIGH (ref 7–25)
CO2: 25 mmol/L (ref 20–32)
Calcium: 9 mg/dL (ref 8.6–10.4)
Chloride: 99 mmol/L (ref 98–110)
Creat: 1.48 mg/dL — ABNORMAL HIGH (ref 0.60–0.95)
Glucose, Bld: 117 mg/dL — ABNORMAL HIGH (ref 65–99)
Potassium: 3.9 mmol/L (ref 3.5–5.3)
Sodium: 138 mmol/L (ref 135–146)

## 2022-05-23 NOTE — Progress Notes (Signed)
Your kidney function is down, must drink more water - shooting for 6-8 cups per day.  Potassium is in good range. Blood count shows she is having an allergic reaction resulting in her itchy legs with the rash. Not sure if exposed to something else she is applying to her legs or if due to the swelling & weeping.   Is the Hydroxyzine med that I sent helping with the itching?  How is she doing with the unna boots (let wraps) I put on Friday?  Let me know, thx

## 2022-05-31 ENCOUNTER — Encounter: Payer: Self-pay | Admitting: Family Medicine

## 2022-05-31 ENCOUNTER — Ambulatory Visit (INDEPENDENT_AMBULATORY_CARE_PROVIDER_SITE_OTHER): Payer: Medicare Other | Admitting: Family Medicine

## 2022-05-31 VITALS — BP 138/72 | HR 83 | Temp 98.4°F | Ht 59.0 in | Wt 156.6 lb

## 2022-05-31 DIAGNOSIS — N1831 Chronic kidney disease, stage 3a: Secondary | ICD-10-CM

## 2022-05-31 DIAGNOSIS — R600 Localized edema: Secondary | ICD-10-CM | POA: Diagnosis not present

## 2022-05-31 DIAGNOSIS — R053 Chronic cough: Secondary | ICD-10-CM

## 2022-05-31 DIAGNOSIS — I872 Venous insufficiency (chronic) (peripheral): Secondary | ICD-10-CM

## 2022-05-31 DIAGNOSIS — I5032 Chronic diastolic (congestive) heart failure: Secondary | ICD-10-CM | POA: Diagnosis not present

## 2022-05-31 MED ORDER — TRIAMCINOLONE ACETONIDE 0.1 % EX CREA: 1.0000 | TOPICAL_CREAM | Freq: Two times a day (BID) | CUTANEOUS | 0 refills | Status: DC

## 2022-05-31 NOTE — Patient Instructions (Signed)
Please return in 1-2 weeks for recheck  Please increase your lasix to twice daily along with the potassium supplement.  Elevate your legs.  Use the steroid cream as needed for redness.   Please go to our [facility] office to get your xrays done. You can walk in M-F between 8:30am- noon or 1pm - 5pm. Tell them you are there for xrays ordered by me. They will send me the results, then I will let you know the results with instructions.   Address: [address].  The Xray department is located in the basement.    If you have any questions or concerns, please don't hesitate to send me a message via MyChart or call the office at [phone]. Thank you for visiting with [place] today! It's our pleasure caring for you.

## 2022-05-31 NOTE — Progress Notes (Signed)
Subjective  CC:  Chief Complaint  Patient presents with   Bilateral cellulitis of lower leg    HPI: [name] is a 88 y.o. female who presents to the office today to address the problems listed above in the chief complaint. 88 yo treated for bilateral leg cellulitis; I've reviewed notes.  Unna boots since 12/1. Bothersome.  Reports food intake is low although high in sodium and light water drinker. Denies sob but complains of active cough and son reports some wheezing. No fever/chills, cp, st or cold sxs. Has h/o chronic cough Sees cards annually for chronic diastolic heart failure. No cp or palpitations.  Weight is up 10 pounds from baseline.  Taking lasix 40 daily. With k.  Labs with chronic kidney disease and last with acute on chronic. Potassium is stable.    Wt Readings from Last 3 Encounters:  05/31/22 156 lb 9.6 oz (71 kg)  05/21/22 151 lb 6 oz (68.7 kg)  05/17/22 147 lb 8 oz (66.9 kg)   BP Readings from Last 3 Encounters:  05/31/22 138/72  05/21/22 131/71  05/17/22 139/62   Lab Results  Component Value Date   CREATININE 1.48 (H) 05/21/2022   BUN 46 (H) 05/21/2022   NA 138 05/21/2022   K 3.9 05/21/2022   CL 99 05/21/2022   CO2 25 05/21/2022    Assessment  1. Chronic diastolic heart failure (HCC)   2. Venous insufficiency of both lower extremities   3. Stage 3a chronic kidney disease (CKD) ([place])   4. Chronic cough   5. Lower extremity edema      Plan  edema:  improved after unna boot but still with lots of edema. Increase lasix to 40 bid w/ k. Monitor daily weights. Lessen sodium in diet. Will need to monitor renal function. Triamcinolone cream for erythema due to inflammation. Pt does not want to wear compression stockings. Rales on exam: check cxr. ? Left CHF. Check bnp. May warrant another echo  Follow up: 1-2 weeks.   10/27/2022  Orders Placed This Encounter  Procedures   DG Chest 2 View   Pro b natriuretic peptide   TSH   Comprehensive  metabolic panel   No orders of the defined types were placed in this encounter.     I reviewed the patients updated PMH, FH, and SocHx.    Patient Active Problem List   Diagnosis Date Noted   Chronic diastolic heart failure ([place]) 03/03/2022    Priority: High   Stage 3a chronic kidney disease (CKD) ([place]) 04/27/2021    Priority: High   SSS (sick sinus syndrome) (HCC) 06/15/2017    Priority: High   PAT (paroxysmal atrial tachycardia) 06/15/2017    Priority: High   Impaired fasting glucose 07/27/2016    Priority: High   CHB (complete heart block) ([place]) s/p pacemaker 05/17/2013    Priority: High   Pacemaker 02/04/2013    Priority: High   Essential hypertension 02/04/2013    Priority: High   Mixed hyperlipidemia 02/04/2013    Priority: High   Osteopenia 04/27/2021    Priority: Medium    Adjustment disorder with anxiety 04/27/2021    Priority: Medium    Early stage nonexudative age-related macular degeneration of right eye 03/06/2020    Priority: Medium    Early stage nonexudative age-related macular degeneration of left eye 03/06/2020    Priority: Medium    Laryngopharyngeal reflux (LPR) 03/11/2017    Priority: Medium    Primary hypothyroidism 07/27/2016  Priority: Medium    Lower extremity edema 11/22/2014    Priority: Medium    Asthma 02/20/2010    Priority: Medium    GERD (gastroesophageal reflux disease) 10/26/2007    Priority: Medium    Chronic cough 10/26/2007    Priority: Medium    Choroidal nevus of right eye 03/06/2020    Priority: Low   Pseudophakia 03/06/2020    Priority: Low   Vitamin D deficiency 07/27/2016    Priority: Low   Allergic rhinitis due to pollen 08/21/2010    Priority: Low   Venous insufficiency of both lower extremities 05/31/2022   Primary insomnia 10/28/2021   Normocytic anemia 07/14/2021   Current Meds  Medication Sig   Acetaminophen (TYLENOL) 325 MG CAPS Tylenol   acetaminophen (TYLENOL) 500 MG tablet 1000 mg by oral route.    Ascorbic Acid (VITAMIN C) 100 MG tablet 500 mg daily.   aspirin 81 MG chewable tablet Chew 81 mg by mouth at bedtime.   calcium carbonate (TUMS EX) 750 MG chewable tablet Chew 1 tablet by mouth daily as needed for heartburn.   cetirizine (ZYRTEC) 10 MG tablet    cholecalciferol (VITAMIN D3) 25 MCG (1000 UNIT) tablet Take 1 tablet (1,000 Units total) by mouth daily.   conjugated estrogens (PREMARIN) vaginal cream    cromolyn (OPTICROM) 4 % ophthalmic solution INSTILL 1 DROP INTO BOTH EYES 4 TIMES A DAY   diltiazem (CARDIZEM CD) 180 MG 24 hr capsule Take by mouth.   docusate sodium (COLACE) 100 MG capsule Take 1 capsule (100 mg total) by mouth 2 (two) times daily.   doxycycline (VIBRAMYCIN) 100 MG capsule Take 1 capsule (100 mg total) by mouth 2 (two) times daily.   furosemide (LASIX) 40 MG tablet Take 1 tablet (40 mg total) by mouth daily. May take one extra tablet daily as needed for edema.   hydrOXYzine (ATARAX) 10 MG tablet Take 1 tablet (10 mg total) by mouth 3 (three) times daily as needed for itching.   levothyroxine (SYNTHROID) 25 MCG tablet Take 1 tablet (25 mcg total) by mouth daily.   lidocaine (LIDODERM) 5 %    LORazepam (ATIVAN) 0.5 MG tablet TAKE 1 TABLET BY MOUTH EVERY DAY AS NEEDED FOR ANXIETY   melatonin 3 MG TABS tablet 3 mg by oral route.   methocarbamol (ROBAXIN) 500 MG tablet Take 1 tablet (500 mg total) by mouth every 8 (eight) hours as needed for muscle spasms.   metoprolol succinate (TOPROL-XL) 25 MG 24 hr tablet Take 1 tablet (25 mg total) by mouth at bedtime.   pantoprazole (PROTONIX) 20 MG tablet Take 1 tablet (20 mg total) by mouth daily.   pravastatin (PRAVACHOL) 40 MG tablet TAKE 1 TABLET BY MOUTH EVERY DAY   tiZANidine (ZANAFLEX) 2 MG tablet tizanidine 2 mg tablet  Take 1 tablet 3 times a day by oral route as needed.   traMADol (ULTRAM) 50 MG tablet Take 1 tablet twice a day by oral route as needed.   traZODone (DESYREL) 50 MG tablet Take 0.5-1 tablets (25-50 mg  total) by mouth at bedtime as needed for sleep.   valsartan-hydrochlorothiazide (DIOVAN-HCT) 160-25 MG tablet Take 1 tablet by mouth daily.    Allergies: Patient is allergic to fluticasone-salmeterol, sulfa antibiotics, sulfonamide derivatives, tetracycline hcl, benzonatate, erythromycin, meloxicam, and sulfamethoxazole. Family History: Patient family history includes Cancer in her father; Stroke in her mother and sister. Social History:  Patient  reports that she has never smoked. She has never used smokeless tobacco. She  reports that she does not drink alcohol and does not use drugs.  Review of Systems: Constitutional: Negative for fever malaise or anorexia Cardiovascular: negative for chest pain Respiratory: negative for SOB or persistent cough Gastrointestinal: negative for abdominal pain  Objective  Vitals: BP 138/72   Pulse 83   Temp 98.4 F (36.9 C)   Ht '4\' 11"'$  (1.499 m)   Wt 156 lb 9.6 oz (71 kg)   SpO2 97%   BMI 31.63 kg/m  General: no acute distress , A&Ox3 HEENT: [place]ER[place], conjunctiva normal, neck is supple Cardiovascular:  RRR without murmur or gallop.  Respiratory:  Good breath sounds bilaterally, right basilar rales. No wheezing Skin:  Warm, no rashes Ext: removed unna boots; minimal erythema persists. 2-3 + pitting edema to knees.     Commons side effects, risks, benefits, and alternatives for medications and treatment plan prescribed today were discussed, and the patient expressed understanding of the given instructions. Patient is instructed to call or message via MyChart if he/she has any questions or concerns regarding our treatment plan. No barriers to understanding were identified. We discussed Red Flag symptoms and signs in detail. Patient expressed understanding regarding what to do in case of urgent or emergency type symptoms.  Medication list was reconciled, printed and provided to the patient in AVS. Patient instructions and summary information was reviewed  with the patient as documented in the AVS. This note was prepared with assistance of Dragon voice recognition software. Occasional wrong-word or sound-a-like substitutions may have occurred due to the inherent limitations of voice recognition software  This visit occurred during the SARS-CoV-2 public health emergency.  Safety protocols were in place, including screening questions prior to the visit, additional usage of staff PPE, and extensive cleaning of exam room while observing appropriate contact time as indicated for disinfecting solutions.

## 2022-06-01 ENCOUNTER — Other Ambulatory Visit: Payer: Self-pay | Admitting: Family Medicine

## 2022-06-01 ENCOUNTER — Other Ambulatory Visit: Payer: Self-pay | Admitting: Cardiovascular Disease

## 2022-06-01 ENCOUNTER — Ambulatory Visit (INDEPENDENT_AMBULATORY_CARE_PROVIDER_SITE_OTHER)
Admission: RE | Admit: 2022-06-01 | Discharge: 2022-06-01 | Disposition: A | Discharge status: Home / Self Care | Payer: Medicare Other | Source: Ambulatory Visit | Attending: Family Medicine | Admitting: Family Medicine

## 2022-06-01 DIAGNOSIS — R0602 Shortness of breath: Secondary | ICD-10-CM | POA: Diagnosis not present

## 2022-06-01 DIAGNOSIS — R0989 Other specified symptoms and signs involving the circulatory and respiratory systems: Secondary | ICD-10-CM | POA: Diagnosis not present

## 2022-06-01 DIAGNOSIS — R053 Chronic cough: Secondary | ICD-10-CM | POA: Diagnosis not present

## 2022-06-01 DIAGNOSIS — E039 Hypothyroidism, unspecified: Secondary | ICD-10-CM

## 2022-06-01 DIAGNOSIS — R059 Cough, unspecified: Secondary | ICD-10-CM | POA: Diagnosis not present

## 2022-06-01 DIAGNOSIS — I5032 Chronic diastolic (congestive) heart failure: Secondary | ICD-10-CM

## 2022-06-01 LAB — TSH: TSH: 6.9 u[IU]/mL — ABNORMAL HIGH (ref 0.35–5.50)

## 2022-06-01 LAB — COMPREHENSIVE METABOLIC PANEL
ALT: 8 U/L (ref 0–35)
AST: 13 U/L (ref 0–37)
Albumin: 3.8 g/dL (ref 3.5–5.2)
Alkaline Phosphatase: 66 U/L (ref 39–117)
BUN: 32 mg/dL — ABNORMAL HIGH (ref 6–23)
CO2: 29 mEq/L (ref 19–32)
Calcium: 9.3 mg/dL (ref 8.4–10.5)
Chloride: 101 mEq/L (ref 96–112)
Creatinine, Ser: 1.29 mg/dL — ABNORMAL HIGH (ref 0.40–1.20)
GFR: 37.03 mL/min — ABNORMAL LOW (ref >60.00)
Glucose, Bld: 98 mg/dL (ref 70–99)
Potassium: 4.4 mEq/L (ref 3.5–5.1)
Sodium: 139 mEq/L (ref 135–145)
Total Bilirubin: 0.2 mg/dL (ref 0.2–1.2)
Total Protein: 6.8 g/dL (ref 6.0–8.3)

## 2022-06-01 LAB — PRO B NATRIURETIC PEPTIDE: NT-Pro BNP: 833 pg/mL — ABNORMAL HIGH (ref 0–738)

## 2022-06-01 MED ORDER — FUROSEMIDE 40 MG PO TABS: 40.0000 mg | ORAL_TABLET | Freq: Two times a day (BID) | ORAL | 11 refills | Status: DC

## 2022-06-01 MED ORDER — LEVOTHYROXINE SODIUM 25 MCG PO TABS: 50.0000 ug | ORAL_TABLET | Freq: Every day | ORAL | 3 refills | Status: DC

## 2022-06-02 ENCOUNTER — Telehealth: Payer: Self-pay | Admitting: Cardiovascular Disease

## 2022-06-02 MED ORDER — VALSARTAN-HYDROCHLOROTHIAZIDE 160-25 MG PO TABS: 1.0000 | ORAL_TABLET | Freq: Every day | ORAL | 3 refills | Status: DC

## 2022-06-02 NOTE — Telephone Encounter (Signed)
*  STAT* If patient is at the pharmacy, call can be transferred to refill team.   1. Which medications need to be refilled? (please list name of each medication and dose if known)   valsartan-hydrochlorothiazide (DIOVAN-HCT) 160-25 MG tablet    2. Which pharmacy/location (including street and city if local pharmacy) is medication to be sent to?  [facility] [place] [facility], [place] - [address]    3. Do they need a 30 day or 90 day supply?  90 day

## 2022-06-03 NOTE — Progress Notes (Signed)
Mild increased interstitium, fluid overload. Stable overall.

## 2022-06-08 ENCOUNTER — Encounter: Payer: Self-pay | Admitting: Family Medicine

## 2022-06-09 ENCOUNTER — Ambulatory Visit: Payer: Medicare Other | Admitting: Family Medicine

## 2022-06-22 ENCOUNTER — Other Ambulatory Visit: Payer: Self-pay

## 2022-06-22 ENCOUNTER — Telehealth: Payer: Self-pay | Admitting: Family Medicine

## 2022-06-22 DIAGNOSIS — E039 Hypothyroidism, unspecified: Secondary | ICD-10-CM

## 2022-06-22 MED ORDER — POTASSIUM CHLORIDE CRYS ER 20 MEQ PO TBCR: 20.0000 meq | EXTENDED_RELEASE_TABLET | Freq: Every day | ORAL | 0 refills | Status: DC

## 2022-06-22 MED ORDER — FUROSEMIDE 40 MG PO TABS: 40.0000 mg | ORAL_TABLET | Freq: Two times a day (BID) | ORAL | 11 refills | Status: DC

## 2022-06-22 MED ORDER — LEVOTHYROXINE SODIUM 25 MCG PO TABS: 50.0000 ug | ORAL_TABLET | Freq: Every day | ORAL | 3 refills | Status: DC

## 2022-06-22 NOTE — Telephone Encounter (Signed)
Pt states her medication was increased by daily dosage and pharmacy will not refill.   Encourage patient to contact the pharmacy for refills or they can request refills through [facility]:  05/31/22  NEXT APPOINTMENT DATE: 10/27/22  MEDICATION:  potassium chloride SA (KLOR-CON M20) 20 MEQ tablet    furosemide (LASIX) 40 MG tablet    levothyroxine (SYNTHROID) 25 MCG tablet     Is the patient out of medication?   [facility] [place], [place] Phone: [phone]  Fax: [phone]      Let patient know to contact pharmacy at the end of the day to make sure medication is ready.  Please notify patient to allow 48-72 hours to process

## 2022-06-22 NOTE — Telephone Encounter (Signed)
Rx sent 

## 2022-06-24 ENCOUNTER — Other Ambulatory Visit: Payer: Self-pay

## 2022-06-24 DIAGNOSIS — E039 Hypothyroidism, unspecified: Secondary | ICD-10-CM

## 2022-06-24 MED ORDER — LEVOTHYROXINE SODIUM 25 MCG PO TABS: 50.0000 ug | ORAL_TABLET | Freq: Every day | ORAL | 3 refills | Status: DC

## 2022-06-24 NOTE — Telephone Encounter (Signed)
Patient states Pharmacy has not received RX for levothyroxine (SYNTHROID) 25 MCG tablet (showing as a No Print)  Requests the above RX be sent asap.

## 2022-06-24 NOTE — Telephone Encounter (Signed)
Rx resent.

## 2022-06-28 ENCOUNTER — Ambulatory Visit (INDEPENDENT_AMBULATORY_CARE_PROVIDER_SITE_OTHER): Payer: Medicare Other | Admitting: Family Medicine

## 2022-06-28 VITALS — BP 130/58 | HR 87 | Temp 98.7°F | Ht 59.0 in | Wt 149.2 lb

## 2022-06-28 DIAGNOSIS — I5032 Chronic diastolic (congestive) heart failure: Secondary | ICD-10-CM

## 2022-06-28 DIAGNOSIS — I495 Sick sinus syndrome: Secondary | ICD-10-CM | POA: Diagnosis not present

## 2022-06-28 DIAGNOSIS — I4719 Other supraventricular tachycardia: Secondary | ICD-10-CM

## 2022-06-28 DIAGNOSIS — R600 Localized edema: Secondary | ICD-10-CM

## 2022-06-28 DIAGNOSIS — N1831 Chronic kidney disease, stage 3a: Secondary | ICD-10-CM | POA: Diagnosis not present

## 2022-06-28 DIAGNOSIS — I10 Essential (primary) hypertension: Secondary | ICD-10-CM | POA: Diagnosis not present

## 2022-06-28 NOTE — Progress Notes (Signed)
Subjective  CC:  Chief Complaint  Patient presents with   Chronic diastolic heart failure    Pt here for a F/U appt    Edema    HPI: [name] is a 89 y.o. female who presents to the office today to address the problems listed above in the chief complaint. Heart failure: Chronic lower extremity edema, venous insufficiency with chronic kidney disease: New but working to improve this.  We have increased Lasix to 40 twice daily with potassium 20 twice daily.  Fortunately, her weight has decreased 7 pounds in the last month.  She is responsive to the higher dose diuretic.  Legs still with some swelling but no longer red or weeping.  No longer painful.  Reviewed lab work from last visit showing elevated BNP.  Chest x-ray showed mild interstitial prominence but no interstitial edema.  She denies palpitations or chest pain or shortness of breath.  She does have nagging cough.  She is taking an ARB.  Assessment  1. Chronic diastolic heart failure ([place])   2. Essential hypertension   3. SSS (sick sinus syndrome) ([place])   4. PAT (paroxysmal atrial tachycardia)   5. Stage 3a chronic kidney disease (CKD) ([place])   6. Lower extremity edema      Plan   Hypertension f/u: BP control is well controlled.  May be able to decrease dose of diuretic or stop it completely if blood pressures remain normal range.  She will follow-up with cardiology.  Continue heart failure medications. Heart failure: Continue Lasix 40 twice daily and potassium twice daily.  Will recheck renal function and potassium today. Elevate legs, low-sodium diet. Overall improved  Education regarding management of these chronic disease states was given. Management strategies discussed on successive visits include dietary and exercise recommendations, goals of achieving and maintaining IBW, and lifestyle modifications aiming for adequate sleep and minimizing stressors.   Follow up: 6 months for recheck  Orders Placed This Encounter   Procedures   Basic metabolic panel   Magnesium   Pro b natriuretic peptide   No orders of the defined types were placed in this encounter.     BP Readings from Last 3 Encounters:  06/28/22 (!) 130/58  05/31/22 138/72  05/21/22 131/71   Wt Readings from Last 3 Encounters:  06/28/22 149 lb 3.2 oz (67.7 kg)  05/31/22 156 lb 9.6 oz (71 kg)  05/21/22 151 lb 6 oz (68.7 kg)    Lab Results  Component Value Date   CHOL 134 04/30/2022   CHOL 144 04/27/2021   CHOL 136 03/13/2015   Lab Results  Component Value Date   HDL 54.80 04/30/2022   HDL 76.10 04/27/2021   HDL 57 03/13/2015   Lab Results  Component Value Date   LDLCALC 59 04/30/2022   LDLCALC 57 04/27/2021   LDLCALC 60 03/13/2015   Lab Results  Component Value Date   TRIG 100.0 04/30/2022   TRIG 55.0 04/27/2021   TRIG 95 03/13/2015   Lab Results  Component Value Date   CHOLHDL 2 04/30/2022   CHOLHDL 2 04/27/2021   CHOLHDL 2.4 03/13/2015   No results found for: "LDLDIRECT" Lab Results  Component Value Date   CREATININE 1.29 (H) 05/31/2022   BUN 32 (H) 05/31/2022   NA 139 05/31/2022   K 4.4 05/31/2022   CL 101 05/31/2022   CO2 29 05/31/2022    The ASCVD Risk score (Arnett DK, et al., 2019) failed to calculate for the following reasons:  The 2019 ASCVD risk score is only valid for ages 39 to 27  I reviewed the patients updated PMH, FH, and SocHx.    Patient Active Problem List   Diagnosis Date Noted   Chronic diastolic heart failure ([place]) 03/03/2022    Priority: High   Stage 3a chronic kidney disease (CKD) ([place]) 04/27/2021    Priority: High   SSS (sick sinus syndrome) (HCC) 06/15/2017    Priority: High   PAT (paroxysmal atrial tachycardia) 06/15/2017    Priority: High   Impaired fasting glucose 07/27/2016    Priority: High   CHB (complete heart block) ([place]) s/p pacemaker 05/17/2013    Priority: High   Pacemaker 02/04/2013    Priority: High   Essential hypertension 02/04/2013    Priority:  High   Mixed hyperlipidemia 02/04/2013    Priority: High   Osteopenia 04/27/2021    Priority: Medium    Adjustment disorder with anxiety 04/27/2021    Priority: Medium    Early stage nonexudative age-related macular degeneration of right eye 03/06/2020    Priority: Medium    Early stage nonexudative age-related macular degeneration of left eye 03/06/2020    Priority: Medium    Laryngopharyngeal reflux (LPR) 03/11/2017    Priority: Medium    Primary hypothyroidism 07/27/2016    Priority: Medium    Lower extremity edema 11/22/2014    Priority: Medium    Asthma 02/20/2010    Priority: Medium    GERD (gastroesophageal reflux disease) 10/26/2007    Priority: Medium    Chronic cough 10/26/2007    Priority: Medium    Choroidal nevus of right eye 03/06/2020    Priority: Low   Pseudophakia 03/06/2020    Priority: Low   Vitamin D deficiency 07/27/2016    Priority: Low   Allergic rhinitis due to pollen 08/21/2010    Priority: Low   Venous insufficiency of both lower extremities 05/31/2022   Primary insomnia 10/28/2021   Normocytic anemia 07/14/2021    Allergies: Fluticasone-salmeterol, Sulfa antibiotics, Sulfonamide derivatives, Tetracycline hcl, Benzonatate, Erythromycin, Meloxicam, and Sulfamethoxazole  Social History: Patient  reports that she has never smoked. She has never used smokeless tobacco. She reports that she does not drink alcohol and does not use drugs.  Current Meds  Medication Sig   Acetaminophen (TYLENOL) 325 MG CAPS Tylenol   acetaminophen (TYLENOL) 500 MG tablet 1000 mg by oral route.   Ascorbic Acid (VITAMIN C) 100 MG tablet 500 mg daily.   aspirin 81 MG chewable tablet Chew 81 mg by mouth at bedtime.   calcium carbonate (TUMS EX) 750 MG chewable tablet Chew 1 tablet by mouth daily as needed for heartburn.   cetirizine (ZYRTEC) 10 MG tablet    cholecalciferol (VITAMIN D3) 25 MCG (1000 UNIT) tablet Take 1 tablet (1,000 Units total) by mouth daily.    conjugated estrogens (PREMARIN) vaginal cream    cromolyn (OPTICROM) 4 % ophthalmic solution INSTILL 1 DROP INTO BOTH EYES 4 TIMES A DAY   diltiazem (CARDIZEM CD) 180 MG 24 hr capsule Take by mouth.   docusate sodium (COLACE) 100 MG capsule Take 1 capsule (100 mg total) by mouth 2 (two) times daily.   furosemide (LASIX) 40 MG tablet Take 1 tablet (40 mg total) by mouth 2 (two) times daily.   hydrOXYzine (ATARAX) 10 MG tablet Take 1 tablet (10 mg total) by mouth 3 (three) times daily as needed for itching.   levothyroxine (SYNTHROID) 25 MCG tablet Take 2 tablets (50 mcg total) by mouth daily.  lidocaine (LIDODERM) 5 %    LORazepam (ATIVAN) 0.5 MG tablet TAKE 1 TABLET BY MOUTH EVERY DAY AS NEEDED FOR ANXIETY   melatonin 3 MG TABS tablet 3 mg by oral route.   methocarbamol (ROBAXIN) 500 MG tablet Take 1 tablet (500 mg total) by mouth every 8 (eight) hours as needed for muscle spasms.   metoprolol succinate (TOPROL-XL) 25 MG 24 hr tablet Take 1 tablet (25 mg total) by mouth at bedtime.   pantoprazole (PROTONIX) 20 MG tablet Take 1 tablet (20 mg total) by mouth daily.   potassium chloride SA (KLOR-CON M20) 20 MEQ tablet Take 1 tablet (20 mEq total) by mouth daily. Take an extra tablet once daily when you take the extra furosemide   pravastatin (PRAVACHOL) 40 MG tablet TAKE 1 TABLET BY MOUTH EVERY DAY   tiZANidine (ZANAFLEX) 2 MG tablet tizanidine 2 mg tablet  Take 1 tablet 3 times a day by oral route as needed.   traMADol (ULTRAM) 50 MG tablet Take 1 tablet twice a day by oral route as needed.   traZODone (DESYREL) 50 MG tablet Take 0.5-1 tablets (25-50 mg total) by mouth at bedtime as needed for sleep.   triamcinolone cream (KENALOG) 0.1 % Apply 1 Application topically 2 (two) times daily. For 2 weeks, then as needed   valsartan-hydrochlorothiazide (DIOVAN-HCT) 160-25 MG tablet Take 1 tablet by mouth daily.   [DISCONTINUED] doxycycline (VIBRAMYCIN) 100 MG capsule Take 1 capsule (100 mg total) by  mouth 2 (two) times daily.    Review of Systems: Cardiovascular: negative for chest pain, palpitations, leg swelling, orthopnea Respiratory: negative for SOB, wheezing or persistent cough Gastrointestinal: negative for abdominal pain Genitourinary: negative for dysuria or gross hematuria  Objective  Vitals: BP (!) 130/58   Pulse 87   Temp 98.7 F (37.1 C)   Ht '4\' 11"'$  (1.499 m)   Wt 149 lb 3.2 oz (67.7 kg)   SpO2 98%   BMI 30.13 kg/m  General: no acute distress  Psych:  Alert and oriented, normal mood and affect HEENT:  Normocephalic, atraumatic, supple neck  Cardiovascular:  RRR without murmur. no edema Respiratory: No rales today Skin:  Warm, no rashes Lower extremities: +1 pitting edema bilaterally to knees, chronic skin changes from edema but no erythema Neurologic:   Mental status is normal Commons side effects, risks, benefits, and alternatives for medications and treatment plan prescribed today were discussed, and the patient expressed understanding of the given instructions. Patient is instructed to call or message via MyChart if he/she has any questions or concerns regarding our treatment plan. No barriers to understanding were identified. We discussed Red Flag symptoms and signs in detail. Patient expressed understanding regarding what to do in case of urgent or emergency type symptoms.  Medication list was reconciled, printed and provided to the patient in AVS. Patient instructions and summary information was reviewed with the patient as documented in the AVS. This note was prepared with assistance of Dragon voice recognition software. Occasional wrong-word or sound-a-like substitutions may have occurred due to the inherent limitation

## 2022-06-28 NOTE — Patient Instructions (Signed)
Please return in 6 months for recheck ° °If you have any questions or concerns, please don't hesitate to send me a message via MyChart or call the office at [phone]. Thank you for visiting with us today! It's our pleasure caring for you.  °

## 2022-06-29 LAB — BASIC METABOLIC PANEL
BUN: 45 mg/dL — ABNORMAL HIGH (ref 6–23)
CO2: 30 mEq/L (ref 19–32)
Calcium: 9.2 mg/dL (ref 8.4–10.5)
Chloride: 101 mEq/L (ref 96–112)
Creatinine, Ser: 1.54 mg/dL — ABNORMAL HIGH (ref 0.40–1.20)
GFR: 29.92 mL/min — ABNORMAL LOW (ref >60.00)
Glucose, Bld: 108 mg/dL — ABNORMAL HIGH (ref 70–99)
Potassium: 4.5 mEq/L (ref 3.5–5.1)
Sodium: 143 mEq/L (ref 135–145)

## 2022-06-29 LAB — PRO B NATRIURETIC PEPTIDE: NT-Pro BNP: 515 pg/mL (ref 0–738)

## 2022-06-29 LAB — MAGNESIUM: Magnesium: 2.2 mg/dL (ref 1.5–2.5)

## 2022-07-01 ENCOUNTER — Other Ambulatory Visit: Payer: Self-pay | Admitting: *Deleted

## 2022-07-01 ENCOUNTER — Encounter: Payer: Self-pay | Admitting: Cardiovascular Disease

## 2022-07-01 ENCOUNTER — Ambulatory Visit: Payer: Medicare Other | Attending: Cardiovascular Disease | Admitting: Cardiovascular Disease

## 2022-07-01 VITALS — BP 122/52 | HR 74 | Ht 59.0 in | Wt 148.4 lb

## 2022-07-01 DIAGNOSIS — I10 Essential (primary) hypertension: Secondary | ICD-10-CM | POA: Diagnosis not present

## 2022-07-01 DIAGNOSIS — I4719 Other supraventricular tachycardia: Secondary | ICD-10-CM | POA: Diagnosis not present

## 2022-07-01 DIAGNOSIS — N179 Acute kidney failure, unspecified: Secondary | ICD-10-CM | POA: Insufficient documentation

## 2022-07-01 DIAGNOSIS — E7800 Pure hypercholesterolemia, unspecified: Secondary | ICD-10-CM | POA: Insufficient documentation

## 2022-07-01 DIAGNOSIS — I495 Sick sinus syndrome: Secondary | ICD-10-CM | POA: Diagnosis not present

## 2022-07-01 DIAGNOSIS — N1831 Chronic kidney disease, stage 3a: Secondary | ICD-10-CM | POA: Diagnosis not present

## 2022-07-01 DIAGNOSIS — I442 Atrioventricular block, complete: Secondary | ICD-10-CM

## 2022-07-01 DIAGNOSIS — Z950 Presence of cardiac pacemaker: Secondary | ICD-10-CM | POA: Insufficient documentation

## 2022-07-01 DIAGNOSIS — I5032 Chronic diastolic (congestive) heart failure: Secondary | ICD-10-CM | POA: Insufficient documentation

## 2022-07-01 DIAGNOSIS — Z8679 Personal history of other diseases of the circulatory system: Secondary | ICD-10-CM | POA: Insufficient documentation

## 2022-07-01 MED ORDER — VALSARTAN 160 MG PO TABS: 160.0000 mg | ORAL_TABLET | Freq: Every day | ORAL | 3 refills | Status: DC

## 2022-07-01 NOTE — Patient Instructions (Addendum)
Medication Instructions:  STOP VALSARTAN-HCTZ 160-25 START Valsartan 160 mg daily Check labs (BMET) in 3 weeks  *If you need a refill on your cardiac medications before your next appointment, please call your pharmacy*  Follow-Up: At [facility], you and your health needs are our priority.  As part of our continuing mission to provide you with exceptional heart care, we have created designated Provider Care Teams.  These Care Teams include your primary Cardiologist (physician) and Advanced Practice Providers (APPs -  Physician Assistants and Nurse Practitioners) who all work together to provide you with the care you need, when you need it.  We recommend signing up for the patient portal called "MyChart".  Sign up information is provided on this After Visit Summary.  MyChart is used to connect with patients for Virtual Visits (Telemedicine).  Patients are able to view lab/test results, encounter notes, upcoming appointments, etc.  Non-urgent messages can be sent to your provider as well.   To learn more about what you can do with MyChart, go to [email or web address].    Your next appointment:   6 month(s)  Provider:   Dr. Sallyanne Kuster

## 2022-07-01 NOTE — Progress Notes (Signed)
Patient ID: [name], female   DOB: [birth year], 89 y.o.   MRN: [id number]    Cardiology Office Note    Date:  07/01/2022   ID:  [name], DOB 01/23/1932, MRN [id number]  PCP:  Leamon Arnt, MD  Cardiologist: Shelva Majestic, M.D.;  Sanda Klein, MD   Chief Complaint  Patient presents with   Pacemaker Check    History of Present Illness:  [name] is a 89 y.o. female who returns in follow-up for sinus node dysfunction, complete heart block, paroxysmal atrial tachycardia and pacemaker.    Although she complains of a lot of aches and pains, she has not had any cardiovascular events in the last 12 months.  She has discomfort in her left chest that is clearly positional and she is tender to touch over the lateral left chest.  She does not recall any injuries.  She does not have exertional chest discomfort or shortness of breath.  She denies orthopnea, PND,  new focal neurological deficits, palpitations, syncope, falls or bleeding problems.    She has intermittent worsening of lower extremity edema.  Dr. Jonni Sanger saw her in early December and was concerned for heart failure.  The patient now takes twice daily furosemide.  She also takes valsartan-hydrochlorothiazide.  She had normal electrolytes on labs checked just 3 days ago, but her creatinine was mildly elevated at 1.54, above what appears to be her baseline of 1.2-1.3.  NT proBNP was also checked and was in normal range at 515 (previously elevated at 833 about a month ago).  If our scales are comparable, she weighs 8 pounds less on our scale today than she did a month ago.  She was hospitalized on July 13, 2021 with bilateral lower extremity edema and hip pain and was found to have a bilateral superior pubic ramus fracture.    She continues to have intermittent problems with lower extremity edema.  The couple of days ago this was almost completely resolved, but this morning she has some swelling again.  She denies shortness of  breath at rest or with activity.  She does not have chest pain, palpitations, dizziness or syncope.  Echocardiogram in June 2023 showed normal left ventricular systolic function and no significant valvular abnormalities.  Presenting rhythm today is AV sequential pacing with a broad QRS at 160 ms.  QTc 492 ms.  I am not sure why, but we have not received any pacemaker downloads in the last 12 months.  Pacemaker anterior today shows normal device function.  Anticipated generator longevity is 3.6 is 3.8 years and all the lead parameters are normal.  She has 63% atrial pacing with a rather blunted histogram and has 100% ventricular pacing.  The burden of atrial mode switch is under 1% and she has not had any sustained events since September 07, 2021.  At that time she had a 3-hour episode of atrial tachyarrhythmia at about 313 bpm, but the electrocardiogram is not available for review.  Her initial pacemaker was implanted for complete heart block in 2009.  She underwent pacemaker change out for ERI in August 2018 ([place] MRI, but leads are from 2009, not MRI conditional).  She has developed symptoms of sinus node dysfunction as well.  Metabolic control is good with a recent hemoglobin A1c of 6.1% and LDL 59.   Past Medical History:  Diagnosis Date   Allergic rhinitis    Anginal pain (HCC)    Asthmatic bronchitis    Chronic cough  Dysrhythmia    PAF   Esophageal reflux    GERD (gastroesophageal reflux disease)    History of stress test 11/20/2009   This was essentially normal,post-stress EF was hyperdynamic at 73 beats per mins, There was no scar or ischemia.   Hx of echocardiogram 11/05/2010   EF>55% showed a normal systolic function with impaired diastolic dysfuntion and also tissue doppler suggesting increased elevated left atrial pressure, she had moderate LA dilatation and mild LA dilatation. There was calcified mitral apparatus with moderate MR, mild to moderate TR and trace  aortic insufficiency.   Hyperlipemia    Hypertension    Hypothyroidism    PAF (paroxysmal atrial fibrillation) ([place])    Pneumonia     Past Surgical History:  Procedure Laterality Date   CHOLECYSTECTOMY     CYSTOSCOPY WITH BIOPSY N/A 08/29/2020   Procedure: CYSTOSCOPY WITH BIOPSY WITH FULGURATION;  Surgeon: Irine Seal, MD;  Location: [facility];  Service: Urology;  Laterality: N/A;   FOOT SURGERY     right   NASAL SEPTUM SURGERY     PACEMAKER INSERTION  June 1st 2009   [employer] [place] device, model #5826, serial #[id number], The artial lead is a Interior and spatial designer, the ventricular lead is Environmental education officer.   PPM GENERATOR CHANGEOUT N/A 02/09/2017   Procedure: PPM GENERATOR CHANGEOUT;  Surgeon: Sanda Klein, MD;  Location: [facility];  Service: Cardiovascular;  Laterality: N/A;   THYROID SURGERY     TONSILLECTOMY     TONSILLECTOMY     VESICOVAGINAL FISTULA CLOSURE W/ TAH      Current Outpatient Medications  Medication Sig Dispense Refill   Ascorbic Acid (VITAMIN C) 100 MG tablet 500 mg daily.     aspirin 81 MG chewable tablet Chew 81 mg by mouth at bedtime.     cetirizine (ZYRTEC) 10 MG tablet      cholecalciferol (VITAMIN D3) 25 MCG (1000 UNIT) tablet Take 1 tablet (1,000 Units total) by mouth daily. 30 tablet 0   conjugated estrogens (PREMARIN) vaginal cream      cromolyn (OPTICROM) 4 % ophthalmic solution INSTILL 1 DROP INTO BOTH EYES 4 TIMES A DAY     diltiazem (CARDIZEM CD) 120 MG 24 hr capsule Take 120 mg by mouth daily.     docusate sodium (COLACE) 100 MG capsule Take 1 capsule (100 mg total) by mouth 2 (two) times daily. 10 capsule 0   furosemide (LASIX) 40 MG tablet Take 1 tablet (40 mg total) by mouth 2 (two) times daily. 120 tablet 11   levothyroxine (SYNTHROID) 25 MCG tablet Take 2 tablets (50 mcg total) by mouth daily. 180 tablet 3   LORazepam (ATIVAN) 0.5 MG tablet TAKE 1 TABLET BY MOUTH EVERY DAY AS NEEDED FOR ANXIETY 30 tablet 2   metoprolol succinate (TOPROL-XL) 25 MG  24 hr tablet Take 1 tablet (25 mg total) by mouth at bedtime. 90 tablet 3   pantoprazole (PROTONIX) 20 MG tablet Take 1 tablet (20 mg total) by mouth daily. 90 tablet 3   potassium chloride SA (KLOR-CON M20) 20 MEQ tablet Take 1 tablet (20 mEq total) by mouth daily. Take an extra tablet once daily when you take the extra furosemide 90 tablet 0   pravastatin (PRAVACHOL) 40 MG tablet TAKE 1 TABLET BY MOUTH EVERY DAY 90 tablet 3   valsartan (DIOVAN) 160 MG tablet Take 1 tablet (160 mg total) by mouth daily. 90 tablet 3   Acetaminophen (TYLENOL) 325 MG CAPS Tylenol (Patient not  taking: Reported on 07/01/2022)     acetaminophen (TYLENOL) 500 MG tablet 1000 mg by oral route. (Patient not taking: Reported on 07/01/2022)     calcium carbonate (TUMS EX) 750 MG chewable tablet Chew 1 tablet by mouth daily as needed for heartburn. (Patient not taking: Reported on 07/01/2022)     diltiazem (CARDIZEM CD) 180 MG 24 hr capsule Take by mouth. (Patient not taking: Reported on 07/01/2022)     hydrOXYzine (ATARAX) 10 MG tablet Take 1 tablet (10 mg total) by mouth 3 (three) times daily as needed for itching. (Patient not taking: Reported on 07/01/2022) 30 tablet 0   lidocaine (LIDODERM) 5 %  (Patient not taking: Reported on 07/01/2022)     melatonin 3 MG TABS tablet 3 mg by oral route. (Patient not taking: Reported on 07/01/2022)     methocarbamol (ROBAXIN) 500 MG tablet Take 1 tablet (500 mg total) by mouth every 8 (eight) hours as needed for muscle spasms. (Patient not taking: Reported on 07/01/2022) 30 tablet 0   tiZANidine (ZANAFLEX) 2 MG tablet tizanidine 2 mg tablet  Take 1 tablet 3 times a day by oral route as needed. (Patient not taking: Reported on 07/01/2022)     traMADol (ULTRAM) 50 MG tablet Take 1 tablet twice a day by oral route as needed. (Patient not taking: Reported on 07/01/2022)     traZODone (DESYREL) 50 MG tablet Take 0.5-1 tablets (25-50 mg total) by mouth at bedtime as needed for sleep. (Patient not taking:  Reported on 07/01/2022) 30 tablet 5   triamcinolone cream (KENALOG) 0.1 % Apply 1 Application topically 2 (two) times daily. For 2 weeks, then as needed (Patient not taking: Reported on 07/01/2022) 45 g 0   No current facility-administered medications for this visit.    Allergies:   Fluticasone-salmeterol, Sulfa antibiotics, Sulfonamide derivatives, Tetracycline hcl, Benzonatate, Erythromycin, Meloxicam, and Sulfamethoxazole   Social History   Socioeconomic History   Marital status: Widowed    Spouse name: Not on file   Number of children: Not on file   Years of education: Not on file   Highest education level: Not on file  Occupational History    Employer: RETIRED    Comment: Retired Sales promotion account executive at [employer]  Tobacco Use   Smoking status: Never   Smokeless tobacco: Never  Vaping Use   Vaping Use: Never used  Substance and Sexual Activity   Alcohol use: No   Drug use: No   Sexual activity: Not on file  Other Topics Concern   Not on file  Social History Narrative   Not on file   Social Determinants of Health   Financial Resource Strain: [place]  (10/27/2021)   Overall Financial Resource Strain (CARDIA)    Difficulty of Paying Living Expenses: Not hard at all  Food Insecurity: No Food Insecurity (10/27/2021)   Hunger Vital Sign    Worried About Running Out of Food in the Last Year: Never true    [place] in the Last Year: Never true  Transportation Needs: No Transportation Needs (10/27/2021)   PRAPARE - Hydrologist (Medical): No    Lack of Transportation (Non-Medical): No  Physical Activity: Insufficiently Active (10/27/2021)   Exercise Vital Sign    Days of Exercise per Week: 5 days    Minutes of Exercise per Session: 10 min  Stress: No Stress Concern Present (10/27/2021)   [place]  Feeling of Stress : Not at all  Social Connections: Socially Isolated  (10/27/2021)   Social Connection and Isolation Panel [NHANES]    Frequency of Communication with Friends and Family: More than three times a week    Frequency of Social Gatherings with Friends and Family: Twice a week    Attends Religious Services: Never    Marine scientist or Organizations: No    Attends Archivist Meetings: Never    Marital Status: Widowed     Family History:  The patient's family history includes Cancer in her father; Stroke in her mother and sister.   ROS:   Please see the history of present illness.    Review of Systems  Musculoskeletal:  Positive for arthritis, back pain, muscle cramps and stiffness.   All other systems reviewed and are negative.   PHYSICAL EXAM:   VS:  BP (!) 122/52 (BP Location: Left Arm, Patient Position: Sitting, Cuff Size: Normal)   Pulse 74   Ht '4\' 11"'$  (1.499 m)   Wt 148 lb 6.4 oz (67.3 kg)   SpO2 98%   BMI 29.97 kg/m      General: Alert, oriented x3, no distress, Healthy left subclavian pacemaker site Head: no evidence of trauma, PERRL, EOMI, no exophtalmos or lid lag, no myxedema, no xanthelasma; normal ears, nose and oropharynx Neck: normal jugular venous pulsations and no hepatojugular reflux; brisk carotid pulses without delay and no carotid bruits Chest: clear to auscultation, no signs of consolidation by percussion or palpation, normal fremitus, symmetrical and full respiratory excursions Cardiovascular: normal position and quality of the apical impulse, regular rhythm, normal first and second heart sounds, no murmurs, rubs or gallops Abdomen: no tenderness or distention, no masses by palpation, no abnormal pulsatility or arterial bruits, normal bowel sounds, no hepatosplenomegaly Extremities: 1+ symmetrical calf edema, some varicose veins; 2+ radial, ulnar and brachial pulses bilaterally; 2+ right femoral, posterior tibial and dorsalis pedis pulses; 2+ left femoral, posterior tibial and dorsalis pedis pulses; no  subclavian or femoral bruits Neurological: grossly nonfocal Psych: Normal mood and affect     Wt Readings from Last 3 Encounters:  07/01/22 148 lb 6.4 oz (67.3 kg)  06/28/22 149 lb 3.2 oz (67.7 kg)  05/31/22 156 lb 9.6 oz (71 kg)      Studies/Labs Reviewed:   ECHO 07/15/2021   1. Left ventricular ejection fraction, by estimation, is 60 to 65%. The left ventricle has normal function. The left ventricle has no regional wall motion abnormalities. There is mild left ventricular hypertrophy. Left ventricular diastolic parameters are consistent with Grade I diastolic dysfunction (impaired relaxation).   2. Right ventricular systolic function is normal. The right ventricular size is normal. The estimated right ventricular systolic pressure is 09.3 mmHg.   3. Left atrial size was mildly dilated.   4. The mitral valve is normal in structure. Trivial mitral valve  regurgitation. No evidence of mitral stenosis.   5. The aortic valve is tricuspid. Aortic valve regurgitation is not  visualized. No aortic stenosis is present.   6. The inferior vena cava is normal in size with greater than 50%  respiratory variability, suggesting right atrial pressure of 3 mmHg.   EKG:  EKG is not ordered today.  Review of the ECG from 07/15/2021 shows atrial sensed (sinus), ventricular paced rhythm.  Recent Labs: 07/13/2021: B Natriuretic Peptide 23.5 05/21/2022: Hemoglobin 10.4; Platelets 278 05/31/2022: ALT 8; TSH 6.90 06/28/2022: BUN 45; Creatinine, Ser 1.54; Magnesium 2.2; NT-Pro BNP 515;  Potassium 4.5; Sodium 143    Lipid Panel    Component Value Date/Time   CHOL 134 04/30/2022 1211   TRIG 100.0 04/30/2022 1211   HDL 54.80 04/30/2022 1211   CHOLHDL 2 04/30/2022 1211   VLDL 20.0 04/30/2022 1211   LDLCALC 59 04/30/2022 1211    ASSESSMENT:    1. Chronic diastolic heart failure ([place])   2. CHB (complete heart block) (HCC)   3. SSS (sick sinus syndrome) ([place])   4. Pacemaker   5. Acute renal failure  superimposed on stage 3a chronic kidney disease, unspecified acute renal failure type ([place])   6. PAT (paroxysmal atrial tachycardia)   7. History of atrial fibrillation   8. Essential hypertension   9. Hypercholesteremia        PLAN:  In order of problems listed above:  CHF: Had clinical and biochemical findings consistent with diastolic heart failure exacerbation with worsening edema and elevated proBNP.  Could this possibly be due to increased sodium intake around the holidays?  Improved after Dr. Jonni Sanger increased her dose of diuretic, but her creatinine is a little high now.  Clinically she appears to be euvolemic and her proBNP return to normal range after that intervention.  Echo 1 year ago showed LVEF 60 to 65% and only mild diastolic dysfunction, P/Y'<09,  borderline systolic PA pressure at 31 mmHg.  I think we should stop her hydrochlorothiazide, continue the furosemide.  Consider an SGLT2 inhibitor if problems with hypervolemia recur. CHB: She does not have an underlying escape rhythm.  Pacemaker dependent. SSS: Heart rate histogram distribution is appropriate considering that she is very sedentary. PPM: Normal device function.  Remote downloads every 3 months.  Need to find out why we have not been receiving downloads in the last 12 months. PAT: Rare, brief, asymptomatic events.  On metoprolol and diltiazem. AFib: Recorded as part of her medical record, but had not been seen during pacemaker checks for the last 10 years.  There was an episode on 09/07/2021 with possible atrial flutter or atrial fibrillation that lasted for over 3 hours, but unfortunately electrogram cannot be reviewed.  She is currently not on anticoagulation.  We will need to reconsider atrial fibrillation again. HTN: controlled HLP: Well-controlled on current medications.   Medication Adjustments/Labs and Tests Ordered: Current medicines are reviewed at length with the patient today.  Concerns regarding medicines  are outlined above.  Medication changes, Labs and Tests ordered today are listed below. Patient Instructions  Medication Instructions:  STOP VALSARTAN-HCTZ 160-25 START Valsartan 160 mg daily Check labs (BMET) in 3 weeks  *If you need a refill on your cardiac medications before your next appointment, please call your pharmacy*  Follow-Up: At [facility], you and your health needs are our priority.  As part of our continuing mission to provide you with exceptional heart care, we have created designated Provider Care Teams.  These Care Teams include your primary Cardiologist (physician) and Advanced Practice Providers (APPs -  Physician Assistants and Nurse Practitioners) who all work together to provide you with the care you need, when you need it.  We recommend signing up for the patient portal called "MyChart".  Sign up information is provided on this After Visit Summary.  MyChart is used to connect with patients for Virtual Visits (Telemedicine).  Patients are able to view lab/test results, encounter notes, upcoming appointments, etc.  Non-urgent messages can be sent to your provider as well.   To learn more about what you can do  with MyChart, go to [email or web address].    Your next appointment:   6 month(s)  Provider:   Dr. Sallyanne Kuster    Patient Instructions  Medication Instructions:  STOP VALSARTAN-HCTZ 160-25 START Valsartan 160 mg daily Check labs (BMET) in 3 weeks  *If you need a refill on your cardiac medications before your next appointment, please call your pharmacy*  Follow-Up: At [facility], you and your health needs are our priority.  As part of our continuing mission to provide you with exceptional heart care, we have created designated Provider Care Teams.  These Care Teams include your primary Cardiologist (physician) and Advanced Practice Providers (APPs -  Physician Assistants and Nurse Practitioners) who all work together to provide you with  the care you need, when you need it.  We recommend signing up for the patient portal called "MyChart".  Sign up information is provided on this After Visit Summary.  MyChart is used to connect with patients for Virtual Visits (Telemedicine).  Patients are able to view lab/test results, encounter notes, upcoming appointments, etc.  Non-urgent messages can be sent to your provider as well.   To learn more about what you can do with MyChart, go to [email or web address].    Your next appointment:   6 month(s)  Provider:   Dr. Sallyanne Kuster   Signed, Sanda Klein, MD  07/01/2022 3:34 PM    [address], [place], [place]  [address] Phone: [phone]; Fax: [phone]

## 2022-07-01 NOTE — Progress Notes (Signed)
Please call patient: I have reviewed his/her lab results. Labs show that she is responding to the higher dose of lasix. Continue as is.   (Can't go higher due to renal function, potassium is stable, improved bnp)

## 2022-07-05 ENCOUNTER — Telehealth: Payer: Self-pay

## 2022-07-05 NOTE — Telephone Encounter (Signed)
Croitoru, Mihai, MD sent to [facility] Saw her in clinic today and realized we have not been receiving her downloads in about a year.  This was her first device check since January 2023.  Can we please troubleshoot?  She has complete heart block/pacemaker dependent.  Left detailed message for Pt requesting call back to reestablish remote monitoring.

## 2022-07-07 NOTE — Telephone Encounter (Signed)
LVM on sons phone to call back, direct number to device clinic given. Will also mail letter to patient with instructions on how to send transmission.

## 2022-08-02 ENCOUNTER — Other Ambulatory Visit: Payer: Self-pay | Admitting: Family Medicine

## 2022-08-06 DIAGNOSIS — N179 Acute kidney failure, unspecified: Secondary | ICD-10-CM | POA: Diagnosis not present

## 2022-08-06 DIAGNOSIS — N1831 Chronic kidney disease, stage 3a: Secondary | ICD-10-CM | POA: Diagnosis not present

## 2022-08-07 LAB — BASIC METABOLIC PANEL
BUN/Creatinine Ratio: 22 (ref 12–28)
BUN: 29 mg/dL — ABNORMAL HIGH (ref 8–27)
CO2: 22 mmol/L (ref 20–29)
Calcium: 9 mg/dL (ref 8.7–10.3)
Chloride: 100 mmol/L (ref 96–106)
Creatinine, Ser: 1.31 mg/dL — ABNORMAL HIGH (ref 0.57–1.00)
Glucose: 91 mg/dL (ref 70–99)
Potassium: 4.7 mmol/L (ref 3.5–5.2)
Sodium: 139 mmol/L (ref 134–144)
eGFR: 39 mL/min/{1.73_m2} — ABNORMAL LOW (ref >59)

## 2022-08-09 ENCOUNTER — Encounter: Payer: Self-pay | Admitting: *Deleted

## 2022-08-25 ENCOUNTER — Telehealth: Payer: Self-pay | Admitting: Cardiovascular Disease

## 2022-08-26 ENCOUNTER — Other Ambulatory Visit: Payer: Self-pay | Admitting: Cardiovascular Disease

## 2022-08-26 NOTE — Telephone Encounter (Signed)
*  STAT* If patient is at the pharmacy, call can be transferred to refill team.   1. Which medications need to be refilled? (please list name of each medication and dose if known)   metoprolol succinate (TOPROL-XL) 25 MG 24 hr tablet  KLOR-CON M20 20 MEQ tablet  diltiazem (CARDIZEM CD) 120 MG 24 hr capsule   2. Which pharmacy/location (including street and city if local pharmacy) is medication to be sent to? [facility] [place] [place] - [address]   3. Do they need a 30 day or 90 day supply? 90 day  Patient is out of medication.

## 2022-08-26 NOTE — Telephone Encounter (Signed)
[facility].

## 2022-08-30 ENCOUNTER — Ambulatory Visit (INDEPENDENT_AMBULATORY_CARE_PROVIDER_SITE_OTHER): Payer: Medicare Other | Admitting: Podiatry

## 2022-08-30 DIAGNOSIS — B351 Tinea unguium: Secondary | ICD-10-CM

## 2022-08-30 DIAGNOSIS — M79675 Pain in left toe(s): Secondary | ICD-10-CM | POA: Diagnosis not present

## 2022-08-30 DIAGNOSIS — M79674 Pain in right toe(s): Secondary | ICD-10-CM | POA: Diagnosis not present

## 2022-08-30 NOTE — Progress Notes (Signed)
  Subjective:  Patient ID: [name], female    DOB: [birth year],  MRN: [id number]  [name] presents to clinic today for painful elongated mycotic toenails 1-5 bilaterally which are tender when wearing enclosed shoe gear. Pain is relieved with periodic professional debridement.  Chief Complaint  Patient presents with   Nail Problem    [facility] PCP-Andy, Camillie PCP VST-06/2022?   New problem(s): None.   PCP is Leamon Arnt, MD.  Allergies  Allergen Reactions   Fluticasone-Salmeterol Other (See Comments)    unknown Other reaction(s): tongue/throat symptoms   Sulfa Antibiotics     Other reaction(s): gastric intol   Sulfonamide Derivatives Other (See Comments)    unknown   Tetracycline Hcl    Benzonatate Other (See Comments)    tongue/throat symptoms in the past but tolerated more recently Other reaction(s): and tongue/throat symptoms in the past (not more recently)   Erythromycin Nausea And Vomiting and Other (See Comments)    Stomach cramps Other reaction(s): gastric intol   Meloxicam Other (See Comments)    Mild dyspnea Other reaction(s): dyspnea   Sulfamethoxazole Other (See Comments)    Gastric intolerance    Review of Systems: Negative except as noted in the HPI.  Objective: No changes noted in today's physical examination. There were no vitals filed for this visit.  [name] is a pleasant 89 y.o. female WD, WN in NAD. AAO x 3. Vascular Examination: Vascular status intact b/l with palpable pedal pulses. CFT immediate b/l. No edema. No pain with calf compression b/l. Skin temperature gradient WNL b/l. Pedal hair sparse.  Neurological Examination: Sensation grossly intact b/l with 10 gram monofilament. Vibratory sensation intact b/l.   Dermatological Examination: Pedal skin with normal turgor, texture and tone b/l. Toenails 1-5 b/l thick, discolored, elongated with subungual debris and pain on dorsal palpation. No hyperkeratotic lesions noted b/l.    Musculoskeletal Examination: Muscle strength 5/5 to b/l LE. HAV with bunion deformity noted b/l LE. Great toes abutting against b/l 2nd digits. Patient utilizes walker for ambulation assistance.  Radiographs: None  Assessment/Plan: 1. Pain due to onychomycosis of toenails of both feet    -Consent given for treatment as described below: -Examined patient. -Patient to continue soft, supportive shoe gear daily. -Mycotic toenails 1-5 bilaterally were debrided in length and girth with sterile nail nippers and dremel without incident. -Patient/POA to call should there be question/concern in the interim.   Return in about 3 months (around 11/30/2022).  Marzetta Board, DPM

## 2022-09-02 ENCOUNTER — Telehealth: Payer: Self-pay | Admitting: Family Medicine

## 2022-09-02 NOTE — Telephone Encounter (Signed)
Patient states: - Has been experiencing leakage of clear fluid from legs x 5 days  - Has sore spots on legs  - No other symptoms   Patient has been transferred to triage.

## 2022-09-03 ENCOUNTER — Encounter: Payer: Self-pay | Admitting: Podiatry

## 2022-09-03 NOTE — Telephone Encounter (Signed)
Pt was advised to go to ED  Patient Name: [name] [facility] Gender: Female DOB: April 14, 1932 Age: 89 Y 10 M 8 D Return Phone Number: [phone] (Primary), [phone] (Secondary) Address: City/ State/ Zip: [place]  [address] Client [place] [facility] [place] [place] [facility] [place] [facility] Provider Billey Chang- MD Contact Type Call Who Is Calling Patient / Member / Family / Caregiver Call Type Triage / Clinical Relationship To Patient Self Return Phone Number [phone] (Primary) Chief Complaint Leg Swelling And Edema Reason for Call Symptomatic / Request for Health Information Initial Comment Patient called in stating she's been leaking fluid from her legs for 5 days. Possibly cellulitis. Translation No Nurse Assessment Nurse: Tanya Nones, RN, Jordan Hawks Date/Time (Eastern Time): 09/02/2022 3:09:49 PM Confirm and document reason for call. If symptomatic, describe symptoms. ---Caller states she's been leaking fluid from her legs for 5 days. She reports they are also swollen. She also is having a cough that started around the same time. Does the patient have any new or worsening symptoms? ---Yes Will a triage be completed? ---Yes Related visit to physician within the last 2 weeks? ---No Does the PT have any chronic conditions? (i.e. diabetes, asthma, this includes High risk factors for pregnancy, etc.) ---Yes List chronic conditions. ---pacemaker, HTN Is this a behavioral health or substance abuse call? ---No Guidelines Guideline Title Affirmed Question Affirmed Notes Nurse Date/Time Eilene Ghazi Time) Leg Swelling and Edema Difficulty breathing at rest [place], [place], [place] 09/02/2022 3:11:07 PM Disp. Time Eilene Ghazi Time) Disposition Final User 09/02/2022 3:13:07 PM Go to ED Now Yes Tanya Nones, RN, [place] Final Disposition 09/02/2022 3:13:07 PM Go to ED Now Yes Tanya Nones, RN, Beatrix Shipper Disagree/Comply  Comply Caller Understands Yes PreDisposition InappropriateToAsk Care Advice Given Per Guideline GO TO ED NOW: * You need to be seen in the Emergency Department. * Go to the ED at ___________ [place] now. Drive carefully. CALL EMS 911 IF: * Call EMS if you become worse. CARE ADVICE given per Leg Swelling and Edema (Adult) guideline. Referrals GO TO FACILITY UNDECIDED

## 2022-09-06 ENCOUNTER — Other Ambulatory Visit: Payer: Self-pay | Admitting: Cardiovascular Disease

## 2022-09-06 ENCOUNTER — Telehealth: Payer: Self-pay | Admitting: Cardiovascular Disease

## 2022-09-06 NOTE — Telephone Encounter (Signed)
*  STAT* If patient is at the pharmacy, call can be transferred to refill team.   1. Which medications need to be refilled? (please list name of each medication and dose if known) furosemide (LASIX) 40 MG tablet   2. Which pharmacy/location (including street and city if local pharmacy) is medication to be sent to?[facility], [place] - [address]   3. Do they need a 30 day or 90 day supply? [place]

## 2022-09-07 NOTE — Telephone Encounter (Signed)
Please refill 40 mg twice daily, as that is what she is taking.

## 2022-09-09 MED ORDER — FUROSEMIDE 40 MG PO TABS: 40.0000 mg | ORAL_TABLET | Freq: Two times a day (BID) | ORAL | 2 refills | Status: DC

## 2022-09-09 NOTE — Telephone Encounter (Signed)
Medication refilled and sent to desired pharmacy ?

## 2022-10-05 ENCOUNTER — Ambulatory Visit (INDEPENDENT_AMBULATORY_CARE_PROVIDER_SITE_OTHER): Payer: Medicare Other | Admitting: Family Medicine

## 2022-10-05 ENCOUNTER — Encounter: Payer: Self-pay | Admitting: Family Medicine

## 2022-10-05 ENCOUNTER — Telehealth: Payer: Self-pay | Admitting: Family Medicine

## 2022-10-05 VITALS — BP 130/60 | HR 78 | Temp 98.6°F | Wt 155.8 lb

## 2022-10-05 DIAGNOSIS — L03116 Cellulitis of left lower limb: Secondary | ICD-10-CM

## 2022-10-05 DIAGNOSIS — R600 Localized edema: Secondary | ICD-10-CM | POA: Diagnosis not present

## 2022-10-05 MED ORDER — CEPHALEXIN 500 MG PO CAPS: 500.0000 mg | ORAL_CAPSULE | Freq: Three times a day (TID) | ORAL | 0 refills | Status: DC

## 2022-10-05 NOTE — Telephone Encounter (Signed)
FYI: This call has been transferred to Access Nurse. Once the result note has been entered staff can address the message at that time.  Patient called in with the following symptoms:  Red Word: Sores on leg draining clear fluid x couple of weeks   Please advise at Mobile [phone] (mobile)  Message is routed to Provider Pool and [facility] Triage

## 2022-10-05 NOTE — Progress Notes (Signed)
   Subjective:    Patient ID: [name], female    DOB: [birth year], 89 y.o.   MRN: [id number]  HPI Here for redness and mild soreness in the left lower leg that began several days ago. She has some chronic leg edema, and she has diastolic CHF. Her last BNP on 06-28-22 was 515 (down from 833). She takes Lasix 40 mg BID. She denies any SOB or fever. She has never had this redness in her legs before.    Review of Systems  Constitutional: Negative.   Respiratory: Negative.    Cardiovascular:  Positive for leg swelling. Negative for chest pain and palpitations.       Objective:   Physical Exam Constitutional:      Appearance: She is not ill-appearing.     Comments: Walks with her walker   Cardiovascular:     Rate and Rhythm: Normal rate and regular rhythm.     Pulses: Normal pulses.     Heart sounds: Normal heart sounds.  Pulmonary:     Effort: Pulmonary effort is normal.     Breath sounds: Normal breath sounds.  Musculoskeletal:     Comments: 4+ edema in both lower legs.   Skin:    Comments: The left lower leg has some erythema and some warmth. No tenderness   Neurological:     Mental Status: She is alert.           Assessment & Plan:  She has some cellulitis secondary to edema. Treat with 10 days of Keflex. I advised her to keep the legs elevated as much as she can during the day. Follow up with Dr. Mardelle Matte as needed.  Gershon Crane, MD

## 2022-10-05 NOTE — Telephone Encounter (Signed)
Final disposition: See HCP within 4 hours. Pt has been scheduled with Dr. Clent Ridges @ 3:30pm on 4/16. This is the earliest time she can be seen due to transportation.   Patient Name: [name] [facility] Gender: Female DOB: 1931-07-01 Age: 89 Y 11 M 10 D Return Phone Number: [phone] (Primary) Address: City/ State/ Zip: [place]  [address] Client [facility], [facility] at [facility] Provider Asencion Partridge- MD Contact Type Call Who Is Calling Patient / Member / Family / Caregiver Call Type Triage / Clinical Caller Name [place] [facility] Relationship To Patient Other Return Phone Number [phone] (Primary) Chief Complaint Sores Reason for Call Symptomatic / Request for Health Information Initial Comment Caller sates pt has sores on her legs that are draining clear fluid. Translation No Nurse Assessment Nurse: Annye English, RN, Denise Date/Time (Eastern Time): 10/05/2022 9:53:33 AM Confirm and document reason for call. If symptomatic, describe symptoms. ---Caller sates pt has sores on her lower legs that are draining clear fluid. Legs slightly swollen. Does the patient have any new or worsening symptoms? ---Yes Will a triage be completed? ---Yes Related visit to physician within the last 2 weeks? ---No Does the PT have any chronic conditions? (i.e. diabetes, asthma, this includes High risk factors for pregnancy, etc.) ---Yes List chronic conditions. ---CAD Is this a behavioral health or substance abuse call? ---No Guidelines Guideline Title Affirmed Question Affirmed Notes Nurse Date/Time (Eastern Time) Sores [1] Looks infected (spreading redness, pus) AND [2] large red area (> 2 in. or 5 cm) Carmon, RN, Angelique Blonder 10/05/2022 9:54:35 AM PLEASE NOTE: All timestamps contained within this report are represented as [place] Standard Time. CONFIDENTIALTY NOTICE: This fax transmission is intended only for the  addressee. It contains information that is legally privileged, confidential or otherwise protected from use or disclosure. If you are not the intended recipient, you are strictly prohibited from reviewing, disclosing, copying using or disseminating any of this information or taking any action in reliance on or regarding this information. If you have received this fax in error, please notify us immediately by telephone so that we can arrange for its return to [place]. Phone: [phone], Toll-Free: [phone], Fax: [phone] Page: 2 of 2 Call Id: [id number] Disp. Time Lamount Cohen Time) Disposition Final User 10/05/2022 9:56:56 AM See HCP within 4 Hours (or PCP triage) Yes Annye English, RN, Angelique Blonder Final Disposition 10/05/2022 9:56:56 AM See HCP within 4 Hours (or PCP triage) Yes Carmon, RN, Leighton Ruff Disagree/Comply Comply Caller Understands Yes PreDisposition Call Doctor Care Advice Given Per Guideline SEE HCP (OR PCP TRIAGE) WITHIN 4 HOURS: CALL BACK IF: * You become worse CARE ADVICE given per Sores (Adult) guideline. Referrals REFERRED TO PCP OFFICE

## 2022-10-06 ENCOUNTER — Other Ambulatory Visit: Payer: Self-pay

## 2022-10-06 MED ORDER — TRIAMCINOLONE ACETONIDE 0.1 % EX CREA: 1.0000 | TOPICAL_CREAM | Freq: Two times a day (BID) | CUTANEOUS | 0 refills | Status: DC

## 2022-10-06 NOTE — Telephone Encounter (Signed)
Medication has been sent in and pt is aware.  

## 2022-10-06 NOTE — Telephone Encounter (Signed)
[name] would like a call back about what to put on her legs for the itching. Please advise.

## 2022-10-14 ENCOUNTER — Encounter: Payer: Self-pay | Admitting: Family

## 2022-10-14 ENCOUNTER — Ambulatory Visit (INDEPENDENT_AMBULATORY_CARE_PROVIDER_SITE_OTHER): Payer: Medicare Other | Admitting: Family

## 2022-10-14 VITALS — BP 158/74 | HR 79 | Temp 98.0°F | Ht 59.0 in | Wt 156.6 lb

## 2022-10-14 DIAGNOSIS — L03116 Cellulitis of left lower limb: Secondary | ICD-10-CM | POA: Diagnosis not present

## 2022-10-14 DIAGNOSIS — L03115 Cellulitis of right lower limb: Secondary | ICD-10-CM

## 2022-10-14 DIAGNOSIS — L03119 Cellulitis of unspecified part of limb: Secondary | ICD-10-CM

## 2022-10-14 MED ORDER — CEPHALEXIN 500 MG PO CAPS: 500.0000 mg | ORAL_CAPSULE | Freq: Three times a day (TID) | ORAL | 0 refills | Status: AC

## 2022-10-14 NOTE — Progress Notes (Signed)
Patient ID: [name], female    DOB: [birth year], 89 y.o.   MRN: [id number]  Chief Complaint  Patient presents with   Leg Swelling    Pt c/o bilateral leg swelling worsening, has tried keflex from Dr. Clent Ridges.    HPI:   Leg swelling: both legs, going on for a couple of weeks, seen by different provider 10d ago and given Keflex and advised to elevate her legs, but she thinks they are no better, possibly worse. Also had same cellulitis back in Dec., applied unna boots & given DOXY, Lasix has been increased to 40mg  bid, and has done well until now. Denies standing or walking more than usual but admits she does not always keep legs elevated when resting. Also reports a mild dry cough going on for a couple of weeks.  Assessment & Plan:  1. Cellulitis of lower extremity, unspecified laterality legs warm with erythema, weeping, left leg with several areas of very thick eschar that needs debriding, sending referral to wound center and a few more days of Keflex, as pt is tolerating; legs cleaned with saline water, dried, unna boots applied, covered with kerlix and secured with coban wrap, advised pt to return in 1 week to remove. Keep legs elevated as much as possible. Do not allow unna boots to get wet.  - cephALEXin (KEFLEX) 500 MG capsule; Take 1 capsule (500 mg total) by mouth 3 (three) times daily for 4 days.  Dispense: 12 capsule; Refill: 0 - AMB referral to wound care center - Remv/revisn boot/body cast   Subjective:    Outpatient Medications Prior to Visit  Medication Sig Dispense Refill   acetaminophen (TYLENOL) 500 MG tablet      Ascorbic Acid (VITAMIN C) 100 MG tablet 500 mg daily.     aspirin 81 MG chewable tablet Chew 81 mg by mouth at bedtime.     calcium carbonate (TUMS EX) 750 MG chewable tablet Chew 1 tablet by mouth daily as needed for heartburn.     cephALEXin (KEFLEX) 500 MG capsule Take 1 capsule (500 mg total) by mouth 3 (three) times daily for 10 days. 30 capsule 0    cetirizine (ZYRTEC) 10 MG tablet      cholecalciferol (VITAMIN D3) 25 MCG (1000 UNIT) tablet Take 1 tablet (1,000 Units total) by mouth daily. 30 tablet 0   conjugated estrogens (PREMARIN) vaginal cream      cromolyn (OPTICROM) 4 % ophthalmic solution INSTILL 1 DROP INTO BOTH EYES 4 TIMES A DAY     diltiazem (CARDIZEM CD) 120 MG 24 hr capsule TAKE 1 CAPSULE (120 MG TOTAL) BY MOUTH AT BEDTIME. 90 capsule 3   docusate sodium (COLACE) 100 MG capsule Take 1 capsule (100 mg total) by mouth 2 (two) times daily. 10 capsule 0   furosemide (LASIX) 40 MG tablet Take 1 tablet (40 mg total) by mouth 2 (two) times daily. 180 tablet 2   hydrOXYzine (ATARAX) 10 MG tablet Take 1 tablet (10 mg total) by mouth 3 (three) times daily as needed for itching. 30 tablet 0   KLOR-CON M20 20 MEQ tablet TAKE 1 TABLET (20 MEQ TOTAL) BY MOUTH DAILY. TAKE AN EXTRA TABLET ONCE DAILY WHEN YOU TAKE THE EXTRA FUROSEMIDE 135 tablet 3   levothyroxine (SYNTHROID) 25 MCG tablet Take 2 tablets (50 mcg total) by mouth daily. 180 tablet 3   LORazepam (ATIVAN) 0.5 MG tablet TAKE 1 TABLET BY MOUTH EVERY DAY AS NEEDED FOR ANXIETY 30 tablet 2   methocarbamol (  ROBAXIN) 500 MG tablet Take 1 tablet (500 mg total) by mouth every 8 (eight) hours as needed for muscle spasms. 30 tablet 0   metoprolol succinate (TOPROL-XL) 25 MG 24 hr tablet TAKE 1 TABLET BY MOUTH EVERYDAY AT BEDTIME 90 tablet 3   pantoprazole (PROTONIX) 20 MG tablet Take 1 tablet (20 mg total) by mouth daily. 90 tablet 3   pravastatin (PRAVACHOL) 40 MG tablet TAKE 1 TABLET BY MOUTH EVERY DAY 90 tablet 3   tiZANidine (ZANAFLEX) 2 MG tablet      traMADol (ULTRAM) 50 MG tablet      traZODone (DESYREL) 50 MG tablet Take 0.5-1 tablets (25-50 mg total) by mouth at bedtime as needed for sleep. 30 tablet 5   triamcinolone cream (KENALOG) 0.1 % Apply 1 Application topically 2 (two) times daily. For 2 weeks, then as needed 45 g 0   valsartan (DIOVAN) 160 MG tablet Take 1 tablet (160 mg  total) by mouth daily. 90 tablet 3   No facility-administered medications prior to visit.   Past Medical History:  Diagnosis Date   Allergic rhinitis    Anginal pain    Asthmatic bronchitis    Chronic cough    Dysrhythmia    PAF   Esophageal reflux    GERD (gastroesophageal reflux disease)    History of stress test 11/20/2009   This was essentially normal,post-stress EF was hyperdynamic at 73 beats per mins, There was no scar or ischemia.   Hx of echocardiogram 11/05/2010   EF>55% showed a normal systolic function with impaired diastolic dysfuntion and also tissue doppler suggesting increased elevated left atrial pressure, she had moderate LA dilatation and mild LA dilatation. There was calcified mitral apparatus with moderate MR, mild to moderate TR and trace aortic insufficiency.   Hyperlipemia    Hypertension    Hypothyroidism    PAF (paroxysmal atrial fibrillation)    Pneumonia    Past Surgical History:  Procedure Laterality Date   CHOLECYSTECTOMY     CYSTOSCOPY WITH BIOPSY N/A 08/29/2020   Procedure: CYSTOSCOPY WITH BIOPSY WITH FULGURATION;  Surgeon: Bjorn Pippin, MD;  Location: [facility];  Service: Urology;  Laterality: N/A;   FOOT SURGERY     right   NASAL SEPTUM SURGERY     PACEMAKER INSERTION  June 1st 2009   St Jude [place] device, model #1610, serial #[id number], The artial lead is a Lexicographer, the ventricular lead is Runner, broadcasting/film/video.   PPM GENERATOR CHANGEOUT N/A 02/09/2017   Procedure: PPM GENERATOR CHANGEOUT;  Surgeon: Thurmon Fair, MD;  Location: [facility];  Service: Cardiovascular;  Laterality: N/A;   THYROID SURGERY     TONSILLECTOMY     TONSILLECTOMY     VESICOVAGINAL FISTULA CLOSURE W/ TAH     Allergies  Allergen Reactions   Fluticasone-Salmeterol Other (See Comments)    unknown Other reaction(s): tongue/throat symptoms   Sulfa Antibiotics     Other reaction(s): gastric intol   Sulfonamide Derivatives Other (See Comments)    unknown    Tetracycline Hcl    Benzonatate Other (See Comments)    tongue/throat symptoms in the past but tolerated more recently Other reaction(s): and tongue/throat symptoms in the past (not more recently)   Erythromycin Nausea And Vomiting and Other (See Comments)    Stomach cramps Other reaction(s): gastric intol   Meloxicam Other (See Comments)    Mild dyspnea Other reaction(s): dyspnea   Sulfamethoxazole Other (See Comments)    Gastric intolerance  Objective:    Physical Exam Vitals and nursing note reviewed.  Constitutional:      Appearance: Normal appearance. She is obese.  Cardiovascular:     Rate and Rhythm: Normal rate and regular rhythm.     Comments: bilateral weeping noted on both lower legs, many small areas of crusted serous drainage, 3 areas on left leg with thick, raised, eschar, largest approx 3.5cm in diameter. Pulmonary:     Effort: Pulmonary effort is normal.     Breath sounds: Normal breath sounds.  Musculoskeletal:        General: Normal range of motion.     Right lower leg: 3+ Edema present.     Left lower leg: 3+ Edema present.       Legs:  Skin:    General: Skin is warm and dry.  Neurological:     Mental Status: She is alert.  Psychiatric:        Mood and Affect: Mood normal.        Behavior: Behavior normal.    BP (!) 158/74 (BP Location: Left Arm, Patient Position: Sitting, Cuff Size: Normal)   Pulse 79   Temp 98 F (36.7 C) (Temporal)   Ht  (1.499 m)   Wt 156 lb 9.6 oz (71 kg)   SpO2 97%   BMI 31.63 kg/m  Wt Readings from Last 3 Encounters:  10/14/22 156 lb 9.6 oz (71 kg)  10/05/22 155 lb 12.8 oz (70.7 kg)  07/01/22 148 lb 6.4 oz (67.3 kg)      Dulce Sellar, NP

## 2022-10-20 ENCOUNTER — Ambulatory Visit (INDEPENDENT_AMBULATORY_CARE_PROVIDER_SITE_OTHER): Payer: Medicare Other | Admitting: Family Medicine

## 2022-10-20 ENCOUNTER — Encounter: Payer: Self-pay | Admitting: Family Medicine

## 2022-10-20 VITALS — BP 152/64 | HR 70 | Temp 98.1°F | Ht 59.0 in | Wt 156.4 lb

## 2022-10-20 DIAGNOSIS — I872 Venous insufficiency (chronic) (peripheral): Secondary | ICD-10-CM

## 2022-10-20 DIAGNOSIS — I5032 Chronic diastolic (congestive) heart failure: Secondary | ICD-10-CM

## 2022-10-20 DIAGNOSIS — I10 Essential (primary) hypertension: Secondary | ICD-10-CM

## 2022-10-20 DIAGNOSIS — L03116 Cellulitis of left lower limb: Secondary | ICD-10-CM | POA: Diagnosis not present

## 2022-10-20 NOTE — Progress Notes (Signed)
Subjective  CC:  Chief Complaint  Patient presents with   Leg Swelling    Cellulitis of lower extremity, unspecified laterality    HPI: [name] is a 89 y.o. female who presents to the office today to address the problems listed above in the chief complaint. 89 year old with chronic venous insufficiency and recurrent cellulitis, chronic kidney disease, chronic diastolic heart failure presents for follow-up.  This is her third visit.  I reviewed her last 2 visits.  She has been treated with Keflex, Lasix, triamcinolone cream and Unna boots.  She is frustrated.  However no fevers, no longer weeping, no pain. Review of systems positive for mild cough, itchy eyes, postnasal drainage, negative for wheezing, pleuritic chest pain or shortness of breath.  Lab Results  Component Value Date   CREATININE 1.31 (H) 08/06/2022   BUN 29 (H) 08/06/2022   NA 139 08/06/2022   K 4.7 08/06/2022   CL 100 08/06/2022   CO2 22 08/06/2022     Assessment  1. Cellulitis of left lower extremity   2. Venous insufficiency of both lower extremities   3. Chronic diastolic heart failure (HCC)   4. Essential hypertension      Plan  Chronic venous insufficiency dermatitis: Continue triamcinolone cream.  Recommend compression stockings.  Add Lasix 40 twice daily, limited by chronic kidney disease.  Recommend leg elevation.  She does have old scabbed bullae and has been referred to wound center.  She will see them in 2 weeks.  No further antibiotics needed at this time.  Education given. Blood pressure is controlled  Follow up: As scheduled 10/27/2022  No orders of the defined types were placed in this encounter.  No orders of the defined types were placed in this encounter.     I reviewed the patients updated PMH, FH, and SocHx.    Patient Active Problem List   Diagnosis Date Noted   Chronic diastolic heart failure (HCC) 03/03/2022    Priority: High   Stage 3a chronic kidney disease (CKD) (HCC)  04/27/2021    Priority: High   SSS (sick sinus syndrome) (HCC) 06/15/2017    Priority: High   PAT (paroxysmal atrial tachycardia) 06/15/2017    Priority: High   Impaired fasting glucose 07/27/2016    Priority: High   CHB (complete heart block) (HCC) s/p pacemaker 05/17/2013    Priority: High   Pacemaker 02/04/2013    Priority: High   Essential hypertension 02/04/2013    Priority: High   Mixed hyperlipidemia 02/04/2013    Priority: High   Osteopenia 04/27/2021    Priority: Medium    Adjustment disorder with anxiety 04/27/2021    Priority: Medium    Early stage nonexudative age-related macular degeneration of right eye 03/06/2020    Priority: Medium    Early stage nonexudative age-related macular degeneration of left eye 03/06/2020    Priority: Medium    Laryngopharyngeal reflux (LPR) 03/11/2017    Priority: Medium    Primary hypothyroidism 07/27/2016    Priority: Medium    Lower extremity edema 11/22/2014    Priority: Medium    Asthma 02/20/2010    Priority: Medium    GERD (gastroesophageal reflux disease) 10/26/2007    Priority: Medium    Chronic cough 10/26/2007    Priority: Medium    Choroidal nevus of right eye 03/06/2020    Priority: Low   Pseudophakia 03/06/2020    Priority: Low   Vitamin D deficiency 07/27/2016    Priority: Low   Allergic  rhinitis due to pollen 08/21/2010    Priority: Low   Venous insufficiency of both lower extremities 05/31/2022   Primary insomnia 10/28/2021   Normocytic anemia 07/14/2021   Current Meds  Medication Sig   acetaminophen (TYLENOL) 500 MG tablet    Ascorbic Acid (VITAMIN C) 100 MG tablet 500 mg daily.   aspirin 81 MG chewable tablet Chew 81 mg by mouth at bedtime.   calcium carbonate (TUMS EX) 750 MG chewable tablet Chew 1 tablet by mouth daily as needed for heartburn.   cetirizine (ZYRTEC) 10 MG tablet    cholecalciferol (VITAMIN D3) 25 MCG (1000 UNIT) tablet Take 1 tablet (1,000 Units total) by mouth daily.   conjugated  estrogens (PREMARIN) vaginal cream    cromolyn (OPTICROM) 4 % ophthalmic solution INSTILL 1 DROP INTO BOTH EYES 4 TIMES A DAY   diltiazem (CARDIZEM CD) 120 MG 24 hr capsule TAKE 1 CAPSULE (120 MG TOTAL) BY MOUTH AT BEDTIME.   docusate sodium (COLACE) 100 MG capsule Take 1 capsule (100 mg total) by mouth 2 (two) times daily.   furosemide (LASIX) 40 MG tablet Take 1 tablet (40 mg total) by mouth 2 (two) times daily.   hydrOXYzine (ATARAX) 10 MG tablet Take 1 tablet (10 mg total) by mouth 3 (three) times daily as needed for itching.   KLOR-CON M20 20 MEQ tablet TAKE 1 TABLET (20 MEQ TOTAL) BY MOUTH DAILY. TAKE AN EXTRA TABLET ONCE DAILY WHEN YOU TAKE THE EXTRA FUROSEMIDE   levothyroxine (SYNTHROID) 25 MCG tablet Take 2 tablets (50 mcg total) by mouth daily.   LORazepam (ATIVAN) 0.5 MG tablet TAKE 1 TABLET BY MOUTH EVERY DAY AS NEEDED FOR ANXIETY   methocarbamol (ROBAXIN) 500 MG tablet Take 1 tablet (500 mg total) by mouth every 8 (eight) hours as needed for muscle spasms.   metoprolol succinate (TOPROL-XL) 25 MG 24 hr tablet TAKE 1 TABLET BY MOUTH EVERYDAY AT BEDTIME   pantoprazole (PROTONIX) 20 MG tablet Take 1 tablet (20 mg total) by mouth daily.   pravastatin (PRAVACHOL) 40 MG tablet TAKE 1 TABLET BY MOUTH EVERY DAY   tiZANidine (ZANAFLEX) 2 MG tablet    traMADol (ULTRAM) 50 MG tablet    traZODone (DESYREL) 50 MG tablet Take 0.5-1 tablets (25-50 mg total) by mouth at bedtime as needed for sleep.   triamcinolone cream (KENALOG) 0.1 % Apply 1 Application topically 2 (two) times daily. For 2 weeks, then as needed   valsartan (DIOVAN) 160 MG tablet Take 1 tablet (160 mg total) by mouth daily.    Allergies: Patient is allergic to fluticasone-salmeterol, sulfa antibiotics, sulfonamide derivatives, tetracycline hcl, benzonatate, erythromycin, meloxicam, and sulfamethoxazole. Family History: Patient family history includes Cancer in her father; Stroke in her mother and sister. Social History:   Patient  reports that she has never smoked. She has never used smokeless tobacco. She reports that she does not drink alcohol and does not use drugs.  Review of Systems: Constitutional: Negative for fever malaise or anorexia Cardiovascular: negative for chest pain Respiratory: negative for SOB or persistent cough Gastrointestinal: negative for abdominal pain  Objective  Vitals: BP (!) 152/64   Pulse 70   Temp 98.1 F (36.7 C)   Ht 4\' 11"  (1.499 m)   Wt 156 lb 6.4 oz (70.9 kg)   SpO2 97%   BMI 31.59 kg/m  General: no acute distress , A&Ox3 Psych: Appears frustrated HEENT: PEERL, conjunctiva normal, neck is supple Cardiovascular:  RRR without murmur or gallop.  Respiratory:  Good  breath sounds bilaterally, CTAB with normal respiratory effort Skin: Chronic venous stasis dermatitis bilateral legs, left greater than right, +2 pitting edema, old scabbed bullae present.  No weeping.  Dry.  Minimal warmth  Commons side effects, risks, benefits, and alternatives for medications and treatment plan prescribed today were discussed, and the patient expressed understanding of the given instructions. Patient is instructed to call or message via MyChart if he/she has any questions or concerns regarding our treatment plan. No barriers to understanding were identified. We discussed Red Flag symptoms and signs in detail. Patient expressed understanding regarding what to do in case of urgent or emergency type symptoms.  Medication list was reconciled, printed and provided to the patient in AVS. Patient instructions and summary information was reviewed with the patient as documented in the AVS. This note was prepared with assistance of Dragon voice recognition software. Occasional wrong-word or sound-a-like substitutions may have occurred due to the inherent limitations of voice recognition software

## 2022-10-25 ENCOUNTER — Ambulatory Visit: Payer: Medicare Other | Attending: Cardiovascular Disease | Admitting: Cardiovascular Disease

## 2022-10-25 ENCOUNTER — Encounter: Payer: Self-pay | Admitting: Cardiovascular Disease

## 2022-10-25 VITALS — BP 128/82 | HR 79 | Ht 59.0 in | Wt 149.0 lb

## 2022-10-25 DIAGNOSIS — I872 Venous insufficiency (chronic) (peripheral): Secondary | ICD-10-CM | POA: Diagnosis not present

## 2022-10-25 DIAGNOSIS — E7800 Pure hypercholesterolemia, unspecified: Secondary | ICD-10-CM | POA: Diagnosis not present

## 2022-10-25 DIAGNOSIS — Z950 Presence of cardiac pacemaker: Secondary | ICD-10-CM | POA: Diagnosis not present

## 2022-10-25 DIAGNOSIS — J4520 Mild intermittent asthma, uncomplicated: Secondary | ICD-10-CM

## 2022-10-25 DIAGNOSIS — I10 Essential (primary) hypertension: Secondary | ICD-10-CM

## 2022-10-25 DIAGNOSIS — R600 Localized edema: Secondary | ICD-10-CM | POA: Diagnosis not present

## 2022-10-25 DIAGNOSIS — I5032 Chronic diastolic (congestive) heart failure: Secondary | ICD-10-CM

## 2022-10-25 LAB — COMPREHENSIVE METABOLIC PANEL

## 2022-10-25 LAB — CBC
Hemoglobin: 10.5 g/dL — ABNORMAL LOW (ref 11.1–15.9)
MCH: 28.7 pg (ref 26.6–33.0)
MCHC: 32.3 g/dL (ref 31.5–35.7)

## 2022-10-25 LAB — TSH

## 2022-10-25 MED ORDER — ALBUTEROL SULFATE HFA 108 (90 BASE) MCG/ACT IN AERS: 2.0000 | INHALATION_SPRAY | Freq: Four times a day (QID) | RESPIRATORY_TRACT | 2 refills | Status: DC | PRN

## 2022-10-25 MED ORDER — TORSEMIDE 20 MG PO TABS: 40.0000 mg | ORAL_TABLET | Freq: Two times a day (BID) | ORAL | 3 refills | Status: DC

## 2022-10-25 NOTE — Progress Notes (Signed)
Cardiology Office Note    Date:  11/01/2022   ID:  [name], DOB 01-Jul-1931, MRN [id number]  PCP:  Willow Ora, MD  Cardiologist:  Nicki Guadalajara, MD   21 month F/U evaluation  History of Present Illness:  [name] is a 89 y.o. female  who underwent dual-chamber pacemaker implantation for complete heart block. She has a history of paroxysmal atrial fibrillation, a history of lower extremity edema as well as GERD. She also has a history of hypertension as well as hyperlipidemia.   A follow-up echo Doppler study on 10/23/2014 revealed an ejection fraction in the 50-55% range with grade 1 diastolic dysfunction and mild mitral regurgitation. Her last pacemaker interrogation was in June 2017 by Dr. Royann Shivers.  At that time, she had normal pacemaker function and had 97% atrial pacing and 100% ventricular pacing.  There was no underlying escape rhythm.  Estimated longevity was 1.75-2.25 years.   Typically she goes to bed at midnight and wakes up at 6 AM.  She experiences a dry cough and is unaware of any postnasal drip.  She has a history of GERD.  She denies recent wheezing. She denies presyncope or syncope. At times she admits to intermittent leg swelling. She has a history of hypothyroidism on thyroid replacement.    She sees Dr. Leslie Dales for primary and endocrinologic care and saw him on 08/03/2016.  He is now part of the [facility].  Her recent lipid study on pravastatin from February 2018 showed a total cholesterol 137, triglycerides 89, HDL 61, and LDL 69.; Hemoglobin A1c was 5.8.  TSH was 3.2.  Vitamin D level was 42.    She underwent a dual pacemaker generator changeout on 02/09/2017  after her battery reached ERI. She has had an evaluation for reflux related laryngitis/cough by Dr. Pollyann Kennedy.  She had remotely been on allergy shots but stopped in early 2018.   I saw her in October 2018, she has done well and has been undergoing every 93-month pacemaker  checks.  Unfortunately, she had broken her second metatarsal of her right foot in November 2019 and required wearing a boot for some time.  Her ambulation has been somewhat difficult since and she does use a cane for ambulation.  She has difficulty walking, and when doing so admits to leg fatigue and some hip discomfort.  She denies any exertional shortness of breath or chest tightness.  She is unaware of recent palpitations on her current therapy which includes diltiazem 180 mg, metoprolol succinate 25 mg daily and in addition for blood pressure control she also is on valsartan HCT 160/25 mg at times she does admit to some occasional ankle swelling at the end of the day but this is normalized in the morning.  She admits to sleeping well.  She is unaware of any recurrent episodes of atrial fibrillation.  She continues to be on pravastatin for hyperlipidemia.  I reviewed the medical records of Dr. Rocky Crafts.  When she  saw Dr. Leslie Dales her blood pressure on July 03, 2018 was 118/50.  Her LDL cholesterol at that time was 85 in December 2019 which had slightly improved from 3 months previously when her LDL was 54.   I evaluated her with a telemedicine visit on September 22, 2018.  At that time she was on valsartan HCT 160/25, diltiazem CD 1 8 mg and metoprolol succinate 25 mg.  Her blood pressure was elevated and I had suggested that  she continue to monitor her blood pressure and that if additional blood pressure corneas were consistently elevated medication adjustment were necessary.   She was evaluated by Dr. Royann Shivers in January 26, 2019 and her pacemaker was stable but she is pacemaker dependent and did not have any underlying escape rhythm.  Blood pressure was slightly elevated at 146/65.    I last saw her in September 2020 at which time she denied any exertional chest pain or shortness of breath.  She did experience mild nonexertional left-sided discomfort and also admitted to some fatigue.  She typically was  going to bed around 11:30 AM.  She felt that she was sleeping well.  She has GERD and was scheduled to see Dr. May got in follow-up.  She saw Dr. Royann Shivers on July 30, 2019.  She was pacemaker dependent without underlying escape rhythm.  She still had symptoms suggestive of chronotropic incompetence and her pacemaker was reprogrammed.  She had not had any recorded atrial fibrillation in years.  I saw her in March 2021 at which time she felt well and denied any anginal type chest pain.  She had experienced some mild intermittent leg swelling.    At times she notes some chest sensation nocturnally which sounded more GERD than ischemic.  She  continued to be on valsartan HCT 160/25 mg, Toprol-XL 25 mg and diltiazem 180 mg daily.  She had been taking pantoprazole in the morning.  She has hypothyroidism on levothyroxine.  She continued to take pravastatin 40 mg daily.  She was maintaining sinus rhythm.  I saw her on May 06, 2020.  Since her prior evaluation she continued to do well but had some back problems.  Occasionally she uses a cane to assist in walking.  She denies any chest pain.  She is scheduled to see Dr. Royann Shivers in February for follow-up pacemaker.  She continues to be seen by Dr. Kyra Searles who checks laboratory.  Most recent lipid studies in July 2021 showed total cholesterol 127, triglycerides 94, HDL 56 and LDL 64.  Hemoglobin A1c was 6.0.  I last saw her on February 13, 2021.  Since I last saw her, Dr. Kyra Searles has retired.  She denies any chest pain or shortness of breath.  At times she has noted some mild left leg edema. She continues to be on furosemide 20 mg and has been taking this only 1 time per week.  She is on diltiazem 180 mg, metoprolol 25 mg daily and valsartan HCT 160/25 mg for hypertension.  She is on low-dose levothyroxine at 25 mcg for hypothyroidism.  She continues to be on pravastatin 40 mg daily and pantoprazole 40 mg for GERD.  During that evaluation her blood pressure  was controlled.  He was maintaining sinus rhythm without recurrent AF and ECG showed AV paced rhythm at 70 bpm.  She underwent an echo Doppler study on July 15, 2021 which showed EF 60 to 65%.  There was grade 1 diastolic dysfunction.  RV systolic pressure was 31.5 mmHg.  There was mild left atrial dilatation.  Valves were essentially normal.  She saw Dr. Royann Shivers in February 2023 and pacemaker interrogation showed normal device function.  During that evaluation, she had lower extremity edema and diltiazem dose was reduced.  Presently, [name] presents to the office today for follow-up evaluation.  Today [birth year] her birthday.  She has continued to experience significant bilateral lower extremity edema with scaliness and scarring and has venous insufficiency.  A lower extremity Doppler study  in January 2023 was negative for DVT.  At times she admits to a cough with mild wheezing.  She denies chest pain.  She is on diltiazem 120 mg daily, furosemide 40 mg twice a day, metoprolol succinate 25 mg at bedtime, valsartan 160 mg daily, and takes levothyroxine 50 mcg for hypothyroidism and pravastatin 40 mg for hyperlipidemia.  She presents for evaluation.   Past Medical History:  Diagnosis Date   Allergic rhinitis    Anginal pain (HCC)    Asthmatic bronchitis    Chronic cough    Dysrhythmia    PAF   Esophageal reflux    GERD (gastroesophageal reflux disease)    History of stress test 11/20/2009   This was essentially normal,post-stress EF was hyperdynamic at 73 beats per mins, There was no scar or ischemia.   Hx of echocardiogram 11/05/2010   EF>55% showed a normal systolic function with impaired diastolic dysfuntion and also tissue doppler suggesting increased elevated left atrial pressure, she had moderate LA dilatation and mild LA dilatation. There was calcified mitral apparatus with moderate MR, mild to moderate TR and trace aortic insufficiency.   Hyperlipemia    Hypertension     Hypothyroidism    PAF (paroxysmal atrial fibrillation) (HCC)    Pneumonia     Past Surgical History:  Procedure Laterality Date   CHOLECYSTECTOMY     CYSTOSCOPY WITH BIOPSY N/A 08/29/2020   Procedure: CYSTOSCOPY WITH BIOPSY WITH FULGURATION;  Surgeon: Bjorn Pippin, MD;  Location: [facility];  Service: Urology;  Laterality: N/A;   FOOT SURGERY     right   NASAL SEPTUM SURGERY     PACEMAKER INSERTION  June 1st 2009   St Jude Pocahontas device, model #4098, serial #[id number], The artial lead is a Lexicographer, the ventricular lead is Runner, broadcasting/film/video.   PPM GENERATOR CHANGEOUT N/A 02/09/2017   Procedure: PPM GENERATOR CHANGEOUT;  Surgeon: Thurmon Fair, MD;  Location: [facility];  Service: Cardiovascular;  Laterality: N/A;   THYROID SURGERY     TONSILLECTOMY     TONSILLECTOMY     VESICOVAGINAL FISTULA CLOSURE W/ TAH      Current Medications: Outpatient Medications Prior to Visit  Medication Sig Dispense Refill   acetaminophen (TYLENOL) 500 MG tablet      Ascorbic Acid (VITAMIN C) 100 MG tablet 500 mg daily.     aspirin 81 MG chewable tablet Chew 81 mg by mouth at bedtime.     calcium carbonate (TUMS EX) 750 MG chewable tablet Chew 1 tablet by mouth daily as needed for heartburn.     cetirizine (ZYRTEC) 10 MG tablet      cholecalciferol (VITAMIN D3) 25 MCG (1000 UNIT) tablet Take 1 tablet (1,000 Units total) by mouth daily. 30 tablet 0   conjugated estrogens (PREMARIN) vaginal cream      cromolyn (OPTICROM) 4 % ophthalmic solution INSTILL 1 DROP INTO BOTH EYES 4 TIMES A DAY (Patient not taking: Reported on 10/27/2022)     diltiazem (CARDIZEM CD) 120 MG 24 hr capsule TAKE 1 CAPSULE (120 MG TOTAL) BY MOUTH AT BEDTIME. 90 capsule 3   docusate sodium (COLACE) 100 MG capsule Take 1 capsule (100 mg total) by mouth 2 (two) times daily. 10 capsule 0   hydrOXYzine (ATARAX) 10 MG tablet Take 1 tablet (10 mg total) by mouth 3 (three) times daily as needed for itching. 30 tablet 0   KLOR-CON M20 20  MEQ tablet TAKE 1 TABLET (20 MEQ TOTAL) BY MOUTH  DAILY. TAKE AN EXTRA TABLET ONCE DAILY WHEN YOU TAKE THE EXTRA FUROSEMIDE 135 tablet 3   levothyroxine (SYNTHROID) 25 MCG tablet Take 2 tablets (50 mcg total) by mouth daily. 180 tablet 3   LORazepam (ATIVAN) 0.5 MG tablet TAKE 1 TABLET BY MOUTH EVERY DAY AS NEEDED FOR ANXIETY 30 tablet 2   metoprolol succinate (TOPROL-XL) 25 MG 24 hr tablet TAKE 1 TABLET BY MOUTH EVERYDAY AT BEDTIME 90 tablet 3   pantoprazole (PROTONIX) 20 MG tablet Take 1 tablet (20 mg total) by mouth daily. 90 tablet 3   pravastatin (PRAVACHOL) 40 MG tablet TAKE 1 TABLET BY MOUTH EVERY DAY 90 tablet 3   tiZANidine (ZANAFLEX) 2 MG tablet      traMADol (ULTRAM) 50 MG tablet      triamcinolone cream (KENALOG) 0.1 % Apply 1 Application topically 2 (two) times daily. For 2 weeks, then as needed 45 g 0   valsartan (DIOVAN) 160 MG tablet Take 1 tablet (160 mg total) by mouth daily. 90 tablet 3   furosemide (LASIX) 40 MG tablet Take 1 tablet (40 mg total) by mouth 2 (two) times daily. 180 tablet 2   methocarbamol (ROBAXIN) 500 MG tablet Take 1 tablet (500 mg total) by mouth every 8 (eight) hours as needed for muscle spasms. 30 tablet 0   traZODone (DESYREL) 50 MG tablet Take 0.5-1 tablets (25-50 mg total) by mouth at bedtime as needed for sleep. (Patient not taking: Reported on 10/25/2022) 30 tablet 5   No facility-administered medications prior to visit.     Allergies:   Fluticasone-salmeterol, Sulfa antibiotics, Sulfonamide derivatives, Tetracycline hcl, Benzonatate, Erythromycin, Meloxicam, and Sulfamethoxazole   Social History   Socioeconomic History   Marital status: Widowed    Spouse name: Not on file   Number of children: Not on file   Years of education: Not on file   Highest education level: Not on file  Occupational History    Employer: RETIRED    Comment: Retired Heritage manager at [employer]  Tobacco Use   Smoking status: Never   Smokeless tobacco: Never   Vaping Use   Vaping Use: Never used  Substance and Sexual Activity   Alcohol use: No   Drug use: No   Sexual activity: Not on file  Other Topics Concern   Not on file  Social History Narrative   Not on file   Social Determinants of Health   Financial Resource Strain: Low Risk  (10/27/2021)   Overall Financial Resource Strain (CARDIA)    Difficulty of Paying Living Expenses: Not hard at all  Food Insecurity: No Food Insecurity (10/27/2021)   Hunger Vital Sign    Worried About Running Out of Food in the Last Year: Never true    Ran Out of Food in the Last Year: Never true  Transportation Needs: No Transportation Needs (10/27/2021)   PRAPARE - Administrator, Civil Service (Medical): No    Lack of Transportation (Non-Medical): No  Physical Activity: Insufficiently Active (10/27/2021)   Exercise Vital Sign    Days of Exercise per Week: 5 days    Minutes of Exercise per Session: 10 min  Stress: No Stress Concern Present (10/27/2021)   [employer] of Occupational Health - Occupational Stress Questionnaire    Feeling of Stress : Not at all  Social Connections: Socially Isolated (10/27/2021)   Social Connection and Isolation Panel [NHANES]    Frequency of Communication with Friends and Family: More than three times a week  Frequency of Social Gatherings with Friends and Family: Twice a week    Attends Religious Services: Never    Database administrator or Organizations: No    Attends Banker Meetings: Never    Marital Status: Widowed     Family History:  The patient's family history includes Cancer in her father; Stroke in her mother and sister.   ROS General: Negative; No fevers, chills, or night sweats; patient will fatigue HEENT: Negative; No changes in vision or hearing, sinus congestion, difficulty swallowing Pulmonary: Negative; No cough, wheezing, shortness of breath, hemoptysis Cardiovascular: Occasional mild nonexertional left-sided chest  sensation; venous insufficiency with tense lower extremity edema GI: GERD GU: Negative; No dysuria, hematuria, or difficulty voiding Musculoskeletal: Back and hip discomfort Hematologic/Oncology: Negative; no easy bruising, bleeding Endocrine: Negative; no heat/cold intolerance; no diabetes Neuro: Negative; no changes in balance, headaches Skin: Negative; No rashes or skin lesions Psychiatric: Negative; No behavioral problems, depression Sleep: Negative; No snoring, daytime sleepiness, hypersomnolence, bruxism, restless legs, hypnogognic hallucinations, no cataplexy Other comprehensive 14 point system review is negative.   PHYSICAL EXAM:   VS:  BP 128/82   Pulse 79   Ht 4\' 11"  (1.499 m)   Wt 149 lb (67.6 kg)   SpO2 98%   BMI 30.09 kg/m     Repeat blood pressure by me was 126/80  Wt Readings from Last 3 Encounters:  10/27/22 154 lb 3.2 oz (69.9 kg)  10/25/22 149 lb (67.6 kg)  10/20/22 156 lb 6.4 oz (70.9 kg)   General: Alert, oriented, no distress.  Skin: normal turgor, no rashes, warm and dry HEENT: Normocephalic, atraumatic. Pupils equal round and reactive to light; sclera anicteric; extraocular muscles intact;  Nose without nasal septal hypertrophy Mouth/Parynx benign; Mallinpatti scale 3 Neck: No JVD, no carotid bruits; normal carotid upstroke Lungs: clear to ausculatation and percussion; no wheezing or rales Chest wall: without tenderness to palpitation Heart: PMI not displaced, RRR, s1 s2 normal, 1/6 systolic murmur, no diastolic murmur, no rubs, gallops, thrills, or heaves Abdomen: soft, nontender; no hepatosplenomehaly, BS+; abdominal aorta nontender and not dilated by palpation. Back: no CVA tenderness Pulses 2+ Musculoskeletal: full range of motion, normal strength, no joint deformities Extremities: 3+ tense lower extremity edema with scaliness and scarring.  No clubbing cyanosis, Homan's sign negative  Neurologic: grossly nonfocal; Cranial nerves grossly  wnl Psychologic: Normal mood and affect    Studies/Labs Reviewed:   Oct 25, 2022 ECG (independently read by me): A sensed V paced at 79  February 13, 2021 ECG (independently read by me): AV paced rhythm at 70 with prolonged AV condiuction  March 2021 ECG (independently read by me): Atrially paced rhythm with prolonged AV conduction with PR interval 222 ms.  Left bundle branch block.  QTc interval 483 ms  September 2020 EKG:  EKG is ordered today.  ECG (independently read by me): AV paced rhythm at 70 bpm.  Prolonged AV conduction with PR interval at 222 ms.  October 2018 ECG (independently read by me): AV paced rhythm with a ventricular rate at 70 bpm.  PR interval 224 ms.  QTc interval 477 ms.   March 2018 ECG (independently read by me): AV paced rhythm at 70 bpm with prolonged AV conduction with a PR interval of 222 ms.  QTc interval 490 ms.   October 2017 ECG (independently read by me): AV paced rhythm with 100% pacing of both chambers.  PR interval 226 ms.     December 2016 ECG (independently  read by me):  100% AV pacing with ventricular rate at 70 and prolonged AV conduction at 224 ms.   June 2016  ECG (independently read by me): AV sequentially paced rhythm with 100% capture.  Ventricular rate 70 beats per minute.  PR interval 224 ms.   August 2014 ECG: 80 paced rhythm at 70 beats per minute. PR interval 222 ms.    Recent Labs:    Latest Ref Rng & Units 10/25/2022    4:30 PM 08/06/2022    3:38 PM 06/28/2022    4:12 PM  BMP  Glucose 70 - 99 mg/dL 98  91  161   BUN 8 - 27 mg/dL 26  29  45   Creatinine 0.57 - 1.00 mg/dL 0.96  0.45  4.09   BUN/Creat Ratio 12 - 28 19  22     Sodium 134 - 144 mmol/L 142  139  143   Potassium 3.5 - 5.2 mmol/L 4.7  4.7  4.5   Chloride 96 - 106 mmol/L 102  100  101   CO2 20 - 29 mmol/L 24  22  30    Calcium 8.7 - 10.3 mg/dL 9.4  9.0  9.2         Latest Ref Rng & Units 10/25/2022    4:30 PM 05/31/2022    3:54 PM 04/30/2022   12:11 PM  Hepatic  Function  Total Protein 6.0 - 8.5 g/dL 7.7  6.8  7.3   Albumin 3.7 - 4.7 g/dL 4.1  3.8  3.9   AST 0 - 40 IU/L 17  13  15    ALT 0 - 32 IU/L 8  8  8    Alk Phosphatase 44 - 121 IU/L 81  66  57   Total Bilirubin 0.0 - 1.2 mg/dL 0.3  0.2  0.3        Latest Ref Rng & Units 10/25/2022    4:30 PM 05/21/2022    4:56 PM 04/30/2022   12:11 PM  CBC  WBC 3.4 - 10.8 x10E3/uL 13.2  9.7  9.5   Hemoglobin 11.1 - 15.9 g/dL 81.1  91.4  78.2   Hematocrit 34.0 - 46.6 % 32.5  30.3  32.5   Platelets 150 - 450 x10E3/uL 263  278  245.0    Lab Results  Component Value Date   MCV 89 10/25/2022   MCV 87.8 05/21/2022   MCV 89.4 04/30/2022   Lab Results  Component Value Date   TSH 3.166 Test methodology is 3rd generation TSH 11/24/2007   Lab Results  Component Value Date   HGBA1C 6.1 04/30/2022     BNP    Component Value Date/Time   BNP 23.5 07/13/2021 1000    ProBNP    Component Value Date/Time   PROBNP 515 06/28/2022 1612   PROBNP 86.0 04/30/2022 1211     Lipid Panel     Component Value Date/Time   CHOL 134 04/30/2022 1211   TRIG 100.0 04/30/2022 1211   HDL 54.80 04/30/2022 1211   CHOLHDL 2 04/30/2022 1211   VLDL 20.0 04/30/2022 1211   LDLCALC 59 04/30/2022 1211     RADIOLOGY: No results found.  Echo: 07/15/2021  1. Left ventricular ejection fraction, by estimation, is 60 to 65%. The  left ventricle has normal function. The left ventricle has no regional  wall motion abnormalities. There is mild left ventricular hypertrophy.  Left ventricular diastolic parameters  are consistent with Grade I diastolic dysfunction (impaired relaxation).   2. Right ventricular  systolic function is normal. The right ventricular  size is normal. The estimated right ventricular systolic pressure is 31.5  mmHg.   3. Left atrial size was mildly dilated.   4. The mitral valve is normal in structure. Trivial mitral valve  regurgitation. No evidence of mitral stenosis.   5. The aortic valve is  tricuspid. Aortic valve regurgitation is not  visualized. No aortic stenosis is present.   6. The inferior vena cava is normal in size with greater than 50%  respiratory variability, suggesting right atrial pressure of 3 mmHg.    ASSESSMENT:    1. Essential hypertension   2. Lower extremity edema   3. Chronic diastolic heart failure (HCC)   4. Pacemaker   5. Hypercholesteremia   6. Venous insufficiency of both lower extremities   7. Intermittent asthma without complication, unspecified asthma severity     PLAN:  1.  Essential hypertension: Her blood pressure is controlled on her current regimen consisting of diltiazem 120 daily, furosemide 40 mg twice a day, metoprolol succinate 25 mg, and valsartan 160 mg daily.  2.  Lower extremity edema/venous insufficiency: She has significant tense edema bilaterally to her lower extremities.  I have recommended she discontinue furosemide and in its place we will transition her to torsemide and she will initially take 40 mg twice a day for the next several days and depending upon response can reduce to 40 mg in the morning and 20 mg in the evening.  I also encouraged support stockings.  In January 2023 she had undergone lower extremity venous Doppler study which was negative for DVT.  NT proBNP in January 2024 was 515.  3.  Underlying complete heart block/status post permanent pacemaker: Initial pacemaker insertion June 2009 with generator change out August 2018.  She is followed by Dr. Royann Shivers and was last seen on July 01, 2022.  At that time she was AV pacing with broad QRS at 160 ms.  Presently today ECG shows atrial sensing with ventricular pacing  4. Atypical chest pain: Remotely she had experience nonexertional chest pain.  This has resolved.  5. Hyperlipidemia: She continues to be on pravastatin 40 mg.  Laboratory in July 2021 showed total cholesterol 121, LDL cholesterol 64 with triglycerides 94.  She now sees Dr. Asencion Partridge for primary  care.  Lipid studies on April 30, 2022 showed total cholesterol 134, LDL 59 triglycerides 100 and HDL 54.  6.  History of PAF: She has not had recurrent AF and has been maintaining sinus rhythm.  7.  GERD: Controlled with pantoprazole  8.  Hypothyroidism: currently on levothyroxine 50 mcg daily  9.  Mild intermittent wheezing: I have suggested as needed albuterol   I have recommended she undergo a follow-up 2D echo Doppler study.  I will also schedule her for follow-up laboratory with c-Met, magnesium, CBC and TSH.  She will be seeing her primary physician on May 8.  I have recommended cardiology follow-up with how Meng, PA in 4 weeks.  I will see her in several months for follow-up evaluation.   Medication Adjustments/Labs and Tests Ordered: Current medicines are reviewed at length with the patient today.  Concerns regarding medicines are outlined above.  Medication changes, Labs and Tests ordered today are listed in the Patient Instructions below. Patient Instructions  Medication Instructions:  STOP Furosemide  START Torsemide 40 mg twice daily (take for 4-5 days, monitor swelling if improved you can take 40 mg in the morning and 20 mg in the  evening, if not improved continue to take 40 mg twice daily)  TAKE Albuterol inhaler as instructed   *If you need a refill on your cardiac medications before your next appointment, please call your pharmacy*   Lab Work: CMET, CBC, TSH, MAG today   If you have labs (blood work) drawn today and your tests are completely normal, you will receive your results only by: MyChart Message (if you have MyChart) OR A paper copy in the mail If you have any lab test that is abnormal or we need to change your treatment, we will call you to review the results.   Follow-Up: At [facility], you and your health needs are our priority.  As part of our continuing mission to provide you with exceptional heart care, we have created designated  Provider Care Teams.  These Care Teams include your primary Cardiologist (physician) and Advanced Practice Providers (APPs -  Physician Assistants and Nurse Practitioners) who all work together to provide you with the care you need, when you need it.  We recommend signing up for the patient portal called "MyChart".  Sign up information is provided on this After Visit Summary.  MyChart is used to connect with patients for Virtual Visits (Telemedicine).  Patients are able to view lab/test results, encounter notes, upcoming appointments, etc.  Non-urgent messages can be sent to your provider as well.   To learn more about what you can do with MyChart, go to [email or web address].    Your next appointment:   4 week(s)  Provider:   Azalee Course, PA-C, Bernadene Person, NP, or Carlos Levering, NP      Signed, Nicki Guadalajara, MD  11/01/2022 9:48 AM    [facility] [address], [address], [place], [place]  [address] Phone: [phone]

## 2022-10-25 NOTE — Patient Instructions (Signed)
Medication Instructions:  STOP Furosemide  START Torsemide 40 mg twice daily (take for 4-5 days, monitor swelling if improved you can take 40 mg in the morning and 20 mg in the evening, if not improved continue to take 40 mg twice daily)  TAKE Albuterol inhaler as instructed   *If you need a refill on your cardiac medications before your next appointment, please call your pharmacy*   Lab Work: CMET, CBC, TSH, MAG today   If you have labs (blood work) drawn today and your tests are completely normal, you will receive your results only by: MyChart Message (if you have MyChart) OR A paper copy in the mail If you have any lab test that is abnormal or we need to change your treatment, we will call you to review the results.   Follow-Up: At [facility], you and your health needs are our priority.  As part of our continuing mission to provide you with exceptional heart care, we have created designated Provider Care Teams.  These Care Teams include your primary Cardiologist (physician) and Advanced Practice Providers (APPs -  Physician Assistants and Nurse Practitioners) who all work together to provide you with the care you need, when you need it.  We recommend signing up for the patient portal called "MyChart".  Sign up information is provided on this After Visit Summary.  MyChart is used to connect with patients for Virtual Visits (Telemedicine).  Patients are able to view lab/test results, encounter notes, upcoming appointments, etc.  Non-urgent messages can be sent to your provider as well.   To learn more about what you can do with MyChart, go to [email or web address].    Your next appointment:   4 week(s)  Provider:   Azalee Course, PA-C, Bernadene Person, NP, or Carlos Levering, NP

## 2022-10-26 ENCOUNTER — Encounter (HOSPITAL_BASED_OUTPATIENT_CLINIC_OR_DEPARTMENT_OTHER): Payer: Medicare Other | Admitting: Internal Medicine

## 2022-10-26 LAB — COMPREHENSIVE METABOLIC PANEL
AST: 17 IU/L (ref 0–40)
Albumin/Globulin Ratio: 1.1 — ABNORMAL LOW (ref 1.2–2.2)
Albumin: 4.1 g/dL (ref 3.7–4.7)
Alkaline Phosphatase: 81 IU/L (ref 44–121)
BUN/Creatinine Ratio: 19 (ref 12–28)
Chloride: 102 mmol/L (ref 96–106)
Creatinine, Ser: 1.39 mg/dL — ABNORMAL HIGH (ref 0.57–1.00)
Glucose: 98 mg/dL (ref 70–99)
Potassium: 4.7 mmol/L (ref 3.5–5.2)
Sodium: 142 mmol/L (ref 134–144)

## 2022-10-26 LAB — CBC
Hematocrit: 32.5 % — ABNORMAL LOW (ref 34.0–46.6)
MCV: 89 fL (ref 79–97)
Platelets: 263 10*3/uL (ref 150–450)
RBC: 3.66 x10E6/uL — ABNORMAL LOW (ref 3.77–5.28)
RDW: 12 % (ref 11.7–15.4)
WBC: 13.2 10*3/uL — ABNORMAL HIGH (ref 3.4–10.8)

## 2022-10-26 LAB — MAGNESIUM: Magnesium: 2.3 mg/dL (ref 1.6–2.3)

## 2022-10-27 ENCOUNTER — Encounter: Payer: Self-pay | Admitting: Family Medicine

## 2022-10-27 ENCOUNTER — Ambulatory Visit (INDEPENDENT_AMBULATORY_CARE_PROVIDER_SITE_OTHER): Payer: Medicare Other | Admitting: Family Medicine

## 2022-10-27 VITALS — BP 138/54 | HR 69 | Temp 98.3°F | Ht 59.0 in | Wt 154.2 lb

## 2022-10-27 DIAGNOSIS — R7301 Impaired fasting glucose: Secondary | ICD-10-CM | POA: Diagnosis not present

## 2022-10-27 DIAGNOSIS — N1831 Chronic kidney disease, stage 3a: Secondary | ICD-10-CM | POA: Diagnosis not present

## 2022-10-27 DIAGNOSIS — I5032 Chronic diastolic (congestive) heart failure: Secondary | ICD-10-CM

## 2022-10-27 DIAGNOSIS — D649 Anemia, unspecified: Secondary | ICD-10-CM

## 2022-10-27 DIAGNOSIS — I10 Essential (primary) hypertension: Secondary | ICD-10-CM

## 2022-10-27 DIAGNOSIS — I872 Venous insufficiency (chronic) (peripheral): Secondary | ICD-10-CM | POA: Diagnosis not present

## 2022-10-27 DIAGNOSIS — J9801 Acute bronchospasm: Secondary | ICD-10-CM | POA: Diagnosis not present

## 2022-10-27 NOTE — Progress Notes (Signed)
Subjective  CC:  Chief Complaint  Patient presents with   Hypertension   Chronic diastolic heart failure   Edema    HPI: [name] is a 89 y.o. female who presents to the office today to address the problems listed above in the chief complaint. 89 year old female here for follow-up of blood pressure, chronic kidney disease, diastolic heart failure, chronic edema.  She was recently at cardiology office.  I reviewed notes and recent lab work findings.  She was here last week for chronic venous insufficiency and resolving cellulitis.  Overall she is stable.  No chest pain.  No fevers She complains of cough, postnasal drainage and wheezing.  She says she has had allergies all of her life.  Dr. Tresa Endo recently ordered an albuterol inhaler for her.  She has not yet taken it.  Not taking allergy medicine.  No fevers or chills or sore throat.  No malaise or myalgias Last A1c was 6.0 in November.    Does not eat a lot.  No symptoms of hyperglycemia Reviewed medications. Normocytic anemia chronic kidney disease remained stable. She sees wound care center next week for lower extremity edema and skin care.  No current ulcerations Chronic heart failure: Cardiology changed to torsemide.  Hopefully this will help her extremity edema.  They are monitoring her renal function.  Assessment  1. Essential hypertension   2. Stage 3a chronic kidney disease (CKD) (HCC)   3. Chronic diastolic heart failure (HCC)   4. Impaired fasting glucose   5. Venous insufficiency of both lower extremities   6. Normocytic anemia   7. Bronchospasm      Plan  Chronic medical problems: Blood pressure is controlled.  Chronic kidney disease, heart failure and venous insufficiency are all stable.  Discussed use of torsemide and monitoring weight and lower extremity edema.  Wound care center to help with skin care.  No symptoms of diabetes.  Allergic bronchospasm: Zyrtec or Allegra and albuterol ordered.  Follow up:  November for complete physical 01/03/2023  No orders of the defined types were placed in this encounter.  No orders of the defined types were placed in this encounter.     I reviewed the patients updated PMH, FH, and SocHx.    Patient Active Problem List   Diagnosis Date Noted   Chronic diastolic heart failure (HCC) 03/03/2022    Priority: High   Stage 3a chronic kidney disease (CKD) (HCC) 04/27/2021    Priority: High   SSS (sick sinus syndrome) (HCC) 06/15/2017    Priority: High   PAT (paroxysmal atrial tachycardia) 06/15/2017    Priority: High   Impaired fasting glucose 07/27/2016    Priority: High   CHB (complete heart block) (HCC) s/p pacemaker 05/17/2013    Priority: High   Pacemaker 02/04/2013    Priority: High   Essential hypertension 02/04/2013    Priority: High   Mixed hyperlipidemia 02/04/2013    Priority: High   Osteopenia 04/27/2021    Priority: Medium    Adjustment disorder with anxiety 04/27/2021    Priority: Medium    Early stage nonexudative age-related macular degeneration of right eye 03/06/2020    Priority: Medium    Early stage nonexudative age-related macular degeneration of left eye 03/06/2020    Priority: Medium    Laryngopharyngeal reflux (LPR) 03/11/2017    Priority: Medium    Primary hypothyroidism 07/27/2016    Priority: Medium    Lower extremity edema 11/22/2014    Priority: Medium  Asthma 02/20/2010    Priority: Medium    GERD (gastroesophageal reflux disease) 10/26/2007    Priority: Medium    Chronic cough 10/26/2007    Priority: Medium    Choroidal nevus of right eye 03/06/2020    Priority: Low   Pseudophakia 03/06/2020    Priority: Low   Vitamin D deficiency 07/27/2016    Priority: Low   Allergic rhinitis due to pollen 08/21/2010    Priority: Low   Venous insufficiency of both lower extremities 05/31/2022   Primary insomnia 10/28/2021   Normocytic anemia 07/14/2021   Current Meds  Medication Sig   acetaminophen  (TYLENOL) 500 MG tablet    albuterol (VENTOLIN HFA) 108 (90 Base) MCG/ACT inhaler Inhale 2 puffs into the lungs every 6 (six) hours as needed for wheezing or shortness of breath.   Ascorbic Acid (VITAMIN C) 100 MG tablet 500 mg daily.   aspirin 81 MG chewable tablet Chew 81 mg by mouth at bedtime.   calcium carbonate (TUMS EX) 750 MG chewable tablet Chew 1 tablet by mouth daily as needed for heartburn.   cetirizine (ZYRTEC) 10 MG tablet    cholecalciferol (VITAMIN D3) 25 MCG (1000 UNIT) tablet Take 1 tablet (1,000 Units total) by mouth daily.   conjugated estrogens (PREMARIN) vaginal cream    diltiazem (CARDIZEM CD) 120 MG 24 hr capsule TAKE 1 CAPSULE (120 MG TOTAL) BY MOUTH AT BEDTIME.   docusate sodium (COLACE) 100 MG capsule Take 1 capsule (100 mg total) by mouth 2 (two) times daily.   hydrOXYzine (ATARAX) 10 MG tablet Take 1 tablet (10 mg total) by mouth 3 (three) times daily as needed for itching.   KLOR-CON M20 20 MEQ tablet TAKE 1 TABLET (20 MEQ TOTAL) BY MOUTH DAILY. TAKE AN EXTRA TABLET ONCE DAILY WHEN YOU TAKE THE EXTRA FUROSEMIDE   levothyroxine (SYNTHROID) 25 MCG tablet Take 2 tablets (50 mcg total) by mouth daily.   LORazepam (ATIVAN) 0.5 MG tablet TAKE 1 TABLET BY MOUTH EVERY DAY AS NEEDED FOR ANXIETY   methocarbamol (ROBAXIN) 500 MG tablet Take 1 tablet (500 mg total) by mouth every 8 (eight) hours as needed for muscle spasms.   metoprolol succinate (TOPROL-XL) 25 MG 24 hr tablet TAKE 1 TABLET BY MOUTH EVERYDAY AT BEDTIME   pantoprazole (PROTONIX) 20 MG tablet Take 1 tablet (20 mg total) by mouth daily.   pravastatin (PRAVACHOL) 40 MG tablet TAKE 1 TABLET BY MOUTH EVERY DAY   tiZANidine (ZANAFLEX) 2 MG tablet    torsemide (DEMADEX) 20 MG tablet Take 2 tablets (40 mg total) by mouth 2 (two) times daily.   traMADol (ULTRAM) 50 MG tablet    triamcinolone cream (KENALOG) 0.1 % Apply 1 Application topically 2 (two) times daily. For 2 weeks, then as needed   valsartan (DIOVAN) 160 MG  tablet Take 1 tablet (160 mg total) by mouth daily.    Allergies: Patient is allergic to fluticasone-salmeterol, sulfa antibiotics, sulfonamide derivatives, tetracycline hcl, benzonatate, erythromycin, meloxicam, and sulfamethoxazole. Family History: Patient family history includes Cancer in her father; Stroke in her mother and sister. Social History:  Patient  reports that she has never smoked. She has never used smokeless tobacco. She reports that she does not drink alcohol and does not use drugs.  Review of Systems: Constitutional: Negative for fever malaise or anorexia Cardiovascular: negative for chest pain Respiratory: negative for SOB or persistent cough Gastrointestinal: negative for abdominal pain  Objective  Vitals: BP (!) 138/54   Pulse 69   Temp  98.3 F (36.8 C)   Ht 4\' 11"  (1.499 m)   Wt 154 lb 3.2 oz (69.9 kg)   SpO2 97%   BMI 31.14 kg/m  General: no acute distress , A&Ox3 HEENT: PEERL, conjunctiva normal, neck is supple Cardiovascular:  RRR without murmur or gallop.  Respiratory:  Good breath sounds bilaterally, CTAB with normal respiratory effort Skin:  Warm, no rashes Lab Results  Component Value Date   CREATININE 1.39 (H) 10/25/2022   BUN 26 10/25/2022   NA 142 10/25/2022   K 4.7 10/25/2022   CL 102 10/25/2022   CO2 24 10/25/2022   Lab Results  Component Value Date   WBC 13.2 (H) 10/25/2022   HGB 10.5 (L) 10/25/2022   HCT 32.5 (L) 10/25/2022   MCV 89 10/25/2022   PLT 263 10/25/2022   Lab Results  Component Value Date   TSH 4.240 10/25/2022    Commons side effects, risks, benefits, and alternatives for medications and treatment plan prescribed today were discussed, and the patient expressed understanding of the given instructions. Patient is instructed to call or message via MyChart if he/she has any questions or concerns regarding our treatment plan. No barriers to understanding were identified. We discussed Red Flag symptoms and signs in detail.  Patient expressed understanding regarding what to do in case of urgent or emergency type symptoms.  Medication list was reconciled, printed and provided to the patient in AVS. Patient instructions and summary information was reviewed with the patient as documented in the AVS. This note was prepared with assistance of Dragon voice recognition software. Occasional wrong-word or sound-a-like substitutions may have occurred due to the inherent limitations of voice recognition software

## 2022-10-27 NOTE — Patient Instructions (Signed)
Please return in November for your annual complete physical; please come fasting.   Medication Instructions:  STOP Furosemide  START Torsemide 40 mg twice daily (take for 4-5 days, monitor swelling if improved you can take 40 mg in the morning and 20 mg in the evening, if not improved continue to take 40 mg twice daily)  TAKE Albuterol inhaler as instructed   If you have any questions or concerns, please don't hesitate to send me a message via MyChart or call the office at [phone]. Thank you for visiting with [place] today! It's our pleasure caring for you.

## 2022-11-01 ENCOUNTER — Encounter: Payer: Self-pay | Admitting: Cardiovascular Disease

## 2022-11-04 ENCOUNTER — Encounter (HOSPITAL_BASED_OUTPATIENT_CLINIC_OR_DEPARTMENT_OTHER): Payer: Medicare Other | Attending: Internal Medicine | Admitting: General Surgery

## 2022-11-04 DIAGNOSIS — I5032 Chronic diastolic (congestive) heart failure: Secondary | ICD-10-CM | POA: Diagnosis not present

## 2022-11-04 DIAGNOSIS — N1831 Chronic kidney disease, stage 3a: Secondary | ICD-10-CM | POA: Diagnosis not present

## 2022-11-04 DIAGNOSIS — I872 Venous insufficiency (chronic) (peripheral): Secondary | ICD-10-CM | POA: Insufficient documentation

## 2022-11-04 DIAGNOSIS — R600 Localized edema: Secondary | ICD-10-CM | POA: Diagnosis not present

## 2022-11-04 DIAGNOSIS — Z79899 Other long term (current) drug therapy: Secondary | ICD-10-CM | POA: Diagnosis not present

## 2022-11-04 DIAGNOSIS — L97811 Non-pressure chronic ulcer of other part of right lower leg limited to breakdown of skin: Secondary | ICD-10-CM | POA: Diagnosis not present

## 2022-11-04 DIAGNOSIS — L97821 Non-pressure chronic ulcer of other part of left lower leg limited to breakdown of skin: Secondary | ICD-10-CM | POA: Diagnosis not present

## 2022-11-04 DIAGNOSIS — I130 Hypertensive heart and chronic kidney disease with heart failure and stage 1 through stage 4 chronic kidney disease, or unspecified chronic kidney disease: Secondary | ICD-10-CM | POA: Insufficient documentation

## 2022-11-04 DIAGNOSIS — Z950 Presence of cardiac pacemaker: Secondary | ICD-10-CM | POA: Diagnosis not present

## 2022-11-04 DIAGNOSIS — I442 Atrioventricular block, complete: Secondary | ICD-10-CM | POA: Insufficient documentation

## 2022-11-04 DIAGNOSIS — R7302 Impaired glucose tolerance (oral): Secondary | ICD-10-CM | POA: Insufficient documentation

## 2022-11-05 ENCOUNTER — Other Ambulatory Visit: Payer: Self-pay | Admitting: Cardiovascular Disease

## 2022-11-10 ENCOUNTER — Encounter (HOSPITAL_BASED_OUTPATIENT_CLINIC_OR_DEPARTMENT_OTHER): Payer: Medicare Other | Admitting: General Surgery

## 2022-11-10 DIAGNOSIS — I442 Atrioventricular block, complete: Secondary | ICD-10-CM | POA: Diagnosis not present

## 2022-11-10 DIAGNOSIS — I5032 Chronic diastolic (congestive) heart failure: Secondary | ICD-10-CM | POA: Diagnosis not present

## 2022-11-10 DIAGNOSIS — L97811 Non-pressure chronic ulcer of other part of right lower leg limited to breakdown of skin: Secondary | ICD-10-CM | POA: Diagnosis not present

## 2022-11-10 DIAGNOSIS — N1831 Chronic kidney disease, stage 3a: Secondary | ICD-10-CM | POA: Diagnosis not present

## 2022-11-10 DIAGNOSIS — I130 Hypertensive heart and chronic kidney disease with heart failure and stage 1 through stage 4 chronic kidney disease, or unspecified chronic kidney disease: Secondary | ICD-10-CM | POA: Diagnosis not present

## 2022-11-10 DIAGNOSIS — L97821 Non-pressure chronic ulcer of other part of left lower leg limited to breakdown of skin: Secondary | ICD-10-CM | POA: Diagnosis not present

## 2022-11-19 ENCOUNTER — Encounter (HOSPITAL_BASED_OUTPATIENT_CLINIC_OR_DEPARTMENT_OTHER): Payer: Medicare Other | Admitting: General Surgery

## 2022-11-19 DIAGNOSIS — L97821 Non-pressure chronic ulcer of other part of left lower leg limited to breakdown of skin: Secondary | ICD-10-CM | POA: Diagnosis not present

## 2022-11-19 DIAGNOSIS — L97811 Non-pressure chronic ulcer of other part of right lower leg limited to breakdown of skin: Secondary | ICD-10-CM | POA: Diagnosis not present

## 2022-11-19 DIAGNOSIS — I130 Hypertensive heart and chronic kidney disease with heart failure and stage 1 through stage 4 chronic kidney disease, or unspecified chronic kidney disease: Secondary | ICD-10-CM | POA: Diagnosis not present

## 2022-11-19 DIAGNOSIS — I872 Venous insufficiency (chronic) (peripheral): Secondary | ICD-10-CM | POA: Diagnosis not present

## 2022-11-19 DIAGNOSIS — I5032 Chronic diastolic (congestive) heart failure: Secondary | ICD-10-CM | POA: Diagnosis not present

## 2022-11-19 DIAGNOSIS — I890 Lymphedema, not elsewhere classified: Secondary | ICD-10-CM | POA: Diagnosis not present

## 2022-11-19 DIAGNOSIS — N1831 Chronic kidney disease, stage 3a: Secondary | ICD-10-CM | POA: Diagnosis not present

## 2022-11-19 DIAGNOSIS — I442 Atrioventricular block, complete: Secondary | ICD-10-CM | POA: Diagnosis not present

## 2022-11-22 ENCOUNTER — Encounter (HOSPITAL_BASED_OUTPATIENT_CLINIC_OR_DEPARTMENT_OTHER): Payer: Medicare Other | Attending: General Surgery | Admitting: General Surgery

## 2022-11-22 DIAGNOSIS — I5032 Chronic diastolic (congestive) heart failure: Secondary | ICD-10-CM | POA: Diagnosis not present

## 2022-11-22 DIAGNOSIS — I872 Venous insufficiency (chronic) (peripheral): Secondary | ICD-10-CM | POA: Insufficient documentation

## 2022-11-22 DIAGNOSIS — N1831 Chronic kidney disease, stage 3a: Secondary | ICD-10-CM | POA: Diagnosis not present

## 2022-11-22 DIAGNOSIS — S80829A Blister (nonthermal), unspecified lower leg, initial encounter: Secondary | ICD-10-CM | POA: Insufficient documentation

## 2022-11-22 DIAGNOSIS — J45909 Unspecified asthma, uncomplicated: Secondary | ICD-10-CM | POA: Diagnosis not present

## 2022-11-22 DIAGNOSIS — I130 Hypertensive heart and chronic kidney disease with heart failure and stage 1 through stage 4 chronic kidney disease, or unspecified chronic kidney disease: Secondary | ICD-10-CM | POA: Diagnosis not present

## 2022-11-22 DIAGNOSIS — L97821 Non-pressure chronic ulcer of other part of left lower leg limited to breakdown of skin: Secondary | ICD-10-CM | POA: Diagnosis not present

## 2022-11-22 DIAGNOSIS — E039 Hypothyroidism, unspecified: Secondary | ICD-10-CM | POA: Insufficient documentation

## 2022-11-22 DIAGNOSIS — L97811 Non-pressure chronic ulcer of other part of right lower leg limited to breakdown of skin: Secondary | ICD-10-CM | POA: Diagnosis not present

## 2022-11-22 DIAGNOSIS — I890 Lymphedema, not elsewhere classified: Secondary | ICD-10-CM | POA: Insufficient documentation

## 2022-11-22 DIAGNOSIS — M8580 Other specified disorders of bone density and structure, unspecified site: Secondary | ICD-10-CM | POA: Insufficient documentation

## 2022-11-23 ENCOUNTER — Ambulatory Visit: Payer: Medicare Other | Attending: Physician Assistant | Admitting: Physician Assistant

## 2022-11-23 ENCOUNTER — Encounter: Payer: Self-pay | Admitting: Physician Assistant

## 2022-11-23 VITALS — BP 134/86 | HR 73 | Ht 59.0 in | Wt 150.6 lb

## 2022-11-23 DIAGNOSIS — E785 Hyperlipidemia, unspecified: Secondary | ICD-10-CM | POA: Diagnosis not present

## 2022-11-23 DIAGNOSIS — I890 Lymphedema, not elsewhere classified: Secondary | ICD-10-CM | POA: Insufficient documentation

## 2022-11-23 DIAGNOSIS — Z950 Presence of cardiac pacemaker: Secondary | ICD-10-CM | POA: Diagnosis not present

## 2022-11-23 DIAGNOSIS — I480 Paroxysmal atrial fibrillation: Secondary | ICD-10-CM | POA: Insufficient documentation

## 2022-11-23 DIAGNOSIS — I10 Essential (primary) hypertension: Secondary | ICD-10-CM | POA: Diagnosis not present

## 2022-11-23 DIAGNOSIS — R600 Localized edema: Secondary | ICD-10-CM | POA: Diagnosis not present

## 2022-11-23 NOTE — Progress Notes (Unsigned)
Cardiology Office Note:    Date:  11/25/2022   ID:  [name], DOB Jan 16, 1932, MRN [id number]  PCP:  Willow Ora, MD   [facility] Providers Cardiologist:  Nicki Guadalajara, MD     Referring MD: Willow Ora, MD   Chief Complaint  Patient presents with   Follow-up    Seen for Dr. Tresa Endo    History of Present Illness:    [name] is a 89 y.o. female with a hx of CHB s/p dual-chamber PPM, PAF, extremity edema, GERD, hypertension and hyperlipidemia.  Echocardiogram obtained on 10/23/2014 showed EF 50 to 55%, grade 1 DD, mild MR.  He underwent generator change out procedure on 02/09/2017 after previous device reached ERI.  Echocardiogram obtained on 07/15/2021 showed EF 60 to 65%, grade 1 DD, RVSP 31.5 mmHg, mild LAE.  She was recently seen by Dr. Tresa Endo on 10/25/2022 for bilateral lower extremity edema.  She was on 40 mg twice a day of Lasix.  She was switched to torsemide 40 mg twice a day with instructions to initially reduce the dose to 40 mg a.m. and 20 mg p.m. if her lower extremity edema improved.  Patient presents today for cardiology follow-up accompanied by son.  She continues to have significant lower extremity edema that is being followed by both her PCP and the wound center.  Her legs are wrapped.  According to her son, her leg edema did improve slightly.  She was having increased urinary output on the torsemide.  She is still on 40 mg twice a day of torsemide.  I am hesitant to further increase the dose given her advanced age.  Her lips and mouth feel dry all the time.  I will obtain a basic metabolic panel and proBNP.  If renal function is stable and her proBNP is normal, I likely will leave her on the current dose of diuretic.  Past Medical History:  Diagnosis Date   Allergic rhinitis    Anginal pain (HCC)    Asthmatic bronchitis    Chronic cough    Dysrhythmia    PAF   Esophageal reflux    GERD (gastroesophageal reflux disease)    History of stress test  11/20/2009   This was essentially normal,post-stress EF was hyperdynamic at 73 beats per mins, There was no scar or ischemia.   Hx of echocardiogram 11/05/2010   EF>55% showed a normal systolic function with impaired diastolic dysfuntion and also tissue doppler suggesting increased elevated left atrial pressure, she had moderate LA dilatation and mild LA dilatation. There was calcified mitral apparatus with moderate MR, mild to moderate TR and trace aortic insufficiency.   Hyperlipemia    Hypertension    Hypothyroidism    PAF (paroxysmal atrial fibrillation) (HCC)    Pneumonia     Past Surgical History:  Procedure Laterality Date   CHOLECYSTECTOMY     CYSTOSCOPY WITH BIOPSY N/A 08/29/2020   Procedure: CYSTOSCOPY WITH BIOPSY WITH FULGURATION;  Surgeon: Bjorn Pippin, MD;  Location: [facility];  Service: Urology;  Laterality: N/A;   FOOT SURGERY     right   NASAL SEPTUM SURGERY     PACEMAKER INSERTION  June 1st 2009   St Jude Pineland device, model #4098, serial #[id number], The artial lead is a Lexicographer, the ventricular lead is Runner, broadcasting/film/video.   PPM GENERATOR CHANGEOUT N/A 02/09/2017   Procedure: PPM GENERATOR CHANGEOUT;  Surgeon: Thurmon Fair, MD;  Location: [facility] INVASIVE CV LAB;  Service: Cardiovascular;  Laterality: N/A;   THYROID SURGERY     TONSILLECTOMY     TONSILLECTOMY     VESICOVAGINAL FISTULA CLOSURE W/ TAH      Current Medications: Current Meds  Medication Sig   acetaminophen (TYLENOL) 500 MG tablet    Ascorbic Acid (VITAMIN C) 100 MG tablet 500 mg daily.   aspirin 81 MG chewable tablet Chew 81 mg by mouth at bedtime.   calcium carbonate (TUMS EX) 750 MG chewable tablet Chew 1 tablet by mouth daily as needed for heartburn.   cetirizine (ZYRTEC) 10 MG tablet    cholecalciferol (VITAMIN D3) 25 MCG (1000 UNIT) tablet Take 1 tablet (1,000 Units total) by mouth daily.   conjugated estrogens (PREMARIN) vaginal cream    cromolyn (OPTICROM) 4 % ophthalmic solution    diltiazem  (CARDIZEM CD) 120 MG 24 hr capsule TAKE 1 CAPSULE (120 MG TOTAL) BY MOUTH AT BEDTIME.   docusate sodium (COLACE) 100 MG capsule Take 1 capsule (100 mg total) by mouth 2 (two) times daily.   hydrOXYzine (ATARAX) 10 MG tablet Take 1 tablet (10 mg total) by mouth 3 (three) times daily as needed for itching.   KLOR-CON M20 20 MEQ tablet TAKE 1 TABLET (20 MEQ TOTAL) BY MOUTH DAILY. TAKE AN EXTRA TABLET ONCE DAILY WHEN YOU TAKE THE EXTRA FUROSEMIDE   levothyroxine (SYNTHROID) 25 MCG tablet Take 2 tablets (50 mcg total) by mouth daily.   LORazepam (ATIVAN) 0.5 MG tablet TAKE 1 TABLET BY MOUTH EVERY DAY AS NEEDED FOR ANXIETY   methocarbamol (ROBAXIN) 500 MG tablet Take 1 tablet (500 mg total) by mouth every 8 (eight) hours as needed for muscle spasms.   metoprolol succinate (TOPROL-XL) 25 MG 24 hr tablet TAKE 1 TABLET BY MOUTH EVERYDAY AT BEDTIME   pantoprazole (PROTONIX) 20 MG tablet Take 1 tablet (20 mg total) by mouth daily.   pravastatin (PRAVACHOL) 40 MG tablet TAKE 1 TABLET BY MOUTH EVERY DAY   tiZANidine (ZANAFLEX) 2 MG tablet    torsemide (DEMADEX) 20 MG tablet Take 2 tablets (40 mg total) by mouth 2 (two) times daily.   traMADol (ULTRAM) 50 MG tablet    traZODone (DESYREL) 50 MG tablet Take 0.5-1 tablets (25-50 mg total) by mouth at bedtime as needed for sleep.   triamcinolone cream (KENALOG) 0.1 % Apply 1 Application topically 2 (two) times daily. For 2 weeks, then as needed   valsartan (DIOVAN) 160 MG tablet Take 1 tablet (160 mg total) by mouth daily.     Allergies:   Fluticasone-salmeterol, Sulfa antibiotics, Sulfonamide derivatives, Tetracycline hcl, Benzonatate, Erythromycin, Meloxicam, and Sulfamethoxazole   Social History   Socioeconomic History   Marital status: Widowed    Spouse name: Not on file   Number of children: Not on file   Years of education: Not on file   Highest education level: Not on file  Occupational History    Employer: RETIRED    Comment: Retired Engineer, water at [employer]  Tobacco Use   Smoking status: Never   Smokeless tobacco: Never  Vaping Use   Vaping Use: Never used  Substance and Sexual Activity   Alcohol use: No   Drug use: No   Sexual activity: Not on file  Other Topics Concern   Not on file  Social History Narrative   Not on file   Social Determinants of Health   Financial Resource Strain: Low Risk  (10/27/2021)   Overall Financial Resource Strain (CARDIA)    Difficulty of Paying Living  Expenses: Not hard at all  Food Insecurity: No Food Insecurity (10/27/2021)   Hunger Vital Sign    Worried About Running Out of Food in the Last Year: Never true    Ran Out of Food in the Last Year: Never true  Transportation Needs: No Transportation Needs (10/27/2021)   PRAPARE - Administrator, Civil Service (Medical): No    Lack of Transportation (Non-Medical): No  Physical Activity: Insufficiently Active (10/27/2021)   Exercise Vital Sign    Days of Exercise per Week: 5 days    Minutes of Exercise per Session: 10 min  Stress: No Stress Concern Present (10/27/2021)   [employer] of Occupational Health - Occupational Stress Questionnaire    Feeling of Stress : Not at all  Social Connections: Socially Isolated (10/27/2021)   Social Connection and Isolation Panel [NHANES]    Frequency of Communication with Friends and Family: More than three times a week    Frequency of Social Gatherings with Friends and Family: Twice a week    Attends Religious Services: Never    Database administrator or Organizations: No    Attends Banker Meetings: Never    Marital Status: Widowed     Family History: The patient's family history includes Cancer in her father; Stroke in her mother and sister.  ROS:   Please see the history of present illness.     All other systems reviewed and are negative.  EKGs/Labs/Other Studies Reviewed:    The following studies were reviewed today:  Echo 07/15/2021  1. Left  ventricular ejection fraction, by estimation, is 60 to 65%. The  left ventricle has normal function. The left ventricle has no regional  wall motion abnormalities. There is mild left ventricular hypertrophy.  Left ventricular diastolic parameters  are consistent with Grade I diastolic dysfunction (impaired relaxation).   2. Right ventricular systolic function is normal. The right ventricular  size is normal. The estimated right ventricular systolic pressure is 31.5  mmHg.   3. Left atrial size was mildly dilated.   4. The mitral valve is normal in structure. Trivial mitral valve  regurgitation. No evidence of mitral stenosis.   5. The aortic valve is tricuspid. Aortic valve regurgitation is not  visualized. No aortic stenosis is present.   6. The inferior vena cava is normal in size with greater than 50%  respiratory variability, suggesting right atrial pressure of 3 mmHg.    EKG:  EKG is not ordered today.   Recent Labs: 10/25/2022: ALT 8; Hemoglobin 10.5; Magnesium 2.3; Platelets 263; TSH 4.240 11/23/2022: BUN 42; Creatinine, Ser 1.65; NT-Pro BNP 438; Potassium 4.8; Sodium 142  Recent Lipid Panel    Component Value Date/Time   CHOL 134 04/30/2022 1211   TRIG 100.0 04/30/2022 1211   HDL 54.80 04/30/2022 1211   CHOLHDL 2 04/30/2022 1211   VLDL 20.0 04/30/2022 1211   LDLCALC 59 04/30/2022 1211     Risk Assessment/Calculations:    CHA2DS2-VASc Score = 4   This indicates a 4.8% annual risk of stroke. The patient's score is based upon: CHF History: 0 HTN History: 1 Diabetes History: 0 Stroke History: 0 Vascular Disease History: 0 Age Score: 2 Gender Score: 1          Physical Exam:    VS:  BP 134/86   Pulse 73   Ht 4\' 11"  (1.499 m)   Wt 150 lb 9.6 oz (68.3 kg)   SpO2 98%   BMI 30.42 kg/m  Wt Readings from Last 3 Encounters:  11/23/22 150 lb 9.6 oz (68.3 kg)  10/27/22 154 lb 3.2 oz (69.9 kg)  10/25/22 149 lb (67.6 kg)     GEN:  Well nourished, well  developed in no acute distress HEENT: Normal NECK: No JVD; No carotid bruits LYMPHATICS: No lymphadenopathy CARDIAC: RRR, no murmurs, rubs, gallops RESPIRATORY:  Clear to auscultation without rales, wheezing or rhonchi  ABDOMEN: Soft, non-tender, non-distended MUSCULOSKELETAL:  No edema; No deformity  SKIN: Warm and dry NEUROLOGIC:  Alert and oriented x 3 PSYCHIATRIC:  Normal affect   ASSESSMENT:    1. Lymphedema   2. Pacemaker   3. PAF (paroxysmal atrial fibrillation) (HCC)   4. Essential hypertension   5. Hyperlipidemia LDL goal <70    PLAN:    In order of problems listed above:  Bilateral lower extremity edema: She is currently on 40 mg twice a day of torsemide.  Given her advanced age, I am hesitant to further increase the diuretic dosage.  Will obtain basic metabolic panel and proBNP.  If renal function is stable and proBNP is within normal limit, I would recommend continuing on the current dosage  History of pacemaker: Followed by Dr. Royann Shivers.  She has upcoming visit.  I do not see any remote transmission since September 2022, according to her son, her remote transmitter is broken, the new device she received has also failed to work as well  PAF: No recent recurrence.  Continue diltiazem and metoprolol succinate.  Hypertension: Blood pressure stable  Hyperlipidemia: On pravastatin           Medication Adjustments/Labs and Tests Ordered: Current medicines are reviewed at length with the patient today.  Concerns regarding medicines are outlined above.  Orders Placed This Encounter  Procedures   Pro b natriuretic peptide (BNP)   Basic Metabolic Panel (BMET)   No orders of the defined types were placed in this encounter.   Patient Instructions  Medication Instructions:  NO CHANGES  *If you need a refill on your cardiac medications before your next appointment, please call your pharmacy*   Lab Work: BMP and BNP If you have labs (blood work) drawn today and  your tests are completely normal, you will receive your results only by: MyChart Message (if you have MyChart) OR A paper copy in the mail If you have any lab test that is abnormal or we need to change your treatment, we will call you to review the results.   Testing/Procedures: None   Follow-Up: At [facility], you and your health needs are our priority.  As part of our continuing mission to provide you with exceptional heart care, we have created designated Provider Care Teams.  These Care Teams include your primary Cardiologist (physician) and Advanced Practice Providers (APPs -  Physician Assistants and Nurse Practitioners) who all work together to provide you with the care you need, when you need it.  We recommend signing up for the patient portal called "MyChart".  Sign up information is provided on this After Visit Summary.  MyChart is used to connect with patients for Virtual Visits (Telemedicine).  Patients are able to view lab/test results, encounter notes, upcoming appointments, etc.  Non-urgent messages can be sent to your provider as well.   To learn more about what you can do with MyChart, go to [email or web address].    Your next appointment:   3-4 month(s)  Provider:   Nicki Guadalajara, MD     Signed, Azalee Course, PA  11/25/2022 3:03 PM    [facility]

## 2022-11-23 NOTE — Patient Instructions (Signed)
Medication Instructions:  NO CHANGES  *If you need a refill on your cardiac medications before your next appointment, please call your pharmacy*   Lab Work: BMP and BNP If you have labs (blood work) drawn today and your tests are completely normal, you will receive your results only by: MyChart Message (if you have MyChart) OR A paper copy in the mail If you have any lab test that is abnormal or we need to change your treatment, we will call you to review the results.   Testing/Procedures: None   Follow-Up: At [facility], you and your health needs are our priority.  As part of our continuing mission to provide you with exceptional heart care, we have created designated Provider Care Teams.  These Care Teams include your primary Cardiologist (physician) and Advanced Practice Providers (APPs -  Physician Assistants and Nurse Practitioners) who all work together to provide you with the care you need, when you need it.  We recommend signing up for the patient portal called "MyChart".  Sign up information is provided on this After Visit Summary.  MyChart is used to connect with patients for Virtual Visits (Telemedicine).  Patients are able to view lab/test results, encounter notes, upcoming appointments, etc.  Non-urgent messages can be sent to your provider as well.   To learn more about what you can do with MyChart, go to [email or web address].    Your next appointment:   3-4 month(s)  Provider:   Nicki Guadalajara, MD

## 2022-11-24 LAB — BASIC METABOLIC PANEL
CO2: 26 mmol/L (ref 20–29)
Calcium: 9.2 mg/dL (ref 8.7–10.3)
Glucose: 97 mg/dL (ref 70–99)
Potassium: 4.8 mmol/L (ref 3.5–5.2)

## 2022-11-25 ENCOUNTER — Encounter: Payer: Self-pay | Admitting: Physician Assistant

## 2022-11-25 LAB — BASIC METABOLIC PANEL
BUN/Creatinine Ratio: 25 (ref 12–28)
BUN: 42 mg/dL — ABNORMAL HIGH (ref 8–27)
Chloride: 102 mmol/L (ref 96–106)
Creatinine, Ser: 1.65 mg/dL — ABNORMAL HIGH (ref 0.57–1.00)
Sodium: 142 mmol/L (ref 134–144)
eGFR: 30 mL/min/{1.73_m2} — ABNORMAL LOW (ref >59)

## 2022-11-25 LAB — PRO B NATRIURETIC PEPTIDE: NT-Pro BNP: 438 pg/mL (ref 0–738)

## 2022-11-26 ENCOUNTER — Other Ambulatory Visit: Payer: Self-pay

## 2022-11-26 ENCOUNTER — Encounter (HOSPITAL_BASED_OUTPATIENT_CLINIC_OR_DEPARTMENT_OTHER): Payer: Medicare Other | Admitting: Internal Medicine

## 2022-11-26 DIAGNOSIS — S80829A Blister (nonthermal), unspecified lower leg, initial encounter: Secondary | ICD-10-CM | POA: Diagnosis not present

## 2022-11-26 DIAGNOSIS — I872 Venous insufficiency (chronic) (peripheral): Secondary | ICD-10-CM | POA: Diagnosis not present

## 2022-11-26 DIAGNOSIS — E039 Hypothyroidism, unspecified: Secondary | ICD-10-CM | POA: Diagnosis not present

## 2022-11-26 DIAGNOSIS — L97812 Non-pressure chronic ulcer of other part of right lower leg with fat layer exposed: Secondary | ICD-10-CM | POA: Diagnosis not present

## 2022-11-26 DIAGNOSIS — L97821 Non-pressure chronic ulcer of other part of left lower leg limited to breakdown of skin: Secondary | ICD-10-CM | POA: Diagnosis not present

## 2022-11-26 DIAGNOSIS — Z79899 Other long term (current) drug therapy: Secondary | ICD-10-CM

## 2022-11-26 DIAGNOSIS — I890 Lymphedema, not elsewhere classified: Secondary | ICD-10-CM | POA: Diagnosis not present

## 2022-11-26 DIAGNOSIS — L97811 Non-pressure chronic ulcer of other part of right lower leg limited to breakdown of skin: Secondary | ICD-10-CM | POA: Diagnosis not present

## 2022-11-26 MED ORDER — TORSEMIDE 20 MG PO TABS: ORAL_TABLET | ORAL | 0 refills | Status: DC

## 2022-11-30 NOTE — Progress Notes (Signed)
[name], [name] ([id number]) [phone].pdf Page 1 of 4 Visit Report for 11/04/2022 Abuse Risk Screen Details Patient Name: Date of Service: [name], [name] 11/04/2022 7:30 A M Medical Record Number: [id number] Patient Account Number: [id number] Date of Birth/Sex: Treating RN: [birth year] (89 y.o. [name] Primary Care Elchanan Bob: Asencion Partridge Other Clinician: Referring Travis Mastel: Treating Imari Reen/Extender: Chelsea Aus in Treatment: 0 Abuse Risk Screen Items Answer ABUSE RISK SCREEN: Has anyone close to you tried to hurt or harm you recentlyo No Do you feel uncomfortable with anyone in your familyo No Has anyone forced you do things that you didnt want to doo No Electronic Signature(s) Signed: 11/30/2022 7:49:05 AM By: Brenton Grills Entered By: Brenton Grills on 11/04/2022 07:49:05 -------------------------------------------------------------------------------- Activities of Daily Living Details Patient Name: Date of Service: [name] [name], [name] 11/04/2022 7:30 A M Medical Record Number: [id number] Patient Account Number: [id number] Date of Birth/Sex: Treating RN: 1931-09-15 (89 y.o. [name] Primary Care Kahlen Boyde: Asencion Partridge Other Clinician: Referring Thanvi Blincoe: Treating Lilee Aldea/Extender: Chelsea Aus in Treatment: 0 Activities of Daily Living Items Answer Activities of Daily Living (Please select one for each item) Drive Automobile Not Able T Medications ake Completely Able Use T elephone Completely Able Care for Appearance Completely Able Use T oilet Completely Able Bath / Shower Completely Able Dress Self Completely Able Feed Self Completely Able Walk Completely Able Get In / Out Bed Completely Able Housework Completely Able Prepare Meals Completely Able Handle Money Completely Able Shop for Self Completely Able Electronic Signature(s) Signed: 11/30/2022 7:49:05 AM By: Brenton Grills Entered By: Brenton Grills on 11/04/2022 07:50:02 -------------------------------------------------------------------------------- Education Screening Details Patient Name: Date of Service: [name] [name], [name] 11/04/2022 7:30 A M Medical Record Number: [id number] Patient Account Number: [id number] Date of Birth/Sex: Treating RN: 1931/10/08 (89 y.o. [name] Primary Care Serina Nichter: Asencion Partridge Other Clinician: Referring Quashawn Jewkes: Treating Paulene Tayag/Extender: Chelsea Aus in Treatment: 0 [name], [name] ([id number]) [phone].pdf Page 2 of 4 Learning Preferences/Education Level/Primary Language Learning Preference: Explanation Highest Education Level: College or Above Preferred Language: English Cognitive Barrier Language Barrier: No Translator Needed: No Memory Deficit: No Emotional Barrier: No Cultural/Religious Beliefs Affecting Medical Care: No Physical Barrier Impaired Vision: Yes Impaired Hearing: No Decreased Hand dexterity: No Knowledge/Comprehension Knowledge Level: High Comprehension Level: High Ability to understand written instructions: High Ability to understand verbal instructions: High Motivation Anxiety Level: Calm Cooperation: Cooperative Education Importance: Acknowledges Need Interest in Health Problems: Asks Questions Perception: Coherent Willingness to Engage in Self-Management High Activities: Readiness to Engage in Self-Management High Activities: Electronic Signature(s) Signed: 11/30/2022 7:49:05 AM By: Brenton Grills Entered By: Brenton Grills on 11/04/2022 07:51:43 -------------------------------------------------------------------------------- Fall Risk Assessment Details Patient Name: Date of Service: [name] [name], [name] 11/04/2022 7:30 A M Medical Record Number: [id number] Patient Account Number: [id number] Date of Birth/Sex: Treating RN: 1931/12/02 (89 y.o. [name] Primary  Care Marquitta Persichetti: Asencion Partridge Other Clinician: Referring Azaleah Usman: Treating Medard Decuir/Extender: Chelsea Aus in Treatment: 0 Fall Risk Assessment Items Have you had 2 or more falls in the last 12 monthso 0 No Have you had any fall that resulted in injury in the last 12 monthso 0 No [name] 0 No Secondary diagnosis (Do you have 2 or more medical diagnoseso) 0 No Ambulatory aid None/bed rest/wheelchair/nurse 0 No Crutches/cane/walker 0 No Furniture 0 No Intravenous therapy Access/Saline/Heparin Lock 0 No Gait/Transferring Normal/ bed rest/ wheelchair 0 No Weak (short  steps with or without shuffle, stooped but able to lift head while walking, may seek 0 No support from furniture) Impaired (short steps with shuffle, may have difficulty arising from chair, head down, impaired 0 No balance) Mental Status Oriented to own ability 0 No Overestimates or forgets limitations 0 No Risk Level: Low Risk Score: 0 Hagenow, Jazzlin ([id number]) [id number].pdf Page 3 of 4 Electronic Signature(s) -------------------------------------------------------------------------------- Foot Assessment Details Patient Name: Date of Service: [name] [name], [name] 11/04/2022 7:30 A M Medical Record Number: [id number] Patient Account Number: [id number] Date of Birth/Sex: Treating RN: 1931-07-01 (89 y.o. [name] Primary Care Marijayne Rauth: Asencion Partridge Other Clinician: Referring Cheronda Erck: Treating Reyansh Kushnir/Extender: Chelsea Aus in Treatment: 0 Foot Assessment Items Site Locations + = Sensation present, - = Sensation absent, C = Callus, U = Ulcer R = Redness, W = Warmth, M = Maceration, PU = Pre-ulcerative lesion F = Fissure, S = Swelling, D = Dryness Assessment Right: Left: Other Deformity: No No Prior Foot Ulcer: No No Prior Amputation: No No Charcot Joint: No  No Ambulatory Status: Ambulatory With Help Assistance Device: Walker Gait: Steady Electronic Signature(s) Signed: 11/30/2022 7:49:05 AM By: Brenton Grills Entered By: Brenton Grills on 11/04/2022 07:53:31 -------------------------------------------------------------------------------- Nutrition Risk Screening Details Patient Name: Date of Service: [name] MARCELLE, HEPNER 11/04/2022 7:30 A M Medical Record Number: [id number] Patient Account Number: [id number] Date of Birth/Sex: Treating RN: [birth year] (89 y.o. [name] Primary Care Novalyn Lajara: Asencion Partridge Other Clinician: Referring Dempsey Ahonen: Treating Marthann Abshier/Extender: Chelsea Aus in Treatment: 0 Height (in): 60 Weight (lbs): 149 Body Mass Index (BMI): 29.1 Mazzuca, Camilla ([id number]) [id number] Page 4 of 4 Nutrition Risk Screening Items Score Screening NUTRITION RISK SCREEN: I have an illness or condition that made me change the kind and/or amount of food I eat 0 No I eat fewer than two meals per day 0 No I eat few fruits and vegetables, or milk products 0 No I have three or more drinks of beer, liquor or wine almost every day 0 No I have tooth or mouth problems that make it hard for me to eat 0 No I don't always have enough money to buy the food I need 0 No I eat alone most of the time 0 No I take three or more different prescribed or over-the-counter drugs a day 0 No Without wanting to, I have lost or gained 10 pounds in the last six months 0 No I am not always physically able to shop, cook and/or feed myself 0 No Nutrition Protocols Good Risk Protocol Moderate Risk Protocol High Risk Proctocol Risk Level: Good Risk Score: 0 Electronic Signature(s) Signed: 11/30/2022 7:49:05 AM By: Brenton Grills Entered By: Brenton Grills on 11/04/2022 07:52:06

## 2022-11-30 NOTE — Progress Notes (Signed)
ASTREA, PENT ([id number]) [id number] Page 1 of 8 Visit Report for 11/04/2022 Chief Complaint Document Details Patient Name: Date of Service: PA [name], [name] 11/04/2022 7:30 A M Medical Record Number: [id number] Patient Account Number: [id number] Date of Birth/Sex: Treating RN: [birth year] (89 y.o. F) Primary Care Provider: Asencion Partridge Other Clinician: Referring Provider: Treating Provider/Extender: Chelsea Aus in Treatment: 0 Information Obtained from: Patient Chief Complaint Patient presents for treatment of an open ulcer due to venous insufficiency and likely some degree of lymphedema Electronic Signature(s) Signed: 11/04/2022 2:53:21 PM By: Duanne Guess MD FACS Entered By: Duanne Guess on 11/04/2022 14:53:21 -------------------------------------------------------------------------------- Debridement Details Patient Name: Date of Service: [name], [name] 11/04/2022 7:30 A M Medical Record Number: [id number] Patient Account Number: [id number] Date of Birth/Sex: Treating RN: [birth year] (89 y.o. [name] Primary Care Provider: Asencion Partridge Other Clinician: Referring Provider: Treating Provider/Extender: Chelsea Aus in Treatment: 0 Debridement Performed for Assessment: Wound #2 Left,Anterior Lower Leg Performed By: Physician Duanne Guess, MD Debridement Type: Debridement Severity of Tissue Pre Debridement: Fat layer exposed Level of Consciousness (Pre-procedure): Awake and Alert Pre-procedure Verification/Time Out Yes - 08:36 Taken: Start Time: 08:37 Pain Control: Lidocaine 4% Topical Solution Percent of Wound Bed Debrided: 100% T Area Debrided (cm): otal 9.62 Tissue and other material debrided: Non-Viable, Eschar Level: Non-Viable Tissue Debridement Description: Selective/Open Wound Instrument: Curette Bleeding: Minimum Hemostasis Achieved: Pressure End Time: 08:39 Procedural  Pain: 0 Post Procedural Pain: 0 Response to Treatment: Procedure was tolerated well Level of Consciousness (Post- Awake and Alert procedure): Post Debridement Measurements of Total Wound Length: (cm) 3.5 Width: (cm) 3.5 Depth: (cm) 0.1 Volume: (cm) 0.962 Character of Wound/Ulcer Post Debridement: Stable Severity of Tissue Post Debridement: Limited to breakdown of skin Post Procedure Diagnosis Same as Pre-procedure Notes Scribed for Dr [name] by Brenton Grills RN. [name], [name] ([id number]) [id number] Page 2 of 8 Electronic Signature(s) Signed: 11/04/2022 4:03:18 PM By: Duanne Guess MD FACS Signed: 11/30/2022 7:49:05 AM By: Brenton Grills Entered By: Brenton Grills on 11/04/2022 08:38:22 -------------------------------------------------------------------------------- HPI Details Patient Name: Date of Service: [name], [name] 11/04/2022 7:30 A M Medical Record Number: [id number] Patient Account Number: [id number] Date of Birth/Sex: Treating RN: 05-30-1932 (89 y.o. F) Primary Care Provider: Asencion Partridge Other Clinician: Referring Provider: Treating Provider/Extender: Chelsea Aus in Treatment: 0 History of Present Illness HPI Description: ADMISSION 11/04/2022 This is an 89 year old woman with a past medical history notable for congestive heart failure, complete heart block status post pacemaker placement, impaired glucose tolerance, hypertension, chronic venous insufficiency and bilateral lower extremity edema, CKD stage IIIa, and hypothyroidism. She has struggled with worsening lower extremity edema and recently had her diuretic change from furosemide to torsemide. She was treated in April for cellulitis with a 10-day course of cephalexin. She apparently had a similar issue in December of this past year which responded to [employer] boots and doxycycline. Compression stockings and leg elevation were recommended. She was also referred  to the wound center due to what was described in the electronic medical record and as thickened eschar and scabbed bullae. ABIs in clinic today was 0.99 on the right and 0.92 on the left. She does not wear compression stockings because she finds them constricting and uncomfortable. Electronic Signature(s) Signed: 11/04/2022 2:54:07 PM By: Duanne Guess MD FACS Previous Signature: 11/04/2022 7:49:46 AM Version By: Duanne Guess MD FACS Entered By: Duanne Guess on 11/04/2022 14:54:07 -------------------------------------------------------------------------------- Physical Exam Details Patient Name: Date of Service: [name], [name] 11/04/2022 7:30  A M Medical Record Number: [id number] Patient Account Number: [id number] Date of Birth/Sex: Treating RN: June 24, 1931 (89 y.o. F) Primary Care Provider: Asencion Partridge Other Clinician: Referring Provider: Treating Provider/Extender: Chelsea Aus in Treatment: 0 Constitutional . . . . No acute distress. Respiratory Normal work of breathing on room air. Cardiovascular 2+ pitting edema bilaterally with skin changes of stasis dermatitis. Notes 11/04/2022: She has multiple blisters on her right lower leg; on the left lower leg, there is a thick crust of eschar on the anterior tibial surface. There is dried lymphorrhea crusted on her skin bilaterally. Electronic Signature(s) Signed: 11/04/2022 2:55:24 PM By: Duanne Guess MD FACS Entered By: Duanne Guess on 11/04/2022 14:55:23 -------------------------------------------------------------------------------- Physician Orders Details Patient Name: Date of Service: [name], [name] 11/04/2022 7:30 A M Medical Record Number: [id number] Patient Account Number: [id number] Date of Birth/Sex: Treating RN: [birth year] (89 y.o. [name] Primary Care Provider: Asencion Partridge Other Clinician: Referring Provider: Treating Provider/Extender: Penny Pia Guttenberg, [name] ([id number]) [id number] Page 3 of 8 Weeks in Treatment: 0 Verbal / Phone Orders: No Diagnosis Coding ICD-10 Coding Code Description L97.811 Non-pressure chronic ulcer of other part of right lower leg limited to breakdown of skin L97.821 Non-pressure chronic ulcer of other part of left lower leg limited to breakdown of skin I50.32 Chronic diastolic (congestive) heart failure N18.31 Chronic kidney disease, stage 3a I87.2 Venous insufficiency (chronic) (peripheral) R60.0 Localized edema I10 Essential (primary) hypertension Follow-up Appointments ppointment in 1 week. - Dr [name] Return A Anesthetic (In clinic) Topical Lidocaine 4% applied to wound bed Bathing/ Shower/ Hygiene May shower with protection but do not get wound dressing(s) wet. Protect dressing(s) with water repellant cover (for example, large plastic bag) or a cast cover and may then take shower. Edema Control - Lymphedema / SCD / Other Bilateral Lower Extremities Elevate legs to the level of the heart or above for 30 minutes daily and/or when sitting for 3-4 times a day throughout the day. Avoid standing for long periods of time. Compression stocking or Garment 30-40 mm/Hg pressure to: - Bilateral calamine Unna boot wraps. Wound Treatment Wound #1 - Lower Leg Wound Laterality: Right Cleanser: Vashe 5.8 (oz) 1 x Per Week/30 Days Discharge Instructions: Cleanse the wound with Vashe prior to applying a clean dressing using gauze sponges, not tissue or cotton balls. Peri-Wound Care: Triamcinolone 15 (g) 1 x Per Week/30 Days Discharge Instructions: Use triamcinolone 15 (g) as directed Prim Dressing: Maxorb Extra Ag+ Alginate Dressing, 4x4.75 (in/in) 1 x Per Week/30 Days ary Discharge Instructions: Apply to wound bed as instructed Prim Dressing: Calamine Unna Boot Wraps 1 x Per Week/30 Days ary Discharge Instructions: apply in clinic weekly Secured With: Coban  Self-Adherent Wrap 4x5 (in/yd) 1 x Per Week/30 Days Discharge Instructions: Secure with Coban as directed. Wound #2 - Lower Leg Wound Laterality: Left, Anterior Cleanser: Vashe 5.8 (oz) 1 x Per Week/30 Days Discharge Instructions: Cleanse the wound with Vashe prior to applying a clean dressing using gauze sponges, not tissue or cotton balls. Peri-Wound Care: Triamcinolone 15 (g) 1 x Per Week/30 Days Discharge Instructions: Use triamcinolone 15 (g) as directed Prim Dressing: Maxorb Extra Ag+ Alginate Dressing, 4x4.75 (in/in) 1 x Per Week/30 Days ary Discharge Instructions: Apply to wound bed as instructed Prim Dressing: Calamine Unna Boot Wraps 1 x Per Week/30 Days ary Discharge Instructions: apply in clinic weekly Secured With: Coban Self-Adherent Wrap 4x5 (in/yd) 1 x Per Week/30 Days Discharge Instructions: Secure with Coban as directed. Compression Stockings:  Jobst Farrow Wrap 4000 (DME) Left Leg Compression Amount: 20-30 mmHG Discharge Instructions: Apply bilateral Rosamaria Lints daily as instructed. Apply first thing in the morning, remove at night before bed. Electronic Signature(s) Signed: 11/04/2022 4:03:18 PM By: Duanne Guess MD FACS Entered By: Duanne Guess on 11/04/2022 14:55:51 [name], [name] ([id number]) [id number] Page 4 of 8 -------------------------------------------------------------------------------- Problem List Details Patient Name: Date of Service: PA RAYNIYAH, OVERTURF 11/04/2022 7:30 A M Medical Record Number: [id number] Patient Account Number: [id number] Date of Birth/Sex: Treating RN: 08-Dec-1931 (89 y.o. F) Primary Care Provider: Asencion Partridge Other Clinician: Referring Provider: Treating Provider/Extender: Chelsea Aus in Treatment: 0 Active Problems ICD-10 Encounter Code Description Active Date MDM Diagnosis L97.811 Non-pressure chronic ulcer of other part of right lower leg limited to breakdown 11/04/2022  No Yes of skin L97.821 Non-pressure chronic ulcer of other part of left lower leg limited to breakdown 11/04/2022 No Yes of skin I50.32 Chronic diastolic (congestive) heart failure 11/04/2022 No Yes N18.31 Chronic kidney disease, stage 3a 11/04/2022 No Yes I87.2 Venous insufficiency (chronic) (peripheral) 11/04/2022 No Yes R60.0 Localized edema 11/04/2022 No Yes I10 Essential (primary) hypertension 11/04/2022 No Yes Inactive Problems Resolved Problems Electronic Signature(s) Signed: 11/04/2022 2:52:34 PM By: Duanne Guess MD FACS Previous Signature: 11/04/2022 7:43:54 AM Version By: Duanne Guess MD FACS Entered By: Duanne Guess on 11/04/2022 14:52:34 -------------------------------------------------------------------------------- Progress Note Details Patient Name: Date of Service: [name], [name] 11/04/2022 7:30 A M Medical Record Number: [id number] Patient Account Number: [id number] Date of Birth/Sex: Treating RN: 10-13-1931 (89 y.o. F) Primary Care Provider: Asencion Partridge Other Clinician: Referring Provider: Treating Provider/Extender: Chelsea Aus in Treatment: 0 Subjective Chief Complaint Information obtained from Patient Patient presents for treatment of an open ulcer due to venous insufficiency and likely some degree of lymphedema [name], [name] ([id number]) [id number] Page 5 of 8 History of Present Illness (HPI) ADMISSION 11/04/2022 This is an 89 year old woman with a past medical history notable for congestive heart failure, complete heart block status post pacemaker placement, impaired glucose tolerance, hypertension, chronic venous insufficiency and bilateral lower extremity edema, CKD stage IIIa, and hypothyroidism. She has struggled with worsening lower extremity edema and recently had her diuretic change from furosemide to torsemide. She was treated in April for cellulitis with a 10-day course of cephalexin. She  apparently had a similar issue in December of this past year which responded to [employer] boots and doxycycline. Compression stockings and leg elevation were recommended. She was also referred to the wound center due to what was described in the electronic medical record and as thickened eschar and scabbed bullae. ABIs in clinic today was 0.99 on the right and 0.92 on the left. She does not wear compression stockings because she finds them constricting and uncomfortable. Patient History Information obtained from Chart. Allergies fluticasone, sulfa antibiotics, tetracycline, benzonatate, erythromycin base, meloxicam Family History Cancer - Father, Stroke - Mother,Siblings. Social History Never smoker, Marital Status - Married, Alcohol Use - Never, Drug Use - No History, Caffeine Use - Never. Medical History Respiratory Patient has history of Asthma Cardiovascular Patient has history of Hypertension, Vasculitis - venous insufficiency Medical A Surgical History Notes nd Endocrine Hypothyroidism Genitourinary Stage 3 CKD Musculoskeletal Osteopenia Review of Systems (ROS) Ear/Nose/Mouth/Throat Complains or has symptoms of Chronic sinus problems or rhinitis - allergic rhinitis. Gastrointestinal GERD Integumentary (Skin) Complains or has symptoms of Wounds - Both legs. Objective Constitutional No acute distress. Vitals Time Taken: 7:41 AM, Height: 60 in, Source: Stated, Weight: 149 lbs, Source: Stated, BMI: 29.1,  Temperature: 97.7 F, Pulse: 94 bpm, Respiratory Rate: 20 breaths/min, Blood Pressure: 137/79 mmHg. Respiratory Normal work of breathing on room air. Cardiovascular 2+ pitting edema bilaterally with skin changes of stasis dermatitis. General Notes: 11/04/2022: She has multiple blisters on her right lower leg; on the left lower leg, there is a thick crust of eschar on the anterior tibial surface. There is dried lymphorrhea crusted on her skin bilaterally. Integumentary (Hair,  Skin) Wound #1 status is Open. Original cause of wound was Blister. The date acquired was: 08/20/2022. The wound is located on the Right Lower Leg. The wound measures 0.5cm length x 0.6cm width x 0.1cm depth; 0.236cm^2 area and 0.024cm^3 volume. There is Fat Layer (Subcutaneous Tissue) exposed. There is no tunneling or undermining noted. There is a medium amount of serous drainage noted. The wound margin is distinct with the outline attached to the wound base. There is medium (34-66%) pink granulation within the wound bed. There is a medium (34-66%) amount of necrotic tissue within the wound bed including Adherent Slough. The periwound skin appearance had no abnormalities noted for texture. The periwound skin appearance exhibited: Hemosiderin Staining, Rubor. The periwound skin appearance did not exhibit: Dry/Scaly, Maceration. Periwound temperature was noted as No Abnormality. Wound #2 status is Open. Original cause of wound was Blister. The date acquired was: 08/20/2022. The wound is located on the Left,Anterior Lower Leg. The wound measures 3.5cm length x 3.5cm width x 0.1cm depth; 9.621cm^2 area and 0.962cm^3 volume. There is Fat Layer (Subcutaneous Tissue) exposed. There is a medium amount of serosanguineous drainage noted. The wound margin is distinct with the outline attached to the wound base. There is medium (34- 66%) pink granulation within the wound bed. There is a medium (34-66%) amount of necrotic tissue within the wound bed including Adherent Slough. The periwound skin appearance had no abnormalities noted for texture. The periwound skin appearance exhibited: Hemosiderin Staining, Rubor. KRISLYNN, ESQUIVEL ([id number]) [id number] Page 6 of 8 Assessment Active Problems ICD-10 Non-pressure chronic ulcer of other part of right lower leg limited to breakdown of skin Non-pressure chronic ulcer of other part of left lower leg limited to breakdown of skin Chronic  diastolic (congestive) heart failure Chronic kidney disease, stage 3a Venous insufficiency (chronic) (peripheral) Localized edema Essential (primary) hypertension Procedures Wound #2 Pre-procedure diagnosis of Wound #2 is a Venous Leg Ulcer located on the Left,Anterior Lower Leg .Severity of Tissue Pre Debridement is: Fat layer exposed. There was a Selective/Open Wound Non-Viable Tissue Debridement with a total area of 9.62 sq cm performed by Duanne Guess, MD. With the following instrument(s): Curette to remove Non-Viable tissue/material. Material removed includes Eschar after achieving pain control using Lidocaine 4% Topical Solution. No specimens were taken. A time out was conducted at 08:36, prior to the start of the procedure. A Minimum amount of bleeding was controlled with Pressure. The procedure was tolerated well with a pain level of 0 throughout and a pain level of 0 following the procedure. Post Debridement Measurements: 3.5cm length x 3.5cm width x 0.1cm depth; 0.962cm^3 volume. Character of Wound/Ulcer Post Debridement is stable. Severity of Tissue Post Debridement is: Limited to breakdown of skin. Post procedure Diagnosis Wound #2: Same as Pre-Procedure General Notes: Scribed for Dr [name] by Brenton Grills RN.Marland Kitchen Plan Follow-up Appointments: Return Appointment in 1 week. - Dr [name] Anesthetic: (In clinic) Topical Lidocaine 4% applied to wound bed Bathing/ Shower/ Hygiene: May shower with protection but do not get wound dressing(s) wet. Protect dressing(s) with water repellant cover (for example,  large plastic bag) or a cast cover and may then take shower. Edema Control - Lymphedema / SCD / Other: Elevate legs to the level of the heart or above for 30 minutes daily and/or when sitting for 3-4 times a day throughout the day. Avoid standing for long periods of time. Compression stocking or Garment 30-40 mm/Hg pressure to: - Bilateral calamine Unna boot wraps. WOUND #1: -  Lower Leg Wound Laterality: Right Cleanser: Vashe 5.8 (oz) 1 x Per Week/30 Days Discharge Instructions: Cleanse the wound with Vashe prior to applying a clean dressing using gauze sponges, not tissue or cotton balls. Peri-Wound Care: Triamcinolone 15 (g) 1 x Per Week/30 Days Discharge Instructions: Use triamcinolone 15 (g) as directed Prim Dressing: Maxorb Extra Ag+ Alginate Dressing, 4x4.75 (in/in) 1 x Per Week/30 Days ary Discharge Instructions: Apply to wound bed as instructed Prim Dressing: Calamine Unna Boot Wraps 1 x Per Week/30 Days ary Discharge Instructions: apply in clinic weekly Secured With: Coban Self-Adherent Wrap 4x5 (in/yd) 1 x Per Week/30 Days Discharge Instructions: Secure with Coban as directed. WOUND #2: - Lower Leg Wound Laterality: Left, Anterior Cleanser: Vashe 5.8 (oz) 1 x Per Week/30 Days Discharge Instructions: Cleanse the wound with Vashe prior to applying a clean dressing using gauze sponges, not tissue or cotton balls. Peri-Wound Care: Triamcinolone 15 (g) 1 x Per Week/30 Days Discharge Instructions: Use triamcinolone 15 (g) as directed Prim Dressing: Maxorb Extra Ag+ Alginate Dressing, 4x4.75 (in/in) 1 x Per Week/30 Days ary Discharge Instructions: Apply to wound bed as instructed Prim Dressing: Calamine Unna Boot Wraps 1 x Per Week/30 Days ary Discharge Instructions: apply in clinic weekly Secured With: Coban Self-Adherent Wrap 4x5 (in/yd) 1 x Per Week/30 Days Discharge Instructions: Secure with Coban as directed. Com pression Stockings: Jobst Farrow Wrap 4000 (DME) Compression Amount: 20-30 mmHg (left) Discharge Instructions: Apply bilateral Rosamaria Lints daily as instructed. Apply first thing in the morning, remove at night before bed. 11/04/2022: Patient is an 89 year old woman with congestive heart failure and what appears to be chronic venous insufficiency and potentially some element of lymphedema. She has multiple blisters on her right lower leg; on  the left lower leg, there is a thick crust of eschar on the anterior tibial surface. There is dried lymphorrhea crusted on her skin bilaterally. I used a curette to debride the thick crusted eschar off of her left anterior tibial surface. No open wound was appreciated at this site. The dried lymphorrhea on the leg, however suggest that there is still an ongoing process here. I did not open the blisters on her right lower leg. We are going to apply silver alginate and calamine based Unna boots bilaterally. She said that she has a lot of trouble with itching so we will also add triamcinolone to her skin. I discussed options for long-term edema control and I think she would probably do best with Fabian November wraps so we will order her a pair of these. She will follow-up in 1 week. [name], [name] ([id number]) [id number] Page 7 of 8 Electronic Signature(s) Signed: 11/04/2022 2:58:18 PM By: Duanne Guess MD FACS Entered By: Duanne Guess on 11/04/2022 14:58:18 -------------------------------------------------------------------------------- HxROS Details Patient Name: Date of Service: [name], [name] 11/04/2022 7:30 A M Medical Record Number: [id number] Patient Account Number: [id number] Date of Birth/Sex: Treating RN: [birth year] (89 y.o. [name] Primary Care Provider: Asencion Partridge Other Clinician: Referring Provider: Treating Provider/Extender: Chelsea Aus in Treatment: 0 Information Obtained From Chart Ear/Nose/Mouth/Throat Complaints and Symptoms: Positive for:  Chronic sinus problems or rhinitis - allergic rhinitis Integumentary (Skin) Complaints and Symptoms: Positive for: Wounds - Both legs Respiratory Medical History: Positive for: Asthma Cardiovascular Medical History: Positive for: Hypertension; Vasculitis - venous insufficiency Gastrointestinal Complaints and Symptoms: Review of System Notes: GERD Endocrine Medical  History: Past Medical History Notes: Hypothyroidism Genitourinary Medical History: Past Medical History Notes: Stage 3 CKD Musculoskeletal Medical History: Past Medical History Notes: Osteopenia Immunizations Pneumococcal Vaccine: Received Pneumococcal Vaccination: No Implantable Devices Yes Family and Social History Cancer: Yes - Father; Stroke: Yes - Mother,Siblings; Never smoker; Marital Status - Married; Alcohol Use: Never; Drug Use: No History; Caffeine Use: Never; Financial Concerns: No; Food, Clothing or Shelter Needs: No; Support System Lacking: No; Transportation Concerns: No [place]wood, [name] ([id number]) [id number] Page 8 of 8 Electronic Signature(s) Signed: 11/04/2022 4:03:18 PM By: Duanne Guess MD FACS Signed: 11/30/2022 7:49:05 AM By: Brenton Grills Entered By: Brenton Grills on 11/04/2022 07:48:51 -------------------------------------------------------------------------------- SuperBill Details Patient Name: Date of Service: [name], [name] 11/04/2022 Medical Record Number: [id number] Patient Account Number: [id number] Date of Birth/Sex: Treating RN: 03/03/1932 (89 y.o. [name] Primary Care Provider: Asencion Partridge Other Clinician: Referring Provider: Treating Provider/Extender: Chelsea Aus in Treatment: 0 Diagnosis Coding ICD-10 Codes Code Description [phone] Non-pressure chronic ulcer of other part of right lower leg limited to breakdown of skin L97.821 Non-pressure chronic ulcer of other part of left lower leg limited to breakdown of skin I50.32 Chronic diastolic (congestive) heart failure N18.31 Chronic kidney disease, stage 3a I87.2 Venous insufficiency (chronic) (peripheral) R60.0 Localized edema I10 Essential (primary) hypertension Facility Procedures : CPT4 Code: [id number] Description: WOUND CARE VISIT-LEV 3 NEW PT Modifier: 25 Quantity: 1 : CPT4 Code: [id number] Description: 75643 -  DEBRIDE WOUND 1ST 20 SQ CM OR < ICD-10 Diagnosis Description L97.821 Non-pressure chronic ulcer of other part of left lower leg limited to breakdown o Modifier: f skin Quantity: 1 Physician Procedures : CPT4 Code Description Modifier [id number] - WC PHYS LEVEL 4 - NEW PT 25 ICD-10 Diagnosis Description L97.811 Non-pressure chronic ulcer of other part of right lower leg limited to breakdown of skin L97.821 Non-pressure chronic ulcer of other part of  left lower leg limited to breakdown of skin I50.32 Chronic diastolic (congestive) heart failure I87.2 Venous insufficiency (chronic) (peripheral) Quantity: 1 : [id number] - WC PHYS DEBR WO ANESTH 20 SQ CM ICD-10 Diagnosis Description L97.821 Non-pressure chronic ulcer of other part of left lower leg limited to breakdown of skin Quantity: 1 Electronic Signature(s) Signed: 11/04/2022 2:59:04 PM By: Duanne Guess MD FACS Entered By: Duanne Guess on 11/04/2022 14:59:04

## 2022-11-30 NOTE — Progress Notes (Signed)
[name], [name] ([id number]) [id number] Page 1 of 1 Visit Report for 11/10/2022 SuperBill Details Patient Name: Date of Service: PA KYM, SCANNELL 11/10/2022 Medical Record Number: [id number] Patient Account Number: [id number] Date of Birth/Sex: Treating RN: 02-Apr-1932 (89 y.o. 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 ICD-10 Diagnosis Description L97.811 Non-pressure chronic ulcer of other part of right lower leg limited to breakdown of skin L97.821 Non-pressure chronic ulcer of other part of left lower leg limited to breakdown of skin Electronic Signature(s) Signed: 11/10/2022 3:48:28 PM By: Duanne Guess MD FACS Signed: 11/30/2022 7:50:28 AM By: Brenton Grills Entered By: Brenton Grills on 11/10/2022 14:31:29

## 2022-11-30 NOTE — Progress Notes (Signed)
[name], [name] ([id number]) [id number] Page 1 of 9 Visit Report for 11/04/2022 Allergy List Details Patient Name: Date of Service: PA ELIZETH, WEINRICH 11/04/2022 7:30 A M Medical Record Number: [id number] Patient Account Number: [id number] Date of Birth/Sex: Treating RN: March 23, 1932 (89 y.o. Gevena Mart Primary Care Kiamesha Samet: Asencion Partridge Other Clinician: Referring Pete Merten: Treating Laurieanne Galloway/Extender: Penny Pia Weeks in Treatment: 0 Allergies Active Allergies fluticasone sulfa antibiotics Type: Medication tetracycline benzonatate erythromycin base meloxicam Allergy Notes Electronic Signature(s) Signed: 11/30/2022 7:49:05 AM By: Brenton Grills Entered By: Brenton Grills on 11/04/2022 07:43:34 -------------------------------------------------------------------------------- Arrival Information Details Patient Name: Date of Service: PA [name], [name] 11/04/2022 7:30 A M Medical Record Number: [id number] Patient Account Number: [id number] Date of Birth/Sex: Treating RN: 1932-03-01 (89 y.o. Gevena Mart Primary Care Sequan Auxier: Asencion Partridge Other Clinician: Referring Pasqualina Colasurdo: Treating Stevin Bielinski/Extender: Chelsea Aus in Treatment: 0 Visit Information Patient Arrived: Cloyde Reams Time: 07:38 Accompanied By: spouse Transfer Assistance: None Patient Identification Verified: Yes Secondary Verification Process Completed: Yes Patient Requires Transmission-Based Precautions: No Patient Has Alerts: No Electronic Signature(s) Signed: 11/30/2022 7:49:05 AM By: Brenton Grills Entered By: Brenton Grills on 11/04/2022 07:39:14 -------------------------------------------------------------------------------- Clinic Level of Care Assessment Details Patient Name: Date of Service: PA [facility], Rozanna 11/04/2022 7:30 A M Medical Record Number: [id number] Patient Account Number: [id number] Date of Birth/Sex: Treating  RN: 1931-08-18 (89 y.o. Gevena Mart Primary Care Lilliann Rossetti: Asencion Partridge Other Clinician: Referring Christoper Bushey: Treating Brandilee Pies/Extender: Penny Pia [place], Kathie Rhodes ([id number]) [id number] Page 2 of 9 Weeks in Treatment: 0 Clinic Level of Care Assessment Items TOOL 1 Quantity Score X- 1 0 Use when EandM and Procedure is performed on INITIAL visit ASSESSMENTS - Nursing Assessment / Reassessment X- 1 20 General Physical Exam (combine w/ comprehensive assessment (listed just below) when performed on new pt. evals) X- 1 25 Comprehensive Assessment (HX, ROS, Risk Assessments, Wounds Hx, etc.) ASSESSMENTS - Wound and Skin Assessment / Reassessment X- 1 10 Dermatologic / Skin Assessment (not related to wound area) ASSESSMENTS - Ostomy and/or Continence Assessment and Care []  - 0 Incontinence Assessment and Management []  - 0 Ostomy Care Assessment and Management (repouching, etc.) PROCESS - Coordination of Care X - Simple Patient / Family Education for ongoing care 1 15 []  - 0 Complex (extensive) Patient / Family Education for ongoing care X- 1 10 Staff obtains Chiropractor, Records, T Results / Process Orders est []  - 0 Staff telephones HHA, Nursing Homes / Clarify orders / etc []  - 0 Routine Transfer to another Facility (non-emergent condition) []  - 0 Routine Hospital Admission (non-emergent condition) []  - 0 New Admissions / Manufacturing engineer / Ordering NPWT Apligraf, etc. , []  - 0 Emergency Hospital Admission (emergent condition) PROCESS - Special Needs []  - 0 Pediatric / Minor Patient Management []  - 0 Isolation Patient Management []  - 0 Hearing / Language / Visual special needs []  - 0 Assessment of Community assistance (transportation, D/C planning, etc.) []  - 0 Additional assistance / Altered mentation []  - 0 Support Surface(s) Assessment (bed, cushion, seat, etc.) INTERVENTIONS - Miscellaneous []  -  0 External ear exam []  - 0 Patient Transfer (multiple staff / Nurse, adult / Similar devices) []  - 0 Simple Staple / Suture removal (25 or less) []  - 0 Complex Staple / Suture removal (26 or more) []  - 0 Hypo/Hyperglycemic Management (do not check if billed separately) X- 1 15 Ankle / Brachial Index (ABI) - do not check if billed separately Has the patient been seen at the hospital  within the last three years: Yes Total Score: 95 Level Of Care: New/Established - Level 3 Electronic Signature(s) Signed: 11/30/2022 7:49:05 AM By: Brenton Grills Entered By: Brenton Grills on 11/04/2022 09:08:55 -------------------------------------------------------------------------------- Encounter Discharge Information Details Patient Name: Date of Service: PA [name], [name] 11/04/2022 7:30 A M Medical Record Number: [id number] Patient Account Number: [id number] Date of Birth/Sex: Treating RN: 10-May-1932 (89 y.o. Gevena Mart Primary Care Kelse Ploch: Asencion Partridge Other Clinician: Referring Edmar Blankenburg: Treating Seiji Wiswell/Extender: Penny Pia Clark, Kathie Rhodes ([id number]) [id number] Page 3 of 9 Weeks in Treatment: 0 Encounter Discharge Information Items Post Procedure Vitals Discharge Condition: Stable Temperature (F): 98.1 Ambulatory Status: [name] Pulse (bpm): 80 Discharge Destination: Home Respiratory Rate (breaths/min): 18 Transportation: Private Auto Blood Pressure (mmHg): 128/72 Accompanied By: son Schedule Follow-up Appointment: Yes Clinical Summary of Care: Patient Declined Electronic Signature(s) Signed: 11/30/2022 7:49:05 AM By: Brenton Grills Entered By: Brenton Grills on 11/04/2022 09:15:33 -------------------------------------------------------------------------------- Lower Extremity Assessment Details Patient Name: Date of Service: PA [name], [name] 11/04/2022 7:30 A M Medical Record Number: [id number] Patient Account Number:  [id number] Date of Birth/Sex: Treating RN: Nov 05, 1931 (89 y.o. Gevena Mart Primary Care Chauncy Mangiaracina: Asencion Partridge Other Clinician: Referring Dushawn Pusey: Treating Maryori Weide/Extender: Penny Pia Weeks in Treatment: 0 Edema Assessment Assessed: [Left: No] [Right: No] [Left: Edema] [Right: :] Calf Left: Right: Point of Measurement: From Medial Instep 41 cm 41 cm Ankle Left: Right: Point of Measurement: From Medial Instep 27.5 cm 27.5 cm Vascular Assessment Pulses: Dorsalis Pedis Palpable: [Left:Yes] [Right:Yes] Blood Pressure: Brachial: [Left:137] [Right:137] Ankle: [Left:Dorsalis Pedis: 135 0.99] [Right:Dorsalis Pedis: 126 0.92] Electronic Signature(s) Signed: 11/30/2022 7:49:05 AM By: Brenton Grills Entered By: Brenton Grills on 11/04/2022 08:03:09 -------------------------------------------------------------------------------- Multi Wound Chart Details Patient Name: Date of Service: PA [name], [name] 11/04/2022 7:30 A M Medical Record Number: [id number] Patient Account Number: [id number] Date of Birth/Sex: Treating RN: Apr 10, 1932 (89 y.o. F) Primary Care Joann Kulpa: Asencion Partridge Other Clinician: Referring Jordynne Mccown: Treating Lorana Maffeo/Extender: Chelsea Aus in Treatment: 0 Vital Signs Height(in): 60 Pulse(bpm): 94 Weight(lbs): 149 Blood Pressure(mmHg): 137/79 Body Mass Index(BMI): 29.1 Temperature(F): 97.7 Fraiser, Kaylani ([id number]) [id number] Page 4 of 9 Respiratory Rate(breaths/min): 20 [1:Photos:] [N/A:N/A] Right Lower Leg Left, Anterior Lower Leg N/A Wound Location: Blister Blister N/A Wounding Event: Venous Leg Ulcer Venous Leg Ulcer N/A Primary Etiology: Asthma, Hypertension, Vasculitis Asthma, Hypertension, Vasculitis N/A Comorbid History: 08/20/2022 08/20/2022 N/A Date Acquired: 0 0 N/A Weeks of Treatment: Open Open N/A Wound Status: No No N/A Wound Recurrence: 0.5x0.6x0.1 3.5x3.5x0.1  N/A Measurements L x W x D (cm) 0.236 9.621 N/A A (cm) : rea 0.024 0.962 N/A Volume (cm) : Full Thickness Without Exposed Full Thickness Without Exposed N/A Classification: Support Structures Support Structures Medium Medium N/A Exudate A mount: Serous Serosanguineous N/A Exudate Type: amber red, brown N/A Exudate Color: Distinct, outline attached Distinct, outline attached N/A Wound Margin: Medium (34-66%) Medium (34-66%) N/A Granulation A mount: Pink Pink N/A Granulation Quality: Medium (34-66%) Medium (34-66%) N/A Necrotic A mount: Fat Layer (Subcutaneous Tissue): Yes Fat Layer (Subcutaneous Tissue): Yes N/A Exposed Structures: Medium (34-66%) N/A N/A Epithelialization: N/A Debridement - Selective/Open Wound N/A Debridement: Pre-procedure Verification/Time Out N/A 08:36 N/A Taken: N/A Lidocaine 4% Topical Solution N/A Pain Control: N/A Necrotic/Eschar N/A Tissue Debrided: N/A Non-Viable Tissue N/A Level: N/A 9.62 N/A Debridement A (sq cm): rea N/A Curette N/A Instrument: N/A Minimum N/A Bleeding: N/A Pressure N/A Hemostasis A chieved: N/A 0 N/A Procedural Pain: N/A 0 N/A Post Procedural Pain: N/A Procedure was tolerated well N/A Debridement  Treatment Response: N/A 3.5x3.5x0.1 N/A Post Debridement Measurements L x W x D (cm) N/A 0.962 N/A Post Debridement Volume: (cm) No Abnormalities Noted No Abnormalities Noted N/A Periwound Skin Texture: Maceration: No N/A Periwound Skin Moisture: Dry/Scaly: No Hemosiderin Staining: Yes Hemosiderin Staining: Yes N/A Periwound Skin Color: Rubor: Yes Rubor: Yes No Abnormality N/A N/A Temperature: N/A Debridement N/A Procedures Performed: Treatment Notes Wound #1 (Lower Leg) Wound Laterality: Right Cleanser Vashe 5.8 (oz) Discharge Instruction: Cleanse the wound with Vashe prior to applying a clean dressing using gauze sponges, not tissue or cotton balls. Peri-Wound Care Triamcinolone 15 (g) Discharge  Instruction: Use triamcinolone 15 (g) as directed Topical Primary Dressing Maxorb Extra Ag+ Alginate Dressing, 4x4.75 (in/in) Discharge Instruction: Apply to wound bed as instructed Calamine Henriette Combs Wraps Discharge Instruction: apply in clinic weekly Gurley, Kathie Rhodes ([id number]) [id number] Page 5 of 9 Secondary Dressing Secured With Coban Self-Adherent Wrap 4x5 (in/yd) Discharge Instruction: Secure with Coban as directed. Compression Wrap Compression Stockings Add-Ons Wound #2 (Lower Leg) Wound Laterality: Left, Anterior Cleanser Vashe 5.8 (oz) Discharge Instruction: Cleanse the wound with Vashe prior to applying a clean dressing using gauze sponges, not tissue or cotton balls. Peri-Wound Care Triamcinolone 15 (g) Discharge Instruction: Use triamcinolone 15 (g) as directed Topical Primary Dressing Maxorb Extra Ag+ Alginate Dressing, 4x4.75 (in/in) Discharge Instruction: Apply to wound bed as instructed Calamine Henriette Combs Wraps Discharge Instruction: apply in clinic weekly Secondary Dressing Secured With Coban Self-Adherent Wrap 4x5 (in/yd) Discharge Instruction: Secure with Coban as directed. Compression Wrap Compression Stockings Jobst Farrow Wrap 4000 Quantity: 1 Left Leg Compression Amount: 20-30 mmHg Discharge Instruction: Apply bilateral Rosamaria Lints daily as instructed. Apply first thing in the morning, remove at night before bed. Add-Ons Electronic Signature(s) Signed: 11/04/2022 2:52:54 PM By: Duanne Guess MD FACS Entered By: Duanne Guess on 11/04/2022 14:52:54 -------------------------------------------------------------------------------- Multi-Disciplinary Care Plan Details Patient Name: Date of Service: PA [name], [name] 11/04/2022 7:30 A M Medical Record Number: [id number] Patient Account Number: [id number] Date of Birth/Sex: Treating RN: Jun 17, 1932 (89 y.o. Gevena Mart Primary Care Charlotte Fidalgo: Asencion Partridge Other  Clinician: Referring Tramaine Snell: Treating Gerson Fauth/Extender: Chelsea Aus in Treatment: 0 Active Inactive Wound/Skin Impairment Nursing Diagnoses: Impaired tissue integrity Goals: Patient/caregiver will verbalize understanding of skin care regimen Date Initiated: 11/04/2022 Target Resolution Date: 12/20/2022 Goal Status: Active DEJUAN, DAIGNEAULT ([id number]) [id number] Page 6 of 9 Interventions: Assess patient/caregiver ability to obtain necessary supplies Assess patient/caregiver ability to perform ulcer/skin care regimen upon admission and as needed Assess ulceration(s) every visit Provide education on ulcer and skin care Screen for HBO Treatment Activities: Skin care regimen initiated : 11/04/2022 Topical wound management initiated : 11/04/2022 Notes: Electronic Signature(s) Signed: 11/30/2022 7:49:05 AM By: Brenton Grills Entered By: Brenton Grills on 11/04/2022 08:18:49 -------------------------------------------------------------------------------- Pain Assessment Details Patient Name: Date of Service: PA [name], [name] 11/04/2022 7:30 A M Medical Record Number: [id number] Patient Account Number: [id number] Date of Birth/Sex: Treating RN: 04-03-1932 (89 y.o. Gevena Mart Primary Care Amilio Zehnder: Asencion Partridge Other Clinician: Referring Tarini Carrier: Treating Hriday Stai/Extender: Chelsea Aus in Treatment: 0 Active Problems Location of Pain Severity and Description of Pain Patient Has Paino No Site Locations Pain Management and Medication Current Pain Management: Electronic Signature(s) Signed: 11/30/2022 7:49:05 AM By: Brenton Grills Entered By: Brenton Grills on 11/04/2022 08:23:53 -------------------------------------------------------------------------------- Patient/Caregiver Education Details Patient Name: Date of Service: PA [name], [name] 5/16/2024andnbsp7:30 A M Medical Record Number:  [id number] Patient Account Number: [id number] Date of Birth/Gender: Treating RN: 1932/02/10 (89 y.o. Gevena Mart Primary  Care Physician: Asencion Partridge Other Clinician: Referring Physician: Treating Physician/Extender: Chelsea Aus in Treatment: 0 Corralejo, Kathie Rhodes ([id number]) [id number] Page 7 of 9 Education Assessment Education Provided To: Patient and Caregiver Education Topics Provided Wound/Skin Impairment: Methods: Explain/Verbal Responses: State content correctly Electronic Signature(s) Signed: 11/30/2022 7:49:05 AM By: Brenton Grills Entered By: Brenton Grills on 11/04/2022 08:19:07 -------------------------------------------------------------------------------- Wound Assessment Details Patient Name: Date of Service: PA [name], [name] 11/04/2022 7:30 A M Medical Record Number: [id number] Patient Account Number: [id number] Date of Birth/Sex: Treating RN: Apr 21, 1932 (89 y.o. Gevena Mart Primary Care Letita Prentiss: Asencion Partridge Other Clinician: Referring Verbie Babic: Treating Ermine Stebbins/Extender: Penny Pia Weeks in Treatment: 0 Wound Status Wound Number: 1 Primary Etiology: Venous Leg Ulcer Wound Location: Right Lower Leg Wound Status: Open Wounding Event: Blister Comorbid History: Asthma, Hypertension, Vasculitis Date Acquired: 08/20/2022 Weeks Of Treatment: 0 Clustered Wound: No Photos Wound Measurements Length: (cm) 0.5 Width: (cm) 0.6 Depth: (cm) 0.1 Area: (cm) 0.236 Volume: (cm) 0.024 % Reduction in Area: % Reduction in Volume: Epithelialization: Medium (34-66%) Tunneling: No Undermining: No Wound Description Classification: Full Thickness Without Exposed Suppor Wound Margin: Distinct, outline attached Exudate Amount: Medium Exudate Type: Serous Exudate Color: amber t Structures Foul Odor After Cleansing: No Slough/Fibrino No Wound Bed Granulation Amount: Medium (34-66%) Exposed  Structure Granulation Quality: Pink Fat Layer (Subcutaneous Tissue) Exposed: Yes Necrotic Amount: Medium (34-66%) Necrotic Quality: Adherent [facility] Color Vivier, Brookelin ([id number]) [id number] Page 8 of 9 No Abnormalities Noted: Yes No Abnormalities Noted: No Hemosiderin Staining: Yes Moisture Rubor: Yes No Abnormalities Noted: No Dry / Scaly: No Temperature / Pain Maceration: No Temperature: No Abnormality Electronic Signature(s) Signed: 11/30/2022 7:49:05 AM By: Brenton Grills Entered By: Brenton Grills on 11/04/2022 08:20:43 -------------------------------------------------------------------------------- Wound Assessment Details Patient Name: Date of Service: PA [name], [name] 11/04/2022 7:30 A M Medical Record Number: [id number] Patient Account Number: [id number] Date of Birth/Sex: Treating RN: July 20, 1931 (89 y.o. Gevena Mart Primary Care Kasmira Cacioppo: Asencion Partridge Other Clinician: Referring Ger Ringenberg: Treating Diannie Willner/Extender: Penny Pia Weeks in Treatment: 0 Wound Status Wound Number: 2 Primary Etiology: Venous Leg Ulcer Wound Location: Left, Anterior Lower Leg Wound Status: Open Wounding Event: Blister Comorbid History: Asthma, Hypertension, Vasculitis Date Acquired: 08/20/2022 Weeks Of Treatment: 0 Clustered Wound: No Photos Wound Measurements Length: (cm) Width: (cm) Depth: (cm) Area: (cm) Volume: (cm) 3.5 % Reduction in Area: 3.5 % Reduction in Volume: 0.1 9.621 0.962 Wound Description Classification: Full Thickness Without Exposed Suppor Wound Margin: Distinct, outline attached Exudate Amount: Medium Exudate Type: Serosanguineous Exudate Color: red, brown t Structures Foul Odor After Cleansing: No Slough/Fibrino No Wound Bed Granulation Amount: Medium (34-66%) Exposed Structure Granulation Quality: Pink Fat Layer (Subcutaneous Tissue) Exposed: Yes Necrotic Amount:  Medium (34-66%) Necrotic Quality: Adherent Slough Periwound Skin Texture Texture Color No Abnormalities Noted: Yes No Abnormalities Noted: No Hemosiderin Staining: Yes Moisture Rubor: Yes No Abnormalities Noted: No Electronic Signature(s) Treichler, Destine ([id number]) [id number] Page 9 of 9 Signed: 11/30/2022 7:49:05 AM By: Brenton Grills Entered By: Brenton Grills on 11/04/2022 08:22:12 -------------------------------------------------------------------------------- Vitals Details Patient Name: Date of Service: PA [name], [name] 11/04/2022 7:30 A M Medical Record Number: [id number] Patient Account Number: [id number] Date of Birth/Sex: Treating RN: Jan 06, 1932 (89 y.o. Gevena Mart Primary Care Trystan Akhtar: Asencion Partridge Other Clinician: Referring Zamaria Brazzle: Treating Zadaya Cuadra/Extender: Chelsea Aus in Treatment: 0 Vital Signs Time Taken: 07:41 Temperature (F): 97.7 Height (in): 60 Pulse (bpm): 94 Source: Stated Respiratory Rate (breaths/min): 20 Weight (lbs): 149 Blood Pressure (  mmHg): 137/79 Source: Stated Reference Range: 80 - 120 mg / dl Body Mass Index (BMI): 29.1 Electronic Signature(s) Signed: 11/30/2022 7:49:05 AM By: Brenton Grills Entered By: Brenton Grills on 11/04/2022 07:42:48

## 2022-11-30 NOTE — Progress Notes (Signed)
TORI, HOSICK ([id number]) [id number] Page 1 of 3 Visit Report for 11/10/2022 Arrival Information Details Patient Name: Date of Service: PA DOROTHE, MITTEN 11/10/2022 1:30 PM Medical Record Number: [id number] Patient Account Number: [id number] Date of Birth/Sex: Treating RN: 27-Feb-1932 (89 y.o. Gevena Mart Primary Care Jordy Verba: Asencion Partridge Other Clinician: Referring Kaysie Michelini: Treating Angelyn Osterberg/Extender: Chelsea Aus in Treatment: 0 Visit Information History Since Last Visit All ordered tests and consults were completed: Yes Patient Arrived: [name] Added or deleted any medications: No Arrival Time: 14:02 Any new allergies or adverse reactions: No Accompanied By: spouse Had a fall or experienced change in No Transfer Assistance: None activities of daily living that may affect Patient Requires Transmission-Based Precautions: No risk of falls: Patient Has Alerts: No Signs or symptoms of abuse/neglect since last visito No Hospitalized since last visit: No Implantable device outside of the clinic excluding No cellular tissue based products placed in the center since last visit: Has Dressing in Place as Prescribed: Yes Pain Present Now: No Electronic Signature(s) Signed: 11/30/2022 7:50:28 AM By: Brenton Grills Entered By: Brenton Grills on 11/10/2022 14:03:21 -------------------------------------------------------------------------------- Encounter Discharge Information Details Patient Name: Date of Service: [name], Shirrell 11/10/2022 1:30 PM Medical Record Number: [id number] Patient Account Number: [id number] Date of Birth/Sex: Treating RN: [birth year] (89 y.o. Gevena Mart Primary Care Amberly Livas: Asencion Partridge Other Clinician: Referring Tieasha Larsen: Treating Verbon Giangregorio/Extender: Chelsea Aus in Treatment: 0 Encounter Discharge Information Items Discharge Condition: Stable Ambulatory Status:  Walker Discharge Destination: Home Transportation: Private Auto Accompanied By: son Schedule Follow-up Appointment: Yes Clinical Summary of Care: Patient Declined Electronic Signature(s) Signed: 11/30/2022 7:50:28 AM By: Brenton Grills Entered By: Brenton Grills on 11/10/2022 14:31:10 -------------------------------------------------------------------------------- Patient/Caregiver Education Details Patient Name: Date of Service: PA LEODA, SORCE 5/22/2024andnbsp1:30 PM Medical Record Number: [id number] Patient Account Number: [id number] Date of Birth/Gender: Treating RN: [birth year] (89 y.o. Gevena Mart Primary Care Physician: Asencion Partridge Other Clinician: Referring Physician: Treating Physician/Extender: Chelsea Aus in Treatment: 0 Education Assessment Mildred, Kathie Rhodes ([id number]) [id number] Page 2 of 3 Education Provided To: Patient and Caregiver Education Topics Provided Wound/Skin Impairment: Methods: Explain/Verbal Responses: State content correctly Electronic Signature(s) Signed: 11/30/2022 7:50:28 AM By: Brenton Grills Entered By: Brenton Grills on 11/10/2022 14:30:48 -------------------------------------------------------------------------------- Wound Assessment Details Patient Name: Date of Service: [name], Shantice 11/10/2022 1:30 PM Medical Record Number: [id number] Patient Account Number: [id number] Date of Birth/Sex: Treating RN: 04-16-1932 (89 y.o. Gevena Mart Primary Care Wang [place]: Asencion Partridge Other Clinician: Referring Densel Kronick: Treating Ruffus Kamaka/Extender: Penny Pia Weeks in Treatment: 0 Wound Status Wound Number: 1 Primary Etiology: Venous Leg Ulcer Wound Location: Right Lower Leg Wound Status: Open Wounding Event: Blister Comorbid History: Asthma, Hypertension, Vasculitis Date Acquired: 08/20/2022 Weeks Of Treatment: 0 Clustered Wound: No Wound Measurements Length: (cm)  0.5 Width: (cm) 0.6 Depth: (cm) 0.1 Area: (cm) 0.236 Volume: (cm) 0.024 % Reduction in Area: 0% % Reduction in Volume: 0% Epithelialization: Medium (34-66%) Tunneling: No Undermining: No Wound Description Classification: Full Thickness Without Exposed Suppor Wound Margin: Distinct, outline attached Exudate Amount: Medium Exudate Type: Serous Exudate Color: amber t Structures Foul Odor After Cleansing: No Slough/Fibrino No Wound Bed Granulation Amount: Medium (34-66%) Exposed Structure Granulation Quality: Pink Fat Layer (Subcutaneous Tissue) Exposed: Yes Necrotic Amount: Medium (34-66%) Necrotic Quality: Adherent Slough Periwound Skin Texture Texture Color No Abnormalities Noted: Yes No Abnormalities Noted: No Hemosiderin Staining: Yes Moisture Rubor: Yes No Abnormalities Noted: No Dry / Scaly: No Temperature / Pain Maceration: No Temperature: No Abnormality  Electronic Signature(s) Signed: 11/30/2022 7:50:28 AM By: Brenton Grills Entered By: Brenton Grills on 11/10/2022 14:04:43 -------------------------------------------------------------------------------- Wound Assessment Details Patient Name: Date of Service: [name], Cherrish 11/10/2022 1:30 PM Gomer, Kathie Rhodes ([id number]) [id number] Page 3 of 3 Medical Record Number: [id number] Patient Account Number: [id number] Date of Birth/Sex: Treating RN: 1931-11-05 (89 y.o. Gevena Mart Primary Care Maricela Kawahara: Asencion Partridge Other Clinician: Referring Ulysee Fyock: Treating Yukiko Minnich/Extender: Penny Pia Weeks in Treatment: 0 Wound Status Wound Number: 2 Primary Etiology: Venous Leg Ulcer Wound Location: Left, Anterior Lower Leg Wound Status: Open Wounding Event: Blister Comorbid History: Asthma, Hypertension, Vasculitis Date Acquired: 08/20/2022 Weeks Of Treatment: 0 Clustered Wound: No Wound Measurements Length: (cm) 3.5 Width: (cm) 3.5 Depth: (cm) 0.1 Area: (cm)  9.621 Volume: (cm) 0.962 % Reduction in Area: 0% % Reduction in Volume: 0% Tunneling: No Undermining: No Wound Description Classification: Full Thickness Without Exposed Support Structures Wound Margin: Distinct, outline attached Exudate Amount: Medium Exudate Type: Serosanguineous Exudate Color: red, brown Foul Odor After Cleansing: No Slough/Fibrino No Wound Bed Granulation Amount: Medium (34-66%) Exposed Structure Granulation Quality: Pink Fat Layer (Subcutaneous Tissue) Exposed: Yes Necrotic Amount: Medium (34-66%) Necrotic Quality: Adherent Slough Periwound Skin Texture Texture Color No Abnormalities Noted: Yes No Abnormalities Noted: No Hemosiderin Staining: Yes Moisture Rubor: Yes No Abnormalities Noted: No Electronic Signature(s) Signed: 11/30/2022 7:50:28 AM By: Brenton Grills Entered By: Brenton Grills on 11/10/2022 14:05:00 -------------------------------------------------------------------------------- Vitals Details Patient Name: Date of Service: [name], Arie 11/10/2022 1:30 PM Medical Record Number: [id number] Patient Account Number: [id number] Date of Birth/Sex: Treating RN: 27-Jan-1932 (89 y.o. Gevena Mart Primary Care Catalino Plascencia: Asencion Partridge Other Clinician: Referring Tharun Cappella: Treating Thadeus Gandolfi/Extender: Chelsea Aus in Treatment: 0 Vital Signs Time Taken: 14:00 Temperature (F): 97.7 Height (in): 60 Pulse (bpm): 94 Weight (lbs): 149 Respiratory Rate (breaths/min): 20 Body Mass Index (BMI): 29.1 Blood Pressure (mmHg): 137/79 Reference Range: 80 - 120 mg / dl Electronic Signature(s) Signed: 11/30/2022 7:50:28 AM By: Brenton Grills Entered By: Brenton Grills on 11/10/2022 14:04:11

## 2022-12-01 ENCOUNTER — Other Ambulatory Visit: Payer: Self-pay

## 2022-12-01 DIAGNOSIS — Z79899 Other long term (current) drug therapy: Secondary | ICD-10-CM

## 2022-12-01 NOTE — Progress Notes (Signed)
[name], [name] ([id number]) [id number] Page 1 of 10 Visit Report for 11/26/2022 Arrival Information Details Patient Name: Date of Service: PA [name] 11/26/2022 2:45 PM Medical Record Number: [id number] Patient Account Number: [id number] Date of Birth/Sex: Treating RN: May 27, 1932 (89 y.o. F) Primary Care Maleka Contino: Asencion Partridge Other Clinician: Referring Maesyn Frisinger: Treating Zykeem Bauserman/Extender: Luellen Pucker in Treatment: 3 Visit Information History Since Last Visit All ordered tests and consults were completed: No Patient Arrived: [name] Added or deleted any medications: No Arrival Time: 14:33 Any new allergies or adverse reactions: No Accompanied By: son Had a fall or experienced change in No Transfer Assistance: None activities of daily living that may affect Patient Identification Verified: Yes risk of falls: Secondary Verification Process Completed: Yes Signs or symptoms of abuse/neglect since last visito No Patient Requires Transmission-Based Precautions: No Hospitalized since last visit: No Patient Has Alerts: No Implantable device outside of the clinic excluding No cellular tissue based products placed in the center since last visit: Has Dressing in Place as Prescribed: Yes Has Compression in Place as Prescribed: Yes Pain Present Now: No Electronic Signature(s) Signed: 11/26/2022 4:38:17 PM By: Zenaida Deed RN, BSN Entered By: Zenaida Deed on 11/26/2022 14:53:57 -------------------------------------------------------------------------------- Compression Therapy Details Patient Name: Date of Service: PA [name] 11/26/2022 2:45 PM Medical Record Number: [id number] Patient Account Number: [id number] Date of Birth/Sex: Treating RN: 02-10-1932 (89 y.o. [name] Primary Care Jacolyn Joaquin: Asencion Partridge Other Clinician: Referring Ciaran Begay: Treating Quintell Bonnin/Extender: Luellen Pucker in  Treatment: 3 Compression Therapy Performed for Wound Assessment: Wound #1 Right Lower Leg Performed By: Clinician Zenaida Deed, RN Compression Type: Henriette Combs Post Procedure Diagnosis Same as Pre-procedure Electronic Signature(s) Signed: 11/26/2022 4:38:17 PM By: Zenaida Deed RN, BSN Entered By: Zenaida Deed on 11/26/2022 15:13:26 -------------------------------------------------------------------------------- Encounter Discharge Information Details Patient Name: Date of Service: PA [name] 11/26/2022 2:45 PM Medical Record Number: [id number] Patient Account Number: [id number] Date of Birth/Sex: Treating RN: 06-Mar-1932 (88 y.o. [name] Primary Care Dasan Hardman: Asencion Partridge Other Clinician: Referring Nichols Corter: Treating Sher Shampine/Extender: Luellen Pucker in Treatment: 3 Encounter Discharge Information Items Discharge Condition: Stable Ambulatory Status: Ambulatory Discharge Destination: [name], [name] ([id number]) [id number] Page 2 of 10 Transportation: Private Auto Accompanied By: son Schedule Follow-up Appointment: Yes Clinical Summary of Care: Patient Declined Electronic Signature(s) Signed: 11/26/2022 4:38:17 PM By: Zenaida Deed RN, BSN Entered By: Zenaida Deed on 11/26/2022 15:38:23 -------------------------------------------------------------------------------- Lower Extremity Assessment Details Patient Name: Date of Service: PA [name] 11/26/2022 2:45 PM Medical Record Number: [id number] Patient Account Number: [id number] Date of Birth/Sex: Treating RN: [birth year] (89 y.o. [name] Primary Care Ceil Roderick: Asencion Partridge Other Clinician: Referring Jamaine Quintin: Treating Torunn Chancellor/Extender: Luellen Pucker in Treatment: 3 Edema Assessment Assessed: [Left: No] [Right: No] Edema: [Left: Yes] [Right: Yes] Calf Left: Right: Point of Measurement: From Medial Instep 35 cm 36  cm Ankle Left: Right: Point of Measurement: From Medial Instep 24 cm 24 cm Knee To Floor Left: Right: From Medial Instep 36 cm 36 cm Vascular Assessment Pulses: Dorsalis Pedis Palpable: [Left:No] [Right:No] Electronic Signature(s) Signed: 11/30/2022 5:04:24 PM By: Zenaida Deed RN, BSN Previous Signature: 11/26/2022 4:38:17 PM Version By: Zenaida Deed RN, BSN Entered By: Zenaida Deed on 11/30/2022 15:21:32 -------------------------------------------------------------------------------- Multi Wound Chart Details Patient Name: Date of Service: PA [name] 11/26/2022 2:45 PM Medical Record Number: [id number] Patient Account Number: [id number] Date of Birth/Sex: Treating RN: 1931/08/03 (89 y.o. F) Primary Care Sheanna Dail: Asencion Partridge Other Clinician: Referring Raylynn Hersh: Treating Anglea Gordner/Extender:  Vanessa Mamers Weeks in Treatment: 3 Vital Signs Height(in): 60 Pulse(bpm): 79 Weight(lbs): 149 Blood Pressure(mmHg): 136/76 Body Mass Index(BMI): 29.1 Temperature(F): 97.9 Respiratory Rate(breaths/min): 20 [1:Photos:] [name] ([id number]) [1:Photos: No Photos] [3:No Photos] [[id number] Page 3 of 10] Right Lower Leg Right, Anterior Lower Leg Right, Medial Lower Leg Wound Location: Blister Gradually Appeared Gradually Appeared Wounding Event: Venous Leg Ulcer Lymphedema Lymphedema Primary Etiology: Asthma, Hypertension, Vasculitis Asthma, Hypertension, Vasculitis Asthma, Hypertension, Vasculitis Comorbid History: 08/20/2022 11/19/2022 11/19/2022 Date Acquired: 3 0 0 Weeks of Treatment: Open Open Open Wound Status: No No No Wound Recurrence: 0.3x0.3x0.1 0.1x0.1x0.1 0.3x0.3x0.1 Measurements L x W x D (cm) 0.071 0.008 0.071 A (cm) : rea 0.007 0.001 0.007 Volume (cm) : 69.90% 99.30% 91.00% % Reduction in A rea: 70.80% 99.20% 91.10% % Reduction in Volume: Full Thickness Without Exposed Partial Thickness Partial  Thickness Classification: Support Structures Small Small Small Exudate Amount: Serous Serous Serous Exudate Type: amber amber amber Exudate Color: Flat and Intact Flat and Intact Flat and Intact Wound Margin: Large (67-100%) None Present (0%) Large (67-100%) Granulation Amount: Red N/A Pink Granulation Quality: None Present (0%) None Present (0%) None Present (0%) Necrotic Amount: Fat Layer (Subcutaneous Tissue): Yes Fat Layer (Subcutaneous Tissue): Yes Fat Layer (Subcutaneous Tissue): Yes Exposed Structures: Fascia: No Fascia: No Fascia: No Tendon: No Tendon: No Tendon: No Muscle: No Muscle: No Muscle: No Joint: No Joint: No Joint: No Bone: No Bone: No Bone: No Medium (34-66%) Large (67-100%) Medium (34-66%) Epithelialization: No Abnormalities Noted No Abnormalities Noted No Abnormalities Noted Periwound Skin Texture: Maceration: No No Abnormalities Noted No Abnormalities Noted Periwound Skin Moisture: Dry/Scaly: No Hemosiderin Staining: Yes Erythema: Yes Erythema: Yes Periwound Skin Color: Rubor: No N/A Circumferential N/A Erythema Location: N/A Decreased N/A Erythema Change: No Abnormality No Abnormality No Abnormality Temperature: N/A N/A Yes Tenderness on Palpation: Compression Therapy N/A N/A Procedures Performed: Treatment Notes Wound #1 (Lower Leg) Wound Laterality: Right Cleanser Peri-Wound Care Sween Lotion (Moisturizing lotion) Discharge Instruction: Apply moisturizing lotion as directed Topical Primary Dressing Maxorb Extra Ag+ Alginate Dressing, 2x2 (in/in) Discharge Instruction: Apply to wound bed as instructed Secondary Dressing Secured With Compression Wrap Unnaboot w/Calamine, 4x10 (in/yd) Discharge Instruction: Apply Unnaboot as directed. Compression Stockings Jobst Farrow Wrap 4000 Quantity: 1 Right Leg Compression Amount: 30-40 mmHg Discharge Instruction: Apply Renee Pain daily as instructed. Apply first thing in the  morning, remove at night before bed. Add-Ons LALANIA, TOLAR ([id number]) [id number] Page 4 of 10 Wound #3 (Lower Leg) Wound Laterality: Right, Anterior Cleanser Peri-Wound Care Triamcinolone 15 (g) Discharge Instruction: Use triamcinolone 15 (g) as directed Sween Lotion (Moisturizing lotion) Discharge Instruction: Apply moisturizing lotion as directed Topical Primary Dressing Maxorb Extra Ag+ Alginate Dressing, 2x2 (in/in) Discharge Instruction: Apply to wound bed as instructed Secondary Dressing Woven Gauze Sponge, Non-Sterile 4x4 in Discharge Instruction: Apply over primary dressing as directed. Secured With Compression Wrap Unnaboot w/Calamine, 4x10 (in/yd) Discharge Instruction: Apply Unnaboot as directed. Compression Stockings Add-Ons Wound #4 (Lower Leg) Wound Laterality: Right, Medial Cleanser Peri-Wound Care Triamcinolone 15 (g) Discharge Instruction: Use triamcinolone 15 (g) as directed Sween Lotion (Moisturizing lotion) Discharge Instruction: Apply moisturizing lotion as directed Topical Primary Dressing Maxorb Extra Ag+ Alginate Dressing, 2x2 (in/in) Discharge Instruction: Apply to wound bed as instructed Secondary Dressing Woven Gauze Sponge, Non-Sterile 4x4 in Discharge Instruction: Apply over primary dressing as directed. Secured With Compression Wrap Unnaboot w/Calamine, 4x10 (in/yd) Discharge Instruction: Apply Unnaboot as directed. Compression Stockings Add-Ons Electronic Signature(s) Signed: 11/26/2022 4:34:22 PM By: Baltazar Najjar MD  Entered By: Baltazar Najjar on 11/26/2022 15:55:52 -------------------------------------------------------------------------------- Multi-Disciplinary Care Plan Details Patient Name: Date of Service: PA SADYE, KIERNAN 11/26/2022 2:45 PM Medical Record Number: [id number] Patient Account Number: [id number] Date of Birth/Sex: Treating RN: Nov 06, 1931 (89 y.o. [name] Primary Care Jahmiya Guidotti:  Asencion Partridge Other Clinician: Referring Wendell Fiebig: Treating Aster Eckrich/Extender: Vanessa  [place], Kathie Rhodes ([id number]) [phone].pdf Page 5 of 10 Weeks in Treatment: 3 Multidisciplinary Care Plan reviewed with physician Active Inactive Venous Leg Ulcer Nursing Diagnoses: Knowledge deficit related to disease process and management Goals: Patient will maintain optimal edema control Date Initiated: 11/22/2022 Target Resolution Date: 12/20/2022 Goal Status: Active Interventions: Assess peripheral edema status every visit. Compression as ordered Provide education on venous insufficiency Treatment Activities: Therapeutic compression applied : 11/22/2022 Notes: Electronic Signature(s) Signed: 11/26/2022 4:38:17 PM By: Zenaida Deed RN, BSN Entered By: Zenaida Deed on 11/26/2022 15:01:18 -------------------------------------------------------------------------------- Pain Assessment Details Patient Name: Date of Service: [name], Kamariah 11/26/2022 2:45 PM Medical Record Number: [id number] Patient Account Number: [id number] Date of Birth/Sex: Treating RN: 06-Dec-1931 (89 y.o. F) Primary Care Lorenzo Pereyra: Asencion Partridge Other Clinician: Referring Stephaniemarie Stoffel: Treating Kalven Ganim/Extender: Luellen Pucker in Treatment: 3 Active Problems Location of Pain Severity and Description of Pain Patient Has Paino No Site Locations Character of Pain Describe the Pain: Tender Pain Management and Medication Current Pain Management: Electronic Signature(s) Feil, Amandalee ([id number]) [id number] Page 6 of 10 Signed: 11/26/2022 4:38:17 PM By: Zenaida Deed RN, BSN Entered By: Zenaida Deed on 11/26/2022 14:54:10 -------------------------------------------------------------------------------- Patient/Caregiver Education Details Patient Name: Date of Service: PA [facility], [place] 6/7/2024andnbsp2:45 PM Medical Record Number:  [id number] Patient Account Number: [id number] Date of Birth/Gender: Treating RN: Nov 12, 1931 (89 y.o. [name] Primary Care Physician: Asencion Partridge Other Clinician: Referring Physician: Treating Physician/Extender: Luellen Pucker in Treatment: 3 Education Assessment Education Provided To: Patient Education Topics Provided Venous: Methods: Explain/Verbal Responses: Reinforcements needed, State content correctly Wound/Skin Impairment: Methods: Explain/Verbal Responses: Reinforcements needed, State content correctly Electronic Signature(s) Signed: 11/26/2022 4:38:17 PM By: Zenaida Deed RN, BSN Entered By: Zenaida Deed on 11/26/2022 15:01:50 -------------------------------------------------------------------------------- Wound Assessment Details Patient Name: Date of Service: [name], Claretta 11/26/2022 2:45 PM Medical Record Number: [id number] Patient Account Number: [id number] Date of Birth/Sex: Treating RN: 1931-09-27 (89 y.o. Billy Coast, Bonita Quin Primary Care Kerby Borner: Asencion Partridge Other Clinician: Referring Tonni Mansour: Treating Juventino Pavone/Extender: Luellen Pucker in Treatment: 3 Wound Status Wound Number: 1 Primary Etiology: Venous Leg Ulcer Wound Location: Right Lower Leg Wound Status: Open Wounding Event: Blister Comorbid History: Asthma, Hypertension, Vasculitis Date Acquired: 08/20/2022 Weeks Of Treatment: 3 Clustered Wound: No Wound Measurements Length: (cm) 0.3 Width: (cm) 0.3 Depth: (cm) 0.1 Area: (cm) 0.071 Volume: (cm) 0.007 % Reduction in Area: 69.9% % Reduction in Volume: 70.8% Epithelialization: Medium (34-66%) Tunneling: No Undermining: No Wound Description Classification: Full Thickness Without Exposed Suppor Wound Margin: Flat and Intact Exudate Amount: Small Exudate Type: Serous Exudate Color: amber t Structures Foul Odor After Cleansing: No Slough/Fibrino No Wound Bed Granulation Amount: Large  (67-100%) Exposed Structure Granulation Quality: Red Fascia Exposed: No Tosi, Shai ([id number]) [id number] Page 7 of 10 Necrotic Amount: None Present (0%) Fat Layer (Subcutaneous Tissue) Exposed: Yes Tendon Exposed: No Muscle Exposed: No Joint Exposed: No Bone Exposed: No Periwound Skin Texture Texture Color No Abnormalities Noted: Yes No Abnormalities Noted: No Hemosiderin Staining: Yes Moisture Rubor: No No Abnormalities Noted: No Dry / Scaly: No Temperature / Pain Maceration: No Temperature: No Abnormality Treatment Notes Wound #1 (Lower Leg) Wound Laterality: Right Cleanser  Peri-Wound Care Sween Lotion (Moisturizing lotion) Discharge Instruction: Apply moisturizing lotion as directed Topical Primary Dressing Maxorb Extra Ag+ Alginate Dressing, 2x2 (in/in) Discharge Instruction: Apply to wound bed as instructed Secondary Dressing Secured With Compression Wrap Unnaboot w/Calamine, 4x10 (in/yd) Discharge Instruction: Apply Unnaboot as directed. Compression Stockings Jobst Farrow Wrap 4000 Quantity: 1 Right Leg Compression Amount: 30-40 mmHg Discharge Instruction: Apply Renee Pain daily as instructed. Apply first thing in the morning, remove at night before bed. Add-Ons Electronic Signature(s) Signed: 11/26/2022 4:38:17 PM By: Zenaida Deed RN, BSN Entered By: Zenaida Deed on 11/26/2022 14:58:47 -------------------------------------------------------------------------------- Wound Assessment Details Patient Name: Date of Service: [name], Savanah 11/26/2022 2:45 PM Medical Record Number: [id number] Patient Account Number: [id number] Date of Birth/Sex: Treating RN: 03-Aug-1931 (89 y.o. [name] Primary Care Pleasant Britz: Asencion Partridge Other Clinician: Referring Idelia Caudell: Treating Dajion Bickford/Extender: Luellen Pucker in Treatment: 3 Wound Status Wound Number: 3 Primary Etiology: Lymphedema Wound Location:  Right, Anterior Lower Leg Wound Status: Open Wounding Event: Gradually Appeared Comorbid History: Asthma, Hypertension, Vasculitis Date Acquired: 11/19/2022 Weeks Of Treatment: 0 Clustered Wound: No Wound Measurements Length: (cm) 0.1 Width: (cm) 0.1 Depth: (cm) 0.1 Area: (cm) 0.008 Stepka, Anedra ([id number]) Volume: (cm) 0.001 % Reduction in Area: 99.3% % Reduction in Volume: 99.2% Epithelialization: Large (67-100%) Tunneling: No [phone].pdf Page 8 of 10 Undermining: No Wound Description Classification: Partial Thickness Wound Margin: Flat and Intact Exudate Amount: Small Exudate Type: Serous Exudate Color: amber Foul Odor After Cleansing: No Slough/Fibrino No Wound Bed Granulation Amount: None Present (0%) Exposed Structure Necrotic Amount: None Present (0%) Fascia Exposed: No Fat Layer (Subcutaneous Tissue) Exposed: Yes Tendon Exposed: No Muscle Exposed: No Joint Exposed: No Bone Exposed: No Periwound Skin Texture Texture Color No Abnormalities Noted: Yes No Abnormalities Noted: No Erythema: Yes Moisture Erythema Location: Circumferential No Abnormalities Noted: Yes Erythema Change: Decreased Temperature / Pain Temperature: No Abnormality Treatment Notes Wound #3 (Lower Leg) Wound Laterality: Right, Anterior Cleanser Peri-Wound Care Triamcinolone 15 (g) Discharge Instruction: Use triamcinolone 15 (g) as directed Sween Lotion (Moisturizing lotion) Discharge Instruction: Apply moisturizing lotion as directed Topical Primary Dressing Maxorb Extra Ag+ Alginate Dressing, 2x2 (in/in) Discharge Instruction: Apply to wound bed as instructed Secondary Dressing Woven Gauze Sponge, Non-Sterile 4x4 in Discharge Instruction: Apply over primary dressing as directed. Secured With Compression Wrap Unnaboot w/Calamine, 4x10 (in/yd) Discharge Instruction: Apply Unnaboot as directed. Compression Stockings Add-Ons Electronic  Signature(s) Signed: 11/26/2022 4:38:17 PM By: Zenaida Deed RN, BSN Previous Signature: 11/26/2022 2:40:52 PM Version By: Karl Ito Entered By: Zenaida Deed on 11/26/2022 14:59:13 -------------------------------------------------------------------------------- Wound Assessment Details Patient Name: Date of Service: [name], Sherolyn 11/26/2022 2:45 PM Medical Record Number: [id number] Patient Account Number: [id number] Date of Birth/Sex: Treating RN: 1931-10-12 (89 y.o. F) Primary Care Alexis Mizuno: Asencion Partridge Other Clinician: Referring Shylah Dossantos: Treating Stephfon Bovey/Extender: Luellen Pucker in Treatment: 3 Frakes, Kathie Rhodes ([id number]) [id number] Page 9 of 10 Wound Status Wound Number: 4 Primary Etiology: Lymphedema Wound Location: Right, Medial Lower Leg Wound Status: Open Wounding Event: Gradually Appeared Comorbid History: Asthma, Hypertension, Vasculitis Date Acquired: 11/19/2022 Weeks Of Treatment: 0 Clustered Wound: No Photos Wound Measurements Length: (cm) Width: (cm) Depth: (cm) Area: (cm) Volume: (cm) 0.3 % Reduction in Area: 91% 0.3 % Reduction in Volume: 91.1% 0.1 Epithelialization: Medium (34-66%) 0.071 Tunneling: No 0.007 Undermining: No Wound Description Classification: Partial Thickness Wound Margin: Flat and Intact Exudate Amount: Small Exudate Type: Serous Exudate Color: amber Foul Odor After Cleansing: No Slough/Fibrino No Wound Bed Granulation Amount: Large (67-100%)  Exposed Structure Granulation Quality: Pink Fascia Exposed: No Necrotic Amount: None Present (0%) Fat Layer (Subcutaneous Tissue) Exposed: Yes Tendon Exposed: No Muscle Exposed: No Joint Exposed: No Bone Exposed: No Periwound Skin Texture Texture Color No Abnormalities Noted: Yes No Abnormalities Noted: Yes Moisture Temperature / Pain No Abnormalities Noted: Yes Temperature: No Abnormality Tenderness on Palpation: Yes Treatment  Notes Wound #4 (Lower Leg) Wound Laterality: Right, Medial Cleanser Peri-Wound Care Triamcinolone 15 (g) Discharge Instruction: Use triamcinolone 15 (g) as directed Sween Lotion (Moisturizing lotion) Discharge Instruction: Apply moisturizing lotion as directed Topical Primary Dressing Maxorb Extra Ag+ Alginate Dressing, 2x2 (in/in) Discharge Instruction: Apply to wound bed as instructed Secondary Dressing Woven Gauze Sponge, Non-Sterile 4x4 in Hassell, Rosiland ([id number]) [phone].pdf Page 10 of 10 Discharge Instruction: Apply over primary dressing as directed. Secured With Compression Wrap Unnaboot w/Calamine, 4x10 (in/yd) Discharge Instruction: Apply Unnaboot as directed. Compression Stockings Add-Ons Electronic Signature(s) Signed: 11/26/2022 4:38:17 PM By: Zenaida Deed RN, BSN Previous Signature: 11/26/2022 2:40:52 PM Version By: Karl Ito Entered By: Zenaida Deed on 11/26/2022 15:00:07 -------------------------------------------------------------------------------- Vitals Details Patient Name: Date of Service: [name], Karri 11/26/2022 2:45 PM Medical Record Number: [id number] Patient Account Number: [id number] Date of Birth/Sex: Treating RN: [birth year] (89 y.o. F) Primary Care Won Kreuzer: Asencion Partridge Other Clinician: Referring Avianna Moynahan: Treating Jerzie Bieri/Extender: Luellen Pucker in Treatment: 3 Vital Signs Time Taken: 02:33 Temperature (F): 97.9 Height (in): 60 Pulse (bpm): 79 Weight (lbs): 149 Respiratory Rate (breaths/min): 20 Body Mass Index (BMI): 29.1 Blood Pressure (mmHg): 136/76 Reference Range: 80 - 120 mg / dl Electronic Signature(s) Signed: 11/26/2022 4:38:17 PM By: Zenaida Deed RN, BSN Entered By: Zenaida Deed on 11/26/2022 14:54:02

## 2022-12-03 ENCOUNTER — Encounter (HOSPITAL_BASED_OUTPATIENT_CLINIC_OR_DEPARTMENT_OTHER): Payer: Medicare Other | Admitting: General Surgery

## 2022-12-03 DIAGNOSIS — I872 Venous insufficiency (chronic) (peripheral): Secondary | ICD-10-CM | POA: Diagnosis not present

## 2022-12-03 DIAGNOSIS — L97818 Non-pressure chronic ulcer of other part of right lower leg with other specified severity: Secondary | ICD-10-CM | POA: Diagnosis not present

## 2022-12-03 DIAGNOSIS — L97821 Non-pressure chronic ulcer of other part of left lower leg limited to breakdown of skin: Secondary | ICD-10-CM | POA: Diagnosis not present

## 2022-12-03 DIAGNOSIS — E039 Hypothyroidism, unspecified: Secondary | ICD-10-CM | POA: Diagnosis not present

## 2022-12-03 DIAGNOSIS — L97811 Non-pressure chronic ulcer of other part of right lower leg limited to breakdown of skin: Secondary | ICD-10-CM | POA: Diagnosis not present

## 2022-12-03 DIAGNOSIS — I890 Lymphedema, not elsewhere classified: Secondary | ICD-10-CM | POA: Diagnosis not present

## 2022-12-03 DIAGNOSIS — S80829A Blister (nonthermal), unspecified lower leg, initial encounter: Secondary | ICD-10-CM | POA: Diagnosis not present

## 2022-12-04 NOTE — Progress Notes (Addendum)
[name] ([id number]) [id number] Page 1 of 7 Visit Report for 12/03/2022 Chief Complaint Document Details Patient Name: Date of Service: [name], [name] 12/03/2022 7:45 A M Medical Record Number: [id number] Patient Account Number: [id number] Date of Birth/Sex: Treating RN: 02/28/1932 (89 y.o. F) Primary Care Provider: Asencion Partridge Other Clinician: Referring Provider: Treating Provider/Extender: Chelsea Aus in Treatment: 4 Information Obtained from: Patient Chief Complaint Patient presents for treatment of an open ulcer due to venous insufficiency and likely some degree of lymphedema Electronic Signature(s) Signed: 12/03/2022 8:09:40 AM By: Duanne Guess MD FACS Entered By: Duanne Guess on 12/03/2022 08:09:40 -------------------------------------------------------------------------------- HPI Details Patient Name: Date of Service: [name], [name] 12/03/2022 7:45 A M Medical Record Number: [id number] Patient Account Number: [id number] Date of Birth/Sex: Treating RN: 1932/04/03 (89 y.o. F) Primary Care Provider: Asencion Partridge Other Clinician: Referring Provider: Treating Provider/Extender: Chelsea Aus in Treatment: 4 History of Present Illness HPI Description: ADMISSION 11/04/2022 This is an 89 year old woman with a past medical history notable for congestive heart failure, complete heart block status post pacemaker placement, impaired glucose tolerance, hypertension, chronic venous insufficiency and bilateral lower extremity edema, CKD stage IIIa, and hypothyroidism. She has struggled with worsening lower extremity edema and recently had her diuretic change from furosemide to torsemide. She was treated in April for cellulitis with a 10-day course of cephalexin. She apparently had a similar issue in December of this past year which responded to [employer] boots and doxycycline. Compression stockings and leg  elevation were recommended. She was also referred to the wound center due to what was described in the electronic medical record and as thickened eschar and scabbed bullae. ABIs in clinic today was 0.99 on the right and 0.92 on the left. She does not wear compression stockings because she finds them constricting and uncomfortable. 11/19/2022: There is a tiny residual opening on the right leg, but everything else is healed. 11/22/2022: Because of the way the [facility] wraps were ordered (only one ordered despite bilateral wounds on initial intake), we ended up having to apply an Unna boot on the left leg and the Farrow wrap on the right. For some reason, the patient had a significant reaction to the stocking and experienced significant itching and developed blisters on her leg. They called to make an urgent appointment today to have this evaluated. I suspect she may have an allergy to what ever is on the stockings straight from the factory. 6/7; the patient to use has venous and lymphedema. She had wounds on both legs that are technically healed. She was ordered Farrow wraps for swelling control. She developed a fairly angry rash on the right leg in all likelihood contact dermatitis from the stocking layer of the Farrow wrap. We therefore wrapped the right leg. She returns today with this looking quite a bit better. Her son is purchased a stocking for the left leg. We are going to order some form of compression stocking for both legs use the new stocking as the inner sleeve for her Farrow wrap. 12/03/2022: The wounds are healed. A pair of juxta lite stockings was ordered and overnighted to her; they should arrive today. Electronic Signature(s) Signed: 12/03/2022 8:11:06 AM By: Duanne Guess MD FACS Entered By: Duanne Guess on 12/03/2022 08:11:06 Ritchey, Kathie [name] ([id number]) [id number] Page 2 of  7 -------------------------------------------------------------------------------- Physical Exam Details Patient Name: Date of Service: [name], [name] 12/03/2022 7:45 A M Medical Record Number: [id number] Patient Account Number: [id number] Date of Birth/Sex: Treating RN: [birth year] (  89 y.o. F) Primary Care Provider: Asencion Partridge Other Clinician: Referring Provider: Treating Provider/Extender: Chelsea Aus in Treatment: 4 Constitutional . . . . no acute distress. Respiratory Normal work of breathing on room air. Notes Her wounds are healed. Electronic Signature(s) Signed: 12/03/2022 8:11:41 AM By: Duanne Guess MD FACS Entered By: Duanne Guess on 12/03/2022 08:11:40 -------------------------------------------------------------------------------- Physician Orders Details Patient Name: Date of Service: [name], [name] 12/03/2022 7:45 A M Medical Record Number: [id number] Patient Account Number: [id number] Date of Birth/Sex: Treating RN: Mar 30, 1932 (89 y.o. [name] Primary Care Provider: Asencion Partridge Other Clinician: Referring Provider: Treating Provider/Extender: Chelsea Aus in Treatment: 4 Verbal / Phone Orders: No Diagnosis Coding ICD-10 Coding Code Description L97.811 Non-pressure chronic ulcer of other part of right lower leg limited to breakdown of skin N18.31 Chronic kidney disease, stage 3a L97.821 Non-pressure chronic ulcer of other part of left lower leg limited to breakdown of skin I87.2 Venous insufficiency (chronic) (peripheral) R60.0 Localized edema I50.32 Chronic diastolic (congestive) heart failure I10 Essential (primary) hypertension [facility]charge From [facility] Discharge from Wound Care Center Anesthetic (In clinic) Topical Lidocaine 4% applied to wound bed Edema Control - Lymphedema / SCD / Other Bilateral Lower Extremities Elevate legs to the level of the heart or above for 30 minutes  daily and/or when sitting for 3-4 times a day throughout the day. - whenever sitting Avoid standing for long periods of time. Exercise regularly Moisturize legs daily. Compression stocking or Garment 30-40 mm/Hg pressure to: - put the juxtalites on in the morning when you get up and take them off at night [facility], Sheletha ([id number]) [phone].pdf Page 3 of 7 before bed Patient Medications llergies: fluticasone, sulfa antibiotics, tetracycline, benzonatate, erythromycin base, meloxicam A Notifications Medication Indication Start End 12/03/2022 lidocaine DOSE topical 4 % cream - cream topical Electronic Signature(s) Signed: 12/03/2022 8:11:51 AM By: Duanne Guess MD FACS Entered By: Duanne Guess on 12/03/2022 08:11:50 -------------------------------------------------------------------------------- Problem List Details Patient Name: Date of Service: [name], Joscelyne 12/03/2022 7:45 A M Medical Record Number: [id number] Patient Account Number: [id number] Date of Birth/Sex: Treating RN: September 20, 1931 (89 y.o. F) Primary Care Provider: Asencion Partridge Other Clinician: Referring Provider: Treating Provider/Extender: Chelsea Aus in Treatment: 4 Active Problems ICD-10 Encounter Code Description Active Date MDM Diagnosis L97.811 Non-pressure chronic ulcer of other part of right lower leg limited to breakdown 11/04/2022 No Yes of skin N18.31 Chronic kidney disease, stage 3a 11/04/2022 No Yes L97.821 Non-pressure chronic ulcer of other part of left lower leg limited to breakdown 11/04/2022 No Yes of skin I87.2 Venous insufficiency (chronic) (peripheral) 11/04/2022 No Yes R60.0 Localized edema 11/04/2022 No Yes I50.32 Chronic diastolic (congestive) heart failure 11/04/2022 No Yes I10 Essential (primary) hypertension 11/04/2022 No Yes Inactive Problems Resolved Problems Electronic Signature(s) Signed: 12/03/2022 8:08:58 AM By: Duanne Guess MD  FACS [place],Signed: 12/03/2022 8:08:58 AM By: Duanne Guess MD FACS Amiera ([id number]) [id number] Page 4 of 7 Entered By: Duanne Guess on 12/03/2022 08:08:58 -------------------------------------------------------------------------------- Progress Note Details Patient Name: Date of Service: [name], [name] 12/03/2022 7:45 A M Medical Record Number: [id number] Patient Account Number: [id number] Date of Birth/Sex: Treating RN: 1932-03-06 (89 y.o. F) Primary Care Provider: Asencion Partridge Other Clinician: Referring Provider: Treating Provider/Extender: Chelsea Aus in Treatment: 4 Subjective Chief Complaint Information obtained from Patient Patient presents for treatment of an open ulcer due to venous insufficiency and likely some degree of lymphedema History of Present Illness (HPI) ADMISSION 11/04/2022 This is an 89 year old  woman with a past medical history notable for congestive heart failure, complete heart block status post pacemaker placement, impaired glucose tolerance, hypertension, chronic venous insufficiency and bilateral lower extremity edema, CKD stage IIIa, and hypothyroidism. She has struggled with worsening lower extremity edema and recently had her diuretic change from furosemide to torsemide. She was treated in April for cellulitis with a 10-day course of cephalexin. She apparently had a similar issue in December of this past year which responded to [employer] boots and doxycycline. Compression stockings and leg elevation were recommended. She was also referred to the wound center due to what was described in the electronic medical record and as thickened eschar and scabbed bullae. ABIs in clinic today was 0.99 on the right and 0.92 on the left. She does not wear compression stockings because she finds them constricting and uncomfortable. 11/19/2022: There is a tiny residual opening on the right leg, but everything else is  healed. 11/22/2022: Because of the way the [facility] wraps were ordered (only one ordered despite bilateral wounds on initial intake), we ended up having to apply an Unna boot on the left leg and the Farrow wrap on the right. For some reason, the patient had a significant reaction to the stocking and experienced significant itching and developed blisters on her leg. They called to make an urgent appointment today to have this evaluated. I suspect she may have an allergy to what ever is on the stockings straight from the factory. 6/7; the patient to use has venous and lymphedema. She had wounds on both legs that are technically healed. She was ordered Farrow wraps for swelling control. She developed a fairly angry rash on the right leg in all likelihood contact dermatitis from the stocking layer of the Farrow wrap. We therefore wrapped the right leg. She returns today with this looking quite a bit better. Her son is purchased a stocking for the left leg. We are going to order some form of compression stocking for both legs use the new stocking as the inner sleeve for her Farrow wrap. 12/03/2022: The wounds are healed. A pair of juxta lite stockings was ordered and overnighted to her; they should arrive today. Patient History Information obtained from Chart. Family History Cancer - Father, Stroke - Mother,Siblings. Social History Never smoker, Marital Status - Married, Alcohol Use - Never, Drug Use - No History, Caffeine Use - Never. Medical History Respiratory Patient has history of Asthma Cardiovascular Patient has history of Hypertension, Vasculitis - venous insufficiency Medical A Surgical History Notes nd Endocrine Hypothyroidism Genitourinary Stage 3 CKD Musculoskeletal Osteopenia Objective Delsol, Kimberla ([id number]) [id number] Page 5 of 7 Constitutional no acute distress. Vitals Time Taken: 7:46 AM, Height: 60 in, Weight: 149 lbs, BMI: 29.1, Temperature:  98.9 F, Pulse: 71 bpm, Respiratory Rate: 20 breaths/min, Blood Pressure: 130/52 mmHg. Respiratory Normal work of breathing on room air. General Notes: Her wounds are healed. Integumentary (Hair, Skin) Wound #1 status is Open. Original cause of wound was Blister. The date acquired was: 08/20/2022. The wound has been in treatment 4 weeks. The wound is located on the Right Lower Leg. The wound measures 0cm length x 0cm width x 0cm depth; 0cm^2 area and 0cm^3 volume. There is no tunneling or undermining noted. There is a none present amount of drainage noted. The wound margin is flat and intact. There is no granulation within the wound bed. There is no necrotic tissue within the wound bed. The periwound skin appearance had no abnormalities noted for texture. The periwound  skin appearance exhibited: Hemosiderin Staining. The periwound skin appearance did not exhibit: Dry/Scaly, Maceration, Rubor. Periwound temperature was noted as No Abnormality. Wound #3 status is Open. Original cause of wound was Gradually Appeared. The date acquired was: 11/19/2022. The wound has been in treatment 1 weeks. The wound is located on the Right,Anterior Lower Leg. The wound measures 0cm length x 0cm width x 0cm depth; 0cm^2 area and 0cm^3 volume. There is no tunneling or undermining noted. There is a none present amount of drainage noted. The wound margin is flat and intact. There is no granulation within the wound bed. There is no necrotic tissue within the wound bed. The periwound skin appearance had no abnormalities noted for texture. The periwound skin appearance had no abnormalities noted for moisture. The periwound skin appearance exhibited: Erythema. The surrounding wound skin color is noted with erythema which is circumferential. Periwound temperature was noted as No Abnormality. Wound #4 status is Open. Original cause of wound was Gradually Appeared. The date acquired was: 11/19/2022. The wound has been in treatment 1  weeks. The wound is located on the Right,Medial Lower Leg. The wound measures 0cm length x 0cm width x 0cm depth; 0cm^2 area and 0cm^3 volume. There is no tunneling or undermining noted. There is a none present amount of drainage noted. The wound margin is flat and intact. There is no granulation within the wound bed. There is no necrotic tissue within the wound bed. The periwound skin appearance had no abnormalities noted for texture. The periwound skin appearance had no abnormalities noted for moisture. The periwound skin appearance had no abnormalities noted for color. Periwound temperature was noted as No Abnormality. The periwound has tenderness on palpation. Assessment Active Problems ICD-10 Non-pressure chronic ulcer of other part of right lower leg limited to breakdown of skin Chronic kidney disease, stage 3a Non-pressure chronic ulcer of other part of left lower leg limited to breakdown of skin Venous insufficiency (chronic) (peripheral) Localized edema Chronic diastolic (congestive) heart failure Essential (primary) hypertension Plan Discharge From [facility] Services: Discharge from Wound Care Center Anesthetic: (In clinic) Topical Lidocaine 4% applied to wound bed Edema Control - Lymphedema / SCD / Other: Elevate legs to the level of the heart or above for 30 minutes daily and/or when sitting for 3-4 times a day throughout the day. - whenever sitting Avoid standing for long periods of time. Exercise regularly Moisturize legs daily. Compression stocking or Garment 30-40 mm/Hg pressure to: - put the juxtalites on in the morning when you get up and take them off at night before bed The following medication(s) was prescribed: lidocaine topical 4 % cream cream topical was prescribed at facility 12/03/2022: Her wounds are healed. A pair of juxta lite stockings has been ordered and overnighted to her home. They should arrive today. The importance of wearing these on a daily basis was  emphasized. She should also elevate her legs is much as possible throughout the day and at night while she is sleeping. We will discharge her from the wound care center. She may follow-up as needed. Electronic Signature(s) Signed: 12/03/2022 8:13:41 AM By: Duanne Guess MD FACS Entered By: Duanne Guess on 12/03/2022 08:13:41 Germani, Kathie [name] ([id number]) [id number] Page 6 of 7 -------------------------------------------------------------------------------- HxROS Details Patient Name: Date of Service: [name] RAINI, [name] 12/03/2022 7:45 A M Medical Record Number: [id number] Patient Account Number: [id number] Date of Birth/Sex: Treating RN: 1932/04/24 (89 y.o. F) Primary Care Provider: Asencion Partridge Other Clinician: Referring Provider: Treating Provider/Extender: Chelsea Aus in  Treatment: 4 Information Obtained From Chart Respiratory Medical History: Positive for: Asthma Cardiovascular Medical History: Positive for: Hypertension; Vasculitis - venous insufficiency Endocrine Medical History: Past Medical History Notes: Hypothyroidism Genitourinary Medical History: Past Medical History Notes: Stage 3 CKD Musculoskeletal Medical History: Past Medical History Notes: Osteopenia Immunizations Pneumococcal Vaccine: Received Pneumococcal Vaccination: No Implantable Devices Yes Family and Social History Cancer: Yes - Father; Stroke: Yes - Mother,Siblings; Never smoker; Marital Status - Married; Alcohol Use: Never; Drug Use: No History; Caffeine Use: Never; Financial Concerns: No; Food, Clothing or Shelter Needs: No; Support System Lacking: No; Transportation Concerns: No Electronic Signature(s) Signed: 12/03/2022 9:00:42 AM By: Duanne Guess MD FACS Entered By: Duanne Guess on 12/03/2022 08:11:13 -------------------------------------------------------------------------------- SuperBill Details Patient Name: Date of  Service: [name], Shaylyn 12/03/2022 Medical Record Number: [id number] Patient Account Number: [id number] Date of Birth/Sex: Treating RN: 1932/06/17 (89 y.o. F) Primary Care Provider: Asencion Partridge Other Clinician: [name] ([id number]) [id number] Page 7 of 7 Referring Provider: Treating Provider/Extender: Chelsea Aus in Treatment: 4 Diagnosis Coding ICD-10 Codes Code Description [phone] Non-pressure chronic ulcer of other part of right lower leg limited to breakdown of skin N18.31 Chronic kidney disease, stage 3a L97.821 Non-pressure chronic ulcer of other part of left lower leg limited to breakdown of skin I87.2 Venous insufficiency (chronic) (peripheral) R60.0 Localized edema I50.32 Chronic diastolic (congestive) heart failure I10 Essential (primary) hypertension Facility Procedures : CPT4 Code: [id number] Description: 99213 - WOUND CARE VISIT-LEV 3 EST PT Modifier: Quantity: 1 Physician Procedures : CPT4 Code Description Modifier [id number] - WC PHYS LEVEL 3 - EST PT ICD-10 Diagnosis Description L97.811 Non-pressure chronic ulcer of other part of right lower leg limited to breakdown of skin L97.821 Non-pressure chronic ulcer of other part of  left lower leg limited to breakdown of skin N18.31 Chronic kidney disease, stage 3a I87.2 Venous insufficiency (chronic) (peripheral) Quantity: 1 Electronic Signature(s) Signed: 12/20/2022 11:02:47 AM By: Pearletha Alfred Signed: 12/20/2022 1:40:35 PM By: Duanne Guess MD FACS Previous Signature: 12/03/2022 8:14:07 AM Version By: Duanne Guess MD FACS Entered By: Pearletha Alfred on 12/20/2022 11:02:47

## 2022-12-05 NOTE — Progress Notes (Signed)
Ryle, [name] ([id number]) [id number] Page 1 of 10 Visit Report for 12/03/2022 Arrival Information Details Patient Name: Date of Service: [name] [name], [name] 12/03/2022 7:45 A M Medical Record Number: [id number] Patient Account Number: [id number] Date of Birth/Sex: Treating RN: [birth year] (89 y.o. [name] Primary Care Monifa Blanchette: Asencion Partridge Other Clinician: Referring Violet Seabury: Treating Emilyn Ruble/Extender: Chelsea Aus in Treatment: 4 Visit Information History Since Last Visit Added or deleted any medications: No Patient Arrived: [name] Any new allergies or adverse reactions: No Arrival Time: 07:44 Had a fall or experienced change in No Accompanied By: son activities of daily living that may affect Transfer Assistance: None risk of falls: Patient Identification Verified: Yes Signs or symptoms of abuse/neglect since last visito No Secondary Verification Process Completed: Yes Hospitalized since last visit: No Patient Requires Transmission-Based Precautions: No Implantable device outside of the clinic excluding No Patient Has Alerts: No cellular tissue based products placed in the center since last visit: Has Dressing in Place as Prescribed: Yes Has Compression in Place as Prescribed: Yes Pain Present Now: No Electronic Signature(s) Signed: 12/03/2022 3:21:56 PM By: Samuella Bruin Entered By: Samuella Bruin on 12/03/2022 07:46:45 -------------------------------------------------------------------------------- Clinic Level of Care Assessment Details Patient Name: Date of Service: [name] [address], [place] 12/03/2022 7:45 A M Medical Record Number: [id number] Patient Account Number: [id number] Date of Birth/Sex: Treating RN: [birth year] (89 y.o. [name] Primary Care Sam Wunschel: Asencion Partridge Other Clinician: Referring Jisela Merlino: Treating Hazely Sealey/Extender: Chelsea Aus in Treatment: 4 Clinic  Level of Care Assessment Items TOOL 4 Quantity Score X- 1 0 Use when only an EandM is performed on FOLLOW-UP visit ASSESSMENTS - Nursing Assessment / Reassessment []  - 0 Reassessment of Co-morbidities (includes updates in patient status) X- 1 5 Reassessment of Adherence to Treatment Plan ASSESSMENTS - Wound and Skin A ssessment / Reassessment X - Simple Wound Assessment / Reassessment - one wound 1 5 []  - 0 Complex Wound Assessment / Reassessment - multiple wounds []  - 0 Dermatologic / Skin Assessment (not related to wound area) ASSESSMENTS - Focused Assessment X- 1 5 Circumferential Edema Measurements - multi extremities []  - 0 Nutritional Assessment / Counseling / Intervention [name], [name] ([id number]) [id number] Page 2 of 10 X- 1 5 Lower Extremity Assessment (monofilament, tuning fork, pulses) []  - 0 Peripheral Arterial Disease Assessment (using hand held doppler) ASSESSMENTS - Ostomy and/or Continence Assessment and Care []  - 0 Incontinence Assessment and Management []  - 0 Ostomy Care Assessment and Management (repouching, etc.) PROCESS - Coordination of Care X - Simple Patient / Family Education for ongoing care 1 15 []  - 0 Complex (extensive) Patient / Family Education for ongoing care X- 1 10 Staff obtains Chiropractor, Records, T Results / Process Orders est []  - 0 Staff telephones HHA, Nursing Homes / Clarify orders / etc []  - 0 Routine Transfer to another Facility (non-emergent condition) []  - 0 Routine Hospital Admission (non-emergent condition) []  - 0 New Admissions / Manufacturing engineer / Ordering NPWT Apligraf, etc. , []  - 0 Emergency Hospital Admission (emergent condition) X- 1 10 Simple Discharge Coordination []  - 0 Complex (extensive) Discharge Coordination PROCESS - Special Needs []  - 0 Pediatric / Minor Patient Management []  - 0 Isolation Patient Management []  - 0 Hearing / Language / Visual special needs []  -  0 Assessment of Community assistance (transportation, D/C planning, etc.) []  - 0 Additional assistance / Altered mentation []  - 0 Support Surface(s) Assessment (bed, cushion, seat, etc.) INTERVENTIONS - Wound Cleansing / Measurement X -  Simple Wound Cleansing - one wound 1 5 []  - 0 Complex Wound Cleansing - multiple wounds X- 1 5 Wound Imaging (photographs - any number of wounds) []  - 0 Wound Tracing (instead of photographs) []  - 0 Simple Wound Measurement - one wound []  - 0 Complex Wound Measurement - multiple wounds INTERVENTIONS - Wound Dressings X - Small Wound Dressing one or multiple wounds 1 10 []  - 0 Medium Wound Dressing one or multiple wounds []  - 0 Large Wound Dressing one or multiple wounds X- 1 5 Application of Medications - topical []  - 0 Application of Medications - injection INTERVENTIONS - Miscellaneous []  - 0 External ear exam []  - 0 Specimen Collection (cultures, biopsies, blood, body fluids, etc.) []  - 0 Specimen(s) / Culture(s) sent or taken to Lab for analysis []  - 0 Patient Transfer (multiple staff / Nurse, adult / Similar devices) []  - 0 Simple Staple / Suture removal (25 or less) []  - 0 Complex Staple / Suture removal (26 or more) []  - 0 Hypo / Hyperglycemic Management (close monitor of Blood Glucose) [name], [name] ([id number]) [id number] Page 3 of 10 []  - 0 Ankle / Brachial Index (ABI) - do not check if billed separately X- 1 5 Vital Signs Has the patient been seen at the hospital within the last three years: Yes Total Score: 85 Level Of Care: New/Established - Level 3 Electronic Signature(s) Signed: 12/03/2022 3:21:56 PM By: Samuella Bruin Entered By: Samuella Bruin on 12/03/2022 08:16:47 -------------------------------------------------------------------------------- Encounter Discharge Information Details Patient Name: Date of Service: [name], [name] 12/03/2022 7:45 A M Medical Record Number:  [id number] Patient Account Number: [id number] Date of Birth/Sex: Treating RN: [birth year] (89 y.o. [name] Primary Care Ellieanna Funderburg: Asencion Partridge Other Clinician: Referring Oryn Casanova: Treating Maelynn Moroney/Extender: Chelsea Aus in Treatment: 4 Encounter Discharge Information Items Discharge Condition: Stable Ambulatory Status: Walker Discharge Destination: Home Transportation: Private Auto Accompanied By: son Schedule Follow-up Appointment: No Clinical Summary of Care: Patient Declined Electronic Signature(s) Signed: 12/03/2022 3:21:56 PM By: Samuella Bruin Entered By: Samuella Bruin on 12/03/2022 08:17:14 -------------------------------------------------------------------------------- Lower Extremity Assessment Details Patient Name: Date of Service: [name], [name] 12/03/2022 7:45 A M Medical Record Number: [id number] Patient Account Number: [id number] Date of Birth/Sex: Treating RN: 12-06-1931 (88 y.o. [name] Primary Care Leonte Horrigan: Asencion Partridge Other Clinician: Referring Thurmond Hildebran: Treating Glennda Weatherholtz/Extender: Penny Pia Weeks in Treatment: 4 Edema Assessment Assessed: [Left: No] [Right: No] Edema: [Left: Yes] [Right: Yes] Calf Left: Right: Point of Measurement: From Medial Instep 35 cm 37.5 cm Ankle Left: Right: Point of Measurement: From Medial Instep 24 cm 24 cm Vascular Assessment Left: [[id number] Page 4 of 10Right:] Pulses: Dorsalis Pedis Palpable: [phone].pdf Page 4 of 10Yes] Electronic Signature(s) Signed: 12/03/2022 3:21:56 PM By: Samuella Bruin Entered By: Samuella Bruin on 12/03/2022 07:54:06 -------------------------------------------------------------------------------- Multi Wound Chart Details Patient Name: Date of Service: [name], [name] 12/03/2022 7:45 A M Medical Record Number: [id number] Patient Account Number:  [id number] Date of Birth/Sex: Treating RN: 07/24/1931 (89 y.o. F) Primary Care Kiandre Spagnolo: Asencion Partridge Other Clinician: Referring Khyree Carillo: Treating Margarete Horace/Extender: Chelsea Aus in Treatment: 4 Vital Signs Height(in): 60 Pulse(bpm): 71 Weight(lbs): 149 Blood Pressure(mmHg): 130/52 Body Mass Index(BMI): 29.1 Temperature(F): 98.9 Respiratory Rate(breaths/min): 20 [1:Photos:] Right Lower Leg Right, Anterior Lower Leg Right, Medial Lower Leg Wound Location: Blister Gradually Appeared Gradually Appeared Wounding Event: Venous Leg Ulcer Lymphedema Lymphedema Primary Etiology: Asthma, Hypertension, Vasculitis Asthma, Hypertension, Vasculitis Asthma, Hypertension, Vasculitis Comorbid History: 08/20/2022 11/19/2022 11/19/2022 Date Acquired: 4 1  1 Weeks of Treatment: Open Open Open Wound Status: No No No Wound Recurrence: 0x0x0 0x0x0 0x0x0 Measurements L x W x D (cm) 0 0 0 A (cm) : rea 0 0 0 Volume (cm) : 100.00% 100.00% 100.00% % Reduction in A rea: 100.00% 100.00% 100.00% % Reduction in Volume: Full Thickness Without Exposed Partial Thickness Partial Thickness Classification: Support Structures None Present None Present None Present Exudate Amount: Flat and Intact Flat and Intact Flat and Intact Wound Margin: None Present (0%) None Present (0%) None Present (0%) Granulation Amount: None Present (0%) None Present (0%) None Present (0%) Necrotic Amount: Fascia: No Fascia: No Fascia: No Exposed Structures: Fat Layer (Subcutaneous Tissue): No Fat Layer (Subcutaneous Tissue): No Fat Layer (Subcutaneous Tissue): No Tendon: No Tendon: No Tendon: No Muscle: No Muscle: No Muscle: No Joint: No Joint: No Joint: No Bone: No Bone: No Bone: No Large (67-100%) Large (67-100%) Large (67-100%) Epithelialization: No Abnormalities Noted No Abnormalities Noted No Abnormalities Noted Periwound Skin Texture: Maceration: No No Abnormalities Noted No  Abnormalities Noted Periwound Skin Moisture: Dry/Scaly: No Hemosiderin Staining: Yes Erythema: Yes Erythema: Yes Periwound Skin Color: Rubor: No N/A Circumferential N/A Erythema Location: N/A Decreased N/A Erythema Change: No Abnormality No Abnormality No Abnormality Temperature: Luhmann, Genessa ([id number]) [id number] Page 5 of 10 N/A N/A Yes Tenderness on Palpation: Treatment Notes Electronic Signature(s) Signed: 12/03/2022 8:09:32 AM By: Duanne Guess MD FACS Entered By: Duanne Guess on 12/03/2022 08:09:32 -------------------------------------------------------------------------------- Multi-Disciplinary Care Plan Details Patient Name: Date of Service: [name], Shayanne 12/03/2022 7:45 A M Medical Record Number: [id number] Patient Account Number: [id number] Date of Birth/Sex: Treating RN: [birth year] (89 y.o. [name] Primary Care Vy Badley: Asencion Partridge Other Clinician: Referring [place] Gustafson: Treating Quang Thorpe/Extender: Chelsea Aus in Treatment: 4 Multidisciplinary Care Plan reviewed with physician Active Inactive Electronic Signature(s) Signed: 12/03/2022 3:21:56 PM By: Samuella Bruin Entered By: Samuella Bruin on 12/03/2022 08:16:01 -------------------------------------------------------------------------------- Pain Assessment Details Patient Name: Date of Service: [name], Danyale 12/03/2022 7:45 A M Medical Record Number: [id number] Patient Account Number: [id number] Date of Birth/Sex: Treating RN: January 16, 1932 (89 y.o. [name] Primary Care Lamberto Dinapoli: Asencion Partridge Other Clinician: Referring [name]: Treating Daryan Cagley/Extender: Chelsea Aus in Treatment: 4 Active Problems Location of Pain Severity and Description of Pain Patient Has Paino No Site Locations Rate the pain. Current Pain Level: 0 Fung, Drema ([id number]) [id number]  Page 6 of 10 Pain Management and Medication Current Pain Management: Electronic Signature(s) Signed: 12/03/2022 3:21:56 PM By: Samuella Bruin Entered By: Samuella Bruin on 12/03/2022 07:49:50 -------------------------------------------------------------------------------- Patient/Caregiver Education Details Patient Name: Date of Service: [name], Azuree 6/14/2024andnbsp7:45 A M Medical Record Number: [id number] Patient Account Number: [id number] Date of Birth/Gender: Treating RN: 10-Mar-1932 (89 y.o. [name] Primary Care Physician: Asencion Partridge Other Clinician: Referring Physician: Treating Physician/Extender: Chelsea Aus in Treatment: 4 Education Assessment Education Provided To: Patient Education Topics Provided Safety: Methods: Explain/Verbal Responses: Reinforcements needed, State content correctly Electronic Signature(s) Signed: 12/03/2022 3:21:56 PM By: Samuella Bruin Entered By: Samuella Bruin on 12/03/2022 08:16:14 -------------------------------------------------------------------------------- Wound Assessment Details Patient Name: Date of Service: [name], [name] 12/03/2022 7:45 A M Medical Record Number: [id number] Patient Account Number: [id number] Date of Birth/Sex: Treating RN: 04-19-1932 (89 y.o. [name] Primary Care Ronald Vinsant: Asencion Partridge Other Clinician: Referring Alick Lecomte: Treating Reatha Sur/Extender: Penny Pia Weeks in Treatment: 4 Wound Status Wound Number: 1 Primary Etiology: Venous Leg Ulcer Wound Location: Right Lower Leg Wound Status: Open Wounding Event: Blister Comorbid History: Asthma,  Hypertension, Vasculitis Date Acquired: 08/20/2022 Weeks Of Treatment: 4 Clustered Wound: No Photos [name], [name] ([id number]) [id number] Page 7 of 10 Wound Measurements Length: (cm) Width: (cm) Depth: (cm) Area: (cm) Volume: (cm) 0 % Reduction in  Area: 100% 0 % Reduction in Volume: 100% 0 Epithelialization: Large (67-100%) 0 Tunneling: No 0 Undermining: No Wound Description Classification: Full Thickness Without Exposed Support Structures Wound Margin: Flat and Intact Exudate Amount: None Present Foul Odor After Cleansing: No Slough/Fibrino No Wound Bed Granulation Amount: None Present (0%) Exposed Structure Necrotic Amount: None Present (0%) Fascia Exposed: No Fat Layer (Subcutaneous Tissue) Exposed: No Tendon Exposed: No Muscle Exposed: No Joint Exposed: No Bone Exposed: No Periwound Skin Texture Texture Color No Abnormalities Noted: Yes No Abnormalities Noted: No Hemosiderin Staining: Yes Moisture Rubor: No No Abnormalities Noted: No Dry / Scaly: No Temperature / Pain Maceration: No Temperature: No Abnormality Electronic Signature(s) Signed: 12/03/2022 3:21:56 PM By: Samuella Bruin Entered By: Samuella Bruin on 12/03/2022 07:56:26 -------------------------------------------------------------------------------- Wound Assessment Details Patient Name: Date of Service: [name], [name] 12/03/2022 7:45 A M Medical Record Number: [id number] Patient Account Number: [id number] Date of Birth/Sex: Treating RN: [birth year] (89 y.o. [name] Primary Care Iasia Forcier: Asencion Partridge Other Clinician: Referring Camar Guyton: Treating Saniyya Gau/Extender: Penny Pia Weeks in Treatment: 4 Wound Status Wound Number: 3 Primary Etiology: Lymphedema Wound Location: Right, Anterior Lower Leg Wound Status: Open Wounding Event: Gradually Appeared Comorbid History: Asthma, Hypertension, Vasculitis Date Acquired: 11/19/2022 Weeks Of Treatment: 1 Clustered Wound: No Photos [name], [name] ([id number]) [id number] Page 8 of 10 Wound Measurements Length: (cm) Width: (cm) Depth: (cm) Area: (cm) Volume: (cm) 0 % Reduction in Area: 100% 0 % Reduction in Volume: 100% 0  Epithelialization: Large (67-100%) 0 Tunneling: No 0 Undermining: No Wound Description Classification: Partial Thickness Wound Margin: Flat and Intact Exudate Amount: None Present Foul Odor After Cleansing: No Slough/Fibrino No Wound Bed Granulation Amount: None Present (0%) Exposed Structure Necrotic Amount: None Present (0%) Fascia Exposed: No Fat Layer (Subcutaneous Tissue) Exposed: No Tendon Exposed: No Muscle Exposed: No Joint Exposed: No Bone Exposed: No Periwound Skin Texture Texture Color No Abnormalities Noted: Yes No Abnormalities Noted: No Erythema: Yes Moisture Erythema Location: Circumferential No Abnormalities Noted: Yes Erythema Change: Decreased Temperature / Pain Temperature: No Abnormality Electronic Signature(s) Signed: 12/03/2022 3:21:56 PM By: Samuella Bruin Entered By: Samuella Bruin on 12/03/2022 07:56:55 -------------------------------------------------------------------------------- Wound Assessment Details Patient Name: Date of Service: [name], [name] 12/03/2022 7:45 A M Medical Record Number: [id number] Patient Account Number: [id number] Date of Birth/Sex: Treating RN: 01-09-1932 (89 y.o. [name] Primary Care Marcia Lepera: Asencion Partridge Other Clinician: Referring Corey Laski: Treating Domingo Fuson/Extender: Penny Pia Weeks in Treatment: 4 Wound Status Wound Number: 4 Primary Etiology: Lymphedema Wound Location: Right, Medial Lower Leg Wound Status: Open Wounding Event: Gradually Appeared Comorbid History: Asthma, Hypertension, Vasculitis Date Acquired: 11/19/2022 Weeks Of Treatment: 1 Clustered Wound: No Photos [name], [name] ([id number]) [id number] Page 9 of 10 Wound Measurements Length: (cm) Width: (cm) Depth: (cm) Area: (cm) Volume: (cm) 0 % Reduction in Area: 100% 0 % Reduction in Volume: 100% 0 Epithelialization: Large (67-100%) 0 Tunneling: No 0 Undermining:  No Wound Description Classification: Partial Thickness Wound Margin: Flat and Intact Exudate Amount: None Present Foul Odor After Cleansing: No Slough/Fibrino No Wound Bed Granulation Amount: None Present (0%) Exposed Structure Necrotic Amount: None Present (0%) Fascia Exposed: No Fat Layer (Subcutaneous Tissue) Exposed: No Tendon Exposed: No Muscle Exposed: No Joint Exposed: No Bone Exposed: No Periwound Skin Texture Texture Color  No Abnormalities Noted: Yes No Abnormalities Noted: Yes Moisture Temperature / Pain No Abnormalities Noted: Yes Temperature: No Abnormality Tenderness on Palpation: Yes Electronic Signature(s) Signed: 12/03/2022 3:21:56 PM By: Samuella Bruin Entered By: Samuella Bruin on 12/03/2022 07:57:12 -------------------------------------------------------------------------------- Vitals Details Patient Name: Date of Service: [name], [name] 12/03/2022 7:45 A M Medical Record Number: [id number] Patient Account Number: [id number] Date of Birth/Sex: Treating RN: 1932-03-05 (89 y.o. [name] Primary Care Jaxton Casale: Asencion Partridge Other Clinician: Referring Seydou Hearns: Treating Dekota Shenk/Extender: Chelsea Aus in Treatment: 4 Vital Signs Time Taken: 07:46 Temperature (F): 98.9 Height (in): 60 Pulse (bpm): 71 Weight (lbs): 149 Respiratory Rate (breaths/min): 20 Body Mass Index (BMI): 29.1 Blood Pressure (mmHg): 130/52 Reference Range: 80 - 120 mg / dl Electronic Signature(s) Signed: 12/03/2022 3:21:56 PM By: Samuella Bruin Deboard,Signed: 12/03/2022 3:21:56 PM By: Candis Schatz ([id number]) [id number] Page 10 of 10 aylor Entered By: Samuella Bruin on 12/03/2022 07:49:43

## 2022-12-13 ENCOUNTER — Ambulatory Visit (INDEPENDENT_AMBULATORY_CARE_PROVIDER_SITE_OTHER): Payer: Medicare Other

## 2022-12-13 VITALS — Wt 150.0 lb

## 2022-12-13 DIAGNOSIS — Z0000 Encounter for general adult medical examination without abnormal findings: Secondary | ICD-10-CM

## 2022-12-13 NOTE — Progress Notes (Signed)
Subjective:   [name] is a 89 y.o. female who presents for Medicare Annual (Subsequent) preventive examination.  Visit Complete: Virtual  I connected with  [name] on 12/13/22 by a audio enabled telemedicine application and verified that I am speaking with the correct person using two identifiers.  Patient Location: Home  Provider Location: Office/Clinic  I discussed the limitations of evaluation and management by telemedicine. The patient expressed understanding and agreed to proceed.   Review of Systems     Cardiac Risk Factors include: advanced age (>36men, >7 women);dyslipidemia;hypertension     Objective:    Today's Vitals   12/13/22 1409  Weight: 150 lb (68 kg)   Body mass index is 30.3 kg/m.     12/13/2022    2:14 PM 10/27/2021    9:37 AM 07/17/2021   12:25 PM 07/14/2021   12:00 PM 08/26/2020    2:07 PM 02/09/2017   12:19 PM 01/03/2015    2:58 PM  Advanced Directives  Does Patient Have a Medical Advance Directive? No No No No Yes No No  Type of Advance Directive     Living will;Healthcare Power of Attorney    Would patient like information on creating a medical advance directive? No - Patient declined No - Patient declined No - Patient declined No - Patient declined  Yes (MAU/Ambulatory/Procedural Areas - Information given)     Current Medications (verified) Outpatient Encounter Medications as of 12/13/2022  Medication Sig   acetaminophen (TYLENOL) 500 MG tablet    Ascorbic Acid (VITAMIN C) 100 MG tablet 500 mg daily.   aspirin 81 MG chewable tablet Chew 81 mg by mouth at bedtime.   calcium carbonate (TUMS EX) 750 MG chewable tablet Chew 1 tablet by mouth daily as needed for heartburn.   cetirizine (ZYRTEC) 10 MG tablet    cholecalciferol (VITAMIN D3) 25 MCG (1000 UNIT) tablet Take 1 tablet (1,000 Units total) by mouth daily.   conjugated estrogens (PREMARIN) vaginal cream    cromolyn (OPTICROM) 4 % ophthalmic solution    diltiazem (CARDIZEM CD) 120  MG 24 hr capsule TAKE 1 CAPSULE (120 MG TOTAL) BY MOUTH AT BEDTIME.   docusate sodium (COLACE) 100 MG capsule Take 1 capsule (100 mg total) by mouth 2 (two) times daily.   hydrOXYzine (ATARAX) 10 MG tablet Take 1 tablet (10 mg total) by mouth 3 (three) times daily as needed for itching.   KLOR-CON M20 20 MEQ tablet TAKE 1 TABLET (20 MEQ TOTAL) BY MOUTH DAILY. TAKE AN EXTRA TABLET ONCE DAILY WHEN YOU TAKE THE EXTRA FUROSEMIDE   levothyroxine (SYNTHROID) 25 MCG tablet Take 2 tablets (50 mcg total) by mouth daily.   LORazepam (ATIVAN) 0.5 MG tablet TAKE 1 TABLET BY MOUTH EVERY DAY AS NEEDED FOR ANXIETY   methocarbamol (ROBAXIN) 500 MG tablet Take 1 tablet (500 mg total) by mouth every 8 (eight) hours as needed for muscle spasms.   metoprolol succinate (TOPROL-XL) 25 MG 24 hr tablet TAKE 1 TABLET BY MOUTH EVERYDAY AT BEDTIME   pantoprazole (PROTONIX) 20 MG tablet Take 1 tablet (20 mg total) by mouth daily.   pravastatin (PRAVACHOL) 40 MG tablet TAKE 1 TABLET BY MOUTH EVERY DAY   tiZANidine (ZANAFLEX) 2 MG tablet    torsemide (DEMADEX) 20 MG tablet Take 2 tablets (40 mg total) by mouth in the morning AND 1 tablet (20 mg total) every evening.   traZODone (DESYREL) 50 MG tablet Take 0.5-1 tablets (25-50 mg total) by mouth at bedtime as needed  for sleep.   triamcinolone cream (KENALOG) 0.1 % Apply 1 Application topically 2 (two) times daily. For 2 weeks, then as needed   valsartan (DIOVAN) 160 MG tablet Take 1 tablet (160 mg total) by mouth daily.   traMADol (ULTRAM) 50 MG tablet  (Patient not taking: Reported on 12/13/2022)   No facility-administered encounter medications on file as of 12/13/2022.    Allergies (verified) Fluticasone-salmeterol, Sulfa antibiotics, Sulfonamide derivatives, Tetracycline hcl, Benzonatate, Erythromycin, Meloxicam, and Sulfamethoxazole   History: Past Medical History:  Diagnosis Date   Allergic rhinitis    Anginal pain (HCC)    Asthmatic bronchitis    Chronic cough     Dysrhythmia    PAF   Esophageal reflux    GERD (gastroesophageal reflux disease)    History of stress test 11/20/2009   This was essentially normal,post-stress EF was hyperdynamic at 73 beats per mins, There was no scar or ischemia.   Hx of echocardiogram 11/05/2010   EF>55% showed a normal systolic function with impaired diastolic dysfuntion and also tissue doppler suggesting increased elevated left atrial pressure, she had moderate LA dilatation and mild LA dilatation. There was calcified mitral apparatus with moderate MR, mild to moderate TR and trace aortic insufficiency.   Hyperlipemia    Hypertension    Hypothyroidism    PAF (paroxysmal atrial fibrillation) (HCC)    Pneumonia    Past Surgical History:  Procedure Laterality Date   CHOLECYSTECTOMY     CYSTOSCOPY WITH BIOPSY N/A 08/29/2020   Procedure: CYSTOSCOPY WITH BIOPSY WITH FULGURATION;  Surgeon: Bjorn Pippin, MD;  Location: [facility];  Service: Urology;  Laterality: N/A;   FOOT SURGERY     right   NASAL SEPTUM SURGERY     PACEMAKER INSERTION  June 1st 2009   St Jude [place] device, model #1610, serial #[id number], The artial lead is a Lexicographer, the ventricular lead is Runner, broadcasting/film/video.   PPM GENERATOR CHANGEOUT N/A 02/09/2017   Procedure: PPM GENERATOR CHANGEOUT;  Surgeon: Thurmon Fair, MD;  Location: [facility];  Service: Cardiovascular;  Laterality: N/A;   THYROID SURGERY     TONSILLECTOMY     TONSILLECTOMY     VESICOVAGINAL FISTULA CLOSURE W/ TAH     Family History  Problem Relation Age of Onset   Stroke Mother    Cancer Father        bladder   Stroke Sister    Social History   Socioeconomic History   Marital status: Widowed    Spouse name: Not on file   Number of children: Not on file   Years of education: Not on file   Highest education level: Not on file  Occupational History    Employer: RETIRED    Comment: Retired Heritage manager at [employer]  Tobacco Use   Smoking status: Never    Smokeless tobacco: Never  Vaping Use   Vaping Use: Never used  Substance and Sexual Activity   Alcohol use: No   Drug use: No   Sexual activity: Not on file  Other Topics Concern   Not on file  Social History Narrative   Not on file   Social Determinants of Health   Financial Resource Strain: Low Risk  (12/13/2022)   Overall Financial Resource Strain (CARDIA)    Difficulty of Paying Living Expenses: Not hard at all  Food Insecurity: No Food Insecurity (12/13/2022)   Hunger Vital Sign    Worried About Running Out of Food in the Last Year:  Never true    Ran Out of Food in the Last Year: Never true  Transportation Needs: No Transportation Needs (12/13/2022)   PRAPARE - Administrator, Civil Service (Medical): No    Lack of Transportation (Non-Medical): No  Physical Activity: Insufficiently Active (12/13/2022)   Exercise Vital Sign    Days of Exercise per Week: 5 days    Minutes of Exercise per Session: 10 min  Stress: No Stress Concern Present (12/13/2022)   [employer] of Occupational Health - Occupational Stress Questionnaire    Feeling of Stress : Not at all  Social Connections: Moderately Isolated (12/13/2022)   Social Connection and Isolation Panel [NHANES]    Frequency of Communication with Friends and Family: More than three times a week    Frequency of Social Gatherings with Friends and Family: More than three times a week    Attends Religious Services: More than 4 times per year    Active Member of [employer] or Organizations: No    Attends Banker Meetings: Never    Marital Status: Widowed    Tobacco Counseling Counseling given: Not Answered   Clinical Intake:  Pre-visit preparation completed: Yes  Pain : No/denies pain     BMI - recorded: 30.3 Nutritional Status: BMI > 30  Obese Nutritional Risks: None Diabetes: No  How often do you need to have someone help you when you read instructions, pamphlets, or other written materials from  your doctor or pharmacy?: 1 - Never  Interpreter Needed?: No  Information entered by :: Lanier Ensign, LPN   Activities of Daily Living    12/13/2022    2:15 PM  In your present state of health, do you have any difficulty performing the following activities:  Hearing? 0  Vision? 0  Difficulty concentrating or making decisions? 0  Walking or climbing stairs? 1  Comment use rail and son assist  Dressing or bathing? 0  Doing errands, shopping? 0  Preparing Food and eating ? Y  Comment with son  Using the Toilet? N  In the past six months, have you accidently leaked urine? N  Do you have problems with loss of bowel control? N  Managing your Medications? N  Managing your Finances? N  Housekeeping or managing your Housekeeping? N    Patient Care Team: Willow Ora, MD as PCP - General (Family Medicine) Lennette Bihari, MD as PCP - Cardiology (Cardiology) Croitoru, Rachelle Hora, MD as Consulting Physician (Cardiology)  Indicate any recent Medical Services you may have received from other than [facility] providers in the past year (date may be approximate).     Assessment:   This is a routine wellness examination for Kariel.  Hearing/Vision screen Hearing Screening - Comments:: Pt denies any hearing issues  Vision Screening - Comments:: Pt follows up with Dr Dione Booze for annual eye exams   Dietary issues and exercise activities discussed:     Goals Addressed             This Visit's Progress    Patient Stated       Be able to walk without walker        Depression Screen    12/13/2022    2:12 PM 10/27/2022    2:35 PM 10/20/2022    1:38 PM 06/28/2022    3:54 PM 05/31/2022    3:26 PM 04/30/2022   11:40 AM 10/27/2021    9:36 AM  PHQ 2/9 Scores  PHQ - 2 Score 0 0  0 0 0 0 0    Fall Risk    12/13/2022    2:15 PM 10/27/2022    2:35 PM 10/20/2022    1:38 PM 06/28/2022    3:54 PM 05/31/2022    3:26 PM  Fall Risk   Falls in the past year? 0 0 0 0 0  Number falls in past yr: 0 0 0 0  0  Injury with Fall? 0 0 0 0 0  Risk for fall due to : Impaired vision;Impaired mobility No Fall Risks No Fall Risks No Fall Risks No Fall Risks  Follow up Falls prevention discussed Falls evaluation completed Falls evaluation completed Falls evaluation completed Falls evaluation completed    MEDICARE RISK AT HOME:  Medicare Risk at Home - 12/13/22 1413     Any stairs in or around the home? Yes    If so, are there any without handrails? No    Home free of loose throw rugs in walkways, pet beds, electrical cords, etc? Yes    Adequate lighting in your home to reduce risk of falls? Yes    Life alert? No    Use of a cane, walker or w/c? No    Grab bars in the bathroom? No    Shower chair or bench in shower? Yes    Elevated toilet seat or a handicapped toilet? No             TIMED UP AND GO:  Was the test performed?  No    Cognitive Function:        12/13/2022    2:16 PM 10/27/2021    9:40 AM  6CIT Screen  What Year? 0 points 0 points  What month? 0 points 3 points  What time? 0 points 0 points  Count back from 20 0 points 0 points  Months in reverse 0 points 0 points  Repeat phrase 0 points 4 points  Total Score 0 points 7 points    Immunizations Immunization History  Administered Date(s) Administered   Fluad Quad(high Dose 65+) 02/27/2019, 03/03/2022   Influenza Split 02/19/2009, 02/17/2011, 03/09/2012, 04/03/2012, 03/21/2013   Influenza Whole 02/19/2009, 02/17/2011   Influenza, High Dose Seasonal PF 03/08/2016, 03/09/2017, 03/23/2018, 02/27/2019   Influenza,inj,Quad PF,6+ Mos 03/11/2014, 03/13/2015   Influenza,inj,quad, With Preservative 03/05/2020   Influenza-Unspecified 04/03/2012, 03/21/2013, 03/11/2014, 03/13/2015, 03/03/2016, 03/05/2020   Moderna Sars-Covid-2 Vaccination 10/12/2019, 11/03/2019   PFIZER(Purple Top)SARS-COV-2 Vaccination 10/13/2019, 11/03/2019, 05/10/2020   Pneumococcal Conjugate-13 03/21/2014, 03/14/2018   Pneumococcal Polysaccharide-23  06/21/1993, 04/22/1999, 04/21/2004, 06/22/2007   Pneumococcal-Unspecified 06/22/2007, 03/14/2018   Zoster Recombinat (Shingrix) 08/23/2017, 11/03/2017   Zoster, Live 07/22/2006   Zoster, Unspecified 08/23/2017, 11/03/2017      Flu Vaccine status: Up to date  Pneumococcal vaccine status: Up to date  Covid-19 vaccine status: Completed vaccines  Qualifies for Shingles Vaccine? Yes   Zostavax completed Yes   Shingrix Completed?: Yes  Screening Tests Health Maintenance  Topic Date Due   COVID-19 Vaccine (6 - 2023-24 season) 02/19/2022   INFLUENZA VACCINE  01/20/2023   Medicare Annual Wellness (AWV)  12/13/2023   Pneumonia Vaccine 82+ Years old  Completed   DEXA SCAN  Completed   Zoster Vaccines- Shingrix  Completed   HPV VACCINES  Aged Out   DTaP/Tdap/Td  Discontinued    Health Maintenance  Health Maintenance Due  Topic Date Due   COVID-19 Vaccine (6 - 2023-24 season) 02/19/2022    Colorectal cancer screening: No longer required.   Mammogram status: No longer required  due to age.  Bone Density status: Completed 04/01/10. Results reflect: Bone density results: OSTEOPENIA. Repeat every 0 years.  Additional Screening:   Vision Screening: Recommended annual ophthalmology exams for early detection of glaucoma and other disorders of the eye. Is the patient up to date with their annual eye exam?  Yes  Who is the provider or what is the name of the office in which the patient attends annual eye exams? Dr Dione Booze  If pt is not established with a provider, would they like to be referred to a provider to establish care? No .   Dental Screening: Recommended annual dental exams for proper oral hygiene   Community Resource Referral / Chronic Care Management: CRR required this visit?  No   CCM required this visit?  No     Plan:     I have personally reviewed and noted the following in the patient's chart:   Medical and social history Use of alcohol, tobacco or illicit  drugs  Current medications and supplements including opioid prescriptions. Patient is not currently taking opioid prescriptions. Functional ability and status Nutritional status Physical activity Advanced directives List of other physicians Hospitalizations, surgeries, and ER visits in previous 12 months Vitals Screenings to include cognitive, depression, and falls Referrals and appointments  In addition, I have reviewed and discussed with patient certain preventive protocols, quality metrics, and best practice recommendations. A written personalized care plan for preventive services as well as general preventive health recommendations were provided to patient.     Marzella Schlein, LPN   1/61/0960   After Visit Summary: (Declined) Due to this being a telephonic visit, with patients personalized plan was offered to patient but patient Declined AVS at this time   Nurse Notes: none

## 2022-12-13 NOTE — Patient Instructions (Signed)
[name] , Thank you for taking time to come for your Medicare Wellness Visit. I appreciate your ongoing commitment to your health goals. Please review the following plan we discussed and let me know if I can assist you in the future.   These are the goals we discussed:  Goals      Patient Stated     None at this time      Patient Stated     Be able to walk without walker         This is a list of the screening recommended for you and due dates:  Health Maintenance  Topic Date Due   COVID-19 Vaccine (6 - 2023-24 season) 02/19/2022   Flu Shot  01/20/2023   Medicare Annual Wellness Visit  12/13/2023   Pneumonia Vaccine  Completed   DEXA scan (bone density measurement)  Completed   Zoster (Shingles) Vaccine  Completed   HPV Vaccine  Aged Out   DTaP/Tdap/Td vaccine  Discontinued    Advanced directives: Advance directive discussed with you today. Even though you declined this today please call our office should you change your mind and we can give you the proper paperwork for you to fill out.  Conditions/risks identified: get to walking without walker  Next appointment: Follow up in one year for your annual wellness visit    Preventive Care 65 Years and Older, Female Preventive care refers to lifestyle choices and visits with your health care provider that can promote health and wellness. What does preventive care include? A yearly physical exam. This is also called an annual well check. Dental exams once or twice a year. Routine eye exams. Ask your health care provider how often you should have your eyes checked. Personal lifestyle choices, including: Daily care of your teeth and gums. Regular physical activity. Eating a healthy diet. Avoiding tobacco and drug use. Limiting alcohol use. Practicing safe sex. Taking low-dose aspirin every day. Taking vitamin and mineral supplements as recommended by your health care provider. What happens during an annual well  check? The services and screenings done by your health care provider during your annual well check will depend on your age, overall health, lifestyle risk factors, and family history of disease. Counseling  Your health care provider may ask you questions about your: Alcohol use. Tobacco use. Drug use. Emotional well-being. Home and relationship well-being. Sexual activity. Eating habits. History of falls. Memory and ability to understand (cognition). Work and work Astronomer. Reproductive health. Screening  You may have the following tests or measurements: Height, weight, and BMI. Blood pressure. Lipid and cholesterol levels. These may be checked every 5 years, or more frequently if you are over 68 years old. Skin check. Lung cancer screening. You may have this screening every year starting at age 8 if you have a 30-pack-year history of smoking and currently smoke or have quit within the past 15 years. Fecal occult blood test (FOBT) of the stool. You may have this test every year starting at age 84. Flexible sigmoidoscopy or colonoscopy. You may have a sigmoidoscopy every 5 years or a colonoscopy every 10 years starting at age 65. Hepatitis C blood test. Hepatitis B blood test. Sexually transmitted disease (STD) testing. Diabetes screening. This is done by checking your blood sugar (glucose) after you have not eaten for a while (fasting). You may have this done every 1-3 years. Bone density scan. This is done to screen for osteoporosis. You may have this done starting at age 80.  Mammogram. This may be done every 1-2 years. Talk to your health care provider about how often you should have regular mammograms. Talk with your health care provider about your test results, treatment options, and if necessary, the need for more tests. Vaccines  Your health care provider may recommend certain vaccines, such as: Influenza vaccine. This is recommended every year. Tetanus, diphtheria, and  acellular pertussis (Tdap, Td) vaccine. You may need a Td booster every 10 years. Zoster vaccine. You may need this after age 54. Pneumococcal 13-valent conjugate (PCV13) vaccine. One dose is recommended after age 45. Pneumococcal polysaccharide (PPSV23) vaccine. One dose is recommended after age 23. Talk to your health care provider about which screenings and vaccines you need and how often you need them. This information is not intended to replace advice given to you by your health care provider. Make sure you discuss any questions you have with your health care provider. Document Released: 07/04/2015 Document Revised: 02/25/2016 Document Reviewed: 04/08/2015 Elsevier Interactive Patient Education  2017 [place] Prevention in the Home Falls can cause injuries. They can happen to people of all ages. There are many things you can do to make your home safe and to help prevent falls. What can I do on the outside of my home? Regularly fix the edges of walkways and driveways and fix any cracks. Remove anything that might make you trip as you walk through a door, such as a raised step or threshold. Trim any bushes or trees on the path to your home. Use bright outdoor lighting. Clear any walking paths of anything that might make someone trip, such as rocks or tools. Regularly check to see if handrails are loose or broken. Make sure that both sides of any steps have handrails. Any raised decks and porches should have guardrails on the edges. Have any leaves, snow, or ice cleared regularly. Use sand or salt on walking paths during winter. Clean up any spills in your garage right away. This includes oil or grease spills. What can I do in the bathroom? Use night lights. Install grab bars by the toilet and in the tub and shower. Do not use towel bars as grab bars. Use non-skid mats or decals in the tub or shower. If you need to sit down in the shower, use a plastic, non-slip stool. Keep  the floor dry. Clean up any water that spills on the floor as soon as it happens. Remove soap buildup in the tub or shower regularly. Attach bath mats securely with double-sided non-slip rug tape. Do not have throw rugs and other things on the floor that can make you trip. What can I do in the bedroom? Use night lights. Make sure that you have a light by your bed that is easy to reach. Do not use any sheets or blankets that are too big for your bed. They should not hang down onto the floor. Have a firm chair that has side arms. You can use this for support while you get dressed. Do not have throw rugs and other things on the floor that can make you trip. What can I do in the kitchen? Clean up any spills right away. Avoid walking on wet floors. Keep items that you use a lot in easy-to-reach places. If you need to reach something above you, use a strong step stool that has a grab bar. Keep electrical cords out of the way. Do not use floor polish or wax that makes floors slippery. If  you must use wax, use non-skid floor wax. Do not have throw rugs and other things on the floor that can make you trip. What can I do with my stairs? Do not leave any items on the stairs. Make sure that there are handrails on both sides of the stairs and use them. Fix handrails that are broken or loose. Make sure that handrails are as long as the stairways. Check any carpeting to make sure that it is firmly attached to the stairs. Fix any carpet that is loose or worn. Avoid having throw rugs at the top or bottom of the stairs. If you do have throw rugs, attach them to the floor with carpet tape. Make sure that you have a light switch at the top of the stairs and the bottom of the stairs. If you do not have them, ask someone to add them for you. What else can I do to help prevent falls? Wear shoes that: Do not have high heels. Have rubber bottoms. Are comfortable and fit you well. Are closed at the toe. Do not  wear sandals. If you use a stepladder: Make sure that it is fully opened. Do not climb a closed stepladder. Make sure that both sides of the stepladder are locked into place. Ask someone to hold it for you, if possible. Clearly mark and make sure that you can see: Any grab bars or handrails. First and last steps. Where the edge of each step is. Use tools that help you move around (mobility aids) if they are needed. These include: Canes. Walkers. Scooters. Crutches. Turn on the lights when you go into a dark area. Replace any light bulbs as soon as they burn out. Set up your furniture so you have a clear path. Avoid moving your furniture around. If any of your floors are uneven, fix them. If there are any pets around you, be aware of where they are. Review your medicines with your doctor. Some medicines can make you feel dizzy. This can increase your chance of falling. Ask your doctor what other things that you can do to help prevent falls. This information is not intended to replace advice given to you by your health care provider. Make sure you discuss any questions you have with your health care provider. Document Released: 04/03/2009 Document Revised: 11/13/2015 Document Reviewed: 07/12/2014 Elsevier Interactive Patient Education  2017 [employer].

## 2022-12-21 NOTE — Progress Notes (Signed)
[name], [name] ([id number]) [id number] Page 1 of 6 Visit Report for 11/26/2022 HPI Details Patient Name: Date of Service: [name], [name] 11/26/2022 2:45 PM Medical Record Number: [id number] Patient Account Number: [id number] Date of Birth/Sex: Treating RN: 20-Dec-1931 (89 y.o. F) Primary Care Provider: Asencion Partridge Other Clinician: Referring Provider: Treating Provider/Extender: Luellen Pucker in Treatment: 3 History of Present Illness HPI Description: ADMISSION 11/04/2022 This is an 89 year old woman with a past medical history notable for congestive heart failure, complete heart block status post pacemaker placement, impaired glucose tolerance, hypertension, chronic venous insufficiency and bilateral lower extremity edema, CKD stage IIIa, and hypothyroidism. She has struggled with worsening lower extremity edema and recently had her diuretic change from furosemide to torsemide. She was treated in April for cellulitis with a 10-day course of cephalexin. She apparently had a similar issue in December of this past year which responded to [employer] boots and doxycycline. Compression stockings and leg elevation were recommended. She was also referred to the wound center due to what was described in the electronic medical record and as thickened eschar and scabbed bullae. ABIs in clinic today was 0.99 on the right and 0.92 on the left. She does not wear compression stockings because she finds them constricting and uncomfortable. 11/19/2022: There is a tiny residual opening on the right leg, but everything else is healed. 11/22/2022: Because of the way the [facility] wraps were ordered (only one ordered despite bilateral wounds on initial intake), we ended up having to apply an Unna boot on the left leg and the Farrow wrap on the right. For some reason, the patient had a significant reaction to the stocking and experienced significant itching and developed blisters  on her leg. They called to make an urgent appointment today to have this evaluated. I suspect she may have an allergy to what ever is on the stockings straight from the factory. 6/7; the patient to use has venous and lymphedema. She had wounds on both legs that are technically healed. She was ordered Farrow wraps for swelling control. She developed a fairly angry rash on the right leg in all likelihood contact dermatitis from the stocking layer of the Farrow wrap. We therefore wrapped the right leg. She returns today with this looking quite a bit better. Her son is purchased a stocking for the left leg. We are going to order some form of compression stocking for both legs use the new stocking as the inner sleeve for her Farrow wrap. Electronic Signature(s) Signed: 11/26/2022 4:34:22 PM By: Baltazar Najjar MD Entered By: Baltazar Najjar on 11/26/2022 15:57:14 -------------------------------------------------------------------------------- Physical Exam Details Patient Name: Date of Service: [name], [name] 11/26/2022 2:45 PM Medical Record Number: [id number] Patient Account Number: [id number] Date of Birth/Sex: Treating RN: 02-17-1932 (89 y.o. F) Primary Care Provider: Asencion Partridge Other Clinician: Referring Provider: Treating Provider/Extender: Luellen Pucker in Treatment: 3 Notes her right leg edema and dermatitis much better. no blisters are seen. no sights of infection Electronic Signature(s) Signed: 11/26/2022 4:34:22 PM By: Baltazar Najjar MD Entered By: Baltazar Najjar on 11/26/2022 15:58:26 [name], [name] ([id number]) [id number] Page 2 of 6 -------------------------------------------------------------------------------- Physician Orders Details Patient Name: Date of Service: [name] ABBIE, [name] 11/26/2022 2:45 PM Medical Record Number: [id number] Patient Account Number: [id number] Date of Birth/Sex: Treating RN: 10-Dec-1931 (89 y.o. [name] Primary Care Provider: Asencion Partridge Other Clinician: Referring Provider: Treating Provider/Extender: Luellen Pucker in Treatment: 3 Verbal / Phone Orders: No Diagnosis Coding ICD-10 Coding Code  Description L97.811 Non-pressure chronic ulcer of other part of right lower leg limited to breakdown of skin N18.31 Chronic kidney disease, stage 3a L97.821 Non-pressure chronic ulcer of other part of left lower leg limited to breakdown of skin I87.2 Venous insufficiency (chronic) (peripheral) R60.0 Localized edema I50.32 Chronic diastolic (congestive) heart failure I10 Essential (primary) hypertension Follow-up Appointments ppointment in 1 week. - Dr Lady Gary RM 2 Return A Friday 6/14 @ 07:45 am Anesthetic (In clinic) Topical Lidocaine 4% applied to wound bed Bathing/ Shower/ Hygiene May shower with protection but do not get wound dressing(s) wet. Protect dressing(s) with water repellant cover (for example, large plastic bag) or a cast cover and may then take shower. Edema Control - Lymphedema / SCD / Other Bilateral Lower Extremities Elevate legs to the level of the heart or above for 30 minutes daily and/or when sitting for 3-4 times a day throughout the day. - whenever sitting Avoid standing for long periods of time. Exercise regularly Moisturize legs daily. Compression stocking or Garment 30-40 mm/Hg pressure to: - Farrow wraps to left leg daily Wound Treatment Wound #1 - Lower Leg Wound Laterality: Right Peri-Wound Care: Sween Lotion (Moisturizing lotion) 1 x Per Week/30 Days Discharge Instructions: Apply moisturizing lotion as directed Prim Dressing: Maxorb Extra Ag+ Alginate Dressing, 2x2 (in/in) 1 x Per Week/30 Days ary Discharge Instructions: Apply to wound bed as instructed Compression Wrap: Unnaboot w/Calamine, 4x10 (in/yd) 1 x Per Week/30 Days Discharge Instructions: Apply Unnaboot as directed. Compression Stockings: Circaid Juxta Lite  Compression Wrap (DME) Left Leg Compression Amount: 30-40 mmHG Right Leg Compression Amount: 30-40 mmHG Discharge Instructions: Apply Circaid Juxta Lite Compression Wrap daily as instructed. Apply first thing in the morning, remove at night before bed. Wound #3 - Lower Leg Wound Laterality: Right, Anterior Peri-Wound Care: Triamcinolone 15 (g) 1 x Per Week Discharge Instructions: Use triamcinolone 15 (g) as directed Peri-Wound Care: Sween Lotion (Moisturizing lotion) 1 x Per Week Discharge Instructions: Apply moisturizing lotion as directed Prim Dressing: Maxorb Extra Ag+ Alginate Dressing, 2x2 (in/in) 1 x Per Week ary Discharge Instructions: Apply to wound bed as instructed Younge, [name] ([id number]) [id number] Page 3 of 6 Secondary Dressing: Woven Gauze Sponge, Non-Sterile 4x4 in 1 x Per Week Discharge Instructions: Apply over primary dressing as directed. Compression Wrap: Unnaboot w/Calamine, 4x10 (in/yd) 1 x Per Week Discharge Instructions: Apply Unnaboot as directed. Wound #4 - Lower Leg Wound Laterality: Right, Medial Peri-Wound Care: Triamcinolone 15 (g) 1 x Per Week Discharge Instructions: Use triamcinolone 15 (g) as directed Peri-Wound Care: Sween Lotion (Moisturizing lotion) 1 x Per Week Discharge Instructions: Apply moisturizing lotion as directed Prim Dressing: Maxorb Extra Ag+ Alginate Dressing, 2x2 (in/in) 1 x Per Week ary Discharge Instructions: Apply to wound bed as instructed Secondary Dressing: Woven Gauze Sponge, Non-Sterile 4x4 in 1 x Per Week Discharge Instructions: Apply over primary dressing as directed. Compression Wrap: Unnaboot w/Calamine, 4x10 (in/yd) 1 x Per Week Discharge Instructions: Apply Unnaboot as directed. Electronic Signature(s) Signed: 11/30/2022 5:04:24 PM By: Zenaida Deed RN, BSN Signed: 12/21/2022 3:35:02 PM By: Baltazar Najjar MD Previous Signature: 11/26/2022 4:34:22 PM Version By: Baltazar Najjar MD Previous  Signature: 11/26/2022 4:38:17 PM Version By: Zenaida Deed RN, BSN Entered By: Zenaida Deed on 11/30/2022 15:23:07 -------------------------------------------------------------------------------- Problem List Details Patient Name: Date of Service: [name], Kelsy 11/26/2022 2:45 PM Medical Record Number: [id number] Patient Account Number: [id number] Date of Birth/Sex: Treating RN: 11/06/1931 (89 y.o. [name] Primary Care Provider: Asencion Partridge Other Clinician: Referring Provider: Treating Provider/Extender: Leanord Hawking  Baldwin Crown, Durward Mallard Weeks in Treatment: 3 Active Problems ICD-10 Encounter Code Description Active Date MDM Diagnosis L97.811 Non-pressure chronic ulcer of other part of right lower leg limited to breakdown 11/04/2022 No Yes of skin N18.31 Chronic kidney disease, stage 3a 11/04/2022 No Yes L97.821 Non-pressure chronic ulcer of other part of left lower leg limited to breakdown 11/04/2022 No Yes of skin I87.2 Venous insufficiency (chronic) (peripheral) 11/04/2022 No Yes R60.0 Localized edema 11/04/2022 No Yes I50.32 Chronic diastolic (congestive) heart failure 11/04/2022 No Yes Sweaney, [name] ([id number]) [id number] Page 4 of 6 I10 Essential (primary) hypertension 11/04/2022 No Yes Inactive Problems Resolved Problems Electronic Signature(s) Signed: 11/26/2022 4:34:22 PM By: Baltazar Najjar MD Entered By: Baltazar Najjar on 11/26/2022 15:55:44 -------------------------------------------------------------------------------- Progress Note Details Patient Name: Date of Service: [name], Shakeela 11/26/2022 2:45 PM Medical Record Number: [id number] Patient Account Number: [id number] Date of Birth/Sex: Treating RN: 1932-04-16 (89 y.o. F) Primary Care Provider: Asencion Partridge Other Clinician: Referring Provider: Treating Provider/Extender: Luellen Pucker in Treatment: 3 Subjective History of Present Illness  (HPI) ADMISSION 11/04/2022 This is an 89 year old woman with a past medical history notable for congestive heart failure, complete heart block status post pacemaker placement, impaired glucose tolerance, hypertension, chronic venous insufficiency and bilateral lower extremity edema, CKD stage IIIa, and hypothyroidism. She has struggled with worsening lower extremity edema and recently had her diuretic change from furosemide to torsemide. She was treated in April for cellulitis with a 10-day course of cephalexin. She apparently had a similar issue in December of this past year which responded to [employer] boots and doxycycline. Compression stockings and leg elevation were recommended. She was also referred to the wound center due to what was described in the electronic medical record and as thickened eschar and scabbed bullae. ABIs in clinic today was 0.99 on the right and 0.92 on the left. She does not wear compression stockings because she finds them constricting and uncomfortable. 11/19/2022: There is a tiny residual opening on the right leg, but everything else is healed. 11/22/2022: Because of the way the [facility] wraps were ordered (only one ordered despite bilateral wounds on initial intake), we ended up having to apply an Unna boot on the left leg and the Farrow wrap on the right. For some reason, the patient had a significant reaction to the stocking and experienced significant itching and developed blisters on her leg. They called to make an urgent appointment today to have this evaluated. I suspect she may have an allergy to what ever is on the stockings straight from the factory. 6/7; the patient to use has venous and lymphedema. She had wounds on both legs that are technically healed. She was ordered Farrow wraps for swelling control. She developed a fairly angry rash on the right leg in all likelihood contact dermatitis from the stocking layer of the Farrow wrap. We therefore wrapped the right  leg. She returns today with this looking quite a bit better. Her son is purchased a stocking for the left leg. We are going to order some form of compression stocking for both legs use the new stocking as the inner sleeve for her Farrow wrap. Objective Constitutional Vitals Time Taken: 2:33 AM, Height: 60 in, Weight: 149 lbs, BMI: 29.1, Temperature: 97.9 F, Pulse: 79 bpm, Respiratory Rate: 20 breaths/min, Blood Pressure: 136/76 mmHg. Integumentary (Hair, Skin) Wound #1 status is Open. Original cause of wound was Blister. The date acquired was: 08/20/2022. The wound has been in treatment 3 weeks. The wound is located  on the Right Lower Leg. The wound measures 0.3cm length x 0.3cm width x 0.1cm depth; 0.071cm^2 area and 0.007cm^3 volume. There is Fat Layer (Subcutaneous Tissue) exposed. There is no tunneling or undermining noted. There is a small amount of serous drainage noted. The wound margin is flat and intact. There is large (67-100%) red granulation within the wound bed. There is no necrotic tissue within the wound bed. The periwound skin appearance had no abnormalities noted for texture. The periwound skin appearance exhibited: Hemosiderin Staining. The periwound skin appearance did not exhibit: Dry/Scaly, Maceration, Rubor. Periwound temperature was noted as No Abnormality. Wound #3 status is Open. Original cause of wound was Gradually Appeared. The date acquired was: 11/19/2022. The wound is located on the Right,Anterior Lower Leg. The wound measures 0.1cm length x 0.1cm width x 0.1cm depth; 0.008cm^2 area and 0.001cm^3 volume. There is Fat Layer (Subcutaneous Tissue) DORISSA, LONZO ([id number]) [id number] Page 5 of 6 exposed. There is no tunneling or undermining noted. There is a small amount of serous drainage noted. The wound margin is flat and intact. There is no granulation within the wound bed. There is no necrotic tissue within the wound bed. The periwound skin  appearance had no abnormalities noted for texture. The periwound skin appearance had no abnormalities noted for moisture. The periwound skin appearance exhibited: Erythema. The surrounding wound skin color is noted with erythema which is circumferential. Periwound temperature was noted as No Abnormality. Wound #4 status is Open. Original cause of wound was Gradually Appeared. The date acquired was: 11/19/2022. The wound is located on the Right,Medial Lower Leg. The wound measures 0.3cm length x 0.3cm width x 0.1cm depth; 0.071cm^2 area and 0.007cm^3 volume. There is Fat Layer (Subcutaneous Tissue) exposed. There is no tunneling or undermining noted. There is a small amount of serous drainage noted. The wound margin is flat and intact. There is large (67- 100%) pink granulation within the wound bed. There is no necrotic tissue within the wound bed. The periwound skin appearance had no abnormalities noted for texture. The periwound skin appearance had no abnormalities noted for moisture. The periwound skin appearance had no abnormalities noted for color. Periwound temperature was noted as No Abnormality. The periwound has tenderness on palpation. Assessment Active Problems ICD-10 Non-pressure chronic ulcer of other part of right lower leg limited to breakdown of skin Chronic kidney disease, stage 3a Non-pressure chronic ulcer of other part of left lower leg limited to breakdown of skin Venous insufficiency (chronic) (peripheral) Localized edema Chronic diastolic (congestive) heart failure Essential (primary) hypertension Procedures Wound #1 Pre-procedure diagnosis of Wound #1 is a Venous Leg Ulcer located on the Right Lower Leg . There was a Radio broadcast assistant Compression Therapy Procedure by Zenaida Deed, RN. Post procedure Diagnosis Wound #1: Same as Pre-Procedure Plan Follow-up Appointments: Return Appointment in 1 week. - Dr Lady Gary RM 2 Friday 6/14 @ 07:45 am Anesthetic: (In clinic) Topical  Lidocaine 4% applied to wound bed Bathing/ Shower/ Hygiene: May shower with protection but do not get wound dressing(s) wet. Protect dressing(s) with water repellant cover (for example, large plastic bag) or a cast cover and may then take shower. Edema Control - Lymphedema / SCD / Other: Elevate legs to the level of the heart or above for 30 minutes daily and/or when sitting for 3-4 times a day throughout the day. - whenever sitting Avoid standing for long periods of time. Exercise regularly Moisturize legs daily. Compression stocking or Garment 30-40 mm/Hg pressure to: - Farrow wraps to left leg  daily WOUND #1: - Lower Leg Wound Laterality: Right Peri-Wound Care: Sween Lotion (Moisturizing lotion) 1 x Per Week/30 Days Discharge Instructions: Apply moisturizing lotion as directed Prim Dressing: Maxorb Extra Ag+ Alginate Dressing, 2x2 (in/in) 1 x Per Week/30 Days ary Discharge Instructions: Apply to wound bed as instructed Com pression Wrap: Unnaboot w/Calamine, 4x10 (in/yd) 1 x Per Week/30 Days Discharge Instructions: Apply Unnaboot as directed. Com pression Stockings: Circaid Juxta Lite Compression Wrap (DME) Compression Amount: 30-40 mmHg (left) Compression Amount: 30-40 mmHg (right) Discharge Instructions: Apply Circaid Juxta Lite Compression Wrap daily as instructed. Apply first thing in the morning, remove at night before bed. WOUND #3: - Lower Leg Wound Laterality: Right, Anterior Peri-Wound Care: Triamcinolone 15 (g) 1 x Per Week/ Discharge Instructions: Use triamcinolone 15 (g) as directed Peri-Wound Care: Sween Lotion (Moisturizing lotion) 1 x Per Week/ Discharge Instructions: Apply moisturizing lotion as directed Prim Dressing: Maxorb Extra Ag+ Alginate Dressing, 2x2 (in/in) 1 x Per Week/ ary Discharge Instructions: Apply to wound bed as instructed Secondary Dressing: Woven Gauze Sponge, Non-Sterile 4x4 in 1 x Per Week/ Discharge Instructions: Apply over primary dressing as  directed. Com pression Wrap: Unnaboot w/Calamine, 4x10 (in/yd) 1 x Per Week/ Discharge Instructions: Apply Unnaboot as directed. WOUND #4: - Lower Leg Wound Laterality: Right, Medial Peri-Wound Care: Triamcinolone 15 (g) 1 x Per Week/ Discharge Instructions: Use triamcinolone 15 (g) as directed Peri-Wound Care: Sween Lotion (Moisturizing lotion) 1 x Per Week/ Discharge Instructions: Apply moisturizing lotion as directed [name], [name] ([id number]) [id number] Page 6 of 6 Prim Dressing: Maxorb Extra Ag+ Alginate Dressing, 2x2 (in/in) 1 x Per Week/ ary Discharge Instructions: Apply to wound bed as instructed Secondary Dressing: Woven Gauze Sponge, Non-Sterile 4x4 in 1 x Per Week/ Discharge Instructions: Apply over primary dressing as directed. Com pression Wrap: Unnaboot w/Calamine, 4x10 (in/yd) 1 x Per Week/ Discharge Instructions: Apply Unnaboot as directed. 1. new stocking to the left leg 2TCA to the areas of remianing erythema and silve alginate to the right leg 3order additional external compression stocking Electronic Signature(s) Signed: 11/30/2022 5:04:24 PM By: Zenaida Deed RN, BSN Signed: 12/21/2022 3:35:02 PM By: Baltazar Najjar MD Previous Signature: 11/26/2022 4:34:22 PM Version By: Baltazar Najjar MD Entered By: Zenaida Deed on 11/30/2022 15:24:46 -------------------------------------------------------------------------------- SuperBill Details Patient Name: Date of Service: [name], [name] 11/26/2022 Medical Record Number: [id number] Patient Account Number: [id number] Date of Birth/Sex: Treating RN: 1931/07/18 (89 y.o. Billy Coast, Bonita Quin Primary Care Provider: Asencion Partridge Other Clinician: Referring Provider: Treating Provider/Extender: Luellen Pucker in Treatment: 3 Diagnosis Coding ICD-10 Codes Code Description [phone] Non-pressure chronic ulcer of other part of right lower leg limited to breakdown of skin N18.31  Chronic kidney disease, stage 3a L97.821 Non-pressure chronic ulcer of other part of left lower leg limited to breakdown of skin I87.2 Venous insufficiency (chronic) (peripheral) R60.0 Localized edema I50.32 Chronic diastolic (congestive) heart failure I10 Essential (primary) hypertension Facility Procedures : CPT4 Code: [id number] Description: (Facility Use Only) [phone] - APPLY Roland Rack BOOT RT Modifier: Quantity: 1 Physician Procedures : CPT4 Code Description Modifier [id number] - WC PHYS LEVEL 3 - EST PT ICD-10 Diagnosis Description L97.811 Non-pressure chronic ulcer of other part of right lower leg limited to breakdown of skin L97.821 Non-pressure chronic ulcer of other part of  left lower leg limited to breakdown of skin Quantity: 1 Electronic Signature(s) Signed: 11/26/2022 4:34:22 PM By: Baltazar Najjar MD Entered By: Baltazar Najjar on 11/26/2022 16:01:28

## 2023-01-03 ENCOUNTER — Encounter: Payer: Self-pay | Admitting: Family Medicine

## 2023-01-03 ENCOUNTER — Ambulatory Visit (INDEPENDENT_AMBULATORY_CARE_PROVIDER_SITE_OTHER): Payer: Medicare Other | Admitting: Family Medicine

## 2023-01-03 VITALS — BP 118/60 | HR 70 | Temp 98.5°F | Ht 59.0 in | Wt 145.8 lb

## 2023-01-03 DIAGNOSIS — F320 Major depressive disorder, single episode, mild: Secondary | ICD-10-CM

## 2023-01-03 DIAGNOSIS — R7301 Impaired fasting glucose: Secondary | ICD-10-CM

## 2023-01-03 DIAGNOSIS — I5032 Chronic diastolic (congestive) heart failure: Secondary | ICD-10-CM

## 2023-01-03 DIAGNOSIS — I442 Atrioventricular block, complete: Secondary | ICD-10-CM

## 2023-01-03 DIAGNOSIS — I10 Essential (primary) hypertension: Secondary | ICD-10-CM

## 2023-01-03 DIAGNOSIS — I872 Venous insufficiency (chronic) (peripheral): Secondary | ICD-10-CM

## 2023-01-03 MED ORDER — FLUOXETINE HCL 20 MG PO TABS: ORAL_TABLET | ORAL | 0 refills | Status: DC

## 2023-01-03 NOTE — Patient Instructions (Signed)
Please return in 6 months for your annual complete physical; please come fasting.   If you have any questions or concerns, please don't hesitate to send me a message via MyChart or call the office at [phone]. Thank you for visiting with us today! It's our pleasure caring for you.  

## 2023-01-03 NOTE — Progress Notes (Signed)
Subjective  CC:  Chief Complaint  Patient presents with   Hypertension    HPI: [name] is a 89 y.o. female who presents to the office today to address the problems listed above in the chief complaint. 6  mo f/u.  Reviewed recent notes.  Fortunately, her leg wounds have healed.  She is managing her lower extremity lymphedema well with torsemide 40 every morning and 20 nightly.  She is overdue to recheck her renal function which was mildly elevated after using 40 twice daily.  She is using compression socks and that has helped.  No wounds.  No redness.  No she has no shortness of breath or chest pain.  Blood pressure has been controlled and she has not felt lightheaded.  I reviewed medications in detail. Mood: Patient reports she just does not feel very well.  Not very motivated.  Poor appetite.  Does not feel like making things.  Unfortunately she is mostly edentulous and cannot chew well.  She feels tired most of the time.  She does admit to feeling down.  No history of depression.  She does have history of anxiety but that seems to be stable.  She lives with her son who helps take care of her.  He has noticed a change in her demeanor.  She has limited activity.  She is home most days of the week. History of impaired fasting glucose but not eating much currently.  No symptoms of hyperglycemia.  Assessment  1. Essential hypertension   2. Chronic diastolic heart failure (HCC)   3. CHB (complete heart block) (HCC) s/p pacemaker   4. Impaired fasting glucose   5. Venous insufficiency of both lower extremities   6. Depression, major, single episode, mild (HCC)      Plan   Hypertension f/u: BP control is well controlled.  No change in medications Chronic heart failure and leg edema: Continue torsemide.  Working well.  Recheck renal function and potassium Recheck A1c to monitor Depression with low motivation and depressed mood: Discussed medications.  Trial of Prozac.  Decreased appetite  and a dentulous: We discussed many food options that would work.  Education regarding management of these chronic disease states was given. Management strategies discussed on successive visits include dietary and exercise recommendations, goals of achieving and maintaining IBW, and lifestyle modifications aiming for adequate sleep and minimizing stressors.   Follow up: 6 months for complete physical  Orders Placed This Encounter  Procedures   Pro b natriuretic peptide   Basic metabolic panel   Hemoglobin A1c   Meds ordered this encounter  Medications   FLUoxetine (PROZAC) 20 MG tablet    Sig: Take 0.5 tablets (10 mg total) by mouth daily for 7 days, THEN 1 tablet (20 mg total) daily.    Dispense:  90 tablet    Refill:  0      BP Readings from Last 3 Encounters:  01/03/23 118/60  11/23/22 134/86  10/27/22 (!) 138/54   Wt Readings from Last 3 Encounters:  01/03/23 145 lb 12.8 oz (66.1 kg)  12/13/22 150 lb (68 kg)  11/23/22 150 lb 9.6 oz (68.3 kg)    Lab Results  Component Value Date   CHOL 134 04/30/2022   CHOL 144 04/27/2021   CHOL 136 03/13/2015   Lab Results  Component Value Date   HDL 54.80 04/30/2022   HDL 76.10 04/27/2021   HDL 57 03/13/2015   Lab Results  Component Value Date   LDLCALC 59  04/30/2022   LDLCALC 57 04/27/2021   LDLCALC 60 03/13/2015   Lab Results  Component Value Date   TRIG 100.0 04/30/2022   TRIG 55.0 04/27/2021   TRIG 95 03/13/2015   Lab Results  Component Value Date   CHOLHDL 2 04/30/2022   CHOLHDL 2 04/27/2021   CHOLHDL 2.4 03/13/2015   No results found for: "LDLDIRECT" Lab Results  Component Value Date   CREATININE 1.65 (H) 11/23/2022   BUN 42 (H) 11/23/2022   NA 142 11/23/2022   K 4.8 11/23/2022   CL 102 11/23/2022   CO2 26 11/23/2022    The ASCVD Risk score (Arnett DK, et al., 2019) failed to calculate for the following reasons:   The 2019 ASCVD risk score is only valid for ages 23 to 51  I reviewed the patients  updated PMH, FH, and SocHx.    Patient Active Problem List   Diagnosis Date Noted   Chronic diastolic heart failure (HCC) 03/03/2022    Priority: High   Stage 3a chronic kidney disease (CKD) (HCC) 04/27/2021    Priority: High   SSS (sick sinus syndrome) (HCC) 06/15/2017    Priority: High   PAT (paroxysmal atrial tachycardia) 06/15/2017    Priority: High   Impaired fasting glucose 07/27/2016    Priority: High   CHB (complete heart block) (HCC) s/p pacemaker 05/17/2013    Priority: High   Pacemaker 02/04/2013    Priority: High   Essential hypertension 02/04/2013    Priority: High   Mixed hyperlipidemia 02/04/2013    Priority: High   Osteopenia 04/27/2021    Priority: Medium    Adjustment disorder with anxiety 04/27/2021    Priority: Medium    Early stage nonexudative age-related macular degeneration of right eye 03/06/2020    Priority: Medium    Early stage nonexudative age-related macular degeneration of left eye 03/06/2020    Priority: Medium    Laryngopharyngeal reflux (LPR) 03/11/2017    Priority: Medium    Primary hypothyroidism 07/27/2016    Priority: Medium    Lower extremity edema 11/22/2014    Priority: Medium    Asthma 02/20/2010    Priority: Medium    GERD (gastroesophageal reflux disease) 10/26/2007    Priority: Medium    Chronic cough 10/26/2007    Priority: Medium    Choroidal nevus of right eye 03/06/2020    Priority: Low   Pseudophakia 03/06/2020    Priority: Low   Vitamin D deficiency 07/27/2016    Priority: Low   Allergic rhinitis due to pollen 08/21/2010    Priority: Low   Depression, major, single episode, mild (HCC) 01/03/2023   Venous insufficiency of both lower extremities 05/31/2022   Primary insomnia 10/28/2021   Normocytic anemia 07/14/2021    Allergies: Fluticasone-salmeterol, Sulfa antibiotics, Sulfonamide derivatives, Tetracycline hcl, Benzonatate, Erythromycin, Meloxicam, and Sulfamethoxazole  Social History: Patient  reports that  she has never smoked. She has never used smokeless tobacco. She reports that she does not drink alcohol and does not use drugs.  Current Meds  Medication Sig   acetaminophen (TYLENOL) 500 MG tablet    Ascorbic Acid (VITAMIN C) 100 MG tablet 500 mg daily.   aspirin 81 MG chewable tablet Chew 81 mg by mouth at bedtime.   calcium carbonate (TUMS EX) 750 MG chewable tablet Chew 1 tablet by mouth daily as needed for heartburn.   cetirizine (ZYRTEC) 10 MG tablet    cholecalciferol (VITAMIN D3) 25 MCG (1000 UNIT) tablet Take 1 tablet (1,000 Units total)  by mouth daily.   cromolyn (OPTICROM) 4 % ophthalmic solution    diltiazem (CARDIZEM CD) 120 MG 24 hr capsule TAKE 1 CAPSULE (120 MG TOTAL) BY MOUTH AT BEDTIME.   docusate sodium (COLACE) 100 MG capsule Take 1 capsule (100 mg total) by mouth 2 (two) times daily.   FLUoxetine (PROZAC) 20 MG tablet Take 0.5 tablets (10 mg total) by mouth daily for 7 days, THEN 1 tablet (20 mg total) daily.   hydrOXYzine (ATARAX) 10 MG tablet Take 1 tablet (10 mg total) by mouth 3 (three) times daily as needed for itching.   KLOR-CON M20 20 MEQ tablet TAKE 1 TABLET (20 MEQ TOTAL) BY MOUTH DAILY. TAKE AN EXTRA TABLET ONCE DAILY WHEN YOU TAKE THE EXTRA FUROSEMIDE   levothyroxine (SYNTHROID) 25 MCG tablet Take 2 tablets (50 mcg total) by mouth daily.   LORazepam (ATIVAN) 0.5 MG tablet TAKE 1 TABLET BY MOUTH EVERY DAY AS NEEDED FOR ANXIETY   metoprolol succinate (TOPROL-XL) 25 MG 24 hr tablet TAKE 1 TABLET BY MOUTH EVERYDAY AT BEDTIME   pantoprazole (PROTONIX) 20 MG tablet Take 1 tablet (20 mg total) by mouth daily.   pravastatin (PRAVACHOL) 40 MG tablet TAKE 1 TABLET BY MOUTH EVERY DAY   tiZANidine (ZANAFLEX) 2 MG tablet    torsemide (DEMADEX) 20 MG tablet Take 2 tablets (40 mg total) by mouth in the morning AND 1 tablet (20 mg total) every evening.   traZODone (DESYREL) 50 MG tablet Take 0.5-1 tablets (25-50 mg total) by mouth at bedtime as needed for sleep.    triamcinolone cream (KENALOG) 0.1 % Apply 1 Application topically 2 (two) times daily. For 2 weeks, then as needed   valsartan (DIOVAN) 160 MG tablet Take 1 tablet (160 mg total) by mouth daily.    Review of Systems: Cardiovascular: negative for chest pain, palpitations, leg swelling, orthopnea Respiratory: negative for SOB, wheezing or persistent cough Gastrointestinal: negative for abdominal pain Genitourinary: negative for dysuria or gross hematuria  Objective  Vitals: BP 118/60   Pulse 70   Temp 98.5 F (36.9 C)   Ht 4\' 11"  (1.499 m)   Wt 145 lb 12.8 oz (66.1 kg)   SpO2 95%   BMI 29.45 kg/m  General: no acute distress  Psych:  Alert and oriented, mildly depressed mood and affect, fairly good insight HEENT:  Normocephalic, atraumatic, supple neck  Cardiovascular:  RRR without murmur.  Wearing compression stockings Respiratory:  Good breath sounds bilaterally, CTAB with normal respiratory effort Lab Results  Component Value Date   NA 142 11/23/2022   CL 102 11/23/2022   K 4.8 11/23/2022   CO2 26 11/23/2022   BUN 42 (H) 11/23/2022   CREATININE 1.65 (H) 11/23/2022   EGFR 30 (L) 11/23/2022   CALCIUM 9.2 11/23/2022   ALBUMIN 4.1 10/25/2022   GLUCOSE 97 11/23/2022    Commons side effects, risks, benefits, and alternatives for medications and treatment plan prescribed today were discussed, and the patient expressed understanding of the given instructions. Patient is instructed to call or message via MyChart if he/she has any questions or concerns regarding our treatment plan. No barriers to understanding were identified. We discussed Red Flag symptoms and signs in detail. Patient expressed understanding regarding what to do in case of urgent or emergency type symptoms.  Medication list was reconciled, printed and provided to the patient in AVS. Patient instructions and summary information was reviewed with the patient as documented in the AVS. This note was prepared with  assistance of  [employer] engineer. Occasional wrong-word or sound-a-like substitutions may have occurred due to the inherent limitation

## 2023-01-04 ENCOUNTER — Ambulatory Visit (INDEPENDENT_AMBULATORY_CARE_PROVIDER_SITE_OTHER): Payer: Medicare Other | Admitting: Podiatry

## 2023-01-04 VITALS — BP 138/44

## 2023-01-04 DIAGNOSIS — M79674 Pain in right toe(s): Secondary | ICD-10-CM

## 2023-01-04 DIAGNOSIS — E785 Hyperlipidemia, unspecified: Secondary | ICD-10-CM | POA: Insufficient documentation

## 2023-01-04 DIAGNOSIS — M79675 Pain in left toe(s): Secondary | ICD-10-CM | POA: Diagnosis not present

## 2023-01-04 DIAGNOSIS — B351 Tinea unguium: Secondary | ICD-10-CM | POA: Diagnosis not present

## 2023-01-04 DIAGNOSIS — S91311A Laceration without foreign body, right foot, initial encounter: Secondary | ICD-10-CM

## 2023-01-04 LAB — BASIC METABOLIC PANEL
BUN: 68 mg/dL — ABNORMAL HIGH (ref 6–23)
CO2: 26 mEq/L (ref 19–32)
Calcium: 9.3 mg/dL (ref 8.4–10.5)
Chloride: 93 mEq/L — ABNORMAL LOW (ref 96–112)
Creatinine, Ser: 1.83 mg/dL — ABNORMAL HIGH (ref 0.40–1.20)
GFR: 24.24 mL/min — ABNORMAL LOW (ref >60.00)
Glucose, Bld: 101 mg/dL — ABNORMAL HIGH (ref 70–99)
Potassium: 3.6 mEq/L (ref 3.5–5.1)
Sodium: 134 mEq/L — ABNORMAL LOW (ref 135–145)

## 2023-01-04 LAB — HEMOGLOBIN A1C: Hgb A1c MFr Bld: 6.2 % (ref 4.6–6.5)

## 2023-01-04 NOTE — Progress Notes (Signed)
  Subjective:  Patient ID: [name], female    DOB: 18-Apr-1932,  MRN: [id number]  [name] presents to clinic today for: painful elongated mycotic toenails 1-5 bilaterally which are tender when wearing enclosed shoe gear. Pain is relieved with periodic professional debridement. Patient states she had a piece of dry skin on the bottom of her right foot and had her son remove it, after which it became tender. Denies any redness, drainage or swelling. Chief Complaint  Patient presents with   Nail Problem    rfc    PCP is Willow Ora, MD.  Allergies  Allergen Reactions   Fluticasone-Salmeterol Other (See Comments)    unknown Other reaction(s): tongue/throat symptoms   Sulfa Antibiotics     Other reaction(s): gastric intol   Sulfonamide Derivatives Other (See Comments)    unknown   Tetracycline Hcl    Benzonatate Other (See Comments)    tongue/throat symptoms in the past but tolerated more recently Other reaction(s): and tongue/throat symptoms in the past (not more recently)   Erythromycin Nausea And Vomiting and Other (See Comments)    Stomach cramps Other reaction(s): gastric intol   Meloxicam Other (See Comments)    Mild dyspnea Other reaction(s): dyspnea   Sulfamethoxazole Other (See Comments)    Gastric intolerance    Review of Systems: Negative except as noted in the HPI.  Objective: No changes noted in today's physical examination. Vitals:   01/04/23 1638  BP: (!) 138/44    [name] is a pleasant 89 y.o. female in NAD. AAO x 3.  Vascular Examination: Capillary refill time <3 seconds b/l LE. Palpable pedal pulses b/l LE. Digital hair decreased b/l. Skin temperature gradient WNL b/l. Trace edema noted BLE.Marland Kitchen  Dermatological Examination: Pedal skin with normal turgor, texture and tone b/l. No open wounds. No interdigital macerations b/l. Toenails 1-5 b/l thickened, discolored, dystrophic with subungual debris. There is pain on palpation to dorsal aspect  of nailplates.  Skin tear noted plantarcentral aspect right foot. Porokeratotic lesion(s) lateral midfoot RLE. No erythema, no edema, no drainage, no fluctuance..  Neurological Examination: Protective sensation intact with 10 gram monofilament b/l LE.   Musculoskeletal Examination: Normal muscle strength 5/5 to all lower extremity muscle groups bilaterally. No pain, crepitus or joint limitation noted with ROM b/l LE. HAV with bunion deformity noted b/l LE.Marland Kitchen Patient ambulates with walker assistance.     Latest Ref Rng & Units 01/03/2023    3:37 PM 04/30/2022   12:11 PM  Hemoglobin A1C  Hemoglobin-A1c 4.6 - 6.5 % 6.2  6.1    Assessment/Plan: 1. Pain due to onychomycosis of toenails of both feet   2. Tear of skin of plantar aspect of right foot, initial encounter     -Patient's family member present. All questions/concerns addressed on today's visit. -Skin flap gently debrided from skin tear. Area cleansed with alcohol. TAO and band-aid applied. Son instructed to apply Neosporin to area once daily for two weeks. Call office if there are any concerns. -Continue supportive shoe gear daily. -Toenails 1-5 b/l were debrided in length and girth with sterile nail nippers and dremel without iatrogenic bleeding.  -As a courtesy, porokeratotic lesion(s) right foot pared and enucleated without complication or incident. Total number pared=1. -Patient/POA to call should there be question/concern in the interim.   Return in about 3 months (around 04/06/2023).  Freddie Breech, DPM

## 2023-01-06 ENCOUNTER — Ambulatory Visit: Payer: Medicare Other | Attending: Cardiovascular Disease | Admitting: Cardiovascular Disease

## 2023-01-06 ENCOUNTER — Other Ambulatory Visit: Payer: Self-pay | Admitting: Cardiovascular Disease

## 2023-01-06 ENCOUNTER — Encounter: Payer: Self-pay | Admitting: Cardiovascular Disease

## 2023-01-06 VITALS — BP 112/48 | HR 84 | Ht 59.0 in | Wt 145.4 lb

## 2023-01-06 DIAGNOSIS — R7303 Prediabetes: Secondary | ICD-10-CM | POA: Diagnosis not present

## 2023-01-06 DIAGNOSIS — Z79899 Other long term (current) drug therapy: Secondary | ICD-10-CM | POA: Insufficient documentation

## 2023-01-06 DIAGNOSIS — I5032 Chronic diastolic (congestive) heart failure: Secondary | ICD-10-CM | POA: Diagnosis not present

## 2023-01-06 DIAGNOSIS — N1831 Chronic kidney disease, stage 3a: Secondary | ICD-10-CM | POA: Diagnosis not present

## 2023-01-06 DIAGNOSIS — N179 Acute kidney failure, unspecified: Secondary | ICD-10-CM | POA: Diagnosis not present

## 2023-01-06 DIAGNOSIS — I442 Atrioventricular block, complete: Secondary | ICD-10-CM | POA: Diagnosis not present

## 2023-01-06 DIAGNOSIS — Z8679 Personal history of other diseases of the circulatory system: Secondary | ICD-10-CM | POA: Diagnosis not present

## 2023-01-06 DIAGNOSIS — I495 Sick sinus syndrome: Secondary | ICD-10-CM | POA: Insufficient documentation

## 2023-01-06 DIAGNOSIS — I4719 Other supraventricular tachycardia: Secondary | ICD-10-CM | POA: Diagnosis not present

## 2023-01-06 DIAGNOSIS — E7800 Pure hypercholesterolemia, unspecified: Secondary | ICD-10-CM | POA: Insufficient documentation

## 2023-01-06 DIAGNOSIS — I10 Essential (primary) hypertension: Secondary | ICD-10-CM | POA: Insufficient documentation

## 2023-01-06 DIAGNOSIS — Z950 Presence of cardiac pacemaker: Secondary | ICD-10-CM | POA: Insufficient documentation

## 2023-01-06 LAB — PRO B NATRIURETIC PEPTIDE: NT-Pro BNP: 480 pg/mL (ref 0–738)

## 2023-01-06 MED ORDER — TORSEMIDE 40 MG PO TABS: 40.0000 mg | ORAL_TABLET | Freq: Every morning | ORAL | 3 refills | Status: DC

## 2023-01-06 NOTE — Progress Notes (Signed)
Patient ID: [name], female   DOB: [birth year], 89 y.o.   MRN: [id number]    Cardiology Office Note    Date:  01/06/2023   ID:  [name], DOB 02-Sep-1931, MRN [id number]  PCP:  Willow Ora, MD  Cardiologist: Nicki Guadalajara, M.D.;  Thurmon Fair, MD   Chief Complaint  Patient presents with   Pacemaker Check    History of Present Illness:  [name] is a 89 y.o. female who returns in follow-up for sinus node dysfunction, complete heart block, paroxysmal atrial tachycardia and pacemaker.    She is generally doing well since her last appointment from a cardiovascular point of view.  She has not had any falls or serious dizziness, but has noticed that her diastolic blood pressure is often very low as it is today at 48 mmHg.  She is quite sedentary.  She complains of sleep issues.  She has not had palpitations.  She denies lower extremity edema, orthopnea, PND.  She does not have chest pain at rest or with activity.  She uses a walker.  She has a variety of musculoskeletal aches and pains.  She continues to take twice daily torsemide, 40 mg in the morning and 20 mg in the evening.  She complains that her lips feel "like rubber".  She feels weak.  Diuretics were started last year when she had complaints of intermittent lower extremity edema.  She has borderline elevated NT proBNP in the past at 833, subsequently recheck normal at 515, currently even lower at 480.Marland Kitchen  Echocardiogram in June 2023 showed normal left ventricular systolic function and no significant valvular abnormalities.  Pacemaker check in the clinic today shows normal device function.  She has an estimated 3.3 years of remaining generator longevity.  She is pacemaker dependent due to complete heart block.  She has 52% atrial pacing and 100% ventricular pacing.  She has had 5 very brief episodes of paroxysmal atrial tachycardia, the longest being only 6 seconds in duration.  Back in March 2023 she had an episode of rapid atrial  rate that lasted for about 3 hours at 300 bpm, but there was no electrogram recorded.  Since then, she has not had atrial fibrillation or ventricular tachycardia.  I am not sure why, will be continued to have difficulty getting her remote downloads.  With a new transmitter at work once last year, we have not received any download since September.  Metabolic control remains good.  She has diet-controlled diabetes mellitus with a recent hemoglobin A1c of 6.2% and on statin therapy has an LDL of 59.  Her most recent creatinine was well above her previous baseline at 1.83 and appears to be steadily rising.    She was hospitalized on July 13, 2021 with bilateral lower extremity edema and hip pain and was found to have a bilateral superior pubic ramus fracture.    Her initial pacemaker was implanted for complete heart block in 2009.  She underwent pacemaker change out for ERI in August 2018 (St. Jude [employer] MRI, but leads are from 2009, not MRI conditional).  She has developed symptoms of sinus node dysfunction as well.    Past Medical History:  Diagnosis Date   Allergic rhinitis    Anginal pain (HCC)    Asthmatic bronchitis    Chronic cough    Dysrhythmia    PAF   Esophageal reflux    GERD (gastroesophageal reflux disease)    History of stress test 11/20/2009   This was  essentially normal,post-stress EF was hyperdynamic at 73 beats per mins, There was no scar or ischemia.   Hx of echocardiogram 11/05/2010   EF>55% showed a normal systolic function with impaired diastolic dysfuntion and also tissue doppler suggesting increased elevated left atrial pressure, she had moderate LA dilatation and mild LA dilatation. There was calcified mitral apparatus with moderate MR, mild to moderate TR and trace aortic insufficiency.   Hyperlipemia    Hypertension    Hypothyroidism    PAF (paroxysmal atrial fibrillation) (HCC)    Pneumonia     Past Surgical History:  Procedure Laterality Date    CHOLECYSTECTOMY     CYSTOSCOPY WITH BIOPSY N/A 08/29/2020   Procedure: CYSTOSCOPY WITH BIOPSY WITH FULGURATION;  Surgeon: Bjorn Pippin, MD;  Location: [facility];  Service: Urology;  Laterality: N/A;   FOOT SURGERY     right   NASAL SEPTUM SURGERY     PACEMAKER INSERTION  June 1st 2009   St Jude [place] device, model #2956, serial #[id number], The artial lead is a Lexicographer, the ventricular lead is Runner, broadcasting/film/video.   PPM GENERATOR CHANGEOUT N/A 02/09/2017   Procedure: PPM GENERATOR CHANGEOUT;  Surgeon: Thurmon Fair, MD;  Location: [facility];  Service: Cardiovascular;  Laterality: N/A;   THYROID SURGERY     TONSILLECTOMY     TONSILLECTOMY     VESICOVAGINAL FISTULA CLOSURE W/ TAH      Current Outpatient Medications  Medication Sig Dispense Refill   acetaminophen (TYLENOL) 500 MG tablet Take 500 mg by mouth as needed for mild pain.     Ascorbic Acid (VITAMIN C) 500 MG CHEW Chew 500 mg by mouth daily.     aspirin 81 MG chewable tablet Chew 81 mg by mouth at bedtime.     cholecalciferol (VITAMIN D3) 25 MCG (1000 UNIT) tablet Take 1 tablet (1,000 Units total) by mouth daily. 30 tablet 0   diltiazem (CARDIZEM CD) 120 MG 24 hr capsule TAKE 1 CAPSULE (120 MG TOTAL) BY MOUTH AT BEDTIME. 90 capsule 3   KLOR-CON M20 20 MEQ tablet TAKE 1 TABLET (20 MEQ TOTAL) BY MOUTH DAILY. TAKE AN EXTRA TABLET ONCE DAILY WHEN YOU TAKE THE EXTRA FUROSEMIDE 135 tablet 3   levothyroxine (SYNTHROID) 25 MCG tablet Take 2 tablets (50 mcg total) by mouth daily. 180 tablet 3   pantoprazole (PROTONIX) 20 MG tablet Take 1 tablet (20 mg total) by mouth daily. 90 tablet 3   pravastatin (PRAVACHOL) 40 MG tablet TAKE 1 TABLET BY MOUTH EVERY DAY 90 tablet 3   tiZANidine (ZANAFLEX) 2 MG tablet      valsartan (DIOVAN) 160 MG tablet Take 1 tablet (160 mg total) by mouth daily. 90 tablet 3   calcium carbonate (TUMS EX) 750 MG chewable tablet Chew 1 tablet by mouth daily as needed for heartburn. (Patient not taking: Reported on  01/06/2023)     cetirizine (ZYRTEC) 10 MG tablet  (Patient not taking: Reported on 01/06/2023)     cromolyn (OPTICROM) 4 % ophthalmic solution  (Patient not taking: Reported on 01/06/2023)     docusate sodium (COLACE) 100 MG capsule Take 1 capsule (100 mg total) by mouth 2 (two) times daily. 10 capsule 0   FLUoxetine (PROZAC) 20 MG tablet Take 0.5 tablets (10 mg total) by mouth daily for 7 days, THEN 1 tablet (20 mg total) daily. (Patient not taking: Reported on 01/06/2023) 90 tablet 0   hydrOXYzine (ATARAX) 10 MG tablet Take 1 tablet (10 mg total) by  mouth 3 (three) times daily as needed for itching. (Patient not taking: Reported on 01/06/2023) 30 tablet 0   LORazepam (ATIVAN) 0.5 MG tablet TAKE 1 TABLET BY MOUTH EVERY DAY AS NEEDED FOR ANXIETY (Patient not taking: Reported on 01/06/2023) 30 tablet 2   torsemide (DEMADEX) 20 MG tablet TAKE 40 MG BY MOUTH IN THE MORNING. 90 tablet 3   traZODone (DESYREL) 50 MG tablet Take 0.5-1 tablets (25-50 mg total) by mouth at bedtime as needed for sleep. (Patient not taking: Reported on 01/06/2023) 30 tablet 5   triamcinolone cream (KENALOG) 0.1 % Apply 1 Application topically 2 (two) times daily. For 2 weeks, then as needed (Patient not taking: Reported on 01/06/2023) 45 g 0   No current facility-administered medications for this visit.    Allergies:   Fluticasone-salmeterol, Sulfa antibiotics, Sulfonamide derivatives, Tetracycline hcl, Benzonatate, Erythromycin, Meloxicam, and Sulfamethoxazole   Social History   Socioeconomic History   Marital status: Widowed    Spouse name: Not on file   Number of children: Not on file   Years of education: Not on file   Highest education level: Not on file  Occupational History    Employer: RETIRED    Comment: Retired Heritage manager at [employer]  Tobacco Use   Smoking status: Never   Smokeless tobacco: Never  Vaping Use   Vaping status: Never Used  Substance and Sexual Activity   Alcohol use: No   Drug  use: No   Sexual activity: Not on file  Other Topics Concern   Not on file  Social History Narrative   Not on file   Social Determinants of Health   Financial Resource Strain: Low Risk  (12/13/2022)   Overall Financial Resource Strain (CARDIA)    Difficulty of Paying Living Expenses: Not hard at all  Food Insecurity: No Food Insecurity (12/13/2022)   Hunger Vital Sign    Worried About Running Out of Food in the Last Year: Never true    Ran Out of Food in the Last Year: Never true  Transportation Needs: No Transportation Needs (12/13/2022)   PRAPARE - Administrator, Civil Service (Medical): No    Lack of Transportation (Non-Medical): No  Physical Activity: Insufficiently Active (12/13/2022)   Exercise Vital Sign    Days of Exercise per Week: 5 days    Minutes of Exercise per Session: 10 min  Stress: No Stress Concern Present (12/13/2022)   [employer] of Occupational Health - Occupational Stress Questionnaire    Feeling of Stress : Not at all  Social Connections: Moderately Isolated (12/13/2022)   Social Connection and Isolation Panel [NHANES]    Frequency of Communication with Friends and Family: More than three times a week    Frequency of Social Gatherings with Friends and Family: More than three times a week    Attends Religious Services: More than 4 times per year    Active Member of [employer] or Organizations: No    Attends Banker Meetings: Never    Marital Status: Widowed     Family History:  The patient's family history includes Cancer in her father; Stroke in her mother and sister.   ROS:   Please see the history of present illness.    Review of Systems  Musculoskeletal:  Positive for arthritis, back pain, muscle cramps and stiffness.   All other systems reviewed and are negative.   PHYSICAL EXAM:   VS:  BP (!) 112/48 (BP Location: Left Arm, Patient Position: Sitting,  Cuff Size: Normal)   Pulse 84   Ht 4\' 11"  (1.499 m)   Wt 145 lb 6.4  oz (66 kg)   SpO2 93%   BMI 29.37 kg/m      General: Alert, oriented x3, no distress, Healthy left subclavian pacemaker site Head: no evidence of trauma, PERRL, EOMI, no exophtalmos or lid lag, no myxedema, no xanthelasma; normal ears, nose and oropharynx Neck: normal jugular venous pulsations and no hepatojugular reflux; brisk carotid pulses without delay and no carotid bruits Chest: clear to auscultation, no signs of consolidation by percussion or palpation, normal fremitus, symmetrical and full respiratory excursions Cardiovascular: normal position and quality of the apical impulse, regular rhythm, normal first and second heart sounds, no murmurs, rubs or gallops Abdomen: no tenderness or distention, no masses by palpation, no abnormal pulsatility or arterial bruits, normal bowel sounds, no hepatosplenomegaly Extremities: 1+ symmetrical calf edema, some varicose veins; 2+ radial, ulnar and brachial pulses bilaterally; 2+ right femoral, posterior tibial and dorsalis pedis pulses; 2+ left femoral, posterior tibial and dorsalis pedis pulses; no subclavian or femoral bruits Neurological: grossly nonfocal Psych: Normal mood and affect     Wt Readings from Last 3 Encounters:  01/06/23 145 lb 6.4 oz (66 kg)  01/03/23 145 lb 12.8 oz (66.1 kg)  12/13/22 150 lb (68 kg)      Studies/Labs Reviewed:   ECHO 07/15/2021   1. Left ventricular ejection fraction, by estimation, is 60 to 65%. The left ventricle has normal function. The left ventricle has no regional wall motion abnormalities. There is mild left ventricular hypertrophy. Left ventricular diastolic parameters are consistent with Grade I diastolic dysfunction (impaired relaxation).   2. Right ventricular systolic function is normal. The right ventricular size is normal. The estimated right ventricular systolic pressure is 31.5 mmHg.   3. Left atrial size was mildly dilated.   4. The mitral valve is normal in structure. Trivial mitral  valve  regurgitation. No evidence of mitral stenosis.   5. The aortic valve is tricuspid. Aortic valve regurgitation is not  visualized. No aortic stenosis is present.   6. The inferior vena cava is normal in size with greater than 50%  respiratory variability, suggesting right atrial pressure of 3 mmHg.   EKG:  EKG is not ordered today.  Review of the ECG from 07/15/2021 shows atrial sensed (sinus), ventricular paced rhythm.  Recent Labs: 10/25/2022: ALT 8; Hemoglobin 10.5; Magnesium 2.3; Platelets 263; TSH 4.240 01/03/2023: BUN 68; Creatinine, Ser 1.83; NT-Pro BNP 480; Potassium 3.6; Sodium 134    Lipid Panel    Component Value Date/Time   CHOL 134 04/30/2022 1211   TRIG 100.0 04/30/2022 1211   HDL 54.80 04/30/2022 1211   CHOLHDL 2 04/30/2022 1211   VLDL 20.0 04/30/2022 1211   LDLCALC 59 04/30/2022 1211    ASSESSMENT:    1. Chronic diastolic heart failure (HCC)   2. CHB (complete heart block) (HCC)   3. SSS (sick sinus syndrome) (HCC)   4. Pacemaker   5. PAT (paroxysmal atrial tachycardia)   6. History of atrial fibrillation   7. Essential hypertension   8. Hypercholesteremia   9. Prediabetes   10. Stage 3a chronic kidney disease (CKD) (HCC)   11. Acute renal failure superimposed on stage 3a chronic kidney disease, unspecified acute renal failure type (HCC)   12. Medication management         PLAN:  In order of problems listed above:  CHF: Although in the past she had edema  and a mildly elevated BNP suggesting diastolic heart failure, she now has a markedly elevated creatinine, low diastolic blood pressure and clinical complaints that suggest dehydration.  She does not have shortness of breath or edema.  Will cut back her torsemide to only 40 mg once daily.  Encouraged her to increase water intake.  Consider an SGLT2 inhibitor if problems with hypervolemia recur. CHB: Underlying rhythm is sinus rhythm with complete heart block.  She does not have an underlying escape  rhythm.  Pacemaker dependent. SSS: Heart rate histogram distribution is appropriate considering that she is very sedentary.  She only has about 50% atrial pacing. PPM: Device function is well, but it is very important that we establish a reliable pattern of remote downloads.  Have reached out to our device clinic and pacemaker representative to figure out why the device is not working. PAT: Rare, brief, asymptomatic events.  On metoprolol and diltiazem.  I think we should stop the metoprolol.  She will wean off it over the next 3-4 days by cutting the dose in half. AFib: Recorded as part of her medical record in the remote past, but had not been seen during pacemaker checks for the last 10 years except for as Ingal episode on 09/07/2021 with possible atrial flutter or atrial fibrillation that lasted for over 3 hours, but unfortunately electrogram cannot be reviewed.  She is currently not on anticoagulation.  No atrial fibrillation or flutter has been recorded in the last 16 months. HTN: Probably excessively controlled.  Diastolic blood pressure may be low due to relative hypovolemia so we are cutting back on her torsemide.  Will also discontinue her metoprolol. HLP: Well-controlled on current medications.  Excellent LDL on pravastatin. PreDM: Diet controlled. Mild AKI on CKD 3: Reduce the diuretic to 40 mg once daily.  Encouraged intake of water.  Recheck lab in a few weeks.   Medication Adjustments/Labs and Tests Ordered: Current medicines are reviewed at length with the patient today.  Concerns regarding medicines are outlined above.  Medication changes, Labs and Tests ordered today are listed below. Patient Instructions  Medication Instructions:  Stop Metoprolol START Torsemide 40 mg every morning *If you need a refill on your cardiac medications before your next appointment, please call your pharmacy*   Lab Work: BMP and BNP- Please return for Blood Work in 1 month. No appointment needed,  lab here at the office is open Monday-Friday from 8AM to 4PM and closed daily for lunch from 12:45-1:45.   If you have labs (blood work) drawn today and your tests are completely normal, you will receive your results only by: MyChart Message (if you have MyChart) OR A paper copy in the mail If you have any lab test that is abnormal or we need to change your treatment, we will call you to review the results.    Follow-Up: At [facility], you and your health needs are our priority.  As part of our continuing mission to provide you with exceptional heart care, we have created designated Provider Care Teams.  These Care Teams include your primary Cardiologist (physician) and Advanced Practice Providers (APPs -  Physician Assistants and Nurse Practitioners) who all work together to provide you with the care you need, when you need it.  We recommend signing up for the patient portal called "MyChart".  Sign up information is provided on this After Visit Summary.  MyChart is used to connect with patients for Virtual Visits (Telemedicine).  Patients are able to view  lab/test results, encounter notes, upcoming appointments, etc.  Non-urgent messages can be sent to your provider as well.   To learn more about what you can do with MyChart, go to [email or web address].    Your next appointment:   6 month(s)  Provider:   Dr Royann Shivers     Patient Instructions  Medication Instructions:  Stop Metoprolol START Torsemide 40 mg every morning *If you need a refill on your cardiac medications before your next appointment, please call your pharmacy*   Lab Work: BMP and BNP- Please return for Blood Work in 1 month. No appointment needed, lab here at the office is open Monday-Friday from 8AM to 4PM and closed daily for lunch from 12:45-1:45.   If you have labs (blood work) drawn today and your tests are completely normal, you will receive your results only by: MyChart Message (if you have MyChart)  OR A paper copy in the mail If you have any lab test that is abnormal or we need to change your treatment, we will call you to review the results.    Follow-Up: At [facility], you and your health needs are our priority.  As part of our continuing mission to provide you with exceptional heart care, we have created designated Provider Care Teams.  These Care Teams include your primary Cardiologist (physician) and Advanced Practice Providers (APPs -  Physician Assistants and Nurse Practitioners) who all work together to provide you with the care you need, when you need it.  We recommend signing up for the patient portal called "MyChart".  Sign up information is provided on this After Visit Summary.  MyChart is used to connect with patients for Virtual Visits (Telemedicine).  Patients are able to view lab/test results, encounter notes, upcoming appointments, etc.  Non-urgent messages can be sent to your provider as well.   To learn more about what you can do with MyChart, go to [email or web address].    Your next appointment:   6 month(s)  Provider:   Dr Royann Shivers    Signed, Thurmon Fair, MD  01/06/2023 5:37 PM    [facility] [address] [place], [place], [place]  [address] Phone: [phone]; Fax: [phone]

## 2023-01-06 NOTE — Patient Instructions (Signed)
Medication Instructions:  Stop Metoprolol START Torsemide 40 mg every morning *If you need a refill on your cardiac medications before your next appointment, please call your pharmacy*   Lab Work: BMP and BNP- Please return for Blood Work in 1 month. No appointment needed, lab here at the office is open Monday-Friday from 8AM to 4PM and closed daily for lunch from 12:45-1:45.   If you have labs (blood work) drawn today and your tests are completely normal, you will receive your results only by: MyChart Message (if you have MyChart) OR A paper copy in the mail If you have any lab test that is abnormal or we need to change your treatment, we will call you to review the results.    Follow-Up: At [facility], you and your health needs are our priority.  As part of our continuing mission to provide you with exceptional heart care, we have created designated Provider Care Teams.  These Care Teams include your primary Cardiologist (physician) and Advanced Practice Providers (APPs -  Physician Assistants and Nurse Practitioners) who all work together to provide you with the care you need, when you need it.  We recommend signing up for the patient portal called "MyChart".  Sign up information is provided on this After Visit Summary.  MyChart is used to connect with patients for Virtual Visits (Telemedicine).  Patients are able to view lab/test results, encounter notes, upcoming appointments, etc.  Non-urgent messages can be sent to your provider as well.   To learn more about what you can do with MyChart, go to [email or web address].    Your next appointment:   6 month(s)  Provider:   Dr Royann Shivers

## 2023-01-06 NOTE — Progress Notes (Signed)
Please call patient:kidney function is slightly worsening. I recommend decreasing her torsemide to 20mg  twice a day. Keep wearing the compression stockings as well. Low salt diet.

## 2023-01-10 ENCOUNTER — Encounter: Payer: Self-pay | Admitting: Podiatry

## 2023-01-31 ENCOUNTER — Telehealth: Payer: Self-pay

## 2023-01-31 NOTE — Telephone Encounter (Signed)
Pt needs help with monitor but is not able to at this moment and she will call tech support when a family member comes by to help her

## 2023-02-04 DIAGNOSIS — I5032 Chronic diastolic (congestive) heart failure: Secondary | ICD-10-CM | POA: Diagnosis not present

## 2023-02-04 DIAGNOSIS — N1831 Chronic kidney disease, stage 3a: Secondary | ICD-10-CM | POA: Diagnosis not present

## 2023-02-04 DIAGNOSIS — Z79899 Other long term (current) drug therapy: Secondary | ICD-10-CM | POA: Diagnosis not present

## 2023-02-22 DIAGNOSIS — H16143 Punctate keratitis, bilateral: Secondary | ICD-10-CM | POA: Diagnosis not present

## 2023-02-22 DIAGNOSIS — H10413 Chronic giant papillary conjunctivitis, bilateral: Secondary | ICD-10-CM | POA: Diagnosis not present

## 2023-02-22 DIAGNOSIS — D3131 Benign neoplasm of right choroid: Secondary | ICD-10-CM | POA: Diagnosis not present

## 2023-02-22 DIAGNOSIS — H04203 Unspecified epiphora, bilateral lacrimal glands: Secondary | ICD-10-CM | POA: Diagnosis not present

## 2023-02-22 DIAGNOSIS — Z961 Presence of intraocular lens: Secondary | ICD-10-CM | POA: Diagnosis not present

## 2023-02-22 DIAGNOSIS — H04123 Dry eye syndrome of bilateral lacrimal glands: Secondary | ICD-10-CM | POA: Diagnosis not present

## 2023-02-22 DIAGNOSIS — H26491 Other secondary cataract, right eye: Secondary | ICD-10-CM | POA: Diagnosis not present

## 2023-03-04 ENCOUNTER — Other Ambulatory Visit: Payer: Self-pay | Admitting: Family Medicine

## 2023-03-04 ENCOUNTER — Other Ambulatory Visit: Payer: Self-pay | Admitting: Cardiovascular Disease

## 2023-03-04 DIAGNOSIS — E039 Hypothyroidism, unspecified: Secondary | ICD-10-CM

## 2023-03-22 DIAGNOSIS — M5416 Radiculopathy, lumbar region: Secondary | ICD-10-CM | POA: Diagnosis not present

## 2023-03-26 DIAGNOSIS — Z23 Encounter for immunization: Secondary | ICD-10-CM | POA: Diagnosis not present

## 2023-03-31 ENCOUNTER — Ambulatory Visit: Payer: Medicare Other | Admitting: Cardiovascular Disease

## 2023-04-06 ENCOUNTER — Ambulatory Visit (INDEPENDENT_AMBULATORY_CARE_PROVIDER_SITE_OTHER): Payer: Medicare Other | Admitting: Podiatry

## 2023-04-06 ENCOUNTER — Encounter: Payer: Self-pay | Admitting: Podiatry

## 2023-04-06 DIAGNOSIS — M79674 Pain in right toe(s): Secondary | ICD-10-CM | POA: Diagnosis not present

## 2023-04-06 DIAGNOSIS — B351 Tinea unguium: Secondary | ICD-10-CM | POA: Diagnosis not present

## 2023-04-06 DIAGNOSIS — M79675 Pain in left toe(s): Secondary | ICD-10-CM

## 2023-04-12 NOTE — Progress Notes (Signed)
Subjective:  Patient ID: [name], female    DOB: 05-05-1932,  MRN: [id number]  89 y.o. female presents painful thick toenails that are difficult to trim. Pain interferes with ambulation. Aggravating factors include wearing enclosed shoe gear. Pain is relieved with periodic professional debridement. Chief Complaint  Patient presents with   RFC    RFC-No concerns just want nails clipped   New problem(s): None   PCP is Willow Ora, MD , and last visit was January 03, 2023.  Allergies  Allergen Reactions   Fluticasone-Salmeterol Other (See Comments)    unknown Other reaction(s): tongue/throat symptoms   Sulfa Antibiotics     Other reaction(s): gastric intol   Sulfonamide Derivatives Other (See Comments)    unknown   Tetracycline Hcl    Benzonatate Other (See Comments)    tongue/throat symptoms in the past but tolerated more recently Other reaction(s): and tongue/throat symptoms in the past (not more recently)   Erythromycin Nausea And Vomiting and Other (See Comments)    Stomach cramps Other reaction(s): gastric intol   Meloxicam Other (See Comments)    Mild dyspnea Other reaction(s): dyspnea   Sulfamethoxazole Other (See Comments)    Gastric intolerance    Review of Systems: Negative except as noted in the HPI.   Objective:  [name] is a pleasant 89 y.o. female WD, WN in NAD.Marland Kitchen AAO x 3.  Vascular Examination: Capillary refill time <3 seconds b/l LE. Palpable pedal pulses b/l LE. Digital hair decreased b/l. Skin temperature gradient WNL b/l. Trace edema noted BLE.Marland Kitchen  Dermatological Examination: Pedal skin with normal turgor, texture and tone b/l. No open wounds. No interdigital macerations b/l. Toenails 1-5 b/l thickened, discolored, dystrophic with subungual debris. There is pain on palpation to dorsal aspect of nailplates. Skin tear noted plantarcentral aspect right foot.   Minimal porokeratotic lesion(s) lateral midfoot RLE. No erythema, no edema, no drainage,  no fluctuance.  Neurological Examination: Protective sensation intact with 10 gram monofilament b/l LE.   Musculoskeletal Examination: Normal muscle strength 5/5 to all lower extremity muscle groups bilaterally. No pain, crepitus or joint limitation noted with ROM b/l LE. HAV with bunion deformity noted b/l LE. Patient ambulates with walker assistance.  Last A1c:      Latest Ref Rng & Units 01/03/2023    3:37 PM 04/30/2022   12:11 PM  Hemoglobin A1C  Hemoglobin-A1c 4.6 - 6.5 % 6.2  6.1     Assessment:   1. Pain due to onychomycosis of toenails of both feet    Plan:  -Consent given for treatment as described below: -Examined patient. -Patient to continue soft, supportive shoe gear daily. -Mycotic toenails 1-5 bilaterally were debrided in length and girth with sterile nail nippers and dremel without incident. -Patient/POA to call should there be question/concern in the interim.  Return in about 3 months (around 07/07/2023).  Freddie Breech, DPM

## 2023-04-14 ENCOUNTER — Other Ambulatory Visit: Payer: Self-pay | Admitting: Family Medicine

## 2023-04-14 DIAGNOSIS — E039 Hypothyroidism, unspecified: Secondary | ICD-10-CM

## 2023-04-23 ENCOUNTER — Other Ambulatory Visit: Payer: Self-pay | Admitting: Physician Assistant

## 2023-05-11 ENCOUNTER — Encounter: Payer: Self-pay | Admitting: Family Medicine

## 2023-05-11 ENCOUNTER — Ambulatory Visit: Payer: Medicare Other | Admitting: Family Medicine

## 2023-05-11 VITALS — BP 138/58 | HR 77 | Temp 98.3°F | Ht 59.0 in | Wt 142.0 lb

## 2023-05-11 DIAGNOSIS — J9801 Acute bronchospasm: Secondary | ICD-10-CM

## 2023-05-11 DIAGNOSIS — F320 Major depressive disorder, single episode, mild: Secondary | ICD-10-CM

## 2023-05-11 DIAGNOSIS — R051 Acute cough: Secondary | ICD-10-CM

## 2023-05-11 DIAGNOSIS — J208 Acute bronchitis due to other specified organisms: Secondary | ICD-10-CM

## 2023-05-11 DIAGNOSIS — B9689 Other specified bacterial agents as the cause of diseases classified elsewhere: Secondary | ICD-10-CM

## 2023-05-11 LAB — POC COVID19 BINAXNOW: SARS Coronavirus 2 Ag: NEGATIVE

## 2023-05-11 MED ORDER — PREDNISONE 20 MG PO TABS: ORAL_TABLET | ORAL | 0 refills | Status: DC

## 2023-05-11 MED ORDER — DOXYCYCLINE HYCLATE 100 MG PO CAPS: 100.0000 mg | ORAL_CAPSULE | Freq: Two times a day (BID) | ORAL | 0 refills | Status: DC

## 2023-05-11 NOTE — Patient Instructions (Addendum)
Please follow up as scheduled for your next visit with me: 07/06/2023   If you have any questions or concerns, please don't hesitate to send me a message via MyChart or call the office at [phone]. Thank you for visiting with [place] today! It's our pleasure caring for you.   VISIT SUMMARY:  You came in today with a persistent cough that has lasted for a week. Initially, you had a sore throat and runny nose, but those symptoms have resolved. You also mentioned feeling generally unwell and experiencing loneliness, but you have decided not to start Prozac at this time.  YOUR PLAN:  -ACUTE BRONCHITIS: Acute bronchitis is an inflammation of the bronchial tubes in the lungs, often causing coughing and mucus production. We will treat this with antibiotic capsules to cover potential bacterial causes, over-the-counter cough syrups like Delsym or Mucinex DM to help manage the cough, and prednisone to reduce inflammation and wheezing. If your symptoms do not improve, we may consider a chest x-ray.  -MOOD DISORDER: A mood disorder involves persistent feelings of sadness or periods of feeling overly happy, or fluctuations from extreme happiness to extreme sadness. You have decided not to start Prozac at this time, but we will monitor your mood and reconsider Prozac if your symptoms worsen.  INSTRUCTIONS:  Please follow up after the new year if your symptoms persist or worsen.

## 2023-05-11 NOTE — Progress Notes (Signed)
Subjective  CC:  Chief Complaint  Patient presents with   Cough    Pt has been coughing for the past week. Has not gotten any worse     HPI: [name] is a 89 y.o. female who presents to the office today to address the problems listed above in the chief complaint. Discussed the use of AI scribe software for clinical note transcription with the patient, who gave verbal consent to proceed.  History of Present Illness   The patient presents with a week-long history of a persistent cough, initially accompanied by a sore throat and rhinorrhea. The cough is described as severe, with episodes lasting two to three minutes. The patient reports occasional expectoration but denies any dyspnea or fever. The patient also denies any throat soreness at the time of consultation. No sob, fevers, chills, gi sxs or myalgias. The patient describes a general feeling of malaise. No cp  The patient also mentions a history of loneliness and feelings of being down, but has decided against taking Prozac, which was previously discussed. See last note. The patient spends most days alone at home and reports a lack of frequent contact with family members.      Assessment  1. Acute bacterial bronchitis   2. Acute cough   3. Bronchospasm   4. Depression, major, single episode, mild (HCC)      Plan  Assessment and Plan    Acute Bronchitis Cough for one week with initial sore throat and rhinorrhea. Coughing spells lasting 2-3 minutes, occasional sputum production, no fever, no dyspnea. Wheezing and congestion noted on examination. Differential includes bronchitis and possible pneumonia, though pneumonia is less likely given the absence of fever and significant respiratory distress. Discussed treatment options including antibiotics, over-the-counter cough syrups, and prednisone. Explained that antibiotics will cover potential bacterial causes, while prednisone will help reduce inflammation and wheezing. Discussed  the option of a chest x-ray if symptoms do not improve. - Prescribe antibiotic capsules, doxy 100 bid. Pt has tolerated doxy in past - Recommend over-the-counter cough syrups (Delsym or Mucinex DM) - Prescribe prednisone for wheezing and chest inflammation, 40mg  daily for 5 days  Mood Disorder Previously discussed starting Prozac, patient declined. Reports feeling lonely and staying home alone daily, but mood is currently well-managed. Discussed that Prozac can be reconsidered if mood worsens. - Remove Prozac from medication list - Monitor mood and reconsider Prozac if symptoms worsen  Follow-up - Follow up after the new year if symptoms persist or worsen.     Follow up: for cpe as scheduled 07/06/2023  Orders Placed This Encounter  Procedures   POC COVID-19   Meds ordered this encounter  Medications   doxycycline (VIBRAMYCIN) 100 MG capsule    Sig: Take 1 capsule (100 mg total) by mouth 2 (two) times daily.    Dispense:  14 capsule    Refill:  0   predniSONE (DELTASONE) 20 MG tablet    Sig: Take 2 tabs daily for 5 days    Dispense:  10 tablet    Refill:  0      I reviewed the patients updated PMH, FH, and SocHx.    Patient Active Problem List   Diagnosis Date Noted   Chronic diastolic heart failure (HCC) 03/03/2022    Priority: High   Stage 3a chronic kidney disease (CKD) (HCC) 04/27/2021    Priority: High   SSS (sick sinus syndrome) (HCC) 06/15/2017    Priority: High   PAT (paroxysmal atrial tachycardia) (  HCC) 06/15/2017    Priority: High   Impaired fasting glucose 07/27/2016    Priority: High   CHB (complete heart block) [facility]) s/p pacemaker 05/17/2013    Priority: High   Pacemaker 02/04/2013    Priority: High   Essential hypertension 02/04/2013    Priority: High   Mixed hyperlipidemia 02/04/2013    Priority: High   Osteopenia 04/27/2021    Priority: Medium    Adjustment disorder with anxiety 04/27/2021    Priority: Medium    Early stage nonexudative  age-related macular degeneration of right eye 03/06/2020    Priority: Medium    Early stage nonexudative age-related macular degeneration of left eye 03/06/2020    Priority: Medium    Laryngopharyngeal reflux (LPR) 03/11/2017    Priority: Medium    Primary hypothyroidism 07/27/2016    Priority: Medium    Lower extremity edema 11/22/2014    Priority: Medium    Asthma 02/20/2010    Priority: Medium    GERD (gastroesophageal reflux disease) 10/26/2007    Priority: Medium    Chronic cough 10/26/2007    Priority: Medium    Choroidal nevus of right eye 03/06/2020    Priority: Low   Pseudophakia 03/06/2020    Priority: Low   Vitamin D deficiency 07/27/2016    Priority: Low   Allergic rhinitis due to pollen 08/21/2010    Priority: Low   Dyslipidemia 01/04/2023   Depression, major, single episode, mild (HCC) 01/03/2023   Venous insufficiency of both lower extremities 05/31/2022   Primary insomnia 10/28/2021   Normocytic anemia 07/14/2021   Current Meds  Medication Sig   acetaminophen (TYLENOL) 500 MG tablet Take 500 mg by mouth as needed for mild pain.   Ascorbic Acid (VITAMIN C) 500 MG CHEW Chew 500 mg by mouth daily.   aspirin 81 MG chewable tablet Chew 81 mg by mouth at bedtime.   calcium carbonate (TUMS EX) 750 MG chewable tablet Chew 1 tablet by mouth daily as needed for heartburn.   cetirizine (ZYRTEC) 10 MG tablet    cholecalciferol (VITAMIN D3) 25 MCG (1000 UNIT) tablet Take 1 tablet (1,000 Units total) by mouth daily.   diltiazem (CARDIZEM CD) 120 MG 24 hr capsule TAKE 1 CAPSULE (120 MG TOTAL) BY MOUTH AT BEDTIME.   doxycycline (VIBRAMYCIN) 100 MG capsule Take 1 capsule (100 mg total) by mouth 2 (two) times daily.   hydrOXYzine (ATARAX) 10 MG tablet Take 1 tablet (10 mg total) by mouth 3 (three) times daily as needed for itching.   KLOR-CON M20 20 MEQ tablet TAKE 1 TABLET (20 MEQ TOTAL) BY MOUTH DAILY. TAKE AN EXTRA TABLET ONCE DAILY WHEN YOU TAKE THE EXTRA FUROSEMIDE    levothyroxine (SYNTHROID) 25 MCG tablet TAKE 1 TABLET BY MOUTH EVERY DAY   LORazepam (ATIVAN) 0.5 MG tablet TAKE 1 TABLET BY MOUTH EVERY DAY AS NEEDED FOR ANXIETY   pantoprazole (PROTONIX) 20 MG tablet TAKE 1 TABLET BY MOUTH EVERY DAY   pravastatin (PRAVACHOL) 40 MG tablet TAKE 1 TABLET BY MOUTH EVERY DAY   predniSONE (DELTASONE) 20 MG tablet Take 2 tabs daily for 5 days   torsemide (DEMADEX) 20 MG tablet TAKE 2 TABLETS (40 MG TOTAL) BY MOUTH IN THE MORNING AND 1 TABLET (20 MG TOTAL) EVERY EVENING.   traZODone (DESYREL) 50 MG tablet Take 0.5-1 tablets (25-50 mg total) by mouth at bedtime as needed for sleep.   valsartan (DIOVAN) 160 MG tablet Take 1 tablet (160 mg total) by mouth daily.  Allergies: Patient is allergic to fluticasone-salmeterol, sulfa antibiotics, sulfonamide derivatives, tetracycline hcl, benzonatate, erythromycin, meloxicam, and sulfamethoxazole. Family History: Patient family history includes Cancer in her father; Stroke in her mother and sister. Social History:  Patient  reports that she has never smoked. She has never used smokeless tobacco. She reports that she does not drink alcohol and does not use drugs.  Review of Systems: Constitutional: Negative for fever malaise or anorexia Cardiovascular: negative for chest pain Respiratory: negative for SOB or persistent cough Gastrointestinal: negative for abdominal pain  Objective  Vitals: BP (!) 138/58   Pulse 77   Temp 98.3 F (36.8 C)   Ht 4\' 11"  (1.499 m)   Wt 142 lb (64.4 kg)   SpO2 97%   BMI 28.68 kg/m  General: no acute distress , A&Ox3, no respiratory distress. Some coughing HEENT: PEERL, conjunctiva normal, neck is supple Cardiovascular:  RRR without murmur or gallop.  Respiratory:  bilateral rhonchi and wheezing Skin:  Warm, no rashes  Covid neg in office today Commons side effects, risks, benefits, and alternatives for medications and treatment plan prescribed today were discussed, and the patient  expressed understanding of the given instructions. Patient is instructed to call or message via MyChart if he/she has any questions or concerns regarding our treatment plan. No barriers to understanding were identified. We discussed Red Flag symptoms and signs in detail. Patient expressed understanding regarding what to do in case of urgent or emergency type symptoms.  Medication list was reconciled, printed and provided to the patient in AVS. Patient instructions and summary information was reviewed with the patient as documented in the AVS. This note was prepared with assistance of Dragon voice recognition software. Occasional wrong-word or sound-a-like substitutions may have occurred due to the inherent limitations of voice recognition software

## 2023-05-30 ENCOUNTER — Other Ambulatory Visit: Payer: Self-pay | Admitting: Cardiovascular Disease

## 2023-05-31 ENCOUNTER — Telehealth: Payer: Self-pay | Admitting: Cardiovascular Disease

## 2023-05-31 ENCOUNTER — Other Ambulatory Visit: Payer: Self-pay | Admitting: Cardiovascular Disease

## 2023-05-31 MED ORDER — VALSARTAN 160 MG PO TABS: 160.0000 mg | ORAL_TABLET | Freq: Every day | ORAL | 2 refills | Status: DC

## 2023-05-31 NOTE — Telephone Encounter (Signed)
*  STAT* If patient is at the pharmacy, call can be transferred to refill team.   1. Which medications need to be refilled? (please list name of each medication and dose if known) new prescription for Valsartan   2. Would you like to learn more about the convenience, safety, & potential cost savings by using the [facility]?     3. Are you open to using the [facility] (Type [facility].   4. Which pharmacy/location (including street and city if local pharmacy) is medication to be sent to?[facility] [place], [place]   5. Do they need a 30 day or 90 day supply? 90 days and refills-- please call in today

## 2023-05-31 NOTE — Telephone Encounter (Signed)
*  STAT* If patient is at the pharmacy, call can be transferred to refill team.   1. Which medications need to be refilled? (please list name of each medication and dose if known) valsartan (DIOVAN) 160 MG tablet   2. Which pharmacy/location (including street and city if local pharmacy) is medication to be sent to? [facility] [place] [place] [facility] [address]  3. Do they need a 30 day or 90 day supply? 90 day

## 2023-06-30 ENCOUNTER — Ambulatory Visit: Payer: Medicare Other | Admitting: Family

## 2023-06-30 ENCOUNTER — Encounter (HOSPITAL_BASED_OUTPATIENT_CLINIC_OR_DEPARTMENT_OTHER): Payer: Medicare Other | Attending: Internal Medicine | Admitting: Internal Medicine

## 2023-06-30 DIAGNOSIS — L97821 Non-pressure chronic ulcer of other part of left lower leg limited to breakdown of skin: Secondary | ICD-10-CM | POA: Insufficient documentation

## 2023-06-30 DIAGNOSIS — Z950 Presence of cardiac pacemaker: Secondary | ICD-10-CM | POA: Diagnosis not present

## 2023-06-30 DIAGNOSIS — J45909 Unspecified asthma, uncomplicated: Secondary | ICD-10-CM | POA: Diagnosis not present

## 2023-06-30 DIAGNOSIS — I87312 Chronic venous hypertension (idiopathic) with ulcer of left lower extremity: Secondary | ICD-10-CM | POA: Insufficient documentation

## 2023-06-30 DIAGNOSIS — I87311 Chronic venous hypertension (idiopathic) with ulcer of right lower extremity: Secondary | ICD-10-CM | POA: Insufficient documentation

## 2023-06-30 DIAGNOSIS — I130 Hypertensive heart and chronic kidney disease with heart failure and stage 1 through stage 4 chronic kidney disease, or unspecified chronic kidney disease: Secondary | ICD-10-CM | POA: Insufficient documentation

## 2023-06-30 DIAGNOSIS — N1831 Chronic kidney disease, stage 3a: Secondary | ICD-10-CM | POA: Insufficient documentation

## 2023-06-30 DIAGNOSIS — L97811 Non-pressure chronic ulcer of other part of right lower leg limited to breakdown of skin: Secondary | ICD-10-CM | POA: Diagnosis not present

## 2023-06-30 DIAGNOSIS — I890 Lymphedema, not elsewhere classified: Secondary | ICD-10-CM | POA: Insufficient documentation

## 2023-07-01 ENCOUNTER — Ambulatory Visit: Payer: Medicare Other | Admitting: Cardiovascular Disease

## 2023-07-01 NOTE — Progress Notes (Signed)
 [name], [name] ([id number]) [id number] Page 1 of 4 Visit Report for 06/30/2023 Abuse Risk Screen Details Patient Name: Date of Service: PA [name], [name] 06/30/2023 8:00 A M Medical Record Number: [id number] Patient Account Number: [id number] Date of Birth/Sex: Treating RN: 1931/10/29 (89 y.o. [name] Primary Care [name]: [name] Other Clinician: Referring Witney Huie: Treating Argus Caraher/Extender: [name] [name]: 0 Abuse Risk Screen Items Answer ABUSE RISK SCREEN: Has anyone close to you tried to hurt or harm you recentlyo No Do you feel uncomfortable with anyone in your familyo No Has anyone forced you do things that you didnt want to doo No Electronic Signature(s) Signed: 06/30/2023 6:28:11 PM By: [name] Pollen RN Entered By: [name] on 06/30/2023 05:24:25 -------------------------------------------------------------------------------- Activities of Daily Living Details Patient Name: Date of Service: PA [name], [name] 06/30/2023 8:00 A M Medical Record Number: [id number] Patient Account Number: [id number] Date of Birth/Sex: Treating RN: 1931-09-22 (89 y.o. [name] Primary Care [name]: [name] Other Clinician: Referring Candiace West: Treating Zamire Whitehurst/Extender: [name] [name]: 0 Activities of Daily Living Items Answer Activities of Daily Living (Please select one for each item) Drive Automobile Not Able T Medications ake Completely Able Use T elephone Completely Able Care for Appearance Completely Able Use T oilet Completely Able Bath / Shower Need Assistance Dress Self Completely Able Feed Self Completely Able Walk Need Assistance Get In / Out Bed Completely Able Housework Need Assistance Prepare Meals Need Assistance Handle Money Need Assistance Shop for Self Need Assistance Electronic Signature(s) Signed: 06/30/2023 6:28:11 PM By:  [name] Pollen RN Entered By: [name] on 06/30/2023 05:25:12 [name], [name] ([id number]) [id number] Page 2 of 4 -------------------------------------------------------------------------------- Education Screening Details Patient Name: Date of Service: PA [name], [name] 06/30/2023 8:00 A M Medical Record Number: [id number] Patient Account Number: [id number] Date of Birth/Sex: Treating RN: 1932/02/03 (89 y.o. [name] Primary Care [name]: [name] Other Clinician: Referring [name]: Treating [name] [name] [name] in Treatment: 0 Primary Learner Assessed: Patient Learning Preferences/Education Level/Primary Language Learning Preference: Explanation, Demonstration, Printed Material Highest Education Level: College or Above Preferred Language: English Cognitive Barrier Language Barrier: No Translator Needed: No Memory Deficit: No Emotional Barrier: No Cultural/Religious Beliefs Affecting Medical Care: No Physical Barrier Impaired Vision: Yes Impaired vision Impaired Hearing: No Decreased Hand dexterity: No Knowledge/Comprehension Knowledge Level: High Comprehension Level: High Ability to understand written instructions: High Ability to understand verbal instructions: High Motivation Anxiety Level: Calm Cooperation: Cooperative Education Importance: Acknowledges Need Interest in Health Problems: Asks Questions Perception: Coherent Willingness to Engage in Self-Management High Activities: Readiness to Engage in Self-Management High Activities: Electronic Signature(s) Signed: 06/30/2023 6:28:11 PM By: [name] Pollen RN Entered By: [name] on 06/30/2023 05:26:37 -------------------------------------------------------------------------------- Fall Risk Assessment Details Patient Name: Date of Service: PA [name], [name] 06/30/2023 8:00 A M Medical Record Number: [id number] Patient  Account Number: [id number] Date of Birth/Sex: Treating RN: Aug 12, 1931 (89 y.o. [name] Primary Care [name]: [name] Other Clinician: Referring [name]: Treating [name]/Extender: [name] [name]: 0 Fall Risk Assessment Items Have you had 2 or more falls in the last 12 monthso 0 No [name], [name] ([id number]) [id number] Page 3 of 4 Have you had any fall that resulted in injury in the last 12 monthso 0 No [name] 0 No Secondary diagnosis (Do you have 2 or more medical diagnoseso) 0 No Ambulatory aid None/bed rest/wheelchair/nurse 0 No  Crutches/cane/walker 0 No Furniture 0 No Intravenous therapy Access/Saline/Heparin Lock 0 No Gait/Transferring Normal/ bed rest/ wheelchair 0 No Weak (short steps with or without shuffle, stooped but able to lift head while walking, may seek 0 No support from furniture) Impaired (short steps with shuffle, may have difficulty arising from chair, head down, impaired 0 No balance) Mental Status Oriented to own ability 0 No Electronic Signature(s) Signed: 06/30/2023 6:28:11 PM By: [name] Pollen RN Entered By: [name] on 06/30/2023 05:26:45 -------------------------------------------------------------------------------- Foot Assessment Details Patient Name: Date of Service: PA [name], [name] 06/30/2023 8:00 A M Medical Record Number: [id number] Patient Account Number: [id number] Date of Birth/Sex: Treating RN: 12-07-1931 (89 y.o. [name] Primary Care Asharia Lotter: [name] Other Clinician: Referring Ignazio Kincaid: Treating Denys Labree/Extender: [name] [name]: 0 Foot Assessment Items Site Locations + = Sensation present, - = Sensation absent, C = Callus, U = Ulcer R = Redness, W = Warmth, M = Maceration, PU = Pre-ulcerative lesion F = Fissure, S = Swelling, D =  Dryness Assessment Right: Left: Other Deformity: No No Prior Foot Ulcer: No No Prior Amputation: No No Charcot Joint: No No Ambulatory Status: Ambulatory With Help Assistance Device: Yania Bogie, Janifer ([id number]) [id number] Page 4 of 4 Gait: Steady Electronic Signature(s) Signed: 06/30/2023 6:28:11 PM By: [name] Pollen RN Entered By: [name] on 06/30/2023 05:31:20 -------------------------------------------------------------------------------- Nutrition Risk Screening Details Patient Name: Date of Service: PA [name], [name] 06/30/2023 8:00 A M Medical Record Number: [id number] Patient Account Number: [id number] Date of Birth/Sex: Treating RN: 1931/10/04 (89 y.o. [name] Primary Care Timothy Trudell: [name] Other Clinician: Referring Ksean Vale: Treating Hilery Wintle/Extender: [name] [name]: 0 Height (in): 59 Weight (lbs): 146 Body Mass Index (BMI): 29.5 Nutrition Risk Screening Items Score Screening NUTRITION RISK SCREEN: I have an illness or condition that made me change the kind and/or amount of food I eat 0 No I eat fewer than two meals per day 0 No I eat few fruits and vegetables, or milk products 0 No I have three or more drinks of beer, liquor or wine almost every day 0 No I have tooth or mouth problems that make it hard for me to eat 2 Yes I don't always have enough money to buy the food I need 0 No I eat alone most of the time 0 No I take three or more different prescribed or over-the-counter drugs a day 1 Yes Without wanting to, I have lost or gained 10 pounds in the last six months 0 No I am not always physically able to shop, cook and/or feed myself 0 No Nutrition Protocols Good Risk Protocol 0 No interventions needed Moderate Risk Protocol High Risk Proctocol Risk Level: Moderate Risk Score: 3 Electronic Signature(s) Signed: 06/30/2023 6:28:11 PM By: [name] Pollen RN Entered  By: [name] on 06/30/2023 05:29:33

## 2023-07-06 ENCOUNTER — Ambulatory Visit: Payer: Medicare Other | Admitting: Family Medicine

## 2023-07-06 NOTE — Progress Notes (Signed)
 [name], [name] ([id number]) [id number] Page 1 of 9 Visit Report for 06/30/2023 Allergy  List Details Patient Name: Date of Service: PA [name], [name] 06/30/2023 8:00 A M Medical Record Number: [id number] Patient Account Number: [id number] Date of Birth/Sex: Treating RN: [birth year] (89 y.o. [name] Primary Care Marites Nath: [name] Other Clinician: Referring Lavender Stanke: Treating Tacia Hindley/Extender: [name] [name]: 0 Allergies Active Allergies fluticasone  sulfa antibiotics Type: Medication tetracycline benzonatate  erythromycin base meloxicam Allergy  Notes Electronic Signature(s) Signed: 06/30/2023 6:28:11 PM By: [name] RN Entered By: Claven [name] on 06/30/2023 08:15:16 -------------------------------------------------------------------------------- Arrival Information Details Patient Name: Date of Service: [name], [name] 06/30/2023 8:00 A M Medical Record Number: [id number] Patient Account Number: [id number] Date of Birth/Sex: Treating RN: [birth year] (89 y.o. [name] Primary Care Channin Agustin: [name] Other Clinician: Referring Saretta Dahlem: Treating Verdine Grenfell/Extender: [name] [name] [name] in Treatment: 0 Visit Information Patient Arrived: Walker Arrival Time: 08:12 Accompanied By: son Transfer Assistance: Manual Patient Identification Verified: Yes History Since Last Visit Added or deleted any medications: No Any new allergies or adverse reactions: No Had a fall or experienced change in activities of daily living that may affect risk of falls: No Signs or symptoms of abuse/neglect since last visito No Hospitalized since last visit: No Implantable device outside of the clinic excluding cellular tissue based products placed in the center since last visit: No Electronic Signature(s) Signed: 06/30/2023 6:28:11 PM By: [name] RN [name],Signed: 06/30/2023 6:28:11 PM By:  [name] RN [name] ([id number]) [id number] Page 2 of 9 Entered By: Claven [name] on 06/30/2023 08:13:21 -------------------------------------------------------------------------------- Compression Therapy Details Patient Name: Date of Service: PA [name], [name] 06/30/2023 8:00 A M Medical Record Number: [id number] Patient Account Number: [id number] Date of Birth/Sex: Treating RN: [birth year] (89 y.o. [name] Primary Care Rodriques Badie: [name] Other Clinician: Referring Shanyce Daris: Treating Tocara Mennen/Extender: [name] [name]: 0 Compression Therapy Performed for Wound Assessment: Wound #5 Right,Anterior Lower Leg Performed By: Clinician [name], RN Compression Type: Nonie Seaman Post Procedure Diagnosis Same as Pre-procedure Electronic Signature(s) Signed: 06/30/2023 6:28:11 PM By: [name] RN Entered By: Claven [name] on 06/30/2023 09:10:07 -------------------------------------------------------------------------------- Compression Therapy Details Patient Name: Date of Service: [name], [name] 06/30/2023 8:00 A M Medical Record Number: [id number] Patient Account Number: [id number] Date of Birth/Sex: Treating RN: Feb 13, 1932 (89 y.o. [name] Primary Care Montae Stager: [name] Other Clinician: Referring Damek Ende: Treating Aleph Nickson/Extender: [name] [name]: 0 Compression Therapy Performed for Wound Assessment: Wound #6 Left,Anterior Lower Leg Performed By: Clinician [name], RN Compression Type: Nonie Seaman Post Procedure Diagnosis Same as Pre-procedure Electronic Signature(s) Signed: 06/30/2023 6:28:11 PM By: [name] RN Entered By: Claven [name] on 06/30/2023 09:10:07 -------------------------------------------------------------------------------- Encounter Discharge Information Details Patient Name: Date of Service: [name], [name]  06/30/2023 8:00 A M Medical Record Number: [id number] Patient Account Number: [id number] Date of Birth/Sex: Treating RN: Apr 07, 1932 (89 y.o. [name] Primary Care Katrice Goel: [name] Other Clinician: Referring Jaelani Posa: Treating Samiyah Stupka/Extender: [name] [name]: 0 [name], [name] ([id number]) [id number] Page 3 of 9 Encounter Discharge Information Items Discharge Condition: Stable Ambulatory Status: Walker Discharge Destination: Home Transportation: Private Auto Accompanied By: self Schedule Follow-up Appointment: Yes Clinical Summary of Care: Patient Declined Electronic Signature(s) Signed: 06/30/2023 6:28:11 PM By: [name] RN Entered By: Claven [name] on 06/30/2023 18:27:40 -------------------------------------------------------------------------------- Lower Extremity Assessment Details Patient Name: Date of Service: [name], [name] 06/30/2023 8:00 A M Medical Record Number: [id number] Patient Account Number:  [id number] Date of Birth/Sex: Treating RN: [birth year] (89 y.o. [name] Primary Care Othelia Riederer: [name] Other Clinician: Referring Teondre Jarosz: Treating Naevia Unterreiner/Extender: [name] [name]: 0 Edema Assessment Assessed: [Left: No] [Right: No] [Left: Edema] [Right: :] Calf Left: Right: Point of Measurement: 27 cm From Medial Instep 40.5 cm 42.5 cm Ankle Left: Right: Point of Measurement: 9 cm From Medial Instep 27 cm 28.5 cm Knee To Floor Left: Right: From Medial Instep 36 cm Vascular Assessment Blood Pressure: Brachial: [Left:147] [Right:147] Ankle: [Left:Dorsalis Pedis: 178 1.21] [Right:Dorsalis Pedis: 170 1.16] Electronic Signature(s) Signed: 06/30/2023 6:28:11 PM By: [name] RN Entered By: Claven [name] on 06/30/2023 08:41:22 -------------------------------------------------------------------------------- Multi Wound Chart  Details Patient Name: Date of Service: [name], [name] 06/30/2023 8:00 A M Medical Record Number: [id number] Patient Account Number: [id number] Date of Birth/Sex: Treating RN: 1932-03-19 (89 y.o. F) Primary Care Eyoel Throgmorton: [name] Other Clinician: Vorhees, Kylieann ([id number]) [id number] Page 4 of 9 Referring Kenli Waldo: Treating Javarious Elsayed/Extender: [name] [name] [name] in Treatment: 0 Vital Signs Height(in): 59 Pulse(bpm): 74 Weight(lbs): 146 Blood Pressure(mmHg): 147/53 Body Mass Index(BMI): 29.5 Temperature(F): 98 Respiratory Rate(breaths/min): 18 [5:Photos:] [N/A:N/A] Right, Anterior Lower Leg Left, Anterior Lower Leg N/A Wound Location: Gradually Appeared Gradually Appeared N/A Wounding Event: Venous Leg Ulcer Venous Leg Ulcer N/A Primary Etiology: Asthma, Hypertension, Vasculitis Asthma, Hypertension, Vasculitis N/A Comorbid History: 06/22/2023 06/22/2023 N/A Date Acquired: 0 0 N/A Weeks of Treatment: Open Open N/A Wound Status: No No N/A Wound Recurrence: 1x0.4x0.1 3x4x0.1 N/A Measurements L x W x D (cm) 0.314 9.425 N/A A (cm) : rea 0.031 0.942 N/A Volume (cm) : Full Thickness Without Exposed Full Thickness Without Exposed N/A Classification: Support Structures Support Structures Medium Medium N/A Exudate A mount: Serosanguineous Serosanguineous N/A Exudate Type: red, brown red, brown N/A Exudate Color: Flat and Intact Flat and Intact N/A Wound Margin: Large (67-100%) Large (67-100%) N/A Granulation Amount: Red Red N/A Granulation Quality: Small (1-33%) Small (1-33%) N/A Necrotic Amount: Eschar, Adherent Slough Eschar, Adherent Slough N/A Necrotic Tissue: Fat Layer (Subcutaneous Tissue): Yes Fat Layer (Subcutaneous Tissue): Yes N/A Exposed Structures: Fascia: No Tendon: No Muscle: No Joint: No Bone: No Large (67-100%) Large (67-100%) N/A Epithelialization: Scarring: Yes Scarring: Yes N/A Periwound Skin  Texture: No Abnormalities Noted No Abnormalities Noted N/A Periwound Skin Moisture: Rubor: Yes Rubor: Yes N/A Periwound Skin Color: N/A No Abnormality N/A Temperature: Compression Therapy Compression Therapy N/A Procedures Performed: Treatment Notes Electronic Signature(s) Signed: 07/05/2023 12:04:19 PM By: [name] DO Entered By: [name] on 06/30/2023 09:22:34 -------------------------------------------------------------------------------- Multi-Disciplinary Care Plan Details Patient Name: Date of Service: [name], [name] 06/30/2023 8:00 A M Medical Record Number: [id number] Patient Account Number: [id number] Date of Birth/Sex: Treating RN: 1931-07-23 (89 y.o. [name] Primary Care Marlen Koman: [name] Other Clinician: Referring Pattye Meda: Treating Tashae Inda/Extender: [name] [name]: 0 Louth, Shawnay ([id number]) [id number] Page 5 of 9 Active Inactive Wound/Skin Impairment Nursing Diagnoses: Impaired tissue integrity Goals: Patient/caregiver will verbalize understanding of skin care regimen Date Initiated: 06/30/2023 Target Resolution Date: 08/19/2023 Goal Status: Active Interventions: Assess ulceration(s) every visit Treatment Activities: Skin care regimen initiated : 06/30/2023 Notes: Electronic Signature(s) Signed: 06/30/2023 6:28:11 PM By: [name] RN Entered By: Claven [name] on 06/30/2023 12:06:50 -------------------------------------------------------------------------------- Pain Assessment Details Patient Name: Date of Service: [name], [name] 06/30/2023 8:00 A M Medical Record Number: [id number] Patient Account Number: [id number] Date of Birth/Sex: Treating RN: [birth year] (89 y.o. [name] Primary Care Minal Stuller: [name] Other Clinician: Referring  Ronin Rehfeldt: Treating Malaki Koury/Extender: [name] Jodie Lavern Weeks in Treatment: 0 Active  Problems Location of Pain Severity and Description of Pain Patient Has Paino No Site Locations Pain Management and Medication Current Pain Management: Electronic Signature(s) Signed: 06/30/2023 6:28:11 PM By: [name] RN Entered By: Claven [name] on 06/30/2023 08:53:50 [name], [name] ([id number]) [id number] Page 6 of 9 -------------------------------------------------------------------------------- Patient/Caregiver Education Details Patient Name: Date of Service: PA AUDREE, SCHRECENGOST 1/9/2025andnbsp8:00 A M Medical Record Number: [id number] Patient Account Number: [id number] Date of Birth/Gender: Treating RN: 1931/09/28 (89 y.o. [name] Primary Care Physician: Jodie Lavern Other Clinician: Referring Physician: Treating Physician/Extender: [name] Jodie Lavern [name] in Treatment: 0 Education Assessment Education Provided To: Patient Education Topics Provided Wound/Skin Impairment: Methods: Explain/Verbal Responses: State content correctly Electronic Signature(s) Signed: 06/30/2023 6:28:11 PM By: [name] RN Entered By: Claven [name] on 06/30/2023 12:07:01 -------------------------------------------------------------------------------- Wound Assessment Details Patient Name: Date of Service: [name], [name] 06/30/2023 8:00 A M Medical Record Number: [id number] Patient Account Number: [id number] Date of Birth/Sex: Treating RN: 17-Jun-1932 (89 y.o. [name] Claven, [name] Primary Care Zeniya Lapidus: Jodie Lavern Other Clinician: Referring Edras Wilford: Treating Hubert Derstine/Extender: [name] Jodie Lavern Weeks in Treatment: 0 Wound Status Wound Number: 5 Primary Etiology: Venous Leg Ulcer Wound Location: Right, Anterior Lower Leg Wound Status: Open Wounding Event: Gradually Appeared Comorbid History: Asthma, Hypertension, Vasculitis Date Acquired: 06/22/2023 Weeks Of Treatment: 0 Clustered Wound: No Photos Wound  Measurements Bellemare, Elenor ([id number]) Length: (cm) 1 Width: (cm) 0.4 Depth: (cm) 0.1 Area: (cm) 0.314 Volume: (cm) 0.031 [id number] Page 7 of 9 % Reduction in Area: % Reduction in Volume: Epithelialization: Large (67-100%) Tunneling: No Undermining: No Wound Description Classification: Full Thickness Without Exposed Support Structures Wound Margin: Flat and Intact Exudate Amount: Medium Exudate Type: Serosanguineous Exudate Color: red, brown Foul Odor After Cleansing: No Slough/Fibrino Yes Wound Bed Granulation Amount: Large (67-100%) Exposed Structure Granulation Quality: Red Fat Layer (Subcutaneous Tissue) Exposed: Yes Necrotic Amount: Small (1-33%) Necrotic Quality: Eschar, Adherent Slough Periwound Skin Texture Texture Color No Abnormalities Noted: No No Abnormalities Noted: No Scarring: Yes Rubor: Yes Moisture No Abnormalities Noted: Yes Treatment Notes Wound #5 (Lower Leg) Wound Laterality: Right, Anterior Cleanser Soap and Water  Discharge Instruction: In clinic after Leg Wraps are removed- wash wound with dial antibacterial soap and water  prior to dressing change. Vashe 5.8 (oz) Discharge Instruction: In Clinic-Cleanse the wound with Vashe prior to applying a clean dressing using gauze sponges, not tissue or cotton balls. Peri-Wound Care Triamcinolone  15 (g) Discharge Instruction: Use triamcinolone  15 (g) as directed Topical Gentamicin  Discharge Instruction: As directed by physician Mupirocin  Ointment Discharge Instruction: Apply Mupirocin  (Bactroban ) as instructed Primary Dressing Secondary Dressing Woven Gauze Sponge, Non-Sterile 4x4 in Discharge Instruction: Apply over primary dressing as directed. Secured With Transpore Surgical Tape, 2x10 (in/yd) Discharge Instruction: Secure dressing with tape as directed. Compression Wrap Kerlix Roll 4.5x3.1 (in/yd) Discharge Instruction: Apply Kerlix and Coban compression as  directed. Coban Self-Adherent Wrap 4x5 (in/yd) Discharge Instruction: Apply over Kerlix as directed. Unnaboot w/Calamine, 4x10 (in/yd) Discharge Instruction: Apply Unnaboot as directed. Tubular Netting #5 Compression Stockings Add-Ons Electronic Signature(s) Signed: 06/30/2023 6:28:11 PM By: [name] RN Entered By: Claven [name] on 06/30/2023 08:53:01 [name], [name] ([id number]) [id number] Page 8 of 9 -------------------------------------------------------------------------------- Wound Assessment Details Patient Name: Date of Service: PA [name], [name] 06/30/2023 8:00 A M Medical Record Number: [id number] Patient Account Number: [id number] Date of Birth/Sex: Treating RN: 03-24-1932 (89 y.o. [name] Primary Care Aleta Manternach: Jodie Lavern Other Clinician: Referring Timiyah Romito: Treating Edwin Baines/Extender: [name]  [name]: 0 Wound Status Wound Number: 6 Primary Etiology: Venous Leg Ulcer Wound Location: Left, Anterior Lower Leg Wound Status: Open Wounding Event: Gradually Appeared Comorbid History: Asthma, Hypertension, Vasculitis Date Acquired: 06/22/2023 Weeks Of Treatment: 0 Clustered Wound: No Photos Wound Measurements Length: (cm) 3 Width: (cm) 4 Depth: (cm) 0.1 Area: (cm) 9.425 Volume: (cm) 0.942 % Reduction in Area: % Reduction in Volume: Epithelialization: Large (67-100%) Tunneling: No Undermining: No Wound Description Classification: Full Thickness Without Exposed Suppor Wound Margin: Flat and Intact Exudate Amount: Medium Exudate Type: Serosanguineous Exudate Color: red, brown t Structures Foul Odor After Cleansing: No Slough/Fibrino Yes Wound Bed Granulation Amount: Large (67-100%) Exposed Structure Granulation Quality: Red Fascia Exposed: No Necrotic Amount: Small (1-33%) Fat Layer (Subcutaneous Tissue) Exposed: Yes Necrotic Quality: Eschar, Adherent Slough Tendon Exposed: No Muscle  Exposed: No Joint Exposed: No Bone Exposed: No Periwound Skin Texture Texture Color No Abnormalities Noted: No No Abnormalities Noted: No Scarring: Yes Rubor: Yes Moisture Temperature / Pain No Abnormalities Noted: Yes Temperature: No Abnormality Treatment Notes Wound #6 (Lower Leg) Wound Laterality: Left, Anterior Dunaway, Anny ([id number]) [id number] Page 9 of 9 Cleanser Soap and Water  Discharge Instruction: In clinic after Leg Wraps are removed- wash wound with dial antibacterial soap and water  prior to dressing change. Vashe 5.8 (oz) Discharge Instruction: In Clinic-Cleanse the wound with Vashe prior to applying a clean dressing using gauze sponges, not tissue or cotton balls. Peri-Wound Care Triamcinolone  15 (g) Discharge Instruction: Use triamcinolone  15 (g) as directed Topical Gentamicin  Discharge Instruction: As directed by physician Mupirocin  Ointment Discharge Instruction: Apply Mupirocin  (Bactroban ) as instructed Primary Dressing Secondary Dressing Woven Gauze Sponge, Non-Sterile 4x4 in Discharge Instruction: Apply over primary dressing as directed. Secured With Transpore Surgical Tape, 2x10 (in/yd) Discharge Instruction: Secure dressing with tape as directed. Compression Wrap Kerlix Roll 4.5x3.1 (in/yd) Discharge Instruction: Apply Kerlix and Coban compression as directed. Coban Self-Adherent Wrap 4x5 (in/yd) Discharge Instruction: Apply over Kerlix as directed. Unnaboot w/Calamine, 4x10 (in/yd) Discharge Instruction: Apply Unnaboot as directed. Tubular Netting #5 Compression Stockings Add-Ons Electronic Signature(s) Signed: 06/30/2023 6:28:11 PM By: [name] RN Entered By: Claven [name] on 06/30/2023 08:53:27 -------------------------------------------------------------------------------- Vitals Details Patient Name: Date of Service: [name], Neri 06/30/2023 8:00 A M Medical Record Number: [id number] Patient Account  Number: [id number] Date of Birth/Sex: Treating RN: 01-May-1932 (89 y.o. [name] Primary Care Daylani Deblois: [name] Other Clinician: Referring Cedrik Heindl: Treating Cadynce Garrette/Extender: [name] [name]: 0 Vital Signs Time Taken: 08:12 Temperature (F): 98 Height (in): 59 Pulse (bpm): 74 Weight (lbs): 146 Respiratory Rate (breaths/min): 18 Body Mass Index (BMI): 29.5 Blood Pressure (mmHg): 147/53 Reference Range: 80 - 120 mg / dl Electronic Signature(s) Signed: 06/30/2023 6:28:11 PM By: [name] RN Entered By: Claven [name] on 06/30/2023 08:14:33

## 2023-07-06 NOTE — Progress Notes (Signed)
 [name], [name] ([id number]) [id number] Page 1 of 10 Visit Report for 06/30/2023 Chief Complaint Document Details Patient Name: Date of Service: PA [name] 06/30/2023 8:00 A M Medical Record Number: [id number] Patient Account Number: [id number] Date of Birth/Sex: Treating RN: 1932/06/04 (89 y.o. F) Primary Care Provider: Jodie [name] Other Clinician: Referring Provider: Treating Provider/Extender: [name] [name] [name] in Treatment: 0 Information Obtained from: Patient Chief Complaint 06/30/2023; patient presents for scattered open wounds limited to skin breakdown in the setting of lymphedema Electronic Signature(s) Signed: 07/05/2023 12:04:19 PM By: [name] Harlene DO Entered By: [name] on 06/30/2023 09:23:04 -------------------------------------------------------------------------------- HPI Details Patient Name: Date of Service: [name], [name] 06/30/2023 8:00 A M Medical Record Number: [id number] Patient Account Number: [id number] Date of Birth/Sex: Treating RN: 04-Jun-1932 (89 y.o. F) Primary Care Provider: Jodie [name] Other Clinician: Referring Provider: Treating Provider/Extender: [name] [name] [name] in Treatment: 0 History of Present Illness HPI Description: ADMISSION 11/04/2022 This is an 89 year old woman with a past medical history notable for congestive heart failure, complete heart block status post pacemaker placement, impaired glucose tolerance, hypertension, chronic venous insufficiency and bilateral lower extremity edema, CKD stage IIIa, and hypothyroidism. She has struggled with worsening lower extremity edema and recently had her diuretic change from furosemide  to torsemide . She was treated in April for cellulitis with a 10-day course of cephalexin . She apparently had a similar issue in December of this past year which responded to [employer] and doxycycline . Compression stockings and leg elevation  were recommended. She was also referred to the wound center due to what was described in the electronic medical record and as thickened eschar and scabbed bullae. ABIs in clinic today was 0.99 on the right and 0.92 on the left. She does not wear compression stockings because she finds them constricting and uncomfortable. 11/19/2022: There is a tiny residual opening on the right leg, but everything else is healed. 11/22/2022: Because of the way the [facility] wraps were ordered (only one ordered despite bilateral wounds on initial intake), we ended up having to apply an Unna boot on the left leg and the Farrow wrap on the right. For some reason, the patient had a significant reaction to the stocking and experienced significant itching and developed blisters on her leg. They called to make an urgent appointment today to have this evaluated. I suspect she may have an allergy  to what ever is on the stockings straight from the factory. 6/7; the patient to use has venous and lymphedema. She had wounds on both legs that are technically healed. She was ordered Farrow wraps for swelling control. She developed a fairly angry rash on the right leg in all likelihood contact dermatitis from the stocking layer of the Farrow wrap. We therefore wrapped the right leg. She returns today with this looking quite a bit better. Her son is purchased a stocking for the left leg. We are going to order some form of compression stocking for both legs use the new stocking as the inner sleeve for her Farrow wrap. 12/03/2022: The wounds are healed. A pair of juxta lite stockings was ordered and overnighted to her; they should arrive today. 06/30/2023 [name] is an 89 year old female with a past medical history of CHF, complete heart block status post pacemaker placement, venous insufficiency/lymphedema that presents to the clinic for scattered open wounds to her lower extremities bilaterally. She states that over the past couple  weeks she has had excessive itching to her legs and when scratching them has  developed better open wounds. She reports minimal weeping. She does have compression stockings and has been wearing them. She currently denies signs of infection including increased warmth, erythema or purulent drainage. She denies pain. She denies fever/chills. She feels overall herself. [name], [name] ([id number]) [id number] Page 2 of 10 Electronic Signature(s) Signed: 07/05/2023 12:04:19 PM By: [name] Raisin DO Entered By: [name] on 06/30/2023 09:24:24 -------------------------------------------------------------------------------- Physical Exam Details Patient Name: Date of Service: [name], [name] 06/30/2023 8:00 A M Medical Record Number: [id number] Patient Account Number: [id number] Date of Birth/Sex: Treating RN: 06/22/1931 (89 y.o. F) Primary Care Provider: Jodie [name] Other Clinician: Referring Provider: Treating Provider/Extender: [name] [name] Weeks in Treatment: 0 Constitutional respirations regular, non-labored and within target range for patient.. Cardiovascular 2+ dorsalis pedis/posterior tibialis pulses. Psychiatric pleasant and cooperative. Notes Scattered open areas to her legs bilaterally limited to skin breakdown with dried scabs. No weeping noted. Nonpitting edema. Lymphedema skin changes noted. Electronic Signature(s) Signed: 07/05/2023 12:04:19 PM By: [name] Raisin DO Entered By: [name] on 06/30/2023 09:25:03 -------------------------------------------------------------------------------- Physician Orders Details Patient Name: Date of Service: [name], [name] 06/30/2023 8:00 A M Medical Record Number: [id number] Patient Account Number: [id number] Date of Birth/Sex: Treating RN: Dec 30, 1931 (89 y.o. [name] [name] Primary Care Provider: Jodie [name] Other Clinician: Referring Provider: Treating Provider/Extender:  [name] [name] Weeks in Treatment: 0 Verbal / Phone Orders: No Diagnosis Coding ICD-10 Coding Code Description L97.811 Non-pressure chronic ulcer of other part of right lower leg limited to breakdown of skin L97.821 Non-pressure chronic ulcer of other part of left lower leg limited to breakdown of skin I89.0 Lymphedema, not elsewhere classified I87.311 Chronic venous hypertension (idiopathic) with ulcer of right lower extremity I87.312 Chronic venous hypertension (idiopathic) with ulcer of left lower extremity Follow-up Appointments ppointment in 1 week. - Dr. Rosan Return A Anesthetic (In clinic) Topical Lidocaine  4% applied to wound bed Bathing/ Shower/ Hygiene [place], [place] ([id number]) [id number] Page 3 of 10 May shower with protection but do not get wound dressing(s) wet. Protect dressing(s) with water  repellant cover (for example, large plastic bag) or a cast cover and may then take shower. - Please keep legs dry until next appointment at the Wound Care Center Other Bathing/Shower/Hygiene Orders/Instructions: - If not able to keep Leg wraps dry please just sponge bathe. Edema Control - Orders / Instructions Bilateral Lower Extremities Elevate legs to the level of the heart or above for 30 minutes daily and/or when sitting for 3-4 times a day throughout the day. - When sitting Avoid standing for long periods of time. Patient to wear own compression stockings every day. - When not in leg wraps wear compression stockings Exercise regularly - As tolerated Moisturize legs daily. - Before applying the compression stockings- When leg wraps NOT on the legs Additional Orders / Instructions Wound #5 Right,Anterior Lower Leg Follow Nutritious Diet Wound #6 Left,Anterior Lower Leg Follow Nutritious Diet Wound Treatment Wound #5 - Lower Leg Wound Laterality: Right, Anterior Cleanser: Soap and Water  1 x Per Week/30 Days Discharge  Instructions: In clinic after Leg Wraps are removed- wash wound with dial antibacterial soap and water  prior to dressing change. Cleanser: Vashe 5.8 (oz) 1 x Per Week/30 Days Discharge Instructions: In Clinic-Cleanse the wound with Vashe prior to applying a clean dressing using gauze sponges, not tissue or cotton balls. Peri-Wound Care: Triamcinolone  15 (g) 1 x Per Week/30 Days Discharge Instructions: Use triamcinolone  15 (g) as directed Topical: Gentamicin  1 x Per Week/30 Days Discharge Instructions: As  directed by physician Topical: Mupirocin  Ointment 1 x Per Week/30 Days Discharge Instructions: Apply Mupirocin  (Bactroban ) as instructed Secondary Dressing: Woven Gauze Sponge, Non-Sterile 4x4 in 1 x Per Week/30 Days Discharge Instructions: Apply over primary dressing as directed. Secured With: Transpore Surgical Tape, 2x10 (in/yd) 1 x Per Week/30 Days Discharge Instructions: Secure dressing with tape as directed. Compression Wrap: Kerlix Roll 4.5x3.1 (in/yd) 1 x Per Week/30 Days Discharge Instructions: Apply Kerlix and Coban compression as directed. Compression Wrap: Coban Self-Adherent Wrap 4x5 (in/yd) 1 x Per Week/30 Days Discharge Instructions: Apply over Kerlix as directed. Compression Wrap: Unnaboot w/Calamine, 4x10 (in/yd) 1 x Per Week/30 Days Discharge Instructions: Apply Unnaboot as directed. Compression Wrap: Tubular Netting #5 1 x Per Week/30 Days Wound #6 - Lower Leg Wound Laterality: Left, Anterior Cleanser: Soap and Water  1 x Per Week/30 Days Discharge Instructions: In clinic after Leg Wraps are removed- wash wound with dial antibacterial soap and water  prior to dressing change. Cleanser: Vashe 5.8 (oz) 1 x Per Week/30 Days Discharge Instructions: In Clinic-Cleanse the wound with Vashe prior to applying a clean dressing using gauze sponges, not tissue or cotton balls. Peri-Wound Care: Triamcinolone  15 (g) 1 x Per Week/30 Days Discharge Instructions: Use triamcinolone  15 (g) as  directed Topical: Gentamicin  1 x Per Week/30 Days Discharge Instructions: As directed by physician Topical: Mupirocin  Ointment 1 x Per Week/30 Days Discharge Instructions: Apply Mupirocin  (Bactroban ) as instructed Secondary Dressing: Woven Gauze Sponge, Non-Sterile 4x4 in 1 x Per Week/30 Days Discharge Instructions: Apply over primary dressing as directed. Secured With: Transpore Surgical Tape, 2x10 (in/yd) 1 x Per Week/30 Days Discharge Instructions: Secure dressing with tape as directed. Compression Wrap: Kerlix Roll 4.5x3.1 (in/yd) 1 x Per Week/30 Days Discharge Instructions: Apply Kerlix and Coban compression as directed. [name], [name] ([id number]) [id number] Page 4 of 10 Compression Wrap: Coban Self-Adherent Wrap 4x5 (in/yd) 1 x Per Week/30 Days Discharge Instructions: Apply over Kerlix as directed. Compression Wrap: Unnaboot w/Calamine, 4x10 (in/yd) 1 x Per Week/30 Days Discharge Instructions: Apply Unnaboot as directed. Compression Wrap: Tubular Netting #5 1 x Per Week/30 Days Patient Medications llergies: fluticasone , sulfa antibiotics, tetracycline, benzonatate , erythromycin base, meloxicam A Notifications Medication Indication Start End 06/30/2023 triamcinolone  acetonide DOSE 1 - topical 0.1 % cream - Apply once daily as needed to areas of skin irritation/itching Electronic Signature(s) Signed: 07/05/2023 12:04:19 PM By: [name] Raisin DO Previous Signature: 06/30/2023 9:31:05 AM Version By: [name] Raisin DO Entered By: [name] on 06/30/2023 09:31:18 -------------------------------------------------------------------------------- Problem List Details Patient Name: Date of Service: [name], Ceazia 06/30/2023 8:00 A M Medical Record Number: [id number] Patient Account Number: [id number] Date of Birth/Sex: Treating RN: 1931/09/13 (89 y.o. F) Primary Care Provider: Jodie [name] Other Clinician: Referring Provider: Treating Provider/Extender:  [name] [name] [name] in Treatment: 0 Active Problems ICD-10 Encounter Code Description Active Date MDM Diagnosis L97.811 Non-pressure chronic ulcer of other part of right lower leg limited to breakdown 06/30/2023 No Yes of skin L97.821 Non-pressure chronic ulcer of other part of left lower leg limited to breakdown 06/30/2023 No Yes of skin I89.0 Lymphedema, not elsewhere classified 06/30/2023 No Yes I87.311 Chronic venous hypertension (idiopathic) with ulcer of right lower extremity 06/30/2023 No Yes I87.312 Chronic venous hypertension (idiopathic) with ulcer of left lower extremity 06/30/2023 No Yes Inactive Problems Resolved Problems Electronic Signature(s) [name], [name] ([id number]) [id number] Page 5 of 10 Signed: 07/05/2023 12:04:19 PM By: [name] Raisin DO Entered By: [name] on 06/30/2023 09:25:43 -------------------------------------------------------------------------------- Progress Note Details Patient Name: Date of Service: [name], Amirra  06/30/2023 8:00 A M Medical Record Number: [id number] Patient Account Number: [id number] Date of Birth/Sex: Treating RN: 1931-10-17 (89 y.o. F) Primary Care Provider: Jodie [name] Other Clinician: Referring Provider: Treating Provider/Extender: [name] [name] [name] in Treatment: 0 Subjective Chief Complaint Information obtained from Patient 06/30/2023; patient presents for scattered open wounds limited to skin breakdown in the setting of lymphedema History of Present Illness (HPI) ADMISSION 11/04/2022 This is an 89 year old woman with a past medical history notable for congestive heart failure, complete heart block status post pacemaker placement, impaired glucose tolerance, hypertension, chronic venous insufficiency and bilateral lower extremity edema, CKD stage IIIa, and hypothyroidism. She has struggled with worsening lower extremity edema and recently had her diuretic  change from furosemide  to torsemide . She was treated in April for cellulitis with a 10-day course of cephalexin . She apparently had a similar issue in December of this past year which responded to [employer] and doxycycline . Compression stockings and leg elevation were recommended. She was also referred to the wound center due to what was described in the electronic medical record and as thickened eschar and scabbed bullae. ABIs in clinic today was 0.99 on the right and 0.92 on the left. She does not wear compression stockings because she finds them constricting and uncomfortable. 11/19/2022: There is a tiny residual opening on the right leg, but everything else is healed. 11/22/2022: Because of the way the [facility] wraps were ordered (only one ordered despite bilateral wounds on initial intake), we ended up having to apply an Unna boot on the left leg and the Farrow wrap on the right. For some reason, the patient had a significant reaction to the stocking and experienced significant itching and developed blisters on her leg. They called to make an urgent appointment today to have this evaluated. I suspect she may have an allergy  to what ever is on the stockings straight from the factory. 6/7; the patient to use has venous and lymphedema. She had wounds on both legs that are technically healed. She was ordered Farrow wraps for swelling control. She developed a fairly angry rash on the right leg in all likelihood contact dermatitis from the stocking layer of the Farrow wrap. We therefore wrapped the right leg. She returns today with this looking quite a bit better. Her son is purchased a stocking for the left leg. We are going to order some form of compression stocking for both legs use the new stocking as the inner sleeve for her Farrow wrap. 12/03/2022: The wounds are healed. A pair of juxta lite stockings was ordered and overnighted to her; they should arrive today. 06/30/2023 Ms. Ariona Deschene is an  89 year old female with a past medical history of CHF, complete heart block status post pacemaker placement, venous insufficiency/lymphedema that presents to the clinic for scattered open wounds to her lower extremities bilaterally. She states that over the past couple weeks she has had excessive itching to her legs and when scratching them has developed better open wounds. She reports minimal weeping. She does have compression stockings and has been wearing them. She currently denies signs of infection including increased warmth, erythema or purulent drainage. She denies pain. She denies fever/chills. She feels overall herself. . Patient History Information obtained from Patient, Chart. Allergies fluticasone , sulfa antibiotics, tetracycline, benzonatate , erythromycin base, meloxicam Family History Cancer - Father, Stroke - Mother,Siblings. Social History Never smoker, Marital Status - Widowed, Alcohol Use - Never, Drug Use - No History, Caffeine Use - Never. Medical History Respiratory Patient has  history of Asthma Cardiovascular Patient has history of Hypertension, Vasculitis - venous insufficiency Medical A Surgical History Notes nd Eyes Cataract surgery. 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 Cardiovascular Pacemaker (over 15 yrs) Endocrine Hypothyroidism Last Hem A1C was 6.2 (01/03/23)-denies being Diabetic Genitourinary Pettway, Julene ([id number]) [id number] Page 6 of 10 Stage 3a CKD Integumentary (Skin) Hx: Cellulitis-bilateral legs Musculoskeletal Osteopenia Psychiatric Hx: Depression; Insomnia Review of Systems (ROS) Constitutional Symptoms (General Health) Denies complaints or symptoms of Fatigue, Fever, Chills, Marked Weight Change. Endocrine Denies complaints or symptoms of Heat/cold intolerance. General Notes: Pacemaker-implant Objective Constitutional respirations regular, non-labored and within target range for  patient.. Vitals Time Taken: 8:12 AM, Height: 59 in, Weight: 146 lbs, BMI: 29.5, Temperature: 98 F, Pulse: 74 bpm, Respiratory Rate: 18 breaths/min, Blood Pressure: 147/53 mmHg. Cardiovascular 2+ dorsalis pedis/posterior tibialis pulses. Psychiatric pleasant and cooperative. General Notes: Scattered open areas to her legs bilaterally limited to skin breakdown with dried scabs. No weeping noted. Nonpitting edema. Lymphedema skin changes noted. Integumentary (Hair, Skin) Wound #5 status is Open. Original cause of wound was Gradually Appeared. The date acquired was: 06/22/2023. The wound is located on the Right,Anterior Lower Leg. The wound measures 1cm length x 0.4cm width x 0.1cm depth; 0.314cm^2 area and 0.031cm^3 volume. There is Fat Layer (Subcutaneous Tissue) exposed. There is no tunneling or undermining noted. There is a medium amount of serosanguineous drainage noted. The wound margin is flat and intact. There is large (67-100%) red granulation within the wound bed. There is a small (1-33%) amount of necrotic tissue within the wound bed including Eschar and Adherent Slough. The periwound skin appearance had no abnormalities noted for moisture. The periwound skin appearance exhibited: Scarring, Rubor. Wound #6 status is Open. Original cause of wound was Gradually Appeared. The date acquired was: 06/22/2023. The wound is located on the Left,Anterior Lower Leg. The wound measures 3cm length x 4cm width x 0.1cm depth; 9.425cm^2 area and 0.942cm^3 volume. There is Fat Layer (Subcutaneous Tissue) exposed. There is no tunneling or undermining noted. There is a medium amount of serosanguineous drainage noted. The wound margin is flat and intact. There is large (67-100%) red granulation within the wound bed. There is a small (1-33%) amount of necrotic tissue within the wound bed including Eschar and Adherent Slough. The periwound skin appearance had no abnormalities noted for moisture. The periwound skin  appearance exhibited: Scarring, Rubor. Periwound temperature was noted as No Abnormality. Assessment Active Problems ICD-10 Non-pressure chronic ulcer of other part of right lower leg limited to breakdown of skin Non-pressure chronic ulcer of other part of left lower leg limited to breakdown of skin Lymphedema, not elsewhere classified Chronic venous hypertension (idiopathic) with ulcer of right lower extremity Chronic venous hypertension (idiopathic) with ulcer of left lower extremity Patient presents with a 1 to 2-week history of scattered open areas to her lower extremities bilaterally in the setting of chronic venous insufficiency/lymphedema. She has been scratching her legs due to increased itching. She has dry skin likely contributed by the winter weather. No signs of infection on exam. At this time I recommended steroid cream along with antibiotic ointment under compression therapy. She has done well with Unna boots in the past so we will continue this. ABIs are normal. She knows to not get the wraps wet or keep them on for more than 1 week. I will also prescribe her triamcinolone  cream to use after the we are done using an office wraps and she can use this under her  compression stockings daily for when she has itching. Procedures Wound #5 Pre-procedure diagnosis of Wound #5 is a Venous Leg Ulcer located on the Right,Anterior Lower Leg . There was a [employer] Broad[employer] Assistant Compression Therapy Procedure by [name] Pollen, RN. Post procedure Diagnosis Wound #5: Same as Pre-Procedure Tax, Suly ([id number]) [id number] Page 7 of 10 Wound #6 Pre-procedure diagnosis of Wound #6 is a Venous Leg Ulcer located on the Left,Anterior Lower Leg . There was a Radio Broadcast Assistant Compression Therapy Procedure by [name] Pollen, RN. Post procedure Diagnosis Wound #6: Same as Pre-Procedure Plan Follow-up Appointments: Return Appointment in 1 week. - Dr. Rosan Anesthetic: (In clinic)  Topical Lidocaine  4% applied to wound bed Bathing/ Shower/ Hygiene: May shower with protection but do not get wound dressing(s) wet. Protect dressing(s) with water  repellant cover (for example, large plastic bag) or a cast cover and may then take shower. - Please keep legs dry until next appointment at the Wound Care Center Other Bathing/Shower/Hygiene Orders/Instructions: - If not able to keep Leg wraps dry please just sponge bathe. Edema Control - Orders / Instructions: Elevate legs to the level of the heart or above for 30 minutes daily and/or when sitting for 3-4 times a day throughout the day. - When sitting Avoid standing for long periods of time. Patient to wear own compression stockings every day. - When not in leg wraps wear compression stockings Exercise regularly - As tolerated Moisturize legs daily. - Before applying the compression stockings- When leg wraps NOT on the legs Additional Orders / Instructions: Wound #5 Right,Anterior Lower Leg: Follow Nutritious Diet Wound #6 Left,Anterior Lower Leg: Follow Nutritious Diet The following medication(s) was prescribed: triamcinolone  acetonide topical 0.1 % cream 1 Apply once daily as needed to areas of skin irritation/itching starting 06/30/2023 WOUND #5: - Lower Leg Wound Laterality: Right, Anterior Cleanser: Soap and Water  1 x Per Week/30 Days Discharge Instructions: In clinic after Leg Wraps are removed- wash wound with dial antibacterial soap and water  prior to dressing change. Cleanser: Vashe 5.8 (oz) 1 x Per Week/30 Days Discharge Instructions: In Clinic-Cleanse the wound with Vashe prior to applying a clean dressing using gauze sponges, not tissue or cotton balls. Peri-Wound Care: Triamcinolone  15 (g) 1 x Per Week/30 Days Discharge Instructions: Use triamcinolone  15 (g) as directed Topical: Gentamicin  1 x Per Week/30 Days Discharge Instructions: As directed by physician Topical: Mupirocin  Ointment 1 x Per Week/30 Days Discharge  Instructions: Apply Mupirocin  (Bactroban ) as instructed Secondary Dressing: Woven Gauze Sponge, Non-Sterile 4x4 in 1 x Per Week/30 Days Discharge Instructions: Apply over primary dressing as directed. Secured With: Transpore Surgical T ape, 2x10 (in/yd) 1 x Per Week/30 Days Discharge Instructions: Secure dressing with tape as directed. Com pression Wrap: Kerlix Roll 4.5x3.1 (in/yd) 1 x Per Week/30 Days Discharge Instructions: Apply Kerlix and Coban compression as directed. Com pression Wrap: Coban Self-Adherent Wrap 4x5 (in/yd) 1 x Per Week/30 Days Discharge Instructions: Apply over Kerlix as directed. Com pression Wrap: Unnaboot w/Calamine, 4x10 (in/yd) 1 x Per Week/30 Days Discharge Instructions: Apply Unnaboot as directed. Com pression Wrap: Tubular Netting #5 1 x Per Week/30 Days WOUND #6: - Lower Leg Wound Laterality: Left, Anterior Cleanser: Soap and Water  1 x Per Week/30 Days Discharge Instructions: In clinic after Leg Wraps are removed- wash wound with dial antibacterial soap and water  prior to dressing change. Cleanser: Vashe 5.8 (oz) 1 x Per Week/30 Days Discharge Instructions: In Clinic-Cleanse the wound with Vashe prior to applying a clean dressing using gauze sponges, not tissue or  cotton balls. Peri-Wound Care: Triamcinolone  15 (g) 1 x Per Week/30 Days Discharge Instructions: Use triamcinolone  15 (g) as directed Topical: Gentamicin  1 x Per Week/30 Days Discharge Instructions: As directed by physician Topical: Mupirocin  Ointment 1 x Per Week/30 Days Discharge Instructions: Apply Mupirocin  (Bactroban ) as instructed Secondary Dressing: Woven Gauze Sponge, Non-Sterile 4x4 in 1 x Per Week/30 Days Discharge Instructions: Apply over primary dressing as directed. Secured With: Transpore Surgical T ape, 2x10 (in/yd) 1 x Per Week/30 Days Discharge Instructions: Secure dressing with tape as directed. Com pression Wrap: Kerlix Roll 4.5x3.1 (in/yd) 1 x Per Week/30 Days Discharge  Instructions: Apply Kerlix and Coban compression as directed. Com pression Wrap: Coban Self-Adherent Wrap 4x5 (in/yd) 1 x Per Week/30 Days Discharge Instructions: Apply over Kerlix as directed. Com pression Wrap: Unnaboot w/Calamine, 4x10 (in/yd) 1 x Per Week/30 Days Discharge Instructions: Apply Unnaboot as directed. Com pression Wrap: Tubular Netting #5 1 x Per Week/30 Days 1. TCA and antibiotic ointment under Unna boots to lower extremities bilaterally 2. Follow-up in 1 week Electronic Signature(s) Signed: 07/05/2023 12:04:19 PM By: [name] Raisin DO [name], [name] ([id number]) [id number] Page 8 of 10 Entered By: [name] on 06/30/2023 09:31:28 -------------------------------------------------------------------------------- HxROS Details Patient Name: Date of Service: [name], Aliviana 06/30/2023 8:00 A M Medical Record Number: [id number] Patient Account Number: [id number] Date of Birth/Sex: Treating RN: [birth year] (89 y.o. [name] [name] Primary Care Provider: Jodie [name] Other Clinician: Referring Provider: Treating Provider/Extender: [name] [name] Weeks in Treatment: 0 Information Obtained From Patient Chart Constitutional Symptoms (General Health) Complaints and Symptoms: Negative for: Fatigue; Fever; Chills; Marked Weight Change Endocrine Complaints and Symptoms: Negative for: Heat/cold intolerance Medical History: Past Medical History Notes: Hypothyroidism Last Hem A1C was 6.2 (01/03/23)-denies being Diabetic Eyes Medical History: Past Medical History Notes: Cataract surgery. 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 Respiratory Medical History: Positive for: Asthma Cardiovascular Medical History: Positive for: Hypertension; Vasculitis - venous insufficiency Past Medical History Notes: Pacemaker (over 15 yrs) Genitourinary Medical History: Past Medical History Notes: Stage 3a  CKD Integumentary (Skin) Medical History: Past Medical History Notes: Hx: Cellulitis-bilateral legs Musculoskeletal Medical History: Past Medical History Notes: Osteopenia Psychiatric Medical History: Past Medical History Notes: Hx: Depression; Insomnia Noecker, Alexandr ([id number]) [id number] Page 9 of 10 Immunizations Pneumococcal Vaccine: Received Pneumococcal Vaccination: Yes Received Pneumococcal Vaccination On or After 60th Birthday: Yes Implantable Devices Yes Family and Social History Cancer: Yes - Father; Stroke: Yes - Mother,Siblings; Never smoker; Marital Status - Widowed; Alcohol Use: Never; Drug Use: No History; Caffeine Use: Never Social Determinants of Health (SDOH) 1. In the past 2 months, did you or others you live with eat smaller meals or skip meals because you didn't have money for foodo : No 2. Are you homeless or worried that you might be in the futureo : No 3. Do you have trouble paying for your utilities (gas, electricity, phone)o : No 4. Do you have trouble finding or paying for a rideo : No 5. Do you need daycare, or better daycare, for your kidso : No 6. Are you unemployed or without regular incomeo : No 7. Do you need help finding a better jobo : No 8. Do you need help getting more educationo : No 9. Are you concerned about someone in your home using drugs or alcoholo : No 10. Do you feel unsafe in your daily lifeo : No 11. Is anyone in your home threatening or abusing youo : No 12. Do you lack quality relationships that  make you feel valued and supportedo : No 13. Do you need help getting cultural information in a language you understando : No 14. Do you need help getting internet accesso : No Advanced Directives and Instructions Spiritual or Cultural beliefs preclude asking about Advance Care Planning: No Advanced Directives: No Patient wants information on Advanced Directives: No Do not resuscitate: No Living Will:  No Medical Power of Attorney: No Surrogate Decision Maker: No Notes Pacemaker-implant Electronic Signature(s) Signed: 06/30/2023 6:28:11 PM By: [name] Pollen RN Signed: 07/05/2023 12:04:19 PM By: [name] Raisin DO Entered By: [name] on 06/30/2023 08:24:14 -------------------------------------------------------------------------------- SuperBill Details Patient Name: Date of Service: [name], Jasneet 06/30/2023 Medical Record Number: [id number] Patient Account Number: [id number] Date of Birth/Sex: Treating RN: [birth year] (89 y.o. 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 Physician Procedures : CPT4 Code Description Modifier [phone] [id number] - WC PHYS LEVEL 4 - EST PT ICD-10 Diagnosis Description L97.811 Non-pressure chronic ulcer of other part of right lower leg limited to breakdown of skin L97.821 Non-pressure chronic ulcer of other part of  left lower leg limited to breakdown of skin I89.0 Lymphedema, not elsewhere classified I87.311 Chronic venous hypertension (idiopathic) with ulcer of right lower extremity Quantity: 1 Electronic  Signature(s) Signed: 06/30/2023 6:28:11 PM By: [name] Pollen RN Signed: 07/05/2023 12:04:19 PM By: [name] Raisin DO Entered By: [name] on 06/30/2023 11:51:16

## 2023-07-07 ENCOUNTER — Encounter (HOSPITAL_BASED_OUTPATIENT_CLINIC_OR_DEPARTMENT_OTHER): Payer: Medicare Other | Admitting: Internal Medicine

## 2023-07-07 DIAGNOSIS — I87312 Chronic venous hypertension (idiopathic) with ulcer of left lower extremity: Secondary | ICD-10-CM

## 2023-07-07 DIAGNOSIS — I130 Hypertensive heart and chronic kidney disease with heart failure and stage 1 through stage 4 chronic kidney disease, or unspecified chronic kidney disease: Secondary | ICD-10-CM | POA: Diagnosis not present

## 2023-07-07 DIAGNOSIS — L97811 Non-pressure chronic ulcer of other part of right lower leg limited to breakdown of skin: Secondary | ICD-10-CM | POA: Diagnosis not present

## 2023-07-07 DIAGNOSIS — I87311 Chronic venous hypertension (idiopathic) with ulcer of right lower extremity: Secondary | ICD-10-CM

## 2023-07-07 DIAGNOSIS — L97821 Non-pressure chronic ulcer of other part of left lower leg limited to breakdown of skin: Secondary | ICD-10-CM | POA: Diagnosis not present

## 2023-07-07 DIAGNOSIS — I890 Lymphedema, not elsewhere classified: Secondary | ICD-10-CM | POA: Diagnosis not present

## 2023-07-07 NOTE — Progress Notes (Signed)
[name], [name] ([id number]) [id number] Page 1 of 8 Visit Report for 07/07/2023 Arrival Information Details Patient Name: Date of Service: PA [name], [name] 07/07/2023 3:45 PM Medical Record Number: [id number] Patient Account Number: [id number] Date of Birth/Sex: Treating RN: 1931/08/24 (89 y.o. Katrinka Blazing Primary Care Ayomide Purdy: Asencion Partridge Other Clinician: Referring Tatisha Cerino: Treating Capri Veals/Extender: Annabell Howells in Treatment: 1 Visit Information History Since Last Visit Added or deleted any medications: No Patient Arrived: [name] Any new allergies or adverse reactions: No Arrival Time: 15:41 Had a fall or experienced change in No Accompanied By: son activities of daily living that may affect Transfer Assistance: None risk of falls: Patient Identification Verified: Yes Signs or symptoms of abuse/neglect since last visito No Hospitalized since last visit: No Implantable device outside of the clinic excluding No cellular tissue based products placed in the center since last visit: Has Dressing in Place as Prescribed: Yes Has Compression in Place as Prescribed: Yes Pain Present Now: No Electronic Signature(s) Signed: 07/07/2023 5:02:02 PM By: Karie Schwalbe RN Entered By: Karie Schwalbe on 07/07/2023 15:41:45 -------------------------------------------------------------------------------- Clinic Level of Care Assessment Details Patient Name: Date of Service: PA [name], [name] 07/07/2023 3:45 PM Medical Record Number: [id number] Patient Account Number: [id number] Date of Birth/Sex: Treating RN: 1932-06-08 (89 y.o. Katrinka Blazing Primary Care Krimson Massmann: Asencion Partridge Other Clinician: Referring Jasmaine Rochel: Treating Odessa Nishi/Extender: Annabell Howells in Treatment: 1 Clinic Level of Care Assessment Items TOOL 4 Quantity Score X- 1 0 Use when only an EandM is performed on FOLLOW-UP visit ASSESSMENTS -  Nursing Assessment / Reassessment X- 1 10 Reassessment of Co-morbidities (includes updates in patient status) X- 1 5 Reassessment of Adherence to Treatment Plan ASSESSMENTS - Wound and Skin A ssessment / Reassessment []  - 0 Simple Wound Assessment / Reassessment - one wound X- 2 5 Complex Wound Assessment / Reassessment - multiple wounds []  - 0 Dermatologic / Skin Assessment (not related to wound area) ASSESSMENTS - Focused Assessment []  - 0 Circumferential Edema Measurements - multi extremities []  - 0 Nutritional Assessment / Counseling / Intervention Marmarth, [name] ([id number]) [id number] Page 2 of 8 []  - 0 Lower Extremity Assessment (monofilament, tuning fork, pulses) []  - 0 Peripheral Arterial Disease Assessment (using hand held doppler) ASSESSMENTS - Ostomy and/or Continence Assessment and Care []  - 0 Incontinence Assessment and Management []  - 0 Ostomy Care Assessment and Management (repouching, etc.) PROCESS - Coordination of Care X - Simple Patient / Family Education for ongoing care 1 15 []  - 0 Complex (extensive) Patient / Family Education for ongoing care X- 1 10 Staff obtains Chiropractor, Records, T Results / Process Orders est X- 1 10 Staff telephones HHA, Nursing Homes / Clarify orders / etc []  - 0 Routine Transfer to another Facility (non-emergent condition) []  - 0 Routine Hospital Admission (non-emergent condition) []  - 0 New Admissions / Manufacturing engineer / Ordering NPWT Apligraf, etc. , []  - 0 Emergency Hospital Admission (emergent condition) X- 1 10 Simple Discharge Coordination []  - 0 Complex (extensive) Discharge Coordination PROCESS - Special Needs []  - 0 Pediatric / Minor Patient Management []  - 0 Isolation Patient Management []  - 0 Hearing / Language / Visual special needs []  - 0 Assessment of Community assistance (transportation, D/C planning, etc.) []  - 0 Additional assistance / Altered mentation []  -  0 Support Surface(s) Assessment (bed, cushion, seat, etc.) INTERVENTIONS - Wound Cleansing / Measurement []  - 0 Simple Wound Cleansing - one wound X- 2 5 Complex Wound Cleansing - multiple wounds  X- 1 5 Wound Imaging (photographs - any number of wounds) []  - 0 Wound Tracing (instead of photographs) []  - 0 Simple Wound Measurement - one wound X- 2 5 Complex Wound Measurement - multiple wounds INTERVENTIONS - Wound Dressings []  - 0 Small Wound Dressing one or multiple wounds X- 2 15 Medium Wound Dressing one or multiple wounds []  - 0 Large Wound Dressing one or multiple wounds []  - 0 Application of Medications - topical []  - 0 Application of Medications - injection INTERVENTIONS - Miscellaneous []  - 0 External ear exam []  - 0 Specimen Collection (cultures, biopsies, blood, body fluids, etc.) []  - 0 Specimen(s) / Culture(s) sent or taken to Lab for analysis []  - 0 Patient Transfer (multiple staff / Nurse, adult / Similar devices) []  - 0 Simple Staple / Suture removal (25 or less) []  - 0 Complex Staple / Suture removal (26 or more) []  - 0 Hypo / Hyperglycemic Management (close monitor of Blood Glucose) [name], [name] ([id number]) [id number] Page 3 of 8 []  - 0 Ankle / Brachial Index (ABI) - do not check if billed separately X- 1 5 Vital Signs Has the patient been seen at the hospital within the last three years: Yes Total Score: 130 Level Of Care: New/Established - Level 4 Electronic Signature(s) Signed: 07/07/2023 5:02:02 PM By: Karie Schwalbe RN Entered By: Karie Schwalbe on 07/07/2023 16:33:39 -------------------------------------------------------------------------------- Encounter Discharge Information Details Patient Name: Date of Service: [name], [name] 07/07/2023 3:45 PM Medical Record Number: [id number] Patient Account Number: [id number] Date of Birth/Sex: Treating RN: August 14, 1931 (89 y.o. Katrinka Blazing Primary Care Larone Kliethermes:  Asencion Partridge Other Clinician: Referring Anahit Klumb: Treating Wilburta Milbourn/Extender: Annabell Howells in Treatment: 1 Encounter Discharge Information Items Discharge Condition: Stable Ambulatory Status: Ambulatory Discharge Destination: Home Transportation: Private Auto Accompanied By: spouse Schedule Follow-up Appointment: Yes Clinical Summary of Care: Patient Declined Electronic Signature(s) Signed: 07/07/2023 5:02:02 PM By: Karie Schwalbe RN Entered By: Karie Schwalbe on 07/07/2023 16:34:36 -------------------------------------------------------------------------------- Lower Extremity Assessment Details Patient Name: Date of Service: PA LUZCLARITA, BLILEY 07/07/2023 3:45 PM Medical Record Number: [id number] Patient Account Number: [id number] Date of Birth/Sex: Treating RN: 1931/10/15 (89 y.o. Katrinka Blazing Primary Care Tamicka Shimon: Asencion Partridge Other Clinician: Referring Emri Sample: Treating Sherrill Buikema/Extender: Angie Fava Weeks in Treatment: 1 Edema Assessment Assessed: [Left: No] [Right: No] [Left: Edema] [Right: :] Calf Left: Right: Point of Measurement: 27 cm From Medial Instep 38 cm 38 cm Ankle Left: Right: Point of Measurement: 9 cm From Medial Instep 24 cm 24 cm Vascular Assessment Left: [id number] Page 4 of 8Right:] Pulses: Dorsalis Pedis Palpable: [id number] Page 4 of 8Yes Yes] Electronic Signature(s) Signed: 07/07/2023 5:02:02 PM By: Karie Schwalbe RN Entered By: Karie Schwalbe on 07/07/2023 15:55:56 -------------------------------------------------------------------------------- Multi Wound Chart Details Patient Name: Date of Service: [name], Claris 07/07/2023 3:45 PM Medical Record Number: [id number] Patient Account Number: [id number] Date of Birth/Sex: Treating RN: [birth year] (89 y.o. F) Primary Care Manila Rommel: Asencion Partridge Other Clinician: Referring Vernida Mcnicholas: Treating  Benoit Meech/Extender: Annabell Howells in Treatment: 1 Vital Signs Height(in): 59 Pulse(bpm): 90 Weight(lbs): 146 Blood Pressure(mmHg): 142/59 Body Mass Index(BMI): 29.5 Temperature(F): 98.8 Respiratory Rate(breaths/min): 18 [5:Photos:] [N/A:N/A] Right, Anterior Lower Leg Left, Anterior Lower Leg N/A Wound Location: Gradually Appeared Gradually Appeared N/A Wounding Event: Venous Leg Ulcer Venous Leg Ulcer N/A Primary Etiology: Asthma, Hypertension, Vasculitis Asthma, Hypertension, Vasculitis N/A Comorbid History: 06/22/2023 06/22/2023 N/A Date Acquired: 1 1 N/A Weeks of Treatment: Open Open N/A Wound Status: No No N/A Wound Recurrence: 0.1x0.1x0.1  0.1x0.1x0.1 N/A Measurements L x W x D (cm) 0.008 0.008 N/A A (cm) : rea 0.001 0.001 N/A Volume (cm) : 97.50% 99.90% N/A % Reduction in A rea: 96.80% 99.90% N/A % Reduction in Volume: Full Thickness Without Exposed Full Thickness Without Exposed N/A Classification: Support Structures Support Structures None Present None Present N/A Exudate A mount: Flat and Intact Flat and Intact N/A Wound Margin: None Present (0%) None Present (0%) N/A Granulation A mount: None Present (0%) None Present (0%) N/A Necrotic A mount: Large (67-100%) Large (67-100%) N/A Epithelialization: Scarring: No Scarring: No N/A Periwound Skin Texture: No Abnormalities Noted No Abnormalities Noted N/A Periwound Skin Moisture: Rubor: No Rubor: No N/A Periwound Skin Color: No Abnormality No Abnormality N/A Temperature: Treatment Notes Electronic Signature(s) Signed: 07/07/2023 4:08:41 PM By: Geralyn Corwin DO Entered By: Geralyn Corwin on 07/07/2023 15:58:45 [name], [name] ([id number]) [id number] Page 5 of 8 -------------------------------------------------------------------------------- Multi-Disciplinary Care Plan Details Patient Name: Date of Service: PA [name], [name] 07/07/2023 3:45  PM Medical Record Number: [id number] Patient Account Number: [id number] Date of Birth/Sex: Treating RN: 02-14-1932 (89 y.o. Katrinka Blazing Primary Care Jamari Moten: Asencion Partridge Other Clinician: Referring Kalis Friese: Treating Cerrone Debold/Extender: Angie Fava Weeks in Treatment: 1 Active Inactive Electronic Signature(s) Signed: 07/07/2023 5:02:02 PM By: Karie Schwalbe RN Entered By: Karie Schwalbe on 07/07/2023 17:00:59 -------------------------------------------------------------------------------- Pain Assessment Details Patient Name: Date of Service: [name], [name] 07/07/2023 3:45 PM Medical Record Number: [id number] Patient Account Number: [id number] Date of Birth/Sex: Treating RN: Mar 02, 1932 (89 y.o. Katrinka Blazing Primary Care Blu Mcglaun: Asencion Partridge Other Clinician: Referring Gabor Lusk: Treating Diogo Anne/Extender: Angie Fava Weeks in Treatment: 1 Active Problems Location of Pain Severity and Description of Pain Patient Has Paino No Site Locations Pain Management and Medication Current Pain Management: Electronic Signature(s) Signed: 07/07/2023 5:02:02 PM By: Karie Schwalbe RN Entered By: Karie Schwalbe on 07/07/2023 15:42:43 [name], [name] ([id number]) [id number] Page 6 of 8 -------------------------------------------------------------------------------- Patient/Caregiver Education Details Patient Name: Date of Service: PA [name], [name] 1/16/2025andnbsp3:45 PM Medical Record Number: [id number] Patient Account Number: [id number] Date of Birth/Gender: Treating RN: 08/07/1931 (89 y.o. Katrinka Blazing Primary Care Physician: Asencion Partridge Other Clinician: Referring Physician: Treating Physician/Extender: Annabell Howells in Treatment: 1 Education Assessment Education Provided To: Patient Education Topics Provided Wound/Skin Impairment: Methods: Explain/Verbal Responses: State  content correctly Electronic Signature(s) Signed: 07/07/2023 5:02:02 PM By: Karie Schwalbe RN Entered By: Karie Schwalbe on 07/07/2023 16:20:57 -------------------------------------------------------------------------------- Wound Assessment Details Patient Name: Date of Service: [name], [name] 07/07/2023 3:45 PM Medical Record Number: [id number] Patient Account Number: [id number] Date of Birth/Sex: Treating RN: 05-10-1932 (89 y.o. Katrinka Blazing Primary Care Aleeya Veitch: Asencion Partridge Other Clinician: Referring Vickie Melnik: Treating Angelyn Osterberg/Extender: Angie Fava Weeks in Treatment: 1 Wound Status Wound Number: 5 Primary Etiology: Venous Leg Ulcer Wound Location: Right, Anterior Lower Leg Wound Status: Healed - Epithelialized Wounding Event: Gradually Appeared Comorbid History: Asthma, Hypertension, Vasculitis Date Acquired: 06/22/2023 Weeks Of Treatment: 1 Clustered Wound: No Photos Wound Measurements Length: (cm) [name], [name] ([id number]) Width: (cm) Depth: (cm) Area: (cm) Volume: (cm) 0 % Reduction in Area: 100% [id number] Page 7 of 8 0 % Reduction in Volume: 100% 0 Epithelialization: Large (67-100%) 0 Tunneling: No 0 Undermining: No Wound Description Classification: Full Thickness Without Exposed Support Structures Wound Margin: Flat and Intact Exudate Amount: None Present Foul Odor After Cleansing: No Slough/Fibrino No Wound Bed Granulation Amount: None Present (0%) Necrotic Amount: None Present (0%) Periwound Skin Texture Texture Color No Abnormalities Noted: Yes No Abnormalities Noted:  Yes Moisture Temperature / Pain No Abnormalities Noted: Yes Temperature: No Abnormality Electronic Signature(s) Signed: 07/07/2023 5:02:02 PM By: Karie Schwalbe RN Entered By: Karie Schwalbe on 07/07/2023 16:02:15 -------------------------------------------------------------------------------- Wound Assessment Details Patient  Name: Date of Service: [name], [name] 07/07/2023 3:45 PM Medical Record Number: [id number] Patient Account Number: [id number] Date of Birth/Sex: Treating RN: September 25, 1931 (89 y.o. Katrinka Blazing Primary Care Arelia Volpe: Asencion Partridge Other Clinician: Referring Cassaundra Rasch: Treating Derwin Reddy/Extender: Angie Fava Weeks in Treatment: 1 Wound Status Wound Number: 6 Primary Etiology: Venous Leg Ulcer Wound Location: Left, Anterior Lower Leg Wound Status: Healed - Epithelialized Wounding Event: Gradually Appeared Comorbid History: Asthma, Hypertension, Vasculitis Date Acquired: 06/22/2023 Weeks Of Treatment: 1 Clustered Wound: No Photos Wound Measurements Length: (cm) Width: (cm) Depth: (cm) Area: (cm) Volume: (cm) 0 % Reduction in Area: 100% 0 % Reduction in Volume: 100% 0 Epithelialization: Large (67-100%) 0 Tunneling: No 0 Undermining: No Wound Description Classification: Full Thickness Without Exposed Support Structures [name], [name] ([id number]) Wound Margin: Flat and Intact Exudate Amount: None Present Foul Odor After Cleansing: No [id number] Page 8 of 8 Slough/Fibrino No Wound Bed Granulation Amount: None Present (0%) Exposed Structure Necrotic Amount: None Present (0%) Fascia Exposed: No Fat Layer (Subcutaneous Tissue) Exposed: No Tendon Exposed: No Muscle Exposed: No Joint Exposed: No Bone Exposed: No Periwound Skin Texture Texture Color No Abnormalities Noted: No No Abnormalities Noted: No Scarring: No Rubor: No Moisture Temperature / Pain No Abnormalities Noted: Yes Temperature: No Abnormality Electronic Signature(s) Signed: 07/07/2023 5:02:02 PM By: Karie Schwalbe RN Entered By: Karie Schwalbe on 07/07/2023 16:02:40 -------------------------------------------------------------------------------- Vitals Details Patient Name: Date of Service: [name], [name] 07/07/2023 3:45 PM Medical Record Number:  [id number] Patient Account Number: [id number] Date of Birth/Sex: Treating RN: 06-25-1931 (89 y.o. Katrinka Blazing Primary Care Rohan Juenger: Asencion Partridge Other Clinician: Referring Dallas Torok: Treating Bryttney Netzer/Extender: Angie Fava Weeks in Treatment: 1 Vital Signs Time Taken: 15:41 Temperature (F): 98.8 Height (in): 59 Pulse (bpm): 90 Weight (lbs): 146 Respiratory Rate (breaths/min): 18 Body Mass Index (BMI): 29.5 Blood Pressure (mmHg): 142/59 Reference Range: 80 - 120 mg / dl Electronic Signature(s) Signed: 07/07/2023 5:02:02 PM By: Karie Schwalbe RN Entered By: Karie Schwalbe on 07/07/2023 15:42:35

## 2023-07-07 NOTE — Progress Notes (Addendum)
[name], [name] ([id number]) [id number] Page 1 of 7 Visit Report for 07/07/2023 Chief Complaint Document Details Patient Name: Date of Service: PA [name], [name] 07/07/2023 3:45 PM Medical Record Number: [id number] Patient Account Number: [id number] Date of Birth/Sex: Treating RN: March 07, 1932 (89 y.o. F) Primary Care Provider: Asencion Partridge Other Clinician: Referring Provider: Treating Provider/Extender: Annabell Howells in Treatment: 1 Information Obtained from: Patient Chief Complaint 06/30/2023; patient presents for scattered open wounds limited to skin breakdown in the setting of lymphedema Electronic Signature(s) Signed: 07/07/2023 4:08:41 PM By: Geralyn Corwin DO Entered By: Geralyn Corwin on 07/07/2023 16:05:12 -------------------------------------------------------------------------------- HPI Details Patient Name: Date of Service: [name], [name] 07/07/2023 3:45 PM Medical Record Number: [id number] Patient Account Number: [id number] Date of Birth/Sex: Treating RN: 1932-01-26 (89 y.o. F) Primary Care Provider: Asencion Partridge Other Clinician: Referring Provider: Treating Provider/Extender: Annabell Howells in Treatment: 1 History of Present Illness HPI Description: ADMISSION 11/04/2022 This is an 89 year old woman with a past medical history notable for congestive heart failure, complete heart block status post pacemaker placement, impaired glucose tolerance, hypertension, chronic venous insufficiency and bilateral lower extremity edema, CKD stage IIIa, and hypothyroidism. She has struggled with worsening lower extremity edema and recently had her diuretic change from furosemide to torsemide. She was treated in April for cellulitis with a 10-day course of cephalexin. She apparently had a similar issue in December of this past year which responded to [employer] boots and doxycycline. Compression stockings and leg elevation  were recommended. She was also referred to the wound center due to what was described in the electronic medical record and as thickened eschar and scabbed bullae. ABIs in clinic today was 0.99 on the right and 0.92 on the left. She does not wear compression stockings because she finds them constricting and uncomfortable. 11/19/2022: There is a tiny residual opening on the right leg, but everything else is healed. 11/22/2022: Because of the way the [facility] wraps were ordered (only one ordered despite bilateral wounds on initial intake), we ended up having to apply an Unna boot on the left leg and the Farrow wrap on the right. For some reason, the patient had a significant reaction to the stocking and experienced significant itching and developed blisters on her leg. They called to make an urgent appointment today to have this evaluated. I suspect she may have an allergy to what ever is on the stockings straight from the factory. 6/7; the patient to use has venous and lymphedema. She had wounds on both legs that are technically healed. She was ordered Farrow wraps for swelling control. She developed a fairly angry rash on the right leg in all likelihood contact dermatitis from the stocking layer of the Farrow wrap. We therefore wrapped the right leg. She returns today with this looking quite a bit better. Her son is purchased a stocking for the left leg. We are going to order some form of compression stocking for both legs use the new stocking as the inner sleeve for her Farrow wrap. 12/03/2022: The wounds are healed. A pair of juxta lite stockings was ordered and overnighted to her; they should arrive today. 06/30/2023 [name] is an 89 year old female with a past medical history of CHF, complete heart block status post pacemaker placement, venous insufficiency/lymphedema that presents to the clinic for scattered open wounds to her lower extremities bilaterally. She states that over the past couple  [name] she has had excessive itching to her legs and when scratching them has developed better  open wounds. She reports minimal weeping. She does have compression stockings and has been wearing them. She currently denies signs of infection including increased warmth, erythema or purulent drainage. She denies pain. She denies fever/chills. She feels overall herself. [name], [name] ([id number]) [id number] Page 2 of 7 07/07/2023; patient presents for follow-up. We have been using antibiotic ointment with triamcinolone cream under Kerlix/Coban to the lower extremities bilaterally. The scattered open wounds to the legs have healed. She has no issues or complaints today. She has compression stockings at home. Electronic Signature(s) Signed: 07/07/2023 4:08:41 PM By: Geralyn Corwin DO Entered By: Geralyn Corwin on 07/07/2023 16:06:25 -------------------------------------------------------------------------------- Physical Exam Details Patient Name: Date of Service: [name], [name] 07/07/2023 3:45 PM Medical Record Number: [id number] Patient Account Number: [id number] Date of Birth/Sex: Treating RN: [birth year] (89 y.o. F) Primary Care Provider: Asencion Partridge Other Clinician: Referring Provider: Treating Provider/Extender: [name] in Treatment: 1 Constitutional respirations regular, non-labored and within target range for patient.. Cardiovascular 2+ dorsalis pedis/posterior tibialis pulses. Psychiatric pleasant and cooperative. Notes Epithelization to the previous wound sites on the lower extremities bilaterally. No weeping noted. Good edema control. Electronic Signature(s) Signed: 07/07/2023 4:08:41 PM By: Geralyn Corwin DO Entered By: Geralyn Corwin on 07/07/2023 16:06:52 -------------------------------------------------------------------------------- Physician Orders Details Patient Name: Date of Service: [name], [name] 07/07/2023  3:45 PM Medical Record Number: [id number] Patient Account Number: [id number] Date of Birth/Sex: Treating RN: 09-23-1931 (89 y.o. Katrinka Blazing Primary Care Provider: Asencion Partridge Other Clinician: Referring Provider: Treating Provider/Extender: Annabell Howells in Treatment: 1 Verbal / Phone Orders: No Diagnosis Coding ICD-10 Coding Code Description L97.811 Non-pressure chronic ulcer of other part of right lower leg limited to breakdown of skin L97.821 Non-pressure chronic ulcer of other part of left lower leg limited to breakdown of skin I89.0 Lymphedema, not elsewhere classified I87.311 Chronic venous hypertension (idiopathic) with ulcer of right lower extremity I87.312 Chronic venous hypertension (idiopathic) with ulcer of left lower extremity Discharge From [facility] Discharge from Wound Care Center - Congratulations! Your Wounds are healed! Please take Tubigrips off the legs at night. Please put compression stockings on your legs in the morning and take off at night. [name], [name] ([id number]) [id number] Page 3 of 7 Electronic Signature(s) Signed: 07/07/2023 4:08:41 PM By: Geralyn Corwin DO Entered By: Geralyn Corwin on 07/07/2023 16:07:00 -------------------------------------------------------------------------------- Problem List Details Patient Name: Date of Service: [name], Khylei 07/07/2023 3:45 PM Medical Record Number: [id number] Patient Account Number: [id number] Date of Birth/Sex: Treating RN: [birth year] (89 y.o. F) Primary Care Provider: Asencion Partridge Other Clinician: Referring Provider: Treating Provider/Extender: Annabell Howells in Treatment: 1 Active Problems ICD-10 Encounter Code Description Active Date MDM Diagnosis L97.811 Non-pressure chronic ulcer of other part of right lower leg limited to breakdown 06/30/2023 No Yes of skin L97.821 Non-pressure chronic ulcer of other part of  left lower leg limited to breakdown 06/30/2023 No Yes of skin I89.0 Lymphedema, not elsewhere classified 06/30/2023 No Yes I87.311 Chronic venous hypertension (idiopathic) with ulcer of right lower extremity 06/30/2023 No Yes I87.312 Chronic venous hypertension (idiopathic) with ulcer of left lower extremity 06/30/2023 No Yes Inactive Problems Resolved Problems Electronic Signature(s) Signed: 07/07/2023 4:08:41 PM By: Geralyn Corwin DO Entered By: Geralyn Corwin on 07/07/2023 15:55:45 -------------------------------------------------------------------------------- Progress Note Details Patient Name: Date of Service: [name], [name] 07/07/2023 3:45 PM Medical Record Number: [id number] Patient Account Number: [id number] Date of Birth/Sex: Treating RN: 09-08-1931 (89 y.o. F) Primary Care Provider: Asencion Partridge Other Clinician: Referring Provider: Treating Provider/Extender:  [name] in Treatment: 1 [name], [name] ([id number]) [id number] Page 4 of 7 Subjective Chief Complaint Information obtained from Patient 06/30/2023; patient presents for scattered open wounds limited to skin breakdown in the setting of lymphedema History of Present Illness (HPI) ADMISSION 11/04/2022 This is an 89 year old woman with a past medical history notable for congestive heart failure, complete heart block status post pacemaker placement, impaired glucose tolerance, hypertension, chronic venous insufficiency and bilateral lower extremity edema, CKD stage IIIa, and hypothyroidism. She has struggled with worsening lower extremity edema and recently had her diuretic change from furosemide to torsemide. She was treated in April for cellulitis with a 10-day course of cephalexin. She apparently had a similar issue in December of this past year which responded to [employer] boots and doxycycline. Compression stockings and leg elevation were recommended. She was also referred to  the wound center due to what was described in the electronic medical record and as thickened eschar and scabbed bullae. ABIs in clinic today was 0.99 on the right and 0.92 on the left. She does not wear compression stockings because she finds them constricting and uncomfortable. 11/19/2022: There is a tiny residual opening on the right leg, but everything else is healed. 11/22/2022: Because of the way the [facility] wraps were ordered (only one ordered despite bilateral wounds on initial intake), we ended up having to apply an Unna boot on the left leg and the Farrow wrap on the right. For some reason, the patient had a significant reaction to the stocking and experienced significant itching and developed blisters on her leg. They called to make an urgent appointment today to have this evaluated. I suspect she may have an allergy to what ever is on the stockings straight from the factory. 6/7; the patient to use has venous and lymphedema. She had wounds on both legs that are technically healed. She was ordered Farrow wraps for swelling control. She developed a fairly angry rash on the right leg in all likelihood contact dermatitis from the stocking layer of the Farrow wrap. We therefore wrapped the right leg. She returns today with this looking quite a bit better. Her son is purchased a stocking for the left leg. We are going to order some form of compression stocking for both legs use the new stocking as the inner sleeve for her Farrow wrap. 12/03/2022: The wounds are healed. A pair of juxta lite stockings was ordered and overnighted to her; they should arrive today. 06/30/2023 Ms. Lakrystal Rodden is an 89 year old female with a past medical history of CHF, complete heart block status post pacemaker placement, venous insufficiency/lymphedema that presents to the clinic for scattered open wounds to her lower extremities bilaterally. She states that over the past couple [name] she has had excessive itching to her  legs and when scratching them has developed better open wounds. She reports minimal weeping. She does have compression stockings and has been wearing them. She currently denies signs of infection including increased warmth, erythema or purulent drainage. She denies pain. She denies fever/chills. She feels overall herself. 07/07/2023; patient presents for follow-up. We have been using antibiotic ointment with triamcinolone cream under Kerlix/Coban to the lower extremities bilaterally. The scattered open wounds to the legs have healed. She has no issues or complaints today. She has compression stockings at home. Patient History Information obtained from Patient, Chart. Family History Cancer - Father, Stroke - Mother,Siblings. Social History Never smoker, Marital Status - Widowed, Alcohol Use - Never, Drug Use - No History,  Caffeine Use - Never. Medical History Respiratory Patient has history of Asthma Cardiovascular Patient has history of Hypertension, Vasculitis - venous insufficiency Medical A Surgical History Notes nd Eyes Cataract surgery. 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 Cardiovascular Pacemaker (over 15 yrs) Endocrine Hypothyroidism Last Hem A1C was 6.2 (01/03/23)-denies being Diabetic Genitourinary Stage 3a CKD Integumentary (Skin) Hx: Cellulitis-bilateral legs Musculoskeletal Osteopenia Psychiatric Hx: Depression; Insomnia Objective Constitutional respirations regular, non-labored and within target range for patient.. Vitals Time Taken: 3:41 PM, Height: 59 in, Weight: 146 lbs, BMI: 29.5, Temperature: 98.8 F, Pulse: 90 bpm, Respiratory Rate: 18 breaths/min, Blood Pressure: [name], [name] ([id number]) [id number] Page 5 of 7 142/59 mmHg. Cardiovascular 2+ dorsalis pedis/posterior tibialis pulses. Psychiatric pleasant and cooperative. General Notes: Epithelization to the previous wound sites on the lower extremities  bilaterally. No weeping noted. Good edema control. Integumentary (Hair, Skin) Wound #5 status is Healed - Epithelialized. Original cause of wound was Gradually Appeared. The date acquired was: 06/22/2023. The wound has been in treatment 1 [name]. The wound is located on the Right,Anterior Lower Leg. The wound measures 0cm length x 0cm width x 0cm depth; 0cm^2 area and 0cm^3 volume. There is no tunneling or undermining noted. There is a none present amount of drainage noted. The wound margin is flat and intact. There is no granulation within the wound bed. There is no necrotic tissue within the wound bed. The periwound skin appearance had no abnormalities noted for texture. The periwound skin appearance had no abnormalities noted for moisture. The periwound skin appearance had no abnormalities noted for color. Periwound temperature was noted as No Abnormality. Wound #6 status is Healed - Epithelialized. Original cause of wound was Gradually Appeared. The date acquired was: 06/22/2023. The wound has been in treatment 1 [name]. The wound is located on the Left,Anterior Lower Leg. The wound measures 0cm length x 0cm width x 0cm depth; 0cm^2 area and 0cm^3 volume. There is no tunneling or undermining noted. There is a none present amount of drainage noted. The wound margin is flat and intact. There is no granulation within the wound bed. There is no necrotic tissue within the wound bed. The periwound skin appearance had no abnormalities noted for moisture. The periwound skin appearance did not exhibit: Scarring, Rubor. Periwound temperature was noted as No Abnormality. Assessment Active Problems ICD-10 Non-pressure chronic ulcer of other part of right lower leg limited to breakdown of skin Non-pressure chronic ulcer of other part of left lower leg limited to breakdown of skin Lymphedema, not elsewhere classified Chronic venous hypertension (idiopathic) with ulcer of right lower extremity Chronic venous  hypertension (idiopathic) with ulcer of left lower extremity Patient has done well with antibiotic ointment, triamcinolone cream and into boots. Her wounds have healed. I recommended she wear compression stockings daily. Follow-up as needed. Plan Discharge From [facility] Services: Discharge from Wound Care Center - Congratulations! Your Wounds are healed! Please take Tubigrips off the legs at night. Please put compression stockings on your legs in the morning and take off at night. 1. Compression stockings daily -gave Tubigrip in office 2. Discharge from clinic due to closed wound 3. Follow-up as needed Electronic Signature(s) Signed: 07/07/2023 4:08:41 PM By: Geralyn Corwin DO Entered By: Geralyn Corwin on 07/07/2023 16:07:43 -------------------------------------------------------------------------------- HxROS Details Patient Name: Date of Service: [name], [name] 07/07/2023 3:45 PM Medical Record Number: [id number] Patient Account Number: [id number] Date of Birth/Sex: Treating RN: [birth year] (89 y.o. F) Primary Care Provider: Asencion Partridge Other Clinician: Referring Provider: Treating Provider/Extender: Mikey Bussing  Austin Miles, Durward Mallard [name] in Treatment: 1 [name], [place] ([id number]) [id number] Page 6 of 7 Information Obtained From Patient Chart Eyes Medical History: Past Medical History Notes: Cataract surgery. 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 Respiratory Medical History: Positive for: Asthma Cardiovascular Medical History: Positive for: Hypertension; Vasculitis - venous insufficiency Past Medical History Notes: Pacemaker (over 15 yrs) Endocrine Medical History: Past Medical History Notes: Hypothyroidism Last Hem A1C was 6.2 (01/03/23)-denies being Diabetic Genitourinary Medical History: Past Medical History Notes: Stage 3a CKD Integumentary (Skin) Medical History: Past Medical History Notes: Hx:  Cellulitis-bilateral legs Musculoskeletal Medical History: Past Medical History Notes: Osteopenia Psychiatric Medical History: Past Medical History Notes: Hx: Depression; Insomnia Immunizations Pneumococcal Vaccine: Received Pneumococcal Vaccination: Yes Received Pneumococcal Vaccination On or After 60th Birthday: Yes Implantable Devices Yes Family and Social History Cancer: Yes - Father; Stroke: Yes - Mother,Siblings; Never smoker; Marital Status - Widowed; Alcohol Use: Never; Drug Use: No History; Caffeine Use: Never Social Determinants of Health (SDOH) 1. In the past 2 months, did you or others you live with eat smaller meals or skip meals because you didn't have money for foodo : No 2. Are you homeless or worried that you might be in the futureo : No 3. Do you have trouble paying for your utilities (gas, electricity, phone)o : No 4. Do you have trouble finding or paying for a rideo : No 5. Do you need daycare, or better daycare, for your kidso : No 6. Are you unemployed or without regular incomeo : No 7. Do you need help finding a better jobo : No 8. Do you need help getting more educationo : No 9. Are you concerned about someone in your home using drugs or alcoholo : No 10. Do you feel unsafe in your daily lifeo : No 11. Is anyone in your home threatening or abusing youo : No 12. Do you lack quality relationships that make you feel valued and supportedo : No 13. Do you need help getting cultural information in a language you understando : No 14. Do you need help getting internet accesso : No [name], [name] ([id number]) [id number] Page 7 of 7 Advanced Directives and Instructions Spiritual or Cultural beliefs preclude asking about Advance Care Planning: No Advanced Directives: No Patient wants information on Advanced Directives: No Do not resuscitate: No Living Will: No Medical Power of Attorney: No Surrogate Decision Maker: No Electronic  Signature(s) Signed: 07/07/2023 4:08:41 PM By: Geralyn Corwin DO Entered By: Geralyn Corwin on 07/07/2023 16:06:31 -------------------------------------------------------------------------------- SuperBill Details Patient Name: Date of Service: [name], [name] 07/07/2023 Medical Record Number: [id number] Patient Account Number: [id number] Date of Birth/Sex: Treating RN: 11-11-1931 (89 y.o. F) Primary Care Provider: Asencion Partridge Other Clinician: Referring Provider: Treating Provider/Extender: Annabell Howells in Treatment: 1 Diagnosis Coding ICD-10 Codes Code Description [phone] Non-pressure chronic ulcer of other part of right lower leg limited to breakdown of skin L97.821 Non-pressure chronic ulcer of other part of left lower leg limited to breakdown of skin I89.0 Lymphedema, not elsewhere classified I87.311 Chronic venous hypertension (idiopathic) with ulcer of right lower extremity I87.312 Chronic venous hypertension (idiopathic) with ulcer of left lower extremity Facility Procedures : CPT4 Code: [id number] Description: 99214 - WOUND CARE VISIT-LEV 4 EST PT Modifier: Quantity: 1 Physician Procedures : CPT4 Code Description Modifier [id number] - WC PHYS LEVEL 3 - EST PT ICD-10 Diagnosis Description L97.811 Non-pressure chronic ulcer of other part of right lower leg limited to breakdown of skin L97.821 Non-pressure chronic ulcer  of other part of  left lower leg limited to breakdown of skin I87.311 Chronic venous hypertension (idiopathic) with ulcer of right lower extremity I87.312 Chronic venous hypertension (idiopathic) with ulcer of left lower extremity Quantity: 1 Electronic Signature(s) Signed: 07/07/2023 5:02:02 PM By: Karie Schwalbe RN Signed: 07/08/2023 12:22:19 PM By: Geralyn Corwin DO Previous Signature: 07/07/2023 4:08:41 PM Version By: Geralyn Corwin DO Entered By: Karie Schwalbe on 07/07/2023 16:33:52

## 2023-07-14 ENCOUNTER — Ambulatory Visit: Payer: Medicare Other | Admitting: Cardiovascular Disease

## 2023-07-20 ENCOUNTER — Encounter: Payer: Self-pay | Admitting: Podiatry

## 2023-07-20 ENCOUNTER — Ambulatory Visit (INDEPENDENT_AMBULATORY_CARE_PROVIDER_SITE_OTHER): Payer: Medicare Other | Admitting: Podiatry

## 2023-07-20 VITALS — Ht 59.0 in | Wt 142.0 lb

## 2023-07-20 DIAGNOSIS — B351 Tinea unguium: Secondary | ICD-10-CM | POA: Diagnosis not present

## 2023-07-20 DIAGNOSIS — M79675 Pain in left toe(s): Secondary | ICD-10-CM | POA: Diagnosis not present

## 2023-07-20 DIAGNOSIS — M79674 Pain in right toe(s): Secondary | ICD-10-CM | POA: Diagnosis not present

## 2023-07-24 ENCOUNTER — Encounter: Payer: Self-pay | Admitting: Podiatry

## 2023-07-24 NOTE — Progress Notes (Signed)
  Subjective:  Patient ID: [name], female    DOB: 02-06-1932,  MRN: [id number]  89 y.o. female presents to clinic with  painful, elongated thickened toenails x 10 which are symptomatic when wearing enclosed shoe gear. This interferes with his/her daily activities. She is accompanied by her son on today's visit. Chief Complaint  Patient presents with   RFC    She is here for a nail trim, her PCP is Dr. Mardelle Matte and was last seen about 3-4 months ago.      New problem(s): None   PCP is Willow Ora, MD.  Allergies  Allergen Reactions   Fluticasone-Salmeterol Other (See Comments)    unknown Other reaction(s): tongue/throat symptoms   Sulfa Antibiotics     Other reaction(s): gastric intol   Sulfonamide Derivatives Other (See Comments)    unknown   Tetracycline Hcl    Benzonatate Other (See Comments)    tongue/throat symptoms in the past but tolerated more recently Other reaction(s): and tongue/throat symptoms in the past (not more recently)   Erythromycin Nausea And Vomiting and Other (See Comments)    Stomach cramps Other reaction(s): gastric intol   Meloxicam Other (See Comments)    Mild dyspnea Other reaction(s): dyspnea   Sulfamethoxazole Other (See Comments)    Gastric intolerance    Review of Systems: Negative except as noted in the HPI.   Objective:  [name] is a pleasant 89 y.o. female WD, WN in NAD. AAO x 3.  Vascular Examination: Vascular status intact b/l with palpable pedal pulses. CFT immediate b/l. Trace edema noted BLE.No pain with calf compression b/l. Skin temperature gradient WNL b/l.   Neurological Examination: Sensation grossly intact b/l with 10 gram monofilament.   Dermatological Examination: Pedal skin with normal turgor, texture and tone b/l. Toenails 1-5 b/l thick, discolored, elongated with subungual debris and pain on dorsal palpation. No hyperkeratotic lesions noted b/l.   Musculoskeletal Examination: Muscle strength 5/5 to b/l LE.  HAV with bunion deformity noted b/l LE.  Radiographs: None  Last A1c:      Latest Ref Rng & Units 01/03/2023    3:37 PM  Hemoglobin A1C  Hemoglobin-A1c 4.6 - 6.5 % 6.2      Assessment:   1. Pain due to onychomycosis of toenails of both feet    Plan:  Patient was evaluated and treated. All patient's and/or POA's questions/concerns addressed on today's visit. Toenails 1-5 debrided in length and girth without incident. Continue soft, supportive shoe gear daily. Report any pedal injuries to medical professional. Call office if there are any questions/concerns. -Patient/POA to call should there be question/concern in the interim.  Return in about 3 months (around 10/18/2023).  Freddie Breech, DPM      [facility] LOCATION: [address] [place] [address]                   Office [phone]   [facility] LOCATION: [address] [place], [place] [address] Office [phone]

## 2023-07-28 ENCOUNTER — Encounter: Payer: Self-pay | Admitting: Cardiovascular Disease

## 2023-07-28 ENCOUNTER — Ambulatory Visit: Payer: Medicare Other | Attending: Cardiovascular Disease | Admitting: Cardiovascular Disease

## 2023-07-28 VITALS — BP 138/58 | HR 70 | Ht 59.0 in | Wt 147.6 lb

## 2023-07-28 DIAGNOSIS — I442 Atrioventricular block, complete: Secondary | ICD-10-CM | POA: Diagnosis not present

## 2023-07-28 DIAGNOSIS — E785 Hyperlipidemia, unspecified: Secondary | ICD-10-CM | POA: Diagnosis not present

## 2023-07-28 DIAGNOSIS — Z950 Presence of cardiac pacemaker: Secondary | ICD-10-CM | POA: Insufficient documentation

## 2023-07-28 DIAGNOSIS — I4719 Other supraventricular tachycardia: Secondary | ICD-10-CM | POA: Insufficient documentation

## 2023-07-28 DIAGNOSIS — I5032 Chronic diastolic (congestive) heart failure: Secondary | ICD-10-CM | POA: Diagnosis not present

## 2023-07-28 DIAGNOSIS — I495 Sick sinus syndrome: Secondary | ICD-10-CM | POA: Diagnosis not present

## 2023-07-28 DIAGNOSIS — R600 Localized edema: Secondary | ICD-10-CM | POA: Diagnosis not present

## 2023-07-28 DIAGNOSIS — I10 Essential (primary) hypertension: Secondary | ICD-10-CM | POA: Diagnosis not present

## 2023-07-28 NOTE — Progress Notes (Signed)
 Cardiology Office Note    Date:  07/28/2023   ID:  [name], DOB 25-Mar-1932, MRN [id number]  PCP:  Jodie Lavern CROME, MD  Cardiologist:  Debby Sor, MD   9  month F/U evaluation  History of Present Illness:  [name] is a 89 y.o. female  who underwent dual-chamber pacemaker implantation for complete heart block. She has a history of paroxysmal atrial fibrillation, a history of lower extremity edema as well as GERD. She also has a history of hypertension as well as hyperlipidemia.   A follow-up echo Doppler study on 10/23/2014 revealed an ejection fraction in the 50-55% range with grade 1 diastolic dysfunction and mild mitral regurgitation. Her last pacemaker interrogation was in June 2017 by Dr. Francyne.  At that time, she had normal pacemaker function and had 97% atrial pacing and 100% ventricular pacing.  There was no underlying escape rhythm.  Estimated longevity was 1.75-2.25 years.   Typically she goes to bed at midnight and wakes up at 6 AM.  She experiences a dry cough and is unaware of any postnasal drip.  She has a history of GERD.  She denies recent wheezing. She denies presyncope or syncope. At times she admits to intermittent leg swelling. She has a history of hypothyroidism on thyroid  replacement.    She sees Dr. Mirna for primary and endocrinologic care and saw him on 08/03/2016.  He is now part of the [facility] health system.  Her recent lipid study on pravastatin  from February 2018 showed a total cholesterol 137, triglycerides 89, HDL 61, and LDL 69.; Hemoglobin A1c was 5.8.  TSH was 3.2.  Vitamin D  level was 42.    She underwent a dual pacemaker generator changeout on 02/09/2017  after her battery reached ERI. She has had an evaluation for reflux related laryngitis/cough by Dr. Jesus.  She had remotely been on allergy  shots but stopped in early 2018.   I saw her in October 2018, she has done well and has been undergoing every 23-month pacemaker checks.   Unfortunately, she had broken her second metatarsal of her right foot in November 2019 and required wearing a boot for some time.  Her ambulation has been somewhat difficult since and she does use a cane for ambulation.  She has difficulty walking, and when doing so admits to leg fatigue and some hip discomfort.  She denies any exertional shortness of breath or chest tightness.  She is unaware of recent palpitations on her current therapy which includes diltiazem  180 mg, metoprolol  succinate 25 mg daily and in addition for blood pressure control she also is on valsartan  HCT 160/25 mg at times she does admit to some occasional ankle swelling at the end of the day but this is normalized in the morning.  She admits to sleeping well.  She is unaware of any recurrent episodes of atrial fibrillation.  She continues to be on pravastatin  for hyperlipidemia.  I reviewed the medical records of Dr. Altimer.  When she  saw Dr. Mirna her blood pressure on July 03, 2018 was 118/50.  Her LDL cholesterol at that time was 85 in December 2019 which had slightly improved from 3 months previously when her LDL was 54.   I evaluated her with a telemedicine visit on September 22, 2018.  At that time she was on valsartan  HCT 160/25, diltiazem  CD 1 8 mg and metoprolol  succinate 25 mg.  Her blood pressure was elevated and I had suggested  that she continue to monitor her blood pressure and that if additional blood pressure corneas were consistently elevated medication adjustment were necessary.   She was evaluated by Dr. Francyne in January 26, 2019 and her pacemaker was stable but she is pacemaker dependent and did not have any underlying escape rhythm.  Blood pressure was slightly elevated at 146/65.    I saw her in September 2020 at which time she denied any exertional chest pain or shortness of breath.  She did experience mild nonexertional left-sided discomfort and also admitted to some fatigue.  She typically was going to bed  around 11:30 AM.  She felt that she was sleeping well.  She has GERD and was scheduled to see Dr. May got in follow-up.  She saw Dr. Francyne on July 30, 2019.  She was pacemaker dependent without underlying escape rhythm.  She still had symptoms suggestive of chronotropic incompetence and her pacemaker was reprogrammed.  She had not had any recorded atrial fibrillation in years.  I saw her in March 2021 at which time she felt well and denied any anginal type chest pain.  She had experienced some mild intermittent leg swelling.    At times she notes some chest sensation nocturnally which sounded more GERD than ischemic.  She  continued to be on valsartan  HCT 160/25 mg, Toprol -XL 25 mg and diltiazem  180 mg daily.  She had been taking pantoprazole  in the morning.  She has hypothyroidism on levothyroxine .  She continued to take pravastatin  40 mg daily.  She was maintaining sinus rhythm.  I saw her on May 06, 2020.  Since her prior evaluation she continued to do well but had some back problems.  Occasionally she uses a cane to assist in walking.  She denies any chest pain.  She is scheduled to see Dr. Francyne in February for follow-up pacemaker.  She continues to be seen by Dr. Kayren who checks laboratory.  Most recent lipid studies in July 2021 showed total cholesterol 127, triglycerides 94, HDL 56 and LDL 64.  Hemoglobin A1c was 6.0.  I last saw her on February 13, 2021.  Since I last saw her, Dr. Kayren has retired.  She denies any chest pain or shortness of breath.  At times she has noted some mild left leg edema. She continues to be on furosemide  20 mg and has been taking this only 1 time per week.  She is on diltiazem  180 mg, metoprolol  25 mg daily and valsartan  HCT 160/25 mg for hypertension.  She is on low-dose levothyroxine  at 25 mcg for hypothyroidism.  She continues to be on pravastatin  40 mg daily and pantoprazole  40 mg for GERD.  During that evaluation her blood pressure was  controlled.  He was maintaining sinus rhythm without recurrent AF and ECG showed AV paced rhythm at 70 bpm.  She underwent an echo Doppler study on July 15, 2021 which showed EF 60 to 65%.  There was grade 1 diastolic dysfunction.  RV systolic pressure was 31.5 mmHg.  There was mild left atrial dilatation.  Valves were essentially normal.  She saw Dr. Francyne in February 2023 and pacemaker interrogation showed normal device function.  During that evaluation, she had lower extremity edema and diltiazem  dose was reduced.  I last saw her on Oct 25, 2022 which also was her birthday.  She has continued to experience significant bilateral lower extremity edema with scaliness and scarring and has venous insufficiency.  A lower extremity Doppler study in January 2023  was negative for DVT.  At times she admits to a cough with mild wheezing.  She denies chest pain.  She is on diltiazem  120 mg daily, furosemide  40 mg twice a day, metoprolol  succinate 25 mg at bedtime, valsartan  160 mg daily, and takes levothyroxine  50 mcg for hypothyroidism and pravastatin  40 mg for hyperlipidemia.  ECG at that time showed atrial sensing and ventricular pacing 79 bpm.  During that evaluation I recommended follow-up laboratory as well as a 2D echo Doppler assessment.  Was having significant lower extremity edema and furosemide  was changed to torsemide .  She was evaluated by Scot Ford, PA on November 23, 2022.  Her extremity edema had improved with the change to torsemide .  He was maintaining sinus rhythm on diltiazem  and metoprolol  succinate.  He continued to be on pravastatin  for hyperlipidemia.  On January 06 2023 she was last evaluated by Dr. Francyne in follow-up of her pacemaker.  In that evaluation her torsemide  was reduced to 40 mg daily.  Presently, he is on aspirin  81 mg, diltiazem  120 mg daily, levothyroxine  25 mcg, and continues to be on torsemide  40 mg in the morning and takes pravastatin  40 mg daily for hyperlipidemia.  She  also has continued to be on valsartan  160 mg daily.  She has not had recent laboratory.  She is fasting today.  He sees Dr. Lavern Heck for primary care.  She presents for evaluation.  Past Medical History:  Diagnosis Date   Allergic rhinitis    Anginal pain (HCC)    Asthmatic bronchitis    Chronic cough    Dysrhythmia    PAF   Esophageal reflux    GERD (gastroesophageal reflux disease)    History of stress test 11/20/2009   This was essentially normal,post-stress EF was hyperdynamic at 73 beats per mins, There was no scar or ischemia.   Hx of echocardiogram 11/05/2010   EF>55% showed a normal systolic function with impaired diastolic dysfuntion and also tissue doppler suggesting increased elevated left atrial pressure, she had moderate LA dilatation and mild LA dilatation. There was calcified mitral apparatus with moderate MR, mild to moderate TR and trace aortic insufficiency.   Hyperlipemia    Hypertension    Hypothyroidism    PAF (paroxysmal atrial fibrillation) (HCC)    Pneumonia     Past Surgical History:  Procedure Laterality Date   CHOLECYSTECTOMY     CYSTOSCOPY WITH BIOPSY N/A 08/29/2020   Procedure: CYSTOSCOPY WITH BIOPSY WITH FULGURATION;  Surgeon: Watt Rush, MD;  Location: [facility];  Service: Urology;  Laterality: N/A;   FOOT SURGERY     right   NASAL SEPTUM SURGERY     PACEMAKER INSERTION  June 1st 2009   St Jude Gilboa device, model #4173, serial #[id number], The artial lead is a Lexicographer, the ventricular lead is Runner, Broadcasting/film/video.   PPM GENERATOR CHANGEOUT N/A 02/09/2017   Procedure: PPM GENERATOR CHANGEOUT;  Surgeon: Francyne Headland, MD;  Location: [facility];  Service: Cardiovascular;  Laterality: N/A;   THYROID  SURGERY     TONSILLECTOMY     TONSILLECTOMY     VESICOVAGINAL FISTULA CLOSURE W/ TAH      Current Medications: Outpatient Medications Prior to Visit  Medication Sig Dispense Refill   acetaminophen  (TYLENOL ) 500 MG tablet Take 500 mg by mouth  as needed for mild pain.     Ascorbic Acid (VITAMIN C) 500 MG CHEW Chew 500 mg by mouth daily.     aspirin  81  MG chewable tablet Chew 81 mg by mouth at bedtime.     calcium carbonate (TUMS EX) 750 MG chewable tablet Chew 1 tablet by mouth daily as needed for heartburn.     cetirizine (ZYRTEC) 10 MG tablet      cholecalciferol (VITAMIN D3) 25 MCG (1000 UNIT) tablet Take 1 tablet (1,000 Units total) by mouth daily. 30 tablet 0   diltiazem  (CARDIZEM  CD) 120 MG 24 hr capsule TAKE 1 CAPSULE (120 MG TOTAL) BY MOUTH AT BEDTIME. 90 capsule 0   KLOR-CON  M20 20 MEQ tablet TAKE 1 TABLET (20 MEQ TOTAL) BY MOUTH DAILY. TAKE AN EXTRA TABLET ONCE DAILY WHEN YOU TAKE THE EXTRA FUROSEMIDE  135 tablet 3   levothyroxine  (SYNTHROID ) 25 MCG tablet TAKE 1 TABLET BY MOUTH EVERY DAY 90 tablet 3   LORazepam  (ATIVAN ) 0.5 MG tablet TAKE 1 TABLET BY MOUTH EVERY DAY AS NEEDED FOR ANXIETY 30 tablet 2   pantoprazole  (PROTONIX ) 20 MG tablet TAKE 1 TABLET BY MOUTH EVERY DAY 30 tablet 11   pravastatin  (PRAVACHOL ) 40 MG tablet TAKE 1 TABLET BY MOUTH EVERY DAY 90 tablet 3   torsemide  (DEMADEX ) 20 MG tablet TAKE 2 TABLETS (40 MG TOTAL) BY MOUTH IN THE MORNING AND 1 TABLET (20 MG TOTAL) EVERY EVENING. 270 tablet 1   traZODone  (DESYREL ) 50 MG tablet Take 0.5-1 tablets (25-50 mg total) by mouth at bedtime as needed for sleep. 30 tablet 5   valsartan  (DIOVAN ) 160 MG tablet Take 1 tablet (160 mg total) by mouth daily. 90 tablet 2   hydrOXYzine  (ATARAX ) 10 MG tablet Take 1 tablet (10 mg total) by mouth 3 (three) times daily as needed for itching. (Patient not taking: Reported on 07/28/2023) 30 tablet 0   predniSONE  (DELTASONE ) 20 MG tablet Take 2 tabs daily for 5 days (Patient not taking: Reported on 07/28/2023) 10 tablet 0   triamcinolone  cream (KENALOG ) 0.1 % Apply topically daily as needed. (Patient not taking: Reported on 07/28/2023)     No facility-administered medications prior to visit.     Allergies:   Fluticasone -salmeterol, Sulfa  antibiotics, Sulfonamide derivatives, Tetracycline hcl, Benzonatate , Erythromycin, Meloxicam, and Sulfamethoxazole   Social History   Socioeconomic History   Marital status: Widowed    Spouse name: Not on file   Number of children: Not on file   Years of education: Not on file   Highest education level: Not on file  Occupational History    Employer: RETIRED    Comment: Retired Heritage manager at [employer]  Tobacco Use   Smoking status: Never   Smokeless tobacco: Never  Vaping Use   Vaping status: Never Used  Substance and Sexual Activity   Alcohol use: No   Drug use: No   Sexual activity: Not Currently    Partners: Male    Comment: widowed  Other Topics Concern   Not on file  Social History Narrative   Not on file   Social Drivers of Health   Financial Resource Strain: Low Risk  (12/13/2022)   Overall Financial Resource Strain (CARDIA)    Difficulty of Paying Living Expenses: Not hard at all  Food Insecurity: No Food Insecurity (12/13/2022)   Hunger Vital Sign    Worried About Running Out of Food in the Last Year: Never true    Ran Out of Food in the Last Year: Never true  Transportation Needs: No Transportation Needs (12/13/2022)   PRAPARE - Transportation    Lack of Transportation (Medical): No    Lack of  Transportation (Non-Medical): No  Physical Activity: Insufficiently Active (12/13/2022)   Exercise Vital Sign    Days of Exercise per Week: 5 days    Minutes of Exercise per Session: 10 min  Stress: No Stress Concern Present (12/13/2022)   [employer] of Occupational Health - Occupational Stress Questionnaire    Feeling of Stress : Not at all  Social Connections: Moderately Isolated (12/13/2022)   Social Connection and Isolation Panel [NHANES]    Frequency of Communication with Friends and Family: More than three times a week    Frequency of Social Gatherings with Friends and Family: More than three times a week    Attends Religious Services: More  than 4 times per year    Active Member of [employer] or Organizations: No    Attends Banker Meetings: Never    Marital Status: Widowed     Family History:  The patient's family history includes Cancer in her father; Stroke in her mother and sister.   ROS General: Negative; No fevers, chills, or night sweats; patient will fatigue HEENT: Negative; No changes in vision or hearing, sinus congestion, difficulty swallowing Pulmonary: Negative; No cough, wheezing, shortness of breath, hemoptysis Cardiovascular: Occasional mild nonexertional left-sided chest sensation; venous insufficiency with tense lower extremity edema GI: GERD GU: Negative; No dysuria, hematuria, or difficulty voiding Musculoskeletal: Back and hip discomfort Hematologic/Oncology: Negative; no easy bruising, bleeding Endocrine: Negative; no heat/cold intolerance; no diabetes Neuro: Negative; no changes in balance, headaches Skin: Negative; No rashes or skin lesions Psychiatric: Negative; No behavioral problems, depression Sleep: Negative; No snoring, daytime sleepiness, hypersomnolence, bruxism, restless legs, hypnogognic hallucinations, no cataplexy Other comprehensive 14 point system review is negative.   PHYSICAL EXAM:   VS:  BP (!) 138/58   Pulse 70   Ht 4' 11 (1.499 m)   Wt 147 lb 9.6 oz (67 kg)   SpO2 93%   BMI 29.81 kg/m     Repeat blood pressure by me was 126/80  Wt Readings from Last 3 Encounters:  07/28/23 147 lb 9.6 oz (67 kg)  07/20/23 142 lb (64.4 kg)  05/11/23 142 lb (64.4 kg)   General: Alert, oriented, no distress.  Skin: normal turgor, no rashes, warm and dry HEENT: Normocephalic, atraumatic. Pupils equal round and reactive to light; sclera anicteric; extraocular muscles intact;  Nose without nasal septal hypertrophy Mouth/Parynx benign; Mallinpatti scale 3 Neck: No JVD, no carotid bruits; normal carotid upstroke Lungs: clear to ausculatation and percussion; no wheezing or  rales Chest wall: without tenderness to palpitation Heart: PMI not displaced, RRR, s1 s2 normal, 1/6 systolic murmur, no diastolic murmur, no rubs, gallops, thrills, or heaves Abdomen: soft, nontender; no hepatosplenomehaly, BS+; abdominal aorta nontender and not dilated by palpation. Back: no CVA tenderness Pulses 2+ Musculoskeletal: full range of motion, normal strength, no joint deformities Extremities: 3+ tense lower extremity edema with scaliness and scarring.  No clubbing cyanosis, Homan's sign negative  Neurologic: grossly nonfocal; Cranial nerves grossly wnl Psychologic: Normal mood and affect    Studies/Labs Reviewed:   EKG Interpretation Date/Time:  Thursday July 28 2023 08:03:24 EST Ventricular Rate:  70 PR Interval:  212 QRS Duration:  136 QT Interval:  468 QTC Calculation: 505 R Axis:   -41  Text Interpretation: AV dual-paced rhythm with prolonged AV conduction When compared with ECG of 15-Jul-2021 17:25, No significant change was found Confirmed by Burnard Ned ([id number]) on 07/29/2023 4:23:04 PM    Oct 25, 2022 ECG (independently read by me): A sensed V  paced at 79  February 13, 2021 ECG (independently read by me): AV paced rhythm at 70 with prolonged AV condiuction  March 2021 ECG (independently read by me): Atrially paced rhythm with prolonged AV conduction with PR interval 222 ms.  Left bundle branch block.  QTc interval 483 ms  September 2020 EKG:  EKG is ordered today.  ECG (independently read by me): AV paced rhythm at 70 bpm.  Prolonged AV conduction with PR interval at 222 ms.  October 2018 ECG (independently read by me): AV paced rhythm with a ventricular rate at 70 bpm.  PR interval 224 ms.  QTc interval 477 ms.   March 2018 ECG (independently read by me): AV paced rhythm at 70 bpm with prolonged AV conduction with a PR interval of 222 ms.  QTc interval 490 ms.   October 2017 ECG (independently read by me): AV paced rhythm with 100% pacing of both chambers.   PR interval 226 ms.     December 2016 ECG (independently read by me):  100% AV pacing with ventricular rate at 70 and prolonged AV conduction at 224 ms.   June 2016  ECG (independently read by me): AV sequentially paced rhythm with 100% capture.  Ventricular rate 70 beats per minute.  PR interval 224 ms.   August 2014 ECG: 80 paced rhythm at 70 beats per minute. PR interval 222 ms.    Recent Labs:    Latest Ref Rng & Units 02/04/2023    1:33 PM 01/03/2023    3:37 PM 11/23/2022    3:19 PM  BMP  Glucose 70 - 99 mg/dL 880  898  97   BUN 8 - 27 mg/dL 35  68  42   Creatinine 0.57 - 1.00 mg/dL 8.57  8.16  8.34   BUN/Creat Ratio 12 - 28 25   25    Sodium 134 - 144 mmol/L 134  134  142   Potassium 3.5 - 5.2 mmol/L 3.9  3.6  4.8   Chloride 96 - 106 mmol/L 93  93  102   CO2 20 - 29 mmol/L 27  26  26    Calcium 8.7 - 10.3 mg/dL 9.4  9.3  9.2         Latest Ref Rng & Units 10/25/2022    4:30 PM 05/31/2022    3:54 PM 04/30/2022   12:11 PM  Hepatic Function  Total Protein 6.0 - 8.5 g/dL 7.7  6.8  7.3   Albumin 3.7 - 4.7 g/dL 4.1  3.8  3.9   AST 0 - 40 IU/L 17  13  15    ALT 0 - 32 IU/L 8  8  8    Alk Phosphatase 44 - 121 IU/L 81  66  57   Total Bilirubin 0.0 - 1.2 mg/dL 0.3  0.2  0.3        Latest Ref Rng & Units 10/25/2022    4:30 PM 05/21/2022    4:56 PM 04/30/2022   12:11 PM  CBC  WBC 3.4 - 10.8 x10E3/uL 13.2  9.7  9.5   Hemoglobin 11.1 - 15.9 g/dL 89.4  89.5  89.0   Hematocrit 34.0 - 46.6 % 32.5  30.3  32.5   Platelets 150 - 450 x10E3/uL 263  278  245.0    Lab Results  Component Value Date   MCV 89 10/25/2022   MCV 87.8 05/21/2022   MCV 89.4 04/30/2022   Lab Results  Component Value Date   TSH 3.166 Test methodology  is 3rd generation TSH 11/24/2007   Lab Results  Component Value Date   HGBA1C 6.2 01/03/2023     BNP    Component Value Date/Time   BNP 34.9 02/04/2023 1333   BNP 23.5 07/13/2021 1000    ProBNP    Component Value Date/Time   PROBNP 480 01/03/2023  1537   PROBNP 86.0 04/30/2022 1211     Lipid Panel     Component Value Date/Time   CHOL 134 04/30/2022 1211   TRIG 100.0 04/30/2022 1211   HDL 54.80 04/30/2022 1211   CHOLHDL 2 04/30/2022 1211   VLDL 20.0 04/30/2022 1211   LDLCALC 59 04/30/2022 1211     RADIOLOGY: No results found.  Echo: 07/15/2021  1. Left ventricular ejection fraction, by estimation, is 60 to 65%. The  left ventricle has normal function. The left ventricle has no regional  wall motion abnormalities. There is mild left ventricular hypertrophy.  Left ventricular diastolic parameters  are consistent with Grade I diastolic dysfunction (impaired relaxation).   2. Right ventricular systolic function is normal. The right ventricular  size is normal. The estimated right ventricular systolic pressure is 31.5  mmHg.   3. Left atrial size was mildly dilated.   4. The mitral valve is normal in structure. Trivial mitral valve  regurgitation. No evidence of mitral stenosis.   5. The aortic valve is tricuspid. Aortic valve regurgitation is not  visualized. No aortic stenosis is present.   6. The inferior vena cava is normal in size with greater than 50%  respiratory variability, suggesting right atrial pressure of 3 mmHg.    ASSESSMENT:    1. PAT (paroxysmal atrial tachycardia) (HCC)     PLAN:  1.  Essential hypertension: Her blood pressure is controlled on her current regimen consisting of diltiazem  120 mg, torsemide  40 mg, and valsartan  160 mg daily.   2.  Lower extremity edema/venous insufficiency: Prior evaluation she continued to have significant lower extremity edema at which time furosemide  was discontinued and she was transition to torsemide  with improved edema.  January 2023 lower extremity venous Doppler was negative for DVT.  Now is on reduced dose of torsemide .    3.  Underlying complete heart block/status post permanent pacemaker: Initial pacemaker insertion June 2009 with generator change out August  2018.  She is followed by Dr. Francyne last seen on January 06, 2023.  At his last evaluation, she was approximately 50% atrially pacing.  He reached out to device clinic and pacemaker representative cane a more reliable pattern of remote downloads.  4. Atypical chest pain: Remotely she had experience nonexertional chest pain.  Currently asymptomatic  5. Hyperlipidemia: She continues to be on pravastatin  40 mg.  Laboratory in July 2021 showed total cholesterol 121, LDL cholesterol 64 with triglycerides 94.  She now sees Dr. Lavern Heck for primary care.  Lipid studies on April 30, 2022 showed total cholesterol 134, LDL 59 triglycerides 100 and HDL 54.  She continues to be on pravastatin  40 mg daily.  She is fasting today and I will check comprehensive metabolic panel, CBC, TSH and lipid panel.  6.  History of PAF: She has not had recurrent AF and has been maintaining sinus rhythm.  7.  GERD: Controlled with pantoprazole   8.  Hypothyroidism: currently on levothyroxine  50 mcg daily   I discussed my plans for retirement at the end of June 2025.  Since she is already followed by Dr. Francyne for pacemaker evaluation I have recommended that she use  Dr. Francyne for her cardiology care as well as continued pacemaker follow-up.  Medication Adjustments/Labs and Tests Ordered: Current medicines are reviewed at length with the patient today.  Concerns regarding medicines are outlined above.  Medication changes, Labs and Tests ordered today are listed in the Patient Instructions below. There are no Patient Instructions on file for this visit.   Signed, Debby Sor, MD  07/28/2023 8:35 AM    [facility] [address], [place], [place]  [address] Phone: [phone]

## 2023-07-28 NOTE — Patient Instructions (Signed)
 Medication Instructions:  No medication changes were made during today's visit  *If you need a refill on your cardiac medications before your next appointment, please call your pharmacy*   Lab Work: Labs will be drawn today If you have labs (blood work) drawn today and your tests are completely normal, you will receive your results only by: MyChart Message (if you have MyChart) OR A paper copy in the mail If you have any lab test that is abnormal or we need to change your treatment, we will call you to review the results.   Testing/Procedures: No procedures ordered today.    Follow-Up: At [facility], you and your health needs are our priority.  As part of our continuing mission to provide you with exceptional heart care, we have created designated Provider Care Teams.  These Care Teams include your primary Cardiologist (physician) and Advanced Practice Providers (APPs -  Physician Assistants and Nurse Practitioners) who all work together to provide you with the care you need, when you need it.  We recommend signing up for the patient portal called MyChart.  Sign up information is provided on this After Visit Summary.  MyChart is used to connect with patients for Virtual Visits (Telemedicine).  Patients are able to view lab/test results, encounter notes, upcoming appointments, etc.  Non-urgent messages can be sent to your provider as well.   To learn more about what you can do with MyChart, go to [email or web address].    Your next appointment:   Keep scheduled appointment with:  Provider:   Dr. Jerel Balding    Other Instructions Thank you for choosing [facility]!

## 2023-07-29 ENCOUNTER — Encounter: Payer: Self-pay | Admitting: Cardiovascular Disease

## 2023-07-29 LAB — LIPID PANEL
Chol/HDL Ratio: 2.3 {ratio} (ref 0.0–4.4)
Cholesterol, Total: 138 mg/dL (ref 100–199)
HDL: 59 mg/dL (ref >39)
LDL Chol Calc (NIH): 64 mg/dL (ref 0–99)
Triglycerides: 79 mg/dL (ref 0–149)
VLDL Cholesterol Cal: 15 mg/dL (ref 5–40)

## 2023-07-29 LAB — CBC
Hematocrit: 33 % — ABNORMAL LOW (ref 34.0–46.6)
Hemoglobin: 10.8 g/dL — ABNORMAL LOW (ref 11.1–15.9)
MCH: 29.2 pg (ref 26.6–33.0)
MCHC: 32.7 g/dL (ref 31.5–35.7)
MCV: 89 fL (ref 79–97)
Platelets: 238 10*3/uL (ref 150–450)
RBC: 3.7 x10E6/uL — ABNORMAL LOW (ref 3.77–5.28)
RDW: 12.1 % (ref 11.7–15.4)
WBC: 8 10*3/uL (ref 3.4–10.8)

## 2023-07-29 LAB — COMPREHENSIVE METABOLIC PANEL
ALT: 9 [IU]/L (ref 0–32)
AST: 18 [IU]/L (ref 0–40)
Albumin: 4.1 g/dL (ref 3.7–4.7)
Alkaline Phosphatase: 76 [IU]/L (ref 44–121)
BUN/Creatinine Ratio: 19 (ref 12–28)
BUN: 24 mg/dL (ref 8–27)
Bilirubin Total: 0.3 mg/dL (ref 0.0–1.2)
CO2: 25 mmol/L (ref 20–29)
Calcium: 9.2 mg/dL (ref 8.7–10.3)
Chloride: 102 mmol/L (ref 96–106)
Creatinine, Ser: 1.28 mg/dL — ABNORMAL HIGH (ref 0.57–1.00)
Globulin, Total: 3.1 g/dL (ref 1.5–4.5)
Glucose: 99 mg/dL (ref 70–99)
Potassium: 4.6 mmol/L (ref 3.5–5.2)
Sodium: 141 mmol/L (ref 134–144)
Total Protein: 7.2 g/dL (ref 6.0–8.5)
eGFR: 40 mL/min/{1.73_m2} — ABNORMAL LOW (ref >59)

## 2023-07-29 LAB — TSH: TSH: 2.28 u[IU]/mL (ref 0.450–4.500)

## 2023-08-04 ENCOUNTER — Other Ambulatory Visit: Payer: Self-pay | Admitting: Cardiovascular Disease

## 2023-08-11 ENCOUNTER — Encounter (HOSPITAL_BASED_OUTPATIENT_CLINIC_OR_DEPARTMENT_OTHER): Payer: Medicare Other | Admitting: Internal Medicine

## 2023-08-15 ENCOUNTER — Ambulatory Visit (INDEPENDENT_AMBULATORY_CARE_PROVIDER_SITE_OTHER): Payer: Medicare Other | Admitting: Family Medicine

## 2023-08-15 ENCOUNTER — Encounter: Payer: Self-pay | Admitting: Family Medicine

## 2023-08-15 VITALS — BP 135/63 | HR 78 | Temp 98.3°F | Ht 59.0 in | Wt 141.8 lb

## 2023-08-15 DIAGNOSIS — Z950 Presence of cardiac pacemaker: Secondary | ICD-10-CM

## 2023-08-15 DIAGNOSIS — E782 Mixed hyperlipidemia: Secondary | ICD-10-CM

## 2023-08-15 DIAGNOSIS — I10 Essential (primary) hypertension: Secondary | ICD-10-CM | POA: Diagnosis not present

## 2023-08-15 DIAGNOSIS — I442 Atrioventricular block, complete: Secondary | ICD-10-CM

## 2023-08-15 DIAGNOSIS — R7301 Impaired fasting glucose: Secondary | ICD-10-CM | POA: Diagnosis not present

## 2023-08-15 DIAGNOSIS — E039 Hypothyroidism, unspecified: Secondary | ICD-10-CM | POA: Diagnosis not present

## 2023-08-15 DIAGNOSIS — K219 Gastro-esophageal reflux disease without esophagitis: Secondary | ICD-10-CM | POA: Diagnosis not present

## 2023-08-15 DIAGNOSIS — I495 Sick sinus syndrome: Secondary | ICD-10-CM

## 2023-08-15 DIAGNOSIS — I5032 Chronic diastolic (congestive) heart failure: Secondary | ICD-10-CM | POA: Diagnosis not present

## 2023-08-15 DIAGNOSIS — N1831 Chronic kidney disease, stage 3a: Secondary | ICD-10-CM

## 2023-08-15 NOTE — Progress Notes (Signed)
 Subjective  Chief Complaint  Patient presents with   Annual Exam    Pt here for Annual Exam and is currently fasting    Hypertension    HPI: [name] is a 89 y.o. female who presents to [facility] [place] today for a Female Wellness Visit. She also has the concerns and/or needs as listed above in the chief complaint. These will be addressed in addition to the Health Maintenance Visit.   Chronic disease f/u and/or acute problem visit: (deemed necessary to be done in addition to the wellness visit): Discussed the use of AI scribe software for clinical note transcription with the patient, who gave verbal consent to proceed.  History of Present Illness   [name] is an 89 year old female who presents with her son for follow up. She has chf, h/o CHB w/ a pacer, IFG, HLD, HTN and CKD. Has chronic LE and h/o venous stasis dermatitis. Anemia of chronic disease andLPR on chronic ppi. She takes levothyroxine for her hypothyroidism.   Overall she is doing well.  She has been stable.  Reviewed recent cardiology notes.  She had full blood work panel showing LDL at goal, TSH at goal, chronic kidney disease is stable if not a little bit better.  Chronic anemia is stable and normal liver function.  Blood pressure has been well-controlled on her current medications.  Her breathing is good.  She feels fatigued and has dry symptoms that bother her.  Her appetite is good.  Son reports that she is doing well overall.  She uses a walker to ambulate.  No recent falls.  No memory concerns    Assessment  1. CHB (complete heart block) (HCC) s/p pacemaker   2. Chronic diastolic heart failure (HCC)   3. Essential hypertension   4. Impaired fasting glucose   5. Mixed hyperlipidemia   6. Pacemaker   7. SSS (sick sinus syndrome) (HCC)   8. Stage 3a chronic kidney disease (CKD) (HCC)   9. Laryngopharyngeal reflux (LPR)   10. Primary hypothyroidism      Plan   Chronic disease  management visit and/or acute problem visit: Chronic medical problems are well-controlled as listed above.  I made no changes to medications and I believe the current doses are correct.  Lab work is reviewed again with patient.  All stable.  Continue healthy diet, recommend using Systane for dry eyes to see if this will help her and allow her to read which she likes to do. Follow up: 6 months for recheck No orders of the defined types were placed in this encounter.  No orders of the defined types were placed in this encounter.     Body mass index is 28.64 kg/m. Wt Readings from Last 3 Encounters:  08/15/23 141 lb 12.8 oz (64.3 kg)  07/28/23 147 lb 9.6 oz (67 kg)  07/20/23 142 lb (64.4 kg)     Patient Active Problem List   Diagnosis Date Noted Date Diagnosed   Chronic diastolic heart failure (HCC) 03/03/2022     Priority: High    Grade 1 echo 06/2021    Stage 3a chronic kidney disease (CKD) (HCC) 04/27/2021     Priority: High   SSS (sick sinus syndrome) (HCC) 06/15/2017     Priority: High   PAT (paroxysmal atrial tachycardia) (HCC) 06/15/2017     Priority: High   Impaired fasting glucose 07/27/2016     Priority: High   CHB (complete heart block) (HCC) s/p  pacemaker 05/17/2013     Priority: High   Pacemaker 02/04/2013     Priority: High    [employer] [place] model 1610 implanted June 2009, complete heart block, pacemaker dependent    Essential hypertension 02/04/2013     Priority: High   Mixed hyperlipidemia 02/04/2013     Priority: High   Osteopenia 04/27/2021     Priority: Medium    Adjustment disorder with anxiety 04/27/2021     Priority: Medium     Intermittent use of lorazepam    Early stage nonexudative age-related macular degeneration of right eye 03/06/2020     Priority: Medium    Early stage nonexudative age-related macular degeneration of left eye 03/06/2020     Priority: Medium    Laryngopharyngeal reflux (LPR) 03/11/2017     Priority: Medium    Primary  hypothyroidism 07/27/2016     Priority: Medium    Lower extremity edema 11/22/2014     Priority: Medium    Asthma 02/20/2010     Priority: Medium     PFT 02/11/2010: FEV1 1.39/92%, FEV1/FVC 0.74 small airway flows improved 39% with bronchodilator, TLC 0.87, DLCO 0.64 PFT 12/02/2015-minimal obstruction, minimal restriction, minimal diffusion reduction, insignificant response to bronchodilator although small airway flows improved.    GERD (gastroesophageal reflux disease) 10/26/2007     Priority: Medium    Chronic cough 10/26/2007     Priority: Medium     PFT 02/11/2010 FEV1 1.39/92%, FEV1/FVC 0.74, small airway flows improved 39% bronchodilator, TLC 0.87, DLCO 0.64 Cyclical cough aggravated by reflux with asthma component PFT 12/02/2015-minimal obstruction, minimal restriction, minimal diffusion reduction, insignificant response to bronchodilator although small airway flows improved. Methacholine challenge test 01/30/2016-positive for hyperreactive airways    Choroidal nevus of right eye 03/06/2020     Priority: Low   Pseudophakia 03/06/2020     Priority: Low   Vitamin D deficiency 07/27/2016     Priority: Low   Allergic rhinitis due to pollen 08/21/2010     Priority: Low    Allergy vaccine 1:50,000 03/24/2005; 1:500 04/27/2010 [facility]. Holding at 1:500, both vials, because of local reactions. DC'd 04/27/11/restarted 2013. Allergy profile 02/23/2011 IgE 14.1      Dyslipidemia 01/04/2023    Depression, major, single episode, mild (HCC) 01/03/2023    Venous insufficiency of both lower extremities 05/31/2022    Primary insomnia 10/28/2021    Normocytic anemia 07/14/2021    Health Maintenance  Topic Date Due   COVID-19 Vaccine (6 - 2024-25 season) 08/31/2023 (Originally 02/20/2023)   Medicare Annual Wellness (AWV)  12/13/2023   Pneumonia Vaccine 49+ Years old  Completed   INFLUENZA VACCINE  Completed   DEXA SCAN  Completed   Zoster Vaccines- Shingrix  Completed   HPV VACCINES  Aged Out    DTaP/Tdap/Td  Discontinued   Immunization History  Administered Date(s) Administered   Fluad Quad(high Dose 65+) 02/27/2019, 03/03/2022   Influenza Split 02/19/2009, 02/17/2011, 03/09/2012, 04/03/2012, 03/21/2013   Influenza Whole 02/19/2009, 02/17/2011   Influenza, High Dose Seasonal PF 03/08/2016, 03/09/2017, 03/23/2018, 02/27/2019, 03/26/2023   Influenza,inj,Quad PF,6+ Mos 03/11/2014, 03/13/2015   Influenza,inj,quad, With Preservative 03/05/2020   Influenza-Unspecified 04/03/2012, 03/21/2013, 03/11/2014, 03/13/2015, 03/03/2016, 03/05/2020   Moderna Sars-Covid-2 Vaccination 10/12/2019, 11/03/2019   PFIZER(Purple Top)SARS-COV-2 Vaccination 10/13/2019, 11/03/2019, 05/10/2020   Pneumococcal Conjugate-13 03/21/2014, 03/14/2018   Pneumococcal Polysaccharide-23 06/21/1993, 04/22/1999, 04/21/2004, 06/22/2007   Pneumococcal-Unspecified 06/22/2007, 03/14/2018   Zoster Recombinant(Shingrix) 08/23/2017, 11/03/2017   Zoster, Live 07/22/2006   Zoster, Unspecified 08/23/2017, 11/03/2017   We updated and  reviewed the patient's past history in detail and it is documented below. Allergies: Patient is allergic to fluticasone-salmeterol, sulfa antibiotics, sulfonamide derivatives, tetracycline hcl, benzonatate, erythromycin, meloxicam, and sulfamethoxazole. Past Medical History Patient  has a past medical history of Allergic rhinitis, Anginal pain (HCC), Asthmatic bronchitis, Chronic cough, Dysrhythmia, Esophageal reflux, GERD (gastroesophageal reflux disease), History of stress test (11/20/2009), echocardiogram (11/05/2010), Hyperlipemia, Hypertension, Hypothyroidism, PAF (paroxysmal atrial fibrillation) (HCC), and Pneumonia. Past Surgical History Patient  has a past surgical history that includes Pacemaker insertion (June 1st 2009); Foot surgery; Thyroid surgery; Vesicovaginal fistula closure w/ TAH; Cholecystectomy; Tonsillectomy; Nasal septum surgery; PPM GENERATOR CHANGEOUT (N/A, 02/09/2017);  Tonsillectomy; and Cystoscopy with biopsy (N/A, 08/29/2020). Family History: Patient family history includes Cancer in her father; Stroke in her mother and sister. Social History:  Patient  reports that she has never smoked. She has never used smokeless tobacco. She reports that she does not drink alcohol and does not use drugs.  Review of Systems: Constitutional: negative for fever or malaise Ophthalmic: negative for photophobia, double vision or loss of vision Cardiovascular: negative for chest pain, dyspnea on exertion, or new LE swelling Respiratory: negative for SOB or persistent cough Gastrointestinal: negative for abdominal pain, change in bowel habits or melena Genitourinary: negative for dysuria or gross hematuria, no abnormal uterine bleeding or disharge Musculoskeletal: negative for new gait disturbance or muscular weakness Integumentary: negative for new or persistent rashes, no breast lumps Neurological: negative for TIA or stroke symptoms Psychiatric: negative for SI or delusions Allergic/Immunologic: negative for hives  Patient Care Team    Relationship Specialty Notifications Start End  Willow Ora, MD PCP - General Family Medicine  04/27/21   Lennette Bihari, MD PCP - Cardiology Cardiology  03/06/19   Thurmon Fair, MD Consulting Physician Cardiology  06/10/17     Objective  Vitals: BP 135/63   Pulse 78   Temp 98.3 F (36.8 C)   Ht 4\' 11"  (1.499 m)   Wt 141 lb 12.8 oz (64.3 kg)   SpO2 96%   BMI 28.64 kg/m  General:  Well developed, well nourished, no acute distress, looks good Psych:  Alert and orientedx3,normal mood and affect HEENT:  Normocephalic, atraumatic, non-icteric sclera,  supple neck without adenopathy, mass or thyromegaly Cardiovascular:  Normal S1, S2, RRR without gallop, Respiratory:  Good breath sounds bilaterally, CTAB with normal respiratory effort Gastrointestinal: normal bowel sounds, soft, non-tender MSK: Wearing compression  stockings  No visits with results within 1 Day(s) from this visit.  Latest known visit with results is:  Office Visit on 07/28/2023  Component Date Value Ref Range Status   WBC 07/28/2023 8.0  3.4 - 10.8 x10E3/uL Final   RBC 07/28/2023 3.70 (L)  3.77 - 5.28 x10E6/uL Final   Hemoglobin 07/28/2023 10.8 (L)  11.1 - 15.9 g/dL Final   Hematocrit 47/42/5956 33.0 (L)  34.0 - 46.6 % Final   MCV 07/28/2023 89  79 - 97 fL Final   MCH 07/28/2023 29.2  26.6 - 33.0 pg Final   MCHC 07/28/2023 32.7  31.5 - 35.7 g/dL Final   RDW 38/75/6433 12.1  11.7 - 15.4 % Final   Platelets 07/28/2023 238  150 - 450 x10E3/uL Final   Glucose 07/28/2023 99  70 - 99 mg/dL Final   BUN 29/51/8841 24  8 - 27 mg/dL Final   Creatinine, Ser 07/28/2023 1.28 (H)  0.57 - 1.00 mg/dL Final   eGFR 66/11/3014 40 (L)  >59 mL/min/1.73 Final   BUN/Creatinine Ratio 07/28/2023 19  12 - 28 Final   Sodium 07/28/2023 141  134 - 144 mmol/L Final   Potassium 07/28/2023 4.6  3.5 - 5.2 mmol/L Final   Chloride 07/28/2023 102  96 - 106 mmol/L Final   CO2 07/28/2023 25  20 - 29 mmol/L Final   Calcium 07/28/2023 9.2  8.7 - 10.3 mg/dL Final   Total Protein 16/03/9603 7.2  6.0 - 8.5 g/dL Final   Albumin 54/02/8118 4.1  3.7 - 4.7 g/dL Final   Globulin, Total 07/28/2023 3.1  1.5 - 4.5 g/dL Final   Bilirubin Total 07/28/2023 0.3  0.0 - 1.2 mg/dL Final   Alkaline Phosphatase 07/28/2023 76  44 - 121 IU/L Final   AST 07/28/2023 18  0 - 40 IU/L Final   ALT 07/28/2023 9  0 - 32 IU/L Final   Cholesterol, Total 07/28/2023 138  100 - 199 mg/dL Final   Triglycerides 14/78/2956 79  0 - 149 mg/dL Final   HDL 21/30/8657 59  >39 mg/dL Final   VLDL Cholesterol Cal 07/28/2023 15  5 - 40 mg/dL Final   LDL Chol Calc (NIH) 07/28/2023 64  0 - 99 mg/dL Final   Chol/HDL Ratio 07/28/2023 2.3  0.0 - 4.4 ratio Final   TSH 07/28/2023 2.280  0.450 - 4.500 uIU/mL Final    Commons side effects, risks, benefits, and alternatives for medications and treatment plan  prescribed today were discussed, and the patient expressed understanding of the given instructions. Patient is instructed to call or message via MyChart if he/she has any questions or concerns regarding our treatment plan. No barriers to understanding were identified. We discussed Red Flag symptoms and signs in detail. Patient expressed understanding regarding what to do in case of urgent or emergency type symptoms.  Medication list was reconciled, printed and provided to the patient in AVS. Patient instructions and summary information was reviewed with the patient as documented in the AVS. This note was prepared with assistance of Dragon voice recognition software. Occasional wrong-word or sound-a-like substitutions may have occurred due to the inherent limitations of voice recognition software

## 2023-08-21 ENCOUNTER — Other Ambulatory Visit: Payer: Self-pay | Admitting: Cardiovascular Disease

## 2023-09-08 ENCOUNTER — Encounter: Payer: Self-pay | Admitting: Cardiovascular Disease

## 2023-09-08 ENCOUNTER — Ambulatory Visit: Payer: Medicare Other | Attending: Cardiovascular Disease | Admitting: Cardiovascular Disease

## 2023-09-08 VITALS — BP 136/50 | HR 80 | Ht 59.0 in | Wt 144.4 lb

## 2023-09-08 DIAGNOSIS — Z950 Presence of cardiac pacemaker: Secondary | ICD-10-CM | POA: Insufficient documentation

## 2023-09-08 DIAGNOSIS — R7303 Prediabetes: Secondary | ICD-10-CM | POA: Diagnosis not present

## 2023-09-08 DIAGNOSIS — N1832 Chronic kidney disease, stage 3b: Secondary | ICD-10-CM | POA: Diagnosis not present

## 2023-09-08 DIAGNOSIS — Z8679 Personal history of other diseases of the circulatory system: Secondary | ICD-10-CM | POA: Diagnosis not present

## 2023-09-08 DIAGNOSIS — I495 Sick sinus syndrome: Secondary | ICD-10-CM | POA: Insufficient documentation

## 2023-09-08 DIAGNOSIS — I10 Essential (primary) hypertension: Secondary | ICD-10-CM | POA: Diagnosis not present

## 2023-09-08 DIAGNOSIS — I442 Atrioventricular block, complete: Secondary | ICD-10-CM | POA: Insufficient documentation

## 2023-09-08 DIAGNOSIS — I5032 Chronic diastolic (congestive) heart failure: Secondary | ICD-10-CM | POA: Insufficient documentation

## 2023-09-08 DIAGNOSIS — E7800 Pure hypercholesterolemia, unspecified: Secondary | ICD-10-CM | POA: Insufficient documentation

## 2023-09-08 DIAGNOSIS — I4719 Other supraventricular tachycardia: Secondary | ICD-10-CM | POA: Diagnosis not present

## 2023-09-08 NOTE — Progress Notes (Signed)
Patient ID: [name], female   DOB: [birth year], 89 y.o.   MRN: [id number]    Cardiology Office Note    Date:  09/11/2023   ID:  [name], DOB April 26, 1932, MRN [id number]  PCP:  Willow Ora, MD  Cardiologist: Nicki Guadalajara, M.D.;  Thurmon Fair, MD   No chief complaint on file.   History of Present Illness:  [name] is a 89 y.o. female who returns in follow-up for sinus node dysfunction, complete heart block, paroxysmal atrial tachycardia and pacemaker.  Although her current pacemaker generator is MRI conditional, she still has original leads from 2009 which are not MRI compatible.  She will be celebrating her 89th birthday this year.  She has been unable to do remote downloads despite multiple attempts.  Checking her pacemaker in the office every 6 months.  She remains fairly sedentary but has no complaints of shortness of breath or chest pain with her level of activity.  Denies palpitations, dizziness or syncope, lower extremity edema, orthopnea, PND, falls or bleeding problems.  She walks with a walker and has a variety of joint complaints.    She is generally doing well since her last appointment from a cardiovascular point of view.  She has not had any falls or serious dizziness, but has noticed that her diastolic blood pressure is often very low as it is today at 48 mmHg.  She is quite sedentary.  She complains of sleep issues.  She has not had palpitations.  She denies lower extremity edema, orthopnea, PND.  She does not have chest pain at rest or with activity.  She uses a walker.  She has a variety of musculoskeletal aches and pains.  Diuretics were started 2023 when she had complaints of intermittent lower extremity edema, at a time when she was also found to have bilateral superior pubic rami fractures.  She had borderline elevated NT proBNP in the past at 833, subsequently recheck normal at 515, then even lower at 480 and then BNP 35 in August 2024.  Echocardiogram in  June 2023 showed normal left ventricular systolic function and no significant valvular abnormalities. Feels a little stronger after we cut back on her dose of diuretic at her visit in July 2024 and has not had any recurrence of edema.  Her diastolic blood pressure remains quite low (today 136/50).  Comprehensive pacemaker interrogation in the office today shows normal device function.  The presenting rhythm is atrial paced, ventricular sensed.  She has a Forensic psychologist device implanted in 2018 that has roughly 2.6 years remaining battery longevity.  All lead parameters (sensing, impedance, capture threshold) are actively checked today and are in stable and acceptable range.  She has 36% atrial pacing and 100% ventricular pacing.  There is no ventricular escape rhythm when pacing is taken down to 30 bpm.  She has not had any episodes of high ventricular rate.  She has had a handful of episodes of mode switch, once or twice a month, they all appear to be paroxysmal atrial tachycardia and the majority are nonsustained (under 15 seconds), although she did have 1 episode that lasted for 3 minutes.  Had a device check in March 2023 she had 1 episode suspicious for atrial flutter (atrial rate 300 bpm) but no electrogram was recorded.  She has not had any episodes consistent with atrial flutter or atrial fibrillation since that time.  She has good metabolic control with LDL 64 and hemoglobin A1c 3.0% (diet controlled only). Creatinine  is moderately elevated at 1.28, but this is a big improvement compared to last year.  Baseline GFR appears to be approximately 40.     Past Medical History:  Diagnosis Date   Allergic rhinitis    Anginal pain (HCC)    Asthmatic bronchitis    Chronic cough    Dysrhythmia    PAF   Esophageal reflux    GERD (gastroesophageal reflux disease)    History of stress test 11/20/2009   This was essentially normal,post-stress EF was hyperdynamic at 73 beats per mins, There was no  scar or ischemia.   Hx of echocardiogram 11/05/2010   EF>55% showed a normal systolic function with impaired diastolic dysfuntion and also tissue doppler suggesting increased elevated left atrial pressure, she had moderate LA dilatation and mild LA dilatation. There was calcified mitral apparatus with moderate MR, mild to moderate TR and trace aortic insufficiency.   Hyperlipemia    Hypertension    Hypothyroidism    PAF (paroxysmal atrial fibrillation) (HCC)    Pneumonia     Past Surgical History:  Procedure Laterality Date   CHOLECYSTECTOMY     CYSTOSCOPY WITH BIOPSY N/A 08/29/2020   Procedure: CYSTOSCOPY WITH BIOPSY WITH FULGURATION;  Surgeon: Bjorn Pippin, MD;  Location: [facility];  Service: Urology;  Laterality: N/A;   FOOT SURGERY     right   NASAL SEPTUM SURGERY     PACEMAKER INSERTION  June 1st 2009   St Jude [place] device, model #1610, serial #[id number], The artial lead is a Lexicographer, the ventricular lead is Runner, broadcasting/film/video.   PPM GENERATOR CHANGEOUT N/A 02/09/2017   Procedure: PPM GENERATOR CHANGEOUT;  Surgeon: Thurmon Fair, MD;  Location: [facility];  Service: Cardiovascular;  Laterality: N/A;   THYROID SURGERY     TONSILLECTOMY     TONSILLECTOMY     VESICOVAGINAL FISTULA CLOSURE W/ TAH      Current Outpatient Medications  Medication Sig Dispense Refill   acetaminophen (TYLENOL) 500 MG tablet Take 500 mg by mouth as needed for mild pain.     Ascorbic Acid (VITAMIN C) 500 MG CHEW Chew 500 mg by mouth daily.     aspirin 81 MG chewable tablet Chew 81 mg by mouth at bedtime.     cetirizine (ZYRTEC) 10 MG tablet Take 10 mg by mouth as needed.     cholecalciferol (VITAMIN D3) 25 MCG (1000 UNIT) tablet Take 1 tablet (1,000 Units total) by mouth daily. 30 tablet 0   diltiazem (CARDIZEM CD) 120 MG 24 hr capsule TAKE 1 CAPSULE (120 MG TOTAL) BY MOUTH AT BEDTIME. 90 capsule 0   KLOR-CON M20 20 MEQ tablet TAKE 1 TABLET BY MOUTH DAILY. TAKE AN EXTRA TABLET ONCE DAILY WHEN YOU  TAKE THE EXTRA FUROSEMIDE 135 tablet 3   levothyroxine (SYNTHROID) 25 MCG tablet TAKE 1 TABLET BY MOUTH EVERY DAY 90 tablet 3   pantoprazole (PROTONIX) 20 MG tablet TAKE 1 TABLET BY MOUTH EVERY DAY 30 tablet 11   pravastatin (PRAVACHOL) 40 MG tablet TAKE 1 TABLET BY MOUTH EVERY DAY 90 tablet 3   torsemide (DEMADEX) 20 MG tablet TAKE 2 TABLETS (40 MG TOTAL) BY MOUTH IN THE MORNING AND 1 TABLET (20 MG TOTAL) EVERY EVENING. 270 tablet 1   valsartan (DIOVAN) 160 MG tablet Take 1 tablet (160 mg total) by mouth daily. 90 tablet 2   calcium carbonate (TUMS EX) 750 MG chewable tablet Chew 1 tablet by mouth daily as needed for heartburn. (Patient  not taking: Reported on 09/08/2023)     LORazepam (ATIVAN) 0.5 MG tablet TAKE 1 TABLET BY MOUTH EVERY DAY AS NEEDED FOR ANXIETY (Patient not taking: Reported on 09/08/2023) 30 tablet 2   traZODone (DESYREL) 50 MG tablet Take 0.5-1 tablets (25-50 mg total) by mouth at bedtime as needed for sleep. 30 tablet 5   No current facility-administered medications for this visit.    Allergies:   Fluticasone-salmeterol, Sulfa antibiotics, Sulfonamide derivatives, Tetracycline hcl, Benzonatate, Erythromycin, Meloxicam, and Sulfamethoxazole   Family History:  The patient's family history includes Cancer in her father; Stroke in her mother and sister.     PHYSICAL EXAM:   VS:  BP (!) 136/50 (BP Location: Left Arm, Patient Position: Sitting, Cuff Size: Normal)   Pulse 80   Ht 4\' 11"  (1.499 m)   Wt 144 lb 6.4 oz (65.5 kg)   SpO2 96%   BMI 29.17 kg/m       General: Alert, oriented x3, no distress, appears well.  Healthy with subclavian pacemaker site. Head: no evidence of trauma, PERRL, EOMI, no exophtalmos or lid lag, no myxedema, no xanthelasma; normal ears, nose and oropharynx Neck: normal jugular venous pulsations and no hepatojugular reflux; brisk carotid pulses without delay and no carotid bruits Chest: clear to auscultation, no signs of consolidation by  percussion or palpation, normal fremitus, symmetrical and full respiratory excursions Cardiovascular: normal position and quality of the apical impulse, regular rhythm, normal first and second heart sounds, no murmurs, rubs or gallops Abdomen: no tenderness or distention, no masses by palpation, no abnormal pulsatility or arterial bruits, normal bowel sounds, no hepatosplenomegaly Extremities: no clubbing, cyanosis or edema; 2+ radial, ulnar and brachial pulses bilaterally; 2+ right femoral, posterior tibial and dorsalis pedis pulses; 2+ left femoral, posterior tibial and dorsalis pedis pulses; no subclavian or femoral bruits Neurological: grossly nonfocal Psych: Normal mood and affect    Wt Readings from Last 3 Encounters:  09/08/23 144 lb 6.4 oz (65.5 kg)  08/15/23 141 lb 12.8 oz (64.3 kg)  07/28/23 147 lb 9.6 oz (67 kg)      Studies/Labs Reviewed:   ECHO 07/15/2021   1. Left ventricular ejection fraction, by estimation, is 60 to 65%. The left ventricle has normal function. The left ventricle has no regional wall motion abnormalities. There is mild left ventricular hypertrophy. Left ventricular diastolic parameters are consistent with Grade I diastolic dysfunction (impaired relaxation).   2. Right ventricular systolic function is normal. The right ventricular size is normal. The estimated right ventricular systolic pressure is 31.5 mmHg.   3. Left atrial size was mildly dilated.   4. The mitral valve is normal in structure. Trivial mitral valve  regurgitation. No evidence of mitral stenosis.   5. The aortic valve is tricuspid. Aortic valve regurgitation is not  visualized. No aortic stenosis is present.   6. The inferior vena cava is normal in size with greater than 50%  respiratory variability, suggesting right atrial pressure of 3 mmHg.   EKG: Most recent ECG from 07/28/2023 is personally reviewed and shows atrial sensed (sinus), ventricular paced rhythm  EKG  Interpretation Date/Time:    Ventricular Rate:    PR Interval:    QRS Duration:    QT Interval:    QTC Calculation:   R Axis:      Text Interpretation:           Recent Labs: 10/25/2022: Magnesium 2.3 01/03/2023: NT-Pro BNP 480 02/04/2023: BNP 34.9 07/28/2023: ALT 9; BUN 24; Creatinine, Ser  1.28; Hemoglobin 10.8; Platelets 238; Potassium 4.6; Sodium 141; TSH 2.280    Lipid Panel    Component Value Date/Time   CHOL 138 07/28/2023 0900   TRIG 79 07/28/2023 0900   HDL 59 07/28/2023 0900   CHOLHDL 2.3 07/28/2023 0900   CHOLHDL 2 04/30/2022 1211   VLDL 20.0 04/30/2022 1211   LDLCALC 64 07/28/2023 0900    ASSESSMENT:    1. Chronic diastolic heart failure (HCC)   2. CHB (complete heart block) (HCC)   3. SSS (sick sinus syndrome) (HCC)   4. Pacemaker   5. PAT (paroxysmal atrial tachycardia) (HCC)   6. History of atrial fibrillation   7. Essential hypertension   8. Hypercholesteremia   9. Prediabetes   10. Stage 3b chronic kidney disease (CKD) (HCC)          PLAN:  In order of problems listed above:  CHF: Appears clinically euvolemic on the current dose of loop diuretic.  We can probably cut that back further if necessary.  Appears to have excellent functional status, but of course she is quite sedentary.   CHB: Pacemaker dependent, with no underlying escape rhythm. SSS: Over the last few years atrial pacing has been between 40 and 50% most of the time with a good heart rate histogram distribution. PPM: Despite multiple attempts, we have not been able to secure a reliable way to perform remote downloads.  She actually brought her transmitter into the office today since it is not working.  We will perform in office pacemaker checks at least every 6 months, increasing to every 3 months in the last year of anticipated pacemaker longevity, may even have to do monthly downloads towards the end of the battery life since she is pacemaker dependent. PAT: Continues to have  infrequent and brief episodes of paroxysmal atrial tachycardia.  In the last 10 months the longest episode was about 3 minutes long.  In 2023 she had a longer episode suspicious of atrial flutter, but we could not confirm this is normal electrogram was recorded.  She is taking sustained-release diltiazem. AFib: Reported as part of her medical record in the remote past, but had not been seen during pacemaker checks for the last 10 years except for the single episode on 09/07/2021 with possible atrial flutter or atrial fibrillation that lasted for over 3 hours, but unfortunately electrogram cannot be reviewed.  With such a low burden of arrhythmia and with her approaching age 26, we have decided to continue monitoring.  Anticoagulation is not prescribed. HTN: Despite cutting back on her diuretics she continues to have a very low diastolic blood pressure.  Will keep the diltiazem for its antiarrhythmic properties, but if symptomatic we can always cut back on her valsartan. HLP: Excellent lipid profile on pravastatin.  Continue. PreDM: Keeping her hemoglobin A1c in acceptable range at 6.2% with diet only. CKD 3B: At her last appointment will reduce the diuretic to 40 mg once daily only, but I see that she is now back on 40 mg in the morning and 20 mg in the evening.  Her creatinine has improved and just a month ago was 1.28.  Creatinine of 1.2-1.4 appears to be her baseline, corresponding to a GFR of approximately 40   Medication Adjustments/Labs and Tests Ordered: Current medicines are reviewed at length with the patient today.  Concerns regarding medicines are outlined above.  Medication changes, Labs and Tests ordered today are listed below. Patient Instructions  Medication Instructions:  Your physician recommends that  you continue on your current medications as directed. Please refer to the Current Medication list given to you today.  Follow-Up: At [name] you and your health needs are  our priority.  As part of our continuing mission to provide you with exceptional heart care, we have created designated Provider Care Teams.  These Care Teams include your primary Cardiologist (physician) and Advanced Practice Providers (APPs -  Physician Assistants and Nurse Practitioners) who all work together to provide you with the care you need, when you need it.  We recommend signing up for the patient portal called "MyChart".  Sign up information is provided on this After Visit Summary.  MyChart is used to connect with patients for Virtual Visits (Telemedicine).  Patients are able to view lab/test results, encounter notes, upcoming appointments, etc.  Non-urgent messages can be sent to your provider as well.   To learn more about what you can do with MyChart, go to [email or web address].    Your next appointment:   6 month(s)  Provider:   Dr. Rubie Maid   Other Instructions   1st Floor: - Lobby - Registration  - Pharmacy  - Lab - Cafe  2nd Floor: - PV Lab - Diagnostic Testing (echo, CT, nuclear med)  3rd Floor: - Vacant  4th Floor: - TCTS (cardiothoracic surgery) - AFib Clinic - Structural Heart Clinic - Vascular Surgery  - Vascular Ultrasound  5th Floor: - HeartCare Cardiology (general and EP) - Clinical Pharmacy for coumadin, hypertension, lipid, weight-loss medications, and med management appointments    Valet parking services will be available as well.         Patient Instructions  Medication Instructions:  Your physician recommends that you continue on your current medications as directed. Please refer to the Current Medication list given to you today.  Follow-Up: At [name], you and your health needs are our priority.  As part of our continuing mission to provide you with exceptional heart care, we have created designated Provider Care Teams.  These Care Teams include your primary Cardiologist (physician) and Advanced Practice Providers  (APPs -  Physician Assistants and Nurse Practitioners) who all work together to provide you with the care you need, when you need it.  We recommend signing up for the patient portal called "MyChart".  Sign up information is provided on this After Visit Summary.  MyChart is used to connect with patients for Virtual Visits (Telemedicine).  Patients are able to view lab/test results, encounter notes, upcoming appointments, etc.  Non-urgent messages can be sent to your provider as well.   To learn more about what you can do with MyChart, go to [email or web address].    Your next appointment:   6 month(s)  Provider:   Dr. Rubie Maid   Other Instructions   1st Floor: - Lobby - Registration  - Pharmacy  - Lab - Cafe  2nd Floor: - PV Lab - Diagnostic Testing (echo, CT, nuclear med)  3rd Floor: - Vacant  4th Floor: - TCTS (cardiothoracic surgery) - AFib Clinic - Structural Heart Clinic - Vascular Surgery  - Vascular Ultrasound  5th Floor: - HeartCare Cardiology (general and EP) - Clinical Pharmacy for coumadin, hypertension, lipid, weight-loss medications, and med management appointments    Valet parking services will be available as well.        Signed, Thurmon Fair, MD  09/11/2023 2:09 PM    [facility] [address] [place], [place], [place]  [address] Phone: [phone]; Fax: [phone]   

## 2023-09-08 NOTE — Patient Instructions (Addendum)
 Medication Instructions:  Your physician recommends that you continue on your current medications as directed. Please refer to the Current Medication list given to you today.  Follow-Up: At [facility], you and your health needs are our priority.  As part of our continuing mission to provide you with exceptional heart care, we have created designated Provider Care Teams.  These Care Teams include your primary Cardiologist (physician) and Advanced Practice Providers (APPs -  Physician Assistants and Nurse Practitioners) who all work together to provide you with the care you need, when you need it.  We recommend signing up for the patient portal called "MyChart".  Sign up information is provided on this After Visit Summary.  MyChart is used to connect with patients for Virtual Visits (Telemedicine).  Patients are able to view lab/test results, encounter notes, upcoming appointments, etc.  Non-urgent messages can be sent to your provider as well.   To learn more about what you can do with MyChart, go to [email or web address].    Your next appointment:   6 month(s)  Provider:   Dr. Rubie Maid   Other Instructions   1st Floor: - Lobby - Registration  - Pharmacy  - Lab - Cafe  2nd Floor: - PV Lab - Diagnostic Testing (echo, CT, nuclear med)  3rd Floor: - Vacant  4th Floor: - TCTS (cardiothoracic surgery) - AFib Clinic - Structural Heart Clinic - Vascular Surgery  - Vascular Ultrasound  5th Floor: - HeartCare Cardiology (general and EP) - Clinical Pharmacy for coumadin, hypertension, lipid, weight-loss medications, and med management appointments    Valet parking services will be available as well.

## 2023-09-11 DIAGNOSIS — E7800 Pure hypercholesterolemia, unspecified: Secondary | ICD-10-CM | POA: Insufficient documentation

## 2023-09-11 DIAGNOSIS — R7303 Prediabetes: Secondary | ICD-10-CM | POA: Insufficient documentation

## 2023-09-13 ENCOUNTER — Encounter: Payer: Self-pay | Admitting: Cardiovascular Disease

## 2023-10-17 ENCOUNTER — Ambulatory Visit (INDEPENDENT_AMBULATORY_CARE_PROVIDER_SITE_OTHER): Payer: Medicare Other | Admitting: Podiatry

## 2023-10-17 ENCOUNTER — Encounter: Payer: Self-pay | Admitting: Podiatry

## 2023-10-17 VITALS — Ht 59.0 in | Wt 144.4 lb

## 2023-10-17 DIAGNOSIS — B351 Tinea unguium: Secondary | ICD-10-CM

## 2023-10-17 DIAGNOSIS — M79675 Pain in left toe(s): Secondary | ICD-10-CM | POA: Diagnosis not present

## 2023-10-17 DIAGNOSIS — M79674 Pain in right toe(s): Secondary | ICD-10-CM

## 2023-10-23 ENCOUNTER — Encounter: Payer: Self-pay | Admitting: Podiatry

## 2023-10-23 NOTE — Progress Notes (Signed)
  Subjective:  Patient ID: [name], female    DOB: 1931-12-31,  MRN: [id number]  89 y.o. female presents painful elongated mycotic toenails 1-5 bilaterally which are tender when wearing enclosed shoe gear. Pain is relieved with periodic professional debridement. She is accompanied by her son on today's visit. Chief Complaint  Patient presents with   Nail Problem    Pt is here for [facility] PCP is Dr Jonelle Neri and LOV was in February.   New problem(s): None   PCP is Luevenia Saha, MD.  Allergies  Allergen Reactions   Fluticasone -Salmeterol Other (See Comments)    unknown Other reaction(s): tongue/throat symptoms   Sulfa Antibiotics     Other reaction(s): gastric intol   Sulfonamide Derivatives Other (See Comments)    unknown   Tetracycline Hcl    Benzonatate  Other (See Comments)    tongue/throat symptoms in the past but tolerated more recently Other reaction(s): and tongue/throat symptoms in the past (not more recently)   Erythromycin Nausea And Vomiting and Other (See Comments)    Stomach cramps Other reaction(s): gastric intol   Meloxicam Other (See Comments)    Mild dyspnea Other reaction(s): dyspnea   Sulfamethoxazole Other (See Comments)    Gastric intolerance    Review of Systems: Negative except as noted in the HPI.   Objective:  [name] is a pleasant 89 y.o. female WD, WN in NAD. AAO x 3.  No change in physical examination on today's visit.  Vascular Examination: Vascular status intact b/l with palpable pedal pulses. CFT immediate b/l. Trace edema noted BLE.No pain with calf compression b/l. Skin temperature gradient WNL b/l.   Neurological Examination: Sensation grossly intact b/l with 10 gram monofilament.   Dermatological Examination: Pedal skin with normal turgor, texture and tone b/l. Toenails 1-5 b/l thick, discolored, elongated with subungual debris and pain on dorsal palpation. No hyperkeratotic lesions noted b/l.   Musculoskeletal  Examination: Muscle strength 5/5 to b/l LE. HAV with bunion deformity noted b/l LE.  Radiographs: None  Last A1c:      Latest Ref Rng & Units 01/03/2023    3:37 PM  Hemoglobin A1C  Hemoglobin-A1c 4.6 - 6.5 % 6.2      Assessment:   1. Pain due to onychomycosis of toenails of both feet    Plan:  Consent given for treatment. Patient examined. All patient's and/or POA's questions/concerns addressed on today's visit. Mycotic toenails 1-5 debrided in length and girth without incident. Continue soft, supportive shoe gear daily. Report any pedal injuries to medical professional. Call office if there are any quesitons/concerns. -Patient/POA to call should there be question/concern in the interim.  Return in about 3 months (around 01/16/2024).  [name], DPM      [facility] LOCATION: [address], [place] [address]                   Office [phone]   [facility] LOCATION: [address] [place], [place] [address] Office [phone]

## 2023-11-09 DIAGNOSIS — D3131 Benign neoplasm of right choroid: Secondary | ICD-10-CM | POA: Diagnosis not present

## 2023-11-09 DIAGNOSIS — H52203 Unspecified astigmatism, bilateral: Secondary | ICD-10-CM | POA: Diagnosis not present

## 2023-11-09 DIAGNOSIS — H04563 Stenosis of bilateral lacrimal punctum: Secondary | ICD-10-CM | POA: Diagnosis not present

## 2023-11-09 DIAGNOSIS — H43813 Vitreous degeneration, bilateral: Secondary | ICD-10-CM | POA: Diagnosis not present

## 2023-11-09 DIAGNOSIS — H04123 Dry eye syndrome of bilateral lacrimal glands: Secondary | ICD-10-CM | POA: Diagnosis not present

## 2023-11-09 DIAGNOSIS — H26492 Other secondary cataract, left eye: Secondary | ICD-10-CM | POA: Diagnosis not present

## 2023-11-19 ENCOUNTER — Other Ambulatory Visit: Payer: Self-pay | Admitting: Cardiovascular Disease

## 2023-11-21 ENCOUNTER — Other Ambulatory Visit: Payer: Self-pay

## 2023-11-21 MED ORDER — DILTIAZEM HCL ER COATED BEADS 120 MG PO CP24: 120.0000 mg | ORAL_CAPSULE | Freq: Every evening | ORAL | 1 refills | Status: DC

## 2023-12-13 ENCOUNTER — Telehealth: Payer: Self-pay

## 2023-12-13 NOTE — Telephone Encounter (Signed)
 Spoke with pt and I have ordered pt a new monitor and usb and will take 7-10 business days and pt is aware to call us  when she gets it

## 2023-12-19 ENCOUNTER — Ambulatory Visit (INDEPENDENT_AMBULATORY_CARE_PROVIDER_SITE_OTHER): Payer: Medicare Other

## 2023-12-19 VITALS — Ht 60.0 in | Wt 144.0 lb

## 2023-12-19 DIAGNOSIS — Z0000 Encounter for general adult medical examination without abnormal findings: Secondary | ICD-10-CM

## 2023-12-19 NOTE — Progress Notes (Signed)
 Subjective:   Jamisen Hawes is a 89 y.o. who presents for a Medicare Wellness preventive visit.  As a reminder, Annual Wellness Visits don't include a physical exam, and some assessments may be limited, especially if this visit is performed virtually. We may recommend an in-person follow-up visit with your provider if needed.  Visit Complete: Virtual I connected with  Dickey Maiden on 12/19/23 by a audio enabled telemedicine application and verified that I am speaking with the correct person using two identifiers.  Patient Location: Home  Provider Location: Office/Clinic  I discussed the limitations of evaluation and management by telemedicine. The patient expressed understanding and agreed to proceed.  Vital Signs: Because this visit was a virtual/telehealth visit, some criteria may be missing or patient reported. Any vitals not documented were not able to be obtained and vitals that have been documented are patient reported.  VideoDeclined- This patient declined Librarian, academic. Therefore the visit was completed with audio only.  Persons Participating in Visit: Patient.  AWV Questionnaire: No: Patient Medicare AWV questionnaire was not completed prior to this visit.  Cardiac Risk Factors include: advanced age (>31men, >62 women);dyslipidemia;hypertension     Objective:    Today's Vitals   12/19/23 1345  Weight: 144 lb (65.3 kg)  Height: 5' (1.524 m)   Body mass index is 28.12 kg/m.     12/19/2023    1:50 PM 12/13/2022    2:14 PM 10/27/2021    9:37 AM 07/17/2021   12:25 PM 07/14/2021   12:00 PM 08/26/2020    2:07 PM 02/09/2017   12:19 PM  Advanced Directives  Does Patient Have a Medical Advance Directive? Yes No No No No Yes No   Type of Estate agent of [place];Living will     Living will;Healthcare Power of Attorney   Copy of Healthcare Power of Attorney in Chart? No - copy requested        Would patient like  information on creating a medical advance directive?  No - Patient declined No - Patient declined No - Patient declined No - Patient declined  Yes (MAU/Ambulatory/Procedural Areas - Information given)      Data saved with a previous flowsheet row definition    Current Medications (verified) Outpatient Encounter Medications as of 12/19/2023  Medication Sig   acetaminophen  (TYLENOL ) 500 MG tablet Take 500 mg by mouth as needed for mild pain.   Ascorbic Acid (VITAMIN C) 500 MG CHEW Chew 500 mg by mouth daily.   aspirin  81 MG chewable tablet Chew 81 mg by mouth at bedtime.   calcium carbonate (TUMS EX) 750 MG chewable tablet Chew 1 tablet by mouth daily as needed for heartburn.   cholecalciferol (VITAMIN D3) 25 MCG (1000 UNIT) tablet Take 1 tablet (1,000 Units total) by mouth daily.   diltiazem  (CARDIZEM  CD) 120 MG 24 hr capsule Take 1 capsule (120 mg total) by mouth at bedtime.   KLOR-CON  M20 20 MEQ tablet TAKE 1 TABLET BY MOUTH DAILY. TAKE AN EXTRA TABLET ONCE DAILY WHEN YOU TAKE THE EXTRA FUROSEMIDE    levothyroxine  (SYNTHROID ) 25 MCG tablet TAKE 1 TABLET BY MOUTH EVERY DAY   LORazepam  (ATIVAN ) 0.5 MG tablet TAKE 1 TABLET BY MOUTH EVERY DAY AS NEEDED FOR ANXIETY   pantoprazole  (PROTONIX ) 20 MG tablet TAKE 1 TABLET BY MOUTH EVERY DAY   pravastatin  (PRAVACHOL ) 40 MG tablet TAKE 1 TABLET BY MOUTH EVERY DAY   torsemide  (DEMADEX ) 20 MG tablet TAKE 2 TABLETS (40 MG TOTAL)  BY MOUTH IN THE MORNING AND 1 TABLET (20 MG TOTAL) EVERY EVENING.   valsartan  (DIOVAN ) 160 MG tablet Take 1 tablet (160 mg total) by mouth daily.   cetirizine (ZYRTEC) 10 MG tablet Take 10 mg by mouth as needed. (Patient not taking: Reported on 12/19/2023)   No facility-administered encounter medications on file as of 12/19/2023.    Allergies (verified) Fluticasone -salmeterol, Sulfa antibiotics, Sulfonamide derivatives, Tetracycline hcl, Benzonatate , Erythromycin, Meloxicam, and Sulfamethoxazole   History: Past Medical History:   Diagnosis Date   Allergic rhinitis    Anginal pain (HCC)    Asthmatic bronchitis    Chronic cough    Dysrhythmia    PAF   Esophageal reflux    GERD (gastroesophageal reflux disease)    History of stress test 11/20/2009   This was essentially normal,post-stress EF was hyperdynamic at 73 beats per mins, There was no scar or ischemia.   Hx of echocardiogram 11/05/2010   EF>55% showed a normal systolic function with impaired diastolic dysfuntion and also tissue doppler suggesting increased elevated left atrial pressure, she had moderate LA dilatation and mild LA dilatation. There was calcified mitral apparatus with moderate MR, mild to moderate TR and trace aortic insufficiency.   Hyperlipemia    Hypertension    Hypothyroidism    PAF (paroxysmal atrial fibrillation) (HCC)    Pneumonia    Past Surgical History:  Procedure Laterality Date   CHOLECYSTECTOMY     CYSTOSCOPY WITH BIOPSY N/A 08/29/2020   Procedure: CYSTOSCOPY WITH BIOPSY WITH FULGURATION;  Surgeon: Watt Rush, MD;  Location: [facility];  Service: Urology;  Laterality: N/A;   FOOT SURGERY     right   NASAL SEPTUM SURGERY     PACEMAKER INSERTION  June 1st 2009   St Jude Napoleon device, model #4173, serial #[id number], The artial lead is a Lexicographer, the ventricular lead is Runner, broadcasting/film/video.   PPM GENERATOR CHANGEOUT N/A 02/09/2017   Procedure: PPM GENERATOR CHANGEOUT;  Surgeon: Francyne Headland, MD;  Location: [facility];  Service: Cardiovascular;  Laterality: N/A;   THYROID  SURGERY     TONSILLECTOMY     TONSILLECTOMY     VESICOVAGINAL FISTULA CLOSURE W/ TAH     Family History  Problem Relation Age of Onset   Stroke Mother    Cancer Father        bladder   Stroke Sister    Social History   Socioeconomic History   Marital status: Widowed    Spouse name: Not on file   Number of children: Not on file   Years of education: Not on file   Highest education level: Not on file  Occupational History    Employer:  RETIRED    Comment: Retired Heritage manager at [employer]  Tobacco Use   Smoking status: Never   Smokeless tobacco: Never  Vaping Use   Vaping status: Never Used  Substance and Sexual Activity   Alcohol use: No   Drug use: No   Sexual activity: Not Currently    Partners: Male    Comment: widowed  Other Topics Concern   Not on file  Social History Narrative   Not on file   Social Drivers of Health   Financial Resource Strain: Low Risk  (12/19/2023)   Overall Financial Resource Strain (CARDIA)    Difficulty of Paying Living Expenses: Not hard at all  Food Insecurity: No Food Insecurity (12/19/2023)   Hunger Vital Sign    Worried About Running Out of  Food in the Last Year: Never true    Ran Out of Food in the Last Year: Never true  Transportation Needs: No Transportation Needs (12/19/2023)   PRAPARE - Administrator, Civil Service (Medical): No    Lack of Transportation (Non-Medical): No  Physical Activity: Insufficiently Active (12/19/2023)   Exercise Vital Sign    Days of Exercise per Week: 5 days    Minutes of Exercise per Session: 10 min  Stress: No Stress Concern Present (12/19/2023)   [employer] of Occupational Health - Occupational Stress Questionnaire    Feeling of Stress: Not at all  Social Connections: Moderately Isolated (12/19/2023)   Social Connection and Isolation Panel    Frequency of Communication with Friends and Family: More than three times a week    Frequency of Social Gatherings with Friends and Family: More than three times a week    Attends Religious Services: More than 4 times per year    Active Member of [employer] or Organizations: No    Attends Banker Meetings: Never    Marital Status: Widowed    Tobacco Counseling Counseling given: Not Answered    Clinical Intake:  Pre-visit preparation completed: Yes  Pain : No/denies pain     BMI - recorded: 28.12 Nutritional Status: BMI 25 -29  Overweight Nutritional Risks: None Diabetes: No  Lab Results  Component Value Date   HGBA1C 6.2 01/03/2023   HGBA1C 6.1 04/30/2022   HGBA1C 5.4 04/27/2021     How often do you need to have someone help you when you read instructions, pamphlets, or other written materials from your doctor or pharmacy?: 1 - Never  Interpreter Needed?: No  Information entered by :: Ellouise Haws, LPN   Activities of Daily Living     12/19/2023    1:47 PM  In your present state of health, do you have any difficulty performing the following activities:  Hearing? 0  Vision? 0  Difficulty concentrating or making decisions? 0  Walking or climbing stairs? 0  Dressing or bathing? 0  Doing errands, shopping? 0  Preparing Food and eating ? Y  Comment family  Using the Toilet? N  In the past six months, have you accidently leaked urine? N  Do you have problems with loss of bowel control? N  Managing your Medications? N  Managing your Finances? N  Housekeeping or managing your Housekeeping? N    Patient Care Team: Jodie Lavern CROME, MD as PCP - General (Family Medicine) Burnard Debby LABOR, MD as PCP - Cardiology (Cardiology) Croitoru, Jerel, MD as Consulting Physician (Cardiology)  I have updated your Care Teams any recent Medical Services you may have received from other providers in the past year.     Assessment:   This is a routine wellness examination for Tatym.  Hearing/Vision screen Hearing Screening - Comments:: Pt denies any hearing issues  Vision Screening - Comments:: Wears rx glasses - up to date with routine eye exams with Dr Patrcia     Goals Addressed   None    Depression Screen     12/19/2023    1:48 PM 08/15/2023    3:58 PM 05/11/2023    2:14 PM 01/03/2023    2:59 PM 12/13/2022    2:12 PM 10/27/2022    2:35 PM 10/20/2022    1:38 PM  PHQ 2/9 Scores  PHQ - 2 Score 0 0 0 0 0 0 0    Fall Risk     12/19/2023  1:51 PM 08/15/2023    3:57 PM 05/11/2023    2:13 PM 01/03/2023     2:59 PM 12/13/2022    2:15 PM  Fall Risk   Falls in the past year? 0 0 0 0 0  Number falls in past yr: 0 0 0 0 0  Injury with Fall? 0 0 0 0 0  Risk for fall due to : Impaired balance/gait;Impaired mobility No Fall Risks No Fall Risks No Fall Risks Impaired vision;Impaired mobility  Follow up Falls prevention discussed Falls evaluation completed Falls evaluation completed Falls evaluation completed Falls prevention discussed    MEDICARE RISK AT HOME:  Medicare Risk at Home Any stairs in or around the home?: No If so, are there any without handrails?: No Home free of loose throw rugs in walkways, pet beds, electrical cords, etc?: Yes Adequate lighting in your home to reduce risk of falls?: Yes Life alert?: No Use of a cane, walker or w/c?: Yes Grab bars in the bathroom?: Yes Shower chair or bench in shower?: Yes Elevated toilet seat or a handicapped toilet?: No  TIMED UP AND GO:  Was the test performed?  No  Cognitive Function: 6CIT completed        12/19/2023    1:51 PM 12/13/2022    2:16 PM 10/27/2021    9:40 AM  6CIT Screen  What Year? 0 points 0 points 0 points  What month? 0 points 0 points 3 points  What time? 0 points 0 points 0 points  Count back from 20 0 points 0 points 0 points  Months in reverse 0 points 0 points 0 points  Repeat phrase 2 points 0 points 4 points  Total Score 2 points 0 points 7 points    Immunizations Immunization History  Administered Date(s) Administered   Fluad Quad(high Dose 65+) 02/27/2019, 03/03/2022   Fluzone Influenza virus vaccine,trivalent (IIV3), split virus 02/17/2011, 03/09/2012   Influenza Split 02/19/2009, 04/03/2012, 03/21/2013   Influenza Whole 02/19/2009, 02/17/2011   Influenza, High Dose Seasonal PF 03/08/2016, 03/09/2017, 03/23/2018, 02/27/2019, 03/26/2023   Influenza,inj,Quad PF,6+ Mos 03/11/2014, 03/13/2015   Influenza,inj,quad, With Preservative 03/05/2020   Influenza-Unspecified 04/03/2012, 03/21/2013, 03/11/2014,  03/13/2015, 03/03/2016, 03/05/2020   Moderna Sars-Covid-2 Vaccination 10/12/2019, 11/03/2019   PFIZER(Purple Top)SARS-COV-2 Vaccination 10/13/2019, 11/03/2019, 05/10/2020   Pneumococcal Conjugate-13 03/21/2014, 03/14/2018   Pneumococcal Polysaccharide-23 06/21/1993, 04/22/1999, 04/21/2004, 06/22/2007   Pneumococcal-Unspecified 06/22/2007, 03/14/2018   Zoster Recombinant(Shingrix) 08/23/2017, 11/03/2017   Zoster, Live 07/22/2006   Zoster, Unspecified 08/23/2017, 11/03/2017    Screening Tests Health Maintenance  Topic Date Due   COVID-19 Vaccine (6 - 2024-25 season) 02/20/2023   INFLUENZA VACCINE  01/20/2024   Medicare Annual Wellness (AWV)  12/18/2024   Pneumococcal Vaccine: 50+ Years  Completed   DEXA SCAN  Completed   Zoster Vaccines- Shingrix  Completed   Hepatitis B Vaccines  Aged Out   HPV VACCINES  Aged Out   Meningococcal B Vaccine  Aged Out   DTaP/Tdap/Td  Discontinued    Health Maintenance  Health Maintenance Due  Topic Date Due   COVID-19 Vaccine (6 - 2024-25 season) 02/20/2023   Health Maintenance Items Addressed: See Nurse Notes at the end of this note  Additional Screening:  Vision Screening: Recommended annual ophthalmology exams for early detection of glaucoma and other disorders of the eye. Would you like a referral to an eye doctor? No    Dental Screening: Recommended annual dental exams for proper oral hygiene  Community Resource Referral / Chronic Care Management: CRR required  this visit?  No   CCM required this visit?  No   Plan:    I have personally reviewed and noted the following in the patient's chart:   Medical and social history Use of alcohol, tobacco or illicit drugs  Current medications and supplements including opioid prescriptions. Patient is not currently taking opioid prescriptions. Functional ability and status Nutritional status Physical activity Advanced directives List of other physicians Hospitalizations, surgeries, and  ER visits in previous 12 months Vitals Screenings to include cognitive, depression, and falls Referrals and appointments  In addition, I have reviewed and discussed with patient certain preventive protocols, quality metrics, and best practice recommendations. A written personalized care plan for preventive services as well as general preventive health recommendations were provided to patient.   Ellouise VEAR Haws, LPN   3/[id number]/7974   After Visit Summary: (Declined) Due to this being a telephonic visit, with patients personalized plan was offered to patient but patient Declined AVS at this time   Notes: Nothing significant to report at this time.

## 2023-12-19 NOTE — Patient Instructions (Signed)
 [name] , Thank you for taking time out of your busy schedule to complete your Annual Wellness Visit with me. I enjoyed our conversation and look forward to speaking with you again next year. I, as well as your care team,  appreciate your ongoing commitment to your health goals. Please review the following plan we discussed and let me know if I can assist you in the future. Your Game plan/ To Do List    Referrals: If you haven't heard from the office you've been referred to, please reach out to them at the phone provided.   Follow up Visits: Next Medicare AWV with our clinical staff: 12/20/24   Have you seen your provider in the last 6 months (3 months if uncontrolled diabetes)? Yes Next Office Visit with your provider: 02/13/24  Clinician Recommendations:  Each day, aim for 6 glasses of water , plenty of protein in your diet and try to get up and walk/ stretch every hour for 5-10 minutes at a time.        This is a list of the screening recommended for you and due dates:  Health Maintenance  Topic Date Due   COVID-19 Vaccine (6 - 2024-25 season) 02/20/2023   Medicare Annual Wellness Visit  12/13/2023   Flu Shot  01/20/2024   Pneumococcal Vaccine for age over 38  Completed   DEXA scan (bone density measurement)  Completed   Zoster (Shingles) Vaccine  Completed   Hepatitis B Vaccine  Aged Out   HPV Vaccine  Aged Out   Meningitis B Vaccine  Aged Out   DTaP/Tdap/Td vaccine  Discontinued    Advanced directives: (Copy Requested) Please bring a copy of your health care power of attorney and living will to the office to be added to your chart at your convenience. You can mail to [facility] [address] [place], [place] [address] or email to [email or web address] Advance Care Planning is important because it:  [x]  Makes sure you receive the medical care that is consistent with your values, goals, and preferences  [x]  It provides guidance to your family and loved  ones and reduces their decisional burden about whether or not they are making the right decisions based on your wishes.  Follow the link provided in your after visit summary or read over the paperwork we have mailed to you to help you started getting your Advance Directives in place. If you need assistance in completing these, please reach out to us  so that we can help you!  See attachments for Preventive Care and Fall Prevention Tips.

## 2023-12-20 ENCOUNTER — Other Ambulatory Visit: Payer: Self-pay | Admitting: Cardiovascular Disease

## 2023-12-27 ENCOUNTER — Telehealth: Payer: Self-pay

## 2023-12-27 NOTE — Telephone Encounter (Signed)
 Copied from CRM [id number]. Topic: General - Other >> Dec 26, 2023  3:04 PM Henretta I wrote: Reason for CRM: Patient would like a callback from Dr. Jodie >> Dec 26, 2023  3:10 PM Mesmerise C wrote: Patient requested phone number for Dr. Jodie advised only have clinic's number provided her with that said she needed to speak to one of the nurses at office patient would like a call back    Called pt but she could not remember what she was calling about

## 2024-01-08 ENCOUNTER — Other Ambulatory Visit: Payer: Self-pay | Admitting: Cardiovascular Disease

## 2024-01-10 ENCOUNTER — Telehealth: Payer: Self-pay | Admitting: Cardiovascular Disease

## 2024-01-10 MED ORDER — VALSARTAN 160 MG PO TABS: 160.0000 mg | ORAL_TABLET | Freq: Every day | ORAL | 2 refills | Status: AC

## 2024-01-10 NOTE — Telephone Encounter (Signed)
 Pt's medication was sent to pt's pharmacy as requested. Confirmation received.

## 2024-01-10 NOTE — Telephone Encounter (Signed)
*  STAT* If patient is at the pharmacy, call can be transferred to refill team.   1. Which medications need to be refilled? (please list name of each medication and dose if known) valsartan  (DIOVAN ) 160 MG tablet    2. Would you like to learn more about the convenience, safety, & potential cost savings by using the [facility]?    3. Are you open to using the [facility] (Type [facility]. ).   4. Which pharmacy/location (including street and city if local pharmacy) is medication to be sent to? [id number] - [place], [place] - [address]    5. Do they need a 30 day or 90 day supply? 90 day

## 2024-01-10 NOTE — Telephone Encounter (Signed)
 I would do that if she has symptoms of hypotension or if she calls to tell me that her blood pressure is low.  But I was not planning a dose change yet.  Am I missing something?

## 2024-01-11 ENCOUNTER — Other Ambulatory Visit: Payer: Self-pay | Admitting: Cardiovascular Disease

## 2024-01-12 IMAGING — CR DG HIP (WITH OR WITHOUT PELVIS) 2-3V*R*
3 series · 3 of 3 positions shown · non-contrast
Comparison: None.

CLINICAL DATA: Hip pain

EXAM:
DG HIP (WITH OR WITHOUT PELVIS) 3V RIGHT

[hip ap]
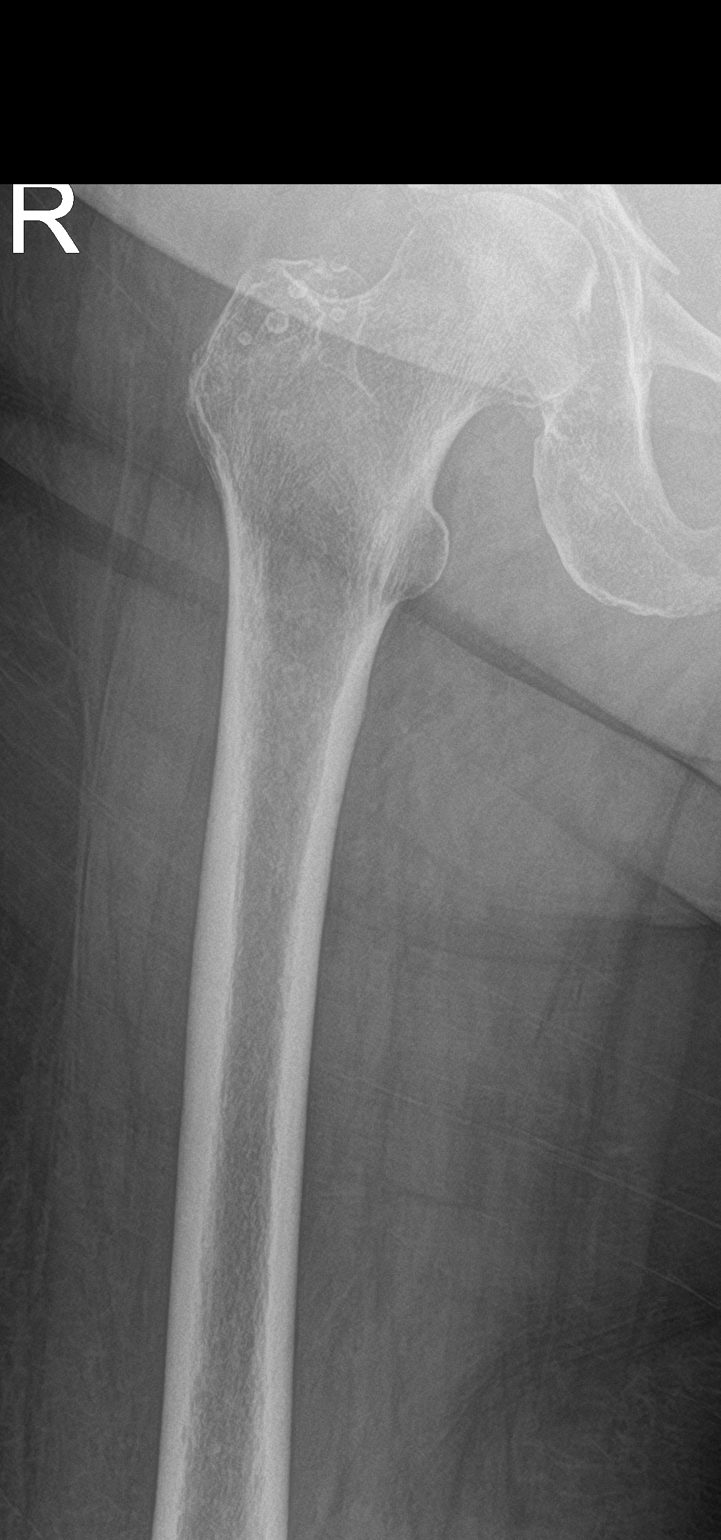

[hip lat]
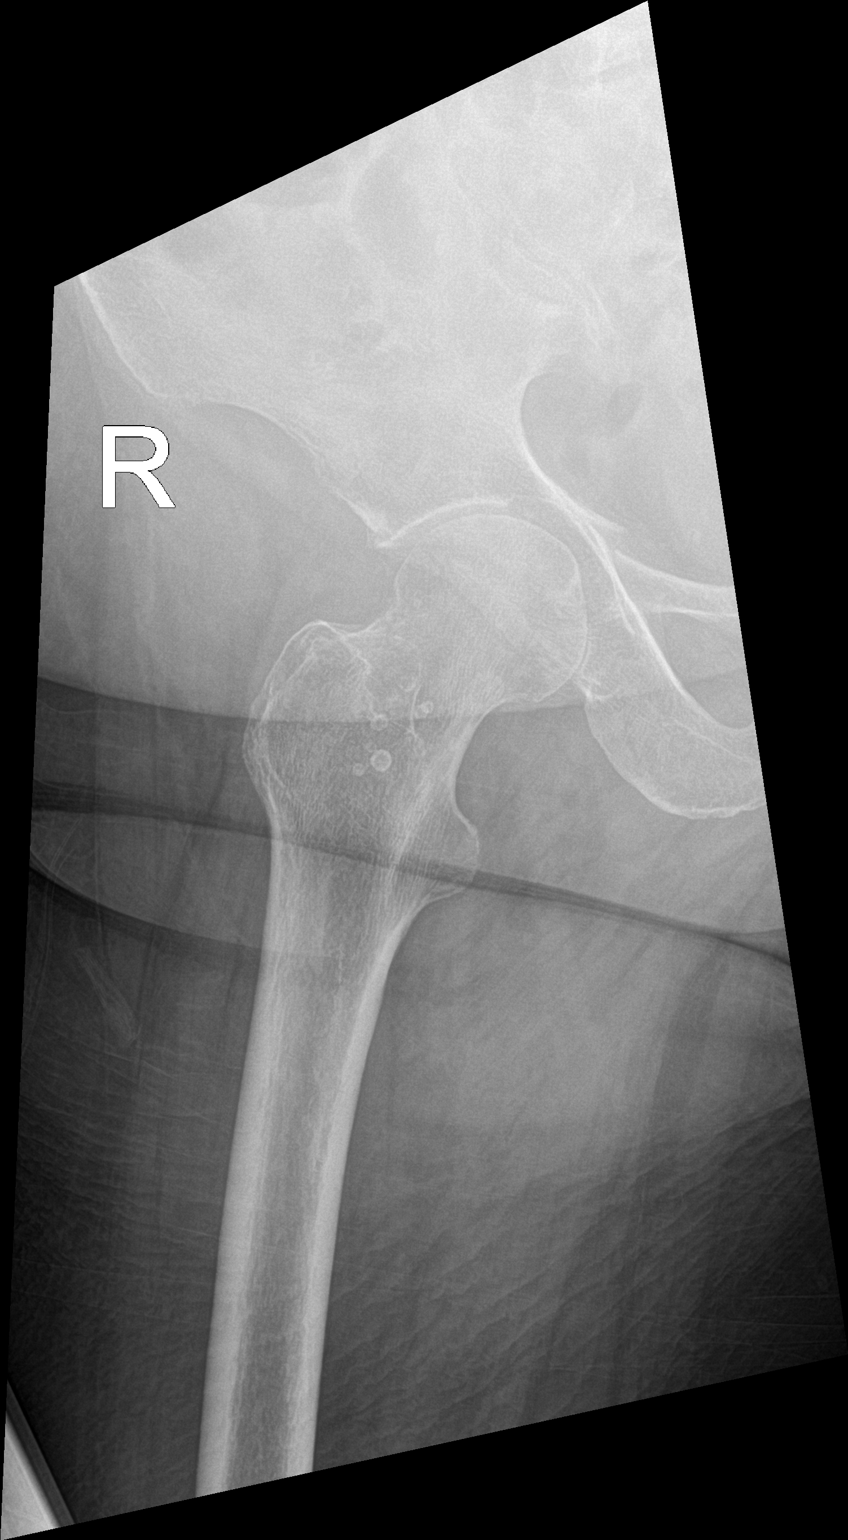

[pelvis ap]
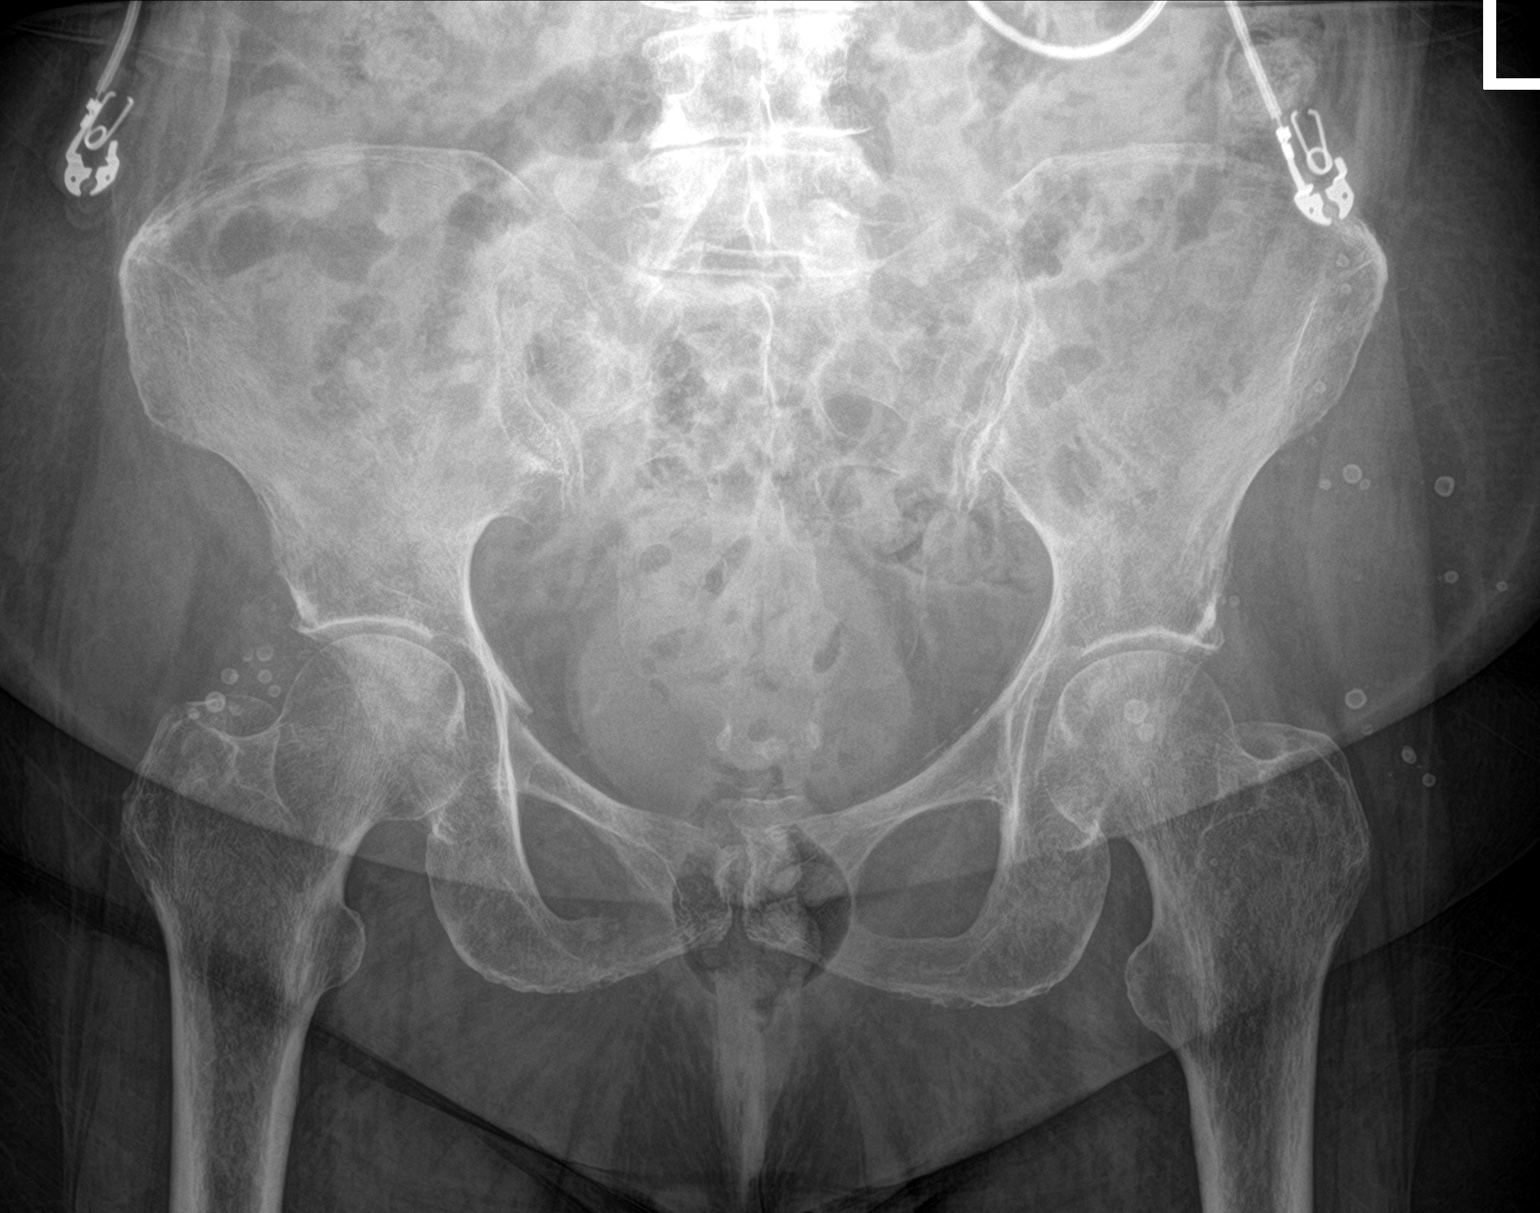

[3 of 3 positions shown; findings below may reference images not displayed]

FINDINGS: Stable deformity of the right superior pubic ramus. Mild
degenerative changes of the bilateral hips, SI joints and pubic
symphysis. Degenerative changes of the partially visualized lower
lumbar spine. Soft tissues are unremarkable.
IMPRESSION: Step-off deformity of the right superior pubic ramus, concerning for
fracture. Recommend further evaluation with cross-sectional imaging.
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
## 2024-01-12 IMAGING — DX DG CHEST 2V
2 series · 2 of 2 positions shown · non-contrast
Comparison: Chest radiographs 08/05/2016 and earlier.

CLINICAL DATA: 89-year-old female with right leg pain. Right hip
pain.

EXAM:
CHEST - 2 VIEW

[chest lat]
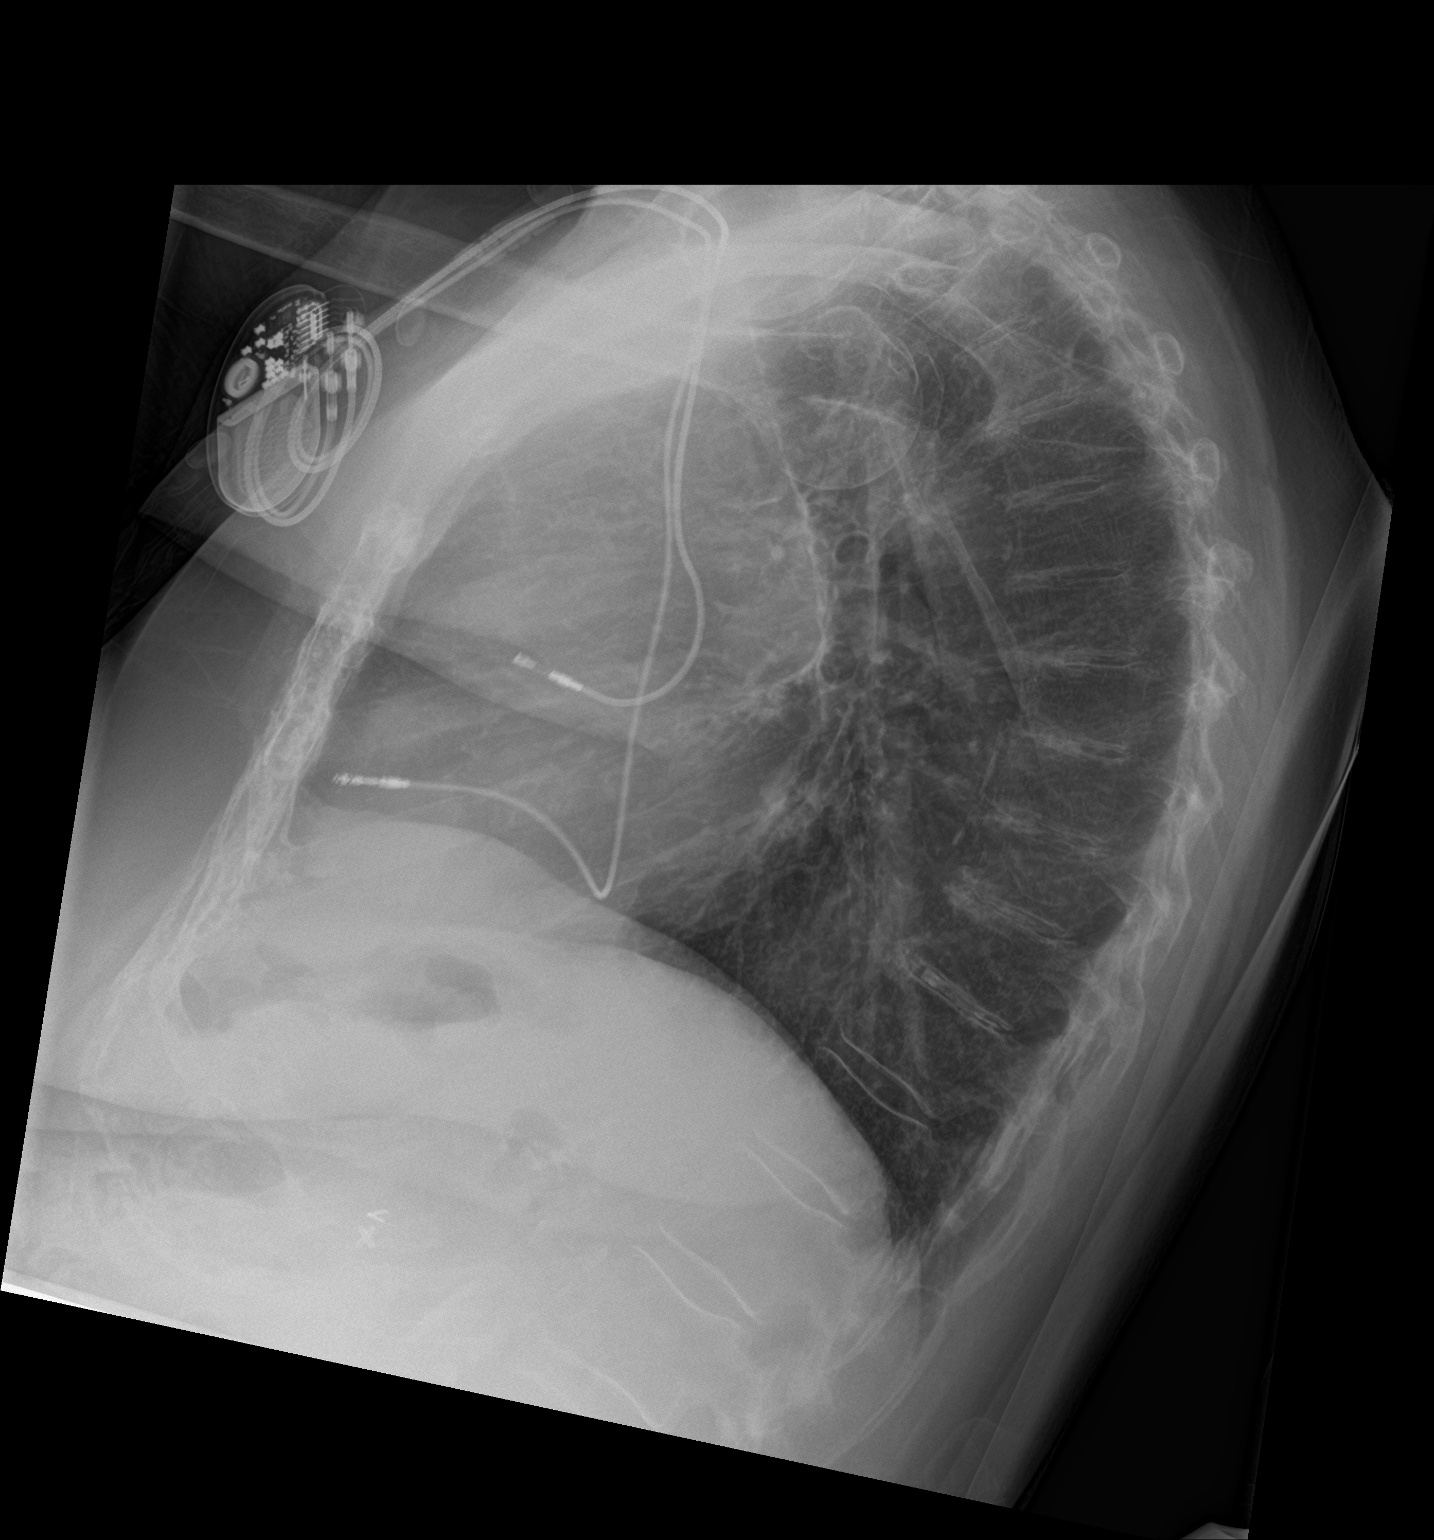

[chest ap]
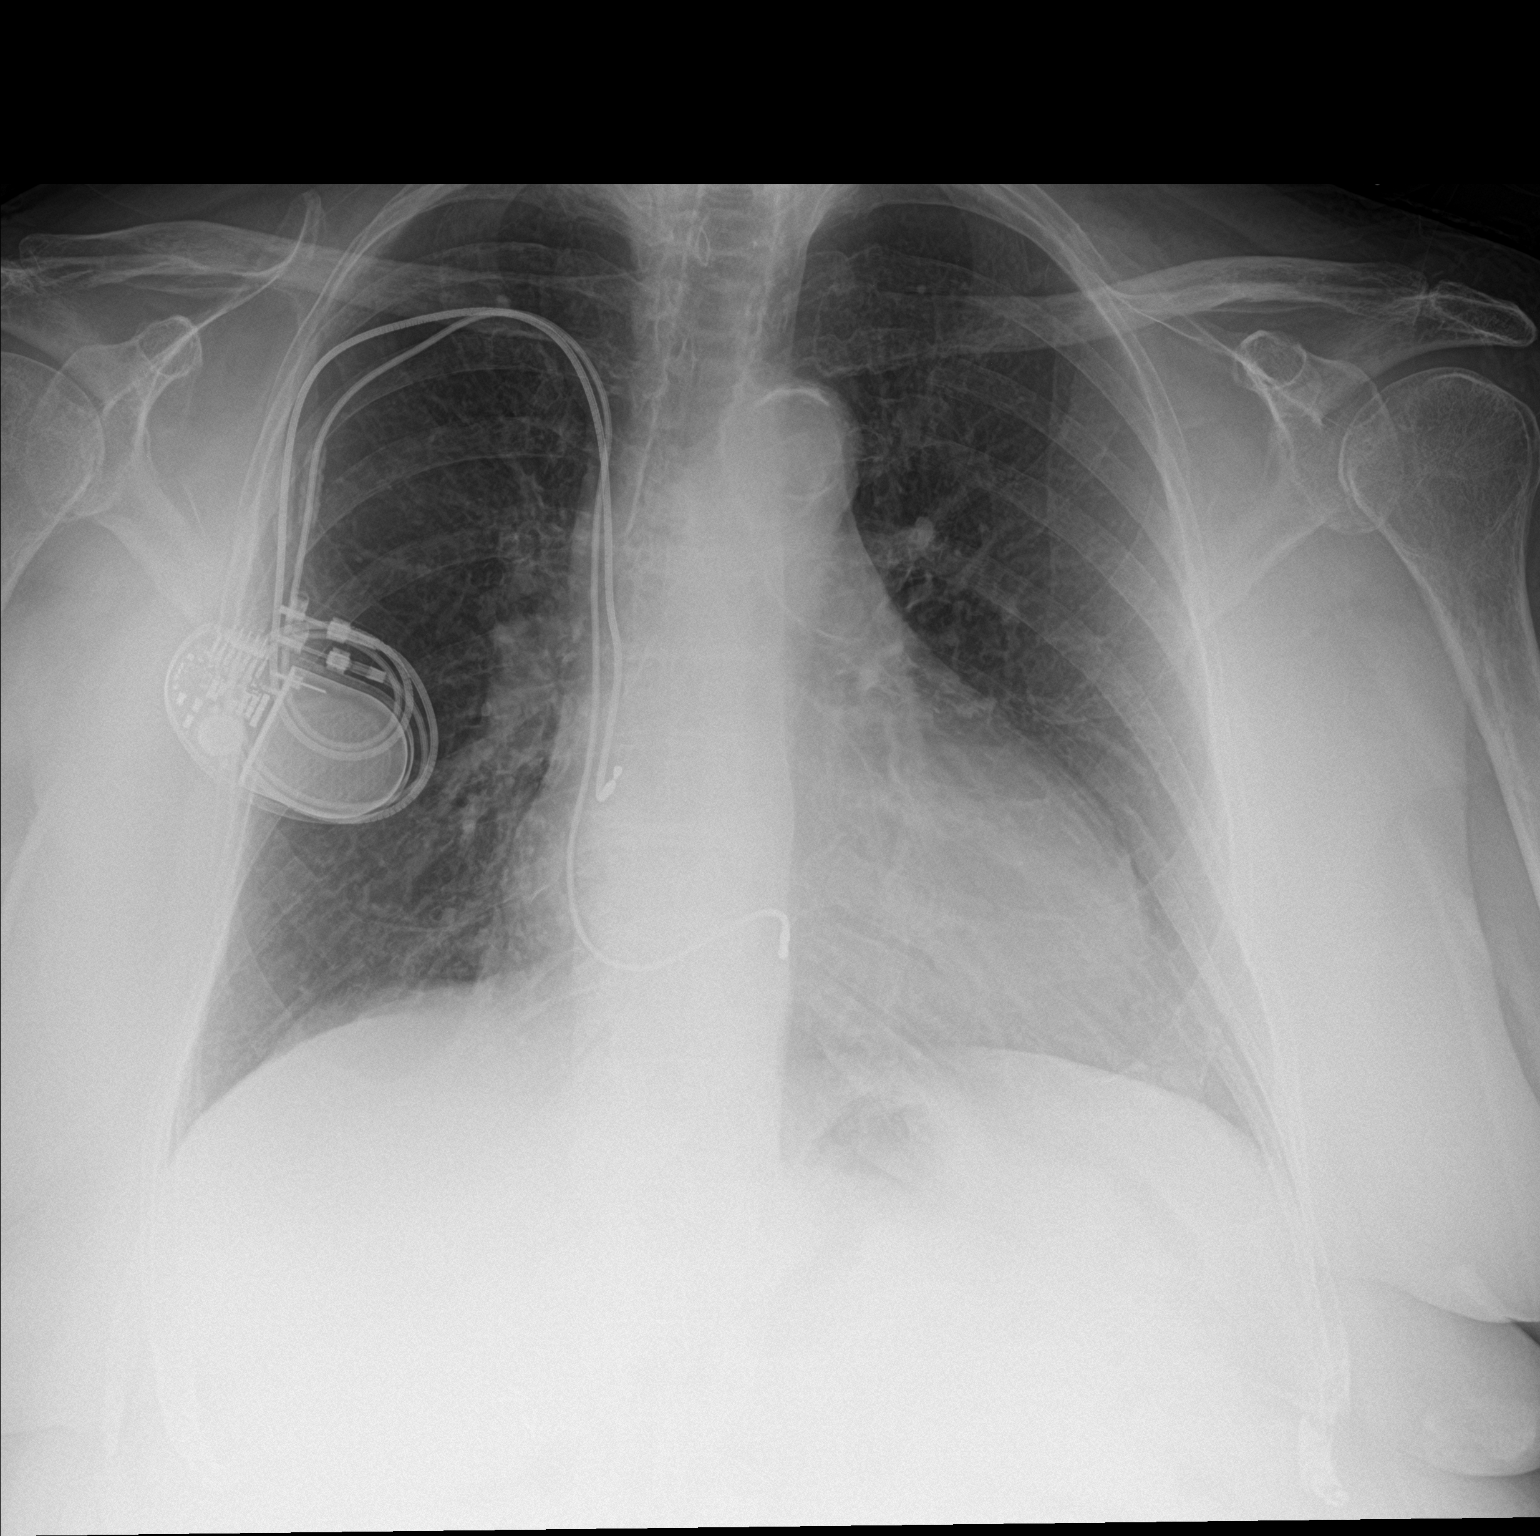

[2 of 2 positions shown; findings below may reference images not displayed]

FINDINGS: Upright AP and lateral views of the chest. Right chest dual lead
cardiac pacemaker is chronic. Stable cardiac size, borderline to
mildly enlarged. Other mediastinal contours are within normal
limits. Visualized tracheal air column is within normal limits. Lung
volumes are stable and normal. Both lungs appear clear. No
pneumothorax or pleural effusion.

Abdominal Calcified aortic atherosclerosis. Stable cholecystectomy
clips. Osteopenia. No acute osseous abnormality identified.
IMPRESSION: No acute cardiopulmonary abnormality.

Aortic Atherosclerosis ([id number]).

## 2024-01-16 ENCOUNTER — Encounter: Payer: Self-pay | Admitting: Podiatry

## 2024-01-16 ENCOUNTER — Ambulatory Visit (INDEPENDENT_AMBULATORY_CARE_PROVIDER_SITE_OTHER): Admitting: Podiatry

## 2024-01-16 DIAGNOSIS — M79675 Pain in left toe(s): Secondary | ICD-10-CM

## 2024-01-16 DIAGNOSIS — M79674 Pain in right toe(s): Secondary | ICD-10-CM | POA: Diagnosis not present

## 2024-01-16 DIAGNOSIS — B351 Tinea unguium: Secondary | ICD-10-CM

## 2024-01-16 NOTE — Progress Notes (Addendum)
  Subjective:  Patient ID: [name], female    DOB: [birth year],  MRN: [id number]  [name] presents to clinic today for painful thick toenails that are difficult to trim. Pain interferes with ambulation. Aggravating factors include wearing enclosed shoe gear. Pain is relieved with periodic professional debridement. She is accompanied by her son on today's visit. Chief Complaint  Patient presents with   RFC    Rm2 Routine foot care/ not diabetic/ Dr. Jodie Last visit February 2025   New problem(s): None.   PCP is Jodie Lavern CROME, MD.  Allergies  Allergen Reactions   Fluticasone -Salmeterol Other (See Comments)    unknown Other reaction(s): tongue/throat symptoms   Sulfa Antibiotics     Other reaction(s): gastric intol   Sulfonamide Derivatives Other (See Comments)    unknown   Tetracycline Hcl    Benzonatate  Other (See Comments)    tongue/throat symptoms in the past but tolerated more recently Other reaction(s): and tongue/throat symptoms in the past (not more recently)   Erythromycin Nausea And Vomiting and Other (See Comments)    Stomach cramps Other reaction(s): gastric intol   Meloxicam Other (See Comments)    Mild dyspnea Other reaction(s): dyspnea   Sulfamethoxazole Other (See Comments)    Gastric intolerance    Review of Systems: Negative except as noted in the HPI.  Objective: No changes noted in today's physical examination. There were no vitals filed for this visit. [name] is a pleasant 89 y.o. female WD, WN in NAD. AAO x 3.  Vascular Examination: Vascular status intact b/l with palpable pedal pulses. CFT immediate b/l. Trace edema noted BLE.No pain with calf compression b/l. Skin temperature gradient WNL b/l.   Neurological Examination: Sensation grossly intact b/l with 10 gram monofilament.   Dermatological Examination: Pedal skin with normal turgor, texture and tone b/l. Toenails 1-5 b/l thick, discolored, elongated with subungual debris and  pain on dorsal palpation. No hyperkeratotic lesions noted b/l.   Musculoskeletal Examination: Muscle strength 5/5 to b/l LE. HAV with bunion deformity noted b/l LE. Patient utilizes walker for mobility assistance.  Radiographs: None  Assessment/Plan: 1. Pain due to onychomycosis of toenails of both feet    Consent given for treatment. Patient examined. All patient's and/or POA's questions/concerns addressed on today's visit. Mycotic toenails 1-5 debrided in length and girth without incident. Continue soft, supportive shoe gear daily. Report any pedal injuries to medical professional. Call office if there are any quesitons/concerns. Patient/POA to call should there be question/concern in the interim.   Return in about 3 months (around 04/17/2024).  Delon CROME Merlin, DPM      [facility] LOCATION: [address] [place] [address]                   Office [phone]   [facility] LOCATION: [address] [place], [place] [address] Office [phone]

## 2024-02-08 DIAGNOSIS — H04123 Dry eye syndrome of bilateral lacrimal glands: Secondary | ICD-10-CM | POA: Diagnosis not present

## 2024-02-08 DIAGNOSIS — D3131 Benign neoplasm of right choroid: Secondary | ICD-10-CM | POA: Diagnosis not present

## 2024-02-13 ENCOUNTER — Ambulatory Visit: Payer: Medicare Other | Admitting: Family Medicine

## 2024-03-06 ENCOUNTER — Encounter: Payer: Self-pay | Admitting: Cardiovascular Disease

## 2024-03-06 ENCOUNTER — Ambulatory Visit: Attending: Cardiovascular Disease | Admitting: Cardiovascular Disease

## 2024-03-06 VITALS — BP 143/63 | HR 74 | Ht <= 58 in | Wt 144.0 lb

## 2024-03-06 DIAGNOSIS — E7800 Pure hypercholesterolemia, unspecified: Secondary | ICD-10-CM | POA: Diagnosis not present

## 2024-03-06 DIAGNOSIS — R7303 Prediabetes: Secondary | ICD-10-CM | POA: Diagnosis not present

## 2024-03-06 DIAGNOSIS — Z950 Presence of cardiac pacemaker: Secondary | ICD-10-CM | POA: Insufficient documentation

## 2024-03-06 DIAGNOSIS — I5032 Chronic diastolic (congestive) heart failure: Secondary | ICD-10-CM | POA: Insufficient documentation

## 2024-03-06 DIAGNOSIS — I442 Atrioventricular block, complete: Secondary | ICD-10-CM | POA: Insufficient documentation

## 2024-03-06 DIAGNOSIS — I495 Sick sinus syndrome: Secondary | ICD-10-CM | POA: Diagnosis not present

## 2024-03-06 DIAGNOSIS — I4719 Other supraventricular tachycardia: Secondary | ICD-10-CM | POA: Diagnosis not present

## 2024-03-06 DIAGNOSIS — I10 Essential (primary) hypertension: Secondary | ICD-10-CM | POA: Insufficient documentation

## 2024-03-06 DIAGNOSIS — N1832 Chronic kidney disease, stage 3b: Secondary | ICD-10-CM | POA: Insufficient documentation

## 2024-03-06 DIAGNOSIS — Z8679 Personal history of other diseases of the circulatory system: Secondary | ICD-10-CM | POA: Diagnosis not present

## 2024-03-06 NOTE — Progress Notes (Signed)
 Patient ID: [name], female   DOB: 19-May-1932, 89 y.o.   MRN: [id number]    Cardiology Office Note    Date:  03/06/2024   ID:  [name], DOB [birth year], MRN [id number]  PCP:  Jodie Lavern CROME, MD  Cardiologist: Debby Sor, M.D.;  Jerel Balding, MD   Chief Complaint  Patient presents with   Pacemaker Check    History of Present Illness:  [name] is a 89 y.o. female who returns in follow-up for sinus node dysfunction, complete heart block, paroxysmal atrial tachycardia and pacemaker.  Although her current pacemaker generator is MRI conditional, she still has original leads from 2009 which are not MRI compatible.  She celebrated her 89th birthday this year.  She has been unable to do remote downloads despite multiple attempts.  She has just received a new transmitter in the mail but has yet to set it up.  In the meantime we are checking her pacemaker in the office every 6 months.  As before, she is quite sedentary but has no cardiovascular complaints. The patient specifically denies any chest pain at rest or with exertion, dyspnea at rest or with exertion, orthopnea, paroxysmal nocturnal dyspnea, syncope, palpitations, focal neurological deficits, intermittent claudication, unexplained weight gain, cough, hemoptysis or wheezing.  She has mild ankle swelling, especially towards the end of the day.  She uses a walker.  She has not had falls.  Systolic blood pressure is borderline high, but her diastolic blood pressure remains quite low.  At home she is sometimes seen in the 40s.  She has had a questionable history of congestive heart failure.  Diuretics were started 2023 when she had complaints of intermittent lower extremity edema, at a time when she was also found to have bilateral superior pubic rami fractures.  She had borderline elevated NT proBNP in the past at 833, subsequently recheck normal at 515, then even lower at 480 and then BNP 35 in August 2024.  Echocardiogram in  June 2023 showed normal left ventricular systolic function and no significant valvular abnormalities. Feels a little stronger after we cut back on her dose of diuretic at her visit in July 2024 and has not had any recurrence of edema.   Comprehensive pacemaker check in the office today shows normal device function.  Presenting rhythm was atrial paced, ventricular paced.  She has a Forensic psychologist MRI generator implanted in 2018, but the system is not MRI conditional since her older leads are not compatible with MRIs.  Estimated generator longevity is 2.1 years.  There is no underlying ventricular rhythm.  She is pacemaker dependent due to complete heart block.,  Show 45% atrial pacing with a satisfactory heart rate histogram distribution and greater than 99% ventricular paced.  She has had very infrequent and brief episodes of atrial tachycardia lasting 6-8 seconds, roughly once a month.  There has been no true atrial fibrillation or high ventricular rates.  Impedance and sensing on both leads and atrial lead capture automatically checked on a daily basis and ventricular lead threshold was manually tested today and found to be unchanged at 0.75 V at 0.4 ms pulse width.  No changes were made to device settings which appear to remain appropriate.  Continues to have excellent metabolic control based on labs checked 07/28/2023 with cholesterol 138, HDL 59, LDL 64 and triglycerides 79.  She has diet-controlled diabetes mellitus with a hemoglobin A1c in prediabetes range at 6.2%.  She has mildly abnormal renal function with a creatinine  at 1.28.  Baseline GFR seems to be around 40.   Past Medical History:  Diagnosis Date   Allergic rhinitis    Anginal pain (HCC)    Asthmatic bronchitis    Chronic cough    Dysrhythmia    PAF   Esophageal reflux    GERD (gastroesophageal reflux disease)    History of stress test 11/20/2009   This was essentially normal,post-stress EF was hyperdynamic at 73 beats per  mins, There was no scar or ischemia.   Hx of echocardiogram 11/05/2010   EF>55% showed a normal systolic function with impaired diastolic dysfuntion and also tissue doppler suggesting increased elevated left atrial pressure, she had moderate LA dilatation and mild LA dilatation. There was calcified mitral apparatus with moderate MR, mild to moderate TR and trace aortic insufficiency.   Hyperlipemia    Hypertension    Hypothyroidism    PAF (paroxysmal atrial fibrillation) (HCC)    Pneumonia     Past Surgical History:  Procedure Laterality Date   CHOLECYSTECTOMY     CYSTOSCOPY WITH BIOPSY N/A 08/29/2020   Procedure: CYSTOSCOPY WITH BIOPSY WITH FULGURATION;  Surgeon: Watt Rush, MD;  Location: [facility];  Service: Urology;  Laterality: N/A;   FOOT SURGERY     right   NASAL SEPTUM SURGERY     PACEMAKER INSERTION  June 1st 2009   [employer] Jude [place] device, model #4173, serial #[id number], The artial lead is a Lexicographer, the ventricular lead is Runner, broadcasting/film/video.   PPM GENERATOR CHANGEOUT N/A 02/09/2017   Procedure: PPM GENERATOR CHANGEOUT;  Surgeon: Francyne Headland, MD;  Location: [facility]AS[facility];  Service: Cardiovascular;  Laterality: N/A;   THYROID  SURGERY     TONSILLECTOMY     TONSILLECTOMY     VESICOVAGINAL FISTULA CLOSURE W/ TAH      Current Outpatient Medications  Medication Sig Dispense Refill   acetaminophen  (TYLENOL ) 500 MG tablet Take 500 mg by mouth as needed for mild pain.     Ascorbic Acid (VITAMIN C) 500 MG CHEW Chew 500 mg by mouth daily.     aspirin  81 MG chewable tablet Chew 81 mg by mouth at bedtime.     cholecalciferol (VITAMIN D3) 25 MCG (1000 UNIT) tablet Take 1 tablet (1,000 Units total) by mouth daily. 30 tablet 0   diltiazem  (CARDIZEM  CD) 120 MG 24 hr capsule Take 1 capsule (120 mg total) by mouth at bedtime. 90 capsule 1   KLOR-CON  M20 20 MEQ tablet TAKE 1 TABLET BY MOUTH DAILY. TAKE AN EXTRA TABLET ONCE DAILY WHEN YOU TAKE THE EXTRA FUROSEMIDE  135 tablet 3    levothyroxine  (SYNTHROID ) 25 MCG tablet TAKE 1 TABLET BY MOUTH EVERY DAY 90 tablet 3   LORazepam  (ATIVAN ) 0.5 MG tablet TAKE 1 TABLET BY MOUTH EVERY DAY AS NEEDED FOR ANXIETY 30 tablet 2   pantoprazole  (PROTONIX ) 20 MG tablet TAKE 1 TABLET BY MOUTH EVERY DAY 30 tablet 11   pravastatin  (PRAVACHOL ) 40 MG tablet TAKE 1 TABLET BY MOUTH EVERY DAY 90 tablet 3   torsemide  (DEMADEX ) 20 MG tablet TAKE 2 TABLETS (40 MG TOTAL) BY MOUTH IN THE MORNING AND 1 TABLET (20 MG TOTAL) EVERY EVENING. 270 tablet 2   valsartan  (DIOVAN ) 160 MG tablet Take 1 tablet (160 mg total) by mouth daily. 90 tablet 2   calcium carbonate (TUMS EX) 750 MG chewable tablet Chew 1 tablet by mouth daily as needed for heartburn. (Patient not taking: Reported on 03/06/2024)     cetirizine (  ZYRTEC) 10 MG tablet Take 10 mg by mouth as needed. (Patient not taking: Reported on 03/06/2024)     No current facility-administered medications for this visit.    Allergies:   Fluticasone -salmeterol, Sulfa antibiotics, Sulfonamide derivatives, Tetracycline hcl, Benzonatate , Erythromycin, Meloxicam, and Sulfamethoxazole   Family History:  The patient's family history includes Cancer in her father; Stroke in her mother and sister.     PHYSICAL EXAM:   VS:  BP (!) 143/63   Pulse 74   Ht 4' 10 (1.473 m)   Wt 144 lb (65.3 kg)   SpO2 96%   BMI 30.10 kg/m      General: Alert, oriented x3, no distress, borderline obese, healthy left subclavian pacemaker site. Head: no evidence of trauma, PERRL, EOMI, no exophtalmos or lid lag, no myxedema, no xanthelasma; normal ears, nose and oropharynx Neck: normal jugular venous pulsations and no hepatojugular reflux; brisk carotid pulses without delay and no carotid bruits Chest: clear to auscultation, no signs of consolidation by percussion or palpation, normal fremitus, symmetrical and full respiratory excursions Cardiovascular: normal position and quality of the apical impulse, regular rhythm, normal first  and second heart sounds, no murmurs, rubs or gallops Abdomen: no tenderness or distention, no masses by palpation, no abnormal pulsatility or arterial bruits, normal bowel sounds, no hepatosplenomegaly Extremities: no clubbing, cyanosis or edema; 2+ radial, ulnar and brachial pulses bilaterally; 2+ right femoral, posterior tibial and dorsalis pedis pulses; 2+ left femoral, posterior tibial and dorsalis pedis pulses; no subclavian or femoral bruits Neurological: grossly nonfocal Psych: Normal mood and affect   HYPERTENSION CONTROL Vitals:   03/06/24 1619 03/06/24 2030  BP: (!) 160/62 (!) 143/63    The patient's blood pressure is elevated above target today.  In order to address the patient's elevated BP: Blood pressure will be monitored at home to determine if medication changes need to be made.       Wt Readings from Last 3 Encounters:  03/06/24 144 lb (65.3 kg)  12/19/23 144 lb (65.3 kg)  10/17/23 144 lb 6.4 oz (65.5 kg)      Studies/Labs Reviewed:   ECHO 07/15/2021   1. Left ventricular ejection fraction, by estimation, is 60 to 65%. The left ventricle has normal function. The left ventricle has no regional wall motion abnormalities. There is mild left ventricular hypertrophy. Left ventricular diastolic parameters are consistent with Grade I diastolic dysfunction (impaired relaxation).   2. Right ventricular systolic function is normal. The right ventricular size is normal. The estimated right ventricular systolic pressure is 31.5 mmHg.   3. Left atrial size was mildly dilated.   4. The mitral valve is normal in structure. Trivial mitral valve  regurgitation. No evidence of mitral stenosis.   5. The aortic valve is tricuspid. Aortic valve regurgitation is not  visualized. No aortic stenosis is present.   6. The inferior vena cava is normal in size with greater than 50%  respiratory variability, suggesting right atrial pressure of 3 mmHg.   Comprehensive check of her  dual-chamber pacemaker and pacemaker in the office today 03/06/2024.  See above   EKG: Most recent ECG from 07/28/2023 is personally reviewed and shows atrial sensed (sinus), ventricular paced rhythm  EKG Interpretation Date/Time:  Tuesday March 06 2024 16:22:21 EDT Ventricular Rate:  74 PR Interval:  178 QRS Duration:  170 QT Interval:  478 QTC Calculation: 530 R Axis:   -32  Text Interpretation: Atrial-sensed ventricular-paced rhythm When compared with ECG of 28-Jul-2023 08:03, Vent. rate has  increased BY   4 BPM Confirmed by Jillianna Stanek [id number]) on 03/06/2024 4:27:31 PM         Recent Labs: 07/28/2023: ALT 9; BUN 24; Creatinine, Ser 1.28; Hemoglobin 10.8; Platelets 238; Potassium 4.6; Sodium 141; TSH 2.280    Lipid Panel    Component Value Date/Time   CHOL 138 07/28/2023 0900   TRIG 79 07/28/2023 0900   HDL 59 07/28/2023 0900   CHOLHDL 2.3 07/28/2023 0900   CHOLHDL 2 04/30/2022 1211   VLDL 20.0 04/30/2022 1211   LDLCALC 64 07/28/2023 0900    ASSESSMENT:    1. Chronic diastolic heart failure (HCC)   2. CHB (complete heart block) (HCC)   3. SSS (sick sinus syndrome) (HCC)   4. Pacemaker   5. PAT (paroxysmal atrial tachycardia) (HCC)   6. History of atrial fibrillation   7. Essential hypertension   8. Hypercholesteremia   9. Prediabetes   10. Stage 3b chronic kidney disease (CKD) (HCC)     PLAN:  In order of problems listed above:  CHF: Clinically euvolemic on a low-dose of loop diuretic.  Higher doses led to symptomatic diastolic hypotension.  Good functional status, but quite sedentary. CHB: Pacemaker dependent without any underlying escape rhythm. SSS: 50% atrial pacing with a decent heart rate histogram distribution.  Current rate response settings appear appropriate. PPM: Still having challenges with remote downloads, until we have satisfactory remote transmissions we will continue in office checks every 6 months.  Otherwise normal device  function.SABRA PAT: Episodes are infrequent and very brief, maximum 8 seconds. AFib: Reported as part of her medical record in the remote past, but had not been seen during pacemaker checks for the last 10 years except for a single episode on 09/07/2021 with possible atrial flutter or atrial fibrillation that lasted for over 3 hours, but unfortunately electrogram could do not be reviewed.  Even if this truly represented atrial flutter or fibrillation, with such a low burden of arrhythmia and with her approaching age 66, we have decided to continue monitoring.  Anticoagulation is not prescribed. HTN: Borderline elevated systolic blood pressure, but she has a tendency to develop very low diastolic blood pressure.  No changes made to her medications. HLP: All the lipid parameters are in target range, continue pravastatin  PreDM: Diet controlled CKD 3B: Monitor creatinine periodically.  Baseline GFR seems to be around 40.   Medication Adjustments/Labs and Tests Ordered: Current medicines are reviewed at length with the patient today.  Concerns regarding medicines are outlined above.  Medication changes, Labs and Tests ordered today are listed below. Patient Instructions  Medication Instructions:  No changes *If you need a refill on your cardiac medications before your next appointment, please call your pharmacy*  Lab Work: None ordered If you have labs (blood work) drawn today and your tests are completely normal, you will receive your results only by: MyChart Message (if you have MyChart) OR A paper copy in the mail If you have any lab test that is abnormal or we need to change your treatment, we will call you to review the results.  Testing/Procedures: None ordered  Follow-Up: At [facility], you and your health needs are our priority.  As part of our continuing mission to provide you with exceptional heart care, our providers are all part of one team.  This team includes your primary  Cardiologist (physician) and Advanced Practice Providers or APPs (Physician Assistants and Nurse Practitioners) who all work together to provide you with the care  you need, when you need it.  Your next appointment:   6 month(s)  Provider:   Dr Francyne  We recommend signing up for the patient portal called MyChart.  Sign up information is provided on this After Visit Summary.  MyChart is used to connect with patients for Virtual Visits (Telemedicine).  Patients are able to view lab/test results, encounter notes, upcoming appointments, etc.  Non-urgent messages can be sent to your provider as well.   To learn more about what you can do with MyChart, go to [email or web address].              Patient Instructions  Medication Instructions:  No changes *If you need a refill on your cardiac medications before your next appointment, please call your pharmacy*  Lab Work: None ordered If you have labs (blood work) drawn today and your tests are completely normal, you will receive your results only by: MyChart Message (if you have MyChart) OR A paper copy in the mail If you have any lab test that is abnormal or we need to change your treatment, we will call you to review the results.  Testing/Procedures: None ordered  Follow-Up: At [facility], you and your health needs are our priority.  As part of our continuing mission to provide you with exceptional heart care, our providers are all part of one team.  This team includes your primary Cardiologist (physician) and Advanced Practice Providers or APPs (Physician Assistants and Nurse Practitioners) who all work together to provide you with the care you need, when you need it.  Your next appointment:   6 month(s)  Provider:   Dr Francyne  We recommend signing up for the patient portal called MyChart.  Sign up information is provided on this After Visit Summary.  MyChart is used to connect with patients for Virtual Visits  (Telemedicine).  Patients are able to view lab/test results, encounter notes, upcoming appointments, etc.  Non-urgent messages can be sent to your provider as well.   To learn more about what you can do with MyChart, go to [email or web address].             Signed, Jerel Francyne, MD  03/06/2024 8:41 PM    [facility] [address] [place], [place], [place]  [address] Phone: [phone]; Fax: [phone]

## 2024-03-06 NOTE — Patient Instructions (Signed)
 Medication Instructions:  No changes *If you need a refill on your cardiac medications before your next appointment, please call your pharmacy*  Lab Work: None ordered If you have labs (blood work) drawn today and your tests are completely normal, you will receive your results only by: MyChart Message (if you have MyChart) OR A paper copy in the mail If you have any lab test that is abnormal or we need to change your treatment, we will call you to review the results.  Testing/Procedures: None ordered  Follow-Up: At [facility], you and your health needs are our priority.  As part of our continuing mission to provide you with exceptional heart care, our providers are all part of one team.  This team includes your primary Cardiologist (physician) and Advanced Practice Providers or APPs (Physician Assistants and Nurse Practitioners) who all work together to provide you with the care you need, when you need it.  Your next appointment:   6 month(s)  Provider:   Dr Francyne  We recommend signing up for the patient portal called MyChart.  Sign up information is provided on this After Visit Summary.  MyChart is used to connect with patients for Virtual Visits (Telemedicine).  Patients are able to view lab/test results, encounter notes, upcoming appointments, etc.  Non-urgent messages can be sent to your provider as well.   To learn more about what you can do with MyChart, go to [email or web address].

## 2024-03-08 ENCOUNTER — Encounter: Payer: Self-pay | Admitting: Cardiovascular Disease

## 2024-03-18 ENCOUNTER — Other Ambulatory Visit: Payer: Self-pay | Admitting: Family Medicine

## 2024-03-18 DIAGNOSIS — E039 Hypothyroidism, unspecified: Secondary | ICD-10-CM

## 2024-03-24 DIAGNOSIS — Z23 Encounter for immunization: Secondary | ICD-10-CM | POA: Diagnosis not present

## 2024-03-26 ENCOUNTER — Ambulatory Visit: Admitting: Family Medicine

## 2024-04-02 ENCOUNTER — Ambulatory Visit (INDEPENDENT_AMBULATORY_CARE_PROVIDER_SITE_OTHER): Admitting: Family Medicine

## 2024-04-02 VITALS — BP 158/53 | HR 82 | Temp 98.8°F | Ht <= 58 in | Wt 142.2 lb

## 2024-04-02 DIAGNOSIS — Z79899 Other long term (current) drug therapy: Secondary | ICD-10-CM | POA: Diagnosis not present

## 2024-04-02 DIAGNOSIS — I10 Essential (primary) hypertension: Secondary | ICD-10-CM | POA: Diagnosis not present

## 2024-04-02 DIAGNOSIS — R5383 Other fatigue: Secondary | ICD-10-CM | POA: Diagnosis not present

## 2024-04-02 DIAGNOSIS — E559 Vitamin D deficiency, unspecified: Secondary | ICD-10-CM | POA: Diagnosis not present

## 2024-04-02 DIAGNOSIS — Z95811 Presence of heart assist device: Secondary | ICD-10-CM

## 2024-04-02 DIAGNOSIS — R600 Localized edema: Secondary | ICD-10-CM | POA: Diagnosis not present

## 2024-04-02 DIAGNOSIS — I4719 Other supraventricular tachycardia: Secondary | ICD-10-CM | POA: Diagnosis not present

## 2024-04-02 DIAGNOSIS — N1831 Chronic kidney disease, stage 3a: Secondary | ICD-10-CM | POA: Diagnosis not present

## 2024-04-02 DIAGNOSIS — R7303 Prediabetes: Secondary | ICD-10-CM

## 2024-04-02 MED ORDER — PANTOPRAZOLE SODIUM 20 MG PO TBEC: 20.0000 mg | DELAYED_RELEASE_TABLET | Freq: Every day | ORAL | 3 refills | Status: AC

## 2024-04-02 NOTE — Progress Notes (Unsigned)
 Subjective  CC:  Chief Complaint  Patient presents with   Hypertension   Hyperlipidemia    HPI: [name] is a 89 y.o. female who presents to the office today to address the problems listed above in the chief complaint. Hypertension f/u:  Discussed the use of AI scribe software for clinical note transcription with the patient, who gave verbal consent to proceed.  History of Present Illness [name] is a 89 year old female who presents for a routine follow-up visit.  Generalized weakness and malaise - Intermittent sensations of feeling 'rubbery' and sometimes terrible - Occasional weakness and trembling - No breathing difficulties - Watery eyes, attributed to possible dehydration - Appetite is 'so-so'; eats when she feels the need  Functional status - Describes herself as 'pretty active' - Able to ambulate around the house and perform light dusting  Lower extremity skin integrity - No recent issues with cellulitis or rash on the legs  Immunization status - Received influenza vaccination on October 4th, 2025  Hematologic concerns - Last blood work performed six months ago - Takes vitamin D  daily - Uncertain about vitamin B12 levels - Son suspects possible anemia  Allergic and respiratory history - History of chest problems and allergies - No current breathing difficulties  Social exposure risk - Rarely goes out except to restaurants - Typically around five to ten people at most  HTN: reviewed recent cards notes; bp systolic is above normal but diastolic runs low; no med changes recommended. Want to avoid near syncope. Likely some autonomic dysfunction as well  Assessment  1. Essential hypertension   2. Presence of heart assist device (HCC)   3. PAT (paroxysmal atrial tachycardia)   4. Stage 3a chronic kidney disease (CKD) (HCC)   5. Lower extremity edema   6. Prediabetes   7. Other fatigue   8. Long-term current use of proton pump inhibitor therapy    9. Vitamin D  deficiency      Plan  Assessment and Plan Assessment & Plan Assessment and Plan Assessment & Plan Fatigue likely age and deconditioning but will r/o other causes with lab work Reports weakness and trembling, suggestive of anemia. Differential diagnosis includes thyroid  dysfunction, kidney issues, or vitamin deficiencies. - Order blood tests for complete blood count, thyroid  function, kidney function, liver function, vitamin D , and B12 levels.  Chronic conditions are well controlled. No med changes today  Discuss vaccine recommendations.  HTN: no changes. Permissive systolic htn.   Follow up: 6 mo for cpe  Orders Placed This Encounter  Procedures   CBC with Differential/Platelet   TSH   Comprehensive metabolic panel with GFR   VITAMIN D  25 Hydroxy (Vit-D Deficiency, Fractures)   Vitamin B12   Hemoglobin A1c   Meds ordered this encounter  Medications   pantoprazole  (PROTONIX ) 20 MG tablet    Sig: Take 1 tablet (20 mg total) by mouth daily.    Dispense:  90 tablet    Refill:  3      BP Readings from Last 3 Encounters:  04/02/24 (!) 158/53  03/06/24 (!) 143/63  09/08/23 (!) 136/50   Wt Readings from Last 3 Encounters:  04/02/24 142 lb 3.2 oz (64.5 kg)  03/06/24 144 lb (65.3 kg)  12/19/23 144 lb (65.3 kg)    Lab Results  Component Value Date   CHOL 138 07/28/2023   CHOL 134 04/30/2022   CHOL 144 04/27/2021   Lab Results  Component Value Date   HDL 59 07/28/2023  HDL 54.80 04/30/2022   HDL 76.10 04/27/2021   Lab Results  Component Value Date   LDLCALC 64 07/28/2023   LDLCALC 59 04/30/2022   LDLCALC 57 04/27/2021   Lab Results  Component Value Date   TRIG 79 07/28/2023   TRIG 100.0 04/30/2022   TRIG 55.0 04/27/2021   Lab Results  Component Value Date   CHOLHDL 2.3 07/28/2023   CHOLHDL 2 04/30/2022   CHOLHDL 2 04/27/2021   No results found for: LDLDIRECT Lab Results  Component Value Date   CREATININE 1.31 (H) 04/02/2024    BUN 24 (H) 04/02/2024   NA 140 04/02/2024   K 4.1 04/02/2024   CL 103 04/02/2024   CO2 29 04/02/2024    The ASCVD Risk score (Arnett DK, et al., 2019) failed to calculate for the following reasons:   The 2019 ASCVD risk score is only valid for ages 76 to 27  I reviewed the patients updated PMH, FH, and SocHx.    Patient Active Problem List   Diagnosis Date Noted   Chronic diastolic heart failure (HCC) 03/03/2022    Priority: High   Stage 3a chronic kidney disease (CKD) (HCC) 04/27/2021    Priority: High   SSS (sick sinus syndrome) (HCC) 06/15/2017    Priority: High   PAT (paroxysmal atrial tachycardia) 06/15/2017    Priority: High   Impaired fasting glucose 07/27/2016    Priority: High   CHB (complete heart block) (HCC) s/p pacemaker 05/17/2013    Priority: High   Pacemaker 02/04/2013    Priority: High   Essential hypertension 02/04/2013    Priority: High   Mixed hyperlipidemia 02/04/2013    Priority: High   Osteopenia 04/27/2021    Priority: Medium    Adjustment disorder with anxiety 04/27/2021    Priority: Medium    Early stage nonexudative age-related macular degeneration of right eye 03/06/2020    Priority: Medium    Early stage nonexudative age-related macular degeneration of left eye 03/06/2020    Priority: Medium    Laryngopharyngeal reflux (LPR) 03/11/2017    Priority: Medium    Primary hypothyroidism 07/27/2016    Priority: Medium    Lower extremity edema 11/22/2014    Priority: Medium    Asthma 02/20/2010    Priority: Medium    GERD (gastroesophageal reflux disease) 10/26/2007    Priority: Medium    Chronic cough 10/26/2007    Priority: Medium    Choroidal nevus of right eye 03/06/2020    Priority: Low   Pseudophakia 03/06/2020    Priority: Low   Vitamin D  deficiency 07/27/2016    Priority: Low   Allergic rhinitis due to pollen 08/21/2010    Priority: Low   Prediabetes 09/11/2023   Hypercholesteremia 09/11/2023   Dyslipidemia 01/04/2023    Depression, major, single episode, mild 01/03/2023   Venous insufficiency of both lower extremities 05/31/2022   Primary insomnia 10/28/2021   Normocytic anemia 07/14/2021    Allergies: Fluticasone -salmeterol, Sulfa antibiotics, Sulfonamide derivatives, Tetracycline hcl, Benzonatate , Erythromycin, Meloxicam, and Sulfamethoxazole  Social History: Patient  reports that she has never smoked. She has never used smokeless tobacco. She reports that she does not drink alcohol and does not use drugs.  Current Meds  Medication Sig   acetaminophen  (TYLENOL ) 500 MG tablet Take 500 mg by mouth as needed for mild pain.   Ascorbic Acid (VITAMIN C) 500 MG CHEW Chew 500 mg by mouth daily.   aspirin  81 MG chewable tablet Chew 81 mg by mouth at bedtime.  calcium carbonate (TUMS EX) 750 MG chewable tablet Chew 1 tablet by mouth daily as needed for heartburn.   cetirizine (ZYRTEC) 10 MG tablet Take 10 mg by mouth as needed.   cholecalciferol (VITAMIN D3) 25 MCG (1000 UNIT) tablet Take 1 tablet (1,000 Units total) by mouth daily.   diltiazem  (CARDIZEM  CD) 120 MG 24 hr capsule Take 1 capsule (120 mg total) by mouth at bedtime.   KLOR-CON  M20 20 MEQ tablet TAKE 1 TABLET BY MOUTH DAILY. TAKE AN EXTRA TABLET ONCE DAILY WHEN YOU TAKE THE EXTRA FUROSEMIDE    levothyroxine  (SYNTHROID ) 25 MCG tablet TAKE 2 TABLETS (50 MCG TOTAL) BY MOUTH DAILY.   LORazepam  (ATIVAN ) 0.5 MG tablet TAKE 1 TABLET BY MOUTH EVERY DAY AS NEEDED FOR ANXIETY   pravastatin  (PRAVACHOL ) 40 MG tablet TAKE 1 TABLET BY MOUTH EVERY DAY   torsemide  (DEMADEX ) 20 MG tablet TAKE 2 TABLETS (40 MG TOTAL) BY MOUTH IN THE MORNING AND 1 TABLET (20 MG TOTAL) EVERY EVENING.   valsartan  (DIOVAN ) 160 MG tablet Take 1 tablet (160 mg total) by mouth daily.   [DISCONTINUED] pantoprazole  (PROTONIX ) 20 MG tablet TAKE 1 TABLET BY MOUTH EVERY DAY    Review of Systems: Cardiovascular: negative for chest pain, palpitations, leg swelling, orthopnea Respiratory:  negative for SOB, wheezing or persistent cough Gastrointestinal: negative for abdominal pain Genitourinary: negative for dysuria or gross hematuria  Objective  Vitals: BP (!) 158/53   Pulse 82   Temp 98.8 F (37.1 C)   Ht 4' 10 (1.473 m)   Wt 142 lb 3.2 oz (64.5 kg)   SpO2 96%   BMI 29.72 kg/m  General: no acute distress , looks good! Psych:  Alert and oriented, normal mood and affect HEENT:  Normocephalic, atraumatic, supple neck  Cardiovascular:  RRR without murmur. Tr edema Respiratory:  Good breath sounds bilaterally, CTAB with normal respiratory effort Skin:  Warm, no rashes Neurologic:   Mental status is normal Commons side effects, risks, benefits, and alternatives for medications and treatment plan prescribed today were discussed, and the patient expressed understanding of the given instructions. Patient is instructed to call or message via MyChart if he/she has any questions or concerns regarding our treatment plan. No barriers to understanding were identified. We discussed Red Flag symptoms and signs in detail. Patient expressed understanding regarding what to do in case of urgent or emergency type symptoms.  Medication list was reconciled, printed and provided to the patient in AVS. Patient instructions and summary information was reviewed with the patient as documented in the AVS. This note was prepared with assistance of Dragon voice recognition software. Occasional wrong-word or sound-a-like substitutions may have occurred due to the inherent limitation

## 2024-04-03 LAB — CBC WITH DIFFERENTIAL/PLATELET
Basophils Absolute: 0.1 K/uL (ref 0.0–0.1)
Basophils Relative: 1.2 % (ref 0.0–3.0)
Eosinophils Absolute: 0.3 K/uL (ref 0.0–0.7)
Eosinophils Relative: 3.4 % (ref 0.0–5.0)
HCT: 33.1 % — ABNORMAL LOW (ref 36.0–46.0)
Hemoglobin: 11.1 g/dL — ABNORMAL LOW (ref 12.0–15.0)
Lymphocytes Relative: 25.2 % (ref 12.0–46.0)
Lymphs Abs: 2 K/uL (ref 0.7–4.0)
MCHC: 33.6 g/dL (ref 30.0–36.0)
MCV: 89.3 fl (ref 78.0–100.0)
Monocytes Absolute: 0.6 K/uL (ref 0.1–1.0)
Monocytes Relative: 7.6 % (ref 3.0–12.0)
Neutro Abs: 5.1 K/uL (ref 1.4–7.7)
Neutrophils Relative %: 62.6 % (ref 43.0–77.0)
Platelets: 216 K/uL (ref 150.0–400.0)
RBC: 3.7 Mil/uL — ABNORMAL LOW (ref 3.87–5.11)
RDW: 13 % (ref 11.5–15.5)
WBC: 8.1 K/uL (ref 4.0–10.5)

## 2024-04-03 LAB — COMPREHENSIVE METABOLIC PANEL WITH GFR
ALT: 8 U/L (ref 0–35)
AST: 13 U/L (ref 0–37)
Albumin: 3.9 g/dL (ref 3.5–5.2)
Alkaline Phosphatase: 58 U/L (ref 39–117)
BUN: 24 mg/dL — ABNORMAL HIGH (ref 6–23)
CO2: 29 meq/L (ref 19–32)
Calcium: 8.8 mg/dL (ref 8.4–10.5)
Chloride: 103 meq/L (ref 96–112)
Creatinine, Ser: 1.31 mg/dL — ABNORMAL HIGH (ref 0.40–1.20)
GFR: 35.89 mL/min — ABNORMAL LOW (ref >60.00)
Glucose, Bld: 116 mg/dL — ABNORMAL HIGH (ref 70–99)
Potassium: 4.1 meq/L (ref 3.5–5.1)
Sodium: 140 meq/L (ref 135–145)
Total Bilirubin: 0.3 mg/dL (ref 0.2–1.2)
Total Protein: 6.8 g/dL (ref 6.0–8.3)

## 2024-04-03 LAB — VITAMIN B12: Vitamin B-12: 322 pg/mL (ref 211–911)

## 2024-04-03 LAB — HEMOGLOBIN A1C: Hgb A1c MFr Bld: 6 % (ref 4.6–6.5)

## 2024-04-03 LAB — VITAMIN D 25 HYDROXY (VIT D DEFICIENCY, FRACTURES): VITD: 44.99 ng/mL (ref 30.00–100.00)

## 2024-04-03 LAB — TSH: TSH: 4.25 u[IU]/mL (ref 0.35–5.50)

## 2024-04-09 ENCOUNTER — Ambulatory Visit: Payer: Self-pay | Admitting: Family Medicine

## 2024-04-09 NOTE — Progress Notes (Signed)
 Call pt: all lab results are stable. No changes are needed at this time

## 2024-05-01 ENCOUNTER — Ambulatory Visit: Admitting: Podiatry

## 2024-05-01 DIAGNOSIS — M79674 Pain in right toe(s): Secondary | ICD-10-CM | POA: Diagnosis not present

## 2024-05-01 DIAGNOSIS — B351 Tinea unguium: Secondary | ICD-10-CM | POA: Diagnosis not present

## 2024-05-01 DIAGNOSIS — M79675 Pain in left toe(s): Secondary | ICD-10-CM | POA: Diagnosis not present

## 2024-05-01 DIAGNOSIS — L820 Inflamed seborrheic keratosis: Secondary | ICD-10-CM | POA: Insufficient documentation

## 2024-05-07 ENCOUNTER — Other Ambulatory Visit: Payer: Self-pay

## 2024-05-09 ENCOUNTER — Ambulatory Visit: Admitting: Family Medicine

## 2024-05-09 ENCOUNTER — Ambulatory Visit: Admitting: Nurse Practitioner

## 2024-05-10 MED ORDER — DILTIAZEM HCL ER COATED BEADS 120 MG PO CP24: 120.0000 mg | ORAL_CAPSULE | Freq: Every evening | ORAL | 3 refills | Status: AC

## 2024-05-11 ENCOUNTER — Encounter: Payer: Self-pay | Admitting: Podiatry

## 2024-05-11 NOTE — Progress Notes (Signed)
  Subjective:  Patient ID: [name], female    DOB: 04-09-1932,  MRN: [id number]  [name] presents to clinic today for painful mycotic toenails x 10 which interfere with daily activities. Pain is relieved with periodic professional debridement.  Her son is present on today's visit. Chief Complaint  Patient presents with   Nail Problem    Thick painful toenails, 3 month follow up    New problem(s): None.   PCP is Jodie Lavern CROME, MD.  Allergies  Allergen Reactions   Fluticasone -Salmeterol Other (See Comments)    unknown Other reaction(s): tongue/throat symptoms   Sulfa Antibiotics     Other reaction(s): gastric intol   Sulfonamide Derivatives Other (See Comments)    unknown   Tetracycline Hcl    Benzonatate  Other (See Comments)    tongue/throat symptoms in the past but tolerated more recently Other reaction(s): and tongue/throat symptoms in the past (not more recently)   Erythromycin Nausea And Vomiting and Other (See Comments)    Stomach cramps Other reaction(s): gastric intol   Meloxicam Other (See Comments)    Mild dyspnea Other reaction(s): dyspnea   Sulfamethoxazole Other (See Comments)    Gastric intolerance    Review of Systems: Negative except as noted in the HPI.  Objective: No changes noted in today's physical examination. There were no vitals filed for this visit. [name] is a pleasant 89 y.o. female WD, WN in NAD. AAO x 3.  Vascular Examination: Vascular status intact b/l with palpable pedal pulses. CFT immediate b/l. Trace edema noted BLE.No pain with calf compression b/l. Skin temperature gradient WNL b/l.   Neurological Examination: Sensation grossly intact b/l with 10 gram monofilament.   Dermatological Examination: Pedal skin with normal turgor, texture and tone b/l. Toenails 1-5 b/l thick, discolored, elongated with subungual debris and pain on dorsal palpation. No hyperkeratotic lesions noted b/l.   Musculoskeletal  Examination: Muscle strength 5/5 to b/l LE. HAV with bunion deformity noted b/l LE. Patient utilizes walker for mobility assistance.  Radiographs: None  Assessment/Plan: 1. Pain due to onychomycosis of toenails of both feet   Patient was evaluated and treated. All patient's and/or POA's questions/concerns addressed on today's visit. Toenails 1-5 b/l debrided in length and girth without incident. Continue soft, supportive shoe gear daily. Report any pedal injuries to medical professional. Call office if there are any questions/concerns. -Patient/POA to call should there be question/concern in the interim.   Return in about 3 months (around 08/01/2024).  Delon CROME Merlin, DPM      [facility] LOCATION: [address], [place] [address]                   Office [phone]   [facility] LOCATION: [address] [place], [place] [address] Office [phone]

## 2024-05-21 ENCOUNTER — Ambulatory Visit: Payer: Self-pay

## 2024-05-21 NOTE — Telephone Encounter (Signed)
 FYI Only or Action Required?: FYI only for provider: appointment scheduled on 12/3.  Patient was last seen in primary care on 04/02/2024 by Jodie Lavern CROME, MD.  Called Nurse Triage reporting Cough.  Symptoms began several weeks ago.  Interventions attempted: OTC medications: cough drops and syrup occasionally.  Symptoms are: unchanged.  Triage Disposition: See PCP When Office is Open (Within 3 Days)  Patient/caregiver understands and will follow disposition?: Yes  Message from [place] F sent at 05/21/2024  1:49 PM EST  Reason for Triage: Patient having cough and runny nose - wants to know if a medication can be sent to pharmacy [phone] (H)   Reason for Disposition  Cough has been present for > 3 weeks  Answer Assessment - Initial Assessment Questions Patient with cough for many weeks now and she is tired and over it. Occasionally productive green sputum. Denies fever, SOB, CP, or dizziness. Denies sore throat, headache, or chest congestion. Gets runny nose with cough, post nasal drip. Cough drops and syrup with minimal improvement Appt with PCP office Weds 12/3- has to check with son if he can bring her. If not she will reach back out. ED/UC precautions understood.   1. ONSET: When did the cough begin?      Couple weeks 2. SEVERITY: How bad is the cough today?      Moderate- over it  3. SPUTUM: Describe the color of your sputum (e.g., none, dry cough; clear, white, yellow, green)     Green occasionally  4. HEMOPTYSIS: Are you coughing up any blood? If Yes, ask: How much? (e.g., flecks, streaks, tablespoons, etc.)     Denies currently 5. DIFFICULTY BREATHING: Are you having difficulty breathing? If Yes, ask: How bad is it? (e.g., mild, moderate, severe)      Denies  6. FEVER: Do you have a fever? If Yes, ask: What is your temperature, how was it measured, and when did it start?     Denies  7. CARDIAC HISTORY: Do you have any history of heart disease?  (e.g., heart attack, congestive heart failure)      Pacer, htn, sick sinus 8. LUNG HISTORY: Do you have any history of lung disease?  (e.g., pulmonary embolus, asthma, emphysema)     asthma 9. PE RISK FACTORS: Do you have a history of blood clots? (or: recent major surgery, recent prolonged travel, bedridden)     denies 10. OTHER SYMPTOMS: Do you have any other symptoms? (e.g., runny nose, wheezing, chest pain)       Runny nose occasionally, coughing up green sputum occasionally.  Protocols used: Cough - Acute Productive-A-AH

## 2024-05-22 NOTE — Telephone Encounter (Signed)
 Appt tomorrow

## 2024-05-23 ENCOUNTER — Ambulatory Visit: Admitting: Physician Assistant

## 2024-05-28 ENCOUNTER — Ambulatory Visit: Admitting: Family Medicine

## 2024-06-06 ENCOUNTER — Ambulatory Visit: Admitting: Family Medicine

## 2024-06-10 ENCOUNTER — Other Ambulatory Visit: Payer: Self-pay | Admitting: Family Medicine

## 2024-06-10 DIAGNOSIS — E039 Hypothyroidism, unspecified: Secondary | ICD-10-CM

## 2024-08-08 ENCOUNTER — Ambulatory Visit: Admitting: Family Medicine

## 2024-08-14 ENCOUNTER — Ambulatory Visit: Admitting: Podiatry

## 2024-10-10 ENCOUNTER — Ambulatory Visit: Admitting: Family Medicine

## 2024-12-20 ENCOUNTER — Ambulatory Visit
